# Patient Record
Sex: Male | Born: 1940 | Race: White | Hispanic: No | Marital: Married | Smoking: Current every day smoker
Health system: Southern US, Community
[De-identification: ages and names within clinical notes are randomized; demographics above are authoritative.]

## PROBLEM LIST (undated history)

## (undated) DIAGNOSIS — K5790 Diverticulosis of intestine, part unspecified, without perforation or abscess without bleeding: Secondary | ICD-10-CM

## (undated) DIAGNOSIS — I2510 Atherosclerotic heart disease of native coronary artery without angina pectoris: Secondary | ICD-10-CM

## (undated) DIAGNOSIS — K635 Polyp of colon: Secondary | ICD-10-CM

## (undated) DIAGNOSIS — I5022 Chronic systolic (congestive) heart failure: Secondary | ICD-10-CM

## (undated) DIAGNOSIS — I255 Ischemic cardiomyopathy: Secondary | ICD-10-CM

## (undated) DIAGNOSIS — D126 Benign neoplasm of colon, unspecified: Secondary | ICD-10-CM

## (undated) DIAGNOSIS — K219 Gastro-esophageal reflux disease without esophagitis: Secondary | ICD-10-CM

## (undated) DIAGNOSIS — E785 Hyperlipidemia, unspecified: Secondary | ICD-10-CM

## (undated) DIAGNOSIS — H269 Unspecified cataract: Secondary | ICD-10-CM

## (undated) DIAGNOSIS — I6529 Occlusion and stenosis of unspecified carotid artery: Secondary | ICD-10-CM

## (undated) DIAGNOSIS — J449 Chronic obstructive pulmonary disease, unspecified: Secondary | ICD-10-CM

## (undated) DIAGNOSIS — N400 Enlarged prostate without lower urinary tract symptoms: Secondary | ICD-10-CM

## (undated) DIAGNOSIS — G2581 Restless legs syndrome: Secondary | ICD-10-CM

## (undated) DIAGNOSIS — Z860100 Personal history of colon polyps, unspecified: Secondary | ICD-10-CM

## (undated) DIAGNOSIS — I719 Aortic aneurysm of unspecified site, without rupture: Secondary | ICD-10-CM

## (undated) DIAGNOSIS — I509 Heart failure, unspecified: Secondary | ICD-10-CM

## (undated) DIAGNOSIS — M359 Systemic involvement of connective tissue, unspecified: Secondary | ICD-10-CM

## (undated) DIAGNOSIS — R011 Cardiac murmur, unspecified: Secondary | ICD-10-CM

## (undated) DIAGNOSIS — I10 Essential (primary) hypertension: Secondary | ICD-10-CM

## (undated) HISTORY — DX: Chronic obstructive pulmonary disease, unspecified: J44.9

## (undated) HISTORY — DX: Diverticulosis of intestine, part unspecified, without perforation or abscess without bleeding: K57.90

## (undated) HISTORY — DX: Atherosclerotic heart disease of native coronary artery without angina pectoris: I25.10

## (undated) HISTORY — PX: HERNIA REPAIR: SHX51

## (undated) HISTORY — DX: Hyperlipidemia, unspecified: E78.5

## (undated) HISTORY — DX: Benign prostatic hyperplasia without lower urinary tract symptoms: N40.0

## (undated) HISTORY — DX: Benign neoplasm of colon, unspecified: D12.6

## (undated) HISTORY — DX: Polyp of colon: K63.5

## (undated) HISTORY — DX: Heart failure, unspecified: I50.9

## (undated) HISTORY — DX: Personal history of colon polyps, unspecified: Z86.0100

## (undated) HISTORY — DX: Unspecified cataract: H26.9

## (undated) HISTORY — PX: CORONARY ANGIOPLASTY: SHX604

## (undated) HISTORY — DX: Essential (primary) hypertension: I10

## (undated) HISTORY — DX: Cardiac murmur, unspecified: R01.1

## (undated) HISTORY — PX: CHOLECYSTECTOMY: SHX55

## (undated) HISTORY — DX: Aortic aneurysm of unspecified site, without rupture: I71.9

## (undated) HISTORY — DX: Personal history of colonic polyps: Z86.010

## (undated) HISTORY — DX: Gastro-esophageal reflux disease without esophagitis: K21.9

---

## 1998-08-12 ENCOUNTER — Encounter: Payer: Self-pay | Admitting: General Surgery

## 1998-08-14 ENCOUNTER — Ambulatory Visit (HOSPITAL_COMMUNITY): Admission: RE | Admit: 1998-08-14 | Discharge: 1998-08-14 | Payer: Self-pay | Admitting: General Surgery

## 2001-02-21 ENCOUNTER — Encounter: Payer: Self-pay | Admitting: Family Medicine

## 2001-02-21 ENCOUNTER — Encounter: Payer: Self-pay | Admitting: Gastroenterology

## 2001-02-22 ENCOUNTER — Encounter: Payer: Self-pay | Admitting: Internal Medicine

## 2001-02-22 ENCOUNTER — Inpatient Hospital Stay (HOSPITAL_COMMUNITY): Admission: EM | Admit: 2001-02-22 | Discharge: 2001-02-28 | Payer: Self-pay | Admitting: Emergency Medicine

## 2001-02-22 ENCOUNTER — Encounter (INDEPENDENT_AMBULATORY_CARE_PROVIDER_SITE_OTHER): Payer: Self-pay | Admitting: *Deleted

## 2001-02-25 ENCOUNTER — Encounter: Payer: Self-pay | Admitting: General Surgery

## 2008-04-02 ENCOUNTER — Ambulatory Visit: Payer: PRIVATE HEALTH INSURANCE | Admitting: Ophthalmology

## 2008-04-09 ENCOUNTER — Ambulatory Visit: Payer: Self-pay | Admitting: Vascular Surgery

## 2008-04-17 ENCOUNTER — Ambulatory Visit: Payer: Self-pay | Admitting: Vascular Surgery

## 2008-04-17 ENCOUNTER — Encounter: Payer: Self-pay | Admitting: Vascular Surgery

## 2008-04-17 ENCOUNTER — Inpatient Hospital Stay (HOSPITAL_COMMUNITY): Admission: RE | Admit: 2008-04-17 | Discharge: 2008-04-18 | Payer: Self-pay | Admitting: Vascular Surgery

## 2008-04-17 HISTORY — PX: CAROTID ENDARTERECTOMY: SUR193

## 2008-05-07 ENCOUNTER — Encounter (INDEPENDENT_AMBULATORY_CARE_PROVIDER_SITE_OTHER): Payer: Self-pay | Admitting: *Deleted

## 2008-05-07 ENCOUNTER — Ambulatory Visit: Payer: Self-pay | Admitting: Vascular Surgery

## 2008-05-07 ENCOUNTER — Ambulatory Visit: Payer: Self-pay | Admitting: Family Medicine

## 2008-05-07 DIAGNOSIS — R519 Headache, unspecified: Secondary | ICD-10-CM | POA: Insufficient documentation

## 2008-05-07 DIAGNOSIS — Z860100 Personal history of colon polyps, unspecified: Secondary | ICD-10-CM | POA: Insufficient documentation

## 2008-05-07 DIAGNOSIS — I6523 Occlusion and stenosis of bilateral carotid arteries: Secondary | ICD-10-CM | POA: Insufficient documentation

## 2008-05-07 DIAGNOSIS — F17200 Nicotine dependence, unspecified, uncomplicated: Secondary | ICD-10-CM

## 2008-05-07 DIAGNOSIS — I739 Peripheral vascular disease, unspecified: Secondary | ICD-10-CM

## 2008-05-07 DIAGNOSIS — B351 Tinea unguium: Secondary | ICD-10-CM

## 2008-05-07 DIAGNOSIS — E785 Hyperlipidemia, unspecified: Secondary | ICD-10-CM | POA: Insufficient documentation

## 2008-05-07 DIAGNOSIS — A630 Anogenital (venereal) warts: Secondary | ICD-10-CM

## 2008-05-07 DIAGNOSIS — R51 Headache: Secondary | ICD-10-CM

## 2008-05-07 DIAGNOSIS — Z8679 Personal history of other diseases of the circulatory system: Secondary | ICD-10-CM | POA: Insufficient documentation

## 2008-05-09 ENCOUNTER — Ambulatory Visit: Payer: Self-pay | Admitting: Family Medicine

## 2008-05-14 DIAGNOSIS — R7303 Prediabetes: Secondary | ICD-10-CM

## 2008-05-15 LAB — CONVERTED CEMR LAB
ALT: 15 units/L (ref 0–53)
AST: 17 units/L (ref 0–37)
Albumin: 3.8 g/dL (ref 3.5–5.2)
Alkaline Phosphatase: 68 units/L (ref 39–117)
BUN: 18 mg/dL (ref 6–23)
Bilirubin, Direct: 0.1 mg/dL (ref 0.0–0.3)
CO2: 29 meq/L (ref 19–32)
Calcium: 9 mg/dL (ref 8.4–10.5)
Chloride: 105 meq/L (ref 96–112)
Cholesterol: 276 mg/dL (ref 0–200)
Creatinine, Ser: 0.8 mg/dL (ref 0.4–1.5)
Direct LDL: 176.9 mg/dL
GFR calc Af Amer: 124 mL/min
GFR calc non Af Amer: 103 mL/min
Glucose, Bld: 106 mg/dL — ABNORMAL HIGH (ref 70–99)
HDL: 36.2 mg/dL — ABNORMAL LOW (ref >39.0)
PSA: 0.83 ng/mL (ref 0.10–4.00)
Potassium: 4.1 meq/L (ref 3.5–5.1)
Sodium: 140 meq/L (ref 135–145)
Total Bilirubin: 0.5 mg/dL (ref 0.3–1.2)
Total CHOL/HDL Ratio: 7.6
Total Protein: 6.8 g/dL (ref 6.0–8.3)
Triglycerides: 195 mg/dL — ABNORMAL HIGH (ref 0–149)
VLDL: 39 mg/dL (ref 0–40)

## 2008-05-30 ENCOUNTER — Ambulatory Visit: Payer: Self-pay | Admitting: Vascular Surgery

## 2008-05-30 ENCOUNTER — Encounter: Payer: Self-pay | Admitting: Vascular Surgery

## 2008-05-30 ENCOUNTER — Inpatient Hospital Stay (HOSPITAL_COMMUNITY): Admission: RE | Admit: 2008-05-30 | Discharge: 2008-05-31 | Payer: Self-pay | Admitting: Vascular Surgery

## 2008-05-30 HISTORY — PX: CAROTID ENDARTERECTOMY: SUR193

## 2008-06-05 ENCOUNTER — Telehealth: Payer: Self-pay | Admitting: Gastroenterology

## 2008-06-05 ENCOUNTER — Ambulatory Visit: Payer: Self-pay | Admitting: Family Medicine

## 2008-06-05 DIAGNOSIS — D126 Benign neoplasm of colon, unspecified: Secondary | ICD-10-CM

## 2008-06-05 DIAGNOSIS — N400 Enlarged prostate without lower urinary tract symptoms: Secondary | ICD-10-CM | POA: Insufficient documentation

## 2008-06-05 LAB — CONVERTED CEMR LAB
Cholesterol, target level: 200 mg/dL
HDL goal, serum: 40 mg/dL
LDL Goal: 100 mg/dL

## 2008-06-11 ENCOUNTER — Ambulatory Visit: Payer: Self-pay | Admitting: Vascular Surgery

## 2008-06-19 ENCOUNTER — Telehealth: Payer: Self-pay | Admitting: Family Medicine

## 2008-06-20 ENCOUNTER — Ambulatory Visit: Payer: Self-pay | Admitting: Gastroenterology

## 2008-06-20 DIAGNOSIS — J449 Chronic obstructive pulmonary disease, unspecified: Secondary | ICD-10-CM

## 2008-06-24 ENCOUNTER — Encounter: Payer: Self-pay | Admitting: Family Medicine

## 2008-06-26 ENCOUNTER — Ambulatory Visit: Payer: Self-pay | Admitting: Gastroenterology

## 2008-06-26 ENCOUNTER — Encounter: Payer: Self-pay | Admitting: Gastroenterology

## 2008-07-01 ENCOUNTER — Encounter: Payer: Self-pay | Admitting: Gastroenterology

## 2008-08-16 ENCOUNTER — Ambulatory Visit: Payer: Self-pay | Admitting: Family Medicine

## 2008-08-20 LAB — CONVERTED CEMR LAB
ALT: 15 units/L (ref 0–53)
AST: 18 units/L (ref 0–37)
Cholesterol: 114 mg/dL (ref 0–200)
HDL: 38.4 mg/dL — ABNORMAL LOW (ref >39.00)
LDL Cholesterol: 46 mg/dL (ref 0–99)
Total CHOL/HDL Ratio: 3
Triglycerides: 146 mg/dL (ref 0.0–149.0)
VLDL: 29.2 mg/dL (ref 0.0–40.0)

## 2008-08-22 ENCOUNTER — Ambulatory Visit: Payer: Self-pay | Admitting: Family Medicine

## 2008-12-12 ENCOUNTER — Ambulatory Visit: Payer: Self-pay | Admitting: Vascular Surgery

## 2009-02-25 ENCOUNTER — Ambulatory Visit: Payer: Self-pay | Admitting: Family Medicine

## 2009-02-28 LAB — CONVERTED CEMR LAB
Alkaline Phosphatase: 74 units/L (ref 39–117)
Bilirubin, Direct: 0 mg/dL (ref 0.0–0.3)
CO2: 29 meq/L (ref 19–32)
Calcium: 8.8 mg/dL (ref 8.4–10.5)
GFR calc non Af Amer: 89.27 mL/min (ref >60)
HDL: 37.5 mg/dL — ABNORMAL LOW (ref >39.00)
LDL Cholesterol: 57 mg/dL (ref 0–99)
Sodium: 139 meq/L (ref 135–145)
Total Bilirubin: 0.5 mg/dL (ref 0.3–1.2)
Total CHOL/HDL Ratio: 4
VLDL: 37.6 mg/dL (ref 0.0–40.0)

## 2009-03-18 ENCOUNTER — Ambulatory Visit: Payer: Self-pay | Admitting: Family Medicine

## 2009-09-01 ENCOUNTER — Telehealth: Payer: Self-pay | Admitting: Family Medicine

## 2009-12-03 ENCOUNTER — Ambulatory Visit: Payer: Self-pay | Admitting: Vascular Surgery

## 2010-04-16 NOTE — Progress Notes (Signed)
Summary: needs 90 day script for proscar  Phone Note Refill Request Message from:  wife  Refills Requested: Medication #1:  FINASTERIDE 5 MG TABS 1 tab by mouth daily. Please send to [facility].  Initial call taken by: Lowella Petties CMA,  September 01, 2009 4:46 PM    Prescriptions: FINASTERIDE 5 MG TABS (FINASTERIDE) 1 tab by mouth daily  #90 x 3   Entered and Authorized by:   Hannah Beat MD   Signed by:   Hannah Beat MD on 09/01/2009   Method used:   Faxed to ...       [facility] (mail-order)             , [place]         Ph:        Fax: [phone]   RxID:   [id number]

## 2010-04-16 NOTE — Assessment & Plan Note (Signed)
Summary: ROA CYD   Vital Signs:  Patient profile:   70 year old male Height:      67 inches Weight:      142.4 pounds BMI:     22.38 Temp:     98.1 degrees F oral Pulse rate:   76 / minute Pulse rhythm:   regular BP sitting:   90 / 60  (left arm) Cuff size:   regular  Vitals Entered By: Benny Lennert CMA Duncan Dull) (March 18, 2009 12:09 PM)  History of Present Illness: Chief complaint Roa 6 months   BPH, improved on finastreride and flomax...only 1 episode a night of urination. No urinary retention.   Lipid Management History:      Positive NCEP/ATP III risk factors include male age 23 years old or older, HDL cholesterol less than 40, current tobacco user, and peripheral vascular disease.        The patient states that he knows about the "Therapeutic Lifestyle Change" diet.  His compliance with the TLC diet is fair.  The patient expresses understanding of adjunctive measures for cholesterol lowering.  Adjunctive measures started by the patient include aerobic exercise, fiber, and limit alcohol consumpton.  He expresses no side effects from his lipid-lowering medication.  The patient denies any symptoms to suggest myopathy or liver disease.     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 Take 1 Tablet By Mouth Once A Day 2)  Flomax 0.4 Mg Xr24h-Cap (Tamsulosin Hcl) .Marland Kitchen.. 1 Tab By Mouth Daily 3)  Aspirin 81 Mg  Tabs (Aspirin) .... Take 1 Tablet By Mouth Once A Day 4)  Finasteride 5 Mg Tabs (Finasteride) .Marland Kitchen.. 1 Tab By Mouth Daily  Allergies: 1)  ! Codeine  Past History:  Past medical, surgical, family and social histories (including risk factors) reviewed, and no changes noted (except as noted below).  Past Medical History: Reviewed history from 05/07/2008 and no changes required. Current Problems:  HEART MURMUR, HX OF (ICD-V12.50) VENEREAL WART (ICD-078.11) HYPERLIPIDEMIA (ICD-272.4) HEADACHE (ICD-784.0) COLONIC POLYPS, HX OF (ICD-V12.72)    Past Surgical History: Reviewed history from 05/07/2008 and no changes required. Hernia repair Colonoscopy (colon punctured during procedure Carotid Endarterectomy Cholecystectomy  Family History: Reviewed history from 05/07/2008 and no changes required. Family History of Arthritis, other blood relative Family History High cholesterol, other blood relative Family History Hypertension, other blood relative Family History of Heart Disease, brother, MI age 79.  Social History: Reviewed history from 06/20/2008 and no changes required. Current smoker 40 year history 1 and 1/2 a day, has decrease to 1/2 a pack a day.  Alcohol use-no Drug use-no Regular  exercise-no Sleeps 6-7 hours per night 2 people living at the residence Occupation: Retired Smoking Status:  current  Review of Systems General:  Denies chills, fatigue, and fever. CV:  Denies chest pain or discomfort. Resp:  Denies shortness of breath. GI:  Denies abdominal pain, bloody stools, constipation, and diarrhea. GU:  Denies dysuria and  urinary frequency.  Physical Exam  General:  thin appearing male in NAd, smells of smoke Mouth:  Oral mucosa and oropharynx without lesions or exudates.  Teeth in good repair. Neck:  well healing scars over b carotids, no bruit, no thyromegaly Lungs:  Normal respiratory effort, chest expands symmetrically. Lungs are clear to auscultation, no crackles or wheezes, slightly tympanic Heart:  Normal rate and regular rhythm. S1 and S2 normal without gallop, murmur, click, rub or other extra sounds. Pulses:  2 plus post tibilais Extremities:  No clubbing, cyanosis, edema, or deformity noted with normal full range of motion of all joints.     Impression & Recommendations:  Problem # 1:  HYPERLIPIDEMIA (ICD-272.4) Well controlled except triglycerides elevated..will start fish oil daily to decrease CV risk as well. Recheck fasting lipids, AST, ALT  in 6 months  His updated medication list for this problem includes:    Vytorin 10-40 Mg Tabs (Ezetimibe-simvastatin) .Marland Kitchen... Take 1 tablet by mouth once a day  Problem # 2:  BENIGN PROSTATIC HYPERTROPHY, WITH URINARY OBSTRUCTION (ICD-600.01) Improved symtpoms on finasteride and flowmax.He is  pleased wuth results.  Problem # 3:  PREDIABETES (ICD-790.29) Improving..continue work on decreasing carbohydrates and concentrated sweets. increase fiber in foods.  Increase exercsie as tolerated.  Problem # 4:  CAROTID ARTERY STENOSIS, BILATERAL (ICD-433.10) Has CAD risk equivalent.. EKG last year stable and no chest pain, but pt exerts himself minimally. Recommended eval of heart with stress test to make sure no blockage given high risk.Traven Davids not interested in this time.  Counsled again x 3 min on smoking cessation..he is precontemplative.  His updated medication list for this problem includes:    Aspirin 81 Mg Tabs (Aspirin) .Marland Kitchen... Take 1 tablet by mouth once a day  Complete Medication List: 1)  Vytorin 10-40 Mg Tabs (Ezetimibe-simvastatin) ....  Take 1 tablet by mouth once a day 2)  Flomax 0.4 Mg Xr24h-cap (Tamsulosin hcl) .Marland Kitchen.. 1 tab by mouth daily 3)  Aspirin 81 Mg Tabs (Aspirin) .... Take 1 tablet by mouth once a day 4)  Finasteride 5 Mg Tabs (Finasteride) .Marland Kitchen.. 1 tab by mouth daily  Lipid Assessment/Plan:      Based on NCEP/ATP III, the patient's risk factor category is "history of coronary disease, peripheral vascular disease, cerebrovascular disease, or aortic aneurysm along with either diabetes, current smoker, or LDL > 130 plus HDL < 40 plus triglycerides > 200".  The patient's lipid goals are as follows: Total cholesterol goal is 200; LDL cholesterol goal is 100; HDL cholesterol goal is 40; Triglyceride goal is 150.  His LDL cholesterol goal has been met.    Patient Instructions: 1)  Fish oil (omega 3 Fatty ACIDs) 2000 mg divided daily. 2)  Lemon/orange.   3)  Please schedule a follow-up appointment in 6 months  CPX. 4)  Fasting lipids, CMET 6 months.   Current Allergies (reviewed today): ! CODEINE

## 2010-06-25 LAB — CBC
HCT: 30.2 % — ABNORMAL LOW (ref 39.0–52.0)
HCT: 41.2 % (ref 39.0–52.0)
Hemoglobin: 14 g/dL (ref 13.0–17.0)
Platelets: 319 10*3/uL (ref 150–400)
WBC: 9.3 10*3/uL (ref 4.0–10.5)
WBC: 9.8 10*3/uL (ref 4.0–10.5)

## 2010-06-25 LAB — COMPREHENSIVE METABOLIC PANEL
Alkaline Phosphatase: 99 U/L (ref 39–117)
BUN: 13 mg/dL (ref 6–23)
Chloride: 102 mEq/L (ref 96–112)
Glucose, Bld: 107 mg/dL — ABNORMAL HIGH (ref 70–99)
Potassium: 4.5 mEq/L (ref 3.5–5.1)
Total Bilirubin: 0.5 mg/dL (ref 0.3–1.2)

## 2010-06-25 LAB — PROTIME-INR
INR: 1.1 (ref 0.00–1.49)
Prothrombin Time: 14.1 seconds (ref 11.6–15.2)

## 2010-06-25 LAB — URINALYSIS, ROUTINE W REFLEX MICROSCOPIC
Bilirubin Urine: NEGATIVE
Hgb urine dipstick: NEGATIVE
Protein, ur: NEGATIVE mg/dL
Urobilinogen, UA: 0.2 mg/dL (ref 0.0–1.0)

## 2010-06-25 LAB — TYPE AND SCREEN: Antibody Screen: NEGATIVE

## 2010-06-25 LAB — BASIC METABOLIC PANEL
Calcium: 7.9 mg/dL — ABNORMAL LOW (ref 8.4–10.5)
Creatinine, Ser: 0.75 mg/dL (ref 0.4–1.5)
GFR calc Af Amer: >60 mL/min (ref >60)

## 2010-06-26 ENCOUNTER — Other Ambulatory Visit: Payer: Self-pay | Admitting: Family Medicine

## 2010-06-29 LAB — URINALYSIS, ROUTINE W REFLEX MICROSCOPIC
Bilirubin Urine: NEGATIVE
Glucose, UA: NEGATIVE mg/dL
Hgb urine dipstick: NEGATIVE
Ketones, ur: NEGATIVE mg/dL
Protein, ur: NEGATIVE mg/dL

## 2010-06-29 LAB — COMPREHENSIVE METABOLIC PANEL
ALT: 33 U/L (ref 0–53)
Alkaline Phosphatase: 78 U/L (ref 39–117)
CO2: 24 mEq/L (ref 19–32)
Calcium: 9.3 mg/dL (ref 8.4–10.5)
Chloride: 104 mEq/L (ref 96–112)
GFR calc non Af Amer: >60 mL/min (ref >60)
Glucose, Bld: 107 mg/dL — ABNORMAL HIGH (ref 70–99)
Sodium: 137 mEq/L (ref 135–145)
Total Bilirubin: 0.4 mg/dL (ref 0.3–1.2)

## 2010-06-29 LAB — ABO/RH: ABO/RH(D): A POS

## 2010-06-29 LAB — CBC
Hemoglobin: 14.2 g/dL (ref 13.0–17.0)
MCHC: 33 g/dL (ref 30.0–36.0)
RBC: 4.9 MIL/uL (ref 4.22–5.81)

## 2010-06-29 LAB — PROTIME-INR: Prothrombin Time: 12.7 seconds (ref 11.6–15.2)

## 2010-06-30 LAB — CBC
HCT: 31.7 % — ABNORMAL LOW (ref 39.0–52.0)
MCV: 86.2 fL (ref 78.0–100.0)
RBC: 3.68 MIL/uL — ABNORMAL LOW (ref 4.22–5.81)
WBC: 11.3 10*3/uL — ABNORMAL HIGH (ref 4.0–10.5)

## 2010-06-30 LAB — BASIC METABOLIC PANEL
Chloride: 102 mEq/L (ref 96–112)
GFR calc Af Amer: >60 mL/min (ref >60)
GFR calc non Af Amer: >60 mL/min (ref >60)
Potassium: 3.8 mEq/L (ref 3.5–5.1)
Sodium: 134 mEq/L — ABNORMAL LOW (ref 135–145)

## 2010-07-28 NOTE — Procedures (Signed)
CAROTID DUPLEX EXAM   INDICATION:  Follow up carotid endarterectomies.   HISTORY:  Diabetes:  No.  Cardiac:  No.  Hypertension:  No.  Smoking:  Yes.  Previous Surgery:  Right carotid endarterectomy on 04/17/2008, left  carotid endarterectomy on 05/30/2008.  CV History:  Currently asymptomatic.  Amaurosis Fugax No, Paresthesias No, Hemiparesis No                                       RIGHT             LEFT  Brachial systolic pressure:         140               142  Brachial Doppler waveforms:         Normal            Normal  Vertebral direction of flow:        Antegrade         Antegrade  DUPLEX VELOCITIES (cm/sec)  CCA peak systolic                   75                83  ECA peak systolic                   124               82  ICA peak systolic                   93                97  ICA end diastolic                   30                23  PLAQUE MORPHOLOGY:                  Heterogeneous     Heterogeneous  PLAQUE AMOUNT:                      Minimal           Minimal  PLAQUE LOCATION:                    CCA               CCA   IMPRESSION:  1. Patent bilateral carotid endarterectomy sites.  No bilateral      internal carotid stenoses noted.  2. No significant change noted when compared to the previous exam on      12/12/2008.   ___________________________________________  [name], M.D.   CH/MEDQ  D:  12/04/2009  T:  12/04/2009  Job:  [id number]

## 2010-07-28 NOTE — Discharge Summary (Signed)
NAMETODD, JELINSKI NO.:  [id number]   MEDICAL RECORD NO.:  [id number]          PATIENT TYPE:  INP   LOCATION:  3306                         FACILITY:  [facility]   PHYSICIAN:  [name], M.D.DATE OF BIRTH:  [birth year]   DATE OF ADMISSION:  04/17/2008  DATE OF DISCHARGE:  04/18/2008                               DISCHARGE SUMMARY   REASON FOR ADMISSION:  Symptomatic right carotid stenosis.   HISTORY:  This is a pleasant 70 year old gentleman who was referred by  Dr. Druscilla Brownie.  He had had an episode of amaurosis fugax in the right  eye.  This prompted duplex scan, which showed bilateral severe carotid  stenoses.  Right carotid endarterectomy was recommended in order to  lower his risk of stroke.  The right side was addressed first as this  was symptomatic.  It was felt that he would ultimately require staged  left carotid endarterectomy in approximately 8 weeks given the severity  of the stenosis.   His past medical history is significant for hypercholesterolemia and a  history of tobacco abuse.   The remainder of his history and physical is as dictated without  addition or deletion.   HOSPITAL COURSE:  The patient was admitted on April 17, 2008, and  underwent uneventful right carotid endarterectomy with Dacron patch  angioplasty.  He had a severe 90% right carotid stenosis with calcific  plaque.  Postoperatively, he awoke neurologically intact.  He was  monitored in the recovery room and then transferred to the step-down  unit.  He did require dopamine initially to maintain a systolic blood  pressure above 100.  This was weaned off by postoperative day #1.  By  postop day #1, he was ambulating, voiding, and eating without  difficulty.  He was neurologically intact.  The incision looked fine and  he was discharged to home.   DISCHARGE DIAGNOSIS:  Symptomatic right carotid stenosis.   PROCEDURES:  Right carotid endarterectomy with Dacron patch  angioplasty  on April 17, 2008.   SECONDARY DIAGNOSES:  Tobacco abuse and hypercholesterolemia.   Discharge is to home, condition is good, and followup is in 2 weeks.      [name], M.D.  Electronically Signed     CSD/MEDQ  D:  04/18/2008  T:  04/18/2008  Job:  [id number]   cc:   [name], MD  Dr. Druscilla Brownie

## 2010-07-28 NOTE — Assessment & Plan Note (Signed)
OFFICE VISIT   Erik [name], Erik [name]  DOB:  Jul 31, 1940                                       12/12/2008  CHART#:[id number]   I saw the patient in the office today for followup of his carotid  disease.  He underwent staged carotid endarterectomies in March of this  year.  He had presented with an episode of amaurosis fugax in the right  eye and was found to have severe bilateral carotid disease.  He  underwent right carotid endarterectomy on 04/17/2008 and then subsequent  left carotid endarterectomy on 05/30/2008.  He comes in for a 6 month  followup visit.  Since I saw him last he has had no history of stroke,  TIAs, expressive or receptive aphasia, or amaurosis fugax.   REVIEW OF SYSTEMS:  He has had no recent chest pain, chest pressure,  palpitations or arrhythmias.  He has had no productive cough,  bronchitis, asthma or wheezing.   PHYSICAL EXAMINATION:  This is a pleasant 70 year old gentleman who  appears his stated age.  Blood pressure is 132/79, heart rate is 84.  Neck incisions are healed nicely.  I do not detect any carotid bruits.  Lungs are clear bilaterally to auscultation.  On cardiac exam he has a  regular rate and rhythm.  Neurologic exam is nonfocal.   Carotid duplex scan shows less than 39% carotid stenosis bilaterally.  There is no evidence of restenosis.  Both vertebral arteries are patent  with normally directed flow.   Overall I am pleased with his progress.  I have recommended a followup  duplex scan in 1 year.  I will see him back at that time.  He knows to  call sooner if he has problems.  In the meantime he knows to continue  taking his aspirin.   [name], M.D.  Electronically Signed   CSD/MEDQ  D:  12/12/2008  T:  12/13/2008  Job:  [id number]

## 2010-07-28 NOTE — Procedures (Signed)
CAROTID DUPLEX EXAM   INDICATION:  Follow up carotid artery disease.   HISTORY:  Diabetes:  No.  Cardiac:  No.  Hypertension:  No.  Smoking:  Yes.  Previous Surgery:  No.  CV History:  No.  Amaurosis Fugax Yes, Paresthesias No, Hemiparesis No.                                       RIGHT             LEFT  Brachial systolic pressure:         180               172  Brachial Doppler waveforms:         WNL               WNL  Vertebral direction of flow:        Antegrade         Antegrade  DUPLEX VELOCITIES (cm/sec)  CCA peak systolic                   48                70  ECA peak systolic                   146               185  ICA peak systolic                   513               502  ICA end diastolic                   182               153  PLAQUE MORPHOLOGY:                  Mixed             Mixed  PLAQUE AMOUNT:                      Severe            Severe  PLAQUE LOCATION:                    Proximal ICA      Proximal ICA   IMPRESSION:  Bilateral internal carotid arteries show evidence of 80-99%  stenosis.   ___________________________________________  [name], M.D.   AS/MEDQ  D:  04/09/2008  T:  04/09/2008  Job:  [id number]

## 2010-07-28 NOTE — Letter (Signed)
May 07, 2008   [name], MD  [address]  [place], [place] [address]   Re:  Erik [name], Erik [name]             DOB:  05/04/1940   Dear Dr. Ermalene Searing,   I saw Erik [name] in the office today for followup after his recent  right carotid endarterectomy.  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   Duplex showed bilateral high-grade carotid stenoses, and the plan was  for staged carotid endarterectomies.  He had the right carotid  endarterectomy with Dacron patch angioplasty on 04/17/08.  He comes in  today for his first follow-up visit.  He is doing quite well and has no  specific complaints.  He has had no focal weakness or paresthesias.  He  has had no further visual disturbances.   PHYSICAL EXAMINATION:  He has a left carotid bruit.  His right neck  incision is healing nicely.  Lungs are clear bilaterally to  auscultation.  On cardiac exam, he has a regular rate and rhythm.  Abdomen is soft and nontender.  He has palpable femoral, dorsalis pedis,  and posterior tibial pulses bilaterally.   I plan on addressing the left carotid stenosis on 03/18.  He has a  greater than 80% left carotid stenosis, and we have recommended left  carotid endarterectomy in order to lower his risk of his stroke.   I did receive a letter concerning the possible need for ABIs or a  Cardiolite.  Currently, he does have some mild claudication symptoms,  but his symptoms are quite tolerable, and he has palpable pedal pulses.   At this point, from my standpoint, he would not require ABIs unless his  symptoms progress significantly.  With respect to potentially needing a  Cardiolite in the future, I will leave that to your discretion.  He  tolerated his previous surgery well, and he has had no recent cardiac  symptoms.   Thanks for allowing me to participate in this  nice patient's care.   [name], M.D.  Electronically Signed   CSD/MEDQ  D:  05/07/2008  T:  05/08/2008  Job:  [id number]

## 2010-07-28 NOTE — Op Note (Signed)
NAMEFRISCO, CORDTS NO.:  [id number]   MEDICAL RECORD NO.:  [id number]          PATIENT TYPE:  INP   LOCATION:  3306                         FACILITY:  [facility]   PHYSICIAN:  [name], M.D.DATE OF BIRTH:  [birth year]   DATE OF PROCEDURE:  05/30/2008  DATE OF DISCHARGE:                               OPERATIVE REPORT   PREOPERATIVE DIAGNOSIS:  Asymptomatic greater than 80% left carotid  stenosis.   POSTOPERATIVE DIAGNOSIS:  Asymptomatic greater than 80% left carotid  stenosis.   PROCEDURE:  Left carotid endarterectomy with Dacron patch angioplasty.   INDICATIONS:  This is a 70 year old gentleman, who I had seen in  consultation in January after an episode of amaurosis fugax in the right  eye.  Duplex scanning showed severe bilateral carotid disease.  He  underwent right carotid endarterectomy on April 17, 2008, for the  symptomatic right carotid stenosis.  He did well from that standpoint  and is brought back for a staged left carotid endarterectomy for the  tight left carotid stenosis.   TECHNIQUE:  The patient was taken to the operating room and received a  general anesthetic.  Arterial line had been placed by Anesthesia.  The  left neck and chest were prepped and draped in the usual sterile  fashion.  After the incision was made along the anterior border of the  sternocleidomastoid, the dissection was carried down to the common  carotid artery, which was dissected free and controlled with a Rumel  tourniquet.  The facial vein was divided between 2-0 silk ties and then  the internal carotid artery was controlled above the plaque with a red  vessel loop.  The external carotid artery and superior thyroid arteries  were controlled.  The patient was then heparinized.  Clamps were then  placed on the internal, then the external within the common carotid  artery.  A longitudinal arteriotomy was made in the common carotid  artery and this was extended  to the plaque into the normal internal  carotid artery.  A 12 shunt was placed into the internal carotid artery  back bled and then placed into the common carotid artery and secured  with Rumel tourniquet.  Flow was reestablished to the shunt.  An  endarterectomy plane was established proximally and the plaque was  sharply divided.  Eversion endarterectomy was performed of the external  carotid artery.  Distally, there was a nice taper in the plaque.  One  tacking suture was placed.  The artery was irrigated with copious  amounts of heparin and dextran, and all loose debris removed.  The  Dacron patch was then sewn using continuous 6-0 Prolene suture.  Prior  to completing the patch closure, the artery was back bled and flushed  appropriately and the anastomosis completed.  Flow was reestablished  first to the external carotid artery and into the internal carotid  artery.  At the completion, there was a good pulse distal to the patch  and a good Doppler signal.  Hemostasis was obtained in the wound and the  heparin was partially reversed with protamine.  The wound  was closed with a deep layer of 3-0 Vicryl.  The platysma was closed  with running 3-0 Vicryl.  The skin was closed with a 4-0 subcuticular  stitch.  A sterile dressing was applied.  The patient tolerated the  procedure well and was transferred to recovery room in satisfactory  condition.  All needle and sponge counts were correct.      [name], M.D.  Electronically Signed     CSD/MEDQ  D:  05/30/2008  T:  05/30/2008  Job:  [id number]   cc:   [name], MD

## 2010-07-28 NOTE — H&P (Signed)
HISTORY AND PHYSICAL EXAMINATION   May 07, 2008   Re:  Erik, [name]             DOB:  07-27-1940   REASON FOR ADMISSION:  Greater than 80% left carotid stenosis.   HISTORY:  This is a pleasant 70 year old gentleman who I had seen  initially in consultation on January 26 concerning bilateral carotid  disease.  He had had an episode of amaurosis fugax in the right eye  which prompted a duplex scan which showed evidence of bilateral carotid  disease.  At that time he noted that in December he had had two episodes  of transient loss of vision in the right eye which lasted briefly and  occurred approximately a week apart.  Since that time he has had no  further episodes.  He was started on aspirin at that time.  Part of his  workup included a carotid duplex scan which was done at [facility] which showed severe bilateral carotid disease.  He had had no  previous history of stroke, TIAs, expressive or receptive aphasia or  amaurosis fugax.   He was admitted on 04/17/2008 to undergo right carotid endarterectomy  for symptomatic right carotid stenosis.  He did well postop and was  discharged on postop day #1.  On his followup visit 05/07/2008 he was  noted to be doing well with no new neurologic symptoms.  We have  arranged now for staged left carotid endarterectomy on March 18 which  will put him approximately 6-8 weeks since his right carotid  endarterectomy.   PAST MEDICAL HISTORY:  Is fairly unremarkable.  He denies any history of  diabetes, hypertension, history of previous myocardial infarction,  history of congestive heart failure or history of COPD.  He had been on  some cholesterol medicine in the past but this had caused some muscle  pain so he was no longer taking this.   FAMILY HISTORY:  His brother had a myocardial infarction at age 53.  He  is unaware of any other history of premature cardiovascular disease.   SOCIAL HISTORY:  He is  married and he has two children.  He had smoked  one to one and a half packs per day of cigarettes.  He began smoking  when he was 26.  He is currently trying to quit.   ALLERGIES:  Codeine.   MEDICATIONS:  Aspirin 325 mg p.o. daily.   REVIEW OF SYSTEMS:  GENERAL:  He has had no recent weight loss, weight  gain, problems with his appetite.  CARDIAC:  He has had no chest pain, chest pressure, palpitations or  arrhythmias.  PULMONARY:  He has had no productive cough, bronchitis, asthma or  wheezing.  GI:  He has had no recent change in his bowel habits and has no history  of peptic ulcer disease.  GU:  He has had no dysuria or frequency.  VASCULAR:  He does have some mild calf claudication bilaterally.  He has  had no history of rest pain and no history of nonhealing ulcers.  He  denies any history of previous DVT or phlebitis.  NEURO:  He has had no dizziness, blackouts, headaches or seizures.  He  occasionally gets headaches.  ORTHO:  He does occasionally get some joint pain.  HEENT:  There has been no change in his hearing recently.  He has had no  further visual disturbance since his initial episodes in December.  HEMATOLOGIC:  He has had  no bleeding problems or clotting disorders.   PHYSICAL EXAMINATION:  General:  This is a pleasant 70 year old  gentleman who appears his stated age.  Vital signs:  His blood pressure  is 120/73, heart rate is 106.  HEENT:  Is unremarkable.  Neck:  Supple.  His right neck incision has healed nicely.  He has a left carotid bruit.  Lungs:  Are clear bilaterally to auscultation.  Cardiac:  He has a  regular rate and rhythm.  Abdomen:  Soft and nontender.  I cannot  palpate an aneurysm.  He has palpable femoral pulses and posterior  tibial pulses bilaterally.  He has weakly palpable dorsalis pedis  pulses.  Both feet are warm and well-perfused without ischemic ulcers.  He has no significant lower extremity swelling.  Neurologic:  Exam is   nonfocal.   Previous duplex scan had shown a greater than 80% left carotid stenosis.  End-diastolic velocity was 153 cm/sec.  He had a tighter stenosis on the  right which has been addressed.   I have recommended left carotid endarterectomy in order to lower his  risk of future stroke.  We have discussed the indications for the  procedure and the potential complications including but not limited to  bleeding, stroke (periprocedural risk 1-2%), nerve injury, MI or other  unpredictable medical problems.  All of his questions were answered.  He  was agreeable to proceed.  He knows to continue taking his aspirin right  up through surgery.   Erik [name]. Erik [name], M.D.  Electronically Signed   CSD/MEDQ  D:  05/07/2008  T:  05/08/2008  Job:  [id number]   cc:   Erik Nora, MD

## 2010-07-28 NOTE — Procedures (Signed)
CAROTID DUPLEX EXAM   INDICATION:  Postop CEA bilaterally 6 month followup.   HISTORY:  Diabetes:  No.  Cardiac:  No.  Hypertension:  No.  Smoking:  Yes.  Previous Surgery:  Bilateral CEAs.  CV History:  No.  Amaurosis Fugax Yes, Paresthesias No, Hemiparesis No                                       RIGHT             LEFT  Brachial systolic pressure:         140               136  Brachial Doppler waveforms:         Triphasic         Triphasic  Vertebral direction of flow:        Antegrade         Antegrade  DUPLEX VELOCITIES (cm/sec)  CCA peak systolic                   60                88  ECA peak systolic                   88                99  ICA peak systolic                   90                103  ICA end diastolic                   27                20  PLAQUE MORPHOLOGY:                  Smooth            Smooth  PLAQUE AMOUNT:                      Mild              Mild  PLAQUE LOCATION:                    CCA               CCA   IMPRESSION:  0-29% stenosis noted bilaterally.            ___________________________________________  [name], M.D.   CJ/MEDQ  D:  12/12/2008  T:  12/12/2008  Job:  [id number]

## 2010-07-28 NOTE — H&P (Signed)
HISTORY AND PHYSICAL EXAMINATION   April 09, 2008   Re:  Erik [name], Erik [name]             DOB:  [birth year]   A new patient consultation as well as a history and physical.  I saw the  patient in the office today in consultation concerning bilateral carotid  disease.  He was referred by Dr. Druscilla Brownie.  He had an episode of  amaurosis fugax in the right eye which prompted a duplex scan which  showed evidence of bilateral carotid disease.  He was sent to our office  for further evaluation.  Approximately a month ago he had two episodes  of transient loss of vision in the right eye which lasted briefly and  occurred approximately a week apart.  He has had no further episodes.  He was started on aspirin at that time.  Part of his workup for this  occluded a carotid duplex scan done at [facility] which showed  evidence of bilateral carotid disease and he was sent for vascular  consultation.  Of note, he is right-handed.  He denies any history of  stroke, TIAs, expressive or receptive aphasia or amaurosis fugax.   PAST MEDICAL HISTORY:  Is fairly unremarkable.  He denies any history of  diabetes, hypertension, history of previous myocardial infarction,  history of congestive heart failure, history of COPD.  He does state  that he has had high cholesterol in the past but was unable to take  Lipitor because of muscle pain.   FAMILY HISTORY:  He has a brother who had a myocardial infarction at age  85.  He is unaware of any other history of premature cardiovascular  disease.   SOCIAL HISTORY:  He is married.  He has two children.  He had smoked up  to one and a half packs per day of cigarettes and began smoking at 26.  He is currently trying to quit and is now down to less than 10  cigarettes a day.   ALLERGIES:  Codeine.   MEDICATIONS:  Aspirin 325 mg p.o. daily.   REVIEW OF SYSTEMS:  GENERAL:  He had no recent weight loss, weight gain,  problems with his  appetite.  WEIGHT/HEIGHT:  He is 138 pounds and 5 feet 7 inches tall.  CARDIAC:  He has had no chest pain, chest pressure, palpitations or  arrhythmias.  He has had no jaw pain or arm pain.  He does admit to some  dyspnea on exertion.  PULMONARY:  He has had no productive cough, bronchitis, asthma or  wheezing.  GI:  He has had no change in his bowel habits and has no history of  peptic ulcer disease.  GU:  He has had some urinary frequency.  VASCULAR:  He admits to some mild calf claudication bilaterally.  He has  had no rest pain or nonhealing ulcers.  He has had no history of DVT or  phlebitis.  NEUROLOGICAL:  He has occasional headaches.  He has had no dizziness,  blackouts or seizures.  ORTHO:  He does have some occasional joint pain.  HEENT:  He has had the transient loss of vision in the right eye as  described above.  No change in his hearing.  HEMATOLOGIC:  He has had no bleeding problems or clotting disorders.   PHYSICAL EXAMINATION:  General:  This is a pleasant 70 year old  gentleman who appears his stated age.  HEENT:  Unremarkable.  Neck:  Is  supple.  There is no cervical lymphadenopathy.  He has bilateral carotid  bruits.  Lungs:  Are clear bilaterally to auscultation.  Cardiac:  He  has a regular rate and rhythm.  Abdomen:  Soft and nontender.  I cannot  palpate an aneurysm.  There are normal pitched bowel sounds.  He has  palpable femoral and posterior tibial pulses bilaterally.  Both feet are  warm and well-perfused without ischemic ulcers.  He has no significant  lower extremity swelling.  Neurologic:  He has good strength in his  upper extremities and lower extremities bilaterally.   Carotid duplex scan in our office shows greater than 80% carotid  stenoses bilaterally.  End-diastolic velocity on the right is 182 cm per  second and on the left 153 cm per second.   I have recommended staged bilateral carotid endarterectomies.  As the  right side has been  symptomatic I recommended we address this first.  He  would likely then proceed with left carotid endarterectomy in 6-8 weeks.  He is on aspirin and knows to continue this through surgery.  We have  discussed the indications for surgery and that is to lower his risk of  future stroke.  We have also discussed the potential complications of  surgery including but not limited to bleeding, stroke (periprocedural  risk 1-2%), MI, nerve injury or other unpredictable medical problems.  All of his questions were answered and he is agreeable to proceed.  His  surgery has been scheduled for February 3.   [name], M.D.  Electronically Signed   CSD/MEDQ  D:  04/09/2008  T:  04/10/2008  Job:  [id number]   cc:   [name], MD

## 2010-07-28 NOTE — Op Note (Signed)
Erik [name], MARIO NO.:  [id number]   MEDICAL RECORD NO.:  [id number]          PATIENT TYPE:  INP   LOCATION:  3306                         FACILITY:  [facility]   PHYSICIAN:  [name], M.D.DATE OF BIRTH:  May 28, 1940   DATE OF PROCEDURE:  04/17/2008  DATE OF DISCHARGE:                               OPERATIVE REPORT   PREOPERATIVE DIAGNOSIS:  Symptomatic right carotid stenosis (amaurosis  fugax).   POSTOPERATIVE DIAGNOSIS:  Symptomatic right carotid stenosis (amaurosis  fugax).   PROCEDURE:  Right carotid endarterectomy with Dacron patch angioplasty.   SURGEON:  [name], MD   ASSISTANT:  Jerold Coombe, PA   ANESTHESIA:  General.   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  Duplex scan showed bilateral  high-grade carotid stenoses and stage carotid endarterectomies was  recommended.  The right side was elected to be done first as this was  symptomatic and the tighter of the two stenoses.  The procedure and  potential complications were discussed with the patient preoperatively.  All of his questions were answered and he was agreeable to proceed.   TECHNIQUE:  The patient was taken to the operating room and received a  general anesthetic.  The right neck and upper chest were prepped and  draped in the usual sterile fashion.  An incision was made along the  anterior border of the sternocleidomastoid and dissection carried down  to the common carotid artery which was dissected free and controlled  with a Rumel tourniquet.  The facial vein was divided between 2-0 silk  ties and then the internal carotid artery was controlled above the  plaque.  The external and superior thyroid arteries were also  controlled.  The patient was then heparinized with 5500 units of  heparin.  Clamps were then placed  on the internal, then the external,  then the common carotid artery.  A longitudinal arteriotomy was made in  the common carotid artery and this was extended through the plaque into  the internal carotid artery above the plaque.  A 12 shunt was placed  into the internal carotid artery, back-bled and then placed into the  common carotid artery and secured with Rumel tourniquet.  Flow was  reestablished through the shunt.  Next, an endarterectomy plane was  established proximally and the plaque was sharply divided.  Eversion  endarterectomy was performed of the external carotid artery.  Distally,  there was a nice taper in the plaque and no tacking sutures were  required.  The artery was irrigated with copious amounts of heparin and  dextran and all loose debris removed.  The Dacron patch was then sewn  using continuous 6-0 Prolene suture.  Prior to completing the patch  closure, the shunt was removed and the artery back-bled and flushed  appropriately and the anastomosis completed.  Flow was reestablished  first to the external carotid artery and then to the internal carotid  artery.  At the completion, there  was a good pulse distal to the patch.  Hemostasis was obtained in the wound and the heparin was partially  reversed with protamine.  The wound was  closed with a deep layer of 3-0 Vicryl, the platysma was closed with  running 3-0 Vicryl and the skin was closed with a 4-0 subcuticular  stitch.  A sterile dressing was applied.  The patient tolerated the  procedure well and awoke neurologically intact.  All needle and sponge  counts were correct.      [name], M.D.  Electronically Signed     CSD/MEDQ  D:  04/17/2008  T:  04/17/2008  Job:  [id number]   cc:   [name], MD

## 2010-07-28 NOTE — Assessment & Plan Note (Signed)
OFFICE VISIT   Tani, Ivery L  DOB:  [birth year]                                       06/11/2008  CHART#:[id number]   I saw the patient in followup today after his recent left carotid  endarterectomy.  This is a 70 year old gentleman who I had seen in  consultation after an episode of amaurosis fugax in the right eye.  Duplex scan showed evidence of bilateral carotid disease.  He underwent  a right carotid endarterectomy on February 3 for the symptomatic right  carotid stenosis and he was brought in on 03/18 for staged left carotid  endarterectomy.  He had surgery on 05/30/2008 and did well  postoperatively.  He was discharged on postop day #1.  He returns for  his first followup visit.  Overall he has been doing quite well and has  no specific complaints.  He has had no focal weakness or paresthesias.  He continues to take aspirin daily.   PHYSICAL EXAMINATION:  His incisions are both healing nicely.  Neurologic exam is nonfocal.   Overall I am pleased with his progress.  I will see him back in 6 months  for a followup duplex scan.  He knows to call sooner if he has problems.  In the meantime he knows to continue taking his aspirin.   [name], M.D.  Electronically Signed   CSD/MEDQ  D:  06/11/2008  T:  06/12/2008  Job:  [id number]   cc:   [name], MD

## 2010-07-28 NOTE — Discharge Summary (Signed)
NAMEMICHAIL, BOYTE NO.:  [id number]   MEDICAL RECORD NO.:  [id number]          PATIENT TYPE:  INP   LOCATION:  3306                         FACILITY:  [facility]   PHYSICIAN:  [name], M.D.DATE OF BIRTH:  07/11/1940   DATE OF ADMISSION:  04/17/2008  DATE OF DISCHARGE:  04/18/2008                               DISCHARGE SUMMARY   REASON FOR ADMISSION:  Symptomatic right carotid stenosis.   HISTORY:  This is a pleasant 70 year old gentleman who was referred by  Dr. Druscilla Brownie.  The patient had an episode of amaurosis fugax in the  right eye.  This prompted a duplex scan, which showed evidence of  bilateral severe carotid disease.  He was brought in for elective right  carotid endarterectomy as this was the symptomatic side.  We felt that  he would ultimately require a stage left carotid endarterectomy in  approximately 8 weeks given the severity of the stenosis on the left  also.   His past medical history is significant for tobacco use and a history of  hypercholesterolemia.  The remainder of his history and physical is as  dictated without addition or deletion.   HOSPITAL COURSE:  The patient was admitted on April 17, 2008, and  underwent a right carotid endarterectomy with Dacron patch angioplasty.  He was found to have a severe 90% stenosis with calcific plaque at the  carotid bifurcation of the right.  He awoke neurologically intact and  was monitored in the recovery room.  He did require dopamine initially  to maintain his blood pressure above 100 systolic.  This was weaned off  by postoperative day #1.  He was monitored in the step-down unit and  remained neurologically intact.  On postoperative day #1, he was  ambulating, eating, and voiding without difficulty and was discharged  home with instructions to follow up in 2 weeks with planned staged left  carotid endarterectomy in approximately 8 weeks.   CONDITION ON DISCHARGE:  Good.   DISPOSITION:  To home.   DISCHARGE DIAGNOSIS:  Symptomatic right carotid stenosis (amaurosis  fugax).   SECONDARY DIAGNOSES:  Tobacco abuse and hypercholesterolemia.      [name], M.D.  Electronically Signed     CSD/MEDQ  D:  04/18/2008  T:  04/18/2008  Job:  [id number]   cc:   [name], MD  Dr. Druscilla Brownie

## 2010-07-31 NOTE — Discharge Summary (Signed)
Erik [name], MARSZALEK NO.:  [id number]   MEDICAL RECORD NO.:  [id number]          PATIENT TYPE:  INP   LOCATION:  3306                         FACILITY:  [facility]   PHYSICIAN:  [name], M.D.DATE OF BIRTH:  1940-07-21   DATE OF ADMISSION:  05/30/2008  DATE OF DISCHARGE:  05/31/2008                               DISCHARGE SUMMARY   ADMISSION DIAGNOSIS:  Asymptomatic severe left internal carotid artery  stenosis.   FINAL DISCHARGE DIAGNOSES:  1. Asymptomatic severe left internal carotid artery stenosis, status      post left carotid endarterectomy.  2. Postoperative hypotension, improved.  3. Hypercholesterolemia.  4. Ongoing tobacco abuse.  5. Chronic obstructive pulmonary disease.  6. Allergy to CODEINE.  7. History of right carotid endarterectomy on April 17, 2008, for      symptomatic disease, specifically amaurosis fugax of the right eye.   PROCEDURES:  Left carotid endarterectomy with Dacron patch angioplasty  by Dr. Waverly Ferrari on May 30, 2008.   BRIEF HISTORY:  Mr. Gell is a 70 year old male who Dr. Edilia Bo saw in  January after an episode of amaurosis fugax in the right eye.  Duplex  scan showed severe bilateral carotid stenosis.  He underwent right  carotid endarterectomy on April 17, 2008, for symptomatic right  carotid stenosis.  He did well from that standpoint and was brought back  for staged left carotid endarterectomy for tight left carotid stenosis.   HOSPITAL COURSE:  Mr. Gebhard was electively admitted to [facility] on May 30, 2008.  He underwent the previously mentioned  procedure.  Postoperatively, he was extubated, neurologically intact  after a short stay in Recovery Unit and was transferred to step-down  unit 3200.  He did require dopamine for postoperative hypertension.  This was ultimately weaned, however, on postoperative day #1.  Other  than that, he had a relatively uneventful postoperative  course.  His  labs showed a white count 9.3, hemoglobin 10.3, hematocrit 30.2, and  platelet count 319.  Sodium 134, potassium 3.6, glucose of 116, BUN of  7, and creatinine of 0.75.  While in the hospital, he did undergo  tobacco cessation consult with the patient planning to use nicotine gum  in order to help quit.  Ultimately, Mr. Mauss was felt appropriate for  discharge to home on May 31, 2008, in early afternoon and he was off  dopamine.  Vitals were stable.  He is tolerating a regular diet and was  comfortable and voiding without difficulty.  His incision was intact as  well without evidence of hematoma.  He was felt to be in stable,  improving condition at the time of discharge.   DISCHARGE INSTRUCTIONS:  He is to continue a heart-healthy diet.  May  shower and clean incisions gently with soap and water.  Avoid driving or  heavy lifting for the next couple weeks.  He will see Dr. Edilia Bo in 2-3  weeks, but should call sooner if he has fever greater than 101, redness  or drainage from his incision site, severe headache, or neurologic  changes.   DISCHARGE MEDICATIONS:  1. Tylox 1-2 tablets p.o. every 4 hours p.r.n. pain.  2. Aspirin 325 mg p.o. q.p.m.  3. Vytorin 10/40 mg p.o. q.p.m.  4. Terbinafine 250 mg 1 tablet p.o. q.p.m. for 12 weeks.      Jerold Coombe, P.A.      [name], M.D.  Electronically Signed    AWZ/MEDQ  D:  07/09/2008  T:  07/10/2008  Job:  [id number]   cc:   [name], MD

## 2010-07-31 NOTE — H&P (Signed)
[facility]  Patient:    Erik [name], Erik [name] Visit Number: [id number] MRN: [id number]          Service Type: MED Location: [id number] Attending Physician:  Ilene Qua Dictated by:   Mike Gip, P.A.C. Admit Date:  02/22/2001   CC:         Laurita Quint, M.D. [facility]   History and Physical  CHIEF COMPLAINT:  Right lower quadrant pain, post colonoscopy.  HISTORY OF PRESENT ILLNESS:  Mr. Reier is a pleasant, generally healthy, 70 year old white male, primary patient of Dr. Hetty Ely, known to Dr. Arlyce Dice, who has history of colon polyps and prior colonoscopies.  The patient has a history of hyperlipidemia.  He is also status post cholecystectomy in the mid 1990s and status post left inguinal hernia repair.  The patient had undergone colonoscopy yesterday, February 21, 2001, per Dr. Arlyce Dice at the [facility] for follow-up of colon polyps.  He was found to have five colon polyps, three in the left colon and two in the right colon.  He had a polyp in the cecum, 10 mm, which was snared, injected with saline and cauterized and an ascending colon polyp which was hot biopsied.  One of the polyps in the descending colon was also snared.  The patient felt well at the time of discharge from the [facility], but says later last evening, he developed lower abdominal pain.  He did not call our office back, but took Tylenol for his discomfort.  He did eat dinner last evening, and then this morning, after he woke up, was able to eat breakfast, but had complaints of persistent pain.  He has had no complaint of nausea, vomiting, fever, or chills, and has had no bowel movement since his procedure, nor any bleeding.  The office called him in follow-up this morning and he expressed complaint of ongoing right lower quadrant pain with radiation to his right back and right leg, which was somewhat progressive since last night.  He was advised to come to the emergency room for  evaluation.  CURRENT MEDICATIONS: 1. Lipitor 10 mg p.o. q.d. 2. Lamisil tablets one p.o. q.d.  He is uncertain of the dosage. 3. Tylenol p.r.n.  ALLERGIES:  CODEINE, which in the past produced pharyngeal edema.  PAST MEDICAL HISTORY: 1. Pertinent for hyperlipidemia. 2. Prior cholecystectomy in 1996 per Dr. Precious Reel. 3. Left inguinal hernia repair per Dr. Maryagnes Amos.  FAMILY HISTORY:  Negative for GI disease.  There is no family history of coronary disease or diabetes that he is aware of.  SOCIAL HISTORY:  The patient is married.  He is employed.  He is a two pack per day smoker and drinks two alcoholic beverages per day.  REVIEW OF SYSTEMS:  CARDIOVASCULAR:  Denies any chest pain or anginal symptoms.  PULMONARY:  Negative for cough, shortness of breath, or sputum production.  GENITOURINARY:  Denies any dysuria, urgency, or frequency. GASTROINTESTINAL:  As outlined above.  PHYSICAL EXAMINATION:  The patient is a well-developed white male, alert and oriented.  He is uncomfortable but not in any acute distress.  VITAL SIGNS:  Temperature 98.9, blood pressure 135/50, pulse is 86, respirations 18.  O2 saturation was 97 on room air.  HEENT:  Nontraumatic, normocephalic.  EOMI, PERRLA.  Sclerae anicteric.  NECK:  Supple without nodes.  CARDIOVASCULAR:  Regular rate and rhythm with S1 and S2.  PULMONARY:  With scattered rhonchi.  ABDOMEN:  Somewhat tense with tenderness, guarding, and rebound in the right  lower quadrant.  He has no guarding in the left lower quadrant, but does have rebound tenderness in the left lower quadrant as well.  Bowel sounds are present.  RECTAL:  Not done.  The patient had colonoscopy yesterday.  EXTREMITIES:  Without clubbing, cyanosis, or edema.  LABORATORY:  Pending at this time.  IMPRESSION: 48. A 70 year old white male with acute right lower quadrant abdominal pain,    post colonoscopy and polypectomy on February 21, 2001.  Rule out post     polypectomy syndrome versus microperforation or free perforation. 2. History of colon polyps. 3. Status post laparoscopic cholecystectomy. 4. Status post left inguinal hernia repair. 5. Hyperlipidemia. 6. Smoker. 7. Daily ETOH.  PLAN:  The patient is admitted to the service of Dr. Yancey Flemings, who is covering the hospital.  He will be kept n.p.o., placed on IV fluids, pain control with Demerol and Phenergan as needed.  Will obtain STAT CT scan of the abdomen and pelvis, and will decide on surgical consultation and IV antibiotics, depending on CT result.  This was discussed with the patient and the patients wife in detail. Dictated by:   Mike Gip, P.A.C. Attending Physician:  Ilene Qua DD:  02/22/01 TD:  02/22/01 Job: [id number]

## 2010-07-31 NOTE — Discharge Summary (Signed)
[facility]  Patient:    Erik [name], Erik [name] Visit Number: [id number] MRN: [id number]          Service Type: MED Location: [phone] Attending Physician:  Estella Husk Dictated by:   Sammuel Cooper, P.A. Admit Date:  02/22/2001 Discharge Date: 02/28/2001   CC:         Barbette Hair. Arlyce Dice, M.D. [facility]             Laurita Quint, M.D. [facility]             Zigmund Daniel, M.D.                           Discharge Summary  ADMITTING DIAGNOSES: 1. A 70 year old white male with acute right lower quadrant abdominal pain,    status post colonoscopy and polypectomy on February 21, 2001.  Rule out    post-polypectomy syndrome versus microperforation or free perforation. 2. History of colon polyps. 3. Status post laparoscopic cholecystectomy. 4. Status post left inguinal hernia repair. 5. Hyperlipidemia. 6. Smoker.  DISCHARGE DIAGNOSES: 1. Discharge diagnoses stable, status post cecal perforation secondary to    polypectomy, contained. 2. Oral candidiasis secondary to antibiotics. 3. Diarrhea, acute Clostridium difficile positive secondary to intravenous     antibiotics. 4. Other diagnoses as listed above.  CONSULTATIONS:  Surgery, Dr. Jamey Ripa and Dr. Orson Slick.  PROCEDURES: 1. CT scan of the abdomen and pelvis. 2. Plain abdominal films.  BRIEF HISTORY:  Mr. Hoefle is a pleasant, generally healthy, 70 year old white male, a primary patient of Dr. Hetty Ely, also known to Dr. Arlyce Dice, with history of colon polyps and prior colonoscopys.  He had undergone a colonoscopy on February 21, 2001 at the [facility] for followup of colon polyps and was found to have five polyps, three in the left colon and two in the right colon.  One polyp was in the cecum, 10 mm, which was snared, injected with saline, and cauterized and also had an ascending colon polyp which was hot biopsied.  One of the polyps in the descending colon was also snared.  The patient had felt well  at the time of discharge from [facility] but says that later that evening he developed lower abdominal discomfort.  He did not call our office back but took Tylenol.  When he arose this morning, he ate breakfast, despite complaints of persistent pain.  He had no complaint of nausea, vomiting, fever, or chills, etc.  He had not had a bowel movement and had not noted any bleeding.  Our office called him on the morning of this admission for followup and he expressed complaints of ongoing right lower quadrant pain with radiation to his right back and right leg, which was somewhat progressive since last evening.  He was advised to come to the emergency room for evaluation.  He was seen and evaluated in the emergency room and noted to have significant abdominal tenderness in the right lower quadrant and was admitted to the hospital with post-polypectomy syndrome versus microperforation for further diagnostic evaluation, pain control, and surgical consultation.  LABORATORY DATA:  Laboratory studies on admission, December 11:  WBC was 15.55, hemoglobin 13.3, hematocrit 39.9, MCV 90, platelets 298.  Serial values were obtained.  By December 14, WBC was 7.8, hemoglobin 11.1, hematocrit 32.9; and followup on December 16, WBC of 6.2, hemoglobin 13.7, hematocrit 40.6, platelets 391.  On admission, electrolytes:  Sodium 133, potassium 4, glucose 103, BUN  11, creatinine 0.9, albumin 3.9.  Liver function studies within normal limits.  Stool was C. difficile positive on February 27, 2001.  X-ray studies:   CT scan of the abdomen and pelvis on December 11 showed no free intraperitoneal air.  There was thickening of the tissues dorsal to the cecum and a pocket of gas within this second area, suspicious for a contained perforation associated with some hemorrhage or inflammation.  Possible small developing abscess.  Plain films on December 14 showed no evidence of free intraperitoneal air.  Locular curvilinear  lucency at the right lung base. Question persistent loculated extraluminal gas.  HOSPITAL COURSE:  The patient was admitted to the service of Dr. Yancey Flemings, who was covering the hospital.  He was initially kept n.p.o., started on IV fluids at 125 cc an hour.  He had stat CT scan of the abdomen and pelvis done in the emergency room.  Baseline labs were obtained.  He was given Demerol and Phenergan as needed for control of pain and nausea and a surgical consultation was obtained with Dr. Orson Slick.  CT scan findings as outlined above, consistent with a contained perforation.  Dr. Orson Slick advised IV antibiotics and conservative management with close followup.  By the following morning, he was continuing to complain of same level of pain.  Temperature was 99.3.  White count was 12.2.  We kept him n.p.o., continued the antibiotics with plans for serial exams.  He had a stable day that day with no evidence for deterioration.  By December 13, he was feeling somewhat better, though febrile to 101.9.  White count was 11.7.  We continued close observation and IV antibiotics.  By December 14, he was looking and feeling better.  White count down to 7.8.  Followup abdominal films as outlined above.  By December 15, he was feeling much better.  He was afebrile.  He stated that his pain was almost gone.  He was allowed a clear-liquid diet and we began plans for weaning him off of his pain medication.  By December 16, again with continued improvement, asking for solid food, though stating that he had had six episodes of diarrhea over the past 24 hours.  He was afebrile with a white count of 6.2.  He also mentioned that his wife was at home ill with a viral illness with vomiting and diarrhea, as was his whole family.  We did obtain stool for C. difficile, which returned positive on December 17.  In the interim, he was advanced to a low-residue diet.  We placed him empirically on Flagyl and converted his  Cipro to p.o.  He was kept overnight and on the morning of December 17 said that his  diarrhea was improved, though still present.  He had tolerated solid food well, temperature was 97.9, and he was allowed discharge to home in stable and improved condition with instructions to follow up with Dr. Arlyce Dice on Friday, December 20, at 1:30 p.m.  He was advised to call in the interim for any problems with increasing abdominal pain, diarrhea, fever, etc.  He was to maintain a low-residue diet and limit his activity.  DISCHARGE MEDICATIONS: 1. Flagyl 250 mg p.o. q.i.d., as he had completed a seven-day course of IV    antibiotics and was C. difficile positive. 2. Mycostatin 5 cc q.i.d. for oral candidiasis. 3. Tylenol p.r.n. 4. Lipitor 10 mg p.o. q.d. 5. Lamisil q.d. as previous. Dictated by:   Sammuel Cooper, P.A. Attending Physician:  Estella Husk DD:  03/21/01 TD:  03/22/01 Job: [id number]

## 2010-09-24 ENCOUNTER — Other Ambulatory Visit: Payer: Self-pay | Admitting: Family Medicine

## 2010-09-25 NOTE — Telephone Encounter (Signed)
Patient advised and appt made

## 2010-09-25 NOTE — Telephone Encounter (Signed)
Patient not seen in over 1 year for cpx

## 2010-09-25 NOTE — Telephone Encounter (Signed)
Schedule CPX, will refill x 3 months, no further refills till CPX.

## 2010-11-19 ENCOUNTER — Other Ambulatory Visit (INDEPENDENT_AMBULATORY_CARE_PROVIDER_SITE_OTHER): Payer: Medicare Other | Admitting: *Deleted

## 2010-11-19 ENCOUNTER — Encounter: Payer: Self-pay | Admitting: Vascular Surgery

## 2010-11-19 ENCOUNTER — Ambulatory Visit: Payer: Self-pay

## 2010-11-19 DIAGNOSIS — Z48812 Encounter for surgical aftercare following surgery on the circulatory system: Secondary | ICD-10-CM

## 2010-11-19 DIAGNOSIS — I6529 Occlusion and stenosis of unspecified carotid artery: Secondary | ICD-10-CM

## 2010-11-25 ENCOUNTER — Encounter: Payer: Self-pay | Admitting: Vascular Surgery

## 2010-11-25 NOTE — Procedures (Unsigned)
CAROTID DUPLEX EXAM  INDICATION:  Carotid endarterectomy.  HISTORY: Diabetes:  No. Cardiac:  No. Hypertension:  No. Smoking:  Yes. Previous Surgery:  Right carotid endarterectomy on 04/17/2008, left carotid endarterectomy on 05/30/2008. CV History:  Currently asymptomatic. Amaurosis Fugax No, Paresthesias No, Hemiparesis No                                      RIGHT             LEFT Brachial systolic pressure:         158               156 Brachial Doppler waveforms:         Normal            Normal Vertebral direction of flow:        Antegrade         Antegrade DUPLEX VELOCITIES (cm/sec) CCA peak systolic                   84                83 ECA peak systolic                   130               85 ICA peak systolic                   66                70 ICA end diastolic                   20                23 PLAQUE MORPHOLOGY:                  Heterogeneous     Heterogeneous PLAQUE AMOUNT:                      Mild              Mild PLAQUE LOCATION:                    CCA               CCA  IMPRESSION: 1. Patent bilateral carotid endarterectomy sites with no bilateral     internal carotid artery stenoses. 2. No significant change noted when compared to the previous exam on     12/03/2009.  ___________________________________________ [name], M.D.  CH/MEDQ  D:  11/20/2010  T:  11/20/2010  Job:  [id number]

## 2010-12-03 ENCOUNTER — Telehealth: Payer: Self-pay | Admitting: Family Medicine

## 2010-12-03 ENCOUNTER — Other Ambulatory Visit (INDEPENDENT_AMBULATORY_CARE_PROVIDER_SITE_OTHER): Payer: Medicare Other

## 2010-12-03 ENCOUNTER — Other Ambulatory Visit: Payer: Self-pay | Admitting: Vascular Surgery

## 2010-12-03 DIAGNOSIS — R7309 Other abnormal glucose: Secondary | ICD-10-CM

## 2010-12-03 DIAGNOSIS — Z48812 Encounter for surgical aftercare following surgery on the circulatory system: Secondary | ICD-10-CM

## 2010-12-03 DIAGNOSIS — E785 Hyperlipidemia, unspecified: Secondary | ICD-10-CM

## 2010-12-03 DIAGNOSIS — I6529 Occlusion and stenosis of unspecified carotid artery: Secondary | ICD-10-CM

## 2010-12-03 LAB — COMPREHENSIVE METABOLIC PANEL
CO2: 27 mEq/L (ref 19–32)
Calcium: 8.8 mg/dL (ref 8.4–10.5)
Chloride: 104 mEq/L (ref 96–112)
Creatinine, Ser: 0.8 mg/dL (ref 0.4–1.5)
GFR: 100.29 mL/min (ref >60.00)
Glucose, Bld: 102 mg/dL — ABNORMAL HIGH (ref 70–99)
Total Bilirubin: 0.3 mg/dL (ref 0.3–1.2)
Total Protein: 6.7 g/dL (ref 6.0–8.3)

## 2010-12-03 LAB — LIPID PANEL
Cholesterol: 123 mg/dL (ref 0–200)
HDL: 40.3 mg/dL (ref >39.00)
VLDL: 23.2 mg/dL (ref 0.0–40.0)

## 2010-12-03 NOTE — Telephone Encounter (Signed)
Message copied by Excell Seltzer on Thu Dec 03, 2010 12:47 PM ------      Message from: Baldomero Lamy      Created: Thu Dec 03, 2010 10:24 AM      Regarding: Labs today, orders please       I drew this pt today, can you order this pt's labs, sorry I didn't get a chance to send out for orders earlier.       Thanks      [employer]

## 2010-12-10 ENCOUNTER — Telehealth: Payer: Self-pay | Admitting: *Deleted

## 2010-12-10 ENCOUNTER — Encounter: Payer: Self-pay | Admitting: Family Medicine

## 2010-12-10 ENCOUNTER — Ambulatory Visit (INDEPENDENT_AMBULATORY_CARE_PROVIDER_SITE_OTHER): Payer: Medicare Other | Admitting: Family Medicine

## 2010-12-10 DIAGNOSIS — I6529 Occlusion and stenosis of unspecified carotid artery: Secondary | ICD-10-CM

## 2010-12-10 DIAGNOSIS — F17200 Nicotine dependence, unspecified, uncomplicated: Secondary | ICD-10-CM

## 2010-12-10 DIAGNOSIS — J449 Chronic obstructive pulmonary disease, unspecified: Secondary | ICD-10-CM

## 2010-12-10 DIAGNOSIS — E785 Hyperlipidemia, unspecified: Secondary | ICD-10-CM

## 2010-12-10 DIAGNOSIS — Z0000 Encounter for general adult medical examination without abnormal findings: Secondary | ICD-10-CM

## 2010-12-10 DIAGNOSIS — R7309 Other abnormal glucose: Secondary | ICD-10-CM

## 2010-12-10 DIAGNOSIS — J4489 Other specified chronic obstructive pulmonary disease: Secondary | ICD-10-CM

## 2010-12-10 DIAGNOSIS — Z125 Encounter for screening for malignant neoplasm of prostate: Secondary | ICD-10-CM

## 2010-12-10 MED ORDER — EZETIMIBE-SIMVASTATIN 10-40 MG PO TABS: 1.0000 | ORAL_TABLET | Freq: Every day | ORAL | Status: DC

## 2010-12-10 MED ORDER — TAMSULOSIN HCL 0.4 MG PO CAPS: 0.4000 mg | ORAL_CAPSULE | Freq: Every day | ORAL | Status: DC

## 2010-12-10 NOTE — Assessment & Plan Note (Signed)
stable °

## 2010-12-10 NOTE — Assessment & Plan Note (Signed)
Precontemplative, understands risks.

## 2010-12-10 NOTE — Progress Notes (Signed)
Subjective:    Patient ID: Erik [name], male    DOB: 1940-09-16, 70 y.o.   MRN: [id number]  HPI I have personally reviewed the Medicare Annual Wellness questionnaire and have noted 1. The patient's medical and social history 2. Their use of alcohol, tobacco or illicit drugs 3. Their current medications and supplements 4. The patient's functional ability including ADL's, fall risks, home safety risks and hearing or visual             impairment. 5. Diet and physical activities 6. Evidence for depression or mood disorders The patients weight, height, BMI and visual acuity have been recorded in the chart I have made referrals, counseling and provided education to the patient based review of the above and I have provided the pt with a written personalized care plan for preventive services.   Elevated Cholesterol:On Vytorin..  At Goal LDL<70 Using medications without problems:None Muscle aches: None Diet compliance: Fair Exercise:None Other complaints:B carotid stenosis, claudication....followed by Vascular MD, last carotid was last week, stable BP Well controlled.  Prediabetes:Stable control, almost resolved with lifestyle changes.  COPD: still smoking 1-2 packs per day... Not interested in quitting. Has smoked 42 years... Not interested in CXR screen.Marland Kitchen He is assymptomatic.Marland Kitchen Last nml CXR in 2009-2010 prior to surgery.  Review of Systems  Constitutional: Negative for fever, fatigue and unexpected weight change.  HENT: Negative for ear pain, congestion, sore throat, rhinorrhea, trouble swallowing and postnasal drip.   Eyes: Negative for pain.  Respiratory: Negative for cough, shortness of breath and wheezing.   Cardiovascular: Negative for chest pain, palpitations and leg swelling.  Gastrointestinal: Negative for nausea, abdominal pain, diarrhea, constipation and blood in stool.  Genitourinary: Negative for dysuria, urgency, hematuria, discharge, penile swelling, scrotal swelling,  difficulty urinating, penile pain and testicular pain.  Skin: Negative for rash.  Neurological: Negative for syncope, weakness, light-headedness, numbness and headaches.  Psychiatric/Behavioral: Negative for behavioral problems and dysphoric mood. The patient is not nervous/anxious.        Objective:   Physical Exam  Constitutional: He appears well-developed and well-nourished.  Non-toxic appearance. He does not appear ill. No distress.  HENT:  Head: Normocephalic and atraumatic.  Right Ear: Hearing, tympanic membrane, external ear and ear canal normal.  Left Ear: Hearing, tympanic membrane, external ear and ear canal normal.  Nose: Nose normal.  Mouth/Throat: Uvula is midline, oropharynx is clear and moist and mucous membranes are normal.       Cerumen impaction B.. B TMs irrigated.  Eyes: Conjunctivae, EOM and lids are normal. Pupils are equal, round, and reactive to light. No foreign bodies found.  Neck: Trachea normal, normal range of motion and phonation normal. Neck supple. Carotid bruit is not present. No mass and no thyromegaly present.  Cardiovascular: Normal rate, regular rhythm, S1 normal, S2 normal, intact distal pulses and normal pulses.  Exam reveals no gallop.   No murmur heard. Pulmonary/Chest: Breath sounds normal. He has no wheezes. He has no rhonchi. He has no rales.  Abdominal: Soft. Normal appearance and bowel sounds are normal. There is no hepatosplenomegaly. There is no tenderness. There is no rebound, no guarding and no CVA tenderness. No hernia. Hernia confirmed negative in the right inguinal area and confirmed negative in the left inguinal area.  Genitourinary: Testes normal and penis normal. Rectal exam shows no external hemorrhoid, no internal hemorrhoid, no fissure, no mass, no tenderness and anal tone normal. Guaiac negative stool. Prostate is enlarged. Prostate is not tender. Right testis shows  no mass and no tenderness. Left testis shows no mass and no  tenderness. No paraphimosis or penile tenderness.  Lymphadenopathy:    He has no cervical adenopathy.       Right: No inguinal adenopathy present.       Left: No inguinal adenopathy present.  Neurological: He is alert. He has normal strength and normal reflexes. No cranial nerve deficit or sensory deficit. Gait normal.  Skin: Skin is warm, dry and intact. No rash noted.  Psychiatric: He has a normal mood and affect. His speech is normal and behavior is normal. Judgment normal.          Assessment & Plan:  AMW: The patient's preventative maintenance and recommended screening tests for an annual wellness exam were reviewed in full today. Brought up to date unless services declined.  Counselled on the importance of diet, exercise, and its role in overall health and mortality. The patient's FH and SH was reviewed, including their home life, tobacco status, and drug and alcohol status.   Td, PNA: Up to date Shingles:not interested Flu: not interested PSA: Hx of BPH on tamulosin. Last 2010 0.85. Recheck today. Colon: 06/2008 Dr. Jarold Motto, repeat 10 years hyperplastic polyps CXR/spirometry. Refused CXR, spirometry performed.

## 2010-12-10 NOTE — Patient Instructions (Signed)
Work on lowering sugar intake, decrease carbohydrates, work on [employer].

## 2010-12-10 NOTE — Assessment & Plan Note (Signed)
At goal <70 on vytorin.

## 2010-12-10 NOTE — Assessment & Plan Note (Signed)
Mild obstruction on spirometry since he does not have any symtpoms or frequent infections, he is not interested in spiriva.

## 2010-12-12 LAB — PSA, MEDICARE: PSA: 0.73 ng/mL (ref <=4.00)

## 2010-12-16 NOTE — Telephone Encounter (Signed)
Opened in error

## 2011-10-15 ENCOUNTER — Telehealth: Payer: Self-pay | Admitting: Family Medicine

## 2011-10-15 NOTE — Telephone Encounter (Signed)
Patient's wife called.  Patient was sawing yesterday around lunchtime and he lost half of two of his fingernails on his right hand.  It's still bleeding.  I spoke to [facility] and she suggested patient go to urgent care.  I advised patient's wife and she said she would tell him to go to [facility] in [place].

## 2011-11-11 ENCOUNTER — Encounter: Payer: Medicare Other | Admitting: Family Medicine

## 2011-11-23 ENCOUNTER — Encounter: Payer: Self-pay | Admitting: Vascular Surgery

## 2011-11-24 ENCOUNTER — Encounter: Payer: Self-pay | Admitting: Vascular Surgery

## 2011-11-24 ENCOUNTER — Other Ambulatory Visit (INDEPENDENT_AMBULATORY_CARE_PROVIDER_SITE_OTHER): Payer: Medicare Other | Admitting: *Deleted

## 2011-11-24 ENCOUNTER — Ambulatory Visit (INDEPENDENT_AMBULATORY_CARE_PROVIDER_SITE_OTHER): Payer: Medicare Other | Admitting: Vascular Surgery

## 2011-11-24 VITALS — BP 189/79 | HR 68 | Resp 12 | Ht 67.0 in | Wt 143.0 lb

## 2011-11-24 DIAGNOSIS — Z48812 Encounter for surgical aftercare following surgery on the circulatory system: Secondary | ICD-10-CM

## 2011-11-24 DIAGNOSIS — I6529 Occlusion and stenosis of unspecified carotid artery: Secondary | ICD-10-CM

## 2011-11-24 NOTE — Progress Notes (Signed)
[facility]  Patient name: Erik [name] MRN: [id number] DOB: 06/24/1940 Sex: male  REASON FOR VISIT: follow up of carotid disease  HPI: Erik [name] is a 71 y.o. male who underwent a right carotid endarterectomy in February of 2010. He subsequent to have a left carotid endarterectomy in March of 2010. He comes in for a yearly follow up study. Since I saw him last he has had no history of stroke, TIAs, expressive or receptive aphasia, or amaurosis fugax. Unfortunately he continues to smoke 1-1/2 packs per day. There is been no other significant change in his medical history. He does have a history of hypertension, hyperlipidemia, and COPD. These have been stable.   REVIEW OF SYSTEMS: Arly.Keller ] denotes positive finding; [  ] denotes negative finding  CARDIOVASCULAR:  [ ]  chest pain   [ ]  dyspnea on exertion    CONSTITUTIONAL:  [ ]  fever   [ ]  chills  PHYSICAL EXAM: Filed Vitals:   11/24/11 1525 11/24/11 1530  BP: 172/72 189/79  Pulse: 78 68  Resp: 16 12  Height: 5\' 7"  (1.702 m)   Weight: 143 lb (64.864 kg)   SpO2: 99% 100%   Body mass index is 22.40 kg/(m^2). GENERAL: The patient is a well-nourished male, in no acute distress. The vital signs are documented above. CARDIOVASCULAR: There is a regular rate and rhythm. I do not detect carotid bruits. PULMONARY: There is good air exchange bilaterally without wheezing or rales. NEURO: He has no focal weakness or paresthesias.  I have independently interpreted his carotid duplex scan which shows no evidence of recurrent carotid stenosis on either side. Both vertebral arteries are patent with normally directed flow.  MEDICAL ISSUES:  CAROTID ARTERY STENOSIS, BILATERAL This patient has undergone bilateral carotid endarterectomies. He is at yearly carotid duplex scan today shows no evidence of recurrent carotid stenosis on either side. I've ordered a follow up carotid duplex scan in 1 year. If this has not  changed and I'll plan on seeing him back in 2 years. It is any change and will see him sooner. We have also again discussed the importance of tobacco cessation. He does not appear to be especially motivated. He knows to call sooner if he has problems.   Countess Biebel S [facility] Beeper: [phone]

## 2011-11-24 NOTE — Assessment & Plan Note (Signed)
This patient has undergone bilateral carotid endarterectomies. He is at yearly carotid duplex scan today shows no evidence of recurrent carotid stenosis on either side. I've ordered a follow up carotid duplex scan in 1 year. If this has not changed and I'll plan on seeing him back in 2 years. It is any change and will see him sooner. We have also again discussed the importance of tobacco cessation. He does not appear to be especially motivated. He knows to call sooner if he has problems.

## 2011-11-25 NOTE — Addendum Note (Signed)
Addended by: Sharee Pimple on: 11/25/2011 08:10 AM   Modules accepted: Orders

## 2011-12-02 ENCOUNTER — Telehealth: Payer: Self-pay | Admitting: Family Medicine

## 2011-12-02 DIAGNOSIS — E785 Hyperlipidemia, unspecified: Secondary | ICD-10-CM

## 2011-12-02 DIAGNOSIS — Z125 Encounter for screening for malignant neoplasm of prostate: Secondary | ICD-10-CM

## 2011-12-02 NOTE — Telephone Encounter (Signed)
Message copied by Excell Seltzer on Thu Dec 02, 2011 11:22 AM ------      Message from: [place], [place] J      Created: Thu Nov 25, 2011  9:04 AM      Regarding: Lab orders for Friday, 12-03-11       .Patient is scheduled for CPX labs, please order future labs, Thanks , Camelia Eng

## 2011-12-03 ENCOUNTER — Other Ambulatory Visit (INDEPENDENT_AMBULATORY_CARE_PROVIDER_SITE_OTHER): Payer: Medicare Other

## 2011-12-03 DIAGNOSIS — E785 Hyperlipidemia, unspecified: Secondary | ICD-10-CM

## 2011-12-03 DIAGNOSIS — Z125 Encounter for screening for malignant neoplasm of prostate: Secondary | ICD-10-CM

## 2011-12-03 LAB — COMPREHENSIVE METABOLIC PANEL
ALT: 20 U/L (ref 0–53)
AST: 20 U/L (ref 0–37)
Albumin: 4 g/dL (ref 3.5–5.2)
Alkaline Phosphatase: 64 U/L (ref 39–117)
Glucose, Bld: 105 mg/dL — ABNORMAL HIGH (ref 70–99)
Potassium: 4.2 mEq/L (ref 3.5–5.1)
Sodium: 138 mEq/L (ref 135–145)
Total Protein: 7.2 g/dL (ref 6.0–8.3)

## 2011-12-03 LAB — LIPID PANEL
LDL Cholesterol: 74 mg/dL (ref 0–99)
Total CHOL/HDL Ratio: 3

## 2011-12-03 LAB — PSA, MEDICARE: PSA: 1.02 ng/ml (ref 0.10–4.00)

## 2011-12-10 ENCOUNTER — Ambulatory Visit (INDEPENDENT_AMBULATORY_CARE_PROVIDER_SITE_OTHER): Payer: Medicare Other | Admitting: Family Medicine

## 2011-12-10 ENCOUNTER — Encounter: Payer: Self-pay | Admitting: Family Medicine

## 2011-12-10 VITALS — BP 142/78 | HR 93 | Temp 98.5°F | Ht 67.0 in | Wt 145.5 lb

## 2011-12-10 DIAGNOSIS — N529 Male erectile dysfunction, unspecified: Secondary | ICD-10-CM | POA: Diagnosis not present

## 2011-12-10 DIAGNOSIS — Z0000 Encounter for general adult medical examination without abnormal findings: Secondary | ICD-10-CM

## 2011-12-10 DIAGNOSIS — I6529 Occlusion and stenosis of unspecified carotid artery: Secondary | ICD-10-CM

## 2011-12-10 DIAGNOSIS — R7309 Other abnormal glucose: Secondary | ICD-10-CM | POA: Diagnosis not present

## 2011-12-10 DIAGNOSIS — J449 Chronic obstructive pulmonary disease, unspecified: Secondary | ICD-10-CM

## 2011-12-10 DIAGNOSIS — R6882 Decreased libido: Secondary | ICD-10-CM | POA: Insufficient documentation

## 2011-12-10 DIAGNOSIS — I739 Peripheral vascular disease, unspecified: Secondary | ICD-10-CM

## 2011-12-10 DIAGNOSIS — E785 Hyperlipidemia, unspecified: Secondary | ICD-10-CM | POA: Diagnosis not present

## 2011-12-10 DIAGNOSIS — J4489 Other specified chronic obstructive pulmonary disease: Secondary | ICD-10-CM

## 2011-12-10 NOTE — Assessment & Plan Note (Signed)
Almost at goal on vytorin. The patient is advised to quit smoking, begin progressive daily aerobic exercise program and follow a low fat, low cholesterol diet.

## 2011-12-10 NOTE — Assessment & Plan Note (Signed)
Stable control. 

## 2011-12-10 NOTE — Patient Instructions (Addendum)
Every now and then check BP at pharmacy, at goal <140/90. Work on low fat, low cholesterol ,low carb diet. Start 5-7 days a week exercise. More regularly.  Stop at lab, we will call with lab results.  Trial of cialis. Use lowest dose possible prior to sexual activity. Call us with where you want your refills to go.

## 2011-12-10 NOTE — Progress Notes (Signed)
HPI  I have personally reviewed the Medicare Annual Wellness questionnaire and have noted  1. The patient's medical and social history  2. Their use of alcohol, tobacco or illicit drugs  3. Their current medications and supplements  4. The patient's functional ability including ADL's, fall risks, home safety risks and hearing or visual  impairment.  5. Diet and physical activities  6. Evidence for depression or mood disorders  The patients weight, height, BMI and visual acuity have been recorded in the chart  I have made referrals, counseling and provided education to the patient based review of the above and I have provided the pt with a written personalized care plan for preventive services.   Elevated Cholesterol:On Vytorin.. At Goal LDL<70  Lab Results  Component Value Date   CHOL 138 12/03/2011   HDL 41.00 12/03/2011   LDLCALC 74 12/03/2011   LDLDIRECT 176.9 05/09/2008   TRIG 117.0 12/03/2011   CHOLHDL 3 12/03/2011  Using medications without problems:None  Muscle aches: None  Diet compliance: Fair  Exercise:working in yard. Walking dogs. Other complaints:  B carotid stenosis, claudication....followed by Vascular MD, last carotid was last week, stable   BP Well Borderline controlled.   BPH: stable control on flomax.  Prediabetes:Stable control, almost resolved with lifestyle changes.   COPD: still smoking 1-2 packs per day... Not interested in quitting.  No acute bronchitis issues, no cough. No wheeze. Has smoked 42 years... Not interested in CXR screen.Marland Kitchen He is asymptomatic.Marland Kitchen Last nml CXR in 2009-2010 prior to surgery.   Review of Systems  Constitutional: Negative for fever, fatigue and unexpected weight change.  HENT: Negative for ear pain, congestion, sore throat, rhinorrhea, trouble swallowing and postnasal drip.  Eyes: Negative for pain.  Respiratory: Negative for cough, shortness of breath and wheezing.  Cardiovascular: Negative for chest pain, palpitations and leg  swelling.  Gastrointestinal: Negative for nausea, abdominal pain, diarrhea, constipation and blood in stool.  Genitourinary: Negative for dysuria, urgency, hematuria, discharge, penile swelling, scrotal swelling, difficulty urinating, penile pain and testicular pain.  Skin: Negative for rash.  Neurological: Negative for syncope, weakness, light-headedness, numbness and headaches.  Psychiatric/Behavioral: Negative for behavioral problems and dysphoric mood. The patient is not nervous/anxious.   History   Social History  . Marital Status: Married    Spouse Name: N/A    Number of Children: N/A  . Years of Education: N/A   Occupational History  . Not on file.   Social History Main Topics  . Smoking status: Current Every Day Smoker -- 1.5 packs/day for .5 years    Types: Cigarettes  . Smokeless tobacco: Not on file  . Alcohol Use: No  . Drug Use: No  . Sexually Active:    Other Topics Concern  . Not on file   Social History Narrative   No living will, no HCPOA. (reviewed 2013)    Objective:   Physical Exam  Constitutional: He appears well-developed and well-nourished. Non-toxic appearance. He does not appear ill. No distress.  HENT:  Head: Normocephalic and atraumatic.  Right Ear: Hearing, tympanic membrane, external ear and ear canal normal.  Left Ear: Hearing, tympanic membrane, external ear and ear canal normal.  Nose: Nose normal.  Mouth/Throat: Uvula is midline, oropharynx is clear and moist and mucous membranes are normal.  Cerumen impaction B.. B TMs irrigated.  Eyes: Conjunctivae, EOM and lids are normal. Pupils are equal, round, and reactive to light. No foreign bodies found.  Neck: Trachea normal, normal range of motion and  phonation normal. Neck supple. Carotid bruit is not present. No mass and no thyromegaly present.  Cardiovascular: Normal rate, regular rhythm, S1 normal, S2 normal, intact distal pulses and normal pulses. Exam reveals no gallop.  No murmur heard.   Pulmonary/Chest: Breath sounds normal. He has no wheezes. He has no rhonchi. He has no rales.  Abdominal: Soft. Normal appearance and bowel sounds are normal. There is no hepatosplenomegaly. There is no tenderness. There is no rebound, no guarding and no CVA tenderness. No hernia. Hernia confirmed negative in the right inguinal area and confirmed negative in the left inguinal area.  Genitourinary: Testes normal and penis normal. Rectal exam shows no external hemorrhoid, no internal hemorrhoid, no fissure, no mass, no tenderness and anal tone normal. Guaiac negative stool. Prostate is enlarged. Prostate is not tender. Right testis shows no mass and no tenderness. Left testis shows no mass and no tenderness. No paraphimosis or penile tenderness.  Lymphadenopathy:  He has no cervical adenopathy.  Right: No inguinal adenopathy present.  Left: No inguinal adenopathy present.  Neurological: He is alert. He has normal strength and normal reflexes. No cranial nerve deficit or sensory deficit. Gait normal.  Skin: Skin is warm, dry and intact. No rash noted.  Psychiatric: He has a normal mood and affect. His speech is normal and behavior is normal. Judgment normal.    Assessment & Plan:   AMW: The patient's preventative maintenance and recommended screening tests for an annual wellness exam were reviewed in full today.  Brought up to date unless services declined.  Counselled on the importance of diet, exercise, and its role in overall health and mortality.  The patient's FH and SH was reviewed, including their home life, tobacco status, and drug and alcohol status.   Td, PNA: Up to date Shingles:not interested  Flu: not interested  PSA: Hx of BPH on tamulosin.   Disucssed stopping PSa testing this year.. Will stop. Lab Results  Component Value Date   PSA 1.02 12/03/2011   PSA 0.73 12/10/2010   PSA 0.83 05/09/2008   Colon: 06/2008 Dr. Jarold Motto, repeat 10 years hyperplastic polyps  Smoker:  CXR/spirometry. Refused CXR again this year, spirometry performed 2012.. Mild obstruction, not interested in spiriva.

## 2011-12-10 NOTE — Assessment & Plan Note (Signed)
Nml genital exam. Send for testosterone check. Trial of cialis. Samples given.

## 2011-12-11 LAB — TESTOSTERONE: Testosterone: 301.58 ng/dL (ref 300–890)

## 2011-12-13 ENCOUNTER — Telehealth: Payer: Self-pay | Admitting: Family Medicine

## 2011-12-13 ENCOUNTER — Encounter: Payer: Self-pay | Admitting: *Deleted

## 2011-12-14 NOTE — Telephone Encounter (Signed)
Chart opened in error

## 2011-12-18 ENCOUNTER — Other Ambulatory Visit: Payer: Self-pay | Admitting: Family Medicine

## 2012-04-30 ENCOUNTER — Other Ambulatory Visit: Payer: Self-pay

## 2012-09-10 ENCOUNTER — Other Ambulatory Visit: Payer: Self-pay | Admitting: Family Medicine

## 2012-11-29 ENCOUNTER — Ambulatory Visit: Payer: Medicare Other | Admitting: Family

## 2012-11-29 ENCOUNTER — Other Ambulatory Visit: Payer: Medicare Other

## 2012-12-05 ENCOUNTER — Encounter: Payer: Self-pay | Admitting: Family

## 2012-12-06 ENCOUNTER — Encounter: Payer: Self-pay | Admitting: Family

## 2012-12-06 ENCOUNTER — Ambulatory Visit (INDEPENDENT_AMBULATORY_CARE_PROVIDER_SITE_OTHER): Payer: Medicare Other | Admitting: Family

## 2012-12-06 ENCOUNTER — Other Ambulatory Visit (INDEPENDENT_AMBULATORY_CARE_PROVIDER_SITE_OTHER): Payer: Medicare Other | Admitting: *Deleted

## 2012-12-06 DIAGNOSIS — Z48812 Encounter for surgical aftercare following surgery on the circulatory system: Secondary | ICD-10-CM

## 2012-12-06 DIAGNOSIS — I6529 Occlusion and stenosis of unspecified carotid artery: Secondary | ICD-10-CM

## 2012-12-06 NOTE — Progress Notes (Signed)
[name] had an 11:40 am appt. With the NP S. Nickel. Pt. Was offered coffee/Tea which he decline. NP spoke to pt " we have a couple of issues with another pt. But I will be with you shortly".  Pt walked out 10 min. Later.   Erik [name], RMA, AMT

## 2012-12-07 ENCOUNTER — Other Ambulatory Visit: Payer: Self-pay | Admitting: *Deleted

## 2012-12-07 DIAGNOSIS — Z48812 Encounter for surgical aftercare following surgery on the circulatory system: Secondary | ICD-10-CM

## 2012-12-08 ENCOUNTER — Encounter: Payer: Self-pay | Admitting: Family Medicine

## 2012-12-08 NOTE — Telephone Encounter (Signed)
Okay to refill   but pt needs appt for CPX with labs prior. Refill until appt .

## 2012-12-10 MED ORDER — TAMSULOSIN HCL 0.4 MG PO CAPS: ORAL_CAPSULE | ORAL | Status: DC

## 2012-12-10 NOTE — Telephone Encounter (Signed)
CPX scheduled for 02/27/2013.

## 2012-12-11 ENCOUNTER — Other Ambulatory Visit: Payer: Self-pay

## 2012-12-11 ENCOUNTER — Encounter: Payer: Self-pay | Admitting: Vascular Surgery

## 2012-12-11 MED ORDER — TAMSULOSIN HCL 0.4 MG PO CAPS: ORAL_CAPSULE | ORAL | Status: DC

## 2012-12-11 NOTE — Telephone Encounter (Signed)
Erik Sawin request tamsulosin [facility] and cancelled at [facility](spoke with [facility] at [facility] and cancelled refill). Erik [name] done.

## 2013-01-18 ENCOUNTER — Other Ambulatory Visit: Payer: Self-pay

## 2013-02-20 ENCOUNTER — Other Ambulatory Visit (INDEPENDENT_AMBULATORY_CARE_PROVIDER_SITE_OTHER): Payer: Medicare Other

## 2013-02-20 ENCOUNTER — Telehealth: Payer: Self-pay | Admitting: Family Medicine

## 2013-02-20 DIAGNOSIS — R7309 Other abnormal glucose: Secondary | ICD-10-CM

## 2013-02-20 DIAGNOSIS — E785 Hyperlipidemia, unspecified: Secondary | ICD-10-CM

## 2013-02-20 LAB — LIPID PANEL
Cholesterol: 118 mg/dL (ref 0–200)
HDL: 32.7 mg/dL — ABNORMAL LOW (ref >39.00)
LDL Cholesterol: 69 mg/dL (ref 0–99)
Triglycerides: 83 mg/dL (ref 0.0–149.0)

## 2013-02-20 LAB — COMPREHENSIVE METABOLIC PANEL
ALT: 15 U/L (ref 0–53)
AST: 17 U/L (ref 0–37)
BUN: 16 mg/dL (ref 6–23)
CO2: 27 mEq/L (ref 19–32)
Calcium: 8.6 mg/dL (ref 8.4–10.5)
Chloride: 100 mEq/L (ref 96–112)
Creatinine, Ser: 0.8 mg/dL (ref 0.4–1.5)
GFR: 95.55 mL/min (ref >60.00)
Glucose, Bld: 100 mg/dL — ABNORMAL HIGH (ref 70–99)
Total Bilirubin: 0.4 mg/dL (ref 0.3–1.2)

## 2013-02-20 NOTE — Telephone Encounter (Signed)
Message copied by Excell Seltzer on Tue Feb 20, 2013  7:57 AM ------      Message from: Alvina Chou      Created: Fri Feb 09, 2013 11:35 AM      Regarding: Lab orders for Tuesday, 12.9.14       Patient is scheduled for CPX labs, please order future labs, Thanks , Terri       ------

## 2013-02-27 ENCOUNTER — Encounter: Payer: Self-pay | Admitting: Family Medicine

## 2013-02-27 ENCOUNTER — Ambulatory Visit (INDEPENDENT_AMBULATORY_CARE_PROVIDER_SITE_OTHER): Payer: Medicare Other | Admitting: Family Medicine

## 2013-02-27 VITALS — BP 150/72 | HR 88 | Temp 98.3°F | Ht 66.3 in | Wt 139.8 lb

## 2013-02-27 DIAGNOSIS — J449 Chronic obstructive pulmonary disease, unspecified: Secondary | ICD-10-CM

## 2013-02-27 DIAGNOSIS — E785 Hyperlipidemia, unspecified: Secondary | ICD-10-CM

## 2013-02-27 DIAGNOSIS — R7309 Other abnormal glucose: Secondary | ICD-10-CM

## 2013-02-27 DIAGNOSIS — R030 Elevated blood-pressure reading, without diagnosis of hypertension: Secondary | ICD-10-CM | POA: Diagnosis not present

## 2013-02-27 DIAGNOSIS — Z0000 Encounter for general adult medical examination without abnormal findings: Secondary | ICD-10-CM

## 2013-02-27 NOTE — Assessment & Plan Note (Signed)
Follow at home. Recheck in 1 month.. Smtop smoking.aeblife.  Likely will need med to treat.

## 2013-02-27 NOTE — Progress Notes (Signed)
Pre-visit discussion using our clinic review tool. No additional management support is needed unless otherwise documented below in the visit note.  

## 2013-02-27 NOTE — Assessment & Plan Note (Addendum)
Spirometry today: NML

## 2013-02-27 NOTE — Patient Instructions (Addendum)
Get BP cuff at home. Follow BP at home.. Record measurements, goal BP < 140/90.  Call/ MY Chart in 1-2 weeks with measurements.  Stop smoking. Follow up in 1 month for BP check with Dr. Leonard Schwartz. Work on increasing exercise as able. Exchange animal fats for veggie fats. Look into shingles vaccine coverage.

## 2013-02-27 NOTE — Assessment & Plan Note (Signed)
Well controlled. Continue current medication. Goal < 70 given carotid stenosis.

## 2013-02-27 NOTE — Assessment & Plan Note (Signed)
Resolved

## 2013-02-27 NOTE — Progress Notes (Signed)
HPI  I have personally reviewed the Medicare Annual Wellness questionnaire and have noted  1. The patient's medical and social history  2. Their use of alcohol, tobacco or illicit drugs  3. Their current medications and supplements  4. The patient's functional ability including ADL's, fall risks, home safety risks and hearing or visual  impairment.  5. Diet and physical activities  6. Evidence for depression or mood disorders  The patients weight, height, BMI and visual acuity have been recorded in the chart  I have made referrals, counseling and provided education to the patient based review of the above and I have provided the pt with a written personalized care plan for preventive services.   Elevated Cholesterol:On Vytorin.. At Goal LDL<70  Lab Results  Component Value Date   CHOL 118 02/20/2013   HDL 32.70* 02/20/2013   LDLCALC 69 02/20/2013   LDLDIRECT 176.9 05/09/2008   TRIG 83.0 02/20/2013   CHOLHDL 4 02/20/2013   Using medications without problems:None  Muscle aches: None  Diet compliance: Fair  Exercise:working in yard. Walking dogs.  Other complaints:   B carotid stenosis, claudication....followed by Vascular MD, left at last OV. No sig change in 11/2012 carotids.  BP Inadequate control. No history of HTN..  BP Readings from Last 3 Encounters:  02/27/13 150/72  12/06/12 183/83  12/10/11 142/78   Using aleve for left hip pain.Marland Kitchen Helps.  BPH: stable control on flomax.   Prediabetes:Stable control, almost resolved with lifestyle changes.   COPD: still smoking 1-2 packs per day... Not interested in quitting. No acute bronchitis issues, no cough. No wheeze.  Has smoked 42 years... Not interested in CXR screen.Marland Kitchen He is asymptomatic.Marland Kitchen Last nml CXR in 2009-2010 prior to surgery.   Review of Systems  Constitutional: Negative for fever, fatigue and unexpected weight change.  HENT: Negative for ear pain, congestion, sore throat, rhinorrhea, trouble swallowing and postnasal drip.   Eyes: Negative for pain.  Respiratory: Negative for cough, shortness of breath and wheezing.  Cardiovascular: Negative for chest pain, palpitations and leg swelling.  Gastrointestinal: Negative for nausea, abdominal pain, diarrhea, constipation and blood in stool.  Genitourinary: Negative for dysuria, urgency, hematuria, discharge, penile swelling, scrotal swelling, difficulty urinating, penile pain and testicular pain.  Skin: Negative for rash.  Neurological: Negative for syncope, weakness, light-headedness, numbness and headaches.  Psychiatric/Behavioral: Negative for behavioral problems and dysphoric mood. The patient is not nervous/anxious.  History    Social History   .  Marital Status:  Married     Spouse Name:  N/A     Number of Children:  N/A   .  Years of Education:  N/A    Occupational History   .  Not on file.    Social History Main Topics   .  Smoking status:  Current Every Day Smoker -- 1.5 packs/day for .5 years     Types:  Cigarettes   .  Smokeless tobacco:  Not on file   .  Alcohol Use:  No   .  Drug Use:  No   .  Sexually Active:     Other Topics  Concern   .  Not on file    Social History Narrative    No living will, no HCPOA. (reviewed 2013)    Objective:   Physical Exam  Constitutional: He appears well-developed and well-nourished. Non-toxic appearance. He does not appear ill. No distress.  HENT:  Head: Normocephalic and atraumatic.  Right Ear: Hearing, tympanic membrane, external ear and  ear canal normal.  Left Ear: Hearing, tympanic membrane, external ear and ear canal normal.  Nose: Nose normal.  Mouth/Throat: Uvula is midline, oropharynx is clear and moist and mucous membranes are normal.  Eyes: Conjunctivae, EOM and lids are normal. Pupils are equal, round, and reactive to light. No foreign bodies found.  Neck: Trachea normal, normal range of motion and phonation normal. Neck supple. Carotid bruit is not present. No mass and no thyromegaly  present.  Cardiovascular: Normal rate, regular rhythm, S1 normal, S2 normal, intact distal pulses and normal pulses. Exam reveals no gallop.  No murmur heard.  Pulmonary/Chest: Breath sounds normal. He has no wheezes. He has no rhonchi. He has no rales.  Abdominal: Soft. Normal appearance and bowel sounds are normal. There is no hepatosplenomegaly. There is no tenderness. There is no rebound, no guarding and no CVA tenderness. No hernia. Hernia confirmed negative in the right inguinal area and confirmed negative in the left inguinal area.  Lymphadenopathy:  He has no cervical adenopathy.  Right: No inguinal adenopathy present.  Left: No inguinal adenopathy present.  Neurological: He is alert. He has normal strength and normal reflexes. No cranial nerve deficit or sensory deficit. Gait normal.  Skin: Skin is warm, dry and intact. No rash noted.  Psychiatric: He has a normal mood and affect. His speech is normal and behavior is normal. Judgment normal.  Assessment & Plan:   AMW: The patient's preventative maintenance and recommended screening tests for an annual wellness exam were reviewed in full today.  Brought up to date unless services declined.  Counselled on the importance of diet, exercise, and its role in overall health and mortality.  The patient's FH and SH was reviewed, including their home life, tobacco status, and drug and alcohol status.   Td, PNA: Up to date  Shingles:not interested  Flu: not interested  PSA: Hx of BPH on tamulosin. No further PSA testing indicated, pt agreeable to stopping Colon: 06/2008 Dr. Jarold Motto, repeat 10 years hyperplastic polyps  Smoker: CXR/spirometry. Refused CXR again this year, spirometry performed 2012.. Mild obstruction, not interested in spiriva.  Repeat spirometry today NORMAL!  No daily cough,occ acute bronchitis rarely.

## 2013-03-09 ENCOUNTER — Other Ambulatory Visit: Payer: Self-pay | Admitting: Family Medicine

## 2013-05-08 ENCOUNTER — Other Ambulatory Visit: Payer: Self-pay | Admitting: Vascular Surgery

## 2013-05-08 DIAGNOSIS — I6529 Occlusion and stenosis of unspecified carotid artery: Secondary | ICD-10-CM

## 2013-05-08 DIAGNOSIS — Z48812 Encounter for surgical aftercare following surgery on the circulatory system: Secondary | ICD-10-CM

## 2013-06-04 ENCOUNTER — Other Ambulatory Visit: Payer: Self-pay | Admitting: *Deleted

## 2013-06-04 MED ORDER — EZETIMIBE-SIMVASTATIN 10-40 MG PO TABS: ORAL_TABLET | ORAL | Status: DC

## 2013-06-04 MED ORDER — TAMSULOSIN HCL 0.4 MG PO CAPS: ORAL_CAPSULE | ORAL | Status: DC

## 2013-07-09 ENCOUNTER — Encounter: Payer: Self-pay | Admitting: Internal Medicine

## 2013-08-30 ENCOUNTER — Encounter: Payer: Self-pay | Admitting: Gastroenterology

## 2013-09-10 ENCOUNTER — Emergency Department (HOSPITAL_COMMUNITY): Payer: Medicare Other

## 2013-09-10 ENCOUNTER — Inpatient Hospital Stay (HOSPITAL_COMMUNITY)
Admission: EM | Admit: 2013-09-10 | Discharge: 2013-09-13 | DRG: 247 | Disposition: A | Discharge status: Home / Self Care | Payer: Medicare Other | Attending: Internal Medicine | Admitting: Internal Medicine

## 2013-09-10 ENCOUNTER — Encounter (HOSPITAL_COMMUNITY): Payer: Self-pay | Admitting: Emergency Medicine

## 2013-09-10 ENCOUNTER — Inpatient Hospital Stay (HOSPITAL_COMMUNITY): Payer: Medicare Other

## 2013-09-10 DIAGNOSIS — R0609 Other forms of dyspnea: Secondary | ICD-10-CM | POA: Diagnosis present

## 2013-09-10 DIAGNOSIS — Z79899 Other long term (current) drug therapy: Secondary | ICD-10-CM | POA: Diagnosis not present

## 2013-09-10 DIAGNOSIS — R7989 Other specified abnormal findings of blood chemistry: Secondary | ICD-10-CM

## 2013-09-10 DIAGNOSIS — I214 Non-ST elevation (NSTEMI) myocardial infarction: Secondary | ICD-10-CM | POA: Diagnosis present

## 2013-09-10 DIAGNOSIS — Z8249 Family history of ischemic heart disease and other diseases of the circulatory system: Secondary | ICD-10-CM | POA: Diagnosis not present

## 2013-09-10 DIAGNOSIS — F17200 Nicotine dependence, unspecified, uncomplicated: Secondary | ICD-10-CM | POA: Diagnosis present

## 2013-09-10 DIAGNOSIS — I447 Left bundle-branch block, unspecified: Secondary | ICD-10-CM | POA: Diagnosis present

## 2013-09-10 DIAGNOSIS — Z7982 Long term (current) use of aspirin: Secondary | ICD-10-CM | POA: Diagnosis not present

## 2013-09-10 DIAGNOSIS — I2489 Other forms of acute ischemic heart disease: Secondary | ICD-10-CM | POA: Diagnosis present

## 2013-09-10 DIAGNOSIS — Z885 Allergy status to narcotic agent status: Secondary | ICD-10-CM

## 2013-09-10 DIAGNOSIS — J984 Other disorders of lung: Secondary | ICD-10-CM

## 2013-09-10 DIAGNOSIS — R911 Solitary pulmonary nodule: Secondary | ICD-10-CM | POA: Diagnosis not present

## 2013-09-10 DIAGNOSIS — R0989 Other specified symptoms and signs involving the circulatory and respiratory systems: Secondary | ICD-10-CM | POA: Diagnosis not present

## 2013-09-10 DIAGNOSIS — I249 Acute ischemic heart disease, unspecified: Secondary | ICD-10-CM

## 2013-09-10 DIAGNOSIS — R0602 Shortness of breath: Secondary | ICD-10-CM | POA: Diagnosis not present

## 2013-09-10 DIAGNOSIS — I498 Other specified cardiac arrhythmias: Secondary | ICD-10-CM | POA: Diagnosis present

## 2013-09-10 DIAGNOSIS — J439 Emphysema, unspecified: Secondary | ICD-10-CM

## 2013-09-10 DIAGNOSIS — I248 Other forms of acute ischemic heart disease: Secondary | ICD-10-CM | POA: Diagnosis present

## 2013-09-10 DIAGNOSIS — J438 Other emphysema: Secondary | ICD-10-CM | POA: Diagnosis not present

## 2013-09-10 DIAGNOSIS — J4489 Other specified chronic obstructive pulmonary disease: Secondary | ICD-10-CM | POA: Diagnosis present

## 2013-09-10 DIAGNOSIS — E785 Hyperlipidemia, unspecified: Secondary | ICD-10-CM | POA: Diagnosis present

## 2013-09-10 DIAGNOSIS — I5021 Acute systolic (congestive) heart failure: Secondary | ICD-10-CM | POA: Diagnosis not present

## 2013-09-10 DIAGNOSIS — Z955 Presence of coronary angioplasty implant and graft: Secondary | ICD-10-CM

## 2013-09-10 DIAGNOSIS — N400 Enlarged prostate without lower urinary tract symptoms: Secondary | ICD-10-CM | POA: Diagnosis present

## 2013-09-10 DIAGNOSIS — I6523 Occlusion and stenosis of bilateral carotid arteries: Secondary | ICD-10-CM | POA: Diagnosis present

## 2013-09-10 DIAGNOSIS — I2510 Atherosclerotic heart disease of native coronary artery without angina pectoris: Secondary | ICD-10-CM | POA: Diagnosis present

## 2013-09-10 DIAGNOSIS — J449 Chronic obstructive pulmonary disease, unspecified: Secondary | ICD-10-CM | POA: Diagnosis present

## 2013-09-10 DIAGNOSIS — I739 Peripheral vascular disease, unspecified: Secondary | ICD-10-CM | POA: Diagnosis present

## 2013-09-10 DIAGNOSIS — I10 Essential (primary) hypertension: Secondary | ICD-10-CM | POA: Diagnosis present

## 2013-09-10 DIAGNOSIS — I509 Heart failure, unspecified: Secondary | ICD-10-CM | POA: Diagnosis present

## 2013-09-10 DIAGNOSIS — R778 Other specified abnormalities of plasma proteins: Secondary | ICD-10-CM

## 2013-09-10 DIAGNOSIS — R0603 Acute respiratory distress: Secondary | ICD-10-CM

## 2013-09-10 DIAGNOSIS — I359 Nonrheumatic aortic valve disorder, unspecified: Secondary | ICD-10-CM

## 2013-09-10 DIAGNOSIS — I200 Unstable angina: Secondary | ICD-10-CM | POA: Diagnosis not present

## 2013-09-10 HISTORY — DX: Systemic involvement of connective tissue, unspecified: M35.9

## 2013-09-10 LAB — BASIC METABOLIC PANEL
BUN: 21 mg/dL (ref 6–23)
CO2: 24 mEq/L (ref 19–32)
Calcium: 8.8 mg/dL (ref 8.4–10.5)
Chloride: 100 mEq/L (ref 96–112)
Creatinine, Ser: 0.87 mg/dL (ref 0.50–1.35)
GFR calc Af Amer: >90 mL/min (ref >90)
GFR calc non Af Amer: 84 mL/min — ABNORMAL LOW (ref >90)
Glucose, Bld: 121 mg/dL — ABNORMAL HIGH (ref 70–99)
Potassium: 4.3 mEq/L (ref 3.7–5.3)
SODIUM: 139 meq/L (ref 137–147)

## 2013-09-10 LAB — CBC WITH DIFFERENTIAL/PLATELET
BASOS ABS: 0 10*3/uL (ref 0.0–0.1)
BASOS PCT: 0 % (ref 0–1)
EOS ABS: 0.2 10*3/uL (ref 0.0–0.7)
EOS PCT: 2 % (ref 0–5)
HEMATOCRIT: 37.8 % — AB (ref 39.0–52.0)
Hemoglobin: 12.5 g/dL — ABNORMAL LOW (ref 13.0–17.0)
Lymphocytes Relative: 11 % — ABNORMAL LOW (ref 12–46)
Lymphs Abs: 1.2 10*3/uL (ref 0.7–4.0)
MCH: 28.7 pg (ref 26.0–34.0)
MCHC: 33.1 g/dL (ref 30.0–36.0)
MCV: 86.7 fL (ref 78.0–100.0)
MONO ABS: 0.6 10*3/uL (ref 0.1–1.0)
Monocytes Relative: 5 % (ref 3–12)
Neutro Abs: 9.2 10*3/uL — ABNORMAL HIGH (ref 1.7–7.7)
Neutrophils Relative %: 82 % — ABNORMAL HIGH (ref 43–77)
PLATELETS: 205 10*3/uL (ref 150–400)
RBC: 4.36 MIL/uL (ref 4.22–5.81)
RDW: 14.6 % (ref 11.5–15.5)
WBC: 11.3 10*3/uL — ABNORMAL HIGH (ref 4.0–10.5)

## 2013-09-10 LAB — COMPREHENSIVE METABOLIC PANEL
ALBUMIN: 3.3 g/dL — AB (ref 3.5–5.2)
ALT: 30 U/L (ref 0–53)
AST: 35 U/L (ref 0–37)
Alkaline Phosphatase: 65 U/L (ref 39–117)
BUN: 24 mg/dL — ABNORMAL HIGH (ref 6–23)
CALCIUM: 8.7 mg/dL (ref 8.4–10.5)
CO2: 21 mEq/L (ref 19–32)
CREATININE: 0.91 mg/dL (ref 0.50–1.35)
Chloride: 101 mEq/L (ref 96–112)
GFR calc Af Amer: >90 mL/min (ref >90)
GFR calc non Af Amer: 83 mL/min — ABNORMAL LOW (ref >90)
Glucose, Bld: 167 mg/dL — ABNORMAL HIGH (ref 70–99)
Potassium: 4 mEq/L (ref 3.7–5.3)
SODIUM: 138 meq/L (ref 137–147)
TOTAL PROTEIN: 6.5 g/dL (ref 6.0–8.3)
Total Bilirubin: 0.2 mg/dL — ABNORMAL LOW (ref 0.3–1.2)

## 2013-09-10 LAB — HEMOGLOBIN A1C
Hgb A1c MFr Bld: 6.1 % — ABNORMAL HIGH (ref <5.7)
Mean Plasma Glucose: 128 mg/dL — ABNORMAL HIGH (ref <117)

## 2013-09-10 LAB — LIPID PANEL
Cholesterol: 116 mg/dL (ref 0–200)
HDL: 48 mg/dL (ref >39)
LDL CALC: 53 mg/dL (ref 0–99)
Total CHOL/HDL Ratio: 2.4 RATIO
Triglycerides: 73 mg/dL (ref <150)
VLDL: 15 mg/dL (ref 0–40)

## 2013-09-10 LAB — I-STAT TROPONIN, ED: TROPONIN I, POC: 0.64 ng/mL — AB (ref 0.00–0.08)

## 2013-09-10 LAB — I-STAT VENOUS BLOOD GAS, ED
ACID-BASE DEFICIT: 1 mmol/L (ref 0.0–2.0)
BICARBONATE: 23.1 meq/L (ref 20.0–24.0)
O2 SAT: 78 %
PO2 VEN: 41 mmHg (ref 30.0–45.0)
Patient temperature: 37
TCO2: 24 mmol/L (ref 0–100)
pCO2, Ven: 34.8 mmHg — ABNORMAL LOW (ref 45.0–50.0)
pH, Ven: 7.431 — ABNORMAL HIGH (ref 7.250–7.300)

## 2013-09-10 LAB — TROPONIN I
TROPONIN I: 0.62 ng/mL — AB (ref <0.30)
TROPONIN I: 0.83 ng/mL — AB (ref <0.30)
Troponin I: 0.79 ng/mL (ref <0.30)
Troponin I: 0.98 ng/mL (ref <0.30)

## 2013-09-10 LAB — PRO B NATRIURETIC PEPTIDE: Pro B Natriuretic peptide (BNP): 3101 pg/mL — ABNORMAL HIGH (ref 0–125)

## 2013-09-10 LAB — D-DIMER, QUANTITATIVE: D-Dimer, Quant: 2.32 ug/mL-FEU — ABNORMAL HIGH (ref 0.00–0.48)

## 2013-09-10 MED ORDER — SODIUM CHLORIDE 0.9 % IJ SOLN
3.0000 mL | Freq: Two times a day (BID) | INTRAMUSCULAR | Status: DC
  Administered 2013-09-10: 3 mL via INTRAVENOUS

## 2013-09-10 MED ORDER — ASPIRIN EC 81 MG PO TBEC
81.0000 mg | DELAYED_RELEASE_TABLET | Freq: Every day | ORAL | Status: DC
  Filled 2013-09-10: qty 1

## 2013-09-10 MED ORDER — TAMSULOSIN HCL 0.4 MG PO CAPS
0.4000 mg | ORAL_CAPSULE | Freq: Every day | ORAL | Status: DC
  Administered 2013-09-10 – 2013-09-13 (×4): 0.4 mg via ORAL
  Filled 2013-09-10 (×5): qty 1

## 2013-09-10 MED ORDER — LISINOPRIL 5 MG PO TABS
5.0000 mg | ORAL_TABLET | Freq: Two times a day (BID) | ORAL | Status: DC
  Administered 2013-09-10 – 2013-09-13 (×6): 5 mg via ORAL
  Filled 2013-09-10 (×10): qty 1

## 2013-09-10 MED ORDER — ASPIRIN 81 MG PO CHEW
81.0000 mg | CHEWABLE_TABLET | ORAL | Status: AC
  Administered 2013-09-11: 81 mg via ORAL
  Filled 2013-09-10: qty 1

## 2013-09-10 MED ORDER — LISINOPRIL 5 MG PO TABS
5.0000 mg | ORAL_TABLET | Freq: Every day | ORAL | Status: DC
  Filled 2013-09-10: qty 1

## 2013-09-10 MED ORDER — ACETAMINOPHEN 325 MG PO TABS: 650.0000 mg | ORAL_TABLET | ORAL | Status: DC | PRN

## 2013-09-10 MED ORDER — HEPARIN (PORCINE) IN NACL 100-0.45 UNIT/ML-% IJ SOLN
1100.0000 [IU]/h | INTRAMUSCULAR | Status: DC
  Administered 2013-09-10: 900 [IU]/h via INTRAVENOUS
  Filled 2013-09-10 (×2): qty 250

## 2013-09-10 MED ORDER — ENOXAPARIN SODIUM 80 MG/0.8ML ~~LOC~~ SOLN
65.0000 mg | Freq: Once | SUBCUTANEOUS | Status: AC
  Administered 2013-09-10: 65 mg via SUBCUTANEOUS
  Filled 2013-09-10: qty 0.8

## 2013-09-10 MED ORDER — FUROSEMIDE 10 MG/ML IJ SOLN
40.0000 mg | Freq: Once | INTRAMUSCULAR | Status: AC
  Administered 2013-09-10: 40 mg via INTRAVENOUS
  Filled 2013-09-10: qty 4

## 2013-09-10 MED ORDER — ZOLPIDEM TARTRATE 5 MG PO TABS: 5.0000 mg | ORAL_TABLET | Freq: Every evening | ORAL | Status: DC | PRN

## 2013-09-10 MED ORDER — ASPIRIN 81 MG PO CHEW
324.0000 mg | CHEWABLE_TABLET | Freq: Once | ORAL | Status: AC
  Administered 2013-09-10: 324 mg via ORAL
  Filled 2013-09-10: qty 4

## 2013-09-10 MED ORDER — FUROSEMIDE 10 MG/ML IJ SOLN: 40.0000 mg | Freq: Every day | INTRAMUSCULAR | Status: DC

## 2013-09-10 MED ORDER — LISINOPRIL 5 MG PO TABS: 5.0000 mg | ORAL_TABLET | Freq: Every day | ORAL | Status: DC

## 2013-09-10 MED ORDER — EZETIMIBE-SIMVASTATIN 10-40 MG PO TABS
1.0000 | ORAL_TABLET | Freq: Every day | ORAL | Status: DC
  Administered 2013-09-10 – 2013-09-12 (×3): 1 via ORAL
  Filled 2013-09-10 (×7): qty 1

## 2013-09-10 MED ORDER — HEPARIN (PORCINE) IN NACL 100-0.45 UNIT/ML-% IJ SOLN
900.0000 [IU]/h | INTRAMUSCULAR | Status: DC
  Filled 2013-09-10: qty 250

## 2013-09-10 MED ORDER — FUROSEMIDE 10 MG/ML IJ SOLN
40.0000 mg | Freq: Two times a day (BID) | INTRAMUSCULAR | Status: DC
  Administered 2013-09-10 (×2): 40 mg via INTRAVENOUS
  Filled 2013-09-10 (×4): qty 4

## 2013-09-10 MED ORDER — NITROGLYCERIN 0.4 MG SL SUBL: 0.4000 mg | SUBLINGUAL_TABLET | SUBLINGUAL | Status: DC | PRN

## 2013-09-10 MED ORDER — ONDANSETRON HCL 4 MG/2ML IJ SOLN: 4.0000 mg | Freq: Four times a day (QID) | INTRAMUSCULAR | Status: DC | PRN

## 2013-09-10 MED ORDER — IOHEXOL 350 MG/ML SOLN
100.0000 mL | Freq: Once | INTRAVENOUS | Status: AC | PRN
  Administered 2013-09-10: 100 mL via INTRAVENOUS

## 2013-09-10 MED ORDER — ALPRAZOLAM 0.25 MG PO TABS: 0.2500 mg | ORAL_TABLET | Freq: Two times a day (BID) | ORAL | Status: DC | PRN

## 2013-09-10 MED ORDER — METOPROLOL TARTRATE 1 MG/ML IV SOLN
5.0000 mg | Freq: Once | INTRAVENOUS | Status: AC
  Administered 2013-09-10: 5 mg via INTRAVENOUS
  Filled 2013-09-10: qty 5

## 2013-09-10 MED ORDER — SODIUM CHLORIDE 0.9 % IV SOLN: 250.0000 mL | INTRAVENOUS | Status: DC | PRN

## 2013-09-10 MED ORDER — SODIUM CHLORIDE 0.9 % IJ SOLN: 3.0000 mL | INTRAMUSCULAR | Status: DC | PRN

## 2013-09-10 NOTE — Progress Notes (Signed)
Subjective:  No chest pain, SOB improved  Objective:  Vital Signs in the last 24 hours: Temp:  [96.7 F (35.9 C)-98.3 F (36.8 C)] 98.3 F (36.8 C) (06/29 0541) Pulse Rate:  [90-115] 104 (06/29 0541) Resp:  [17-25] 18 (06/29 0541) BP: (132-158)/(67-85) 158/72 mmHg (06/29 0541) SpO2:  [96 %-100 %] 96 % (06/29 0541) Weight:  [140 lb 14 oz (63.9 kg)] 140 lb 14 oz (63.9 kg) (06/29 0541)  Intake/Output from previous day:  Intake/Output Summary (Last 24 hours) at 09/10/13 0844 Last data filed at 09/10/13 0530  Gross per 24 hour  Intake      0 ml  Output    675 ml  Net   -675 ml    Physical Exam: General appearance: alert, cooperative and no distress Lungs: basilar crackles and rhonchi Heart: regular rate and rhythm   Rate: 90  Rhythm: normal sinus rhythm and LBBB  Lab Results:  Recent Labs  09/10/13 0209  WBC 11.3*  HGB 12.5*  PLT 205    Recent Labs  09/10/13 0209 09/10/13 0656  NA 138 139  K 4.0 4.3  CL 101 100  CO2 21 24  GLUCOSE 167* 121*  BUN 24* 21  CREATININE 0.91 0.87    Recent Labs  09/10/13 0209 09/10/13 0658  TROPONINI 0.62* 0.79*   No results found for this basename: INR,  in the last 72 hours  Imaging: Imaging results have been reviewed  General: NAD Neck: JVP 10 cm, no thyromegaly or thyroid nodule.  Lungs: Distant breath sounds, slight crackles at bases. CV: Nondisplaced PMI.  Heart regular S1/S2, no S3/S4, 2/6 SEM RUSB.  No peripheral edema.  No carotid bruit.  Normal pedal pulses.  Abdomen: Soft, nontender, no hepatosplenomegaly, no distention.  Skin: Intact without lesions or rashes.  Neurologic: Alert and oriented x 3.  Psych: Normal affect. Extremities: No clubbing or cyanosis.  HEENT: Normal.    Assessment/Plan:   Principal Problem:   Acute congestive heart failure Active Problems:   NSTEMI (non-ST elevated myocardial infarction)   CAROTID ARTERY STENOSIS, BILATERAL- s/p Rt and Lt CEA 2010   COPD, mild  HYPERLIPIDEMIA   TOBACCO ABUSE   Family history of coronary artery disease in brother    PLAN: Will review timing of cath with MD- CHF appears compensated but he had CTA earlier this am. Check echo today.   Kerin Ransom PA-C Beeper [phone] 09/10/2013, 8:44 AM  Patient seen with PA, agree with the above note.   Patient presented last night with suspected acute systolic CHF.   He has a history of vascular disease.  BNP was very high, TnI mildly elevated without trend.  He feels much less short of breath today with IV lasix last night.  1. Acute CHF, suspected systolic: Patient has had exertional dyspnea x several months, worse x several weeks, last night with severe orthopnea (has had some orthopnea for weeks) and decided to come to ER because he could not lie down in bed.  Feels much better this morning with IV Lasix.  TnI elevated mildly but no significant trend.  BNP very high, pulmonary edema on CTA, volume overloaded on exam.  My suspicion is ischemic cardiomyopathy but without active ACS (elevated troponin as demand ischemia).  - Increase Lasix to 40 mg IV bid (good response so far).  - Increase lisinopril to 5 mg bid. - Will need to start beta blocker (prefer bisoprolol with COPD) eventually.   - Echo today . 2. CAD:  Elevated troponin, mildly with no trend.  Suspect this is demand ischemia from acute CHF.  No chest pain.  I am suspicious, however, for ischemic cardiomyopathy given his known vascular disease and active smoking.  Given symptoms for months with gradual onset, suspect more chronic CAD, perhaps old out of hospital MI (ECG with LBBB, no prior).   - Patient had contrast with CTA chest this morning and got a dose of Lovenox.  Would hold off on LHC today (doubt active ACS) and will plan LHC tomorrow.  3. COPD: Active smoker.  Says that he "stopped this morning."   4. H/o bilteral CEAs.   [name] 09/10/2013 9:03 AM

## 2013-09-10 NOTE — Progress Notes (Signed)
ANTICOAGULATION CONSULT NOTE - Initial Consult  Pharmacy Consult for Heparin  Indication: chest pain/ACS  Allergies  Allergen Reactions  . Codeine Swelling    REACTION: Throat swells    Patient Measurements: Height: 5\' 7"  (170.2 cm) Weight: 140 lb 14 oz (63.9 kg) IBW/kg (Calculated) : 66.1  Vital Signs: Temp: 98.3 F (36.8 C) (06/29 0541) Temp src: Oral (06/29 0541) BP: 158/72 mmHg (06/29 0541) Pulse Rate: 104 (06/29 0541)  Labs:  Recent Labs  09/10/13 0209  HGB 12.5*  HCT 37.8*  PLT 205  CREATININE 0.91  TROPONINI 0.62*    Estimated Creatinine Clearance: 66.3 ml/min (by C-G formula based on Cr of 0.91).   Medical History: Past Medical History  Diagnosis Date  . Carotid artery occlusion   . Hyperlipidemia   . COPD (chronic obstructive pulmonary disease)   . BPH (benign prostatic hypertrophy)   . History of colonic polyps   . Hypertension   . Heart murmur   . Collagen vascular disease    Assessment: 73 y/o M to start heparin for elevated troponin. Pt was given full dose Lovenox in the ED at 0310. Hgb 12.5, renal function ok, other labs as above.   Goal of Therapy:  Heparin level 0.3-0.7 units/ml Monitor platelets by anticoagulation protocol: Yes   Plan:  -Start heparin at 900 units/hr at 1500 -HL at midnight (0000)  -Daily CBC/HL -Monitor for bleeding  Narda Bonds 09/10/2013,5:47 AM

## 2013-09-10 NOTE — ED Provider Notes (Signed)
CSN: [id number]     Arrival date & time 09/10/13  0119 History   First MD Initiated Contact with Patient 09/10/13 0131     Chief Complaint  Patient presents with  . Respiratory Distress     (Consider location/radiation/quality/duration/timing/severity/associated sxs/prior Treatment) HPI This is a 73 year old man with a history of peripheral vascular disease, hyperlipidemia and COPD along with one pack per day smoking habit. The patient has a history of hypertension as well.  He is brought to the emergency department by EMS with shortness of breath. The patient has had increasingly severe shortness of breath for the past week. He has a minimal cough. No fever or chest pain. He finds that he feels more short of breath when he is laying supine. Tonight, he awoke from sleep feeling panicked and short of breath and asked his wife to call 911. He denies lower extremity edema. He denies history of similar symptoms.  The patient denies any known history of coronary artery disease and says that although to the best of his knowledge he has never undergone any cardiac workup.  Past Medical History  Diagnosis Date  . Carotid artery occlusion   . Hyperlipidemia   . COPD (chronic obstructive pulmonary disease)   . BPH (benign prostatic hypertrophy)   . History of colonic polyps   . Hypertension   . Heart murmur    Past Surgical History  Procedure Laterality Date  . Cholecystectomy      Gall Bladder  . Hernia repair    . Carotid endarterectomy  04/17/2008    right  . Carotid endarterectomy  05/30/08    Left   Family History  Problem Relation Age of Onset  . Heart disease Brother   . Hyperlipidemia Brother   . Hypertension Mother   . Cancer Father   . Hyperlipidemia Brother   . Hyperlipidemia Brother   . Heart attack Brother   . Hyperlipidemia Brother   . Multiple sclerosis Brother    History  Substance Use Topics  . Smoking status: Current Every Day Smoker -- 1.50 packs/day for .5  years    Types: Cigarettes  . Smokeless tobacco: Never Used  . Alcohol Use: No    Review of Systems  Ten point review of symptoms performed and is negative with the exception of symptoms noted above.   Allergies  Codeine  Home Medications   Prior to Admission medications   Medication Sig Start Date End Date Taking? Authorizing Eligha Kmetz  aspirin 81 MG tablet Take 81 mg by mouth daily.      Historical Julianny Milstein, MD  ezetimibe-simvastatin (VYTORIN) 10-40 MG per tablet TAKE 1 TABLET AT BEDTIME 06/04/13   Amy E [name], MD  fish oil-omega-3 fatty acids 1000 MG capsule Take 1 g by mouth daily.      Historical Deanna Wiater, MD  ibuprofen (ADVIL,MOTRIN) 200 MG tablet Take 200 mg by mouth as needed for pain.    Historical Aastha Dayley, MD  Naproxen Sodium (ALEVE PO) Take by mouth as needed.    Historical Brick Ketcher, MD  tamsulosin (FLOMAX) 0.4 MG CAPS capsule TAKE 1 CAPSULE DAILY 06/04/13   Amy E [name], MD   BP 150/85  Pulse 114  Temp(Src) 96.7 F (35.9 C) (Oral)  Resp 25  SpO2 99% Physical Exam Gen: well developed and well nourished appearing, appears to be in distress Head: NCAT Eyes: PERL, EOMI Nose: no epistaixis or rhinorrhea Mouth/throat: mucosa is moist and pink Neck: supple, no stridor, + JVD Lungs: CTA B, no wheezing,  no rhonchi, faint bibasilar rales CV: rapid and regular, no murmur, extremities appear well perfused.  Abd: soft, notender, nondistended Back: no ttp, no cva ttp Skin: warm and dry Ext: normal to inspection, no dependent edema Neuro: CN ii-xii grossly intact, no focal deficits Psyche; mildly anxious affect,  calm and cooperative.   ED Course  Procedures (including critical care time) Labs Review  Results for orders placed during the hospital encounter of 09/10/13 (from the past 24 hour(s))  PRO B NATRIURETIC PEPTIDE     Status: Abnormal   Collection Time    09/10/13  2:09 AM      Result Value Ref Range   Pro B Natriuretic peptide (BNP) 3101.0 (*) 0 - 125  pg/mL  CBC WITH DIFFERENTIAL     Status: Abnormal   Collection Time    09/10/13  2:09 AM      Result Value Ref Range   WBC 11.3 (*) 4.0 - 10.5 K/uL   RBC 4.36  4.22 - 5.81 MIL/uL   Hemoglobin 12.5 (*) 13.0 - 17.0 g/dL   HCT 37.8 (*) 39.0 - 52.0 %   MCV 86.7  78.0 - 100.0 fL   MCH 28.7  26.0 - 34.0 pg   MCHC 33.1  30.0 - 36.0 g/dL   RDW 14.6  11.5 - 15.5 %   Platelets 205  150 - 400 K/uL   Neutrophils Relative % 82 (*) 43 - 77 %   Neutro Abs 9.2 (*) 1.7 - 7.7 K/uL   Lymphocytes Relative 11 (*) 12 - 46 %   Lymphs Abs 1.2  0.7 - 4.0 K/uL   Monocytes Relative 5  3 - 12 %   Monocytes Absolute 0.6  0.1 - 1.0 K/uL   Eosinophils Relative 2  0 - 5 %   Eosinophils Absolute 0.2  0.0 - 0.7 K/uL   Basophils Relative 0  0 - 1 %   Basophils Absolute 0.0  0.0 - 0.1 K/uL  COMPREHENSIVE METABOLIC PANEL     Status: Abnormal   Collection Time    09/10/13  2:09 AM      Result Value Ref Range   Sodium 138  137 - 147 mEq/L   Potassium 4.0  3.7 - 5.3 mEq/L   Chloride 101  96 - 112 mEq/L   CO2 21  19 - 32 mEq/L   Glucose, Bld 167 (*) 70 - 99 mg/dL   BUN 24 (*) 6 - 23 mg/dL   Creatinine, Ser 0.91  0.50 - 1.35 mg/dL   Calcium 8.7  8.4 - 10.5 mg/dL   Total Protein 6.5  6.0 - 8.3 g/dL   Albumin 3.3 (*) 3.5 - 5.2 g/dL   AST 35  0 - 37 U/L   ALT 30  0 - 53 U/L   Alkaline Phosphatase 65  39 - 117 U/L   Total Bilirubin 0.2 (*) 0.3 - 1.2 mg/dL   GFR calc non Af Amer 83 (*) >90 mL/min   GFR calc Af Amer >90  >90 mL/min  TROPONIN I     Status: Abnormal   Collection Time    09/10/13  2:09 AM      Result Value Ref Range   Troponin I 0.62 (*) <0.30 ng/mL  I-STAT TROPOININ, ED     Status: Abnormal   Collection Time    09/10/13  2:12 AM      Result Value Ref Range   Troponin i, poc 0.64 (*) 0.00 - 0.08 ng/mL  Comment NOTIFIED PHYSICIAN     Comment 3           I-STAT VENOUS BLOOD GAS, ED     Status: Abnormal   Collection Time    09/10/13  2:20 AM      Result Value Ref Range   pH, Ven 7.431 (*)  7.250 - 7.300   pCO2, Ven 34.8 (*) 45.0 - 50.0 mmHg   pO2, Ven 41.0  30.0 - 45.0 mmHg   Bicarbonate 23.1  20.0 - 24.0 mEq/L   TCO2 24  0 - 100 mmol/L   O2 Saturation 78.0     Acid-base deficit 1.0  0.0 - 2.0 mmol/L   Patient temperature 37.0 C     Sample type VENOUS    D-DIMER, QUANTITATIVE     Status: Abnormal   Collection Time    09/10/13  2:35 AM      Result Value Ref Range   D-Dimer, Quant 2.32 (*) 0.00 - 0.48 ug/mL-FEU   EKG: LBBB pattern, sinus tachycardia, abnormal intervals, no old ekgs for comparison, left axis deviation.   CXR: normal cardiac silloute, normal appearing mediastinum, pulmonary vascular congestion  CRITICAL CARE Performed by: Elyn Peers   Total critical care time: 52m  Critical care time was exclusive of separately billable procedures and treating other patients.  Critical care was necessary to treat or prevent imminent or life-threatening deterioration.  Critical care was time spent personally by me on the following activities: development of treatment plan with patient and/or surrogate as well as nursing, discussions with consultants, evaluation of patient's response to treatment, examination of patient, obtaining history from patient or surrogate, ordering and performing treatments and interventions, ordering and review of laboratory studies, ordering and review of radiographic studies, pulse oximetry and re-evaluation of patient's condition.   MDM   Differential diagnosis includes pulmonary embolus, COPD exacerbation, ACS, decompensated heart failure, pneumonia, pleural effusion.  The patient appears to have pulmonary venous congestion on chest x-ray. His troponin is mildly elevated at 0.6 in the face of normal renal function. He is also noted to have an elevated d-dimer at 2.3. We're treating appear clear with the first dose of Lovenox. I have ordered a CTA of the chest. We are treating with IV Lasix. Case discussed with interventional is on-call in  light of the patient's left bundle branch block which has been previously not established. He does not believe that this is the case and needs to the Cath Lab emergently. We are treating with aspirin and metoprolol along with the above medications. The cardiology fellow has seen the patient [facility].   Elyn Peers, MD 09/10/13 [phone]

## 2013-09-10 NOTE — ED Notes (Signed)
Per EMS: pt called out for SOB. Pt was tripoding when EMS got there diaphoretic as well. Unable to get an pulse ox reading on RA. Pt placed on CPAP. Pt diminished throughout. Pt given total 7.5 albuterol and 0.5 atrovent. Pt has history of COPD.

## 2013-09-10 NOTE — Progress Notes (Signed)
Echocardiogram 2D Echocardiogram has been performed.  [name], Erik 09/10/2013, 1:42 PM

## 2013-09-10 NOTE — H&P (Addendum)
Cardiology H& P  Note  Patient ID: Erik [name], MRN: [id number], DOB/AGE: [birth year] 72 y.o. Admit date: 09/10/2013   Date of Consult: 09/10/2013 Primary Physician: [name], MD Primary Cardiologist: Nill   Chief Complaint: SOB  Reason for Consult: ACS    Assessment and Plan:  -ACS -with elevated troponin  -Decompensated heart failure likely acute on chronic  -HTN  -COPD   Plan  -Start Aspirin , statin , heparin  -Check lipids  -Keep NPO for possible LHC  -Echocardiogram to eval LV function  -Cont Duonebs and home INH  -D/c NSAID in pt with CHF and ACS, and start lasix and lisinopril. Once more compensated will need B-blocker    73 yr old male with hx of COPD, HTN , PAD with right carotid endarterectomy , smoker here with SOB   HPI:pt states that he has chronic SOB that has gradually been worsening over the past few months. Over the past one week it was particularly worse and this is when he decided to come to the hospital. Pt reports 2 pillow orthopnea. No pND , le edema chest pain, focal weakness, syncope, bleeding diathesis , claudication , palpitation etc .  Reports medication compliance In the ED pt's EKG showed LBBB, Trop 0.6 .  ER physician discussed with the interventionalist on call Dr Irish Lack who did not feel that this met criteria for Acute STEMI, particularly since the pt denies any chest pain and symptoms have been ongoing for several days .     Past Medical History  Diagnosis Date  . Carotid artery occlusion   . Hyperlipidemia   . COPD (chronic obstructive pulmonary disease)   . BPH (benign prostatic hypertrophy)   . History of colonic polyps   . Hypertension   . Heart murmur       Most Recent Cardiac Studies: Trop I 0.64 BNP 3101 EKG 09/10/2013    Surgical History:  Past Surgical History  Procedure Laterality Date  . Cholecystectomy      Gall Bladder  . Hernia repair    . Carotid endarterectomy  04/17/2008    right  . Carotid  endarterectomy  05/30/08    Left     Home Meds: Prior to Admission medications   Medication Sig Start Date End Date Taking? Authorizing Provider  aspirin 81 MG tablet Take 81 mg by mouth daily.     Yes Historical Provider, MD  ezetimibe-simvastatin (VYTORIN) 10-40 MG per tablet Take 1 tablet by mouth at bedtime.   Yes Historical Provider, MD  fish oil-omega-3 fatty acids 1000 MG capsule Take 1 g by mouth daily.     Yes Historical Provider, MD  ibuprofen (ADVIL,MOTRIN) 200 MG tablet Take 200 mg by mouth as needed for pain.   Yes Historical Provider, MD  Naproxen Sodium (ALEVE PO) Take 1 tablet by mouth as needed (general pain).    Yes Historical Provider, MD  tamsulosin (FLOMAX) 0.4 MG CAPS capsule Take 0.4 mg by mouth daily.   Yes Historical Provider, MD    Inpatient Medications:  . furosemide  40 mg Intravenous Once      Allergies:  Allergies  Allergen Reactions  . Codeine Swelling    REACTION: Throat swells    History   Social History  . Marital Status: Married    Spouse Name: N/A    Number of Children: N/A  . Years of Education: N/A   Occupational History  . Not on file.   Social History Main Topics  .  Smoking status: Current Every Day Smoker -- 1.50 packs/day for .5 years    Types: Cigarettes  . Smokeless tobacco: Never Used  . Alcohol Use: No  . Drug Use: No  . Sexual Activity: Not on file   Other Topics Concern  . Not on file   Social History Narrative   No living will, no HCPOA, full code. (reviewed 2014)                 Family History  Problem Relation Age of Onset  . Heart disease Brother   . Hyperlipidemia Brother   . Hypertension Mother   . Cancer Father   . Hyperlipidemia Brother   . Hyperlipidemia Brother   . Heart attack Brother   . Hyperlipidemia Brother   . Multiple sclerosis Brother      Review of Systems: General: negative for chills, fever, night sweats or weight changes.  Cardiovascular:see HPI  Respiratory: chronic SOB and  cough  Urologic: negative for hematuria Abdominal: negative for nausea, vomiting, diarrhea, bright red blood per rectum, melena, or hematemesis Neurologic: negative for visual changes, syncope, or dizziness All other systems reviewed and are otherwise negative except as noted above.  Labs:  Recent Labs  09/10/13 0209  TROPONINI 0.62*   Lab Results  Component Value Date   WBC 11.3* 09/10/2013   HGB 12.5* 09/10/2013   HCT 37.8* 09/10/2013   MCV 86.7 09/10/2013   PLT 205 09/10/2013    Recent Labs Lab 09/10/13 0209  NA 138  K 4.0  CL 101  CO2 21  BUN 24*  CREATININE 0.91  CALCIUM 8.7  PROT 6.5  BILITOT 0.2*  ALKPHOS 65  ALT 30  AST 35  GLUCOSE 167*   Lab Results  Component Value Date   CHOL 118 02/20/2013   HDL 32.70* 02/20/2013   LDLCALC 69 02/20/2013   TRIG 83.0 02/20/2013   Lab Results  Component Value Date   DDIMER 2.32* 09/10/2013    Radiology/Studies:  Dg Chest Port 1 View  09/10/2013   CLINICAL DATA:  Shortness of breath and cough.  EXAM: PORTABLE CHEST - 1 VIEW  COMPARISON:  Chest radiograph May 27, 2008  FINDINGS: The cardiac silhouette is upper limits of normal in size, accentuated by AP technique. Mediastinal silhouette is nonsuspicious. Strandy densities in lung bases with slight blunting of the costophrenic angles. No focal consolidation. No pneumothorax. Soft tissue planes and included osseous structures are nonsuspicious; Multiple EKG lines overlie the patient and may obscure subtle underlying pathology.  IMPRESSION: Borderline cardiomegaly, bibasilar strandy densities may reflect atelectasis with suspected small pleural effusions versus pleural thickening   Electronically Signed   By: Elon Alas   On: 09/10/2013 03:40     Physical Exam: Blood pressure 132/68, pulse 90, temperature 96.7 F (35.9 C), temperature source Oral, resp. rate 18, SpO2 98.00%. General: Well developed, well nourished, in no acute distress.  Neck: Negative for carotid  bruits. JVD  ~15cm . Lungs: distant lung sounds, biabasilar crackles  Heart: RRR with S1 S2. No murmurs, rubs, or gallops appreciated. Abdomen: Soft, non-tender, non-distended with normoactive bowel sounds. No hepatomegaly. No rebound/guarding. No obvious abdominal masses. Extremities: No clubbing or cyanosis. No edema.  Distal pedal pulses are 2+ and equal bilaterally. Neuro: Alert and oriented X 3. No facial asymmetry. No focal deficit. Moves all extremities spontaneously. Psych:  Responds to questions appropriately with a normal affect.       Cory Roughen, A M.D  09/10/2013, 4:27 AM

## 2013-09-10 NOTE — ED Notes (Signed)
EDP Manly aware of troponin.

## 2013-09-10 NOTE — ED Notes (Signed)
Cardiologist MD at bedside

## 2013-09-10 NOTE — ED Notes (Signed)
Transporting patient to new room assignment. 

## 2013-09-10 NOTE — ED Notes (Addendum)
Pt taken off BiPAP per MD Cheri Guppy. MD Cheri Guppy on phone with cardiologist to discuss possible STEMI. Pt denies CP. Pt denies SOB currently.

## 2013-09-10 NOTE — ED Notes (Signed)
Pt denies CP, N/V

## 2013-09-11 ENCOUNTER — Encounter (HOSPITAL_COMMUNITY)
Admission: EM | Disposition: A | Discharge status: Home / Self Care | Payer: Medicare Other | Source: Home / Self Care | Attending: Internal Medicine

## 2013-09-11 DIAGNOSIS — I2510 Atherosclerotic heart disease of native coronary artery without angina pectoris: Secondary | ICD-10-CM

## 2013-09-11 DIAGNOSIS — I5021 Acute systolic (congestive) heart failure: Secondary | ICD-10-CM | POA: Diagnosis not present

## 2013-09-11 DIAGNOSIS — I248 Other forms of acute ischemic heart disease: Secondary | ICD-10-CM | POA: Diagnosis not present

## 2013-09-11 DIAGNOSIS — J449 Chronic obstructive pulmonary disease, unspecified: Secondary | ICD-10-CM | POA: Diagnosis not present

## 2013-09-11 DIAGNOSIS — R0609 Other forms of dyspnea: Secondary | ICD-10-CM | POA: Diagnosis not present

## 2013-09-11 HISTORY — PX: PERCUTANEOUS STENT INTERVENTION: SHX5500

## 2013-09-11 HISTORY — PX: CORONARY ANGIOGRAM: SHX5466

## 2013-09-11 LAB — BASIC METABOLIC PANEL
BUN: 21 mg/dL (ref 6–23)
CO2: 26 mEq/L (ref 19–32)
Calcium: 8.7 mg/dL (ref 8.4–10.5)
Chloride: 98 mEq/L (ref 96–112)
Creatinine, Ser: 0.91 mg/dL (ref 0.50–1.35)
GFR calc Af Amer: >90 mL/min (ref >90)
GFR calc non Af Amer: 83 mL/min — ABNORMAL LOW (ref >90)
Glucose, Bld: 108 mg/dL — ABNORMAL HIGH (ref 70–99)
Potassium: 3.6 mEq/L — ABNORMAL LOW (ref 3.7–5.3)
Sodium: 139 mEq/L (ref 137–147)

## 2013-09-11 LAB — CBC
HEMATOCRIT: 37.6 % — AB (ref 39.0–52.0)
Hemoglobin: 12.5 g/dL — ABNORMAL LOW (ref 13.0–17.0)
MCH: 28.3 pg (ref 26.0–34.0)
MCHC: 33.2 g/dL (ref 30.0–36.0)
MCV: 85.3 fL (ref 78.0–100.0)
PLATELETS: 229 10*3/uL (ref 150–400)
RBC: 4.41 MIL/uL (ref 4.22–5.81)
RDW: 14.7 % (ref 11.5–15.5)
WBC: 8.5 10*3/uL (ref 4.0–10.5)

## 2013-09-11 LAB — PROTIME-INR
INR: 1.11 (ref 0.00–1.49)
Prothrombin Time: 14.3 seconds (ref 11.6–15.2)

## 2013-09-11 LAB — HEPARIN LEVEL (UNFRACTIONATED)
HEPARIN UNFRACTIONATED: 0.14 [IU]/mL — AB (ref 0.30–0.70)
Heparin Unfractionated: 0.37 IU/mL (ref 0.30–0.70)

## 2013-09-11 LAB — POCT ACTIVATED CLOTTING TIME: Activated Clotting Time: 377 seconds

## 2013-09-11 SURGERY — PERCUTANEOUS STENT INTERVENTION

## 2013-09-11 MED ORDER — MIDAZOLAM HCL 2 MG/2ML IJ SOLN
INTRAMUSCULAR | Status: AC
  Filled 2013-09-11: qty 2

## 2013-09-11 MED ORDER — FENTANYL CITRATE 0.05 MG/ML IJ SOLN
INTRAMUSCULAR | Status: AC
  Filled 2013-09-11: qty 2

## 2013-09-11 MED ORDER — LIDOCAINE HCL (PF) 1 % IJ SOLN
INTRAMUSCULAR | Status: AC
  Filled 2013-09-11: qty 30

## 2013-09-11 MED ORDER — POTASSIUM CHLORIDE CRYS ER 20 MEQ PO TBCR
40.0000 meq | EXTENDED_RELEASE_TABLET | Freq: Once | ORAL | Status: AC
  Administered 2013-09-11: 22:00:00 40 meq via ORAL
  Filled 2013-09-11: qty 2

## 2013-09-11 MED ORDER — TICAGRELOR 90 MG PO TABS
90.0000 mg | ORAL_TABLET | Freq: Two times a day (BID) | ORAL | Status: DC
  Administered 2013-09-12 – 2013-09-13 (×4): 90 mg via ORAL
  Filled 2013-09-11 (×6): qty 1

## 2013-09-11 MED ORDER — SODIUM CHLORIDE 0.9 % IV SOLN
0.2500 mg/kg/h | INTRAVENOUS | Status: AC
  Filled 2013-09-11 (×2): qty 250

## 2013-09-11 MED ORDER — HEPARIN SODIUM (PORCINE) 1000 UNIT/ML IJ SOLN
INTRAMUSCULAR | Status: AC
Start: 2013-09-11 — End: 2013-09-11
  Filled 2013-09-11: qty 1

## 2013-09-11 MED ORDER — ONDANSETRON HCL 4 MG/2ML IJ SOLN: 4.0000 mg | Freq: Four times a day (QID) | INTRAMUSCULAR | Status: DC | PRN

## 2013-09-11 MED ORDER — BIVALIRUDIN 250 MG IV SOLR
INTRAVENOUS | Status: AC
Start: 2013-09-11 — End: 2013-09-11
  Filled 2013-09-11: qty 250

## 2013-09-11 MED ORDER — HEPARIN BOLUS VIA INFUSION
2000.0000 [IU] | Freq: Once | INTRAVENOUS | Status: AC
  Administered 2013-09-11: 2000 [IU] via INTRAVENOUS
  Filled 2013-09-11: qty 2000

## 2013-09-11 MED ORDER — ASPIRIN 81 MG PO CHEW
81.0000 mg | CHEWABLE_TABLET | Freq: Every day | ORAL | Status: DC
  Administered 2013-09-12 – 2013-09-13 (×2): 81 mg via ORAL
  Filled 2013-09-11 (×2): qty 1

## 2013-09-11 MED ORDER — ASPIRIN EC 81 MG PO TBEC
81.0000 mg | DELAYED_RELEASE_TABLET | Freq: Every day | ORAL | Status: DC
  Filled 2013-09-11 (×2): qty 1

## 2013-09-11 MED ORDER — SODIUM CHLORIDE 0.9 % IV SOLN
INTRAVENOUS | Status: DC
  Administered 2013-09-11: 17:00:00 via INTRAVENOUS

## 2013-09-11 MED ORDER — TICAGRELOR 90 MG PO TABS
ORAL_TABLET | ORAL | Status: AC
  Filled 2013-09-11: qty 2

## 2013-09-11 MED ORDER — ACETAMINOPHEN 325 MG PO TABS: 650.0000 mg | ORAL_TABLET | ORAL | Status: DC | PRN

## 2013-09-11 MED ORDER — NITROGLYCERIN 0.2 MG/ML ON CALL CATH LAB
INTRAVENOUS | Status: AC
  Filled 2013-09-11: qty 1

## 2013-09-11 MED ORDER — TICAGRELOR 90 MG PO TABS
180.0000 mg | ORAL_TABLET | Freq: Once | ORAL | Status: DC
  Filled 2013-09-11: qty 2

## 2013-09-11 MED ORDER — VERAPAMIL HCL 2.5 MG/ML IV SOLN
INTRAVENOUS | Status: AC
  Filled 2013-09-11: qty 2

## 2013-09-11 MED ORDER — HEPARIN (PORCINE) IN NACL 2-0.9 UNIT/ML-% IJ SOLN
INTRAMUSCULAR | Status: AC
Start: 2013-09-11 — End: 2013-09-11
  Filled 2013-09-11: qty 1500

## 2013-09-11 NOTE — H&P (View-Only) (Signed)
Patient ID: TALBERT TREMBATH, male   DOB: 1940/03/29, 73 y.o.   MRN: [id number]   SUBJECTIVE: Patient diuresed quite well yesterday, breathing much improved.    Scheduled Meds: . aspirin  81 mg Oral Daily  . ezetimibe-simvastatin  1 tablet Oral QHS  . furosemide  40 mg Intravenous BID  . lisinopril  5 mg Oral BID  . sodium chloride  3 mL Intravenous Q12H  . tamsulosin  0.4 mg Oral Daily   Continuous Infusions: . heparin 900 Units/hr (09/10/13 2200)   PRN Meds:.sodium chloride, acetaminophen, ALPRAZolam, nitroGLYCERIN, ondansetron (ZOFRAN) IV, sodium chloride, zolpidem    Filed Vitals:   09/10/13 1500 09/10/13 2035 09/10/13 2138 09/11/13 0519  BP: 133/68 91/53 108/61 95/51  Pulse: 89 81  77  Temp: 98 F (36.7 C) 97.5 F (36.4 C)  98.5 F (36.9 C)  TempSrc: Oral Oral  Oral  Resp: 18 18 77 17  Height:      Weight:    133 lb (60.328 kg)  SpO2: 98% 93%  91%    Intake/Output Summary (Last 24 hours) at 09/11/13 0812 Last data filed at 09/11/13 0531  Gross per 24 hour  Intake  748.5 ml  Output   3475 ml  Net -2726.5 ml    LABS: Basic Metabolic Panel:  Recent Labs  09/10/13 0656 09/11/13 0102  NA 139 139  K 4.3 3.6*  CL 100 98  CO2 24 26  GLUCOSE 121* 108*  BUN 21 21  CREATININE 0.87 0.91  CALCIUM 8.8 8.7   Liver Function Tests:  Recent Labs  09/10/13 0209  AST 35  ALT 30  ALKPHOS 65  BILITOT 0.2*  PROT 6.5  ALBUMIN 3.3*   No results found for this basename: LIPASE, AMYLASE,  in the last 72 hours CBC:  Recent Labs  09/10/13 0209 09/11/13 0102  WBC 11.3* 8.5  NEUTROABS 9.2*  --   HGB 12.5* 12.5*  HCT 37.8* 37.6*  MCV 86.7 85.3  PLT 205 229   Cardiac Enzymes:  Recent Labs  09/10/13 0658 09/10/13 1115 09/10/13 1850  TROPONINI 0.79* 0.98* 0.83*   BNP: No components found with this basename: POCBNP,  D-Dimer:  Recent Labs  09/10/13 0235  DDIMER 2.32*   Hemoglobin A1C:  Recent Labs  09/10/13 0656  HGBA1C 6.1*   Fasting Lipid  Panel:  Recent Labs  09/10/13 0657  CHOL 116  HDL 48  LDLCALC 53  TRIG 73  CHOLHDL 2.4   Thyroid Function Tests: No results found for this basename: TSH, T4TOTAL, FREET3, T3FREE, THYROIDAB,  in the last 72 hours Anemia Panel: No results found for this basename: VITAMINB12, FOLATE, FERRITIN, TIBC, IRON, RETICCTPCT,  in the last 72 hours  RADIOLOGY: Ct Angio Chest Pe W/cm &/or Wo Cm  09/10/2013   CLINICAL DATA:  Shortness of breath  EXAM: CT ANGIOGRAPHY CHEST WITH CONTRAST  TECHNIQUE: Multidetector CT imaging of the chest was performed using the standard protocol during bolus administration of intravenous contrast. Multiplanar CT image reconstructions and MIPs were obtained to evaluate the vascular anatomy.  CONTRAST:  158mL OMNIPAQUE IOHEXOL 350 MG/ML SOLN  COMPARISON:  None.  FINDINGS: THORACIC INLET/BODY WALL:  No acute abnormality.  MEDIASTINUM:  Cardiomegaly. No pericardial effusion. Diffuse coronary artery atherosclerosis. There is limited opacification of the aorta ; no evidence of acute thoracic aortic pathology. There is no evidence of pulmonary embolism. No lymphadenopathy.  LUNG WINDOWS:  Interlobular septal thickening and fissural thickening, especially at the bases. There is mild dependent  ground-glass density. Diffuse bronchial cuffing. Small bilateral pleural effusions which are layering/ simple. 5 mm right upper lobe pulmonary nodule on image 11.  UPPER ABDOMEN:  Partly imaged abdominal aortic aneurysm, beginning just below the level of the kidneys. Where seen, the aneurysm measures at least 3.7 cm in AP dimension.  OSSEOUS:  No acute fracture.  No suspicious lytic or blastic lesions.  Review of the MIP images confirms the above findings.  IMPRESSION: 1. Mild CHF. 2. Negative for pulmonary embolism. 3. 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 Recommend nonemergent abdominal aortic sonography. 4. 5 mm right upper lobe pulmonary nodule. See recommendations  below.  RECOMMENDATIONS: Given risk factors for bronchogenic carcinoma, follow-up chest CT at 6 - 12 months is recommended. This recommendation follows the consensus statement: Guidelines for Management of Small Pulmonary Nodules Detected on CT Scans: A Statement from the [employer] as published in Radiology 2005;237:395-400.   Electronically Signed   By: Jorje Guild M.D.   On: 09/10/2013 05:29   Dg Chest Port 1 View  09/10/2013   CLINICAL DATA:  Shortness of breath and cough.  EXAM: PORTABLE CHEST - 1 VIEW  COMPARISON:  Chest radiograph May 27, 2008  FINDINGS: The cardiac silhouette is upper limits of normal in size, accentuated by AP technique. Mediastinal silhouette is nonsuspicious. Strandy densities in lung bases with slight blunting of the costophrenic angles. No focal consolidation. No pneumothorax. Soft tissue planes and included osseous structures are nonsuspicious; Multiple EKG lines overlie the patient and may obscure subtle underlying pathology.  IMPRESSION: Borderline cardiomegaly, bibasilar strandy densities may reflect atelectasis with suspected small pleural effusions versus pleural thickening   Electronically Signed   By: Elon Alas   On: 09/10/2013 03:40    PHYSICAL EXAM General: NAD Neck: JVP 8 cm, no thyromegaly or thyroid nodule.  Lungs: Clear to auscultation bilaterally with normal respiratory effort. CV: Nondisplaced PMI.  Heart regular S1/S2, no S3/S4, no murmur.  No peripheral edema.  No carotid bruit.  Normal pedal pulses.  Abdomen: Soft, nontender, no hepatosplenomegaly, no distention.  Neurologic: Alert and oriented x 3.  Psych: Normal affect. Extremities: No clubbing or cyanosis.   TELEMETRY: Reviewed telemetry pt in NSR  ASSESSMENT AND PLAN: 73 yo with history of smoking and COPD presented with acute systolic CHF.  1. Acute systolic CHF: Suspect ischemic cardiomyopathy, EF 20-25% with wall motion abnormalities.  Patient has had exertional dyspnea  x several months, worse x several weeks, developed severe orthopnea (has had some orthopnea for weeks) and decided to come to ER because he could not lie down in bed. Symptomatically improved with IV Lasix. TnI elevated mildly but no significant trend. BNP very high, pulmonary edema on CTA, volume overloaded on exam.  - One more dose IV Lasix, then probably convert to po.  - Continue lisinopril.  - Will need to start beta blocker (prefer bisoprolol with COPD) eventually.  2. CAD: Elevated troponin, mildly with no trend. Suspect this is demand ischemia from acute CHF. No chest pain. I am suspicious, however, for ischemic cardiomyopathy given his known vascular disease wall motion abnormalities on echo, and active smoking. Given symptoms for months with gradual onset, suspect more chronic CAD, perhaps old out of hospital MI (ECG with LBBB, no prior).  - Plan for LHC today.   - Continue ASA, statin.  Check lipids.  3. COPD: Active smoker. Plans to stop. 4. H/o bilateral CEAs.   [name] 09/11/2013 8:18 AM

## 2013-09-11 NOTE — Progress Notes (Signed)
Patient reports SOB.  Saturation and lung sounds wnl.  SOB maybe secondary to Brilinta.  Giving caffeine.

## 2013-09-11 NOTE — Progress Notes (Signed)
Patient ID: Erik [name], male   DOB: 01-Sep-1940, 73 y.o.   MRN: [id number]   SUBJECTIVE: Patient diuresed quite well yesterday, breathing much improved.    Scheduled Meds: . aspirin  81 mg Oral Daily  . ezetimibe-simvastatin  1 tablet Oral QHS  . furosemide  40 mg Intravenous BID  . lisinopril  5 mg Oral BID  . sodium chloride  3 mL Intravenous Q12H  . tamsulosin  0.4 mg Oral Daily   Continuous Infusions: . heparin 900 Units/hr (09/10/13 2200)   PRN Meds:.sodium chloride, acetaminophen, ALPRAZolam, nitroGLYCERIN, ondansetron (ZOFRAN) IV, sodium chloride, zolpidem    Filed Vitals:   09/10/13 1500 09/10/13 2035 09/10/13 2138 09/11/13 0519  BP: 133/68 91/53 108/61 95/51  Pulse: 89 81  77  Temp: 98 F (36.7 C) 97.5 F (36.4 C)  98.5 F (36.9 C)  TempSrc: Oral Oral  Oral  Resp: 18 18 77 17  Height:      Weight:    133 lb (60.328 kg)  SpO2: 98% 93%  91%    Intake/Output Summary (Last 24 hours) at 09/11/13 0812 Last data filed at 09/11/13 0531  Gross per 24 hour  Intake  748.5 ml  Output   3475 ml  Net -2726.5 ml    LABS: Basic Metabolic Panel:  Recent Labs  09/10/13 0656 09/11/13 0102  NA 139 139  K 4.3 3.6*  CL 100 98  CO2 24 26  GLUCOSE 121* 108*  BUN 21 21  CREATININE 0.87 0.91  CALCIUM 8.8 8.7   Liver Function Tests:  Recent Labs  09/10/13 0209  AST 35  ALT 30  ALKPHOS 65  BILITOT 0.2*  PROT 6.5  ALBUMIN 3.3*   No results found for this basename: LIPASE, AMYLASE,  in the last 72 hours CBC:  Recent Labs  09/10/13 0209 09/11/13 0102  WBC 11.3* 8.5  NEUTROABS 9.2*  --   HGB 12.5* 12.5*  HCT 37.8* 37.6*  MCV 86.7 85.3  PLT 205 229   Cardiac Enzymes:  Recent Labs  09/10/13 0658 09/10/13 1115 09/10/13 1850  TROPONINI 0.79* 0.98* 0.83*   BNP: No components found with this basename: POCBNP,  D-Dimer:  Recent Labs  09/10/13 0235  DDIMER 2.32*   Hemoglobin A1C:  Recent Labs  09/10/13 0656  HGBA1C 6.1*   Fasting Lipid  Panel:  Recent Labs  09/10/13 0657  CHOL 116  HDL 48  LDLCALC 53  TRIG 73  CHOLHDL 2.4   Thyroid Function Tests: No results found for this basename: TSH, T4TOTAL, FREET3, T3FREE, THYROIDAB,  in the last 72 hours Anemia Panel: No results found for this basename: VITAMINB12, FOLATE, FERRITIN, TIBC, IRON, RETICCTPCT,  in the last 72 hours  RADIOLOGY: Ct Angio Chest Pe W/cm &/or Wo Cm  09/10/2013   CLINICAL DATA:  Shortness of breath  EXAM: CT ANGIOGRAPHY CHEST WITH CONTRAST  TECHNIQUE: Multidetector CT imaging of the chest was performed using the standard protocol during bolus administration of intravenous contrast. Multiplanar CT image reconstructions and MIPs were obtained to evaluate the vascular anatomy.  CONTRAST:  130mL OMNIPAQUE IOHEXOL 350 MG/ML SOLN  COMPARISON:  None.  FINDINGS: THORACIC INLET/BODY WALL:  No acute abnormality.  MEDIASTINUM:  Cardiomegaly. No pericardial effusion. Diffuse coronary artery atherosclerosis. There is limited opacification of the aorta ; no evidence of acute thoracic aortic pathology. There is no evidence of pulmonary embolism. No lymphadenopathy.  LUNG WINDOWS:  Interlobular septal thickening and fissural thickening, especially at the bases. There is mild dependent  ground-glass density. Diffuse bronchial cuffing. Small bilateral pleural effusions which are layering/ simple. 5 mm right upper lobe pulmonary nodule on image 11.  UPPER ABDOMEN:  Partly imaged abdominal aortic aneurysm, beginning just below the level of the kidneys. Where seen, the aneurysm measures at least 3.7 cm in AP dimension.  OSSEOUS:  No acute fracture.  No suspicious lytic or blastic lesions.  Review of the MIP images confirms the above findings.  IMPRESSION: 1. Mild CHF. 2. Negative for pulmonary embolism. 3. 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 Recommend nonemergent abdominal aortic sonography. 4. 5 mm right upper lobe pulmonary nodule. See recommendations  below.  RECOMMENDATIONS: Given risk factors for bronchogenic carcinoma, follow-up chest CT at 6 - 12 months is recommended. This recommendation follows the consensus statement: Guidelines for Management of Small Pulmonary Nodules Detected on CT Scans: A Statement from the [place] as published in Radiology 2005;237:395-400.   Electronically Signed   By: Jorje Guild M.D.   On: 09/10/2013 05:29   Dg Chest Port 1 View  09/10/2013   CLINICAL DATA:  Shortness of breath and cough.  EXAM: PORTABLE CHEST - 1 VIEW  COMPARISON:  Chest radiograph May 27, 2008  FINDINGS: The cardiac silhouette is upper limits of normal in size, accentuated by AP technique. Mediastinal silhouette is nonsuspicious. Strandy densities in lung bases with slight blunting of the costophrenic angles. No focal consolidation. No pneumothorax. Soft tissue planes and included osseous structures are nonsuspicious; Multiple EKG lines overlie the patient and may obscure subtle underlying pathology.  IMPRESSION: Borderline cardiomegaly, bibasilar strandy densities may reflect atelectasis with suspected small pleural effusions versus pleural thickening   Electronically Signed   By: Elon Alas   On: 09/10/2013 03:40    PHYSICAL EXAM General: NAD Neck: JVP 8 cm, no thyromegaly or thyroid nodule.  Lungs: Clear to auscultation bilaterally with normal respiratory effort. CV: Nondisplaced PMI.  Heart regular S1/S2, no S3/S4, no murmur.  No peripheral edema.  No carotid bruit.  Normal pedal pulses.  Abdomen: Soft, nontender, no hepatosplenomegaly, no distention.  Neurologic: Alert and oriented x 3.  Psych: Normal affect. Extremities: No clubbing or cyanosis.   TELEMETRY: Reviewed telemetry pt in NSR  ASSESSMENT AND PLAN: 73 yo with history of smoking and COPD presented with acute systolic CHF.  1. Acute systolic CHF: Suspect ischemic cardiomyopathy, EF 20-25% with wall motion abnormalities.  Patient has had exertional dyspnea  x several months, worse x several weeks, developed severe orthopnea (has had some orthopnea for weeks) and decided to come to ER because he could not lie down in bed. Symptomatically improved with IV Lasix. TnI elevated mildly but no significant trend. BNP very high, pulmonary edema on CTA, volume overloaded on exam.  - One more dose IV Lasix, then probably convert to po.  - Continue lisinopril.  - Will need to start beta blocker (prefer bisoprolol with COPD) eventually.  2. CAD: Elevated troponin, mildly with no trend. Suspect this is demand ischemia from acute CHF. No chest pain. I am suspicious, however, for ischemic cardiomyopathy given his known vascular disease wall motion abnormalities on echo, and active smoking. Given symptoms for months with gradual onset, suspect more chronic CAD, perhaps old out of hospital MI (ECG with LBBB, no prior).  - Plan for LHC today.   - Continue ASA, statin.  Check lipids.  3. COPD: Active smoker. Plans to stop. 4. H/o bilateral CEAs.   [name] 09/11/2013 8:18 AM

## 2013-09-11 NOTE — Progress Notes (Signed)
[facility] for Heparin  Indication: chest pain/ACS  Allergies  Allergen Reactions  . Codeine Swelling    REACTION: Throat swells    Patient Measurements: Height: 5\' 7"  (170.2 cm) Weight: 140 lb 14 oz (63.9 kg) IBW/kg (Calculated) : 66.1  Vital Signs: Temp: 97.5 F (36.4 C) (06/29 2035) Temp src: Oral (06/29 2035) BP: 108/61 mmHg (06/29 2138) Pulse Rate: 81 (06/29 2035)  Labs:  Recent Labs  09/10/13 0209 09/10/13 0656 09/10/13 0658 09/10/13 1115 09/10/13 1850 09/11/13 0102  HGB 12.5*  --   --   --   --  12.5*  HCT 37.8*  --   --   --   --  37.6*  PLT 205  --   --   --   --  229  LABPROT  --   --   --   --   --  14.3  INR  --   --   --   --   --  1.11  HEPARINUNFRC  --   --   --   --   --  0.14*  CREATININE 0.91 0.87  --   --   --  0.91  TROPONINI 0.62*  --  0.79* 0.98* 0.83*  --     Estimated Creatinine Clearance: 66.3 ml/min (by C-G formula based on Cr of 0.91).  Assessment: 73 yo male with CHF/ACS for heparin  Goal of Therapy:  Heparin level 0.3-0.7 units/ml Monitor platelets by anticoagulation protocol: Yes   Plan:  Heparin 2000 units IV bolus, then increase heparin 1100 units/hr F/U after cath  Caryl Pina 09/11/2013,2:02 AM

## 2013-09-11 NOTE — Clinical Documentation Improvement (Signed)
Presents with SOB, Acute Systolic CHF, elevated Troponins, new LBBB; for Cath today.   Troponins have ranged from 0.62 to 0.98   Suspect demand ischemia from Acute CHF documented in today's Progress Note.  Please clarify if you feel NSTEMI ruled in or out on this case and document findings in next Progress Note and Discharge Summary to reflect an accurate Severity of Illness and Risk of Mortality in the care you are providing this patient.  Thank You, Zoila Shutter ,RN Clinical Documentation Specialist:  [place] [employer]

## 2013-09-11 NOTE — Interval H&P Note (Signed)
Cath Lab Visit (complete for each Cath Lab visit)  Clinical Evaluation Leading to the Procedure:   ACS: Yes.    Non-ACS:    Anginal Classification: CCS IV  Anti-ischemic medical therapy: No Therapy  Non-Invasive Test Results: No non-invasive testing performed  Prior CABG: No previous CABG      History and Physical Interval Note:  09/11/2013 1:02 PM  Erik [name]  has presented today for surgery, with the diagnosis of cp  The various methods of treatment have been discussed with the patient and family. After consideration of risks, benefits and other options for treatment, the patient has consented to  Procedure(s): LEFT HEART CATHETERIZATION WITH CORONARY ANGIOGRAM (N/A) as a surgical intervention .  The patient's history has been reviewed, patient examined, no change in status, stable for surgery.  I have reviewed the patient's chart and labs.  Questions were answered to the patient's satisfaction.     Tenleigh Byer [employer]

## 2013-09-11 NOTE — CV Procedure (Signed)
Erik [name] is a 73 y.o. male    [id number]  [id number] LOCATION:  FACILITY: [facility]  PHYSICIAN: Troy Sine, MD, [facility] 11/28/1940   DATE OF PROCEDURE:  09/11/2013    PERCUTANEOUS CORONARY INTERVENTION    HISTORY:    Erik [name] is a 73 y.o. male   PROCEDURE: Two-vessel percutaneous coronary intervention performed via the right radial artery with PTCA/stenting of the proximal LAD and proximal RCA.  The patient had undergone diagnostic catheterization via the right radial artery approach by Dr. [name]  who found severe two-vessel coronary artery disease with subtotal calcified proximal LAD stenosis and 95% proximal RCA stenosis.  I was asked to perform two-vessel percutaneous coronary intervention.  The patient received Brilinta 180 mg.  He was started on Angiomax bolus and infusion.  He received an additional 100 mcg of intracoronary nitroglycerin in his right radial artery sheath which was in place from the diagnostic procedure.  Attention was first directed at the LAD.  A 6 French XB LAD 3.5 guide was used.  It was very difficult to cross the very calcified subtotal LAD stenosis on an upward bend in the vessel and initially a prowater wire was inserted and even with a 2.0x12 mm Euphora balloon, the wire was not able to cross the subtotal stenosis.  Ultimately, a whisper wire was successful in crossing the stenosis.  It was advanced down the distal LAD.  The 2.0x12 mm balloon was inserted and inflations at 8, 9, and 12 atm were done.  Since was a long smooth stenosis proximal to the subtotal stenosis, a Xience Alpine 3.0x33 mm DES stent was inserted and was able to cover the entire proximal region of stenoses.  This was dilated at 14 and 15 atmospheres.  A 3.25x20 [place] Euphora balloon was used to post-stent dilatation up to 17 atmospheres corresponding to 3.3 mm. With the  demonstration of excellent angiographic response to the LAD, diffuse and calcified stenoses, attention was  then directed at the RCA.  A 6 [place] JR 4 guide was used for the intervention.  The same whisper wire was advanced down the distal RCA.  Predilatation was done with a 2.5x12 mm Euphora balloon at 12 and 14 atmospheres.  A Xience Alpine 3.5 x 15 mm balloon was inserted and dilated at 12 atmospheres x2.  A 3.75x12 mm [place] Euphora balloon was used to post-stent dilatation at 12, 14, and 14 atmospheres.  Angiography confirmed an excellent angiographic result.  The catheters were removed from the patient.  A TR band was applied at 16:00  With 10cc of air.  He left the catheterization suite chest pain free with stable hemodynamics.   HEMODYNAMICS:   Central Aorta: 44/50    ANGIOGRAPHY:  LAD: At the start of the intervention, the LAD had diffuse calcification with smooth 50% stenosis.  The vessel then had a sharp upward angle where there was a 99% stenosis.  Following successful intervention with ultimate insertion of a 3.0x33 mm Xience Alpine stent, postdilated to 3.3 mm the entire proximal LAD was reduced to 0%.  There was brisk TIMI-3 flow.  There was no evidence for dissection.   RCA:  The RCA artery had 95% proximal stenosis and then was followed by an ectatic segment.  There is 30% narrowing beyond the ectatic segment.  The distal RCA was large caliber and dominant.  Following successful PTCA and stenting with a 3.5x15 mm Xience Alpine stent, postdilated to 3.75 mm, 95% stenosis was reduced to 0%.  There was brisk TIMI-3 flow.  There is no evidence for dissection   IMPRESSION:  Successful 2 vessel coronary intervention to the diffusely diseased proximal LAD with insertion of a 3.0x33 mm Xience stent, postdilated to 3.3 mm, and a 3.5x15 mm Xience stent in the RCA post dilated to 3.75 mm.  RECOMMENDATION:  The patient will be maintained on Angiomax at reduced dose for 2 hours post procedure.  He was previously found to have an ejection fraction of 20-25%, which most likely was ischemia mediated due  to his high-grade proximal LAD and RCA stenoses.  Hopefully, LV function will improve.  He will need to continue dual antiplatelet therapy for minimal and of a year or longer.  Troy Sine, MD, [facility] 09/11/2013 7:34 PM

## 2013-09-11 NOTE — CV Procedure (Signed)
    Cardiac Catheterization Procedure Note  Name: Erik [name] MRN: [id number] DOB: October 20, 1940  Procedure: Selective Coronary Angiography  Indication: Acute systolic CHF, NSTEMI   Procedural Details: The right wrist was prepped, draped, and anesthetized with 1% lidocaine. Using the modified Seldinger technique, a 5 French sheath was introduced into the right radial artery. 3 mg of verapamil was administered through the sheath, weight-based unfractionated heparin was administered intravenously. Standard Judkins catheters were used for selective coronary angiography. Catheter exchanges were performed over an exchange length guidewire. There were no immediate procedural complications. The patient will have PCI  Procedural Findings: Hemodynamics: AO 99/48  Coronary angiography: Coronary dominance: right  Left mainstem: Short left main with 30% distal tapering.   Left anterior descending (LAD): Small D1 and D2. 70% ostial D1 stenosis. 95-99% proximal LAD stenosis just proximal to D2.   Left circumflex (LCx): Small to moderate ramus, subtotally occluded.  40% ostial stenosis of a large OM1.  40% AV LCx after OM1.   Right coronary artery (RCA): 90% proximal RCA stenosis.   Left ventriculography: Not done, echo done yesterday.    Final Conclusions:  Severe 2 vessel disease.  Plan PCI today.   [name] 09/11/2013, 1:42 PM

## 2013-09-12 DIAGNOSIS — J449 Chronic obstructive pulmonary disease, unspecified: Secondary | ICD-10-CM | POA: Diagnosis not present

## 2013-09-12 DIAGNOSIS — R0609 Other forms of dyspnea: Secondary | ICD-10-CM | POA: Diagnosis not present

## 2013-09-12 DIAGNOSIS — I509 Heart failure, unspecified: Secondary | ICD-10-CM | POA: Diagnosis not present

## 2013-09-12 DIAGNOSIS — I214 Non-ST elevation (NSTEMI) myocardial infarction: Secondary | ICD-10-CM

## 2013-09-12 DIAGNOSIS — I5021 Acute systolic (congestive) heart failure: Secondary | ICD-10-CM | POA: Diagnosis not present

## 2013-09-12 DIAGNOSIS — F17200 Nicotine dependence, unspecified, uncomplicated: Secondary | ICD-10-CM

## 2013-09-12 DIAGNOSIS — I248 Other forms of acute ischemic heart disease: Secondary | ICD-10-CM | POA: Diagnosis not present

## 2013-09-12 HISTORY — PX: OTHER SURGICAL HISTORY: SHX169

## 2013-09-12 LAB — BASIC METABOLIC PANEL
BUN: 17 mg/dL (ref 6–23)
CHLORIDE: 102 meq/L (ref 96–112)
CO2: 24 mEq/L (ref 19–32)
CREATININE: 0.82 mg/dL (ref 0.50–1.35)
Calcium: 8.6 mg/dL (ref 8.4–10.5)
GFR calc non Af Amer: 86 mL/min — ABNORMAL LOW (ref >90)
Glucose, Bld: 101 mg/dL — ABNORMAL HIGH (ref 70–99)
Potassium: 4.3 mEq/L (ref 3.7–5.3)
SODIUM: 139 meq/L (ref 137–147)

## 2013-09-12 LAB — LIPID PANEL
Cholesterol: 111 mg/dL (ref 0–200)
HDL: 48 mg/dL (ref >39)
LDL Cholesterol: 47 mg/dL (ref 0–99)
Total CHOL/HDL Ratio: 2.3 RATIO
Triglycerides: 79 mg/dL (ref <150)
VLDL: 16 mg/dL (ref 0–40)

## 2013-09-12 LAB — CBC
HCT: 37.2 % — ABNORMAL LOW (ref 39.0–52.0)
Hemoglobin: 12.3 g/dL — ABNORMAL LOW (ref 13.0–17.0)
MCH: 28.5 pg (ref 26.0–34.0)
MCHC: 33.1 g/dL (ref 30.0–36.0)
MCV: 86.3 fL (ref 78.0–100.0)
PLATELETS: 223 10*3/uL (ref 150–400)
RBC: 4.31 MIL/uL (ref 4.22–5.81)
RDW: 14.9 % (ref 11.5–15.5)
WBC: 7.7 10*3/uL (ref 4.0–10.5)

## 2013-09-12 MED ORDER — FUROSEMIDE 20 MG PO TABS
20.0000 mg | ORAL_TABLET | Freq: Every day | ORAL | Status: DC
  Administered 2013-09-12 – 2013-09-13 (×2): 20 mg via ORAL
  Filled 2013-09-12 (×2): qty 1

## 2013-09-12 MED ORDER — BISOPROLOL FUMARATE 5 MG PO TABS
2.5000 mg | ORAL_TABLET | Freq: Every day | ORAL | Status: DC
  Administered 2013-09-12: 2.5 mg via ORAL
  Filled 2013-09-12 (×2): qty 0.5

## 2013-09-12 MED ORDER — CARVEDILOL 3.125 MG PO TABS: 3.1250 mg | ORAL_TABLET | Freq: Two times a day (BID) | ORAL | Status: DC

## 2013-09-12 MED FILL — Sodium Chloride IV Soln 0.9%: INTRAVENOUS | Qty: 50 | Status: AC

## 2013-09-12 NOTE — Care Management Note (Addendum)
  Page 2 of 2   09/13/2013     12:22:24 PM CARE MANAGEMENT NOTE 09/13/2013  Patient:  YOVANNI, FRENETTE   Account Number:  [id number]  Date Initiated:  09/12/2013  Documentation initiated by:  Blaize Epple  Subjective/Objective Assessment:   Respiratiory distress and chest pain     Action/Plan:   CM to follow for dispositon needs   Anticipated DC Date:  09/13/2013   Anticipated DC Plan:  [employer]  CM consult      Choice offered to / List presented to:     DME arranged  OTHER - SEE COMMENT           Status of service:  Completed, signed off Medicare Important Message given?   (If response is "NO", the following Medicare IM given date fields will be blank) Date Medicare IM given:  09/13/2013 Medicare IM given by:  Leeandra Ellerson Date Additional Medicare IM given:   Additional Medicare IM given by:    Discharge Disposition:  HOME/SELF CARE  Per UR Regulation:  Reviewed for med. necessity/level of care/duration of stay  If discussed at [place] of Stay Meetings, dates discussed:    Comments:  Jase Himmelberger RN, BSN, MSHL, CCM  Nurse - Case Manager, (Unit [phone]  09/12/2013 Benefits check Brilinta 90mg  bid coverage, co-pays, deductibles, authorizations and preferred pharm PER CMA UPdate:---09/12/2013 1011 by NIA SHEALY---PT COPAY WIL BE $136.67 -NO PRIOR [place] Application provided to patient for med asssitance. Copy faxed to [employer]  [phone] Copy mailed to patient d/t did not get copy from chart at time of d/c d/t MD signature in progress. [place] [address]   Life Vest order [place] information faxed to [facility]:  Face sheet, H&P, progress note, echo report. Nadean Corwin [facility] Life Vest Representative  notified and will notify Claiborne Billings for [employer]

## 2013-09-12 NOTE — Discharge Instructions (Signed)

## 2013-09-12 NOTE — Discharge Summary (Signed)
Advanced Heart Failure Team  Discharge Summary   Patient ID: Erik [name] MRN: [id number], DOB/AGE: November 02, 1940 73 y.o. Admit date: 09/10/2013 D/C date:     09/13/2013   Primary Discharge Diagnoses:  1. A/C Systolic Heart Failure- 7/56/43 ECHO 20-25% 2. ICM 3. CAD- LHC - 2 vessel disease that required PTCA stending of proximal LAD and proximal RCA. Dual antiplatelet therapy for minimum of one year. Brillinta 90 mg twice a day and Aspirin 81 mg daily 4. COPD 5. H/O of bilateral CEAs   Hospital Course:   Erik [name] is a 73 year old with a history of COPD, HTN , PAD with right carotid endarterectomy, current smoker admitted with increased SOB. On admit troponin was 0.6 and Pro BNP was 3101. He was not having chest pain and the elevated troponin was thought to be from demand ischemia related to acute heart failure. On exam he had volume overload and he was started on IV lasix. An ECHO was performed and revealed EF 20-25%.  As his HF was optimized he was taken to cath lab that showed 2 vessel disease that required PTCA stentding of proximal LAD and proximal RCA. He will continue on birllinta and aspirin for a minimum of 1 year. He was discharged on bisoprolol 5 mg daily and lisinopril 5 mg twice a day. Overall he was diuresed 5 pounds.   Cardiac Rehab will contact him for an appointment to start outpatient rehab. He will be discharged with Life Vest and plan to repeat ECHo in 3 months after HF meds optimized. He will continue to be followed closely in the HF clinic with an appointment July 8th at 11:00. Plan to check BMET at that time.    LHC 09/11/13 Left mainstem: Short left main with 30% distal tapering.  Left anterior descending (LAD): Small D1 and D2. 70% ostial D1 stenosis. 95-99% proximal LAD stenosis just proximal to D2.  Left circumflex (LCx): Small to moderate ramus, subtotally occluded. 40% ostial stenosis of a large OM1. 40% AV LCx after OM1.  Right coronary artery (RCA): 90% proximal  RCA stenosis.  Left ventriculography: Not done, echo done yesterday.     Discharge Weight Range: 134 pounds.  Discharge Vitals: Blood pressure 151/40, pulse 73, temperature 98 F (36.7 C), temperature source Oral, resp. rate 18, height 5\' 7"  (1.702 m), weight 135 lb 12.9 oz (61.6 kg), SpO2 99.00%.  Labs: Lab Results  Component Value Date   WBC 7.7 09/12/2013   HGB 12.3* 09/12/2013   HCT 37.2* 09/12/2013   MCV 86.3 09/12/2013   PLT 223 09/12/2013    Recent Labs Lab 09/10/13 0209  09/13/13 0330  NA 138  < > 138  K 4.0  < > 4.1  CL 101  < > 102  CO2 21  < > 24  BUN 24*  < > 20  CREATININE 0.91  < > 0.95  CALCIUM 8.7  < > 8.9  PROT 6.5  --   --   BILITOT 0.2*  --   --   ALKPHOS 65  --   --   ALT 30  --   --   AST 35  --   --   GLUCOSE 167*  < > 101*  < > = values in this interval not displayed. Lab Results  Component Value Date   CHOL 111 09/12/2013   HDL 48 09/12/2013   LDLCALC 47 09/12/2013   TRIG 79 09/12/2013   BNP (last 3 results)  Recent Labs  09/10/13 0209  PROBNP 3101.0*    Diagnostic Studies/Procedures   No results found.  Discharge Medications     Medication List    STOP taking these medications       ALEVE PO      TAKE these medications       aspirin 81 MG tablet  Take 81 mg by mouth daily.     bisoprolol 5 MG tablet  Commonly known as:  ZEBETA  Take 1 tablet (5 mg total) by mouth daily.     ezetimibe-simvastatin 10-40 MG per tablet  Commonly known as:  VYTORIN  Take 1 tablet by mouth at bedtime.     fish oil-omega-3 fatty acids 1000 MG capsule  Take 1 g by mouth daily.     furosemide 20 MG tablet  Commonly known as:  LASIX  Take 1 tablet (20 mg total) by mouth daily.     ibuprofen 200 MG tablet  Commonly known as:  ADVIL,MOTRIN  Take 200 mg by mouth as needed for pain.     lisinopril 5 MG tablet  Commonly known as:  PRINIVIL,ZESTRIL  Take 1 tablet (5 mg total) by mouth 2 (two) times daily.     nitroGLYCERIN 0.4 MG SL tablet  Commonly  known as:  NITROSTAT  Place 1 tablet (0.4 mg total) under the tongue every 5 (five) minutes x 3 doses as needed for chest pain.     tamsulosin 0.4 MG Caps capsule  Commonly known as:  FLOMAX  Take 0.4 mg by mouth daily.     ticagrelor 90 MG Tabs tablet  Commonly known as:  BRILINTA  Take 1 tablet (90 mg total) by mouth 2 (two) times daily.        Disposition   The patient will be discharged in stable condition to home. Discharge Instructions   ACE Inhibitor / ARB already ordered    Complete by:  As directed      Amb Referral to Cardiac Rehabilitation    Complete by:  As directed      Diet - low sodium heart healthy    Complete by:  As directed      Heart Failure patients record your daily weight using the same scale at the same time of day    Complete by:  As directed      Increase activity slowly    Complete by:  As directed           Follow-up Information   Follow up with Glori Bickers, MD On 09/19/2013. (at 11:00 . The [facility] is located on the 1st floor of [facility])    Specialty:  Cardiology   Contact information:   [place] [address] [phone]       Follow up with [facility]. (They will contact you for an appoointment)    Specialty:  Cardiac Rehabilitation   Contact information:   [address] [id number] [place] [address] [phone]        Duration of Discharge Encounter: Greater than 35 minutes   Signed, Morganna Styles NP-C  09/13/2013, 11:55 AM

## 2013-09-12 NOTE — Progress Notes (Signed)
Patient ID: Erik [name], male   DOB: 1940-09-07, 73 y.o.   MRN: [id number]   SUBJECTIVE: Doing well this morning, dyspnea resolved, no chest pain.     Scheduled Meds: . aspirin  81 mg Oral Daily  . carvedilol  3.125 mg Oral BID WC  . ezetimibe-simvastatin  1 tablet Oral QHS  . furosemide  20 mg Oral Daily  . lisinopril  5 mg Oral BID  . tamsulosin  0.4 mg Oral Daily  . ticagrelor  180 mg Oral Once  . ticagrelor  90 mg Oral BID   Continuous Infusions:   PRN Meds:.acetaminophen, acetaminophen, ALPRAZolam, nitroGLYCERIN, ondansetron (ZOFRAN) IV, ondansetron (ZOFRAN) IV, zolpidem    Filed Vitals:   09/11/13 2000 09/11/13 2210 09/12/13 0017 09/12/13 0613  BP: 118/50 118/53 108/50 104/50  Pulse: 84  75 62  Temp: 97.9 F (36.6 C)  99.1 F (37.3 C) 98.8 F (37.1 C)  TempSrc: Oral  Oral Oral  Resp: 15  17 20   Height:      Weight:   134 lb 7.7 oz (61 kg)   SpO2: 94%  93% 98%    Intake/Output Summary (Last 24 hours) at 09/12/13 0732 Last data filed at 09/12/13 0612  Gross per 24 hour  Intake  337.5 ml  Output   1425 ml  Net -1087.5 ml    LABS: Basic Metabolic Panel:  Recent Labs  09/11/13 0102 09/12/13 0500  NA 139 139  K 3.6* 4.3  CL 98 102  CO2 26 24  GLUCOSE 108* 101*  BUN 21 17  CREATININE 0.91 0.82  CALCIUM 8.7 8.6   Liver Function Tests:  Recent Labs  09/10/13 0209  AST 35  ALT 30  ALKPHOS 65  BILITOT 0.2*  PROT 6.5  ALBUMIN 3.3*   No results found for this basename: LIPASE, AMYLASE,  in the last 72 hours CBC:  Recent Labs  09/10/13 0209 09/11/13 0102 09/12/13 0500  WBC 11.3* 8.5 7.7  NEUTROABS 9.2*  --   --   HGB 12.5* 12.5* 12.3*  HCT 37.8* 37.6* 37.2*  MCV 86.7 85.3 86.3  PLT 205 229 223   Cardiac Enzymes:  Recent Labs  09/10/13 0658 09/10/13 1115 09/10/13 1850  TROPONINI 0.79* 0.98* 0.83*   BNP: No components found with this basename: POCBNP,  D-Dimer:  Recent Labs  09/10/13 0235  DDIMER 2.32*   Hemoglobin  A1C:  Recent Labs  09/10/13 0656  HGBA1C 6.1*   Fasting Lipid Panel:  Recent Labs  09/12/13 0500  CHOL 111  HDL 48  LDLCALC 47  TRIG 79  CHOLHDL 2.3   Thyroid Function Tests: No results found for this basename: TSH, T4TOTAL, FREET3, T3FREE, THYROIDAB,  in the last 72 hours Anemia Panel: No results found for this basename: VITAMINB12, FOLATE, FERRITIN, TIBC, IRON, RETICCTPCT,  in the last 72 hours  RADIOLOGY: Ct Angio Chest Pe W/cm &/or Wo Cm  09/10/2013   CLINICAL DATA:  Shortness of breath  EXAM: CT ANGIOGRAPHY CHEST WITH CONTRAST  TECHNIQUE: Multidetector CT imaging of the chest was performed using the standard protocol during bolus administration of intravenous contrast. Multiplanar CT image reconstructions and MIPs were obtained to evaluate the vascular anatomy.  CONTRAST:  159mL OMNIPAQUE IOHEXOL 350 MG/ML SOLN  COMPARISON:  None.  FINDINGS: THORACIC INLET/BODY WALL:  No acute abnormality.  MEDIASTINUM:  Cardiomegaly. No pericardial effusion. Diffuse coronary artery atherosclerosis. There is limited opacification of the aorta ; no evidence of acute thoracic aortic pathology. There is no  evidence of pulmonary embolism. No lymphadenopathy.  LUNG WINDOWS:  Interlobular septal thickening and fissural thickening, especially at the bases. There is mild dependent ground-glass density. Diffuse bronchial cuffing. Small bilateral pleural effusions which are layering/ simple. 5 mm right upper lobe pulmonary nodule on image 11.  UPPER ABDOMEN:  Partly imaged abdominal aortic aneurysm, beginning just below the level of the kidneys. Where seen, the aneurysm measures at least 3.7 cm in AP dimension.  OSSEOUS:  No acute fracture.  No suspicious lytic or blastic lesions.  Review of the MIP images confirms the above findings.  IMPRESSION: 1. Mild CHF. 2. Negative for pulmonary embolism. 3. 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 Recommend nonemergent abdominal aortic  sonography. 4. 5 mm right upper lobe pulmonary nodule. See recommendations below.  RECOMMENDATIONS: Given risk factors for bronchogenic carcinoma, follow-up chest CT at 6 - 12 months is recommended. This recommendation follows the consensus statement: Guidelines for Management of Small Pulmonary Nodules Detected on CT Scans: A Statement from the [employer] as published in Radiology 2005;237:395-400.   Electronically Signed   By: Jorje Guild M.D.   On: 09/10/2013 05:29   Dg Chest Port 1 View  09/10/2013   CLINICAL DATA:  Shortness of breath and cough.  EXAM: PORTABLE CHEST - 1 VIEW  COMPARISON:  Chest radiograph May 27, 2008  FINDINGS: The cardiac silhouette is upper limits of normal in size, accentuated by AP technique. Mediastinal silhouette is nonsuspicious. Strandy densities in lung bases with slight blunting of the costophrenic angles. No focal consolidation. No pneumothorax. Soft tissue planes and included osseous structures are nonsuspicious; Multiple EKG lines overlie the patient and may obscure subtle underlying pathology.  IMPRESSION: Borderline cardiomegaly, bibasilar strandy densities may reflect atelectasis with suspected small pleural effusions versus pleural thickening   Electronically Signed   By: Elon Alas   On: 09/10/2013 03:40    PHYSICAL EXAM General: NAD Neck: JVP 7 cm, no thyromegaly or thyroid nodule.  Lungs: Clear to auscultation bilaterally with normal respiratory effort. CV: Nondisplaced PMI.  Heart regular S1/S2, no S3/S4, no murmur.  No peripheral edema.  No carotid bruit.  Normal pedal pulses.  Abdomen: Soft, nontender, no hepatosplenomegaly, no distention.  Neurologic: Alert and oriented x 3.  Psych: Normal affect. Extremities: No clubbing or cyanosis. Right radial cath site benign.   TELEMETRY: Reviewed telemetry pt in NSR  ASSESSMENT AND PLAN: 73 yo with history of smoking and COPD presented with acute systolic CHF.  1. Acute systolic CHF:  Suspect ischemic cardiomyopathy, EF 20-25% with wall motion abnormalities.  Patient has had exertional dyspnea x several months, worse x several weeks, developed severe orthopnea (has had some orthopnea for weeks) and decided to come to ER because he could not lie down in bed. Symptomatically improved with IV Lasix. TnI elevated mildly but no significant trend. BNP very high, pulmonary edema on CTA.  Today, looks near-euvolemic.  - Lasix 20 mg po daily.   - Continue lisinopril.  - Add bisoprolol 2.5 mg daily (beta-1 selective with COPD).  - Echo in 3 months for recovery of EF.  Will arrange for Lifevest at discharge.  2. CAD: Elevated troponin, mildly with no trend. Given symptoms for months with gradual onset, suspect more chronic CAD, perhaps old out of hospital MI (ECG with LBBB, no prior).  LHC yesterday showed 90% RCA, 99% proximal LAD now s/p DES to both locations.  - He is on ticagrelor.  - Continue ASA, statin.    -  Cardiac rehab.  3. COPD: Active smoker. Plans to stop. 4. H/o bilateral CEAs.   [name] 09/12/2013 7:32 AM

## 2013-09-12 NOTE — Progress Notes (Signed)
TR BAND REMOVAL  LOCATION:    right radial  DEFLATED PER PROTOCOL:    Yes.    TIME BAND OFF / DRESSING APPLIED:    22:00   SITE UPON ARRIVAL:    Level 0  SITE AFTER BAND REMOVAL:    Level 0  REVERSE ALLEN'S TEST:     positive  CIRCULATION SENSATION AND MOVEMENT:    Within Normal Limits   Yes.    COMMENTS:   Pt tolerated removal of TR band without complication, will continue to monitor patient.

## 2013-09-12 NOTE — Plan of Care (Signed)
Problem: Consults Goal: Heart Failure Patient Education (See Patient Education module for education specifics.)  Reviewed CHF packet with patient and family, discussed low sodium diet, smoking cessation, S/S of CHF, daily weights, when to call MD, right radial restrictions.  Both voice understanding, deny questions.

## 2013-09-12 NOTE — Progress Notes (Signed)
CARDIAC REHAB PHASE I   PRE:  Rate/Rhythm: 79 SR BBB  BP:  Supine:   Sitting: 123/47  Standing:    SaO2: 97 RA  MODE:  Ambulation: 1000 ft   POST:  Rate/Rhythm: 90 SR BBB  BP:  Supine:   Sitting: 120/52  Standing:    SaO2: 97 RA [id number] Pt tolerated ambulation well without c/o of pain or SOB. Pt states that his breathing is much better. RA sat during walk 97%.Completed Stent and CHF education with pt and wife. He voices understanding. Pt agrees to [employer]. CRP in [place], will send referral. I discussed smoking cessation with pt. He was very quite and did not address if he wanted to or was interested in quitting. I gave him tips for quitting, quit smart class information and coaching contact number. Pt seems over whelmed with all of the information. Pt's wife is very supportive. We discussed important's of wearing the life vest.  Rodney Langton RN 09/12/2013 10:02 AM

## 2013-09-13 DIAGNOSIS — I214 Non-ST elevation (NSTEMI) myocardial infarction: Secondary | ICD-10-CM | POA: Diagnosis not present

## 2013-09-13 DIAGNOSIS — I509 Heart failure, unspecified: Secondary | ICD-10-CM | POA: Diagnosis not present

## 2013-09-13 DIAGNOSIS — I5021 Acute systolic (congestive) heart failure: Secondary | ICD-10-CM | POA: Diagnosis not present

## 2013-09-13 LAB — BASIC METABOLIC PANEL
Anion gap: 12 (ref 5–15)
BUN: 20 mg/dL (ref 6–23)
CHLORIDE: 102 meq/L (ref 96–112)
CO2: 24 mEq/L (ref 19–32)
Calcium: 8.9 mg/dL (ref 8.4–10.5)
Creatinine, Ser: 0.95 mg/dL (ref 0.50–1.35)
GFR calc Af Amer: >90 mL/min (ref >90)
GFR calc non Af Amer: 81 mL/min — ABNORMAL LOW (ref >90)
Glucose, Bld: 101 mg/dL — ABNORMAL HIGH (ref 70–99)
POTASSIUM: 4.1 meq/L (ref 3.7–5.3)
Sodium: 138 mEq/L (ref 137–147)

## 2013-09-13 MED ORDER — BISOPROLOL FUMARATE 5 MG PO TABS: 5.0000 mg | ORAL_TABLET | Freq: Every day | ORAL | Status: DC

## 2013-09-13 MED ORDER — NITROGLYCERIN 0.4 MG SL SUBL: 0.4000 mg | SUBLINGUAL_TABLET | SUBLINGUAL | Status: DC | PRN

## 2013-09-13 MED ORDER — LISINOPRIL 5 MG PO TABS: 5.0000 mg | ORAL_TABLET | Freq: Two times a day (BID) | ORAL | Status: DC

## 2013-09-13 MED ORDER — TICAGRELOR 90 MG PO TABS: 90.0000 mg | ORAL_TABLET | Freq: Two times a day (BID) | ORAL | Status: DC

## 2013-09-13 MED ORDER — BISOPROLOL FUMARATE 5 MG PO TABS
5.0000 mg | ORAL_TABLET | Freq: Every day | ORAL | Status: DC
  Administered 2013-09-13: 09:00:00 5 mg via ORAL
  Filled 2013-09-13: qty 1

## 2013-09-13 MED ORDER — FUROSEMIDE 20 MG PO TABS: 20.0000 mg | ORAL_TABLET | Freq: Every day | ORAL | Status: DC

## 2013-09-13 NOTE — Progress Notes (Signed)
[id number] Cardiac Rehab On arrival pt eating breakfast. He denies any problems this am. Answered pt's and wife's questions. Discussed with pt's nurse to get him to ambulate after he finishes with breakfast. Deon Pilling, RN 09/13/2013 9:09 AM

## 2013-09-13 NOTE — Progress Notes (Addendum)
Patient ID: Erik [name], male   DOB: [birth year], 73 y.o.   MRN: [id number]   SUBJECTIVE: Doing well this morning, dyspnea resolved, no chest pain.     Scheduled Meds: . aspirin  81 mg Oral Daily  . bisoprolol  2.5 mg Oral Daily  . ezetimibe-simvastatin  1 tablet Oral QHS  . furosemide  20 mg Oral Daily  . lisinopril  5 mg Oral BID  . tamsulosin  0.4 mg Oral Daily  . ticagrelor  180 mg Oral Once  . ticagrelor  90 mg Oral BID   Continuous Infusions:   PRN Meds:.acetaminophen, acetaminophen, ALPRAZolam, nitroGLYCERIN, ondansetron (ZOFRAN) IV, ondansetron (ZOFRAN) IV, zolpidem    Filed Vitals:   09/12/13 2058 09/12/13 2357 09/13/13 0002 09/13/13 0546  BP: 123/53 120/49  118/53  Pulse: 64 67  64  Temp: 98.2 F (36.8 C) 98.9 F (37.2 C)  98.8 F (37.1 C)  TempSrc: Oral Oral  Oral  Resp: 15 17  18   Height:      Weight:   135 lb 12.9 oz (61.6 kg)   SpO2: 97% 97%  96%    Intake/Output Summary (Last 24 hours) at 09/13/13 0659 Last data filed at 09/12/13 2300  Gross per 24 hour  Intake    580 ml  Output    750 ml  Net   -170 ml    LABS: Basic Metabolic Panel:  Recent Labs  09/12/13 0500 09/13/13 0330  NA 139 138  K 4.3 4.1  CL 102 102  CO2 24 24  GLUCOSE 101* 101*  BUN 17 20  CREATININE 0.82 0.95  CALCIUM 8.6 8.9   Liver Function Tests: No results found for this basename: AST, ALT, ALKPHOS, BILITOT, PROT, ALBUMIN,  in the last 72 hours No results found for this basename: LIPASE, AMYLASE,  in the last 72 hours CBC:  Recent Labs  09/11/13 0102 09/12/13 0500  WBC 8.5 7.7  HGB 12.5* 12.3*  HCT 37.6* 37.2*  MCV 85.3 86.3  PLT 229 223   Cardiac Enzymes:  Recent Labs  09/10/13 1115 09/10/13 1850  TROPONINI 0.98* 0.83*   BNP: No components found with this basename: POCBNP,  D-Dimer: No results found for this basename: DDIMER,  in the last 72 hours Hemoglobin A1C: No results found for this basename: HGBA1C,  in the last 72 hours Fasting Lipid  Panel:  Recent Labs  09/12/13 0500  CHOL 111  HDL 48  LDLCALC 47  TRIG 79  CHOLHDL 2.3   Thyroid Function Tests: No results found for this basename: TSH, T4TOTAL, FREET3, T3FREE, THYROIDAB,  in the last 72 hours Anemia Panel: No results found for this basename: VITAMINB12, FOLATE, FERRITIN, TIBC, IRON, RETICCTPCT,  in the last 72 hours  RADIOLOGY: Ct Angio Chest Pe W/cm &/or Wo Cm  09/10/2013   CLINICAL DATA:  Shortness of breath  EXAM: CT ANGIOGRAPHY CHEST WITH CONTRAST  TECHNIQUE: Multidetector CT imaging of the chest was performed using the standard protocol during bolus administration of intravenous contrast. Multiplanar CT image reconstructions and MIPs were obtained to evaluate the vascular anatomy.  CONTRAST:  144mL OMNIPAQUE IOHEXOL 350 MG/ML SOLN  COMPARISON:  None.  FINDINGS: THORACIC INLET/BODY WALL:  No acute abnormality.  MEDIASTINUM:  Cardiomegaly. No pericardial effusion. Diffuse coronary artery atherosclerosis. There is limited opacification of the aorta ; no evidence of acute thoracic aortic pathology. There is no evidence of pulmonary embolism. No lymphadenopathy.  LUNG WINDOWS:  Interlobular septal thickening and fissural thickening, especially at the  bases. There is mild dependent ground-glass density. Diffuse bronchial cuffing. Small bilateral pleural effusions which are layering/ simple. 5 mm right upper lobe pulmonary nodule on image 11.  UPPER ABDOMEN:  Partly imaged abdominal aortic aneurysm, beginning just below the level of the kidneys. Where seen, the aneurysm measures at least 3.7 cm in AP dimension.  OSSEOUS:  No acute fracture.  No suspicious lytic or blastic lesions.  Review of the MIP images confirms the above findings.  IMPRESSION: 1. Mild CHF. 2. Negative for pulmonary embolism. 3. 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 Recommend nonemergent abdominal aortic sonography. 4. 5 mm right upper lobe pulmonary nodule. See recommendations  below.  RECOMMENDATIONS: Given risk factors for bronchogenic carcinoma, follow-up chest CT at 6 - 12 months is recommended. This recommendation follows the consensus statement: Guidelines for Management of Small Pulmonary Nodules Detected on CT Scans: A Statement from the [employer] as published in Radiology 2005;237:395-400.   Electronically Signed   By: Jorje Guild M.D.   On: 09/10/2013 05:29   Dg Chest Port 1 View  09/10/2013   CLINICAL DATA:  Shortness of breath and cough.  EXAM: PORTABLE CHEST - 1 VIEW  COMPARISON:  Chest radiograph May 27, 2008  FINDINGS: The cardiac silhouette is upper limits of normal in size, accentuated by AP technique. Mediastinal silhouette is nonsuspicious. Strandy densities in lung bases with slight blunting of the costophrenic angles. No focal consolidation. No pneumothorax. Soft tissue planes and included osseous structures are nonsuspicious; Multiple EKG lines overlie the patient and may obscure subtle underlying pathology.  IMPRESSION: Borderline cardiomegaly, bibasilar strandy densities may reflect atelectasis with suspected small pleural effusions versus pleural thickening   Electronically Signed   By: Elon Alas   On: 09/10/2013 03:40    PHYSICAL EXAM General: NAD Neck: JVP 7 cm, no thyromegaly or thyroid nodule.  Lungs: Clear to auscultation bilaterally with normal respiratory effort. CV: Nondisplaced PMI.  Heart regular S1/S2, no S3/S4, no murmur.  No peripheral edema.  No carotid bruit.  Normal pedal pulses.  Abdomen: Soft, nontender, no hepatosplenomegaly, no distention.  Neurologic: Alert and oriented x 3.  Psych: Normal affect. Extremities: No clubbing or cyanosis. Right radial cath site benign.   TELEMETRY: Reviewed telemetry pt in NSR  ASSESSMENT AND PLAN: 73 yo with history of smoking and COPD presented with acute systolic CHF.  1. Acute systolic CHF: Suspect ischemic cardiomyopathy, EF 20-25% with wall motion abnormalities.   Patient has had exertional dyspnea x several months, worse x several weeks, developed severe orthopnea (has had some orthopnea for weeks) and decided to come to ER because he could not lie down in bed. Symptomatically improved with IV Lasix. TnI elevated mildly but no significant trend. BNP very high, pulmonary edema on CTA.  Today, looks near-euvolemic.  - Lasix 20 mg po daily.   - Continue lisinopril.  - Increase bisoprolol to 5 mg daily.  - Add spironolactone at followup. - Echo in 3 months for recovery of EF.  Will arrange for Lifevest at discharge.  2. CAD: Elevated troponin, mildly with no trend. Given symptoms for months with gradual onset, suspect more chronic CAD, perhaps old out of hospital MI (ECG with LBBB, no prior).  LHC yesterday showed 90% RCA, 99% proximal LAD now s/p DES to both locations.  - He is on ticagrelor.  - Continue ASA, statin.    - Cardiac rehab.  3. COPD: Active smoker. Plans to stop. 4. H/o bilateral CEAs.  5. Disposition: Home today.  Will need followup in 1 week in CHF clinic.  Will need cardiac rehab.  Meds: ASA 81, ticagrelor 90 bid, lisinopril 5 bid, bisoprolol 5 daily, Lasix 20 daily, Vytorin as prior to admit. Will go home with Lifevest.  [name] 09/13/2013 6:59 AM

## 2013-09-16 LAB — CULTURE, BLOOD (ROUTINE X 2)
CULTURE: NO GROWTH
Culture: NO GROWTH

## 2013-09-18 NOTE — Progress Notes (Signed)
Patient ID: Erik [name], male   DOB: 01-09-1941, 73 y.o.   MRN: [id number] PCP: Dr Josefa Half Vascular: Dr Scot Dock Cardiology: Dr Aundra Dubin     HPI: Erik [name] is a 73 year old with a history of COPD, HTN , PAD with bilateral CEAs, former smoker  Quit 08/2013. He was admitted to [facility] with increased dyspnea. On admit troponin was 0.6 and Pro BNP was 3101. He was not having chest pain and the elevated troponin was thought to be from demand ischemia related to acute heart failure. An ECHO was performed on 09/10/13 and revealed EF 20-25%. As his HF was optimized he was taken to cath lab that showed 2 vessel disease that required PTCA stentding of proximal LAD and proximal RCA. He will continue on birllinta and aspirin for a minimum of 1 year. He was discharged on bisoprolol 5 mg daily, lisinopril 5 mg twice a day, and lasix 20 mg daily.  Overall he was diuresed 5 pounds to  135 pounds.   He returns for post hospital follow up. Denies SOB/PND/Orthopnea. Albe to walk up 5-6 steps without difficulty. Wearing lifevest. No alarms. Weight at home 133 pounds. Taking all medications. Waiting on cardiac rehab to start. Quit smoking 2 weeks agon. Appetite fair    ECHO EF 20-25% LV severely dilate, mild LVH, RV normal   Labs 09/13/13 K 4.1 Creatinine 0.95   SH: Retires from [employer] 1998. Former smoker. Lives with his wife. Has 2 grown daughters FH:  Brother MI       ROS: All systems negative except as listed in HPI, PMH and Problem List.  Past Medical History  Diagnosis Date  . Carotid artery occlusion   . Hyperlipidemia   . COPD (chronic obstructive pulmonary disease)   . BPH (benign prostatic hypertrophy)   . History of colonic polyps   . Hypertension   . Heart murmur   . Collagen vascular disease     Current Outpatient Prescriptions  Medication Sig Dispense Refill  . aspirin 81 MG tablet Take 81 mg by mouth daily.        . bisoprolol (ZEBETA) 5 MG tablet Take 1 tablet (5 mg total) by mouth  daily.  30 tablet  6  . ezetimibe-simvastatin (VYTORIN) 10-40 MG per tablet Take 1 tablet by mouth at bedtime.      . fish oil-omega-3 fatty acids 1000 MG capsule Take 1 g by mouth daily.        . furosemide (LASIX) 20 MG tablet Take 1 tablet (20 mg total) by mouth daily.  30 tablet  6  . ibuprofen (ADVIL,MOTRIN) 200 MG tablet Take 200 mg by mouth as needed for pain.      Marland Kitchen lisinopril (PRINIVIL,ZESTRIL) 5 MG tablet Take 1 tablet (5 mg total) by mouth 2 (two) times daily.  60 tablet  6  . tamsulosin (FLOMAX) 0.4 MG CAPS capsule Take 0.4 mg by mouth daily.      . ticagrelor (BRILINTA) 90 MG TABS tablet Take 1 tablet (90 mg total) by mouth 2 (two) times daily.  60 tablet  6  . nitroGLYCERIN (NITROSTAT) 0.4 MG SL tablet Place 1 tablet (0.4 mg total) under the tongue every 5 (five) minutes x 3 doses as needed for chest pain.  30 tablet  12   No current facility-administered medications for this encounter.     PHYSICAL EXAM: Filed Vitals:   09/19/13 1115  BP: 120/58  Pulse: 60  Weight: 136 lb (61.689 kg)  SpO2:  99%    General:  Well appearing. No resp difficulty Wife present  HEENT: normal Neck: supple. JVP flat. Carotids 2+ bilaterally; no bruits. No lymphadenopathy or thryomegaly appreciated. R and L neck scars  Cor: PMI normal. Regular rate & rhythm. No rubs, gallops or murmurs. Lungs: clear Abdomen: soft, nontender, nondistended. No hepatosplenomegaly. No bruits or masses. Good bowel sounds. Extremities: no cyanosis, clubbing, rash, edema Neuro: alert & orientedx3, cranial nerves grossly intact. Moves all 4 extremities w/o difficulty. Affect pleasant.      ASSESSMENT & PLAN: 1. A/C Systolic Heart Failure- ICM , 09/10/13 ECHO 20-25%  NYHA II. Volume status stable. Continue Lasix 20 mg daily  Continue bisoprolol 5 mg daily will not up titrate as his HR is 60  Continue lisinopril 5 mg in am and increase bed time dose to 10 mg - check BMET next week.  Continue lifevest until HF meds  have been optimized. Plan to repeat ECHO the end of September. QRS 154 LBBB. Reinforced daily weights, low salt food choices, and limiting fluid intake to < 2 liters per day.  2. CAD- LHC 09/11/13 - 2 vessel disease that required PTCA stending of proximal LAD and proximal RCA. Dual antiplatelet therapy for minimum of one year. Brillinta 90 mg twice a day and Aspirin 81 mg daily . No evidence of ischemia . Outpatient Cardiac Rehab pending.  4. COPD -  Quit smoking 2 weeks ago.  5. H/O of bilateral CEAs - Per Dr Scot Dock   6. Former Smoker- quit smoking 2 weeks ago  Follow up in 2 weeks for additional HF med titration.   Erik Belknap NP-C  12:40 PM

## 2013-09-19 ENCOUNTER — Ambulatory Visit (HOSPITAL_COMMUNITY)
Admit: 2013-09-19 | Discharge: 2013-09-19 | Disposition: A | Discharge status: Home / Self Care | Payer: Medicare Other | Source: Ambulatory Visit | Attending: Internal Medicine | Admitting: Internal Medicine

## 2013-09-19 VITALS — BP 120/58 | HR 60 | Wt 136.0 lb

## 2013-09-19 DIAGNOSIS — I2510 Atherosclerotic heart disease of native coronary artery without angina pectoris: Secondary | ICD-10-CM | POA: Insufficient documentation

## 2013-09-19 DIAGNOSIS — I739 Peripheral vascular disease, unspecified: Secondary | ICD-10-CM | POA: Insufficient documentation

## 2013-09-19 DIAGNOSIS — Z860100 Personal history of colon polyps, unspecified: Secondary | ICD-10-CM | POA: Diagnosis not present

## 2013-09-19 DIAGNOSIS — I509 Heart failure, unspecified: Secondary | ICD-10-CM | POA: Diagnosis not present

## 2013-09-19 DIAGNOSIS — J449 Chronic obstructive pulmonary disease, unspecified: Secondary | ICD-10-CM | POA: Diagnosis not present

## 2013-09-19 DIAGNOSIS — J4489 Other specified chronic obstructive pulmonary disease: Secondary | ICD-10-CM | POA: Insufficient documentation

## 2013-09-19 DIAGNOSIS — Z87891 Personal history of nicotine dependence: Secondary | ICD-10-CM | POA: Diagnosis not present

## 2013-09-19 DIAGNOSIS — I6529 Occlusion and stenosis of unspecified carotid artery: Secondary | ICD-10-CM | POA: Diagnosis not present

## 2013-09-19 DIAGNOSIS — N400 Enlarged prostate without lower urinary tract symptoms: Secondary | ICD-10-CM | POA: Insufficient documentation

## 2013-09-19 DIAGNOSIS — F17200 Nicotine dependence, unspecified, uncomplicated: Secondary | ICD-10-CM

## 2013-09-19 DIAGNOSIS — E785 Hyperlipidemia, unspecified: Secondary | ICD-10-CM | POA: Insufficient documentation

## 2013-09-19 DIAGNOSIS — I5023 Acute on chronic systolic (congestive) heart failure: Secondary | ICD-10-CM | POA: Diagnosis not present

## 2013-09-19 DIAGNOSIS — Z9861 Coronary angioplasty status: Secondary | ICD-10-CM

## 2013-09-19 DIAGNOSIS — I5022 Chronic systolic (congestive) heart failure: Secondary | ICD-10-CM

## 2013-09-19 MED ORDER — LISINOPRIL 5 MG PO TABS: ORAL_TABLET | ORAL | Status: DC

## 2013-09-19 NOTE — Patient Instructions (Signed)
Follow up in 2 weeks  Follow up next Wednesday lab work   Take lisinopril 5 mg in am (1 tablet) and 10 mg in pm (2 tablets)   Do the following things EVERYDAY: 1) Weigh yourself in the morning before breakfast. Write it down and keep it in a log. 2) Take your medicines as prescribed 3) Eat low salt foods-Limit salt (sodium) to 2000 mg per day.  4) Stay as active as you can everyday 5) Limit all fluids for the day to less than 2 liters

## 2013-09-20 ENCOUNTER — Encounter: Payer: Self-pay | Admitting: Cardiovascular Disease

## 2013-09-26 ENCOUNTER — Ambulatory Visit (HOSPITAL_COMMUNITY)
Admission: RE | Admit: 2013-09-26 | Discharge: 2013-09-26 | Disposition: A | Discharge status: Home / Self Care | Payer: Medicare Other | Source: Ambulatory Visit | Attending: Cardiology | Admitting: Cardiology

## 2013-09-26 DIAGNOSIS — J4489 Other specified chronic obstructive pulmonary disease: Secondary | ICD-10-CM | POA: Insufficient documentation

## 2013-09-26 DIAGNOSIS — I5022 Chronic systolic (congestive) heart failure: Secondary | ICD-10-CM

## 2013-09-26 DIAGNOSIS — I10 Essential (primary) hypertension: Secondary | ICD-10-CM | POA: Diagnosis not present

## 2013-09-26 DIAGNOSIS — I428 Other cardiomyopathies: Secondary | ICD-10-CM | POA: Insufficient documentation

## 2013-09-26 DIAGNOSIS — Z87891 Personal history of nicotine dependence: Secondary | ICD-10-CM | POA: Diagnosis not present

## 2013-09-26 DIAGNOSIS — Z7982 Long term (current) use of aspirin: Secondary | ICD-10-CM | POA: Insufficient documentation

## 2013-09-26 DIAGNOSIS — Z9861 Coronary angioplasty status: Secondary | ICD-10-CM | POA: Insufficient documentation

## 2013-09-26 DIAGNOSIS — I2510 Atherosclerotic heart disease of native coronary artery without angina pectoris: Secondary | ICD-10-CM | POA: Insufficient documentation

## 2013-09-26 DIAGNOSIS — J449 Chronic obstructive pulmonary disease, unspecified: Secondary | ICD-10-CM | POA: Insufficient documentation

## 2013-09-26 DIAGNOSIS — I5023 Acute on chronic systolic (congestive) heart failure: Secondary | ICD-10-CM | POA: Diagnosis not present

## 2013-09-26 DIAGNOSIS — Z7902 Long term (current) use of antithrombotics/antiplatelets: Secondary | ICD-10-CM | POA: Insufficient documentation

## 2013-09-26 DIAGNOSIS — I509 Heart failure, unspecified: Secondary | ICD-10-CM | POA: Insufficient documentation

## 2013-09-26 DIAGNOSIS — E785 Hyperlipidemia, unspecified: Secondary | ICD-10-CM | POA: Diagnosis not present

## 2013-09-26 LAB — BASIC METABOLIC PANEL
Anion gap: 14 (ref 5–15)
BUN: 20 mg/dL (ref 6–23)
CHLORIDE: 100 meq/L (ref 96–112)
CO2: 25 meq/L (ref 19–32)
Calcium: 9.1 mg/dL (ref 8.4–10.5)
Creatinine, Ser: 1.04 mg/dL (ref 0.50–1.35)
GFR calc Af Amer: 81 mL/min — ABNORMAL LOW (ref >90)
GFR calc non Af Amer: 70 mL/min — ABNORMAL LOW (ref >90)
Glucose, Bld: 104 mg/dL — ABNORMAL HIGH (ref 70–99)
POTASSIUM: 4.3 meq/L (ref 3.7–5.3)
Sodium: 139 mEq/L (ref 137–147)

## 2013-10-02 ENCOUNTER — Encounter (HOSPITAL_COMMUNITY): Payer: Self-pay

## 2013-10-02 NOTE — Progress Notes (Addendum)
Patient ID: Erik [name], male   DOB: 20-Dec-1940, 73 y.o.   MRN: [id number] PCP: Dr Josefa Half Vascular: Dr Scot Dock Cardiology: Dr Aundra Dubin   HPI: Mr Erik [name] is a 73 year old with a history of COPD, HTN , PAD with bilateral CEAs, ICM, CAD s/p PTCA stent of proximal LAD and proximal RCA (09/960), chronic systolic HF and prior tobacco abuse.   Follow up for Heart Failure: Last visit increased lisinopril to 5 mg in the am and 10 mg in the pm. Denies SOB, PND, Orthopnea or CP. Wearing LifeVest daily and no alarms. Has not started CR has not heard anything from them. Able to walk around the whole grocery store without stopping. Taking medications as prescribed. Following a low salt diet and drinking less than 2L a day.   ECHO EF 20-25% LV severely dilate, mild LVH, RV normal   Labs 09/13/13 K 4.1 Creatinine 0.95   ROS: All systems negative except as listed in HPI, PMH and Problem List.  Family Status  Relation Status Death Age  . Brother Alive   . Mother Alive     HTN  . Father Deceased     brain tumor  . Brother Alive     HLD, Heart disease  . Brother Alive     HLD, CAD  . Brother Alive     HLD, MS    History   Social History  . Marital Status: Married    Spouse Name: N/A    Number of Children: N/A  . Years of Education: N/A   Occupational History  . Not on file.   Social History Main Topics  . Smoking status: Current Every Day Smoker -- 1.50 packs/day for .5 years    Types: Cigarettes  . Smokeless tobacco: Former Systems developer    Quit date: 09/10/2013  . Alcohol Use: No  . Drug Use: No  . Sexual Activity: Not on file   Other Topics Concern  . Not on file   Social History Narrative   Lives with wife in [place]. Retired from the post office                   Past Medical History  Diagnosis Date  . Coronary artery disease     a. LHC (08/2013): Lmain: short 30% distal, LAD: sml D1 & D2, 70% ostial D1, 95-99% LAD stenosis prox D2 LCx: sml/mod ramus subtot. occluded,  40% ostial set off lg OM1, 40% AV LCx after OM1, RCA: 90% prox (DES to RCA and prox LAD)  . Hyperlipidemia   . COPD (chronic obstructive pulmonary disease)   . BPH (benign prostatic hypertrophy)   . History of colonic polyps   . Hypertension   . Heart murmur   . Collagen vascular disease   . Chronic systolic heart failure     a. EF 20-25%, mild LVH, mod HK, mid apicalanteroseptal myocardium, mild MR, LA mod dilated  . Ischemic cardiomyopathy     Current Outpatient Prescriptions  Medication Sig Dispense Refill  . aspirin 81 MG tablet Take 81 mg by mouth daily.        . bisoprolol (ZEBETA) 5 MG tablet Take 1 tablet (5 mg total) by mouth daily.  30 tablet  6  . ezetimibe-simvastatin (VYTORIN) 10-40 MG per tablet Take 1 tablet by mouth at bedtime.      . fish oil-omega-3 fatty acids 1000 MG capsule Take 1 g by mouth daily.        . furosemide (LASIX)  20 MG tablet Take 1 tablet (20 mg total) by mouth daily.  30 tablet  6  . ibuprofen (ADVIL,MOTRIN) 200 MG tablet Take 200 mg by mouth as needed for pain.      Marland Kitchen lisinopril (PRINIVIL,ZESTRIL) 5 MG tablet Take 5 mg in am and 10 mg in pm  240 tablet  6  . nitroGLYCERIN (NITROSTAT) 0.4 MG SL tablet Place 1 tablet (0.4 mg total) under the tongue every 5 (five) minutes x 3 doses as needed for chest pain.  30 tablet  12  . tamsulosin (FLOMAX) 0.4 MG CAPS capsule Take 0.4 mg by mouth daily.      . ticagrelor (BRILINTA) 90 MG TABS tablet Take 1 tablet (90 mg total) by mouth 2 (two) times daily.  180 tablet  6   No current facility-administered medications for this encounter.    Filed Vitals:   10/03/13 1440  BP: 133/61  Pulse: 65  Resp: 18  Weight: 133 lb 6 oz (60.499 kg)  SpO2: 96%     PHYSICAL EXAM: General:  Well appearing. No resp difficulty Wife present  HEENT: normal Neck: supple. JVP flat. Carotids 2+ bilaterally; no bruits. No lymphadenopathy or thryomegaly appreciated. R and L neck scars  Cor: PMI normal. Regular rate & rhythm. No  rubs, gallops or murmurs. Lungs: clear Abdomen: soft, nontender, nondistended. No hepatosplenomegaly. No bruits or masses. Good bowel sounds. Extremities: no cyanosis, clubbing, rash, edema Neuro: alert & orientedx3, cranial nerves grossly intact. Moves all 4 extremities w/o difficulty. Affect pleasant.   ASSESSMENT & PLAN:  1. Chronic Systolic Heart Failure: ICM, EF 20-25% (08/2013) - NYHA II symptoms and volume status stable. Will continue lasix 20 mg daily. Discussed the use of sliding scale diuretics. - Continue bisprolol 5 mg daily.  - Will stop lisinopril. He has not taken afternoon dose and last took this am. On Friday after 36 hours of wash out will start Entresto 49/51 mg BID. Explained to the patient to call if any dizziness or angioedema. Check BMET today and in 7-10 days. - Next visit if K+ and renal function stable can consider starting spironolactone.  - Will need repeat ECHO at the end of September to reassess EF. If continues to be less than 35% will refer to EP for consideration of CRT-D. He has LBBB with QRS 154 msec. Would prefer Medtronic for HF diagnostics. - Reinforced the need and importance of daily weights, a low sodium diet, and fluid restriction (less than 2 L a day). Instructed to call the HF clinic if weight increases more than 3 lbs overnight or 5 lbs in a week.  2. CAD- LHC 09/11/13 - 2 vessel disease that required PTCA stending of proximal LAD and proximal RCA. Dual antiplatelet therapy for minimum of one year (09/2014). Continue Brillinta 90 mg BID and Aspirin 81 mg daily. No s/s of ischemia. Will look into Cardiac rehab referral.  4. COPD -  Has quit smoking and congratulated patient on this success. Had normal spirometry 02/2013.   5. H/O of bilateral CEAs - Per Dr Scot Dock   6. HTN - Stable. As above will start Entresto 49/51 BID.  7. HLD: - Last lipids 09/2013 which were good. Continue Vytorin. Managed by PCP.   F/U 3 weeks Junie Bame B NP-C  2:55  PM

## 2013-10-03 ENCOUNTER — Ambulatory Visit (HOSPITAL_COMMUNITY)
Admission: RE | Admit: 2013-10-03 | Discharge: 2013-10-03 | Disposition: A | Discharge status: Home / Self Care | Payer: Medicare Other | Source: Ambulatory Visit | Attending: Anesthesiology | Admitting: Anesthesiology

## 2013-10-03 ENCOUNTER — Encounter (HOSPITAL_COMMUNITY): Payer: Self-pay

## 2013-10-03 VITALS — BP 133/61 | HR 65 | Resp 18 | Wt 133.4 lb

## 2013-10-03 DIAGNOSIS — Z8249 Family history of ischemic heart disease and other diseases of the circulatory system: Secondary | ICD-10-CM | POA: Insufficient documentation

## 2013-10-03 DIAGNOSIS — Z09 Encounter for follow-up examination after completed treatment for conditions other than malignant neoplasm: Secondary | ICD-10-CM | POA: Diagnosis not present

## 2013-10-03 DIAGNOSIS — I5022 Chronic systolic (congestive) heart failure: Secondary | ICD-10-CM | POA: Diagnosis not present

## 2013-10-03 DIAGNOSIS — J449 Chronic obstructive pulmonary disease, unspecified: Secondary | ICD-10-CM | POA: Diagnosis not present

## 2013-10-03 DIAGNOSIS — I6529 Occlusion and stenosis of unspecified carotid artery: Secondary | ICD-10-CM

## 2013-10-03 DIAGNOSIS — Z7982 Long term (current) use of aspirin: Secondary | ICD-10-CM | POA: Diagnosis not present

## 2013-10-03 DIAGNOSIS — E785 Hyperlipidemia, unspecified: Secondary | ICD-10-CM | POA: Diagnosis not present

## 2013-10-03 DIAGNOSIS — I2510 Atherosclerotic heart disease of native coronary artery without angina pectoris: Secondary | ICD-10-CM | POA: Diagnosis not present

## 2013-10-03 DIAGNOSIS — I2589 Other forms of chronic ischemic heart disease: Secondary | ICD-10-CM | POA: Diagnosis not present

## 2013-10-03 DIAGNOSIS — I10 Essential (primary) hypertension: Secondary | ICD-10-CM | POA: Insufficient documentation

## 2013-10-03 DIAGNOSIS — J4489 Other specified chronic obstructive pulmonary disease: Secondary | ICD-10-CM | POA: Insufficient documentation

## 2013-10-03 DIAGNOSIS — Z9861 Coronary angioplasty status: Secondary | ICD-10-CM | POA: Insufficient documentation

## 2013-10-03 DIAGNOSIS — F17200 Nicotine dependence, unspecified, uncomplicated: Secondary | ICD-10-CM | POA: Diagnosis not present

## 2013-10-03 DIAGNOSIS — Z79899 Other long term (current) drug therapy: Secondary | ICD-10-CM | POA: Insufficient documentation

## 2013-10-03 HISTORY — DX: Ischemic cardiomyopathy: I25.5

## 2013-10-03 HISTORY — DX: Chronic systolic (congestive) heart failure: I50.22

## 2013-10-03 HISTORY — DX: Occlusion and stenosis of unspecified carotid artery: I65.29

## 2013-10-03 HISTORY — DX: Atherosclerotic heart disease of native coronary artery without angina pectoris: I25.10

## 2013-10-03 LAB — BASIC METABOLIC PANEL
Anion gap: 12 (ref 5–15)
BUN: 21 mg/dL (ref 6–23)
CHLORIDE: 101 meq/L (ref 96–112)
CO2: 23 mEq/L (ref 19–32)
Calcium: 8.8 mg/dL (ref 8.4–10.5)
Creatinine, Ser: 0.98 mg/dL (ref 0.50–1.35)
GFR calc Af Amer: >90 mL/min (ref >90)
GFR calc non Af Amer: 80 mL/min — ABNORMAL LOW (ref >90)
GLUCOSE: 111 mg/dL — AB (ref 70–99)
Potassium: 4.6 mEq/L (ref 3.7–5.3)
Sodium: 136 mEq/L — ABNORMAL LOW (ref 137–147)

## 2013-10-03 MED ORDER — TICAGRELOR 90 MG PO TABS: 90.0000 mg | ORAL_TABLET | Freq: Two times a day (BID) | ORAL | Status: DC

## 2013-10-03 MED ORDER — LISINOPRIL 5 MG PO TABS: ORAL_TABLET | ORAL | Status: DC

## 2013-10-03 NOTE — Patient Instructions (Signed)
Stop Lisinopril today.  Start Entresto 49/51 (1 tablet) twice a day. Call if any dizziness.  Come by clinic next August 3rd for labs  Follow up in 3 weeks with doctor  Do the following things EVERYDAY: 1) Weigh yourself in the morning before breakfast. Write it down and keep it in a log. 2) Take your medicines as prescribed 3) Eat low salt foods-Limit salt (sodium) to 2000 mg per day.  4) Stay as active as you can everyday 5) Limit all fluids for the day to less than 2 liters 6)

## 2013-10-09 ENCOUNTER — Telehealth (HOSPITAL_COMMUNITY): Payer: Self-pay | Admitting: Vascular Surgery

## 2013-10-09 NOTE — Telephone Encounter (Signed)
Becky from [employer] called about prior auth for this pt..  [phone] ext [phone].Marland Kitchen Please advise

## 2013-10-10 ENCOUNTER — Telehealth (HOSPITAL_COMMUNITY): Payer: Self-pay | Admitting: Vascular Surgery

## 2013-10-10 MED ORDER — TICAGRELOR 90 MG PO TABS: 90.0000 mg | ORAL_TABLET | Freq: Two times a day (BID) | ORAL | Status: DC

## 2013-10-10 NOTE — Telephone Encounter (Signed)
As requested 90 day rx sent  To pharmacy

## 2013-10-10 NOTE — Telephone Encounter (Signed)
PT wants new 90 day prescription for  Brilinta 90 mg

## 2013-10-10 NOTE — Telephone Encounter (Signed)
Spoke with Erik [name] from [facility] questioned if we will require assistance with completing PA for meds Advised PA assistance would be greatly appreciated Requested information (office note, echo, and labs) faxed to [facility]

## 2013-10-11 ENCOUNTER — Telehealth (HOSPITAL_COMMUNITY): Payer: Self-pay | Admitting: *Deleted

## 2013-10-11 NOTE — Telephone Encounter (Signed)
Referral received signed by Dr. Aundra Dubin.  Unable to reach anyone or leave message on home phone number - [phone].  Called cell phone and message left to please contact cardiac rehab.  Contact information given. Cherre Huger, BSN

## 2013-10-15 ENCOUNTER — Ambulatory Visit (HOSPITAL_COMMUNITY)
Admission: RE | Admit: 2013-10-15 | Discharge: 2013-10-15 | Disposition: A | Discharge status: Home / Self Care | Payer: Medicare Other | Source: Ambulatory Visit | Attending: Internal Medicine | Admitting: Internal Medicine

## 2013-10-15 DIAGNOSIS — I5022 Chronic systolic (congestive) heart failure: Secondary | ICD-10-CM | POA: Diagnosis not present

## 2013-10-15 LAB — BASIC METABOLIC PANEL
Anion gap: 11 (ref 5–15)
BUN: 19 mg/dL (ref 6–23)
CHLORIDE: 102 meq/L (ref 96–112)
CO2: 27 mEq/L (ref 19–32)
Calcium: 8.8 mg/dL (ref 8.4–10.5)
Creatinine, Ser: 0.96 mg/dL (ref 0.50–1.35)
GFR calc Af Amer: >90 mL/min (ref >90)
GFR calc non Af Amer: 81 mL/min — ABNORMAL LOW (ref >90)
Glucose, Bld: 104 mg/dL — ABNORMAL HIGH (ref 70–99)
POTASSIUM: 4.1 meq/L (ref 3.7–5.3)
Sodium: 140 mEq/L (ref 137–147)

## 2013-10-25 ENCOUNTER — Ambulatory Visit (HOSPITAL_COMMUNITY)
Admission: RE | Admit: 2013-10-25 | Discharge: 2013-10-25 | Disposition: A | Discharge status: Home / Self Care | Payer: Medicare Other | Source: Ambulatory Visit | Attending: Internal Medicine | Admitting: Internal Medicine

## 2013-10-25 ENCOUNTER — Encounter (HOSPITAL_COMMUNITY): Payer: Self-pay

## 2013-10-25 VITALS — BP 98/36 | HR 62 | Wt 132.0 lb

## 2013-10-25 DIAGNOSIS — J449 Chronic obstructive pulmonary disease, unspecified: Secondary | ICD-10-CM | POA: Insufficient documentation

## 2013-10-25 DIAGNOSIS — Z9861 Coronary angioplasty status: Secondary | ICD-10-CM | POA: Diagnosis not present

## 2013-10-25 DIAGNOSIS — I5022 Chronic systolic (congestive) heart failure: Secondary | ICD-10-CM | POA: Insufficient documentation

## 2013-10-25 DIAGNOSIS — I739 Peripheral vascular disease, unspecified: Secondary | ICD-10-CM | POA: Diagnosis not present

## 2013-10-25 DIAGNOSIS — I6529 Occlusion and stenosis of unspecified carotid artery: Secondary | ICD-10-CM | POA: Insufficient documentation

## 2013-10-25 DIAGNOSIS — N400 Enlarged prostate without lower urinary tract symptoms: Secondary | ICD-10-CM | POA: Insufficient documentation

## 2013-10-25 DIAGNOSIS — I2589 Other forms of chronic ischemic heart disease: Secondary | ICD-10-CM | POA: Insufficient documentation

## 2013-10-25 DIAGNOSIS — Z87891 Personal history of nicotine dependence: Secondary | ICD-10-CM | POA: Insufficient documentation

## 2013-10-25 DIAGNOSIS — Z7982 Long term (current) use of aspirin: Secondary | ICD-10-CM | POA: Insufficient documentation

## 2013-10-25 DIAGNOSIS — I10 Essential (primary) hypertension: Secondary | ICD-10-CM | POA: Insufficient documentation

## 2013-10-25 DIAGNOSIS — I2510 Atherosclerotic heart disease of native coronary artery without angina pectoris: Secondary | ICD-10-CM | POA: Diagnosis not present

## 2013-10-25 DIAGNOSIS — E785 Hyperlipidemia, unspecified: Secondary | ICD-10-CM | POA: Insufficient documentation

## 2013-10-25 DIAGNOSIS — J4489 Other specified chronic obstructive pulmonary disease: Secondary | ICD-10-CM | POA: Insufficient documentation

## 2013-10-25 DIAGNOSIS — I509 Heart failure, unspecified: Secondary | ICD-10-CM | POA: Insufficient documentation

## 2013-10-25 LAB — BASIC METABOLIC PANEL
Anion gap: 13 (ref 5–15)
BUN: 24 mg/dL — AB (ref 6–23)
CHLORIDE: 99 meq/L (ref 96–112)
CO2: 26 mEq/L (ref 19–32)
CREATININE: 1.07 mg/dL (ref 0.50–1.35)
Calcium: 9 mg/dL (ref 8.4–10.5)
GFR calc Af Amer: 78 mL/min — ABNORMAL LOW (ref >90)
GFR, EST NON AFRICAN AMERICAN: 67 mL/min — AB (ref >90)
Glucose, Bld: 150 mg/dL — ABNORMAL HIGH (ref 70–99)
Potassium: 4.3 mEq/L (ref 3.7–5.3)
Sodium: 138 mEq/L (ref 137–147)

## 2013-10-25 MED ORDER — AMBULATORY NON FORMULARY MEDICATION
Status: DC
Start: 2013-10-25 — End: 2013-10-25

## 2013-10-25 MED ORDER — AMBULATORY NON FORMULARY MEDICATION: Status: DC

## 2013-10-25 NOTE — Progress Notes (Signed)
Patient ID: Erik [name], male   DOB: 03/21/1940, 73 y.o.   MRN: [id number] PCP: Dr Josefa Half Vascular: Dr Scot Dock Cardiology: Dr Aundra Dubin   HPI: Erik [name] is a 73 year old with a history of COPD, HTN , PAD with bilateral CEAs, ICM, CAD s/p PTCA stent of proximal LAD and proximal RCA (06/2593), chronic systolic HF and prior tobacco abuse.   Admitted with dyspnea in 08/2013. Found to have 2v CAD and EF 20-25% which was a new finding. Wearing LifeVest.   Follow up for Heart Failure: At last visit lisinopril switched to [facility].  Feels weak. BP low at home typically 90/50. No syncope. Denies SOB, PND, Orthopnea or CP. Wearing LifeVest daily and no alarms. Weight stable at home 129 pounds.  Able to walk around the whole grocery store without stopping. Taking medications as prescribed. Following a low salt diet and drinking less than 2L a day. Still has not heard from Cardiac Rehab.   ECHO EF 20-25% LV severely dilated, mild LVH, RV normal   Labs 09/13/13 K 4.1 Creatinine 0.95  Labs 10/15/13 K 4.1 Cr 0.7  ROS: All systems negative except as listed in HPI, PMH and Problem List.  Family Status  Relation Status Death Age  . Brother Alive   . Mother Alive     HTN  . Father Deceased     brain tumor  . Brother Alive     HLD, Heart disease  . Brother Alive     HLD, CAD  . Brother Alive     HLD, MS    History   Social History  . Marital Status: Married    Spouse Name: Erik [name]    Number of Children: Erik [name]  . Years of Education: Erik [name]   Occupational History  . Not on file.   Social History Main Topics  . Smoking status: Former Smoker -- 1.50 packs/day for .5 years    Types: Cigarettes  . Smokeless tobacco: Former Systems developer    Quit date: 09/10/2013  . Alcohol Use: No  . Drug Use: No  . Sexual Activity: Not on file   Other Topics Concern  . Not on file   Social History Narrative   Lives with wife in [place]. Retired from the post office                   Past Medical History   Diagnosis Date  . Coronary artery disease     a. LHC (08/2013): Lmain: short 30% distal, LAD: sml D1 & D2, 70% ostial D1, 95-99% LAD stenosis prox D2 LCx: sml/mod ramus subtot. occluded, 40% ostial set off lg OM1, 40% AV LCx after OM1, RCA: 90% prox (DES to RCA and prox LAD)  . Hyperlipidemia   . COPD (chronic obstructive pulmonary disease)   . BPH (benign prostatic hypertrophy)   . History of colonic polyps   . Hypertension   . Heart murmur   . Collagen vascular disease   . Chronic systolic heart failure     a. EF 20-25%, mild LVH, mod HK, mid apicalanteroseptal myocardium, mild Erik, LA mod dilated  . Ischemic cardiomyopathy   . Carotid artery stenosis     a. Bilateral CEA    Current Outpatient Prescriptions  Medication Sig Dispense Refill  . aspirin 81 MG tablet Take 81 mg by mouth daily.        . bisoprolol (ZEBETA) 5 MG tablet Take 1 tablet (5 mg total) by mouth daily.  30 tablet  6  .  ezetimibe-simvastatin (VYTORIN) 10-40 MG per tablet Take 1 tablet by mouth at bedtime.      . fish oil-omega-3 fatty acids 1000 MG capsule Take 1 g by mouth daily.        . furosemide (LASIX) 20 MG tablet Take 1 tablet (20 mg total) by mouth daily.  30 tablet  6  . ibuprofen (ADVIL,MOTRIN) 200 MG tablet Take 200 mg by mouth as needed for pain.      . nitroGLYCERIN (NITROSTAT) 0.4 MG SL tablet Place 1 tablet (0.4 mg total) under the tongue every 5 (five) minutes x 3 doses as needed for chest pain.  30 tablet  12  . tamsulosin (FLOMAX) 0.4 MG CAPS capsule Take 0.4 mg by mouth daily.      . ticagrelor (BRILINTA) 90 MG TABS tablet Take 1 tablet (90 mg total) by mouth 2 (two) times daily.  180 tablet  6  . AMBULATORY NON FORMULARY MEDICATION Medication Name: [name] 49/51mg  one po twice a day  30 tablet  6   No current facility-administered medications for this encounter.    Filed Vitals:   10/25/13 1430  BP: 98/36  Pulse: 62  Weight: 132 lb (59.875 kg)  SpO2: 96%     PHYSICAL EXAM: General:   Elderly. Thin No resp difficulty Wife present  HEENT: normal Neck: supple. JVP flat. Carotids 2+ bilaterally; no bruits. No lymphadenopathy or thryomegaly appreciated. R and L neck scars  Cor: PMI normal. Regular rate & rhythm. No rubs, gallops or murmurs. Lungs: clear with reduced BS throughout Abdomen: soft, nontender, nondistended. No hepatosplenomegaly. No bruits or masses. Good bowel sounds. Extremities: no cyanosis, clubbing, rash, edema Neuro: alert & orientedx3, cranial nerves grossly intact. Moves all 4 extremities w/o difficulty. Affect pleasant.   ASSESSMENT & PLAN:  1. Chronic Systolic Heart Failure: ICM, EF 20-25% (08/2013) - NYHA II-III symptoms and volume status stable. Will continue lasix 20 mg daily. Discussed the use of sliding scale diuretics. - Continue bisprolol 5 mg daily.  - BP soft. Will decrease Entresto to 24/26 mg BID - Check labs - Next visit if K+ and renal function stable can consider starting spironolactone.  - Will need repeat ECHO at next visit to reassess EF. If continues to be less than 35% will refer to EP for consideration of CRT-D. He has LBBB with QRS 154 msec. Would prefer [employer] for HF diagnostics. - Reinforced the need and importance of daily weights, a low sodium diet, and fluid restriction (less than 2 L a day). Instructed to call the HF clinic if weight increases more than 3 lbs overnight or 5 lbs in a week.  2. CAD- LHC 09/11/13 - 2 vessel disease that required PTCA stending of proximal LAD and proximal RCA. Dual antiplatelet therapy for minimum of one year (09/2014). Continue Brillinta 90 mg BID and Aspirin 81 mg daily. No s/s of ischemia. Will repeat Cardiac rehab referral.  4. COPD -  Has quit smoking and congratulated patient on this success. Had normal spirometry 02/2013.   5. H/O of bilateral CEAs - Per Dr Scot Dock   6. HLD: - Last lipids 09/2013 which were good. Continue Vytorin. Managed by PCP.   F/U 3 weeks with echo  Glori Bickers  MD  3:02 PM

## 2013-10-25 NOTE — Patient Instructions (Signed)
START Entresto 24/26mg  twice a day  Your physician has requested that you have an echocardiogram. Echocardiography is a painless test that uses sound waves to create images of your heart. It provides your doctor with information about the size and shape of your heart and how well your heart's chambers and valves are working. This procedure takes approximately one hour. There are no restrictions for this procedure.   Your physician recommends that you schedule a follow-up appointment in: 3 weeks with Echocardiogram  Do the following things EVERYDAY: 1) Weigh yourself in the morning before breakfast. Write it down and keep it in a log. 2) Take your medicines as prescribed 3) Eat low salt foods-Limit salt (sodium) to 2000 mg per day.  4) Stay as active as you can everyday 5) Limit all fluids for the day to less than 2 liters 6)

## 2013-11-15 ENCOUNTER — Encounter (HOSPITAL_COMMUNITY)
Admission: RE | Admit: 2013-11-15 | Discharge: 2013-11-15 | Disposition: A | Discharge status: Home / Self Care | Payer: Medicare Other | Source: Ambulatory Visit | Attending: Cardiology | Admitting: Cardiology

## 2013-11-15 DIAGNOSIS — I2510 Atherosclerotic heart disease of native coronary artery without angina pectoris: Secondary | ICD-10-CM | POA: Insufficient documentation

## 2013-11-15 DIAGNOSIS — I509 Heart failure, unspecified: Secondary | ICD-10-CM | POA: Insufficient documentation

## 2013-11-15 DIAGNOSIS — I5022 Chronic systolic (congestive) heart failure: Secondary | ICD-10-CM | POA: Insufficient documentation

## 2013-11-15 DIAGNOSIS — J449 Chronic obstructive pulmonary disease, unspecified: Secondary | ICD-10-CM | POA: Insufficient documentation

## 2013-11-15 DIAGNOSIS — J4489 Other specified chronic obstructive pulmonary disease: Secondary | ICD-10-CM | POA: Insufficient documentation

## 2013-11-15 DIAGNOSIS — I2589 Other forms of chronic ischemic heart disease: Secondary | ICD-10-CM | POA: Insufficient documentation

## 2013-11-15 NOTE — Progress Notes (Signed)
Cardiac Rehab Medication Review by a Pharmacist  Does the patient  feel that his/her medications are working for him/her?  yes  Has the patient been experiencing any side effects to the medications prescribed?  no  Does the patient measure his/her own blood pressure or blood glucose at home?  yes  Does the patient have any problems obtaining medications due to transportation or finances?   no  Understanding of regimen: good Understanding of indications: good Potential of compliance: excellent    Pharmacist comments: Patient is a pleasant 77 yom who presents to cardiac rehab today for review of his medications.  He tells me that he was having significant dizziness with his original dose of Entresto 100 mg BID, but this has improved a lot since his dose was cut in half to 50 mg BID. Patient states that he takes his Lasix at night around 8pm. We discussed moving this to the morning so that he will see maximum benefit and so that he will not be up at night as much going to the bathroom. Patient expresses good understanding of and compliance with his medications.  Megan E. Supple, Pharm.D Clinical Pharmacy Resident Pager: [phone] 11/15/2013 8:56 AM

## 2013-11-16 ENCOUNTER — Ambulatory Visit (HOSPITAL_BASED_OUTPATIENT_CLINIC_OR_DEPARTMENT_OTHER)
Admission: RE | Admit: 2013-11-16 | Discharge: 2013-11-16 | Disposition: A | Discharge status: Home / Self Care | Payer: Medicare Other | Source: Ambulatory Visit | Attending: Internal Medicine | Admitting: Internal Medicine

## 2013-11-16 ENCOUNTER — Encounter (HOSPITAL_COMMUNITY): Payer: Self-pay

## 2013-11-16 ENCOUNTER — Ambulatory Visit (HOSPITAL_COMMUNITY)
Admission: RE | Admit: 2013-11-16 | Discharge: 2013-11-16 | Disposition: A | Discharge status: Home / Self Care | Payer: Medicare Other | Source: Ambulatory Visit | Attending: Cardiology | Admitting: Cardiology

## 2013-11-16 VITALS — BP 132/68 | HR 60 | Resp 18 | Wt 129.8 lb

## 2013-11-16 DIAGNOSIS — Z9861 Coronary angioplasty status: Secondary | ICD-10-CM | POA: Diagnosis not present

## 2013-11-16 DIAGNOSIS — I5022 Chronic systolic (congestive) heart failure: Secondary | ICD-10-CM | POA: Diagnosis not present

## 2013-11-16 DIAGNOSIS — I6529 Occlusion and stenosis of unspecified carotid artery: Secondary | ICD-10-CM

## 2013-11-16 DIAGNOSIS — I5189 Other ill-defined heart diseases: Secondary | ICD-10-CM | POA: Diagnosis not present

## 2013-11-16 DIAGNOSIS — Z7982 Long term (current) use of aspirin: Secondary | ICD-10-CM | POA: Diagnosis not present

## 2013-11-16 DIAGNOSIS — J449 Chronic obstructive pulmonary disease, unspecified: Secondary | ICD-10-CM | POA: Diagnosis not present

## 2013-11-16 DIAGNOSIS — I739 Peripheral vascular disease, unspecified: Secondary | ICD-10-CM | POA: Diagnosis not present

## 2013-11-16 DIAGNOSIS — I2510 Atherosclerotic heart disease of native coronary artery without angina pectoris: Secondary | ICD-10-CM | POA: Diagnosis not present

## 2013-11-16 DIAGNOSIS — Z87891 Personal history of nicotine dependence: Secondary | ICD-10-CM | POA: Diagnosis not present

## 2013-11-16 DIAGNOSIS — E785 Hyperlipidemia, unspecified: Secondary | ICD-10-CM

## 2013-11-16 DIAGNOSIS — I2589 Other forms of chronic ischemic heart disease: Secondary | ICD-10-CM | POA: Diagnosis not present

## 2013-11-16 DIAGNOSIS — J4489 Other specified chronic obstructive pulmonary disease: Secondary | ICD-10-CM | POA: Insufficient documentation

## 2013-11-16 DIAGNOSIS — I10 Essential (primary) hypertension: Secondary | ICD-10-CM | POA: Insufficient documentation

## 2013-11-16 DIAGNOSIS — I509 Heart failure, unspecified: Secondary | ICD-10-CM | POA: Insufficient documentation

## 2013-11-16 DIAGNOSIS — I517 Cardiomegaly: Secondary | ICD-10-CM | POA: Diagnosis not present

## 2013-11-16 DIAGNOSIS — I359 Nonrheumatic aortic valve disorder, unspecified: Secondary | ICD-10-CM

## 2013-11-16 MED ORDER — LISINOPRIL 5 MG PO TABS: 5.0000 mg | ORAL_TABLET | Freq: Every day | ORAL | Status: DC

## 2013-11-16 NOTE — Progress Notes (Signed)
  Echocardiogram 2D Echocardiogram has been performed.  Erik [name] FRANCES 11/16/2013, 10:51 AM

## 2013-11-16 NOTE — Patient Instructions (Signed)
STOP Entresto STOP Lasix START Lisinopril  Please return your Life Vest, you no longer need to wear the device.  Your physician recommends that you schedule a follow-up appointment in: 6 months  Do the following things EVERYDAY: 1) Weigh yourself in the morning before breakfast. Write it down and keep it in a log. 2) Take your medicines as prescribed 3) Eat low salt foods-Limit salt (sodium) to 2000 mg per day.  4) Stay as active as you can everyday 5) Limit all fluids for the day to less than 2 liters 6)

## 2013-11-16 NOTE — Progress Notes (Signed)
Patient ID: NICHOLOUS GIRGENTI, male   DOB: 05-04-1940, 73 y.o.   MRN: [id number] PCP: Dr [name] Vascular: Dr Scot Dock Cardiology: Dr Aundra Dubin   HPI: Mr Lepera is a 73 year old with a history of COPD, HTN , PAD with bilateral CEAs, ICM, CAD s/p PCI of proximal LAD and proximal RCA (09/1243), chronic systolic HF and prior tobacco abuse.   Admitted with dyspnea in 08/2013. Found to have 2v CAD and EF 20-25%, which was a new finding. Wearing LifeVest.   Blood pressure continues to run in the 80D systolic at times at home though it is higher in the office today.  He has occasional lightheadedness.  No chest pain.  No exertional dyspnea though he is not very active.  He has not started cardiac rehab yet.   I reviewed today's echo. EF is increased to 55-60% with mild apical hypokinesis, septal-lateral dyssynchrony, and normal RV size/systolic function.   Labs 09/13/13 K 4.1 Creatinine 0.95  Labs 10/15/13 K 4.1 Cr 0.7 => 1.07  ROS: All systems negative except as listed in HPI, PMH and Problem List.  Family Status  Relation Status Death Age  . Brother Alive   . Mother Alive     HTN  . Father Deceased     brain tumor  . Brother Alive     HLD, Heart disease  . Brother Alive     HLD, CAD  . Brother Alive     HLD, MS    History   Social History  . Marital Status: Married    Spouse Name: N/A    Number of Children: N/A  . Years of Education: N/A   Occupational History  . Not on file.   Social History Main Topics  . Smoking status: Former Smoker -- 1.50 packs/day for .5 years    Types: Cigarettes  . Smokeless tobacco: Former Systems developer    Quit date: 09/10/2013  . Alcohol Use: No  . Drug Use: No  . Sexual Activity: Not on file   Other Topics Concern  . Not on file   Social History Narrative   Lives with wife in [place]. Retired from the post office                   Past Medical History  Diagnosis Date  . Coronary artery disease     a. LHC (08/2013): Lmain: short 30% distal,  LAD: sml D1 & D2, 70% ostial D1, 95-99% LAD stenosis prox D2 LCx: sml/mod ramus subtot. occluded, 40% ostial set off lg OM1, 40% AV LCx after OM1, RCA: 90% prox (DES to RCA and prox LAD)  . Hyperlipidemia   . COPD (chronic obstructive pulmonary disease)   . BPH (benign prostatic hypertrophy)   . History of colonic polyps   . Hypertension   . Heart murmur   . Collagen vascular disease   . Chronic systolic heart failure     a. EF 20-25%, mild LVH, mod HK, mid apicalanteroseptal myocardium, mild MR, LA mod dilated  . Ischemic cardiomyopathy   . Carotid artery stenosis     a. Bilateral CEA    Current Outpatient Prescriptions  Medication Sig Dispense Refill  . aspirin 81 MG tablet Take 81 mg by mouth daily.        . bisoprolol (ZEBETA) 5 MG tablet Take 1 tablet (5 mg total) by mouth daily.  30 tablet  6  . ezetimibe-simvastatin (VYTORIN) 10-40 MG per tablet Take 1 tablet by mouth at bedtime.      Marland Kitchen  fish oil-omega-3 fatty acids 1000 MG capsule Take 1 g by mouth daily.        Marland Kitchen ibuprofen (ADVIL,MOTRIN) 200 MG tablet Take 200 mg by mouth as needed for pain.      . nitroGLYCERIN (NITROSTAT) 0.4 MG SL tablet Place 1 tablet (0.4 mg total) under the tongue every 5 (five) minutes x 3 doses as needed for chest pain.  30 tablet  12  . tamsulosin (FLOMAX) 0.4 MG CAPS capsule Take 0.4 mg by mouth daily.      . ticagrelor (BRILINTA) 90 MG TABS tablet Take 1 tablet (90 mg total) by mouth 2 (two) times daily.  180 tablet  6  . lisinopril (PRINIVIL,ZESTRIL) 5 MG tablet Take 1 tablet (5 mg total) by mouth daily.  30 tablet  6   No current facility-administered medications for this encounter.    Filed Vitals:   11/16/13 1051  BP: 132/68  Pulse: 60  Resp: 18  Weight: 129 lb 12 oz (58.854 kg)  SpO2: 98%     PHYSICAL EXAM: General:  Elderly. Thin No resp difficulty Wife present  HEENT: normal Neck: supple. JVP flat. Carotids 2+ bilaterally; no bruits. No lymphadenopathy or thryomegaly appreciated. R  and L neck scars  Cor: PMI normal. Regular rate & rhythm. No rubs, gallops or murmurs. Lungs: clear with reduced BS throughout Abdomen: soft, nontender, nondistended. No hepatosplenomegaly. No bruits or masses. Good bowel sounds. Extremities: no cyanosis, clubbing, rash, edema Neuro: alert & orientedx3, cranial nerves grossly intact. Moves all 4 extremities w/o difficulty. Affect pleasant.   ASSESSMENT & PLAN:  1. Chronic Systolic Heart Failure: Ischemic cardiomyopathy, EF 20-25% (08/2013).  I reviewed today's echo, which showed EF improved to 55-60%.  NYHA class II symptoms, not volume overloaded.  BP is on the low side most of the time.   - I think that he can stop Lasix. - Given ongoing low BP and lightheadedness at times, I will have him stop Entresto and restart on lisinopril 5 mg daily.  - Continue bisprolol 5 mg daily.  - He can remove Lifevest.  2. CAD: LHC 09/11/13 - 2 vessel disease that required PCI of proximal LAD and proximal RCA. Dual antiplatelet therapy for minimum of one year (09/2014). Continue Brilinta 90 mg BID and Aspirin 81 mg daily, will likely transition off Brilinta and onto clopidogrel 75 mg daily at the end of 1 year of Brilinta. No chest pain.  He will begin cardiac rehab soon. 4. COPD:  Has quit smoking and congratulated patient on this success. Had normal spirometry 02/2013.   5. H/O of bilateral CEAs: Followed at [facility].   6. Hyperlipidemia: Good lipids in 7/15, continue Vytorin.   [name] 11/16/2013

## 2013-11-21 ENCOUNTER — Encounter (HOSPITAL_COMMUNITY)
Admission: RE | Admit: 2013-11-21 | Discharge: 2013-11-21 | Disposition: A | Discharge status: Home / Self Care | Payer: Medicare Other | Source: Ambulatory Visit | Attending: Cardiology | Admitting: Cardiology

## 2013-11-21 DIAGNOSIS — I2589 Other forms of chronic ischemic heart disease: Secondary | ICD-10-CM | POA: Diagnosis not present

## 2013-11-21 DIAGNOSIS — I509 Heart failure, unspecified: Secondary | ICD-10-CM | POA: Diagnosis not present

## 2013-11-21 DIAGNOSIS — I5022 Chronic systolic (congestive) heart failure: Secondary | ICD-10-CM | POA: Diagnosis not present

## 2013-11-21 DIAGNOSIS — J4489 Other specified chronic obstructive pulmonary disease: Secondary | ICD-10-CM | POA: Diagnosis not present

## 2013-11-21 DIAGNOSIS — I2510 Atherosclerotic heart disease of native coronary artery without angina pectoris: Secondary | ICD-10-CM | POA: Diagnosis not present

## 2013-11-21 DIAGNOSIS — J449 Chronic obstructive pulmonary disease, unspecified: Secondary | ICD-10-CM | POA: Diagnosis not present

## 2013-11-21 NOTE — Progress Notes (Signed)
Pt started cardiac rehab today.  Pt tolerated light exercise without difficulty. Telemetry rhythm Sinus vital signs stable. Will continue to monitor the patient throughout  the program. Lee's life vest was discontinued. Mr Kelliher's long term goals are to become healthy and fit.

## 2013-11-23 ENCOUNTER — Encounter (HOSPITAL_COMMUNITY)
Admission: RE | Admit: 2013-11-23 | Discharge: 2013-11-23 | Disposition: A | Discharge status: Home / Self Care | Payer: Medicare Other | Source: Ambulatory Visit | Attending: Cardiology | Admitting: Cardiology

## 2013-11-23 DIAGNOSIS — I5022 Chronic systolic (congestive) heart failure: Secondary | ICD-10-CM | POA: Diagnosis not present

## 2013-11-26 ENCOUNTER — Encounter (HOSPITAL_COMMUNITY)
Admission: RE | Admit: 2013-11-26 | Discharge: 2013-11-26 | Disposition: A | Discharge status: Home / Self Care | Payer: Medicare Other | Source: Ambulatory Visit | Attending: Cardiology | Admitting: Cardiology

## 2013-11-26 DIAGNOSIS — I5022 Chronic systolic (congestive) heart failure: Secondary | ICD-10-CM | POA: Diagnosis not present

## 2013-11-28 ENCOUNTER — Encounter (HOSPITAL_COMMUNITY)
Admission: RE | Admit: 2013-11-28 | Discharge: 2013-11-28 | Disposition: A | Discharge status: Home / Self Care | Payer: Medicare Other | Source: Ambulatory Visit | Attending: Cardiology | Admitting: Cardiology

## 2013-11-28 DIAGNOSIS — I5022 Chronic systolic (congestive) heart failure: Secondary | ICD-10-CM | POA: Diagnosis not present

## 2013-11-28 NOTE — Progress Notes (Signed)
Erik [name] 73 y.o. male Nutrition Note Spoke with pt. Nutrition Plan and Nutrition Survey goals reviewed with pt. Pt is following Step 1 of the Therapeutic Lifestyle Changes diet. Pt states he is watching his sodium intake closely due to CHF. Pt encouraged to change from 2% milk to low-fat milk and from white bread to wheat bread. Pt is unwilling at this time to make any further dietary changes. Pt c/o decreased appetite. Pt states decreased appetite due to taste changes and decreased desire to eat. Ways to make food more palatable discussed. Pt encouraged to talk with his MD if decreased appetite continues or wt loss occurs. Per pt, UBW 143 lb. Pt's current wt is down 10 lb since his procedure. Pt wants to maintain his current wt of 133 lb "because I already bought new clothes." Pt is pre-diabetic. Pre-diabetes discussed. Pt expressed understanding of the information reviewed. Pt aware of nutrition education classes offered. Wt Readings from Last 3 Encounters:  11/16/13 129 lb 12 oz (58.854 kg)  11/15/13 132 lb 15 oz (60.3 kg)  10/25/13 132 lb (59.875 kg)   Nutrition Diagnosis   Food-and nutrition-related knowledge deficit related to lack of exposure to information as related to diagnosis of: ? CVD ? Pre-DM (A1c 6.1)   Nutrition RX/ Estimated Daily Nutrition Needs for: wt maintenance 2000-2250 Kcal, 65-70 gm fat, 13-15 gm sat fat, 1.9-2.2 gm trans-fat, <1500 mg sodium  Nutrition Intervention   Pt's individual nutrition plan reviewed with pt.   Benefits of adopting Therapeutic Lifestyle Changes discussed when Medficts reviewed.   Pt to attend the Portion Distortion class   Pt given handouts for: ? Nutrition I class ? Nutrition II class    Continue client-centered nutrition education by RD, as part of interdisciplinary care. Goal(s)   Pt to describe the benefit of including fruits, vegetables, whole grains, and low-fat dairy products in a heart healthy meal plan. Monitor and Evaluate  progress toward nutrition goal with team. Nutrition Risk:  Change to Moderate Erik [name], M.Ed, RD, LDN, CDE 11/28/2013 10:32 AM

## 2013-11-30 ENCOUNTER — Encounter (HOSPITAL_COMMUNITY)
Admission: RE | Admit: 2013-11-30 | Discharge: 2013-11-30 | Disposition: A | Discharge status: Home / Self Care | Payer: Medicare Other | Source: Ambulatory Visit | Attending: Cardiology | Admitting: Cardiology

## 2013-11-30 DIAGNOSIS — I5022 Chronic systolic (congestive) heart failure: Secondary | ICD-10-CM | POA: Diagnosis not present

## 2013-11-30 NOTE — Progress Notes (Signed)
Reviewed home exercise with pt today.  Pt plans to walk at home for exercise.  Reviewed THR, pulse, RPE, sign and symptoms, NTG use, and when to call 911 or MD.  Pt voiced understanding. Also reviewed quality of life survey with pt.  Pt has several family members that are ill and a grandson that is "out of control".  Pt also hindered by heart condition and states that he does not get out much.  RN made aware, will fax survey results to physician.  Alberteen Sam, MA, ACSM RCEP

## 2013-12-03 ENCOUNTER — Encounter (HOSPITAL_COMMUNITY)
Admission: RE | Admit: 2013-12-03 | Discharge: 2013-12-03 | Disposition: A | Discharge status: Home / Self Care | Payer: Medicare Other | Source: Ambulatory Visit | Attending: Cardiology | Admitting: Cardiology

## 2013-12-03 DIAGNOSIS — I5022 Chronic systolic (congestive) heart failure: Secondary | ICD-10-CM | POA: Diagnosis not present

## 2013-12-05 ENCOUNTER — Encounter (HOSPITAL_COMMUNITY)
Admission: RE | Admit: 2013-12-05 | Discharge: 2013-12-05 | Disposition: A | Discharge status: Home / Self Care | Payer: Medicare Other | Source: Ambulatory Visit | Attending: Cardiology | Admitting: Cardiology

## 2013-12-05 DIAGNOSIS — I5022 Chronic systolic (congestive) heart failure: Secondary | ICD-10-CM | POA: Diagnosis not present

## 2013-12-06 NOTE — Progress Notes (Signed)
Quality of life questionnaire forwarded to Dr Claris Gladden office for review

## 2013-12-07 ENCOUNTER — Encounter (HOSPITAL_COMMUNITY)
Admission: RE | Admit: 2013-12-07 | Discharge: 2013-12-07 | Disposition: A | Discharge status: Home / Self Care | Payer: Medicare Other | Source: Ambulatory Visit | Attending: Cardiology | Admitting: Cardiology

## 2013-12-07 DIAGNOSIS — I5022 Chronic systolic (congestive) heart failure: Secondary | ICD-10-CM | POA: Diagnosis not present

## 2013-12-10 ENCOUNTER — Encounter (HOSPITAL_COMMUNITY)
Admission: RE | Admit: 2013-12-10 | Discharge: 2013-12-10 | Disposition: A | Discharge status: Home / Self Care | Payer: Medicare Other | Source: Ambulatory Visit | Attending: Cardiology | Admitting: Cardiology

## 2013-12-10 DIAGNOSIS — I5022 Chronic systolic (congestive) heart failure: Secondary | ICD-10-CM | POA: Diagnosis not present

## 2013-12-11 ENCOUNTER — Encounter: Payer: Self-pay | Admitting: Family

## 2013-12-12 ENCOUNTER — Ambulatory Visit (HOSPITAL_COMMUNITY)
Admission: RE | Admit: 2013-12-12 | Discharge: 2013-12-12 | Disposition: A | Discharge status: Home / Self Care | Payer: Medicare Other | Source: Ambulatory Visit | Attending: Family | Admitting: Family

## 2013-12-12 ENCOUNTER — Ambulatory Visit (INDEPENDENT_AMBULATORY_CARE_PROVIDER_SITE_OTHER): Payer: Medicare Other | Admitting: Family

## 2013-12-12 ENCOUNTER — Encounter (HOSPITAL_COMMUNITY)
Admission: RE | Admit: 2013-12-12 | Discharge: 2013-12-12 | Disposition: A | Discharge status: Home / Self Care | Payer: Medicare Other | Source: Ambulatory Visit | Attending: Cardiology | Admitting: Cardiology

## 2013-12-12 ENCOUNTER — Encounter: Payer: Self-pay | Admitting: Family

## 2013-12-12 VITALS — BP 159/62 | HR 55 | Resp 18 | Ht 67.0 in | Wt 132.9 lb

## 2013-12-12 DIAGNOSIS — I6529 Occlusion and stenosis of unspecified carotid artery: Secondary | ICD-10-CM | POA: Diagnosis not present

## 2013-12-12 DIAGNOSIS — Z48812 Encounter for surgical aftercare following surgery on the circulatory system: Secondary | ICD-10-CM

## 2013-12-12 DIAGNOSIS — I5022 Chronic systolic (congestive) heart failure: Secondary | ICD-10-CM | POA: Diagnosis not present

## 2013-12-12 NOTE — Patient Instructions (Signed)
Stroke Prevention Some medical conditions and behaviors are associated with an increased chance of having a stroke. You may prevent a stroke by making healthy choices and managing medical conditions. HOW CAN I REDUCE MY RISK OF HAVING A STROKE?   Stay physically active. Get at least 30 minutes of activity on most or all days.  Do not smoke. It may also be helpful to avoid exposure to secondhand smoke.  Limit alcohol use. Moderate alcohol use is considered to be:  No more than 2 drinks per day for men.  No more than 1 drink per day for nonpregnant women.  Eat healthy foods. This involves:  Eating 5 or more servings of fruits and vegetables a day.  Making dietary changes that address high blood pressure (hypertension), high cholesterol, diabetes, or obesity.  Manage your cholesterol levels.  Making food choices that are high in fiber and low in saturated fat, trans fat, and cholesterol may control cholesterol levels.  Take any prescribed medicines to control cholesterol as directed by your health care provider.  Manage your diabetes.  Controlling your carbohydrate and sugar intake is recommended to manage diabetes.  Take any prescribed medicines to control diabetes as directed by your health care provider.  Control your hypertension.  Making food choices that are low in salt (sodium), saturated fat, trans fat, and cholesterol is recommended to manage hypertension.  Take any prescribed medicines to control hypertension as directed by your health care provider.  Maintain a healthy weight.  Reducing calorie intake and making food choices that are low in sodium, saturated fat, trans fat, and cholesterol are recommended to manage weight.  Stop drug abuse.  Avoid taking birth control pills.  Talk to your health care provider about the risks of taking birth control pills if you are over 35 years old, smoke, get migraines, or have ever had a blood clot.  Get evaluated for sleep  disorders (sleep apnea).  Talk to your health care provider about getting a sleep evaluation if you snore a lot or have excessive sleepiness.  Take medicines only as directed by your health care provider.  For some people, aspirin or blood thinners (anticoagulants) are helpful in reducing the risk of forming abnormal blood clots that can lead to stroke. If you have the irregular heart rhythm of atrial fibrillation, you should be on a blood thinner unless there is a good reason you cannot take them.  Understand all your medicine instructions.  Make sure that other conditions (such as anemia or atherosclerosis) are addressed. SEEK IMMEDIATE MEDICAL CARE IF:   You have sudden weakness or numbness of the face, arm, or leg, especially on one side of the body.  Your face or eyelid droops to one side.  You have sudden confusion.  You have trouble speaking (aphasia) or understanding.  You have sudden trouble seeing in one or both eyes.  You have sudden trouble walking.  You have dizziness.  You have a loss of balance or coordination.  You have a sudden, severe headache with no known cause.  You have new chest pain or an irregular heartbeat. Any of these symptoms may represent a serious problem that is an emergency. Do not wait to see if the symptoms will go away. Get medical help at once. Call your local emergency services (911 in [place] Do not drive yourself to the hospital. Document Released: 04/08/2004 Document Revised: 07/16/2013 Document Reviewed: 09/01/2012 ExitCare Patient Information 2015 [employer]. This information is not intended to replace advice given   to you by your health care provider. Make sure you discuss any questions you have with your health care provider.  

## 2013-12-12 NOTE — Progress Notes (Signed)
Intermittent exertional blood pressure elevations noted at cardiac rehab. Will fax exercise flow sheets to Dr. Claris Gladden office for review. Resting blood pressures have been within normal limits

## 2013-12-12 NOTE — Progress Notes (Signed)
Established Carotid Patient   History of Present Illness  Erik [name] is a 73 y.o. male patient of Dr. Scot Dock who is s/p bilateral carotid endarterectomies in February and March of 2010. He returns today for a year follow up.  Patient has Negative history of TIA or stroke symptom.  The patient denies amaurosis fugax or monocular blindness.  The patient  denies facial drooping.  Pt. denies hemiplegia.  The patient denies receptive or expressive aphasia.  Patient denies abdominal or back pain, denies any known family members with a history of aneurysms.   Pt reports New Medical or Surgical History: cardiac stents placed July, 2015, states he may have had an MI  Pt Diabetic: No Pt smoker: former smoker, quit June, 2015  Pt meds include: Statin : Yes ASA: Yes Other anticoagulants/antiplatelets: Brilinta prescribed by his cardiologist for CAD   Past Medical History  Diagnosis Date  . Coronary artery disease     a. LHC (08/2013): L[place]: short 30% distal, LAD: sml D1 & D2, 70% ostial D1, 95-99% LAD stenosis prox D2 LCx: sml/mod ramus subtot. occluded, 40% ostial set off lg OM1, 40% AV LCx after OM1, RCA: 90% prox (DES to RCA and prox LAD)  . Hyperlipidemia   . COPD (chronic obstructive pulmonary disease)   . BPH (benign prostatic hypertrophy)   . History of colonic polyps   . Hypertension   . Heart murmur   . Collagen vascular disease   . Chronic systolic heart failure     a. EF 20-25%, mild LVH, mod HK, mid apicalanteroseptal myocardium, mild MR, LA mod dilated  . Ischemic cardiomyopathy   . Carotid artery stenosis     a. Bilateral CEA    Social History History  Substance Use Topics  . Smoking status: Former Smoker -- 1.50 packs/day for .5 years    Types: Cigarettes  . Smokeless tobacco: Former Systems developer    Quit date: 09/10/2013  . Alcohol Use: No    Family History Family History  Problem Relation Age of Onset  . Heart disease Brother   . Hyperlipidemia Brother   .  Hypertension Mother   . Cancer Father   . Hyperlipidemia Brother   . Hyperlipidemia Brother   . Heart attack Brother   . Hyperlipidemia Brother   . Multiple sclerosis Brother     Surgical History Past Surgical History  Procedure Laterality Date  . Cholecystectomy      Gall Bladder  . Hernia repair    . Carotid endarterectomy  04/17/2008    right  . Carotid endarterectomy  05/30/08    Left  . Cardiac stents  09-2013    Allergies  Allergen Reactions  . Codeine Swelling    REACTION: Throat swells    Current Outpatient Prescriptions  Medication Sig Dispense Refill  . aspirin 81 MG tablet Take 81 mg by mouth daily.        . bisoprolol (ZEBETA) 5 MG tablet Take 1 tablet (5 mg total) by mouth daily.  30 tablet  6  . ezetimibe-simvastatin (VYTORIN) 10-40 MG per tablet Take 1 tablet by mouth at bedtime.      . fish oil-omega-3 fatty acids 1000 MG capsule Take 1 g by mouth daily.        Marland Kitchen ibuprofen (ADVIL,MOTRIN) 200 MG tablet Take 200 mg by mouth as needed for pain.      Marland Kitchen lisinopril (PRINIVIL,ZESTRIL) 5 MG tablet Take 1 tablet (5 mg total) by mouth daily.  30 tablet  6  .  nitroGLYCERIN (NITROSTAT) 0.4 MG SL tablet Place 1 tablet (0.4 mg total) under the tongue every 5 (five) minutes x 3 doses as needed for chest pain.  30 tablet  12  . tamsulosin (FLOMAX) 0.4 MG CAPS capsule Take 0.4 mg by mouth daily.      . ticagrelor (BRILINTA) 90 MG TABS tablet Take 1 tablet (90 mg total) by mouth 2 (two) times daily.  180 tablet  6   No current facility-administered medications for this visit.    Review of Systems : See HPI for pertinent positives and negatives.  Physical Examination  Filed Vitals:   12/12/13 1614  BP: 159/62  Pulse: 55  Resp: 18  Height: 5\' 7"  (1.702 m)  Weight: 132 lb 14.4 oz (60.283 kg)  SpO2: 99%   Body mass index is 20.81 kg/(m^2).  General: WDWN male in NAD GAIT: normal Eyes: PERRLA Pulmonary:  Non-labored, CTAB, Negative  Rales, Negative rhonchi, &  Negative wheezing.  Cardiac: regular Rhythm ,  Positive detected murmur.  VASCULAR EXAM Carotid Bruits Right Left   Positive Positive    Aorta is palpable. Radial pulses are 2+  palpable and equal.                                                                                                                            LE Pulses Right Left       FEMORAL  2+ palpable  2+ palpable        POPLITEAL  1+ palpable   2+ palpable       POSTERIOR TIBIAL  2+ palpable   2+ palpable        DORSALIS PEDIS      ANTERIOR TIBIAL not palpable  1+ palpable     Gastrointestinal: soft, nontender, BS WNL, no r/g,  negative masses.  Musculoskeletal: Negative muscle atrophy/wasting. M/S 5/5 throughout, Extremities without ischemic changes.  Neurologic: A&O X 3; Appropriate Affect ; SENSATION ;normal;  Speech is normal CN 2-12 intact, Pain and light touch intact in extremities, Motor exam as listed above.   Non-Invasive Vascular Imaging CAROTID DUPLEX 12/12/2013   CEREBROVASCULAR DUPLEX EVALUATION    INDICATION: Follow-up carotid artery disease     PREVIOUS INTERVENTION(S): Right carotid endarterectomy 04/17/2008 Left carotid endarterectomy 05/30/2008    DUPLEX EXAM:     RIGHT  LEFT  Peak Systolic Velocities (cm/s) End Diastolic Velocities (cm/s) Plaque LOCATION Peak Systolic Velocities (cm/s) End Diastolic Velocities (cm/s) Plaque  95 10  CCA PROXIMAL 103 15   80 14  CCA MID 101 20   63 8  CCA DISTAL 75 14   133 0  ECA 89 3   83 22  ICA PROXIMAL 72 15   113 30  ICA MID 114 26   100 23  ICA DISTAL 152 34      ICA / CCA Ratio (PSV)   Antegrade  Vertebral Flow Antegrade    Brachial Systolic Pressure (mmHg)   Within  normal limits  Brachial Artery Waveforms Within normal limits     Plaque Morphology:  HM = Homogeneous, HT = Heterogeneous, CP = Calcific Plaque, SP = Smooth Plaque, IP = Irregular Plaque     ADDITIONAL FINDINGS:      IMPRESSION: 1. Widely patent bilateral carotid  endarterectomy without evidence of restenosis or hyperplasia. 2. Bilateral vertebral artery is antegrade       Assessment: Erik [name] is a 73 y.o. male who presents with asymptomatic widely patent bilateral carotid endarterectomy without evidence of restenosis or hyperplasia. The  ICA stenosis is  Unchanged from previous exam.  The patient denies abdominal or back pain, denies any known family members with a history of aneurysms, but he does have a prominent palpable abdominal pulse, he is thin and aortic pulse is more readily palpable in thin people. CT abdomen/pelvis result from 2012 made no mention of vasculature as the indication was flank pain after a polypectomy, no AAA Duplex result on file; will schedule within the next week.   Plan: Follow-up in 1 year with Carotid Duplex scan, and within the next week for AAA Duplex.   I discussed in depth with the patient the nature of atherosclerosis, and emphasized the importance of maximal medical management including strict control of blood pressure, blood glucose, and lipid levels, obtaining regular exercise, and continued cessation of smoking.  The patient is aware that without maximal medical management the underlying atherosclerotic disease process will progress, limiting the benefit of any interventions. The patient was given information about stroke prevention and what symptoms should prompt the patient to seek immediate medical care. Thank you for allowing [place] to participate in this patient's care.  Clemon Chambers, RN, MSN, FNP-C [facility] Office: [phone]  Clinic Physician: Scot Dock  12/12/2013 4:31 PM

## 2013-12-14 ENCOUNTER — Encounter (HOSPITAL_COMMUNITY)
Admission: RE | Admit: 2013-12-14 | Discharge: 2013-12-14 | Disposition: A | Discharge status: Home / Self Care | Payer: Medicare Other | Source: Ambulatory Visit | Attending: Cardiology | Admitting: Cardiology

## 2013-12-14 DIAGNOSIS — Z955 Presence of coronary angioplasty implant and graft: Secondary | ICD-10-CM | POA: Diagnosis not present

## 2013-12-15 ENCOUNTER — Other Ambulatory Visit: Payer: Self-pay | Admitting: Family Medicine

## 2013-12-17 ENCOUNTER — Other Ambulatory Visit: Payer: Self-pay

## 2013-12-17 ENCOUNTER — Encounter (HOSPITAL_COMMUNITY)
Admission: RE | Admit: 2013-12-17 | Discharge: 2013-12-17 | Disposition: A | Discharge status: Home / Self Care | Payer: Medicare Other | Source: Ambulatory Visit | Attending: Cardiology | Admitting: Cardiology

## 2013-12-17 ENCOUNTER — Encounter: Payer: Self-pay | Admitting: Family

## 2013-12-17 DIAGNOSIS — Z955 Presence of coronary angioplasty implant and graft: Secondary | ICD-10-CM | POA: Diagnosis not present

## 2013-12-17 DIAGNOSIS — R0989 Other specified symptoms and signs involving the circulatory and respiratory systems: Secondary | ICD-10-CM

## 2013-12-17 NOTE — Progress Notes (Signed)
Blood pressure noted at 172/80 on the airdyne today patient taken off the bike and switched to the track.  Recheck blood pressure 112/60 after walking a few laps. Heart failure clinic notified of elevated exertional blood pressures at cardiac rehab.  Will take the patient off the bike for now.Will fax exercise flow sheets to Dr. Claris Gladden office for review with today's blood pressures.

## 2013-12-18 ENCOUNTER — Ambulatory Visit (INDEPENDENT_AMBULATORY_CARE_PROVIDER_SITE_OTHER): Payer: Medicare Other | Admitting: Family

## 2013-12-18 ENCOUNTER — Ambulatory Visit (HOSPITAL_COMMUNITY)
Admission: RE | Admit: 2013-12-18 | Discharge: 2013-12-18 | Disposition: A | Discharge status: Home / Self Care | Payer: Medicare Other | Source: Ambulatory Visit | Attending: Family | Admitting: Family

## 2013-12-18 ENCOUNTER — Encounter: Payer: Self-pay | Admitting: Family

## 2013-12-18 VITALS — BP 141/60 | HR 50 | Resp 14 | Ht 67.0 in | Wt 130.8 lb

## 2013-12-18 DIAGNOSIS — Z48812 Encounter for surgical aftercare following surgery on the circulatory system: Secondary | ICD-10-CM | POA: Insufficient documentation

## 2013-12-18 DIAGNOSIS — I6529 Occlusion and stenosis of unspecified carotid artery: Secondary | ICD-10-CM | POA: Diagnosis not present

## 2013-12-18 DIAGNOSIS — I714 Abdominal aortic aneurysm, without rupture, unspecified: Secondary | ICD-10-CM

## 2013-12-18 DIAGNOSIS — R0989 Other specified symptoms and signs involving the circulatory and respiratory systems: Secondary | ICD-10-CM

## 2013-12-18 NOTE — Patient Instructions (Signed)
Abdominal Aortic Aneurysm An aneurysm is a weakened or damaged part of an artery wall that bulges from the normal force of blood pumping through the body. An abdominal aortic aneurysm is an aneurysm that occurs in the lower part of the aorta, the main artery of the body.  The major concern with an abdominal aortic aneurysm is that it can enlarge and burst (rupture) or blood can flow between the layers of the wall of the aorta through a tear (aorticdissection). Both of these conditions can cause bleeding inside the body and can be life threatening unless diagnosed and treated promptly. CAUSES  The exact cause of an abdominal aortic aneurysm is unknown. Some contributing factors are:   A hardening of the arteries caused by the buildup of fat and other substances in the lining of a blood vessel (arteriosclerosis).  Inflammation of the walls of an artery (arteritis).   Connective tissue diseases, such as Marfan syndrome.   Abdominal trauma.   An infection, such as syphilis or staphylococcus, in the wall of the aorta (infectious aortitis) caused by bacteria. RISK FACTORS  Risk factors that contribute to an abdominal aortic aneurysm may include:  Age older than 60 years.   High blood pressure (hypertension).  Male gender.  Ethnicity (white race).  Obesity.  Family history of aneurysm (first degree relatives only).  Tobacco use. PREVENTION  The following healthy lifestyle habits may help decrease your risk of abdominal aortic aneurysm:  Quitting smoking. Smoking can raise your blood pressure and cause arteriosclerosis.  Limiting or avoiding alcohol.  Keeping your blood pressure, blood sugar level, and cholesterol levels within normal limits.  Decreasing your salt intake. In somepeople, too much salt can raise blood pressure and increase your risk of abdominal aortic aneurysm.  Eating a diet low in saturated fats and cholesterol.  Increasing your fiber intake by including  whole grains, vegetables, and fruits in your diet. Eating these foods may help lower blood pressure.  Maintaining a healthy weight.  Staying physically active and exercising regularly. SYMPTOMS  The symptoms of abdominal aortic aneurysm may vary depending on the size and rate of growth of the aneurysm.Most grow slowly and do not have any symptoms. When symptoms do occur, they may include:  Pain (abdomen, side, lower back, or groin). The pain may vary in intensity. A sudden onset of severe pain may indicate that the aneurysm has ruptured.  Feeling full after eating only small amounts of food.  Nausea or vomiting or both.  Feeling a pulsating lump in the abdomen.  Feeling faint or passing out. DIAGNOSIS  Since most unruptured abdominal aortic aneurysms have no symptoms, they are often discovered during diagnostic exams for other conditions. An aneurysm may be found during the following procedures:  Ultrasonography (A one-time screening for abdominal aortic aneurysm by ultrasonography is also recommended for all men aged 65-75 years who have ever smoked).  X-ray exams.  A computed tomography (CT).  Magnetic resonance imaging (MRI).  Angiography or arteriography. TREATMENT  Treatment of an abdominal aortic aneurysm depends on the size of your aneurysm, your age, and risk factors for rupture. Medication to control blood pressure and pain may be used to manage aneurysms smaller than 6 cm. Regular monitoring for enlargement may be recommended by your caregiver if:  The aneurysm is 3-4 cm in size (an annual ultrasonography may be recommended).  The aneurysm is 4-4.5 cm in size (an ultrasonography every 6 months may be recommended).  The aneurysm is larger than 4.5 cm in   size (your caregiver may ask that you be examined by a vascular surgeon). If your aneurysm is larger than 6 cm, surgical repair may be recommended. There are two main methods for repair of an aneurysm:   Endovascular  repair (a minimally invasive surgery). This is done most often.  Open repair. This method is used if an endovascular repair is not possible. Document Released: 12/09/2004 Document Revised: 06/26/2012 Document Reviewed: 03/31/2012 ExitCare Patient Information 2015 [employer]. This information is not intended to replace advice given to you by your health care provider. Make sure you discuss any questions you have with your health care provider.   Stroke Prevention Some medical conditions and behaviors are associated with an increased chance of having a stroke. You may prevent a stroke by making healthy choices and managing medical conditions. HOW CAN I REDUCE MY RISK OF HAVING A STROKE?   Stay physically active. Get at least 30 minutes of activity on most or all days.  Do not smoke. It may also be helpful to avoid exposure to secondhand smoke.  Limit alcohol use. Moderate alcohol use is considered to be:  No more than 2 drinks per day for men.  No more than 1 drink per day for nonpregnant women.  Eat healthy foods. This involves:  Eating 5 or more servings of fruits and vegetables a day.  Making dietary changes that address high blood pressure (hypertension), high cholesterol, diabetes, or obesity.  Manage your cholesterol levels.  Making food choices that are high in fiber and low in saturated fat, trans fat, and cholesterol may control cholesterol levels.  Take any prescribed medicines to control cholesterol as directed by your health care provider.  Manage your diabetes.  Controlling your carbohydrate and sugar intake is recommended to manage diabetes.  Take any prescribed medicines to control diabetes as directed by your health care provider.  Control your hypertension.  Making food choices that are low in salt (sodium), saturated fat, trans fat, and cholesterol is recommended to manage hypertension.  Take any prescribed medicines to control hypertension as directed  by your health care provider.  Maintain a healthy weight.  Reducing calorie intake and making food choices that are low in sodium, saturated fat, trans fat, and cholesterol are recommended to manage weight.  Stop drug abuse.  Avoid taking birth control pills.  Talk to your health care provider about the risks of taking birth control pills if you are over 35 years old, smoke, get migraines, or have ever had a blood clot.  Get evaluated for sleep disorders (sleep apnea).  Talk to your health care provider about getting a sleep evaluation if you snore a lot or have excessive sleepiness.  Take medicines only as directed by your health care provider.  For some people, aspirin or blood thinners (anticoagulants) are helpful in reducing the risk of forming abnormal blood clots that can lead to stroke. If you have the irregular heart rhythm of atrial fibrillation, you should be on a blood thinner unless there is a good reason you cannot take them.  Understand all your medicine instructions.  Make sure that other conditions (such as anemia or atherosclerosis) are addressed. SEEK IMMEDIATE MEDICAL CARE IF:   You have sudden weakness or numbness of the face, arm, or leg, especially on one side of the body.  Your face or eyelid droops to one side.  You have sudden confusion.  You have trouble speaking (aphasia) or understanding.  You have sudden trouble seeing in one or   both eyes.  You have sudden trouble walking.  You have dizziness.  You have a loss of balance or coordination.  You have a sudden, severe headache with no known cause.  You have new chest pain or an irregular heartbeat. Any of these symptoms may represent a serious problem that is an emergency. Do not wait to see if the symptoms will go away. Get medical help at once. Call your local emergency services (911 in [place] Do not drive yourself to the hospital. Document Released: 04/08/2004 Document Revised: 07/16/2013  Document Reviewed: 09/01/2012 ExitCare Patient Information 2015 [employer]. This information is not intended to replace advice given to you by your health care provider. Make sure you discuss any questions you have with your health care provider.  

## 2013-12-18 NOTE — Progress Notes (Signed)
[facility] [place] HISTORY AND PHYSICAL   MRN : [id number]  History of Present Illness:   Erik [name] is a 73 y.o. male patient of Dr. Scot Dock who is s/p bilateral carotid endarterectomies in February and March of 2010.  He returns today for AAA Duplex after exam a week ago detected a strongly palpable aortic pulse. Patient has Negative history of TIA or stroke symptom. The patient denies amaurosis fugax or monocular blindness. The patient denies facial drooping.  Pt. denies hemiplegia. The patient denies receptive or expressive aphasia.  Patient denies abdominal or back pain, and since his last visit he has found out that his brother has a AAA also.  Pt reports New Medical or Surgical History: cardiac stents placed July, 2015, states he may have had an MI.  Pt Diabetic: No  Pt smoker: former smoker, quit June, 2015   Pt meds include:  Statin : Yes  ASA: Yes  Other anticoagulants/antiplatelets: Brilinta prescribed by his cardiologist, Dr. Aundra Dubin, for CAD   Current Outpatient Prescriptions  Medication Sig Dispense Refill  . aspirin 81 MG tablet Take 81 mg by mouth daily.        . bisoprolol (ZEBETA) 5 MG tablet Take 1 tablet (5 mg total) by mouth daily.  30 tablet  6  . fish oil-omega-3 fatty acids 1000 MG capsule Take 1 g by mouth daily.        Marland Kitchen ibuprofen (ADVIL,MOTRIN) 200 MG tablet Take 200 mg by mouth as needed for pain.      Marland Kitchen lisinopril (PRINIVIL,ZESTRIL) 5 MG tablet Take 1 tablet (5 mg total) by mouth daily.  30 tablet  6  . nitroGLYCERIN (NITROSTAT) 0.4 MG SL tablet Place 1 tablet (0.4 mg total) under the tongue every 5 (five) minutes x 3 doses as needed for chest pain.  30 tablet  12  . tamsulosin (FLOMAX) 0.4 MG CAPS capsule TAKE 1 CAPSULE DAILY  90 capsule  0  . ticagrelor (BRILINTA) 90 MG TABS tablet Take 1 tablet (90 mg total) by mouth 2 (two) times daily.  180 tablet  6  . VYTORIN 10-40 MG per tablet TAKE 1 TABLET AT BEDTIME  90 tablet  0    No current facility-administered medications for this visit.    Past Medical History  Diagnosis Date  . Coronary artery disease     a. LHC (08/2013): Lmain: short 30% distal, LAD: sml D1 & D2, 70% ostial D1, 95-99% LAD stenosis prox D2 LCx: sml/mod ramus subtot. occluded, 40% ostial set off lg OM1, 40% AV LCx after OM1, RCA: 90% prox (DES to RCA and prox LAD)  . Hyperlipidemia   . COPD (chronic obstructive pulmonary disease)   . BPH (benign prostatic hypertrophy)   . History of colonic polyps   . Hypertension   . Heart murmur   . Collagen vascular disease   . Chronic systolic heart failure     a. EF 20-25%, mild LVH, mod HK, mid apicalanteroseptal myocardium, mild MR, LA mod dilated  . Ischemic cardiomyopathy   . Carotid artery stenosis     a. Bilateral CEA    Social History History  Substance Use Topics  . Smoking status: Former Smoker -- 1.50 packs/day for .5 years    Types: Cigarettes    Quit date: 08/27/2013  . Smokeless tobacco: Former Systems developer    Quit date: 09/10/2013  . Alcohol Use: No    Family History Family History  Problem Relation Age of Onset  .  Heart disease Brother   . Hyperlipidemia Brother   . Hypertension Mother   . Cancer Father   . Hyperlipidemia Brother   . Hyperlipidemia Brother   . Heart attack Brother   . Hyperlipidemia Brother   . Multiple sclerosis Brother     Surgical History Past Surgical History  Procedure Laterality Date  . Cholecystectomy      Gall Bladder  . Hernia repair    . Carotid endarterectomy  04/17/2008    right  . Carotid endarterectomy  05/30/08    Left  . Cardiac stents  09-2013    Allergies  Allergen Reactions  . Codeine Swelling    REACTION: Throat swells    Current Outpatient Prescriptions  Medication Sig Dispense Refill  . aspirin 81 MG tablet Take 81 mg by mouth daily.        . bisoprolol (ZEBETA) 5 MG tablet Take 1 tablet (5 mg total) by mouth daily.  30 tablet  6  . fish oil-omega-3 fatty acids 1000  MG capsule Take 1 g by mouth daily.        Marland Kitchen ibuprofen (ADVIL,MOTRIN) 200 MG tablet Take 200 mg by mouth as needed for pain.      Marland Kitchen lisinopril (PRINIVIL,ZESTRIL) 5 MG tablet Take 1 tablet (5 mg total) by mouth daily.  30 tablet  6  . nitroGLYCERIN (NITROSTAT) 0.4 MG SL tablet Place 1 tablet (0.4 mg total) under the tongue every 5 (five) minutes x 3 doses as needed for chest pain.  30 tablet  12  . tamsulosin (FLOMAX) 0.4 MG CAPS capsule TAKE 1 CAPSULE DAILY  90 capsule  0  . ticagrelor (BRILINTA) 90 MG TABS tablet Take 1 tablet (90 mg total) by mouth 2 (two) times daily.  180 tablet  6  . VYTORIN 10-40 MG per tablet TAKE 1 TABLET AT BEDTIME  90 tablet  0   No current facility-administered medications for this visit.     REVIEW OF SYSTEMS: See HPI for pertinent positives and negatives.  Physical Examination Filed Vitals:   12/18/13 0907  BP: 141/60  Pulse: 50  Resp: 14  Height: 5\' 7"  (1.702 m)  Weight: 130 lb 12.8 oz (59.33 kg)  SpO2: 100%   Body mass index is 20.48 kg/(m^2).  General: WDWN male in NAD  GAIT: normal  Eyes: PERRLA  Pulmonary: Non-labored, CTAB, Negative Rales, Negative rhonchi, & Negative wheezing.  Cardiac: regular Rhythm , Positive detected murmur.   VASCULAR EXAM  Carotid Bruits  Right  Left    Positive  Positive   Aorta is palpable.  Radial pulses are 2+ palpable and equal.  LE Pulses  Right  Left   FEMORAL  2+ palpable  2+ palpable   POPLITEAL  1+ palpable  2+ palpable   POSTERIOR TIBIAL  2+ palpable  2+ palpable   DORSALIS PEDIS  ANTERIOR TIBIAL  not palpable  1+ palpable   Gastrointestinal: soft, nontender, BS WNL, no r/g, negative masses.  Musculoskeletal: Negative muscle atrophy/wasting. M/S 5/5 throughout, Extremities without ischemic changes.  Neurologic: A&O X 3; Appropriate Affect ; SENSATION ;normal;  Speech is normal  CN 2-12 intact, Pain and light touch intact in extremities, Motor exam as listed above.   Significant Diagnostic  Studies:   Non-Invasive Vascular Imaging (12/18/2013):  ABDOMINAL AORTA DUPLEX EVALUATION    INDICATION: Evaluation of abdominal aorta, palpable pulse.    PREVIOUS INTERVENTION(S):     DUPLEX EXAM:     LOCATION DIAMETER AP (cm) DIAMETER TRANSVERSE (cm)  VELOCITIES (cm/sec)  Aorta Proximal 3.15 3.16   Aorta Mid 4.63 4.84 87  Aorta Distal 2.60 2.80 56  Right Common Iliac Artery 0.97 1.15 222  Left Common Iliac Artery Not Visualized  Not Visualized      Previous max aortic diameter:   Date:      ADDITIONAL FINDINGS:     IMPRESSION: Patent abdominal aortic aneurysm measuring approximately 4.63 x 4.84cm in diameter.     Compared to the previous exam:  No prior exam performed at this facility for comparison.      ASSESSMENT:  Erik [name] is a 73 y.o. male who is s/p bilateral carotid endarterectomies in February and March of 2010.  He returns today for AAA Duplex after exam a week ago detected a strongly palpable aortic pulse. AAA Duplex today demonstrates a patent abdominal aortic aneurysm measuring approximately 4.63 x 4.84cm in diameter.  Patient denies abdominal or back pain, patient recently found out that his brother also had a AAA that is under observation.   Patient has Negative history of TIA or stroke symptom. The patient denies amaurosis fugax or monocular blindness. The patient denies facial drooping.  Pt. denies hemiplegia. The patient denies receptive or expressive aphasia   PLAN:   Based on today's exam and non-invasive vascular lab results, the patient will follow up in 6 months with the following tests AAA Duplex, carotid Duplex in a year. I discussed in depth with the patient the nature of atherosclerosis, and emphasized the importance of maximal medical management including strict control of blood pressure, blood glucose, and lipid levels, obtaining regular exercise, and cessation of smoking.  The patient is aware that without maximal medical management  the underlying atherosclerotic disease process will progress, limiting the benefit of any interventions. Consideration for repair of AAA would be made when the size is 5.5 cm, growth > 1 cm/yr, and symptomatic status. The patient was given information about stroke prevention and what symptoms should prompt the patient to seek immediate medical care. The patient was given information about AAA including signs, symptoms, treatment,  what symptoms should prompt the patient to seek immediate medical care, and how to minimize the risk of enlargement and rupture of aneurysms. . Thank you for allowing [place] to participate in this patient's care.  Clemon Chambers, RN, MSN, FNP-C [facility] Office: [phone]  Clinic MD: Early 12/18/2013 9:08 AM

## 2013-12-19 ENCOUNTER — Encounter (HOSPITAL_COMMUNITY)
Admission: RE | Admit: 2013-12-19 | Discharge: 2013-12-19 | Disposition: A | Discharge status: Home / Self Care | Payer: Medicare Other | Source: Ambulatory Visit | Attending: Cardiology | Admitting: Cardiology

## 2013-12-19 NOTE — Progress Notes (Signed)
Patient says he was told he has a 4.4 cm AAA by [facility]. Blood pressure 144/70 heart rate 61. Oxygen saturation 98% on room air.  I did not

## 2013-12-20 ENCOUNTER — Other Ambulatory Visit: Payer: Self-pay | Admitting: *Deleted

## 2013-12-20 DIAGNOSIS — I779 Disorder of arteries and arterioles, unspecified: Secondary | ICD-10-CM

## 2013-12-20 DIAGNOSIS — I714 Abdominal aortic aneurysm, without rupture, unspecified: Secondary | ICD-10-CM

## 2013-12-20 DIAGNOSIS — I739 Peripheral vascular disease, unspecified: Principal | ICD-10-CM

## 2013-12-20 NOTE — Progress Notes (Signed)
Addendum to note on 12/19/2013. I did not let the patient exercise and faxed AAA parameters to Dr Nicole Cella office for his review.

## 2013-12-21 ENCOUNTER — Encounter (HOSPITAL_COMMUNITY)
Admission: RE | Admit: 2013-12-21 | Discharge: 2013-12-21 | Disposition: A | Discharge status: Home / Self Care | Payer: Medicare Other | Source: Ambulatory Visit | Attending: Cardiology | Admitting: Cardiology

## 2013-12-24 ENCOUNTER — Encounter (HOSPITAL_COMMUNITY): Payer: Medicare Other

## 2013-12-26 ENCOUNTER — Encounter (HOSPITAL_COMMUNITY): Payer: Medicare Other

## 2013-12-27 ENCOUNTER — Telehealth (HOSPITAL_COMMUNITY): Payer: Self-pay | Admitting: *Deleted

## 2013-12-28 ENCOUNTER — Encounter (HOSPITAL_COMMUNITY)
Admission: RE | Admit: 2013-12-28 | Discharge: 2013-12-28 | Disposition: A | Discharge status: Home / Self Care | Payer: Medicare Other | Source: Ambulatory Visit | Attending: Cardiology | Admitting: Cardiology

## 2013-12-28 DIAGNOSIS — Z955 Presence of coronary angioplasty implant and graft: Secondary | ICD-10-CM | POA: Diagnosis not present

## 2013-12-31 ENCOUNTER — Encounter (HOSPITAL_COMMUNITY)
Admission: RE | Admit: 2013-12-31 | Discharge: 2013-12-31 | Disposition: A | Discharge status: Home / Self Care | Payer: Medicare Other | Source: Ambulatory Visit | Attending: Cardiology | Admitting: Cardiology

## 2013-12-31 DIAGNOSIS — Z955 Presence of coronary angioplasty implant and graft: Secondary | ICD-10-CM | POA: Diagnosis not present

## 2014-01-02 ENCOUNTER — Encounter (HOSPITAL_COMMUNITY)
Admission: RE | Admit: 2014-01-02 | Discharge: 2014-01-02 | Disposition: A | Discharge status: Home / Self Care | Payer: Medicare Other | Source: Ambulatory Visit | Attending: Cardiology | Admitting: Cardiology

## 2014-01-02 DIAGNOSIS — Z955 Presence of coronary angioplasty implant and graft: Secondary | ICD-10-CM | POA: Diagnosis not present

## 2014-01-04 ENCOUNTER — Encounter (HOSPITAL_COMMUNITY)
Admission: RE | Admit: 2014-01-04 | Discharge: 2014-01-04 | Disposition: A | Discharge status: Home / Self Care | Payer: Medicare Other | Source: Ambulatory Visit | Attending: Cardiology | Admitting: Cardiology

## 2014-01-04 DIAGNOSIS — Z955 Presence of coronary angioplasty implant and graft: Secondary | ICD-10-CM | POA: Diagnosis not present

## 2014-01-07 ENCOUNTER — Encounter (HOSPITAL_COMMUNITY)
Admission: RE | Admit: 2014-01-07 | Discharge: 2014-01-07 | Disposition: A | Discharge status: Home / Self Care | Payer: Medicare Other | Source: Ambulatory Visit | Attending: Cardiology | Admitting: Cardiology

## 2014-01-07 DIAGNOSIS — Z955 Presence of coronary angioplasty implant and graft: Secondary | ICD-10-CM | POA: Diagnosis not present

## 2014-01-09 ENCOUNTER — Encounter (HOSPITAL_COMMUNITY)
Admission: RE | Admit: 2014-01-09 | Discharge: 2014-01-09 | Disposition: A | Discharge status: Home / Self Care | Payer: Medicare Other | Source: Ambulatory Visit | Attending: Cardiology | Admitting: Cardiology

## 2014-01-09 DIAGNOSIS — Z955 Presence of coronary angioplasty implant and graft: Secondary | ICD-10-CM | POA: Diagnosis not present

## 2014-01-11 ENCOUNTER — Encounter (HOSPITAL_COMMUNITY)
Admission: RE | Admit: 2014-01-11 | Discharge: 2014-01-11 | Disposition: A | Discharge status: Home / Self Care | Payer: Medicare Other | Source: Ambulatory Visit | Attending: Cardiology | Admitting: Cardiology

## 2014-01-11 DIAGNOSIS — Z955 Presence of coronary angioplasty implant and graft: Secondary | ICD-10-CM | POA: Diagnosis not present

## 2014-01-14 ENCOUNTER — Encounter (HOSPITAL_COMMUNITY)
Admission: RE | Admit: 2014-01-14 | Discharge: 2014-01-14 | Disposition: A | Discharge status: Home / Self Care | Payer: Medicare Other | Source: Ambulatory Visit | Attending: Cardiology | Admitting: Cardiology

## 2014-01-14 DIAGNOSIS — Z955 Presence of coronary angioplasty implant and graft: Secondary | ICD-10-CM | POA: Diagnosis not present

## 2014-01-16 ENCOUNTER — Encounter (HOSPITAL_COMMUNITY): Payer: Medicare Other

## 2014-01-16 DIAGNOSIS — Z955 Presence of coronary angioplasty implant and graft: Secondary | ICD-10-CM | POA: Diagnosis not present

## 2014-01-18 ENCOUNTER — Encounter (HOSPITAL_COMMUNITY)
Admission: RE | Admit: 2014-01-18 | Discharge: 2014-01-18 | Disposition: A | Discharge status: Home / Self Care | Payer: Medicare Other | Source: Ambulatory Visit | Attending: Cardiology | Admitting: Cardiology

## 2014-01-18 DIAGNOSIS — Z955 Presence of coronary angioplasty implant and graft: Secondary | ICD-10-CM | POA: Diagnosis not present

## 2014-01-21 ENCOUNTER — Encounter (HOSPITAL_COMMUNITY)
Admission: RE | Admit: 2014-01-21 | Discharge: 2014-01-21 | Disposition: A | Discharge status: Home / Self Care | Payer: Medicare Other | Source: Ambulatory Visit | Attending: Cardiology | Admitting: Cardiology

## 2014-01-21 DIAGNOSIS — Z955 Presence of coronary angioplasty implant and graft: Secondary | ICD-10-CM | POA: Diagnosis not present

## 2014-01-23 ENCOUNTER — Encounter (HOSPITAL_COMMUNITY)
Admission: RE | Admit: 2014-01-23 | Discharge: 2014-01-23 | Disposition: A | Discharge status: Home / Self Care | Payer: Medicare Other | Source: Ambulatory Visit | Attending: Cardiology | Admitting: Cardiology

## 2014-01-23 DIAGNOSIS — Z955 Presence of coronary angioplasty implant and graft: Secondary | ICD-10-CM | POA: Diagnosis not present

## 2014-01-25 ENCOUNTER — Encounter (HOSPITAL_COMMUNITY)
Admission: RE | Admit: 2014-01-25 | Discharge: 2014-01-25 | Disposition: A | Discharge status: Home / Self Care | Payer: Medicare Other | Source: Ambulatory Visit | Attending: Cardiology | Admitting: Cardiology

## 2014-01-25 DIAGNOSIS — Z955 Presence of coronary angioplasty implant and graft: Secondary | ICD-10-CM | POA: Diagnosis not present

## 2014-01-25 NOTE — Progress Notes (Signed)
Mr Kollmann continues to have intermittent exertional blood pressure's at cardiac rehab. We have parameters from Dr Scot Dock to keep Mr Pain's blood below 170/100. Exit blood pressure 106/58 heart rate 65. Heart Failure clinic called and notified about  Blood pressures at cardiac rehab. No complaints today. Will continue to monitor the patient throughout  the program.

## 2014-01-28 ENCOUNTER — Encounter (HOSPITAL_COMMUNITY)
Admission: RE | Admit: 2014-01-28 | Discharge: 2014-01-28 | Disposition: A | Discharge status: Home / Self Care | Payer: Medicare Other | Source: Ambulatory Visit | Attending: Cardiology | Admitting: Cardiology

## 2014-01-28 DIAGNOSIS — Z955 Presence of coronary angioplasty implant and graft: Secondary | ICD-10-CM | POA: Diagnosis not present

## 2014-01-30 ENCOUNTER — Telehealth (HOSPITAL_COMMUNITY): Payer: Self-pay | Admitting: *Deleted

## 2014-01-30 ENCOUNTER — Encounter (HOSPITAL_COMMUNITY)
Admission: RE | Admit: 2014-01-30 | Discharge: 2014-01-30 | Disposition: A | Discharge status: Home / Self Care | Payer: Medicare Other | Source: Ambulatory Visit | Attending: Cardiology | Admitting: Cardiology

## 2014-01-30 DIAGNOSIS — Z955 Presence of coronary angioplasty implant and graft: Secondary | ICD-10-CM | POA: Diagnosis not present

## 2014-01-30 MED ORDER — BISOPROLOL FUMARATE 5 MG PO TABS: 7.5000 mg | ORAL_TABLET | Freq: Every day | ORAL | Status: DC

## 2014-01-30 NOTE — Telephone Encounter (Signed)
Received forms from cardiac rehab that pt's BP has been elevated with exertion, running 110-180s/50-80s per Dr Haroldine Laws have pt increase Bisoprolol to 7.5 mg daily, pt is aware and agreeable

## 2014-02-01 ENCOUNTER — Encounter (HOSPITAL_COMMUNITY)
Admission: RE | Admit: 2014-02-01 | Discharge: 2014-02-01 | Disposition: A | Discharge status: Home / Self Care | Payer: Medicare Other | Source: Ambulatory Visit | Attending: Cardiology | Admitting: Cardiology

## 2014-02-01 DIAGNOSIS — Z955 Presence of coronary angioplasty implant and graft: Secondary | ICD-10-CM | POA: Diagnosis not present

## 2014-02-04 ENCOUNTER — Encounter (HOSPITAL_COMMUNITY)
Admission: RE | Admit: 2014-02-04 | Discharge: 2014-02-04 | Disposition: A | Discharge status: Home / Self Care | Payer: Medicare Other | Source: Ambulatory Visit | Attending: Cardiology | Admitting: Cardiology

## 2014-02-04 DIAGNOSIS — Z955 Presence of coronary angioplasty implant and graft: Secondary | ICD-10-CM | POA: Diagnosis not present

## 2014-02-06 ENCOUNTER — Encounter (HOSPITAL_COMMUNITY)
Admission: RE | Admit: 2014-02-06 | Discharge: 2014-02-06 | Disposition: A | Discharge status: Home / Self Care | Payer: Medicare Other | Source: Ambulatory Visit | Attending: Cardiology | Admitting: Cardiology

## 2014-02-06 DIAGNOSIS — Z955 Presence of coronary angioplasty implant and graft: Secondary | ICD-10-CM | POA: Diagnosis not present

## 2014-02-11 ENCOUNTER — Encounter (HOSPITAL_COMMUNITY)
Admission: RE | Admit: 2014-02-11 | Discharge: 2014-02-11 | Disposition: A | Discharge status: Home / Self Care | Payer: Medicare Other | Source: Ambulatory Visit | Attending: Cardiology | Admitting: Cardiology

## 2014-02-11 DIAGNOSIS — Z955 Presence of coronary angioplasty implant and graft: Secondary | ICD-10-CM | POA: Diagnosis not present

## 2014-02-13 ENCOUNTER — Encounter (HOSPITAL_COMMUNITY)
Admission: RE | Admit: 2014-02-13 | Discharge: 2014-02-13 | Disposition: A | Discharge status: Home / Self Care | Payer: Medicare Other | Source: Ambulatory Visit | Attending: Cardiology | Admitting: Cardiology

## 2014-02-13 DIAGNOSIS — Z955 Presence of coronary angioplasty implant and graft: Secondary | ICD-10-CM | POA: Insufficient documentation

## 2014-02-15 ENCOUNTER — Encounter (HOSPITAL_COMMUNITY)
Admission: RE | Admit: 2014-02-15 | Discharge: 2014-02-15 | Disposition: A | Discharge status: Home / Self Care | Payer: Medicare Other | Source: Ambulatory Visit | Attending: Cardiology | Admitting: Cardiology

## 2014-02-15 DIAGNOSIS — Z955 Presence of coronary angioplasty implant and graft: Secondary | ICD-10-CM | POA: Diagnosis not present

## 2014-02-18 ENCOUNTER — Encounter (HOSPITAL_COMMUNITY)
Admission: RE | Admit: 2014-02-18 | Discharge: 2014-02-18 | Disposition: A | Discharge status: Home / Self Care | Payer: Medicare Other | Source: Ambulatory Visit | Attending: Cardiology | Admitting: Cardiology

## 2014-02-18 DIAGNOSIS — Z955 Presence of coronary angioplasty implant and graft: Secondary | ICD-10-CM | POA: Diagnosis not present

## 2014-02-20 ENCOUNTER — Encounter (HOSPITAL_COMMUNITY)
Admission: RE | Admit: 2014-02-20 | Discharge: 2014-02-20 | Disposition: A | Discharge status: Home / Self Care | Payer: Medicare Other | Source: Ambulatory Visit | Attending: Cardiology | Admitting: Cardiology

## 2014-02-20 DIAGNOSIS — Z955 Presence of coronary angioplasty implant and graft: Secondary | ICD-10-CM | POA: Diagnosis not present

## 2014-02-21 ENCOUNTER — Encounter (HOSPITAL_COMMUNITY): Payer: Self-pay | Admitting: Cardiovascular Disease

## 2014-02-21 NOTE — Progress Notes (Signed)
Pt will graduate from the cardiac rehab phase II program with the completion of 36 visits on 02/22/14.  Pt good excellent attendance to exercise and education classes.  Pt showed measurable growth with met level progression.  Pt met level in September 3.1 to 3.7 in December.  Medication list reconciled.  Pt verbalizes compliance to medication and denies any barriers.  Repeat PHQ2 score 0.  Pt feels he has adequate support system and demonstrates appropriate and healthy coping skills.  Pt has a positive outlook on life and feels he is "making strides" every day.  Pt plans to continue home exercise with walking in his neighborhood every day.  Based upon his attendance and activity progression pt will be successful in this.  Pt met his short term goal to get rid of life vest early in his participation in cardiac rehab.  Pt with no further complaint or issue with overall heart function.  Pt long term goal is to be healthy and fit.  Pt feels he has achieved this goal based upon the activities of the home he can do without andy difficultly.  It was a pleasure to have pt participate ing cardiac rehab. Cherre Huger, BSN

## 2014-02-22 ENCOUNTER — Encounter (HOSPITAL_COMMUNITY)
Admission: RE | Admit: 2014-02-22 | Discharge: 2014-02-22 | Disposition: A | Discharge status: Home / Self Care | Payer: Medicare Other | Source: Ambulatory Visit | Attending: Cardiology | Admitting: Cardiology

## 2014-02-22 DIAGNOSIS — Z955 Presence of coronary angioplasty implant and graft: Secondary | ICD-10-CM | POA: Diagnosis not present

## 2014-02-25 ENCOUNTER — Encounter (HOSPITAL_COMMUNITY): Payer: Medicare Other

## 2014-02-28 ENCOUNTER — Other Ambulatory Visit (HOSPITAL_COMMUNITY): Payer: Self-pay | Admitting: Adult Health

## 2014-03-17 ENCOUNTER — Other Ambulatory Visit (HOSPITAL_COMMUNITY): Payer: Self-pay | Admitting: Adult Health

## 2014-03-18 ENCOUNTER — Telehealth: Payer: Self-pay | Admitting: Family Medicine

## 2014-03-18 ENCOUNTER — Other Ambulatory Visit (HOSPITAL_COMMUNITY): Payer: Self-pay

## 2014-03-18 NOTE — Telephone Encounter (Signed)
Last office visit 02/27/2013.  No future appointments scheduled.  Ok to refill?

## 2014-03-18 NOTE — Telephone Encounter (Signed)
Okay ot refill, but needs appt for CPX before further refills.

## 2014-03-19 NOTE — Telephone Encounter (Signed)
Please scheduled CPE with fasting labs prior with Dr. [name].  Will need appointment in order to keep getting refills on his medications.

## 2014-03-20 NOTE — Telephone Encounter (Signed)
CPE scheduled 04/16/14 Labs 04/09/14

## 2014-03-23 ENCOUNTER — Other Ambulatory Visit (HOSPITAL_COMMUNITY): Payer: Self-pay | Admitting: Adult Health

## 2014-03-24 ENCOUNTER — Other Ambulatory Visit (HOSPITAL_COMMUNITY): Payer: Self-pay | Admitting: Adult Health

## 2014-04-08 ENCOUNTER — Telehealth: Payer: Self-pay | Admitting: Family Medicine

## 2014-04-08 DIAGNOSIS — E785 Hyperlipidemia, unspecified: Secondary | ICD-10-CM

## 2014-04-08 DIAGNOSIS — R7303 Prediabetes: Secondary | ICD-10-CM

## 2014-04-08 NOTE — Telephone Encounter (Signed)
-----   Message from Ellamae Sia sent at 04/04/2014  5:25 PM EST ----- Regarding: Lab orders for Tuesday, 1.26.16 Patient is scheduled for CPX labs, please order future labs, Thanks , Karna Christmas

## 2014-04-09 ENCOUNTER — Other Ambulatory Visit (INDEPENDENT_AMBULATORY_CARE_PROVIDER_SITE_OTHER): Payer: Medicare Other

## 2014-04-09 DIAGNOSIS — E785 Hyperlipidemia, unspecified: Secondary | ICD-10-CM | POA: Diagnosis not present

## 2014-04-09 DIAGNOSIS — R7309 Other abnormal glucose: Secondary | ICD-10-CM | POA: Diagnosis not present

## 2014-04-09 DIAGNOSIS — R7303 Prediabetes: Secondary | ICD-10-CM

## 2014-04-09 LAB — LIPID PANEL
Cholesterol: 99 mg/dL (ref 0–200)
HDL: 44.5 mg/dL (ref >39.00)
LDL Cholesterol: 37 mg/dL (ref 0–99)
NonHDL: 54.5
TRIGLYCERIDES: 90 mg/dL (ref 0.0–149.0)
Total CHOL/HDL Ratio: 2
VLDL: 18 mg/dL (ref 0.0–40.0)

## 2014-04-09 LAB — COMPREHENSIVE METABOLIC PANEL
ALT: 12 U/L (ref 0–53)
AST: 16 U/L (ref 0–37)
Albumin: 3.7 g/dL (ref 3.5–5.2)
Alkaline Phosphatase: 45 U/L (ref 39–117)
BILIRUBIN TOTAL: 0.3 mg/dL (ref 0.2–1.2)
BUN: 17 mg/dL (ref 6–23)
CALCIUM: 8.7 mg/dL (ref 8.4–10.5)
CHLORIDE: 106 meq/L (ref 96–112)
CO2: 30 meq/L (ref 19–32)
Creatinine, Ser: 0.83 mg/dL (ref 0.40–1.50)
GFR: 96.57 mL/min (ref >60.00)
GLUCOSE: 101 mg/dL — AB (ref 70–99)
Potassium: 4.3 mEq/L (ref 3.5–5.1)
SODIUM: 139 meq/L (ref 135–145)
Total Protein: 6.6 g/dL (ref 6.0–8.3)

## 2014-04-09 LAB — HEMOGLOBIN A1C: Hgb A1c MFr Bld: 6.3 % (ref 4.6–6.5)

## 2014-04-16 ENCOUNTER — Ambulatory Visit (INDEPENDENT_AMBULATORY_CARE_PROVIDER_SITE_OTHER): Payer: Medicare Other | Admitting: Family Medicine

## 2014-04-16 ENCOUNTER — Encounter: Payer: Self-pay | Admitting: Family Medicine

## 2014-04-16 VITALS — BP 143/59 | HR 56 | Temp 98.2°F | Ht 66.0 in | Wt 135.5 lb

## 2014-04-16 DIAGNOSIS — R911 Solitary pulmonary nodule: Secondary | ICD-10-CM | POA: Diagnosis not present

## 2014-04-16 DIAGNOSIS — Z0000 Encounter for general adult medical examination without abnormal findings: Secondary | ICD-10-CM | POA: Diagnosis not present

## 2014-04-16 DIAGNOSIS — R7309 Other abnormal glucose: Secondary | ICD-10-CM

## 2014-04-16 DIAGNOSIS — Z23 Encounter for immunization: Secondary | ICD-10-CM

## 2014-04-16 DIAGNOSIS — Z7189 Other specified counseling: Secondary | ICD-10-CM | POA: Diagnosis not present

## 2014-04-16 DIAGNOSIS — J449 Chronic obstructive pulmonary disease, unspecified: Secondary | ICD-10-CM

## 2014-04-16 DIAGNOSIS — R7303 Prediabetes: Secondary | ICD-10-CM

## 2014-04-16 DIAGNOSIS — E785 Hyperlipidemia, unspecified: Secondary | ICD-10-CM | POA: Diagnosis not present

## 2014-04-16 DIAGNOSIS — I10 Essential (primary) hypertension: Secondary | ICD-10-CM

## 2014-04-16 NOTE — Assessment & Plan Note (Signed)
improved  With smoking cessation

## 2014-04-16 NOTE — Addendum Note (Signed)
Addended by: Carter Kitten on: 04/16/2014 03:20 PM   Modules accepted: Orders

## 2014-04-16 NOTE — Assessment & Plan Note (Signed)
Excellent control on vytorin

## 2014-04-16 NOTE — Patient Instructions (Addendum)
Stop at front desk to set up CT follow up pulmonary nodule.  Keep working on healthy eating and regular exercise as able.

## 2014-04-16 NOTE — Progress Notes (Signed)
HPI  I have personally reviewed the Medicare Annual Wellness questionnaire and have noted  1. The patient's medical and social history  2. Their use of alcohol, tobacco or illicit drugs  3. Their current medications and supplements  4. The patient's functional ability including ADL's, fall risks, home safety risks and hearing or visual  impairment.  5. Diet and physical activities  6. Evidence for depression or mood disorders  The patients weight, height, BMI and visual acuity have been recorded in the chart  I have made referrals, counseling and provided education to the patient based review of the above and I have provided the pt with a written personalized care plan for preventive services.   Elevated Cholesterol:On Vytorin.. At Goal LDL<70  Lab Results  Component Value Date   CHOL 99 04/09/2014   HDL 44.50 04/09/2014   LDLCALC 37 04/09/2014   LDLDIRECT 176.9 05/09/2008   TRIG 90.0 04/09/2014   CHOLHDL 2 04/09/2014  Using medications without problems:None  Muscle aches: None  Diet compliance: Fair  Exercise:working in yard. Walking dogs. Other complaints:  B carotid stenosis, claudication....followed by Vascular MD, last carotid was 11/2013 stable  AAA: last eval 12/2013, has follow up in 6 months  CAD, CHF followed by cards.Claiborne Billings S/ P cath 2015. cardiac rehab.  BP Well Borderline controlled. On bisoprolol, furosemide, lisinopril BP Readings from Last 3 Encounters:  04/16/14 143/59  12/18/13 141/60  12/12/13 159/62   BPH: stable control on flomax.  Prediabetes:Stable control, almost resolved with lifestyle changes.  Lab Results  Component Value Date   HGBA1C 6.3 04/09/2014    COPD: QUIT  09/09/2013, only did it off and on. Feels like breathing better.No acute bronchitis issues, no cough. No wheeze. Has smoked 43 years... 5 mm right upper lobe pulmonary nodule on CT in 09/10/2013.  Needs repeat.  No exercise, moderate diet. Finished cardiac  rehab.  Review of Systems  Constitutional: Negative for fever, fatigue and unexpected weight change.  HENT: Negative for ear pain, congestion, sore throat, rhinorrhea, trouble swallowing and postnasal drip.  Eyes: Negative for pain.  Respiratory: Negative for cough, shortness of breath and wheezing.  Cardiovascular: Negative for chest pain, palpitations and leg swelling.  Gastrointestinal: Negative for nausea, abdominal pain, diarrhea, constipation and blood in stool.  Genitourinary: Negative for dysuria, urgency, hematuria, discharge, penile swelling, scrotal swelling, difficulty urinating, penile pain and testicular pain.  Skin: Negative for rash.  Neurological: Negative for syncope, weakness, light-headedness, numbness and headaches.  Psychiatric/Behavioral: Negative for behavioral problems and dysphoric mood. The patient is not nervous/anxious.   History   Social History  . Marital Status: Married    Spouse Name: N/A    Number of Children: N/A  . Years of Education: N/A   Occupational History  . Not on file.   Social History Main Topics  . Smoking status: Current Every Day Smoker -- 1.5 packs/day for .5 years    Types: Cigarettes  . Smokeless tobacco: Not on file  . Alcohol Use: No  . Drug Use: No  . Sexually Active:    Other Topics Concern  . Not on file   Social History Narrative   No living will, no HCPOA. (reviewed 2013)    Objective:   Physical Exam  Constitutional: He appears well-developed and well-nourished. Non-toxic appearance. He does not appear ill. No distress.  HENT:  Head: Normocephalic and atraumatic.  Right Ear: Hearing, tympanic membrane, external ear and ear canal normal.  Left Ear: Hearing, tympanic membrane, external ear  and ear canal normal.  Nose: Nose normal.  Mouth/Throat: Uvula is midline, oropharynx is clear and moist and mucous membranes are normal.  Cerumen  impaction B.. B TMs irrigated.  Eyes: Conjunctivae, EOM and lids are normal. Pupils are equal, round, and reactive to light. No foreign bodies found.  Neck: Trachea normal, normal range of motion and phonation normal. Neck supple. Carotid bruit is not present. No mass and no thyromegaly present.  Cardiovascular: Normal rate, regular rhythm, S1 normal, S2 normal, intact distal pulses and normal pulses. Exam reveals no gallop.  No murmur heard.  Pulmonary/Chest: Breath sounds normal. He has no wheezes. He has no rhonchi. He has no rales.  Abdominal: Soft. Normal appearance and bowel sounds are normal. There is no hepatosplenomegaly. There is no tenderness. There is no rebound, no guarding and no CVA tenderness. No hernia. Hernia confirmed negative in the right inguinal area and confirmed negative in the left inguinal area.  Genitourinary: Testes normal and penis normal. Rectal exam shows no external hemorrhoid, no internal hemorrhoid, no fissure, no mass, no tenderness and anal tone normal. Guaiac negative stool. Prostate is enlarged. Prostate is not tender. Right testis shows no mass and no tenderness. Left testis shows no mass and no tenderness. No paraphimosis or penile tenderness.  Lymphadenopathy:  He has no cervical adenopathy.  Right: No inguinal adenopathy present.  Left: No inguinal adenopathy present.  Neurological: He is alert. He has normal strength and normal reflexes. No cranial nerve deficit or sensory deficit. Gait normal.  Skin: Skin is warm, dry and intact. No rash noted.  Psychiatric: He has a normal mood and affect. His speech is normal and behavior is normal. Judgment normal.    Assessment & Plan:   AMW: The patient's preventative maintenance and recommended screening tests for an annual wellness exam were reviewed in full today.  Brought up to date unless services declined.  Counselled on the importance of diet, exercise, and its role in overall health and  mortality.  The patient's FH and SH was reviewed, including their home life, tobacco status, and drug and alcohol status.   Td, PNA: Up to date,  Will get prevnar today Shingles:not interested  Flu: not interested  PSA: Hx of BPH on tamulosin. Colon: 06/2008 Dr. Sharlett Iles, repeat 10 years hyperplastic polyps  Smoker: spirometry performed 2012.. Breathing better since quit smoking. Will have CT scan for pulm nodule  Now.

## 2014-04-16 NOTE — Assessment & Plan Note (Signed)
Stable control. 

## 2014-04-16 NOTE — Assessment & Plan Note (Signed)
Well controlled. Continue current medication.  

## 2014-04-16 NOTE — Progress Notes (Signed)
Pre visit review using our clinic review tool, if applicable. No additional management support is needed unless otherwise documented below in the visit note. 

## 2014-04-16 NOTE — Assessment & Plan Note (Signed)
5 mm right upper lobe pulmonary nodule on 09/2013 CT.Marland Kitchen Needs 6-12 month repeat given high risk

## 2014-04-17 ENCOUNTER — Telehealth: Payer: Self-pay | Admitting: Family Medicine

## 2014-04-17 NOTE — Telephone Encounter (Signed)
emmi emailed °

## 2014-04-22 ENCOUNTER — Ambulatory Visit (INDEPENDENT_AMBULATORY_CARE_PROVIDER_SITE_OTHER)
Admission: RE | Admit: 2014-04-22 | Discharge: 2014-04-22 | Disposition: A | Discharge status: Home / Self Care | Payer: Medicare Other | Source: Ambulatory Visit | Attending: Family Medicine | Admitting: Family Medicine

## 2014-04-22 DIAGNOSIS — R911 Solitary pulmonary nodule: Secondary | ICD-10-CM

## 2014-04-22 DIAGNOSIS — R918 Other nonspecific abnormal finding of lung field: Secondary | ICD-10-CM | POA: Diagnosis not present

## 2014-04-22 MED ORDER — IOHEXOL 300 MG/ML  SOLN
80.0000 mL | Freq: Once | INTRAMUSCULAR | Status: AC | PRN
  Administered 2014-04-22: 80 mL via INTRAVENOUS

## 2014-05-21 ENCOUNTER — Other Ambulatory Visit (HOSPITAL_COMMUNITY): Payer: Self-pay | Admitting: Adult Health

## 2014-05-21 ENCOUNTER — Other Ambulatory Visit (HOSPITAL_COMMUNITY): Payer: Self-pay | Admitting: Cardiology

## 2014-06-18 ENCOUNTER — Other Ambulatory Visit: Payer: Self-pay | Admitting: *Deleted

## 2014-06-18 MED ORDER — EZETIMIBE-SIMVASTATIN 10-40 MG PO TABS: 1.0000 | ORAL_TABLET | Freq: Every day | ORAL | Status: DC

## 2014-06-18 MED ORDER — TAMSULOSIN HCL 0.4 MG PO CAPS: 0.4000 mg | ORAL_CAPSULE | Freq: Every day | ORAL | Status: DC

## 2014-06-18 NOTE — Telephone Encounter (Signed)
Patient's wife left a voicemail that they would like a refill sent to [facility] on his Tamsulosin and Vytorin. Called and spoke to patient and was advised that he would like a 30 day supply at a time. Advised patient that scripts will be sent to pharmacy electronically.

## 2014-06-19 ENCOUNTER — Other Ambulatory Visit (HOSPITAL_COMMUNITY): Payer: Medicare Other

## 2014-06-19 ENCOUNTER — Ambulatory Visit: Payer: Medicare Other | Admitting: Family

## 2014-07-03 ENCOUNTER — Encounter: Payer: Self-pay | Admitting: Family

## 2014-07-04 ENCOUNTER — Ambulatory Visit (INDEPENDENT_AMBULATORY_CARE_PROVIDER_SITE_OTHER): Payer: Medicare Other | Admitting: Family

## 2014-07-04 ENCOUNTER — Encounter: Payer: Self-pay | Admitting: Family

## 2014-07-04 ENCOUNTER — Ambulatory Visit (HOSPITAL_COMMUNITY)
Admission: RE | Admit: 2014-07-04 | Discharge: 2014-07-04 | Disposition: A | Discharge status: Home / Self Care | Payer: Medicare Other | Source: Ambulatory Visit | Attending: Family | Admitting: Family

## 2014-07-04 VITALS — BP 115/58 | HR 53 | Resp 16 | Ht 67.0 in | Wt 132.0 lb

## 2014-07-04 DIAGNOSIS — I714 Abdominal aortic aneurysm, without rupture, unspecified: Secondary | ICD-10-CM

## 2014-07-04 DIAGNOSIS — Z87891 Personal history of nicotine dependence: Secondary | ICD-10-CM | POA: Insufficient documentation

## 2014-07-04 DIAGNOSIS — Z9889 Other specified postprocedural states: Secondary | ICD-10-CM | POA: Insufficient documentation

## 2014-07-04 DIAGNOSIS — I771 Stricture of artery: Secondary | ICD-10-CM

## 2014-07-04 NOTE — Progress Notes (Signed)
[facility] [place]  Established Abdominal Aortic Aneurysm  History of Present Illness  Erik [name] is a 74 y.o. ([birth year]) male  patient of Dr. Scot Dock who is s/p bilateral carotid endarterectomies in February and March of 2010.  He returns today for follow up AAA Duplex for AAA diagnosed October 2015 after prominent aortic pulse detected. Patient has Negative history of TIA or stroke symptom. The patient denies amaurosis fugax or monocular blindness. The patient denies facial drooping.  Pt. denies hemiplegia. The patient denies receptive or expressive aphasia.  Patient denies abdominal or back pain, his brother has a AAA also.  Pt reports New Medical or Surgical History: cardiac stents placed July, 2015, states he may have had an MI. He has bilateral hip pain and thigh pain after walking about 200 yards which resolves with rest; has had this for several years with no worsening, no improvement. He has no known hip or low back problems.   Pt Diabetic: No  Pt smoker: former smoker, quit June, 2015   Pt meds include:  Statin : Yes  ASA: Yes  Other anticoagulants/antiplatelets: Brilinta prescribed by his cardiologist, Dr. Aundra Dubin, for CAD  Past Medical History  Diagnosis Date  . Coronary artery disease     a. LHC (08/2013): Lmain: short 30% distal, LAD: sml D1 & D2, 70% ostial D1, 95-99% LAD stenosis prox D2 LCx: sml/mod ramus subtot. occluded, 40% ostial set off lg OM1, 40% AV LCx after OM1, RCA: 90% prox (DES to RCA and prox LAD)  . Hyperlipidemia   . COPD (chronic obstructive pulmonary disease)   . BPH (benign prostatic hypertrophy)   . History of colonic polyps   . Hypertension   . Heart murmur   . Collagen vascular disease   . Chronic systolic heart failure     a. EF 20-25%, mild LVH, mod HK, mid apicalanteroseptal myocardium, mild MR, LA mod dilated  . Ischemic cardiomyopathy   . Carotid artery stenosis     a. Bilateral CEA   Past Surgical  History  Procedure Laterality Date  . Cholecystectomy      Gall Bladder  . Hernia repair    . Carotid endarterectomy  04/17/2008    right  . Carotid endarterectomy  05/30/08    Left  . Cardiac stents  09-2013  . Percutaneous stent intervention  09/11/2013    Procedure: PERCUTANEOUS STENT INTERVENTION;  Surgeon: Troy Sine, MD;  Location: [facility] CATH LAB;  Service: Cardiovascular;;  DES Prox RCA 3.5x15 xience DES Prox LAD 3.0x33 xience  . Coronary angiogram  09/11/2013    Procedure: CORONARY ANGIOGRAM;  Surgeon: Troy Sine, MD;  Location: [facility] CATH LAB;  Service: Cardiovascular;;   Social History History   Social History  . Marital Status: Married    Spouse Name: N/A  . Number of Children: N/A  . Years of Education: N/A   Occupational History  . Not on file.   Social History Main Topics  . Smoking status: Former Smoker -- 1.50 packs/day for .5 years    Types: Cigarettes    Quit date: 08/27/2013  . Smokeless tobacco: Former Systems developer    Quit date: 09/10/2013  . Alcohol Use: No  . Drug Use: No  . Sexual Activity: Not on file   Other Topics Concern  . Not on file   Social History Narrative   Lives with wife in [place]. Retired from the post [employer]  Family History Family History  Problem Relation Age of Onset  . Heart disease Brother   . Hyperlipidemia Brother   . Hypertension Mother   . Cancer Father   . Hyperlipidemia Brother   . Hyperlipidemia Brother   . Heart attack Brother   . Hyperlipidemia Brother   . Multiple sclerosis Brother     Current Outpatient Prescriptions on File Prior to Visit  Medication Sig Dispense Refill  . aspirin 81 MG tablet Take 81 mg by mouth daily.      . bisoprolol (ZEBETA) 5 MG tablet Take 1.5 tablets (7.5 mg total) by mouth daily. 30 tablet 6  . ezetimibe-simvastatin (VYTORIN) 10-40 MG per tablet Take 1 tablet by mouth at bedtime. 30 tablet 9  . fish oil-omega-3 fatty acids 1000 MG capsule Take 1 g by mouth  daily.      . furosemide (LASIX) 20 MG tablet TAKE 1 TABLET (20 MG TOTAL) BY MOUTH DAILY. 30 tablet 2  . ibuprofen (ADVIL,MOTRIN) 200 MG tablet Take 200 mg by mouth as needed for pain.    Marland Kitchen lisinopril (PRINIVIL,ZESTRIL) 5 MG tablet TAKE 1 TABLET (5 MG TOTAL) BY MOUTH DAILY. 30 tablet 0  . nitroGLYCERIN (NITROSTAT) 0.4 MG SL tablet Place 1 tablet (0.4 mg total) under the tongue every 5 (five) minutes x 3 doses as needed for chest pain. 30 tablet 12  . tamsulosin (FLOMAX) 0.4 MG CAPS capsule Take 1 capsule (0.4 mg total) by mouth daily. 30 capsule 9  . ticagrelor (BRILINTA) 90 MG TABS tablet Take 1 tablet (90 mg total) by mouth 2 (two) times daily. 180 tablet 6   No current facility-administered medications on file prior to visit.   Allergies  Allergen Reactions  . Codeine Swelling    REACTION: Throat swells    ROS: See HPI for pertinent positives and negatives.  Physical Examination  Filed Vitals:   07/04/14 0911  BP: 115/58  Pulse: 53  Resp: 16  Height: 5\' 7"  (1.702 m)  Weight: 132 lb (59.875 kg)  SpO2: 100%   Body mass index is 20.67 kg/(m^2).  General: WDWN male in NAD  GAIT: normal  Eyes: PERRLA  Pulmonary: Non-labored, CTAB, Negative Rales, Negative rhonchi, & Negative wheezing.  Cardiac: regular Rhythm , Positive detected murmur.   VASCULAR EXAM  Carotid Bruits  Right  Left    negative  Positive   Aorta is palpable.  Radial pulses: right is 2+ palpable, left is 1+ palpable.  LE Pulses  Right  Left   FEMORAL  2+ palpable  2+ palpable   POPLITEAL  not palpable  not palpable   POSTERIOR TIBIAL  2+ palpable  2+ palpable   DORSALIS PEDIS  ANTERIOR TIBIAL  not palpable  not palpable    Gastrointestinal: soft, nontender, BS WNL, no r/g, no palpable masses other than the AAA.  Musculoskeletal: Negative muscle atrophy/wasting. M/S 5/5 throughout, Extremities without ischemic changes.  Neurologic: A&O X 3; Appropriate Affect ; SENSATION  ;normal;  Speech is normal  CN 2-12 intact, Pain and light touch intact in extremities, Motor exam as listed above.          Non-Invasive Vascular Imaging  AAA Duplex (07/04/2014) ABDOMINAL AORTA DUPLEX EVALUATION    INDICATION: Follow-up abdominal aortic aneurysm     PREVIOUS INTERVENTION(S):     DUPLEX EXAM:     LOCATION DIAMETER AP (cm) DIAMETER TRANSVERSE (cm) VELOCITIES (cm/sec)  Aorta Proximal 2.31 2.31 88  Aorta Mid 4.92 4.92 75  Aorta Distal 2.5 2.5  48  Right Common Iliac Artery 1.06 1.08 646  Left Common Iliac Artery 1.06 1.07 462    Previous max aortic diameter:  4.84cm Date: 12/18/2013  ADDITIONAL FINDINGS:     IMPRESSION: 1. Evidence of abdominal aortic aneurysm measuring 4.92cm on today's exam. 2. Significant (>50%) calcific stenosis is observed in the bilateral ostial common iliac artery.    Compared to the previous exam:  Slight increase in abdominal aortic aneurysm diameter. Iliac disease was not previously reported.     Medical Decision Making  The patient is a 74 y.o. male who presents with asymptomatic AAA with slight increase in size.  Newly diagnosed significant bilateral iliac artery stenosis; his mild/moderate bilateral hip and thigh pain with walking may be related, this has not worsened, he does not walk much and has no barrier to walking other than the above pain after walking about 200 yards.  Graduated walking program advised.  Face to face time with patient was 25 minutes. Over 50% of this time was spent on counseling and coordination of care.    Based on this patient's exam and diagnostic studies, the patient will follow up in 6 months  with the following studies: AAA Duplex, carotid Duplex already scheduled.  Consideration for repair of AAA would be made when the size is 5.5 cm, growth > 1 cm/yr, and symptomatic status.  I emphasized the importance of maximal medical management including strict control of blood pressure, blood  glucose, and lipid levels, antiplatelet agents, obtaining regular exercise, and continued cessation of smoking.   The patient was given information about AAA including signs, symptoms, treatment, and how to minimize the risk of enlargement and rupture of aneurysms.    The patient was advised to call 911 should the patient experience sudden onset abdominal or back pain.   Thank you for allowing [place] to participate in this patient's care.  Clemon Chambers, RN, MSN, FNP-C [facility] [place] Office: [facility] Physician: Oneida Alar  07/04/2014, 9:18 AM

## 2014-07-04 NOTE — Patient Instructions (Addendum)
Abdominal Aortic Aneurysm An aneurysm is a weakened or damaged part of an artery wall that bulges from the normal force of blood pumping through the body. An abdominal aortic aneurysm is an aneurysm that occurs in the lower part of the aorta, the main artery of the body.  The major concern with an abdominal aortic aneurysm is that it can enlarge and burst (rupture) or blood can flow between the layers of the wall of the aorta through a tear (aorticdissection). Both of these conditions can cause bleeding inside the body and can be life threatening unless diagnosed and treated promptly. CAUSES  The exact cause of an abdominal aortic aneurysm is unknown. Some contributing factors are:   A hardening of the arteries caused by the buildup of fat and other substances in the lining of a blood vessel (arteriosclerosis).  Inflammation of the walls of an artery (arteritis).   Connective tissue diseases, such as Marfan syndrome.   Abdominal trauma.   An infection, such as syphilis or staphylococcus, in the wall of the aorta (infectious aortitis) caused by bacteria. RISK FACTORS  Risk factors that contribute to an abdominal aortic aneurysm may include:  Age older than 60 years.   High blood pressure (hypertension).  Male gender.  Ethnicity (white race).  Obesity.  Family history of aneurysm (first degree relatives only).  Tobacco use. PREVENTION  The following healthy lifestyle habits may help decrease your risk of abdominal aortic aneurysm:  Quitting smoking. Smoking can raise your blood pressure and cause arteriosclerosis.  Limiting or avoiding alcohol.  Keeping your blood pressure, blood sugar level, and cholesterol levels within normal limits.  Decreasing your salt intake. In somepeople, too much salt can raise blood pressure and increase your risk of abdominal aortic aneurysm.  Eating a diet low in saturated fats and cholesterol.  Increasing your fiber intake by including  whole grains, vegetables, and fruits in your diet. Eating these foods may help lower blood pressure.  Maintaining a healthy weight.  Staying physically active and exercising regularly. SYMPTOMS  The symptoms of abdominal aortic aneurysm may vary depending on the size and rate of growth of the aneurysm.Most grow slowly and do not have any symptoms. When symptoms do occur, they may include:  Pain (abdomen, side, lower back, or groin). The pain may vary in intensity. A sudden onset of severe pain may indicate that the aneurysm has ruptured.  Feeling full after eating only small amounts of food.  Nausea or vomiting or both.  Feeling a pulsating lump in the abdomen.  Feeling faint or passing out. DIAGNOSIS  Since most unruptured abdominal aortic aneurysms have no symptoms, they are often discovered during diagnostic exams for other conditions. An aneurysm may be found during the following procedures:  Ultrasonography (A one-time screening for abdominal aortic aneurysm by ultrasonography is also recommended for all men aged 65-75 years who have ever smoked).  X-ray exams.  A computed tomography (CT).  Magnetic resonance imaging (MRI).  Angiography or arteriography. TREATMENT  Treatment of an abdominal aortic aneurysm depends on the size of your aneurysm, your age, and risk factors for rupture. Medication to control blood pressure and pain may be used to manage aneurysms smaller than 6 cm. Regular monitoring for enlargement may be recommended by your caregiver if:  The aneurysm is 3-4 cm in size (an annual ultrasonography may be recommended).  The aneurysm is 4-4.5 cm in size (an ultrasonography every 6 months may be recommended).  The aneurysm is larger than 4.5 cm in   size (your caregiver may ask that you be examined by a vascular surgeon). If your aneurysm is larger than 6 cm, surgical repair may be recommended. There are two main methods for repair of an aneurysm:   Endovascular  repair (a minimally invasive surgery). This is done most often.  Open repair. This method is used if an endovascular repair is not possible. Document Released: 12/09/2004 Document Revised: 06/26/2012 Document Reviewed: 03/31/2012 ExitCare Patient Information 2015 [employer]. This information is not intended to replace advice given to you by your health care provider. Make sure you discuss any questions you have with your health care provider.    Stroke Prevention Some medical conditions and behaviors are associated with an increased chance of having a stroke. You may prevent a stroke by making healthy choices and managing medical conditions. HOW CAN I REDUCE MY RISK OF HAVING A STROKE?   Stay physically active. Get at least 30 minutes of activity on most or all days.  Do not smoke. It may also be helpful to avoid exposure to secondhand smoke.  Limit alcohol use. Moderate alcohol use is considered to be:  No more than 2 drinks per day for men.  No more than 1 drink per day for nonpregnant women.  Eat healthy foods. This involves:  Eating 5 or more servings of fruits and vegetables a day.  Making dietary changes that address high blood pressure (hypertension), high cholesterol, diabetes, or obesity.  Manage your cholesterol levels.  Making food choices that are high in fiber and low in saturated fat, trans fat, and cholesterol may control cholesterol levels.  Take any prescribed medicines to control cholesterol as directed by your health care provider.  Manage your diabetes.  Controlling your carbohydrate and sugar intake is recommended to manage diabetes.  Take any prescribed medicines to control diabetes as directed by your health care provider.  Control your hypertension.  Making food choices that are low in salt (sodium), saturated fat, trans fat, and cholesterol is recommended to manage hypertension.  Take any prescribed medicines to control hypertension as  directed by your health care provider.  Maintain a healthy weight.  Reducing calorie intake and making food choices that are low in sodium, saturated fat, trans fat, and cholesterol are recommended to manage weight.  Stop drug abuse.  Avoid taking birth control pills.  Talk to your health care provider about the risks of taking birth control pills if you are over 35 years old, smoke, get migraines, or have ever had a blood clot.  Get evaluated for sleep disorders (sleep apnea).  Talk to your health care provider about getting a sleep evaluation if you snore a lot or have excessive sleepiness.  Take medicines only as directed by your health care provider.  For some people, aspirin or blood thinners (anticoagulants) are helpful in reducing the risk of forming abnormal blood clots that can lead to stroke. If you have the irregular heart rhythm of atrial fibrillation, you should be on a blood thinner unless there is a good reason you cannot take them.  Understand all your medicine instructions.  Make sure that other conditions (such as anemia or atherosclerosis) are addressed. SEEK IMMEDIATE MEDICAL CARE IF:   You have sudden weakness or numbness of the face, arm, or leg, especially on one side of the body.  Your face or eyelid droops to one side.  You have sudden confusion.  You have trouble speaking (aphasia) or understanding.  You have sudden trouble seeing in one   or both eyes.  You have sudden trouble walking.  You have dizziness.  You have a loss of balance or coordination.  You have a sudden, severe headache with no known cause.  You have new chest pain or an irregular heartbeat. Any of these symptoms may represent a serious problem that is an emergency. Do not wait to see if the symptoms will go away. Get medical help at once. Call your local emergency services (911 in [place] Do not drive yourself to the hospital. Document Released: 04/08/2004 Document Revised:  07/16/2013 Document Reviewed: 09/01/2012 ExitCare Patient Information 2015 [employer]. This information is not intended to replace advice given to you by your health care provider. Make sure you discuss any questions you have with your health care provider.    Peripheral Vascular Disease Peripheral Vascular Disease (PVD), also called Peripheral Arterial Disease (PAD), is a circulation problem caused by cholesterol (atherosclerotic plaque) deposits in the arteries. PVD commonly occurs in the lower extremities (legs) but it can occur in other areas of the body, such as your arms. The cholesterol buildup in the arteries reduces blood flow which can cause pain and other serious problems. The presence of PVD can place a person at risk for Coronary Artery Disease (CAD).  CAUSES  Causes of PVD can be many. It is usually associated with more than one risk factor such as:   High Cholesterol.  Smoking.  Diabetes.  Lack of exercise or inactivity.  High blood pressure (hypertension).  Obesity.  Family history. SYMPTOMS   When the lower extremities are affected, patients with PVD may experience:  Leg pain with exertion or physical activity. This is called INTERMITTENT CLAUDICATION. This may present as cramping or numbness with physical activity. The location of the pain is associated with the level of blockage. For example, blockage at the abdominal level (distal abdominal aorta) may result in buttock or hip pain. Lower leg arterial blockage may result in calf pain.  As PVD becomes more severe, pain can develop with less physical activity.  In people with severe PVD, leg pain may occur at rest.  Other PVD signs and symptoms:  Leg numbness or weakness.  Coldness in the affected leg or foot, especially when compared to the other leg.  A change in leg color.  Patients with significant PVD are more prone to ulcers or sores on toes, feet or legs. These may take longer to heal or may  reoccur. The ulcers or sores can become infected.  If signs and symptoms of PVD are ignored, gangrene may occur. This can result in the loss of toes or loss of an entire limb.  Not all leg pain is related to PVD. Other medical conditions can cause leg pain such as:  Blood clots (embolism) or Deep Vein Thrombosis.  Inflammation of the blood vessels (vasculitis).  Spinal stenosis. DIAGNOSIS  Diagnosis of PVD can involve several different types of tests. These can include:  Pulse Volume Recording Method (PVR). This test is simple, painless and does not involve the use of X-rays. PVR involves measuring and comparing the blood pressure in the arms and legs. An ABI (Ankle-Brachial Index) is calculated. The normal ratio of blood pressures is 1. As this number becomes smaller, it indicates more severe disease.  < 0.95 - indicates significant narrowing in one or more leg vessels.  <0.8 - there will usually be pain in the foot, leg or buttock with exercise.  <0.4 - will usually have pain in the legs at rest.  <  0.25 - usually indicates limb threatening PVD.  Doppler detection of pulses in the legs. This test is painless and checks to see if you have a pulses in your legs/feet.  A dye or contrast material (a substance that highlights the blood vessels so they show up on x-ray) may be given to help your caregiver better see the arteries for the following tests. The dye is eliminated from your body by the kidney's. Your caregiver may order blood work to check your kidney function and other laboratory values before the following tests are performed:  Magnetic Resonance Angiography (MRA). An MRA is a picture study of the blood vessels and arteries. The MRA machine uses a large magnet to produce images of the blood vessels.  Computed Tomography Angiography (CTA). A CTA is a specialized x-ray that looks at how the blood flows in your blood vessels. An IV may be inserted into your arm so contrast dye can  be injected.  Angiogram. Is a procedure that uses x-rays to look at your blood vessels. This procedure is minimally invasive, meaning a small incision (cut) is made in your groin. A small tube (catheter) is then inserted into the artery of your groin. The catheter is guided to the blood vessel or artery your caregiver wants to examine. Contrast dye is injected into the catheter. X-rays are then taken of the blood vessel or artery. After the images are obtained, the catheter is taken out. TREATMENT  Treatment of PVD involves many interventions which may include:  Lifestyle changes:  Quitting smoking.  Exercise.  Following a low fat, low cholesterol diet.  Control of diabetes.  Foot care is very important to the PVD patient. Good foot care can help prevent infection.  Medication:  Cholesterol-lowering medicine.  Blood pressure medicine.  Anti-platelet drugs.  Certain medicines may reduce symptoms of Intermittent Claudication.  Interventional/Surgical options:  Angioplasty. An Angioplasty is a procedure that inflates a balloon in the blocked artery. This opens the blocked artery to improve blood flow.  Stent Implant. A wire mesh tube (stent) is placed in the artery. The stent expands and stays in place, allowing the artery to remain open.  Peripheral Bypass Surgery. This is a surgical procedure that reroutes the blood around a blocked artery to help improve blood flow. This type of procedure may be performed if Angioplasty or stent implants are not an option. SEEK IMMEDIATE MEDICAL CARE IF:   You develop pain or numbness in your arms or legs.  Your arm or leg turns cold, becomes blue in color.  You develop redness, warmth, swelling and pain in your arms or legs. MAKE SURE YOU:   Understand these instructions.  Will watch your condition.  Will get help right away if you are not doing well or get worse. Document Released: 04/08/2004 Document Revised: 05/24/2011 Document  Reviewed: 03/05/2008 ExitCare Patient Information 2015 [employer]. This information is not intended to replace advice given to you by your health care provider. Make sure you discuss any questions you have with your health care provider.  

## 2014-07-08 NOTE — Addendum Note (Signed)
Addended by: Mena Goes on: 07/08/2014 03:25 PM   Modules accepted: Orders

## 2014-07-08 NOTE — Addendum Note (Signed)
Addended by: Mena Goes on: 07/08/2014 03:30 PM   Modules accepted: Orders

## 2014-08-12 ENCOUNTER — Other Ambulatory Visit (HOSPITAL_COMMUNITY): Payer: Self-pay | Admitting: Adult Health

## 2014-08-13 ENCOUNTER — Other Ambulatory Visit (HOSPITAL_COMMUNITY): Payer: Self-pay | Admitting: *Deleted

## 2014-08-19 ENCOUNTER — Encounter: Payer: Self-pay | Admitting: Cardiology

## 2014-09-13 ENCOUNTER — Other Ambulatory Visit (HOSPITAL_COMMUNITY): Payer: Self-pay | Admitting: Cardiology

## 2014-09-21 ENCOUNTER — Telehealth: Payer: Self-pay | Admitting: Cardiology

## 2014-09-23 ENCOUNTER — Other Ambulatory Visit: Payer: Self-pay | Admitting: *Deleted

## 2014-09-23 ENCOUNTER — Other Ambulatory Visit (HOSPITAL_COMMUNITY): Payer: Self-pay | Admitting: *Deleted

## 2014-09-23 MED ORDER — LISINOPRIL 5 MG PO TABS: 5.0000 mg | ORAL_TABLET | Freq: Every day | ORAL | Status: DC

## 2014-09-23 MED ORDER — LISINOPRIL 5 MG PO TABS: ORAL_TABLET | ORAL | Status: DC

## 2014-09-27 NOTE — Telephone Encounter (Signed)
No new encounter noted added

## 2014-09-27 NOTE — Telephone Encounter (Signed)
Medications need to be re-ordered.  Note under medication says no more refills until makes appointment

## 2014-10-14 ENCOUNTER — Telehealth: Payer: Self-pay

## 2014-10-14 ENCOUNTER — Other Ambulatory Visit (HOSPITAL_COMMUNITY): Payer: Self-pay | Admitting: Cardiology

## 2014-10-14 DIAGNOSIS — I5022 Chronic systolic (congestive) heart failure: Secondary | ICD-10-CM

## 2014-10-14 MED ORDER — LISINOPRIL 5 MG PO TABS: 2.5000 mg | ORAL_TABLET | Freq: Every day | ORAL | Status: DC

## 2014-10-14 MED ORDER — BISOPROLOL FUMARATE 5 MG PO TABS: 5.0000 mg | ORAL_TABLET | Freq: Every day | ORAL | Status: DC

## 2014-10-14 NOTE — Telephone Encounter (Signed)
Pt/pts wife aware and voiced understanding

## 2014-10-14 NOTE — Telephone Encounter (Signed)
Patient wife concerned that B/P is averaging 115/58 P 53.  It is time to fill both lisinopril and Bisoprolol and isn't sure if possibly the doctor would like to change his medications.  Wife said that he is dizzy and worn out.  One month ago patient on his own  stopped taking 1 1/2 Bisoprolol 5 mg because his blood pressure was 99/43.  I told her that I would send message to Dr. Aundra Dubin to see if he would like to do anything different in the plan of care

## 2014-10-14 NOTE — Addendum Note (Signed)
Addended by: Markham Dumlao, Sharlot Gowda on: 10/14/2014 04:02 PM   Modules accepted: Orders

## 2014-10-14 NOTE — Telephone Encounter (Signed)
Generally that is not a bad blood pressure.  If he gets lightheaded, can decrease lisinopril to 2.5 mg daily and take in evening.

## 2014-10-14 NOTE — Telephone Encounter (Signed)
Patient's wife called for refill of med.  However, she would like to speak with a nurse concerning the patient's low BP, averages 115/50.  She thinks it may need to be changed.  Bisoprolol 5 mg  and Lisinopril 5 mg.

## 2014-10-15 NOTE — Telephone Encounter (Signed)
error 

## 2014-10-31 ENCOUNTER — Other Ambulatory Visit (HOSPITAL_COMMUNITY): Payer: Self-pay | Admitting: Internal Medicine

## 2014-11-07 ENCOUNTER — Other Ambulatory Visit (HOSPITAL_COMMUNITY): Payer: Self-pay | Admitting: Internal Medicine

## 2014-11-08 ENCOUNTER — Other Ambulatory Visit (HOSPITAL_COMMUNITY): Payer: Self-pay | Admitting: Internal Medicine

## 2014-11-11 ENCOUNTER — Other Ambulatory Visit: Payer: Self-pay | Admitting: *Deleted

## 2014-11-11 ENCOUNTER — Other Ambulatory Visit (HOSPITAL_COMMUNITY): Payer: Self-pay | Admitting: *Deleted

## 2014-11-11 DIAGNOSIS — I5022 Chronic systolic (congestive) heart failure: Secondary | ICD-10-CM

## 2014-11-11 MED ORDER — TAMSULOSIN HCL 0.4 MG PO CAPS: 0.4000 mg | ORAL_CAPSULE | Freq: Every day | ORAL | Status: DC

## 2014-11-11 MED ORDER — BISOPROLOL FUMARATE 5 MG PO TABS: 5.0000 mg | ORAL_TABLET | Freq: Every day | ORAL | Status: DC

## 2014-12-12 ENCOUNTER — Other Ambulatory Visit (HOSPITAL_COMMUNITY): Payer: Self-pay | Admitting: Adult Health

## 2014-12-18 ENCOUNTER — Other Ambulatory Visit (HOSPITAL_COMMUNITY): Payer: Medicare Other

## 2014-12-18 ENCOUNTER — Ambulatory Visit: Payer: Medicare Other | Admitting: Family

## 2015-01-07 ENCOUNTER — Encounter: Payer: Self-pay | Admitting: Vascular Surgery

## 2015-01-09 ENCOUNTER — Encounter: Payer: Self-pay | Admitting: Family

## 2015-01-09 ENCOUNTER — Other Ambulatory Visit: Payer: Self-pay | Admitting: Vascular Surgery

## 2015-01-09 ENCOUNTER — Ambulatory Visit (HOSPITAL_COMMUNITY)
Admission: RE | Admit: 2015-01-09 | Discharge: 2015-01-09 | Disposition: A | Discharge status: Home / Self Care | Payer: Medicare Other | Source: Ambulatory Visit | Attending: Vascular Surgery | Admitting: Vascular Surgery

## 2015-01-09 ENCOUNTER — Telehealth: Payer: Self-pay | Admitting: Vascular Surgery

## 2015-01-09 ENCOUNTER — Ambulatory Visit (INDEPENDENT_AMBULATORY_CARE_PROVIDER_SITE_OTHER): Payer: Medicare Other | Admitting: Family

## 2015-01-09 ENCOUNTER — Ambulatory Visit (INDEPENDENT_AMBULATORY_CARE_PROVIDER_SITE_OTHER)
Admission: RE | Admit: 2015-01-09 | Discharge: 2015-01-09 | Disposition: A | Discharge status: Home / Self Care | Payer: Medicare Other | Source: Ambulatory Visit | Attending: Vascular Surgery | Admitting: Vascular Surgery

## 2015-01-09 VITALS — BP 145/62 | HR 54 | Temp 97.3°F | Resp 14 | Ht 66.0 in | Wt 136.0 lb

## 2015-01-09 DIAGNOSIS — Z87891 Personal history of nicotine dependence: Secondary | ICD-10-CM | POA: Diagnosis not present

## 2015-01-09 DIAGNOSIS — Z48812 Encounter for surgical aftercare following surgery on the circulatory system: Secondary | ICD-10-CM | POA: Diagnosis not present

## 2015-01-09 DIAGNOSIS — I771 Stricture of artery: Secondary | ICD-10-CM | POA: Insufficient documentation

## 2015-01-09 DIAGNOSIS — I714 Abdominal aortic aneurysm, without rupture, unspecified: Secondary | ICD-10-CM

## 2015-01-09 DIAGNOSIS — Z9889 Other specified postprocedural states: Secondary | ICD-10-CM | POA: Diagnosis not present

## 2015-01-09 DIAGNOSIS — Z01812 Encounter for preprocedural laboratory examination: Secondary | ICD-10-CM | POA: Diagnosis not present

## 2015-01-09 LAB — POC BUN/CREATININE
BUN, IStat: 23 mg/dL (ref 8–26)
CREATININE, ISTAT: 0.9 mg/dL (ref 0.6–1.3)

## 2015-01-09 NOTE — Progress Notes (Signed)
Filed Vitals:   01/09/15 1015 01/09/15 1020 01/09/15 1021 01/09/15 1022  BP: 145/64 153/63 153/63 145/62  Pulse: 52 50 50 54  Temp: 97.3 F (36.3 C)     TempSrc: Oral     Resp: 14     Height: 5\' 6"  (1.676 m)     Weight: 136 lb (61.689 kg)     SpO2: 100%

## 2015-01-09 NOTE — Telephone Encounter (Signed)
Spoke with pt - CTA 01/29/15 1PM [address] [facility], went to [employer] today, CSD after at 3:15 pm. No solids 4 hrs prior - pt verbalized understanding.

## 2015-01-09 NOTE — Progress Notes (Signed)
Established Carotid Patient   History of Present Illness  Erik [name] is a 74 y.o. male patient of Dr. Scot Dock who is s/p bilateral carotid endarterectomies in February and March of 2010.  He also has a AAA diagnosed October 2015 after prominent aortic pulse detected. He returns today for routine surveillance and follow up. The patient has no history of TIA or stroke symptoms. Specifically he denies any history of amaurosis fugax or monocular blindness, unilateral facial drooping, hemiplegia, or receptive or expressive aphasia.  He denies steal symptoms in either hand or arm.  The patient denies abdominal or back pain, his brother has a AAA also.  He reports New Medical or Surgical History: none. He had cardiac stents placed July, 2015, states he may have had an MI. He has bilateral hip pain and thigh pain after walking about 200 yards which resolves with rest; has had this for several years with no worsening, no improvement. He has no known hip or low back problems.  He had a chest CT with contrast in February 2016 to evaluate lung nodules, no aortic arch aneurysm detected, no mention of abdominal aortic aneurysm.  Pt Diabetic: No  Pt smoker: former smoker, quit June, 2015   Pt meds include:  Statin : Yes  ASA: Yes  Other anticoagulants/antiplatelets: Brilinta prescribed by his cardiologist, Dr. Aundra Dubin, for CAD    Past Medical History  Diagnosis Date  . Coronary artery disease     a. LHC (08/2013): Lmain: short 30% distal, LAD: sml D1 & D2, 70% ostial D1, 95-99% LAD stenosis prox D2 LCx: sml/mod ramus subtot. occluded, 40% ostial set off lg OM1, 40% AV LCx after OM1, RCA: 90% prox (DES to RCA and prox LAD)  . Hyperlipidemia   . COPD (chronic obstructive pulmonary disease)   . BPH (benign prostatic hypertrophy)   . History of colonic polyps   . Hypertension   . Heart murmur   . Collagen vascular disease   . Chronic systolic heart failure     a. EF 20-25%, mild LVH,  mod HK, mid apicalanteroseptal myocardium, mild MR, LA mod dilated  . Ischemic cardiomyopathy   . Carotid artery stenosis     a. Bilateral CEA    Social History Social History  Substance Use Topics  . Smoking status: Former Smoker -- 1.50 packs/day for .5 years    Types: Cigarettes    Quit date: 08/27/2013  . Smokeless tobacco: Former Systems developer    Quit date: 09/10/2013  . Alcohol Use: No    Family History Family History  Problem Relation Age of Onset  . Heart disease Brother   . Hyperlipidemia Brother   . Hypertension Mother   . Cancer Father   . Hyperlipidemia Brother   . Hyperlipidemia Brother   . Heart attack Brother   . Hyperlipidemia Brother   . Multiple sclerosis Brother     Surgical History Past Surgical History  Procedure Laterality Date  . Cholecystectomy      Gall Bladder  . Hernia repair    . Carotid endarterectomy  04/17/2008    right  . Carotid endarterectomy  05/30/08    Left  . Cardiac stents  09-2013  . Percutaneous stent intervention  09/11/2013    Procedure: PERCUTANEOUS STENT INTERVENTION;  Surgeon: Troy Sine, MD;  Location: [facility] CATH LAB;  Service: Cardiovascular;;  DES Prox RCA 3.5x15 xience DES Prox LAD 3.0x33 xience  . Coronary angiogram  09/11/2013    Procedure: CORONARY ANGIOGRAM;  Surgeon: Marcello Moores  Floyce Stakes, MD;  Location: [facility] CATH LAB;  Service: Cardiovascular;;    Allergies  Allergen Reactions  . Codeine Swelling    REACTION: Throat swells    Current Outpatient Prescriptions  Medication Sig Dispense Refill  . aspirin 81 MG tablet Take 81 mg by mouth daily.      . bisoprolol (ZEBETA) 5 MG tablet Take 1 tablet (5 mg total) by mouth daily. 90 tablet 2  . bisoprolol (ZEBETA) 5 MG tablet TAKE 1.5 TABLETS (7.5 MG TOTAL) BY MOUTH DAILY. 45 tablet 6  . BRILINTA 90 MG TABS tablet TAKE 1 TABLET (90 MG TOTAL) BY MOUTH 2 (TWO) TIMES DAILY. 60 tablet 6  . ezetimibe-simvastatin (VYTORIN) 10-40 MG per tablet Take 1 tablet by mouth at bedtime. 30 tablet  9  . fish oil-omega-3 fatty acids 1000 MG capsule Take 1 g by mouth daily.      . furosemide (LASIX) 20 MG tablet TAKE 1 TABLET (20 MG TOTAL) BY MOUTH DAILY. 30 tablet 2  . ibuprofen (ADVIL,MOTRIN) 200 MG tablet Take 200 mg by mouth as needed for pain.    Marland Kitchen lisinopril (PRINIVIL,ZESTRIL) 5 MG tablet Take 0.5 tablets (2.5 mg total) by mouth daily. 30 tablet 6  . nitroGLYCERIN (NITROSTAT) 0.4 MG SL tablet Place 1 tablet (0.4 mg total) under the tongue every 5 (five) minutes x 3 doses as needed for chest pain. 30 tablet 12  . tamsulosin (FLOMAX) 0.4 MG CAPS capsule Take 1 capsule (0.4 mg total) by mouth daily. 90 capsule 1  . ticagrelor (BRILINTA) 90 MG TABS tablet Take 1 tablet (90 mg total) by mouth 2 (two) times daily. 180 tablet 6   No current facility-administered medications for this visit.    Review of Systems : See HPI for pertinent positives and negatives.  Physical Examination  Filed Vitals:   01/09/15 1015 01/09/15 1020 01/09/15 1021 01/09/15 1022  BP: 145/64 153/63 153/63 145/62  Pulse: 52 50 50 54  Temp: 97.3 F (36.3 C)     TempSrc: Oral     Resp: 14     Height: 5\' 6"  (1.676 m)     Weight: 136 lb (61.689 kg)     SpO2: 100%      Body mass index is 21.96 kg/(m^2).   General: WDWN male in NAD  GAIT: normal  Eyes: PERRLA  Pulmonary: Non-labored, CTAB, no rales, no rhonchi, & no wheezing.  Cardiac: regular rhythm, positive murmur.   VASCULAR EXAM  Carotid Bruits  Right  Left    positive positive   Aorta is palpable.  Radial pulses: right is 2+ palpable, left is 1+ palpable.   LE Pulses  Right  Left   FEMORAL  2+ palpable  2+ palpable   POPLITEAL  not palpable  not palpable   POSTERIOR TIBIAL  2+ palpable  2+ palpable   DORSALIS PEDIS  ANTERIOR TIBIAL  1+ palpable  1+ palpable    Gastrointestinal: soft, nontender, BS WNL, no r/g, no palpable masses other than the AAA.  Musculoskeletal: Negative muscle  atrophy/wasting. M/S 5/5 throughout, Extremities without ischemic changes.  Neurologic: A&O X 3; Appropriate Affect, sensation is normal, Speech is normal  CN 2-12 intact, Pain and light touch intact in extremities, Motor exam as listed above.                Non-Invasive Vascular Imaging (01/09/2015):  CAROTID DUPLEX:  Patent bilateral endarterectomy sites with no evidence of hyperplasia or restenosis.  Bilateral vertebral arteries are antegrade.  No  significant change in comparison to the last exam on 12/02/13.  AAA Duplex: Patent abdominal aortic aneurysm measuring approximately 5.04 x 4.97 cm in diameter. Bilateral common iliac artery stenosis. Slight increase in size compared to the last exam on 07/04/2014 (4.92 x 4.92 cm).   Assessment: Erik [name] is a 74 y.o. male who is s/p bilateral carotid endarterectomies in February and March of 2010.  He also has a AAA diagnosed October 2015 after prominent aortic pulse detected. His AAA risk factors include a brother with a AAA and pt is a former smoker.  He has no history of stroke or TIA. He has no back or abdominal pain. He has bilateral hip pain and thigh pain after walking about 200 yards which resolves with rest; has had this for several years with no worsening, no improvement. He has no known hip or low back problems. His pedal pulses are palpable.   Today's carotid duplex suggests patent bilateral endarterectomy sites with no evidence of hyperplasia or restenosis.  Bilateral vertebral arteries are antegrade (normal).  No significant change in comparison to the last exam on 12/02/13.  AAA duplex today demonstrates a slight increase in diameter from 4.92 to 5.04 cm in six months.   Plan: Follow-up in 2 weeks with a CTA abdomen/pevis, then see Dr. Scot Dock to discuss results and options to address his AAA.  Follow up in a year with carotid duplex.   I discussed in depth with the patient the nature of  atherosclerosis, and emphasized the importance of maximal medical management including strict control of blood pressure, blood glucose, and lipid levels, obtaining regular exercise, and continued cessation of smoking.  The patient is aware that without maximal medical management the underlying atherosclerotic disease process will progress, limiting the benefit of any interventions. The patient was given information about stroke prevention and what symptoms should prompt the patient to seek immediate medical care. Thank you for allowing [place] to participate in this patient's care.  Clemon Chambers, RN, MSN, FNP-C [facility] Office: [phone]  Clinic Physician: Oneida Alar  01/09/2015 10:11 AM

## 2015-01-09 NOTE — Patient Instructions (Signed)
Stroke Prevention Some medical conditions and behaviors are associated with an increased chance of having a stroke. You may prevent a stroke by making healthy choices and managing medical conditions. HOW CAN I REDUCE MY RISK OF HAVING A STROKE?   Stay physically active. Get at least 30 minutes of activity on most or all days.  Do not smoke. It may also be helpful to avoid exposure to secondhand smoke.  Limit alcohol use. Moderate alcohol use is considered to be:  No more than 2 drinks per day for men.  No more than 1 drink per day for nonpregnant women.  Eat healthy foods. This involves:  Eating 5 or more servings of fruits and vegetables a day.  Making dietary changes that address high blood pressure (hypertension), high cholesterol, diabetes, or obesity.  Manage your cholesterol levels.  Making food choices that are high in fiber and low in saturated fat, trans fat, and cholesterol may control cholesterol levels.  Take any prescribed medicines to control cholesterol as directed by your health care provider.  Manage your diabetes.  Controlling your carbohydrate and sugar intake is recommended to manage diabetes.  Take any prescribed medicines to control diabetes as directed by your health care provider.  Control your hypertension.  Making food choices that are low in salt (sodium), saturated fat, trans fat, and cholesterol is recommended to manage hypertension.  Ask your health care provider if you need treatment to lower your blood pressure. Take any prescribed medicines to control hypertension as directed by your health care provider.  If you are 18-39 years of age, have your blood pressure checked every 3-5 years. If you are 40 years of age or older, have your blood pressure checked every year.  Maintain a healthy weight.  Reducing calorie intake and making food choices that are low in sodium, saturated fat, trans fat, and cholesterol are recommended to manage  weight.  Stop drug abuse.  Avoid taking birth control pills.  Talk to your health care provider about the risks of taking birth control pills if you are over 35 years old, smoke, get migraines, or have ever had a blood clot.  Get evaluated for sleep disorders (sleep apnea).  Talk to your health care provider about getting a sleep evaluation if you snore a lot or have excessive sleepiness.  Take medicines only as directed by your health care provider.  For some people, aspirin or blood thinners (anticoagulants) are helpful in reducing the risk of forming abnormal blood clots that can lead to stroke. If you have the irregular heart rhythm of atrial fibrillation, you should be on a blood thinner unless there is a good reason you cannot take them.  Understand all your medicine instructions.  Make sure that other conditions (such as anemia or atherosclerosis) are addressed. SEEK IMMEDIATE MEDICAL CARE IF:   You have sudden weakness or numbness of the face, arm, or leg, especially on one side of the body.  Your face or eyelid droops to one side.  You have sudden confusion.  You have trouble speaking (aphasia) or understanding.  You have sudden trouble seeing in one or both eyes.  You have sudden trouble walking.  You have dizziness.  You have a loss of balance or coordination.  You have a sudden, severe headache with no known cause.  You have new chest pain or an irregular heartbeat. Any of these symptoms may represent a serious problem that is an emergency. Do not wait to see if the symptoms will   go away. Get medical help at once. Call your local emergency services (911 in [place] Do not drive yourself to the hospital.   This information is not intended to replace advice given to you by your health care provider. Make sure you discuss any questions you have with your health care provider.   Document Released: 04/08/2004 Document Revised: 03/22/2014 Document Reviewed:  09/01/2012 Elsevier Interactive Patient Education 2016 Else[employer] Inc[employer]     Abdominal Aortic Aneurysm An aneurysm is a weakened or damaged part of an artery wall that bulges from the normal force of blood pumping through the body. An abdominal aortic aneurysm is an aneurysm that occurs in the lower part of the aorta, the main artery of the body.  The major concern with an abdominal aortic aneurysm is that it can enlarge and burst (rupture) or blood can flow between the layers of the wall of the aorta through a tear (aorticdissection). Both of these conditions can cause bleeding inside the body and can be life threatening unless diagnosed and treated promptly. CAUSES  The exact cause of an abdominal aortic aneurysm is unknown. Some contributing factors are:   A hardening of the arteries caused by the buildup of fat and other substances in the lining of a blood vessel (arteriosclerosis).  Inflammation of the walls of an artery (arteritis).   Connective tissue diseases, such as Marfan syndrome.   Abdominal trauma.   An infection, such as syphilis or staphylococcus, in the wall of the aorta (infectious aortitis) caused by bacteria. RISK FACTORS  Risk factors that contribute to an abdominal aortic aneurysm may include:  Age older than 60 years.   High blood pressure (hypertension).  Male gender.  Ethnicity (white race).  Obesity.  Family history of aneurysm (first degree relatives only).  Tobacco use. PREVENTION  The following healthy lifestyle habits may help decrease your risk of abdominal aortic aneurysm:  Quitting smoking. Smoking can raise your blood pressure and cause arteriosclerosis.  Limiting or avoiding alcohol.  Keeping your blood pressure, blood sugar level, and cholesterol levels within normal limits.  Decreasing your salt intake. In somepeople, too much salt can raise blood pressure and increase your risk of abdominal aortic aneurysm.  Eating a diet low in  saturated fats and cholesterol.  Increasing your fiber intake by including whole grains, vegetables, and fruits in your diet. Eating these foods may help lower blood pressure.  Maintaining a healthy weight.  Staying physically active and exercising regularly. SYMPTOMS  The symptoms of abdominal aortic aneurysm may vary depending on the size and rate of growth of the aneurysm.Most grow slowly and do not have any symptoms. When symptoms do occur, they may include:  Pain (abdomen, side, lower back, or groin). The pain may vary in intensity. A sudden onset of severe pain may indicate that the aneurysm has ruptured.  Feeling full after eating only small amounts of food.  Nausea or vomiting or both.  Feeling a pulsating lump in the abdomen.  Feeling faint or passing out. DIAGNOSIS  Since most unruptured abdominal aortic aneurysms have no symptoms, they are often discovered during diagnostic exams for other conditions. An aneurysm may be found during the following procedures:  Ultrasonography (A one-time screening for abdominal aortic aneurysm by ultrasonography is also recommended for all men aged 65-75 years who have ever smoked).  X-ray exams.  A computed tomography (CT).  Magnetic resonance imaging (MRI).  Angiography or arteriography. TREATMENT  Treatment of an abdominal aortic aneurysm depends on the size of   your aneurysm, your age, and risk factors for rupture. Medication to control blood pressure and pain may be used to manage aneurysms smaller than 6 cm. Regular monitoring for enlargement may be recommended by your caregiver if:  The aneurysm is 3-4 cm in size (an annual ultrasonography may be recommended).  The aneurysm is 4-4.5 cm in size (an ultrasonography every 6 months may be recommended).  The aneurysm is larger than 4.5 cm in size (your caregiver may ask that you be examined by a vascular surgeon). If your aneurysm is larger than 6 cm, surgical repair may be  recommended. There are two main methods for repair of an aneurysm:   Endovascular repair (a minimally invasive surgery). This is done most often.  Open repair. This method is used if an endovascular repair is not possible.   This information is not intended to replace advice given to you by your health care provider. Make sure you discuss any questions you have with your health care provider.   Document Released: 12/09/2004 Document Revised: 06/26/2012 Document Reviewed: 03/31/2012 Elsevier Interactive Patient Education 2016 [employer]vier Inc.  

## 2015-01-15 ENCOUNTER — Ambulatory Visit: Payer: Medicare Other | Admitting: Vascular Surgery

## 2015-01-27 ENCOUNTER — Encounter: Payer: Self-pay | Admitting: Vascular Surgery

## 2015-01-29 ENCOUNTER — Ambulatory Visit
Admission: RE | Admit: 2015-01-29 | Discharge: 2015-01-29 | Disposition: A | Discharge status: Home / Self Care | Payer: Medicare Other | Source: Ambulatory Visit | Attending: Vascular Surgery | Admitting: Vascular Surgery

## 2015-01-29 ENCOUNTER — Encounter: Payer: Self-pay | Admitting: Vascular Surgery

## 2015-01-29 ENCOUNTER — Ambulatory Visit (INDEPENDENT_AMBULATORY_CARE_PROVIDER_SITE_OTHER): Payer: Medicare Other | Admitting: Vascular Surgery

## 2015-01-29 VITALS — BP 140/51 | HR 64 | Ht 66.0 in | Wt 140.0 lb

## 2015-01-29 DIAGNOSIS — I714 Abdominal aortic aneurysm, without rupture, unspecified: Secondary | ICD-10-CM

## 2015-01-29 DIAGNOSIS — I6523 Occlusion and stenosis of bilateral carotid arteries: Secondary | ICD-10-CM

## 2015-01-29 DIAGNOSIS — Z87891 Personal history of nicotine dependence: Secondary | ICD-10-CM

## 2015-01-29 DIAGNOSIS — I771 Stricture of artery: Secondary | ICD-10-CM

## 2015-01-29 MED ORDER — IOPAMIDOL (ISOVUE-370) INJECTION 76%
75.0000 mL | Freq: Once | INTRAVENOUS | Status: AC | PRN
  Administered 2015-01-29: 75 mL via INTRAVENOUS

## 2015-01-29 NOTE — Progress Notes (Signed)
[facility] [place]  Patient name: Erik [name] MRN: [id number] DOB: 17-Jan-1941 Sex: male  REASON FOR VISIT: Follow up of abdominal aortic aneurysm and bilateral carotid disease.  HPI: Erik [name] is a 73 y.o. male who was last seen in our office by Clemon Chambers, NP on 01/09/2015. The patient has undergone previous bilateral carotid endarterectomies in 2010. The patient has also been followed with an abdominal aortic aneurysm which was diagnosed in October 2015. At the time of his last follow up visit, both carotid endarterectomy sites were widely patent without evidence of stenosis. The abdominal aortic aneurysm measured 5.04 cm in maximum diameter. This had increased slightly in size.  Since I saw him last he denies any history of abdominal pain or back pain. He quit smoking in June of last year. He states that his blood pressure has been under good control.  Past Medical History  Diagnosis Date  . Coronary artery disease     a. LHC (08/2013): Lmain: short 30% distal, LAD: sml D1 & D2, 70% ostial D1, 95-99% LAD stenosis prox D2 LCx: sml/mod ramus subtot. occluded, 40% ostial set off lg OM1, 40% AV LCx after OM1, RCA: 90% prox (DES to RCA and prox LAD)  . Hyperlipidemia   . COPD (chronic obstructive pulmonary disease) ([place])   . BPH (benign prostatic hypertrophy)   . History of colonic polyps   . Hypertension   . Heart murmur   . Collagen vascular disease ([place])   . Chronic systolic heart failure (HCC)     a. EF 20-25%, mild LVH, mod HK, mid apicalanteroseptal myocardium, mild MR, LA mod dilated  . Ischemic cardiomyopathy   . Carotid artery stenosis     a. Bilateral CEA    Family History  Problem Relation Age of Onset  . Heart disease Brother   . Hyperlipidemia Brother   . Hypertension Mother   . Cancer Father   . Hyperlipidemia Brother   . Hyperlipidemia Brother   . Heart attack Brother   . Hyperlipidemia Brother   . Multiple sclerosis  Brother     SOCIAL HISTORY: Social History  Substance Use Topics  . Smoking status: Former Smoker -- 1.50 packs/day for .5 years    Types: Cigarettes    Quit date: 08/27/2013  . Smokeless tobacco: Former Systems developer    Quit date: 09/10/2013  . Alcohol Use: No    Allergies  Allergen Reactions  . Codeine Swelling    REACTION: Throat swells    Current Outpatient Prescriptions  Medication Sig Dispense Refill  . aspirin 81 MG tablet Take 81 mg by mouth daily.      . bisoprolol (ZEBETA) 5 MG tablet Take 1 tablet (5 mg total) by mouth daily. 90 tablet 2  . BRILINTA 90 MG TABS tablet TAKE 1 TABLET (90 MG TOTAL) BY MOUTH 2 (TWO) TIMES DAILY. 60 tablet 6  . ezetimibe-simvastatin (VYTORIN) 10-40 MG per tablet Take 1 tablet by mouth at bedtime. 30 tablet 9  . fish oil-omega-3 fatty acids 1000 MG capsule Take 1 g by mouth daily.      . furosemide (LASIX) 20 MG tablet TAKE 1 TABLET (20 MG TOTAL) BY MOUTH DAILY. 30 tablet 2  . ibuprofen (ADVIL,MOTRIN) 200 MG tablet Take 200 mg by mouth as needed for pain.    Marland Kitchen lisinopril (PRINIVIL,ZESTRIL) 5 MG tablet Take 0.5 tablets (2.5 mg total) by mouth daily. 30 tablet 6  . nitroGLYCERIN (NITROSTAT) 0.4 MG SL tablet Place 1  tablet (0.4 mg total) under the tongue every 5 (five) minutes x 3 doses as needed for chest pain. 30 tablet 12  . tamsulosin (FLOMAX) 0.4 MG CAPS capsule Take 1 capsule (0.4 mg total) by mouth daily. 90 capsule 1  . ticagrelor (BRILINTA) 90 MG TABS tablet Take 1 tablet (90 mg total) by mouth 2 (two) times daily. 180 tablet 6  . bisoprolol (ZEBETA) 5 MG tablet TAKE 1.5 TABLETS (7.5 MG TOTAL) BY MOUTH DAILY. (Patient not taking: Reported on 01/09/2015) 45 tablet 6   No current facility-administered medications for this visit.    REVIEW OF SYSTEMS:  [X]  denotes positive finding, [ ]  denotes negative finding Cardiac  Comments:  Chest pain or chest pressure:    Shortness of breath upon exertion:    Short of breath when lying flat:      Irregular heart rhythm:        Vascular    Pain in calf, thigh, or hip brought on by ambulation:    Pain in feet at night that wakes you up from your sleep:     Blood clot in your veins:    Leg swelling:         Pulmonary    Oxygen at home:    Productive cough:     Wheezing:         Neurologic    Sudden weakness in arms or legs:     Sudden numbness in arms or legs:     Sudden onset of difficulty speaking or slurred speech:    Temporary loss of vision in one eye:     Problems with dizziness:         Gastrointestinal    Blood in stool:     Vomited blood:         Genitourinary    Burning when urinating:     Blood in urine:        Psychiatric    Major depression:         Hematologic    Bleeding problems:    Problems with blood clotting too easily:        Skin    Rashes or ulcers:        Constitutional    Fever or chills:      PHYSICAL EXAM: Filed Vitals:   01/29/15 1532 01/29/15 1534  BP: 142/49 140/51  Pulse: 64   Height: 5\' 6"  (1.676 m)   Weight: 140 lb (63.504 kg)   SpO2: 98%     GENERAL: The patient is a well-nourished male, in no acute distress. The vital signs are documented above. CARDIAC: There is a regular rate and rhythm.  VASCULAR: He has bilateral carotid bruits. He has palpable femoral pulses. PULMONARY: There is good air exchange bilaterally without wheezing or rales. ABDOMEN: Soft and non-tender with normal pitched bowel sounds.  MUSCULOSKELETAL: There are no major deformities or cyanosis. NEUROLOGIC: No focal weakness or paresthesias are detected. SKIN: There are no ulcers or rashes noted. PSYCHIATRIC: The patient has a normal affect.  DATA:   CT ABDOMEN AND PELVIS: I have reviewed his CT of the abdomen and pelvis. By my measurement, the maximum diameter of the aneurysm is 5.3 cm. This is a juxtarenal aneurysm. He does have some disease of the proximal common iliac arteries especially on the left side. Based on his CT scan, he does not  appear to be a candidate for a standard endovascular aneurysm repair.     MEDICAL ISSUES:  JUXTARENAL  ABDOMINAL AORTIC ANEURYSM: The patient's aneurysm is 5.3 cm in maximum diameter. I have explained, that for a normal risk patient we would consider elective repair at 5.5 cm.He does not appear to be a good candidate for an endovascular repair of his aneurysm. I believe that the patient would be at increased risk for open repair given his medical comorbidities. He does have a history of coronary artery disease and has had PTCA a little over a year ago for which she is on Brilinta. In addition he has chronic systolic heart failure and ischemic cardiomyopathy with an ejection fraction of 20-25%. He also has COPD. We have discussed the option of referral to [facility] for evaluation of a fenestrated graft. Given that the aneurysm is not yet 5.5 cm I think it would be reasonable to repeat his ultrasound in 6 months and I'll see him back at that time. If the aneurysm has continued to enlarge then we will need to decide between open repair of his juxtarenal aneurysm or referral to [facility] for consideration for a fenestrated repair. He favors referral to [facility] of the aneurysm enlarges. Fortunately he is not a smoker.  HYPERTENSION: The patient's initial blood pressure today was elevated. We repeated this and this was still elevated. We have encouraged the patient to follow up with their primary care physician for management of their blood pressure.   Deitra Mayo [facility] [place]: [phone]

## 2015-01-31 ENCOUNTER — Telehealth: Payer: Self-pay

## 2015-01-31 NOTE — Telephone Encounter (Signed)
-----   Message from Angelia Mould, MD sent at 01/31/2015  1:33 PM EST ----- Regarding: RE: referral to [facility] Yes thanks ----- Message -----    From: Denman George, RN    Sent: 01/31/2015   9:20 AM      To: Angelia Mould, MD Subject: referral to [facility]                        Wife called to inquire about CTA results; gave her information that the AAA was 5.5 cm, and increased from duplex measurement, in Apr. 2016, from 4.9 cm.  She is requesting that a referral be made to [facility].  Can I go ahead and make this referral?

## 2015-02-03 NOTE — Telephone Encounter (Signed)
I spoke with Erik [name] to inform that all of the appropriate paperwork had been sent and to expect a call from Dr Rosalee Kaufman office to schedule. Erik [name] will let me know if he does not hear from their office within the next 2 days, dpm

## 2015-02-03 NOTE — Telephone Encounter (Signed)
Referral has been faxed to [facility] at [phone].  The CD has been requested from [facility] to be Fed-Ex'd overnight.

## 2015-02-04 ENCOUNTER — Other Ambulatory Visit (HOSPITAL_COMMUNITY): Payer: Self-pay | Admitting: Internal Medicine

## 2015-02-05 ENCOUNTER — Emergency Department (HOSPITAL_COMMUNITY): Payer: Medicare Other

## 2015-02-05 ENCOUNTER — Telehealth: Payer: Self-pay

## 2015-02-05 ENCOUNTER — Observation Stay (HOSPITAL_COMMUNITY)
Admission: EM | Admit: 2015-02-05 | Discharge: 2015-02-06 | Disposition: A | Discharge status: Home / Self Care | Payer: Medicare Other | Attending: Cardiology | Admitting: Cardiology

## 2015-02-05 ENCOUNTER — Encounter (HOSPITAL_COMMUNITY): Payer: Self-pay | Admitting: Emergency Medicine

## 2015-02-05 ENCOUNTER — Telehealth: Payer: Self-pay | Admitting: Cardiology

## 2015-02-05 ENCOUNTER — Other Ambulatory Visit: Payer: Self-pay | Admitting: Physician Assistant

## 2015-02-05 DIAGNOSIS — R072 Precordial pain: Secondary | ICD-10-CM | POA: Diagnosis present

## 2015-02-05 DIAGNOSIS — Z86010 Personal history of colonic polyps: Secondary | ICD-10-CM | POA: Insufficient documentation

## 2015-02-05 DIAGNOSIS — I6529 Occlusion and stenosis of unspecified carotid artery: Secondary | ICD-10-CM | POA: Diagnosis not present

## 2015-02-05 DIAGNOSIS — Z79899 Other long term (current) drug therapy: Secondary | ICD-10-CM | POA: Diagnosis not present

## 2015-02-05 DIAGNOSIS — R0789 Other chest pain: Principal | ICD-10-CM | POA: Insufficient documentation

## 2015-02-05 DIAGNOSIS — J441 Chronic obstructive pulmonary disease with (acute) exacerbation: Secondary | ICD-10-CM | POA: Diagnosis not present

## 2015-02-05 DIAGNOSIS — R079 Chest pain, unspecified: Secondary | ICD-10-CM | POA: Diagnosis not present

## 2015-02-05 DIAGNOSIS — I5022 Chronic systolic (congestive) heart failure: Secondary | ICD-10-CM | POA: Diagnosis not present

## 2015-02-05 DIAGNOSIS — R7303 Prediabetes: Secondary | ICD-10-CM | POA: Diagnosis present

## 2015-02-05 DIAGNOSIS — J449 Chronic obstructive pulmonary disease, unspecified: Secondary | ICD-10-CM | POA: Diagnosis present

## 2015-02-05 DIAGNOSIS — Z7982 Long term (current) use of aspirin: Secondary | ICD-10-CM | POA: Diagnosis not present

## 2015-02-05 DIAGNOSIS — F17219 Nicotine dependence, cigarettes, with unspecified nicotine-induced disorders: Secondary | ICD-10-CM | POA: Diagnosis not present

## 2015-02-05 DIAGNOSIS — E785 Hyperlipidemia, unspecified: Secondary | ICD-10-CM | POA: Diagnosis present

## 2015-02-05 DIAGNOSIS — I2510 Atherosclerotic heart disease of native coronary artery without angina pectoris: Secondary | ICD-10-CM | POA: Diagnosis not present

## 2015-02-05 DIAGNOSIS — N400 Enlarged prostate without lower urinary tract symptoms: Secondary | ICD-10-CM | POA: Diagnosis not present

## 2015-02-05 DIAGNOSIS — I10 Essential (primary) hypertension: Secondary | ICD-10-CM | POA: Diagnosis present

## 2015-02-05 DIAGNOSIS — I255 Ischemic cardiomyopathy: Secondary | ICD-10-CM | POA: Diagnosis not present

## 2015-02-05 LAB — BASIC METABOLIC PANEL
ANION GAP: 7 (ref 5–15)
BUN: 16 mg/dL (ref 6–20)
CHLORIDE: 105 mmol/L (ref 101–111)
CO2: 26 mmol/L (ref 22–32)
Calcium: 8.7 mg/dL — ABNORMAL LOW (ref 8.9–10.3)
Creatinine, Ser: 1.06 mg/dL (ref 0.61–1.24)
GFR calc non Af Amer: >60 mL/min (ref >60)
GLUCOSE: 133 mg/dL — AB (ref 65–99)
Potassium: 3.7 mmol/L (ref 3.5–5.1)
Sodium: 138 mmol/L (ref 135–145)

## 2015-02-05 LAB — CBC
HEMATOCRIT: 38 % — AB (ref 39.0–52.0)
HEMOGLOBIN: 12.4 g/dL — AB (ref 13.0–17.0)
MCH: 29.1 pg (ref 26.0–34.0)
MCHC: 32.6 g/dL (ref 30.0–36.0)
MCV: 89.2 fL (ref 78.0–100.0)
Platelets: 240 10*3/uL (ref 150–400)
RBC: 4.26 MIL/uL (ref 4.22–5.81)
RDW: 14.2 % (ref 11.5–15.5)
WBC: 10.2 10*3/uL (ref 4.0–10.5)

## 2015-02-05 LAB — TROPONIN I

## 2015-02-05 LAB — PROTIME-INR
INR: 1 (ref 0.00–1.49)
Prothrombin Time: 13.4 seconds (ref 11.6–15.2)

## 2015-02-05 LAB — I-STAT TROPONIN, ED: Troponin i, poc: 0.01 ng/mL (ref 0.00–0.08)

## 2015-02-05 LAB — TSH: TSH: 1.178 u[IU]/mL (ref 0.350–4.500)

## 2015-02-05 LAB — BRAIN NATRIURETIC PEPTIDE: B Natriuretic Peptide: 205.2 pg/mL — ABNORMAL HIGH (ref 0.0–100.0)

## 2015-02-05 MED ORDER — LISINOPRIL 2.5 MG PO TABS
2.5000 mg | ORAL_TABLET | Freq: Every day | ORAL | Status: DC
  Administered 2015-02-05: 2.5 mg via ORAL
  Filled 2015-02-05 (×2): qty 1

## 2015-02-05 MED ORDER — ZOLPIDEM TARTRATE 5 MG PO TABS: 5.0000 mg | ORAL_TABLET | Freq: Every evening | ORAL | Status: DC | PRN

## 2015-02-05 MED ORDER — ASPIRIN 81 MG PO CHEW
81.0000 mg | CHEWABLE_TABLET | Freq: Every day | ORAL | Status: DC
  Administered 2015-02-05: 81 mg via ORAL
  Filled 2015-02-05 (×2): qty 1

## 2015-02-05 MED ORDER — TICAGRELOR 90 MG PO TABS
90.0000 mg | ORAL_TABLET | Freq: Two times a day (BID) | ORAL | Status: DC
  Administered 2015-02-05 – 2015-02-06 (×2): 90 mg via ORAL
  Filled 2015-02-05 (×2): qty 1

## 2015-02-05 MED ORDER — BISOPROLOL FUMARATE 5 MG PO TABS
7.5000 mg | ORAL_TABLET | Freq: Every day | ORAL | Status: DC
  Administered 2015-02-05: 7.5 mg via ORAL
  Filled 2015-02-05 (×2): qty 2

## 2015-02-05 MED ORDER — ENOXAPARIN SODIUM 40 MG/0.4ML ~~LOC~~ SOLN
40.0000 mg | SUBCUTANEOUS | Status: DC
  Administered 2015-02-05: 40 mg via SUBCUTANEOUS
  Filled 2015-02-05: qty 0.4

## 2015-02-05 MED ORDER — FUROSEMIDE 20 MG PO TABS
20.0000 mg | ORAL_TABLET | Freq: Every day | ORAL | Status: DC
  Filled 2015-02-05 (×2): qty 1

## 2015-02-05 MED ORDER — SODIUM CHLORIDE 0.9 % IJ SOLN
3.0000 mL | Freq: Two times a day (BID) | INTRAMUSCULAR | Status: DC
  Administered 2015-02-05: 3 mL via INTRAVENOUS

## 2015-02-05 MED ORDER — EZETIMIBE-SIMVASTATIN 10-40 MG PO TABS
1.0000 | ORAL_TABLET | Freq: Every day | ORAL | Status: DC
  Administered 2015-02-05: 1 via ORAL
  Filled 2015-02-05 (×2): qty 1

## 2015-02-05 MED ORDER — ALPRAZOLAM 0.25 MG PO TABS: 0.2500 mg | ORAL_TABLET | Freq: Two times a day (BID) | ORAL | Status: DC | PRN

## 2015-02-05 MED ORDER — SODIUM CHLORIDE 0.9 % IV SOLN: 250.0000 mL | INTRAVENOUS | Status: DC | PRN

## 2015-02-05 MED ORDER — TAMSULOSIN HCL 0.4 MG PO CAPS
0.4000 mg | ORAL_CAPSULE | Freq: Every day | ORAL | Status: DC
  Administered 2015-02-05: 0.4 mg via ORAL
  Filled 2015-02-05: qty 1

## 2015-02-05 MED ORDER — ONDANSETRON HCL 4 MG/2ML IJ SOLN: 4.0000 mg | Freq: Four times a day (QID) | INTRAMUSCULAR | Status: DC | PRN

## 2015-02-05 MED ORDER — OMEGA-3-ACID ETHYL ESTERS 1 G PO CAPS
1.0000 g | ORAL_CAPSULE | Freq: Every day | ORAL | Status: DC
  Administered 2015-02-05: 1 g via ORAL
  Filled 2015-02-05 (×2): qty 1

## 2015-02-05 MED ORDER — SODIUM CHLORIDE 0.9 % IJ SOLN
3.0000 mL | INTRAMUSCULAR | Status: DC | PRN
Start: 2015-02-05 — End: 2015-02-06

## 2015-02-05 MED ORDER — ACETAMINOPHEN 325 MG PO TABS: 650.0000 mg | ORAL_TABLET | ORAL | Status: DC | PRN

## 2015-02-05 MED ORDER — NITROGLYCERIN 0.4 MG SL SUBL: 0.4000 mg | SUBLINGUAL_TABLET | SUBLINGUAL | Status: DC | PRN

## 2015-02-05 MED ORDER — ASPIRIN 81 MG PO CHEW
324.0000 mg | CHEWABLE_TABLET | Freq: Once | ORAL | Status: AC
  Administered 2015-02-05: 324 mg via ORAL
  Filled 2015-02-05: qty 4

## 2015-02-05 NOTE — Telephone Encounter (Signed)
Wife calling (received permission from pt to speak with her) stating he has CP this AM.  Was working in garage on his wood work.  Came into house stating he was not feeling well and was having CP.  On scale of 1-10 rated the pain as a 7-8.  Took NTG and now feels shaky and nervous.  Still having some mild discomfort. Saw that he had a Life Vest put on in Sept 2015 but he states he only wore for 2 mo. Spoke w/Lori Gerhardt,NP/flex who states he needs to go to ER.  Notified Trish/cardmaster that he was going to ER.

## 2015-02-05 NOTE — Progress Notes (Addendum)
OP Lexiscan MV ordered.   Lenoard Aden 02/05/2015 3:44 PM Beeper [id number]   Outpatient Myoview is scheduled on 02/13/2015 and Cardiology follow-up is scheduled on 02/18/2015.  Signed, Erma Heritage, PA-C 02/05/2015, 5:04 PM Pager: [phone]

## 2015-02-05 NOTE — Telephone Encounter (Signed)
New Message   Pt c/o of Chest Pain: 1. Are you having CP right now? Yes (Mildly)  2. Are you experiencing any other symptoms (ex. SOB, nausea, vomiting, sweating)? Nausea, Sweating.  3. How long have you been experiencing CP? Within the last 2 hours he has experience all of these symptoms  4. Is your CP continuous or coming and going? Continuous ( not as bad after he took the nitro)  5. Have you taken Nitroglycerin? Yes

## 2015-02-05 NOTE — Telephone Encounter (Signed)
Wife called to report pt. C/o chest pain, feeling sick and hot.  Reported he took Ntg. x 1 with slight improvement.  Questioned if this could be his aneurysm?  Advised that she should call 911, and have pt. sent to the ER for further evaluation.  Verb. understanding.

## 2015-02-05 NOTE — ED Notes (Signed)
Cardiology PA at bedside. 

## 2015-02-05 NOTE — H&P (Signed)
History and Physical   Patient ID: Erik [name] MRN: [id number], DOB/AGE: [birth year] 74 y.o. Date of Encounter: 02/05/2015  Primary Physician: [name], MD Primary Cardiologist: Dr Aundra Dubin VVS: Dr Scot Dock  Chief Complaint:  Chest pain  HPI: Erik [name] is a 74 y.o. male with a history of CAD w/ stent 2015, AAA 5.3 cm 12/2014 (pending evaluation for stent repair in [place]), HTN, HL, COPD, SEM, CEA. EF previously 25%>>55% by echo 11/2013.  Today, pt in USOH, not doing much today and had onset of SSCP at about 11 am. The pain was sharp, 7/10, no change w/ deep inspiration, no SOB, +nausea, no vomiting, felt hot but no diaphoresis.  The pain started to ease off on its own, but he did not feel well. Pt laid down which also seemed to help. He took SL NTG x 1 which also seemed to help (but was old). The sharp pain resolved by about noon, but he felt tired and sleepy. There was residual pressure at a 2/10, it finally resolved without intervention.   The symptom prior to stenting in 2015 was SOB, no chest pain. He has had this pain before, but not as bad. It has radiated up into his jaw in the past, but not today. Has asked MDs about it, but never got a diagnosis. The last episode was about 6 weeks ago. Most episodes are associated with some kind of exertion. No stress test or cath since the stents. His EF was initially low, but recovered.   Past Medical History  Diagnosis Date  . Coronary artery disease     a. LHC (08/2013): Lmain: short 30% distal, LAD: sml D1 & D2, 70% ostial D1, 95-99% LAD stenosis prox D2 LCx: sml/mod ramus subtot. occluded, 40% ostial set off lg OM1, 40% AV LCx after OM1, RCA: 90% prox (DES to RCA and prox LAD)  . Hyperlipidemia   . COPD (chronic obstructive pulmonary disease) ([place])   . BPH (benign prostatic hypertrophy)   . History of colonic polyps   . Hypertension   . Heart murmur   . Collagen vascular disease ([place])   . Chronic systolic heart failure  (HCC)     a. EF 20-25%, mild LVH, mod HK, mid apicalanteroseptal myocardium, mild MR, LA mod dilated  . Ischemic cardiomyopathy   . Carotid artery stenosis     a. Bilateral CEA    Surgical History:  Past Surgical History  Procedure Laterality Date  . Cholecystectomy      Gall Bladder  . Hernia repair    . Carotid endarterectomy  04/17/2008    right  . Carotid endarterectomy  05/30/08    Left  . Cardiac stents  09-2013  . Percutaneous stent intervention  09/11/2013    Procedure: PERCUTANEOUS STENT INTERVENTION;  Surgeon: Troy Sine, MD;  Location: [facility] CATH LAB;  Service: Cardiovascular;;  DES Prox RCA 3.5x15 xience DES Prox LAD 3.0x33 xience  . Coronary angiogram  09/11/2013    Procedure: CORONARY ANGIOGRAM;  Surgeon: Troy Sine, MD;  Location: [facility] CATH LAB;  Service: Cardiovascular;;    I have reviewed the patient's current medications. Medication Sig  aspirin 81 MG tablet Take 81 mg by mouth daily.    bisoprolol (ZEBETA) 5 MG tablet Take 1 tablet (5 mg total) by mouth daily. Patient taking differently: Take 7.5 mg by mouth daily.   BRILINTA 90 MG TABS tablet TAKE 1 TABLET (90 MG TOTAL) BY MOUTH 2 (TWO) TIMES DAILY.  ezetimibe-simvastatin (VYTORIN) 10-40 MG per tablet Take 1 tablet by mouth at bedtime.  fish oil-omega-3 fatty acids 1000 MG capsule Take 1 g by mouth daily.    furosemide (LASIX) 20 MG tablet TAKE 1 TABLET (20 MG TOTAL) BY MOUTH DAILY.  ibuprofen (ADVIL,MOTRIN) 200 MG tablet Take 200 mg by mouth every 6 (six) hours as needed for moderate pain.   lisinopril (PRINIVIL,ZESTRIL) 5 MG tablet Take 0.5 tablets (2.5 mg total) by mouth daily.  nitroGLYCERIN (NITROSTAT) 0.4 MG SL tablet Place 1 tablet (0.4 mg total) under the tongue every 5 (five) minutes x 3 doses as needed for chest pain.  tamsulosin (FLOMAX) 0.4 MG CAPS capsule Take 1 capsule (0.4 mg total) by mouth daily.  bisoprolol (ZEBETA) 5 MG tablet TAKE 1.5 TABLETS (7.5 MG TOTAL) BY MOUTH DAILY. Patient not  taking: Reported on 01/09/2015  ticagrelor (BRILINTA) 90 MG TABS tablet Take 1 tablet (90 mg total) by mouth 2 (two) times daily. Patient not taking: Reported on 02/05/2015   Allergies:  Allergies  Allergen Reactions  . Codeine Swelling    REACTION: Throat swells   Social History   Social History  . Marital Status: Married    Spouse Name: N/A  . Number of Children: N/A  . Years of Education: N/A   Occupational History  . Retired Development worker, community    Social History Main Topics  . Smoking status: Former Smoker -- 1.50 packs/day for .5 years    Types: Cigarettes    Quit date: 08/27/2013  . Smokeless tobacco: Former Systems developer    Quit date: 09/10/2013  . Alcohol Use: No  . Drug Use: No  . Sexual Activity: Not on file   Other Topics Concern  . Not on file   Social History Narrative   Lives with wife in [place]. Retired from the post office       Family History  Problem Relation Age of Onset  . Heart disease Brother   . Hyperlipidemia Brother   . Hypertension Mother   . Cancer Father   . Hyperlipidemia Brother   . Hyperlipidemia Brother   . Heart attack Brother   . Hyperlipidemia Brother   . Multiple sclerosis Brother    Family Status  Relation Status Death Age  . Brother Alive   . Mother Alive     HTN, Born 13  . Father Deceased     brain tumor  . Brother Alive     HLD, Heart disease  . Brother Alive     HLD, CAD  . Brother Alive     HLD, MS    Review of Systems:   Full 14-point review of systems otherwise negative except as noted above.  Physical Exam: Blood pressure 131/55, pulse 58, temperature 97.8 F (36.6 C), temperature source Oral, resp. rate 18, height 5\' 6"  (1.676 m), weight 134 lb (60.782 kg), SpO2 99 %. General: Well developed, well nourished,male in no acute distress. Head: Normocephalic, atraumatic, sclera non-icteric, no xanthomas, nares are without discharge. Dentition: moderate Neck: Bilateral carotid bruits. JVD not elevated. No  thyromegally Lungs: Good expansion bilaterally. without wheezes or rhonchi. Few rales L base Heart: Regular rate and rhythm with S1 S2.  No S3 or S4.  Soft murmur, no rubs, or gallops appreciated. Abdomen: Soft, non-tender, non-distended with normoactive bowel sounds. No hepatomegaly. No rebound/guarding. No obvious abdominal masses. Bilateral femoral bruits noted. Aneurysm not palpated Msk:  Strength and tone appear normal for age. No joint deformities or effusions, no spine or costo-vertebral angle  tenderness. Extremities: No clubbing or cyanosis. No edema.  Distal pedal pulses are 2+ in 4 extrem Neuro: Alert and oriented X 3. Moves all extremities spontaneously. No focal deficits noted. Psych:  Responds to questions appropriately with a normal affect. Skin: No rashes or lesions noted  Labs:  Lab Results  Component Value Date   WBC 10.2 02/05/2015   HGB 12.4* 02/05/2015   HCT 38.0* 02/05/2015   MCV 89.2 02/05/2015   PLT 240 02/05/2015     Recent Labs Lab 02/05/15 1247  NA 138  K 3.7  CL 105  CO2 26  BUN 16  CREATININE 1.06  CALCIUM 8.7*  GLUCOSE 133*    Recent Labs  02/05/15 1309  TROPIPOC 0.01   B NATRIURETIC PEPTIDE  Date/Time Value Ref Range Status  02/05/2015 12:47 PM 205.2* 0.0 - 100.0 pg/mL Final    Radiology/Studies: Dg Chest 2 View 02/05/2015  CLINICAL DATA:  Chest pain.  Smoker. EXAM: CHEST  2 VIEW COMPARISON:  09/10/2013 FINDINGS: Heart and mediastinal contours are within normal limits. No focal opacities or effusions. No acute bony abnormality. IMPRESSION: No active cardiopulmonary disease. Electronically Signed   By: Rolm Baptise M.D.   On: 02/05/2015 14:00     Cardiac Cath: 09/11/2013 Left mainstem: Short left main with 30% distal tapering.  Left anterior descending (LAD): Small D1 and D2. 70% ostial D1 stenosis. 95-99% proximal LAD stenosis just proximal to D2.  Left circumflex (LCx): Small to moderate ramus, subtotally occluded. 40% ostial  stenosis of a large OM1. 40% AV LCx after OM1.  Right coronary artery (RCA): 90% proximal RCA stenosis.  ANGIOGRAPHY: LAD: At the start of the intervention, the LAD had diffuse calcification with smooth 50% stenosis. The vessel then had a sharp upward angle where there was a 99% stenosis. Following successful intervention with ultimate insertion of a 3.0x33 mm Xience Alpine stent, postdilated to 3.3 mm the entire proximal LAD was reduced to 0%. There was brisk TIMI-3 flow. There was no evidence for dissection. RCA: The RCA artery had 95% proximal stenosis and then was followed by an ectatic segment. There is 30% narrowing beyond the ectatic segment. The distal RCA was large caliber and dominant. Following successful PTCA and stenting with a 3.5x15 mm Xience Alpine stent, postdilated to 3.75 mm, 95% stenosis was reduced to 0%. There was brisk TIMI-3 flow. There is no evidence for dissection IMPRESSION: Successful 2 vessel coronary intervention to the diffusely diseased proximal LAD with insertion of a 3.0x33 mm Xience stent, postdilated to 3.3 mm, and a 3.5x15 mm Xience stent in the RCA post dilated to 3.75 mm.  Echo: 11/16/2013 - Left ventricle: The cavity size was normal. Wall thickness was increased in a pattern of mild LVH. Mild apical hypokinesis. Septal-lateral dyssynchrony suggestive of bundle branch block. Systolic function was normal. The estimated ejection fraction was in the range of 55% to 60%. Doppler parameters are consistent with abnormal left ventricular relaxation (grade 1 diastolicdysfunction). - Aortic valve: There was no stenosis. There was mildregurgitation. - Mitral valve: Moderately calcified annulus. Mildly calcified leaflets . There was trivial regurgitation. - Left atrium: The atrium was mildly dilated. - Right ventricle: The cavity size was normal. Systolic function was normal. - Tricuspid valve: Peak RV-RA gradient (S): 23 mm Hg. -  Pulmonary arteries: PA peak pressure: 26 mm Hg (S). - Inferior vena cava: The vessel was normal in size. The respirophasic diameter changes were in the normal range (>= 50%), consistent with normal central venous pressure. Impressions: -  Normal LV size and systolic function, EF 0000000. Mild LV hypertrophy. Mild apical hypokinesis. Septal-lateral dyssynchrony suggestive of bundle branch block. Mild AI. Normal RV size and systolic function.  ECG: 02/05/2015 SR, inc LBBB HR 64  ASSESSMENT AND PLAN:  Principal Problem:   Chest pain with moderate risk for cardiac etiology - sig hx CAD and AAA - admit, r/o MI - no heparin unless ez elevate - nitrates, same thing - continue ASA, BB, statin, Brilinta - if ez neg MI, MD advise on stress vs cath     Active Problems:   Hyperlipidemia - continue statin, followed by his primary MD      COPD, mild (HCC) -  No ongoing problems - no routine rx     Prediabetes -  CBG 133 is non-fasting - ck A1c     HTN (hypertension) -  Per pt, BP pretty well controlled at home - continue home rx, follow here    AAA (abdominal aortic aneurysm) without rupture [facility]) - is to be rechecked May or so  - 5.5 cm in October    Signed, Barrett, Rhonda, Hershal Coria 02/05/2015 3:22 PM Beeper [phone]  As above, patient seen and examined. Briefly he is a 74 year old male with past medical history of coronary artery disease, abdominal aortic aneurysm, hypertension, hyperlipidemia for evaluation of chest pain. Patient has had intermittent epigastric/low chest pain for years. It is described as a sharp pain without radiation. Not pleuritic, positional, exertional or related to food. Last approximately 10 minutes and resolves. He had a more severe episode today with nausea. Resume pain-free. Initial enzymes negative. Electrocardiogram shows Sinus rhythm with nonspecific ST changes. Symptoms are atypical. Plan to admit and rule out myocardial infarction with  serial enzymes. If negative he can be discharged tomorrow morning with outpatient nuclear study. Note he has a large abdominal aortic aneurysm and is being referred to [facility] for further management. Continue aspirin, brilinta, beta blocker and statin. Would consider discontinuing brilinta at next office visit. Kirk Ruths

## 2015-02-05 NOTE — ED Notes (Signed)
Pt from home for eval of substernal chest discomfort that started this morning while pt was walking in his house, pt reports sob and feeling hot all ove3r. Pt took 1 nitro pill with relief, no aspirin today. Pt denies any abnormal swelling at this time, nad noted, skin warm and dry.

## 2015-02-05 NOTE — Discharge Instructions (Signed)
You have a Stress Test scheduled at [facility]. Your doctor has ordered this test to get a better idea of how your heart works. ° °Please arrive 15 minutes early for paperwork.  ° °Location: °[address], [address] °[place],  [address] °[phone] ° °Instructions: °· No food/drink after midnight the night before.  °· No caffeine/decaf products 24 hours before, including medicines such as Excedrin or Goody Powders. Call if there are any questions.  °· Wear comfortable clothes and shoes.  °· It is OK to take your morning meds with a sip of water EXCEPT for those types of medicines listed below or otherwise instructed. ° °Special Medication Instructions: °· Beta blockers such as metoprolol (Lopressor/Toprol XL), atenolol (Tenormin), carvedilol (Coreg), nebivolol (Bystolic), propranolol (Inderal) should not be taken for 24 hours before the test. °· Calcium channel blockers such as diltiazem (Cardizem) or verapmil (Calan) should not be taken for 24 hours before the test. °· Remove nitroglycerin patches and do not take nitrate preparations such as Imdur/isosorbide the day of your test. °· No Persantine/Theophylline or Aggrenox medicines should be used within 24 hours of the test.  ° °What To Expect: °The whole test will take several hours. When you arrive in the lab, the technician will inject a small amount of radioactive tracer into your arm through an IV while you are resting quietly. This helps us to form pictures of your heart. You will likely only feel a sting from the IV. After a waiting period, resting pictures will be obtained under a big camera. These are the "before" pictures. ° °Next, you will be prepped for the stress portion of the test. This may include either walking on a treadmill or receiving a medicine that helps to dilate blood vessels in your heart to simulate the effect of exercise on your heart. If you are walking on a treadmill, you will walk at different paces to  try to get your heart rate to a goal number that is based on your age. If your doctor has chosen the pharmacologic test, then you will receive a medicine through your IV that may cause temporary nausea, flushing, shortness of breath and sometimes chest discomfort or vomiting. This is typically short-lived and usually resolves quickly. Your blood pressure and heart rate will be monitored, and we will be watching your EKG on a computer screen for any changes. During this portion of the test, the radiologist will inject another small amount of radioactive tracer into your IV. After a waiting period, you will undergo a second set of pictures. These are the "after" pictures. ° °The doctor reading the test will compare the before-and-after images to look for evidence of heart blockages or heart weakness. In certain instances, this test is done over 2 days but usually only takes 1 day to complete. ° °

## 2015-02-05 NOTE — ED Provider Notes (Signed)
CSN: [id number]     Arrival date & time 02/05/15  1239 History   First MD Initiated Contact with Patient 02/05/15 1323     Chief Complaint  Patient presents with  . Chest Pain     (Consider location/radiation/quality/duration/timing/severity/associated sxs/prior Treatment) HPI KEYMONI KAMRADT is a 74 y.o. male with history of coronary artery disease, stents 1 year ago, COPD, hypertension, collagen vascular disease, systolic heart failure, presents to emergency department complaining of chest pressure, shortness of breath. Patient states that his symptoms started this morning while working in his workshop. He denies doing anything exertional. He states he had central chest pressure which did not radiate. He became very hot and short of breath. Reports associated dizziness as well. He took one nitroglycerin which helped. He states his pain mainly improved, he just feels "slight discomfort in the chest". He denies any other symptoms at this time. Patient denies any recent illnesses. He denies any fever or chills. No cough. He denies any extremity swelling. He has been compliant with all of his medications.  Past Medical History  Diagnosis Date  . Coronary artery disease     a. LHC (08/2013): Lmain: short 30% distal, LAD: sml D1 & D2, 70% ostial D1, 95-99% LAD stenosis prox D2 LCx: sml/mod ramus subtot. occluded, 40% ostial set off lg OM1, 40% AV LCx after OM1, RCA: 90% prox (DES to RCA and prox LAD)  . Hyperlipidemia   . COPD (chronic obstructive pulmonary disease) ([place])   . BPH (benign prostatic hypertrophy)   . History of colonic polyps   . Hypertension   . Heart murmur   . Collagen vascular disease ([place])   . Chronic systolic heart failure (HCC)     a. EF 20-25%, mild LVH, mod HK, mid apicalanteroseptal myocardium, mild MR, LA mod dilated  . Ischemic cardiomyopathy   . Carotid artery stenosis     a. Bilateral CEA   Past Surgical History  Procedure Laterality Date  . Cholecystectomy       Gall Bladder  . Hernia repair    . Carotid endarterectomy  04/17/2008    right  . Carotid endarterectomy  05/30/08    Left  . Cardiac stents  09-2013  . Percutaneous stent intervention  09/11/2013    Procedure: PERCUTANEOUS STENT INTERVENTION;  Surgeon: Troy Sine, MD;  Location: [facility] CATH LAB;  Service: Cardiovascular;;  DES Prox RCA 3.5x15 xience DES Prox LAD 3.0x33 xience  . Coronary angiogram  09/11/2013    Procedure: CORONARY ANGIOGRAM;  Surgeon: Troy Sine, MD;  Location: [facility] CATH LAB;  Service: Cardiovascular;;   Family History  Problem Relation Age of Onset  . Heart disease Brother   . Hyperlipidemia Brother   . Hypertension Mother   . Cancer Father   . Hyperlipidemia Brother   . Hyperlipidemia Brother   . Heart attack Brother   . Hyperlipidemia Brother   . Multiple sclerosis Brother    Social History  Substance Use Topics  . Smoking status: Former Smoker -- 1.50 packs/day for .5 years    Types: Cigarettes    Quit date: 08/27/2013  . Smokeless tobacco: Former Systems developer    Quit date: 09/10/2013  . Alcohol Use: No    Review of Systems  Constitutional: Negative for fever and chills.  Respiratory: Positive for chest tightness and shortness of breath. Negative for cough.   Cardiovascular: Positive for chest pain. Negative for palpitations and leg swelling.  Gastrointestinal: Negative for nausea, vomiting, abdominal pain, diarrhea and  abdominal distention.  Genitourinary: Negative for dysuria, urgency, frequency and hematuria.  Musculoskeletal: Negative for myalgias, arthralgias, neck pain and neck stiffness.  Skin: Negative for rash.  Neurological: Negative for dizziness, weakness, light-headedness, numbness and headaches.  All other systems reviewed and are negative.     Allergies  Codeine  Home Medications   Prior to Admission medications   Medication Sig Start Date End Date Taking? Authorizing Provider  aspirin 81 MG tablet Take 81 mg by mouth daily.       Historical Provider, MD  bisoprolol (ZEBETA) 5 MG tablet Take 1 tablet (5 mg total) by mouth daily. 11/11/14   Amy D Clegg, NP  bisoprolol (ZEBETA) 5 MG tablet TAKE 1.5 TABLETS (7.5 MG TOTAL) BY MOUTH DAILY. Patient not taking: Reported on 01/09/2015 12/13/14   Amy D Clegg, NP  BRILINTA 90 MG TABS tablet TAKE 1 TABLET (90 MG TOTAL) BY MOUTH 2 (TWO) TIMES DAILY. 11/08/14   Jolaine Artist, MD  ezetimibe-simvastatin (VYTORIN) 10-40 MG per tablet Take 1 tablet by mouth at bedtime. 06/18/14   Amy Cletis Athens, MD  fish oil-omega-3 fatty acids 1000 MG capsule Take 1 g by mouth daily.      Historical Provider, MD  furosemide (LASIX) 20 MG tablet TAKE 1 TABLET (20 MG TOTAL) BY MOUTH DAILY. 02/04/15   Jolaine Artist, MD  ibuprofen (ADVIL,MOTRIN) 200 MG tablet Take 200 mg by mouth as needed for pain.    Historical Provider, MD  lisinopril (PRINIVIL,ZESTRIL) 5 MG tablet Take 0.5 tablets (2.5 mg total) by mouth daily. 10/14/14   Larey Dresser, MD  nitroGLYCERIN (NITROSTAT) 0.4 MG SL tablet Place 1 tablet (0.4 mg total) under the tongue every 5 (five) minutes x 3 doses as needed for chest pain. 09/13/13   Amy D Ninfa Meeker, NP  tamsulosin (FLOMAX) 0.4 MG CAPS capsule Take 1 capsule (0.4 mg total) by mouth daily. 11/11/14   Amy Cletis Athens, MD  ticagrelor (BRILINTA) 90 MG TABS tablet Take 1 tablet (90 mg total) by mouth 2 (two) times daily. 10/10/13   Shaune Pascal Bensimhon, MD   BP 131/55 mmHg  Pulse 58  Temp(Src) 97.8 F (36.6 C) (Oral)  Resp 18  Ht 5\' 6"  (1.676 m)  Wt 60.782 kg  BMI 21.64 kg/m2  SpO2 99% Physical Exam  Constitutional: He is oriented to person, place, and time. He appears well-developed and well-nourished. No distress.  HENT:  Head: Normocephalic and atraumatic.  Eyes: Conjunctivae are normal.  Neck: Neck supple.  Cardiovascular: Normal rate, regular rhythm and normal heart sounds.   Pulmonary/Chest: Effort normal and breath sounds normal. No respiratory distress. He has no wheezes. He has no  rales. He exhibits no tenderness.  Abdominal: Soft. Bowel sounds are normal. He exhibits no distension. There is no tenderness. There is no rebound.  Musculoskeletal: He exhibits no edema.  Neurological: He is alert and oriented to person, place, and time.  Skin: Skin is warm and dry.  Nursing note and vitals reviewed.   ED Course  Procedures (including critical care time) Labs Review Labs Reviewed  BASIC METABOLIC PANEL - Abnormal; Notable for the following:    Glucose, Bld 133 (*)    Calcium 8.7 (*)    All other components within normal limits  CBC - Abnormal; Notable for the following:    Hemoglobin 12.4 (*)    HCT 38.0 (*)    All other components within normal limits  BRAIN NATRIURETIC PEPTIDE  I-STAT TROPOININ, ED  Imaging Review No results found. I have personally reviewed and evaluated these images and lab results as part of my medical decision-making.   EKG Interpretation   Date/Time:  Wednesday February 05 2015 12:50:47 EST Ventricular Rate:  64 PR Interval:  176 QRS Duration: 118 QT Interval:  434 QTC Calculation: 447 R Axis:   85 Text Interpretation:  Normal sinus rhythm Incomplete left bundle branch  block Borderline ECG Confirmed by RAY MD, Andee Poles [id number]) on 02/05/2015  12:51:51 PM      MDM   Final diagnoses:  Chest pain, unspecified chest pain type    patient emergency department with episode of chest pain that started this morning. He is currently symptom free. He has history of MI with stenting one year ago. On exam, he appears to be in no acute distress. His vital signs are normal except for slight bradycardia. Sinus rhythm on EKG with no acute changes. He does not appear to be fluid overloaded. Lungs are clear. Will check labs including troponin. Chest x-ray. Aspirin ordered.   Initial troponin, chest x-ray, labs unremarkable. I discussed patient with cardiologist who came down and saw patient. They will admit for further observation and  treatment.  Filed Vitals:   02/05/15 1246 02/05/15 1326 02/05/15 1600 02/05/15 1647  BP: 125/81 131/55 138/63 161/59  Pulse: 56 58 54 58  Temp: 97.8 F (36.6 C)   98.4 F (36.9 C)  TempSrc: Oral   Oral  Resp: 16 18 17    Height: 5\' 6"  (1.676 m)   5\' 6"  (1.676 m)  Weight: 60.782 kg   60.328 kg  SpO2: 99% 99% 97% 100%       Jeannett Senior, PA-C 02/05/15 [place], DO 02/06/15 1410

## 2015-02-06 DIAGNOSIS — R0789 Other chest pain: Secondary | ICD-10-CM | POA: Diagnosis not present

## 2015-02-06 DIAGNOSIS — R079 Chest pain, unspecified: Secondary | ICD-10-CM | POA: Diagnosis not present

## 2015-02-06 LAB — LIPID PANEL
CHOL/HDL RATIO: 2.7 ratio
CHOLESTEROL: 120 mg/dL (ref 0–200)
HDL: 44 mg/dL (ref >40)
LDL Cholesterol: 57 mg/dL (ref 0–99)
TRIGLYCERIDES: 96 mg/dL (ref <150)
VLDL: 19 mg/dL (ref 0–40)

## 2015-02-06 LAB — COMPREHENSIVE METABOLIC PANEL
ALBUMIN: 3 g/dL — AB (ref 3.5–5.0)
ALK PHOS: 52 U/L (ref 38–126)
ALT: 13 U/L — ABNORMAL LOW (ref 17–63)
ANION GAP: 6 (ref 5–15)
AST: 15 U/L (ref 15–41)
BILIRUBIN TOTAL: 0.4 mg/dL (ref 0.3–1.2)
BUN: 21 mg/dL — ABNORMAL HIGH (ref 6–20)
CALCIUM: 8.7 mg/dL — AB (ref 8.9–10.3)
CO2: 26 mmol/L (ref 22–32)
Chloride: 107 mmol/L (ref 101–111)
Creatinine, Ser: 1.05 mg/dL (ref 0.61–1.24)
Glucose, Bld: 95 mg/dL (ref 65–99)
POTASSIUM: 3.9 mmol/L (ref 3.5–5.1)
Sodium: 139 mmol/L (ref 135–145)
TOTAL PROTEIN: 6 g/dL — AB (ref 6.5–8.1)

## 2015-02-06 LAB — HEMOGLOBIN A1C
HEMOGLOBIN A1C: 5.8 % — AB (ref 4.8–5.6)
MEAN PLASMA GLUCOSE: 120 mg/dL

## 2015-02-06 LAB — TROPONIN I: Troponin I: <0.03 ng/mL (ref <0.031)

## 2015-02-06 MED ORDER — FAMOTIDINE 20 MG PO TABS: 20.0000 mg | ORAL_TABLET | Freq: Two times a day (BID) | ORAL | Status: DC

## 2015-02-06 MED ORDER — BISOPROLOL FUMARATE 5 MG PO TABS: 7.5000 mg | ORAL_TABLET | Freq: Every day | ORAL | Status: DC

## 2015-02-06 NOTE — Progress Notes (Signed)
Pt discharged home with wife.  Reviewed discharge instructions and education, all question answered. Assessment unchanged from earlier.

## 2015-02-06 NOTE — Progress Notes (Signed)
Patient ID: Erik [name], male   DOB: 1940/04/29, 74 y.o.   MRN: [id number]     Subjective:    No chest pain overnight or this AM  Objective:   Temp:  [97.8 F (36.6 C)-98.9 F (37.2 C)] 98.4 F (36.9 C) (11/24 0448) Pulse Rate:  [54-62] 54 (11/24 0448) Resp:  [16-18] 17 (11/23 1600) BP: (122-161)/(47-81) 135/54 mmHg (11/24 0448) SpO2:  [97 %-100 %] 97 % (11/24 0448) Weight:  [132 lb 8 oz (60.102 kg)-134 lb (60.782 kg)] 132 lb 8 oz (60.102 kg) (11/24 0448) Last BM Date: 02/05/15  Filed Weights   02/05/15 1246 02/05/15 1647 02/06/15 0448  Weight: 134 lb (60.782 kg) 133 lb (60.328 kg) 132 lb 8 oz (60.102 kg)    Intake/Output Summary (Last 24 hours) at 02/06/15 0800 Last data filed at 02/05/15 2200  Gross per 24 hour  Intake    480 ml  Output      0 ml  Net    480 ml    Telemetry: NSR  Exam:  General: NAD  HEENT: sclera clear  Resp: CTAB  Cardiac: RRR, 2/6 systolic murmur at apex, no jvd  GI: abdomen soft, nt, nd  MSK: no LE edema  Neuro: no focal deficits  Psych: appropriate affect  Lab Results:  Basic Metabolic Panel:  Recent Labs Lab 02/05/15 1247 02/06/15 0446  NA 138 139  K 3.7 3.9  CL 105 107  CO2 26 26  GLUCOSE 133* 95  BUN 16 21*  CREATININE 1.06 1.05  CALCIUM 8.7* 8.7*    Liver Function Tests:  Recent Labs Lab 02/06/15 0446  AST 15  ALT 13*  ALKPHOS 52  BILITOT 0.4  PROT 6.0*  ALBUMIN 3.0*    CBC:  Recent Labs Lab 02/05/15 1247  WBC 10.2  HGB 12.4*  HCT 38.0*  MCV 89.2  PLT 240    Cardiac Enzymes:  Recent Labs Lab 02/05/15 1710 02/05/15 2301 02/06/15 0446  TROPONINI <0.03 <0.03 <0.03    BNP: No results for input(s): PROBNP in the last 8760 hours.  Coagulation:  Recent Labs Lab 02/05/15 1710  INR 1.00    ECG:   Medications:   Scheduled Medications: . aspirin  81 mg Oral Daily  . bisoprolol  7.5 mg Oral Daily  . enoxaparin (LOVENOX) injection  40 mg Subcutaneous Q24H  .  ezetimibe-simvastatin  1 tablet Oral QHS  . furosemide  20 mg Oral Daily  . lisinopril  2.5 mg Oral Daily  . omega-3 acid ethyl esters  1 g Oral Daily  . sodium chloride  3 mL Intravenous Q12H  . tamsulosin  0.4 mg Oral Daily  . ticagrelor  90 mg Oral BID     Infusions:     PRN Medications:  sodium chloride, acetaminophen, ALPRAZolam, nitroGLYCERIN, ondansetron (ZOFRAN) IV, sodium chloride, zolpidem     Assessment/Plan    1.Chest pain - hx of CAD with prior stent in 2015. - admitted with atypical chest pain. No reccurent symptoms overnight or this AM.  Trop neg x3, EKG chronic ST/T changes primarily in inferior leads.  - H&P reviewed, plan per Dr Stanford Breed is for outpatient nuclear stress if ruled out for ACS. I agree with this plan, will plan for discharge, notes indicate an outpatient myoview has been scheudled already for 02/13/15 and outpatient f/u 02/18/15 - start emperic H2 blocker   2. AAA - has follow up in [facility] pending   Discharge home today.   Carlyle Dolly, M.D.

## 2015-02-06 NOTE — Discharge Summary (Signed)
Discharge Summary   Patient ID: Erik [name],  MRN: [id number], DOB/AGE: 1940/06/23 74 y.o.  Admit date: 02/05/2015 Discharge date: 02/06/2015  Primary Care Provider: [name] Primary Cardiologist: Dr Aundra Dubin VVS: Dr Scot Dock  Discharge Diagnoses Principal Problem:   Chest pain with moderate risk for cardiac etiology Active Problems:   Hyperlipidemia   COPD, mild (HCC)   Prediabetes   HTN (hypertension)   AAA (abdominal aortic aneurysm) without rupture (HCC)   Precordial chest pain   Allergies Allergies  Allergen Reactions  . Codeine Swelling    REACTION: Throat swells    Hospital Course  The patient is a 74 year old male with past medical history of CAD s/p PCI in 2015, AAA 5.3 cm in October 2016 undergoing evaluation for stent repair at [facility] HTN, HLD, COPD, SEM and CEA. He apparently had previous history of LV dysfunction with EF down to 25%, however this has improved on the last echocardiogram in September 2015 to 55%. Patient presented to [facility] on 02/05/2015 with substernal chest pain started around 11 AM. The characteristic of the pain was sharp, 7 out of 10. He denies any obvious exacerbating factors such as deep inspiration. He denies any shortness of breath or vomiting, but does have nausea. Interestingly enough, prior to his PCI in 2015, his symptom was shortness of breath but no chest pain. Initial EKG showed normal sinus rhythm with nonspecific ST changes. His symptom was very atypical. He was admitted to cardiology service for chest pain rule out and the serial enzyme.  Overnight, his serial troponin was negative. TSH was negative. Hemoglobin A1c was borderline elevated at 5.8. Otherwise, patient denies any significant chest discomfort or shortness of breath. We have started a trial of empiric H2 blocker. Otherwise he is deemed stable for discharge from cardiology perspective. Outpatient nuclear stress test has been arranged along with  outpatient follow-up.   Discharge Vitals Blood pressure 135/54, pulse 54, temperature 98.4 F (36.9 C), temperature source Oral, resp. rate 17, height 5\' 6"  (1.676 m), weight 132 lb 8 oz (60.102 kg), SpO2 97 %.  Filed Weights   02/05/15 1246 02/05/15 1647 02/06/15 0448  Weight: 134 lb (60.782 kg) 133 lb (60.328 kg) 132 lb 8 oz (60.102 kg)    Labs  CBC  Recent Labs  02/05/15 1247  WBC 10.2  HGB 12.4*  HCT 38.0*  MCV 89.2  PLT [id number]   Basic Metabolic Panel  Recent Labs  02/05/15 1247 02/06/15 0446  NA 138 139  K 3.7 3.9  CL 105 107  CO2 26 26  GLUCOSE 133* 95  BUN 16 21*  CREATININE 1.06 1.05  CALCIUM 8.7* 8.7*   Liver Function Tests  Recent Labs  02/06/15 0446  AST 15  ALT 13*  ALKPHOS 52  BILITOT 0.4  PROT 6.0*  ALBUMIN 3.0*   Cardiac Enzymes  Recent Labs  02/05/15 1710 02/05/15 2301 02/06/15 0446  TROPONINI <0.03 <0.03 <0.03   Hemoglobin A1C  Recent Labs  02/05/15 1710  HGBA1C 5.8*   Fasting Lipid Panel  Recent Labs  02/06/15 0446  CHOL 120  HDL 44  LDLCALC 57  TRIG 96  CHOLHDL 2.7   Thyroid Function Tests  Recent Labs  02/05/15 1710  TSH 1.178    Disposition  Pt is being discharged home today in good condition.  Follow-up Plans & Appointments      Follow-up Information    Follow up with [facility] On 02/13/2015.   Specialty:  Cardiology   Why:  Stress Test on 02/13/2015 at 12:30PM. Nothing to eat or drink after midnight on the day of your test except sips of water with medication.   Contact information:   [address] [place] [phone]      Follow up with Ermalinda Barrios, PA-C On 02/18/2015.   Specialty:  Cardiology   Why:  Cardiology Follow-Up on 02/18/2015 at 12:45PM.   Contact information:   [address] [place] [address] [phone]       Follow up with [name], MD.   Specialty:  Family Medicine   Why:  your hgb A1C  was borderline elevated at 5.8 into prediabetes range, cut off for normal was 5.7. Encourage diet and exercise. Please followup with your primary care physician.   Contact information:   [place] [address] [phone]       Discharge Medications    Medication List    TAKE these medications        aspirin 81 MG tablet  Take 81 mg by mouth daily.     bisoprolol 5 MG tablet  Commonly known as:  ZEBETA  Take 1.5 tablets (7.5 mg total) by mouth daily.     BRILINTA 90 MG Tabs tablet  Generic drug:  ticagrelor  TAKE 1 TABLET (90 MG TOTAL) BY MOUTH 2 (TWO) TIMES DAILY.     ezetimibe-simvastatin 10-40 MG tablet  Commonly known as:  VYTORIN  Take 1 tablet by mouth at bedtime.     famotidine 20 MG tablet  Commonly known as:  PEPCID  Take 1 tablet (20 mg total) by mouth 2 (two) times daily.     fish oil-omega-3 fatty acids 1000 MG capsule  Take 1 g by mouth daily.     furosemide 20 MG tablet  Commonly known as:  LASIX  TAKE 1 TABLET (20 MG TOTAL) BY MOUTH DAILY.     ibuprofen 200 MG tablet  Commonly known as:  ADVIL,MOTRIN  Take 200 mg by mouth every 6 (six) hours as needed for moderate pain.     lisinopril 5 MG tablet  Commonly known as:  PRINIVIL,ZESTRIL  Take 0.5 tablets (2.5 mg total) by mouth daily.     nitroGLYCERIN 0.4 MG SL tablet  Commonly known as:  NITROSTAT  Place 1 tablet (0.4 mg total) under the tongue every 5 (five) minutes x 3 doses as needed for chest pain.     tamsulosin 0.4 MG Caps capsule  Commonly known as:  FLOMAX  Take 1 capsule (0.4 mg total) by mouth daily.        Duration of Discharge Encounter   Greater than 30 minutes including physician time.  Hilbert Corrigan PA-C Pager: [phone] 02/06/2015, 10:14 AM

## 2015-02-11 ENCOUNTER — Telehealth (HOSPITAL_COMMUNITY): Payer: Self-pay | Admitting: *Deleted

## 2015-02-11 ENCOUNTER — Telehealth: Payer: Self-pay | Admitting: Cardiology

## 2015-02-11 NOTE — Telephone Encounter (Signed)
Patient given detailed instructions per Myocardial Perfusion Study Information Sheet for the test on 02/13/15 at 1230. Patient notified to arrive 15 minutes early and that it is imperative to arrive on time for appointment to keep from having the test rescheduled.  If you need to cancel or reschedule your appointment, please call the office within 24 hours of your appointment. Failure to do so may result in a cancellation of your appointment, and a $50 no show fee. Patient verbalized understanding.Jasilyn Holderman, Ranae Palms

## 2015-02-11 NOTE — Telephone Encounter (Signed)
D/C phone call.Marland KitchenMarland KitchenAppt on 02/28/15 at 12:45pm w/ Estella Husk at the [employer] .Marland Kitchen   Thanks

## 2015-02-12 NOTE — Telephone Encounter (Signed)
Patient contacted regarding dischargefrom [facility] on 02/06/15.  Patient understands to follow up with provider Amie Portland on 02/28/15 at 12:45 pm at [facility]. Patient understands discharge instructions? yes Patient understands medications and regiment? yes Patient understands to bring all medications to this visit? yes

## 2015-02-13 ENCOUNTER — Ambulatory Visit (HOSPITAL_COMMUNITY): Payer: Medicare Other | Attending: Internal Medicine

## 2015-02-13 DIAGNOSIS — R079 Chest pain, unspecified: Secondary | ICD-10-CM | POA: Diagnosis not present

## 2015-02-13 DIAGNOSIS — I447 Left bundle-branch block, unspecified: Secondary | ICD-10-CM | POA: Insufficient documentation

## 2015-02-13 DIAGNOSIS — I10 Essential (primary) hypertension: Secondary | ICD-10-CM | POA: Diagnosis not present

## 2015-02-13 MED ORDER — REGADENOSON 0.4 MG/5ML IV SOLN
0.4000 mg | Freq: Once | INTRAVENOUS | Status: AC
  Administered 2015-02-13: 0.4 mg via INTRAVENOUS

## 2015-02-13 MED ORDER — TECHNETIUM TC 99M SESTAMIBI GENERIC - CARDIOLITE
32.0000 | Freq: Once | INTRAVENOUS | Status: AC | PRN
  Administered 2015-02-13: 32 via INTRAVENOUS

## 2015-02-13 MED ORDER — TECHNETIUM TC 99M SESTAMIBI GENERIC - CARDIOLITE
10.7000 | Freq: Once | INTRAVENOUS | Status: AC | PRN
  Administered 2015-02-13: 11 via INTRAVENOUS

## 2015-02-18 ENCOUNTER — Encounter: Payer: Self-pay | Admitting: Physician Assistant

## 2015-02-18 ENCOUNTER — Ambulatory Visit (INDEPENDENT_AMBULATORY_CARE_PROVIDER_SITE_OTHER): Payer: Medicare Other | Admitting: Physician Assistant

## 2015-02-18 ENCOUNTER — Telehealth: Payer: Self-pay

## 2015-02-18 VITALS — BP 140/50 | HR 56 | Ht 66.0 in | Wt 134.0 lb

## 2015-02-18 DIAGNOSIS — I2510 Atherosclerotic heart disease of native coronary artery without angina pectoris: Secondary | ICD-10-CM

## 2015-02-18 DIAGNOSIS — I10 Essential (primary) hypertension: Secondary | ICD-10-CM | POA: Diagnosis not present

## 2015-02-18 DIAGNOSIS — I6523 Occlusion and stenosis of bilateral carotid arteries: Secondary | ICD-10-CM

## 2015-02-18 DIAGNOSIS — Z9861 Coronary angioplasty status: Secondary | ICD-10-CM | POA: Diagnosis not present

## 2015-02-18 DIAGNOSIS — I714 Abdominal aortic aneurysm, without rupture, unspecified: Secondary | ICD-10-CM

## 2015-02-18 LAB — MYOCARDIAL PERFUSION IMAGING
CHL CUP NUCLEAR SDS: 3
CHL CUP NUCLEAR SRS: 2
CHL CUP RESTING HR STRESS: 48 {beats}/min
CSEPPHR: 80 {beats}/min
LV sys vol: 73 mL
LVDIAVOL: 158 mL
NUC STRESS TID: 0.97
RATE: 0.27
SSS: 5

## 2015-02-18 NOTE — Assessment & Plan Note (Signed)
Blood pressure controlled. 

## 2015-02-18 NOTE — Patient Instructions (Signed)
Medication Instructions:  Your physician recommends that you continue on your current medications as directed. Please refer to the Current Medication list given to you today.   Labwork: None ordered  Testing/Procedures: None ordered  Follow-Up: Your physician wants you to follow-up in: 6 months with Dr.McLean You will receive a reminder letter in the mail two months in advance. If you don't receive a letter, please call our office to schedule the follow-up appointment.   Any Other Special Instructions Will Be Listed Below (If Applicable). We will fax a copy of your Lexiscan(stress test) to [facility] this afternoon     If you need a refill on your cardiac medications before your next appointment, please call your pharmacy.

## 2015-02-18 NOTE — Telephone Encounter (Signed)
Pt aware of Lexiscan results.  Nuclear stress EF: 54%.  The left ventricular ejection fraction is mildly decreased (45-54%).  There was no ST segment deviation noted during stress.  The study is normal. No evidence of ischemia. Normal LV function .  This is a low risk study. results faxed to [facility] attn: Dr.Farber fax [phone]  Pt verbalized understanding

## 2015-02-18 NOTE — Assessment & Plan Note (Addendum)
Going to [facility] for further evaluation of his AAA tomorrow. We'll fax his recent nuclear stress test.

## 2015-02-18 NOTE — Progress Notes (Signed)
Cardiology Office Note   Date:  02/18/2015   ID:  Erik [name], DOB 10-Sep-1940, MRN [id number]  PCP:  [name], MD  Cardiologist: Dr. Aundra Dubin  Chief Complaint: Post hospital follow-up    History of Present Illness: Erik [name] is a 74 y.o. male who presents for post hospital follow-up. He has history of COPD, hypertension, PAD with bilateral CEAs, ischemic cardiomyopathy, CAD status post PTCA and stenting of a proximal LAD and proximal RCA in [id number], chronic systolic heart failure and prior tobacco abuse. EF 20-25% on echo 08/2013 with severely dilated LV with mild LVH RV normal. EF on echo in 11/2014 improved to 55-60% with grade 1 diastolic dysfunction LVH and mild apical hypokinesis.  Patient was recently in the hospital with chest pain. Troponin was negative Hemoglobin A1c was slightly elevated at 5.8 TSH was normal. He was set up for a nuclear stress test as an outpatient and this was read as a low risk study 54% no ST segment deviation during stress. No evidence of ischemia. Normal LV function. Patient says since he was started on Pepcid he is had no further chest pain. He is scheduled to go to [facility] for an evaluation of an abdominal aortic aneurysm repair. He wants a copy of his stress test to take along.    Past Medical History  Diagnosis Date  . Coronary artery disease     a. LHC (08/2013): Lmain: short 30% distal, LAD: sml D1 & D2, 70% ostial D1, 95-99% LAD stenosis prox D2 LCx: sml/mod ramus subtot. occluded, 40% ostial set off lg OM1, 40% AV LCx after OM1, RCA: 90% prox (DES to RCA and prox LAD)  . Hyperlipidemia   . COPD (chronic obstructive pulmonary disease) ([place])   . BPH (benign prostatic hypertrophy)   . History of colonic polyps   . Hypertension   . Heart murmur   . Collagen vascular disease ([place])   . Chronic systolic heart failure (H[facility])     a. EF 20-25%, mild LVH, mod HK, mid apicalanteroseptal myocardium, mild MR, LA mod dilated  . Ischemic  cardiomyopathy   . Carotid artery stenosis     a. Bilateral CEA    Past Surgical History  Procedure Laterality Date  . Cholecystectomy      Gall Bladder  . Hernia repair    . Carotid endarterectomy  04/17/2008    right  . Carotid endarterectomy  05/30/08    Left  . Cardiac stents  09-2013  . Percutaneous stent intervention  09/11/2013    Procedure: PERCUTANEOUS STENT INTERVENTION;  Surgeon: Troy Sine, MD;  Location: [facility] CATH LAB;  Service: Cardiovascular;;  DES Prox RCA 3.5x15 xience DES Prox LAD 3.0x33 xience  . Coronary angiogram  09/11/2013    Procedure: CORONARY ANGIOGRAM;  Surgeon: Troy Sine, MD;  Location: [facility] CATH LAB;  Service: Cardiovascular;;     Current Outpatient Prescriptions  Medication Sig Dispense Refill  . aspirin 81 MG tablet Take 81 mg by mouth daily.      . bisoprolol (ZEBETA) 5 MG tablet Take 1.5 tablets (7.5 mg total) by mouth daily. 90 tablet 2  . BRILINTA 90 MG TABS tablet TAKE 1 TABLET (90 MG TOTAL) BY MOUTH 2 (TWO) TIMES DAILY. 60 tablet 6  . ezetimibe-simvastatin (VYTORIN) 10-40 MG per tablet Take 1 tablet by mouth at bedtime. 30 tablet 9  . famotidine (PEPCID) 20 MG tablet Take 1 tablet (20 mg total) by mouth 2 (two) times daily. 60 tablet 3  .  fish oil-omega-3 fatty acids 1000 MG capsule Take 1 g by mouth daily.      . furosemide (LASIX) 20 MG tablet TAKE 1 TABLET (20 MG TOTAL) BY MOUTH DAILY. 30 tablet 6  . ibuprofen (ADVIL,MOTRIN) 200 MG tablet Take 200 mg by mouth every 6 (six) hours as needed for moderate pain.     Marland Kitchen lisinopril (PRINIVIL,ZESTRIL) 5 MG tablet Take 0.5 tablets (2.5 mg total) by mouth daily. 30 tablet 6  . nitroGLYCERIN (NITROSTAT) 0.4 MG SL tablet Place 1 tablet (0.4 mg total) under the tongue every 5 (five) minutes x 3 doses as needed for chest pain. 30 tablet 12  . tamsulosin (FLOMAX) 0.4 MG CAPS capsule Take 1 capsule (0.4 mg total) by mouth daily. 90 capsule 1   No current facility-administered medications for this visit.     Allergies:   Codeine    Social History:  The patient  reports that he quit smoking about 17 months ago. His smoking use included Cigarettes. He has a .75 pack-year smoking history. He quit smokeless tobacco use about 17 months ago. He reports that he does not drink alcohol or use illicit drugs.   Family History:  The patient's   family history includes Cancer in his father; Heart attack in his brother; Heart disease in his brother; Hyperlipidemia in his brother, brother, brother, and brother; Hypertension in his mother; Multiple sclerosis in his brother.    ROS:  Please see the history of present illness.   Otherwise, review of systems are positive for none.   All other systems are reviewed and negative.    PHYSICAL EXAM: VS:  BP 140/50 mmHg  Pulse 56  Ht 5\' 6"  (1.676 m)  Wt 134 lb (60.782 kg)  BMI 21.64 kg/m2  SpO2 98% , BMI Body mass index is 21.64 kg/(m^2). GEN: Well nourished, well developed, in no acute distress Neck: no JVD, HJR, carotid bruits, or masses Cardiac:  RRR; no murmurs,gallop, rubs, thrill or heave,  Respiratory:  clear to auscultation bilaterally, normal work of breathing GI: soft, nontender, nondistended, + BS MS: no deformity or atrophy Extremities: without cyanosis, clubbing, edema, good distal pulses bilaterally.  Skin: warm and dry, no rash Neuro:  Strength and sensation are intact    EKG:  EKG is not ordered today.    Recent Labs: 02/05/2015: B Natriuretic Peptide 205.2*; Hemoglobin 12.4*; Platelets 240; TSH 1.178 02/06/2015: ALT 13*; BUN 21*; Creatinine, Ser 1.05; Potassium 3.9; Sodium 139    Lipid Panel    Component Value Date/Time   CHOL 120 02/06/2015 0446   TRIG 96 02/06/2015 0446   HDL 44 02/06/2015 0446   CHOLHDL 2.7 02/06/2015 0446   VLDL 19 02/06/2015 0446   LDLCALC 57 02/06/2015 0446   LDLDIRECT 176.9 05/09/2008 0917      Wt Readings from Last 3 Encounters:  02/18/15 134 lb (60.782 kg)  02/13/15 133 lb (60.328 kg)   02/06/15 132 lb 8 oz (60.102 kg)      Other studies Reviewed: Additional studies/ records that were reviewed today include and review of the records demonstrates:  2-D echo 11/16/13 Study Conclusions  - Left ventricle: The cavity size was normal. Wall thickness was   increased in a pattern of mild LVH. Mild apical hypokinesis.   Septal-lateral dyssynchrony suggestive of bundle branch block.   Systolic function was normal. The estimated ejection fraction was   in the range of 55% to 60%. Doppler parameters are consistent   with abnormal left ventricular relaxation (grade  1 diastolic   dysfunction). - Aortic valve: There was no stenosis. There was mild   regurgitation. - Mitral valve: Moderately calcified annulus. Mildly calcified   leaflets . There was trivial regurgitation. - Left atrium: The atrium was mildly dilated. - Right ventricle: The cavity size was normal. Systolic function   was normal. - Tricuspid valve: Peak RV-RA gradient (S): 23 mm Hg. - Pulmonary arteries: PA peak pressure: 26 mm Hg (S). - Inferior vena cava: The vessel was normal in size. The   respirophasic diameter changes were in the normal range (>= 50%),   consistent with normal central venous pressure.  Impressions:  - Normal LV size and systolic function, EF 0000000. Mild LV   hypertrophy. Mild apical hypokinesis. Septal-lateral dyssynchrony   suggestive of bundle branch block. Mild AI. Normal RV size and   systolic function. Study Conclusions  - Left ventricle: The cavity size was normal. Wall thickness was   increased in a pattern of mild LVH. Mild apical hypokinesis.   Septal-lateral dyssynchrony suggestive of bundle branch block.   Systolic function was normal. The estimated ejection fraction was   in the range of 55% to 60%. Doppler parameters are consistent   with abnormal left ventricular relaxation (grade 1 diastolic   dysfunction). - Aortic valve: There was no stenosis. There was mild    regurgitation. - Mitral valve: Moderately calcified annulus. Mildly calcified   leaflets . There was trivial regurgitation. - Left atrium: The atrium was mildly dilated. - Right ventricle: The cavity size was normal. Systolic function   was normal. - Tricuspid valve: Peak RV-RA gradient (S): 23 mm Hg. - Pulmonary arteries: PA peak pressure: 26 mm Hg (S). - Inferior vena cava: The vessel was normal in size. The   respirophasic diameter changes were in the normal range (>= 50%),   consistent with normal central venous pressure.  Impressions:  - Normal LV size and systolic function, EF 0000000. Mild LV   hypertrophy. Mild apical hypokinesis. Septal-lateral dyssynchrony   suggestive of bundle branch block. Mild AI. Normal RV size and   systolic function.  Lexi scan Myoview 02/13/15 Study Highlights      Nuclear stress EF: 54%.  The left ventricular ejection fraction is mildly decreased (45-54%).  There was no ST segment deviation noted during stress.  The study is normal. No evidence of ischemia. Normal LV function .  This is a low risk study.                ASSESSMENT AND PLAN:   Sumner Boast, PA-C  02/18/2015 12:57 PM    [facility] [address], [place], [place]  [address] Phone: [phone]; Fax: [phone]

## 2015-02-18 NOTE — Assessment & Plan Note (Addendum)
Recent hospitalization with chest pain MI ruled out with negative troponins. Low risk Lexi scan Myoview 02/13/15 with normal LV function EF 54%.

## 2015-02-19 DIAGNOSIS — Z955 Presence of coronary angioplasty implant and graft: Secondary | ICD-10-CM | POA: Diagnosis not present

## 2015-02-19 DIAGNOSIS — Z87891 Personal history of nicotine dependence: Secondary | ICD-10-CM | POA: Diagnosis not present

## 2015-02-19 DIAGNOSIS — I10 Essential (primary) hypertension: Secondary | ICD-10-CM | POA: Diagnosis not present

## 2015-02-19 DIAGNOSIS — J449 Chronic obstructive pulmonary disease, unspecified: Secondary | ICD-10-CM | POA: Diagnosis not present

## 2015-02-19 DIAGNOSIS — I714 Abdominal aortic aneurysm, without rupture: Secondary | ICD-10-CM | POA: Diagnosis not present

## 2015-02-19 DIAGNOSIS — I2510 Atherosclerotic heart disease of native coronary artery without angina pectoris: Secondary | ICD-10-CM | POA: Diagnosis not present

## 2015-02-19 DIAGNOSIS — E785 Hyperlipidemia, unspecified: Secondary | ICD-10-CM | POA: Diagnosis not present

## 2015-02-19 DIAGNOSIS — I739 Peripheral vascular disease, unspecified: Secondary | ICD-10-CM | POA: Diagnosis not present

## 2015-02-21 DIAGNOSIS — I6522 Occlusion and stenosis of left carotid artery: Secondary | ICD-10-CM | POA: Diagnosis not present

## 2015-02-21 DIAGNOSIS — I714 Abdominal aortic aneurysm, without rupture: Secondary | ICD-10-CM | POA: Diagnosis not present

## 2015-02-21 DIAGNOSIS — I70208 Unspecified atherosclerosis of native arteries of extremities, other extremity: Secondary | ICD-10-CM | POA: Diagnosis not present

## 2015-02-21 DIAGNOSIS — R911 Solitary pulmonary nodule: Secondary | ICD-10-CM | POA: Diagnosis not present

## 2015-04-03 ENCOUNTER — Telehealth (HOSPITAL_COMMUNITY): Payer: Self-pay | Admitting: *Deleted

## 2015-04-03 NOTE — Telephone Encounter (Signed)
Pt is on ASA and Brilanta and needs to be off for a tooth extraction, will discuss w/Dr Aundra Dubin and call her back tomorrow

## 2015-04-03 NOTE — Telephone Encounter (Signed)
Ok to stop Brilinta for tooth extraction.  He needs to stop Brilinta anyway and start back on Plavix 75 mg daily.  Can start back on Plavix rather than Brilinta after tooth extraction.

## 2015-04-04 NOTE — Telephone Encounter (Signed)
Stop Brilinta 5 days prior, restart on Plavix 75 mg daily rather than Brilinta after procedure.  Stop aspirin only if dentist wants it stopped.  I'm fine with him continuing it.

## 2015-04-04 NOTE — Telephone Encounter (Signed)
Dr Aundra Dubin how long prior to extraction should pt hold Brilinta?  Should he hold ASA or continue?

## 2015-04-05 ENCOUNTER — Other Ambulatory Visit: Payer: Self-pay | Admitting: Family Medicine

## 2015-04-07 MED ORDER — CLOPIDOGREL BISULFATE 75 MG PO TABS: 75.0000 mg | ORAL_TABLET | Freq: Every day | ORAL | Status: DC

## 2015-04-07 NOTE — Telephone Encounter (Signed)
Spoke w/pt, he is aware and agreeable, rx for plavix sent in

## 2015-04-08 ENCOUNTER — Other Ambulatory Visit: Payer: Self-pay | Admitting: *Deleted

## 2015-04-08 NOTE — Telephone Encounter (Signed)
Looks like this was prescribed by Almyra Deforest. Pharmacy/patient requesting a ninety day supply. Ok to refill? Please advise. Thanks, MI

## 2015-04-11 ENCOUNTER — Other Ambulatory Visit (HOSPITAL_COMMUNITY): Payer: Self-pay | Admitting: *Deleted

## 2015-04-15 ENCOUNTER — Telehealth: Payer: Self-pay | Admitting: Family Medicine

## 2015-04-15 ENCOUNTER — Other Ambulatory Visit (INDEPENDENT_AMBULATORY_CARE_PROVIDER_SITE_OTHER): Payer: Medicare Other

## 2015-04-15 DIAGNOSIS — R7303 Prediabetes: Secondary | ICD-10-CM | POA: Diagnosis not present

## 2015-04-15 DIAGNOSIS — E785 Hyperlipidemia, unspecified: Secondary | ICD-10-CM | POA: Diagnosis not present

## 2015-04-15 LAB — LIPID PANEL
CHOLESTEROL: 101 mg/dL (ref 0–200)
HDL: 44 mg/dL (ref >39.00)
LDL CALC: 42 mg/dL (ref 0–99)
NONHDL: 56.96
Total CHOL/HDL Ratio: 2
Triglycerides: 73 mg/dL (ref 0.0–149.0)
VLDL: 14.6 mg/dL (ref 0.0–40.0)

## 2015-04-15 LAB — COMPREHENSIVE METABOLIC PANEL
ALBUMIN: 3.7 g/dL (ref 3.5–5.2)
ALT: 10 U/L (ref 0–53)
AST: 13 U/L (ref 0–37)
Alkaline Phosphatase: 49 U/L (ref 39–117)
BUN: 16 mg/dL (ref 6–23)
CHLORIDE: 108 meq/L (ref 96–112)
CO2: 29 meq/L (ref 19–32)
CREATININE: 0.84 mg/dL (ref 0.40–1.50)
Calcium: 8.9 mg/dL (ref 8.4–10.5)
GFR: 94.98 mL/min (ref >60.00)
Glucose, Bld: 92 mg/dL (ref 70–99)
POTASSIUM: 4.4 meq/L (ref 3.5–5.1)
SODIUM: 141 meq/L (ref 135–145)
Total Bilirubin: 0.4 mg/dL (ref 0.2–1.2)
Total Protein: 6.8 g/dL (ref 6.0–8.3)

## 2015-04-15 LAB — HEMOGLOBIN A1C: HEMOGLOBIN A1C: 5.8 % (ref 4.6–6.5)

## 2015-04-15 NOTE — Telephone Encounter (Signed)
-----   Message from Ellamae Sia sent at 04/14/2015  5:40 PM EST ----- Regarding: Lab orders for Tuesday, 1.31.17 Patient is scheduled for CPX labs, please order future labs, Thanks , Karna Christmas

## 2015-04-18 ENCOUNTER — Encounter: Payer: Self-pay | Admitting: Family Medicine

## 2015-04-18 ENCOUNTER — Ambulatory Visit (INDEPENDENT_AMBULATORY_CARE_PROVIDER_SITE_OTHER): Payer: Medicare Other | Admitting: Family Medicine

## 2015-04-18 VITALS — BP 166/56 | HR 57 | Temp 98.3°F | Ht 66.25 in | Wt 137.5 lb

## 2015-04-18 DIAGNOSIS — R7303 Prediabetes: Secondary | ICD-10-CM

## 2015-04-18 DIAGNOSIS — E785 Hyperlipidemia, unspecified: Secondary | ICD-10-CM | POA: Diagnosis not present

## 2015-04-18 DIAGNOSIS — J449 Chronic obstructive pulmonary disease, unspecified: Secondary | ICD-10-CM

## 2015-04-18 DIAGNOSIS — I714 Abdominal aortic aneurysm, without rupture, unspecified: Secondary | ICD-10-CM

## 2015-04-18 DIAGNOSIS — I10 Essential (primary) hypertension: Secondary | ICD-10-CM | POA: Diagnosis not present

## 2015-04-18 DIAGNOSIS — M542 Cervicalgia: Secondary | ICD-10-CM | POA: Insufficient documentation

## 2015-04-18 DIAGNOSIS — Z7189 Other specified counseling: Secondary | ICD-10-CM

## 2015-04-18 NOTE — Assessment & Plan Note (Signed)
Planning upcoming stent placement.

## 2015-04-18 NOTE — Patient Instructions (Addendum)
Stop ibuprofen and try tylenol for neck pain. Start home physical therapy gentle stretching.  Call if neck pain is not improving.  Return to lisinopril 5 mg daily or try trial of 5 mg/ 2.5 mg every other day. Follow BP closely.

## 2015-04-18 NOTE — Assessment & Plan Note (Signed)
Elevated on lower dose of lisinopril  And taking ibuprofen gfor neck pain. Try tylenol for neck pain instead, if BP not coming down return to 5 mg daily of lisinopril or try alternating dose 2.5/5 mg.

## 2015-04-18 NOTE — Progress Notes (Signed)
Pre visit review using our clinic review tool, if applicable. No additional management support is needed unless otherwise documented below in the visit note. 

## 2015-04-18 NOTE — Assessment & Plan Note (Signed)
MSK strain versus arthritis. Treat with tylenol and info on home PT given.

## 2015-04-18 NOTE — Progress Notes (Signed)
I have personally reviewed the Medicare Annual Wellness questionnaire and have noted 1. The patient's medical and social history 2. Their use of alcohol, tobacco or illicit drugs 3. Their current medications and supplements 4. The patient's functional ability including ADL's, fall risks, home safety risks and hearing or visual             impairment. 5. Diet and physical activities 6. Evidence for depression or mood disorders 7.         Updated provider list Cognitive evaluation was performed and recorded on pt medicare questionnaire form. The patients weight, height, BMI and visual acuity have been recorded in the chart  I have made referrals, counseling and provided education to the patient based review of the above and I have provided the pt with a written personalized care plan for preventive services.   Doing well overall.  Elevated Cholesterol: On Vytorin.. At Goal LDL<70  Lab Results  Component Value Date   CHOL 101 04/15/2015   HDL 44.00 04/15/2015   LDLCALC 42 04/15/2015   LDLDIRECT 176.9 05/09/2008   TRIG 73.0 04/15/2015   CHOLHDL 2 04/15/2015  Using medications without problems:None  Muscle aches: None  Diet compliance: Fair  Exercise:working in yard. Walking dogs. Other complaints:  B carotid stenosis, claudication....followed by Vascular MD, last carotid was 12/2014 stable  AAA: last eval 12/2014.Marland Kitchen Referred to [facility] for stent placement  CAD, CHF followed by cards.Claiborne Billings S/ P cath 2015. cardiac rehab.  BP Poorly  controlled. On bisoprolol, furosemide, lisinopril (cut in half for low BP 115/40)  Since then BP has been running high. BP Readings from Last 3 Encounters:  04/18/15 166/56  02/18/15 140/50  02/06/15 135/54  02/05/2015: ER eval for chest pain. Felt not cardiac source after full eval. Dx with heartburn: Started on pepcid. No further issues.  BPH: stable control on flomax.  Prediabetes:Stable control, almost resolved with lifestyle changes.  Lab  Results  Component Value Date   HGBA1C 5.8 04/15/2015   COPD: QUIT 09/09/2013, only did it off and on. Feels like breathing better.No acute bronchitis issues, no cough. No wheeze. Has smoked 43 years... 5 mm right upper lobe pulmonary nodule on CT in 09/10/2013. Needs repeat.   Review of Systems  Constitutional: Negative for fever, fatigue and unexpected weight change.  HENT: Negative for ear pain, congestion, sore throat, rhinorrhea, trouble swallowing and postnasal drip.  Eyes: Negative for pain.  Respiratory: Negative for cough, shortness of breath and wheezing.  Cardiovascular: Negative for chest pain, palpitations and leg swelling.  Gastrointestinal: Negative for nausea, abdominal pain, diarrhea, constipation and blood in stool.  Genitourinary: Negative for dysuria, urgency, hematuria, discharge, penile swelling, scrotal swelling, difficulty urinating, penile pain and testicular pain.  Skin: Negative for rash.  Neurological: Negative for syncope, weakness, light-headedness, numbness and headaches.  Psychiatric/Behavioral: Negative for behavioral problems and dysphoric mood. The patient is not nervous/anxious.   History   Social History  . Marital Status: Married    Spouse Name: N/A    Number of Children: N/A  . Years of Education: N/A   Occupational History  . Not on file.   Social History Main Topics  . Smoking status: Current Every Day Smoker -- 1.5 packs/day for .5 years    Types: Cigarettes  . Smokeless tobacco: Not on file  . Alcohol Use: No  . Drug Use: No  . Sexually Active:    Other Topics Concern  . Not on file   Social History Narrative   No  living will, no HCPOA. (reviewed 2013)    Objective:   Physical Exam  Constitutional: He appears well-developed and well-nourished. Non-toxic appearance. He does not appear ill. No distress.   HENT:  Head: Normocephalic and atraumatic.  Right Ear: Hearing, tympanic membrane, external ear and ear canal normal.  Left Ear: Hearing, tympanic membrane, external ear and ear canal normal.  Nose: Nose normal.  Mouth/Throat: Uvula is midline, oropharynx is clear and moist and mucous membranes are normal.    Eyes: Conjunctivae, EOM and lids are normal. Pupils are equal, round, and reactive to light. No foreign bodies found.  Neck: Trachea normal, normal range of motion and phonation normal. Neck supple. Carotid bruit is not present. No mass and no thyromegaly present.  Cardiovascular: Normal rate, regular rhythm, S1 normal, S2 normal, intact distal pulses and normal pulses. Exam reveals no gallop.  No murmur heard.  Pulmonary/Chest: Breath sounds normal. He has no wheezes. He has no rhonchi. He has no rales.  Abdominal: Soft. Normal appearance and bowel sounds are normal. There is no hepatosplenomegaly. There is no tenderness. There is no rebound, no guarding and no CVA tenderness. No hernia. Hernia confirmed negative in the right inguinal area and confirmed negative in the left inguinal area.  MSK: ttp over right trapezius, decreased ROM of neck, neg spurlings. Lymphadenopathy:  He has no cervical adenopathy.  Right: No inguinal adenopathy present.  Left: No inguinal adenopathy present.  Neurological: He is alert. He has normal strength and normal reflexes. No cranial nerve deficit or sensory deficit. Gait normal.  Skin: Skin is warm, dry and intact. No rash noted.  Psychiatric: He has a normal mood and affect. His speech is normal and behavior is normal. Judgment normal.    Assessment & Plan:   AMW: The patient's preventative maintenance and recommended screening tests for an annual wellness exam were reviewed in full today.  Brought up to date unless services declined.  Counselled on the importance of diet, exercise, and its role in overall health and mortality.   The patient's FH and SH was reviewed, including their home life, tobacco status, and drug and alcohol status.   Td, PNA: Up to date. Shingles:not interested  Flu: not interested  PSA: Hx of BPH on tamulosin. PSA not indicated  Colon: 06/2008 Dr. Sharlett Iles, repeat 10 years hyperplastic polyps  Smoker: spirometry performed 2012.. Breathing better since quit smoking. Ct lung cancer screen: 04/2014 no change in nodules. Recommend yearly low dose screen.

## 2015-04-18 NOTE — Assessment & Plan Note (Signed)
Stable control. 

## 2015-04-18 NOTE — Assessment & Plan Note (Addendum)
Spirometry today showed: no COPD

## 2015-04-18 NOTE — Assessment & Plan Note (Signed)
Well controlled. Continue current medication.  

## 2015-04-18 NOTE — Assessment & Plan Note (Signed)
No HCPOA, no living will, full code (reviewed  2017) HAs info at home he plans on compoleting.

## 2015-05-14 ENCOUNTER — Encounter: Payer: Self-pay | Admitting: Cardiology

## 2015-05-14 DIAGNOSIS — R918 Other nonspecific abnormal finding of lung field: Secondary | ICD-10-CM | POA: Diagnosis not present

## 2015-05-14 DIAGNOSIS — I340 Nonrheumatic mitral (valve) insufficiency: Secondary | ICD-10-CM | POA: Diagnosis not present

## 2015-05-14 DIAGNOSIS — I519 Heart disease, unspecified: Secondary | ICD-10-CM | POA: Diagnosis not present

## 2015-05-14 DIAGNOSIS — I700 Atherosclerosis of aorta: Secondary | ICD-10-CM | POA: Diagnosis not present

## 2015-05-14 DIAGNOSIS — I714 Abdominal aortic aneurysm, without rupture: Secondary | ICD-10-CM | POA: Diagnosis not present

## 2015-05-14 DIAGNOSIS — R0683 Snoring: Secondary | ICD-10-CM | POA: Diagnosis not present

## 2015-05-14 DIAGNOSIS — I255 Ischemic cardiomyopathy: Secondary | ICD-10-CM | POA: Diagnosis not present

## 2015-05-14 DIAGNOSIS — I10 Essential (primary) hypertension: Secondary | ICD-10-CM | POA: Diagnosis not present

## 2015-05-14 DIAGNOSIS — K219 Gastro-esophageal reflux disease without esophagitis: Secondary | ICD-10-CM | POA: Diagnosis not present

## 2015-05-14 DIAGNOSIS — I2510 Atherosclerotic heart disease of native coronary artery without angina pectoris: Secondary | ICD-10-CM | POA: Diagnosis not present

## 2015-05-14 DIAGNOSIS — Z01818 Encounter for other preprocedural examination: Secondary | ICD-10-CM | POA: Diagnosis not present

## 2015-05-14 DIAGNOSIS — J449 Chronic obstructive pulmonary disease, unspecified: Secondary | ICD-10-CM | POA: Diagnosis not present

## 2015-05-14 DIAGNOSIS — I517 Cardiomegaly: Secondary | ICD-10-CM | POA: Diagnosis not present

## 2015-05-16 ENCOUNTER — Telehealth: Payer: Self-pay | Admitting: Cardiology

## 2015-05-16 NOTE — Telephone Encounter (Signed)
Pt is not having any new cp or cardiac symptoms. Pt c/o of HTN issues.  Pt feels his meds need to be adjusted, he could not give me any bp recordings other than one, 155/55. Pt was told to get readings 2 x day at different times with pulse and call back Monday or Tuesday with results, pt agreed to plan. Copy on note faxed to Dr Orland Jarred.

## 2015-05-16 NOTE — Telephone Encounter (Signed)
If no chest pain or new cardiac symptoms, ok to hold Plavix for procedure.

## 2015-05-16 NOTE — Telephone Encounter (Signed)
Sonia Baller- NP called in stating that the pt will be having an Aneursym repair on 3/13 and she wanted to know if it is ok for the pt to hold their Plavix for a week. The doctor performing the surgery is Dr. Mat Carne. Please f/u with her

## 2015-05-16 NOTE — Telephone Encounter (Signed)
Check BP daily, call in results next week and we'll see if BP meds need adjustment.

## 2015-05-16 NOTE — Telephone Encounter (Signed)
LM w/Jenny in Dr. Orland Jarred office that Dr. Aundra Dubin recommendation to take BP daily and to call next week to see if needs adjustment to medications.  Also left detailed message on Mr. Dugdale's VM (verified) to take BP daily with upper arm cuff and call next week to see if he needs to adjust medications.

## 2015-05-16 NOTE — Telephone Encounter (Signed)
Jenny-NP at [facility] for Dr. Mat Carne calling requesting that Erik [name]'s Plavix be held 7 days prior to an endovascular repair on 3/13 at [facility]. Advised will forward to Dr.McLean since he is not in office today and [name] who is covering for him Monday 3/6.Their fax number is [phone] and phone is [phone].

## 2015-05-23 DIAGNOSIS — I714 Abdominal aortic aneurysm, without rupture: Secondary | ICD-10-CM | POA: Diagnosis not present

## 2015-05-25 DIAGNOSIS — R8299 Other abnormal findings in urine: Secondary | ICD-10-CM | POA: Diagnosis present

## 2015-05-25 DIAGNOSIS — Z8679 Personal history of other diseases of the circulatory system: Secondary | ICD-10-CM | POA: Diagnosis not present

## 2015-05-25 DIAGNOSIS — Z87891 Personal history of nicotine dependence: Secondary | ICD-10-CM | POA: Diagnosis not present

## 2015-05-25 DIAGNOSIS — Z7982 Long term (current) use of aspirin: Secondary | ICD-10-CM | POA: Diagnosis not present

## 2015-05-25 DIAGNOSIS — Z885 Allergy status to narcotic agent status: Secondary | ICD-10-CM | POA: Diagnosis not present

## 2015-05-25 DIAGNOSIS — Z79899 Other long term (current) drug therapy: Secondary | ICD-10-CM | POA: Diagnosis not present

## 2015-05-25 DIAGNOSIS — E785 Hyperlipidemia, unspecified: Secondary | ICD-10-CM | POA: Diagnosis present

## 2015-05-25 DIAGNOSIS — J449 Chronic obstructive pulmonary disease, unspecified: Secondary | ICD-10-CM | POA: Diagnosis present

## 2015-05-25 DIAGNOSIS — Z01818 Encounter for other preprocedural examination: Secondary | ICD-10-CM | POA: Diagnosis not present

## 2015-05-25 DIAGNOSIS — Z7902 Long term (current) use of antithrombotics/antiplatelets: Secondary | ICD-10-CM | POA: Diagnosis not present

## 2015-05-25 DIAGNOSIS — Z86010 Personal history of colonic polyps: Secondary | ICD-10-CM | POA: Diagnosis not present

## 2015-05-25 DIAGNOSIS — I716 Thoracoabdominal aortic aneurysm, without rupture: Secondary | ICD-10-CM | POA: Diagnosis not present

## 2015-05-25 DIAGNOSIS — I700 Atherosclerosis of aorta: Secondary | ICD-10-CM | POA: Diagnosis not present

## 2015-05-25 DIAGNOSIS — I5022 Chronic systolic (congestive) heart failure: Secondary | ICD-10-CM | POA: Diagnosis present

## 2015-05-25 DIAGNOSIS — I714 Abdominal aortic aneurysm, without rupture: Secondary | ICD-10-CM | POA: Diagnosis not present

## 2015-05-25 DIAGNOSIS — I447 Left bundle-branch block, unspecified: Secondary | ICD-10-CM | POA: Diagnosis not present

## 2015-05-25 DIAGNOSIS — Z48812 Encounter for surgical aftercare following surgery on the circulatory system: Secondary | ICD-10-CM | POA: Diagnosis not present

## 2015-05-25 DIAGNOSIS — I739 Peripheral vascular disease, unspecified: Secondary | ICD-10-CM | POA: Diagnosis not present

## 2015-05-25 DIAGNOSIS — I110 Hypertensive heart disease with heart failure: Secondary | ICD-10-CM | POA: Diagnosis present

## 2015-05-25 DIAGNOSIS — K5900 Constipation, unspecified: Secondary | ICD-10-CM | POA: Diagnosis present

## 2015-05-25 DIAGNOSIS — Z955 Presence of coronary angioplasty implant and graft: Secondary | ICD-10-CM | POA: Diagnosis not present

## 2015-05-25 DIAGNOSIS — I10 Essential (primary) hypertension: Secondary | ICD-10-CM | POA: Diagnosis not present

## 2015-05-25 DIAGNOSIS — N400 Enlarged prostate without lower urinary tract symptoms: Secondary | ICD-10-CM | POA: Diagnosis present

## 2015-05-25 DIAGNOSIS — I2510 Atherosclerotic heart disease of native coronary artery without angina pectoris: Secondary | ICD-10-CM | POA: Diagnosis present

## 2015-05-25 DIAGNOSIS — Z9889 Other specified postprocedural states: Secondary | ICD-10-CM | POA: Diagnosis not present

## 2015-05-25 DIAGNOSIS — Z95828 Presence of other vascular implants and grafts: Secondary | ICD-10-CM | POA: Diagnosis not present

## 2015-05-26 HISTORY — PX: OTHER SURGICAL HISTORY: SHX169

## 2015-06-01 ENCOUNTER — Other Ambulatory Visit: Payer: Self-pay | Admitting: Physician Assistant

## 2015-06-02 NOTE — Telephone Encounter (Signed)
Please review for refill. Thanks!  

## 2015-06-03 ENCOUNTER — Ambulatory Visit (INDEPENDENT_AMBULATORY_CARE_PROVIDER_SITE_OTHER): Payer: Medicare Other | Admitting: Family Medicine

## 2015-06-03 ENCOUNTER — Encounter: Payer: Self-pay | Admitting: Family Medicine

## 2015-06-03 VITALS — BP 130/50 | HR 55 | Temp 98.7°F | Ht 66.25 in | Wt 136.5 lb

## 2015-06-03 DIAGNOSIS — Z466 Encounter for fitting and adjustment of urinary device: Secondary | ICD-10-CM

## 2015-06-03 DIAGNOSIS — I714 Abdominal aortic aneurysm, without rupture, unspecified: Secondary | ICD-10-CM

## 2015-06-03 DIAGNOSIS — N400 Enlarged prostate without lower urinary tract symptoms: Secondary | ICD-10-CM | POA: Diagnosis not present

## 2015-06-03 MED ORDER — OXYCODONE-ACETAMINOPHEN 5-325 MG PO TABS: 1.0000 | ORAL_TABLET | Freq: Four times a day (QID) | ORAL | Status: DC | PRN

## 2015-06-03 NOTE — Progress Notes (Signed)
Pre visit review using our clinic review tool, if applicable. No additional management support is needed unless otherwise documented below in the visit note. 

## 2015-06-03 NOTE — Progress Notes (Signed)
   Subjective:    Patient ID: Erik [name], male    DOB: 1940/07/12, 75 y.o.   MRN: [id number]  HPI   75 year old male presents for hospital follow up after aneurysm repair at [facility].  He is here for catheter removal.   He was admitted on 3/12 for  abdominal aortic aneurysm repair FEVAR on 3/13.  no intraoperative complications. POD2 he was unable to void after foley cath removal, required  In and Out cath x 2 then foley was replaced. Recommended cathter removal after 7 days in our office for trial of voiding. He is on flomax.  He was discharged on 3/15.   He has been having some low back pain since surgery. No fever. Groin soreness... Pt using 1000 mg tylenol every 2 hours! Out of oxycodone given at hospital.   He has follow up with the vascular MD in 1 months. On plavix for 3 months. Since discharge he has been doing well.  Pt had no previous issue  Prior to surgery he was voiding okay but does have history of BPH. He has been on flomax for a few years.  BP Readings from Last 3 Encounters:  06/03/15 130/50  04/18/15 166/56  02/18/15 140/50        Review of Systems  Constitutional: Negative for fever and fatigue.  HENT: Negative for ear pain.   Eyes: Negative for pain.  Respiratory: Negative for cough and wheezing.   Cardiovascular: Negative for chest pain.       Objective:   Physical Exam  Constitutional: Vital signs are normal. He appears well-developed and well-nourished.  HENT:  Head: Normocephalic.  Right Ear: Hearing normal.  Left Ear: Hearing normal.  Nose: Nose normal.  Mouth/Throat: Oropharynx is clear and moist and mucous membranes are normal.  Neck: Trachea normal. Carotid bruit is not present. No thyroid mass and no thyromegaly present.  Cardiovascular: Normal rate, regular rhythm and normal pulses.  Exam reveals no gallop, no distant heart sounds and no friction rub.   No murmur heard. No peripheral edema  Pulmonary/Chest: Effort normal and  breath sounds normal. No respiratory distress.  Abdominal: He exhibits no fluid wave, no ascites and no mass. There is no tenderness. There is no CVA tenderness.  Genitourinary:  Foley cath in placce  Skin: Skin is warm, dry and intact. No rash noted.  Psychiatric: He has a normal mood and affect. His speech is normal and behavior is normal. Thought content normal.          Assessment & Plan:

## 2015-06-03 NOTE — Patient Instructions (Signed)
Keep appt at urologist Thursday.  Limit pain med as able.

## 2015-06-03 NOTE — Assessment & Plan Note (Signed)
Stable after repair.  BP well controlled. On plavix. Pain med prescribed.

## 2015-06-04 ENCOUNTER — Telehealth: Payer: Self-pay | Admitting: Cardiology

## 2015-06-04 NOTE — Telephone Encounter (Signed)
New message      Pt had echo prior to having an aortic aneurysm repair. Calling to let Dr Aundra Dubin know findings on echo--- density in the right atrium likely represents calcified ridge although thrombus or mass cannot be excluded---  They are faxing the echo report to [place] in case Dr Aundra Dubin want to follow up.  Pt did have his surgery.

## 2015-06-04 NOTE — Telephone Encounter (Signed)
I'll take a look at echo when it comes across.

## 2015-06-05 ENCOUNTER — Encounter: Payer: Self-pay | Admitting: Urology

## 2015-06-05 ENCOUNTER — Ambulatory Visit (INDEPENDENT_AMBULATORY_CARE_PROVIDER_SITE_OTHER): Payer: Medicare Other | Admitting: Urology

## 2015-06-05 VITALS — BP 173/57 | HR 56 | Ht 66.25 in | Wt 136.7 lb

## 2015-06-05 DIAGNOSIS — N401 Enlarged prostate with lower urinary tract symptoms: Secondary | ICD-10-CM

## 2015-06-05 DIAGNOSIS — N138 Other obstructive and reflux uropathy: Secondary | ICD-10-CM | POA: Insufficient documentation

## 2015-06-05 DIAGNOSIS — R338 Other retention of urine: Secondary | ICD-10-CM

## 2015-06-05 NOTE — Progress Notes (Signed)
06/05/2015 9:08 AM   Erik [name] 1941-03-04 [id number]  Referring provider: [name], MD [address] [place], [place] [address]  Chief Complaint  Patient presents with  . Urinary Retention    Cath Removal new patient referred by [name]    HPI: Patient is a 75 year old Caucasian male who had an episode of acute urinary retention after an aortic aneurysm repair who referred to [place] by his primary care physician, Dr. [name], for Foley catheter removal.  Acute urinary retention Patient had abdominal aortic aneurysm repair on 05/25/2015.  On post-operative day two,  he was unable to void after foley cath removal.  He then stated he tried to urinate on his own, twice and failed.  He was then discharged with a foley.  He has been on tamsulosin for the last 3 to 4 years.  He states he has not had any difficulty with urination prior to surgery.  He feels the reason he failed his voiding trial was that they attempted to discharge him too early.  BPH WITH LUTS His IPSS score today is 6, which is mild lower urinary tract symptomatology. He is mixed with his quality life due to his urinary symptoms.  His major complaint today is having a foley in place.   He denies any dysuria, hematuria or suprapubic pain.   He currently taking tamsulosin 0.4 mg daily.   He also denies any recent fevers, chills, nausea or vomiting.  He does not have a family history of PCa.      IPSS      06/05/15 0800       International Prostate Symptom Score   How often have you had the sensation of not emptying your bladder? Not at All     How often have you had to urinate less than every two hours? About half the time     How often have you found you stopped and started again several times when you urinated? Less than 1 in 5 times     How often have you found it difficult to postpone urination? Not at All     How often have you had a weak urinary stream? Not at All     How often have you had to  strain to start urination? Not at All     How many times did you typically get up at night to urinate? 2 Times     Total IPSS Score 6     Quality of Life due to urinary symptoms   If you were to spend the rest of your life with your urinary condition just the way it is now how would you feel about that? Mixed        Score:  1-7 Mild 8-19 Moderate 20-35 Severe     PMH: Past Medical History  Diagnosis Date  . Coronary artery disease     a. LHC (08/2013): Lmain: short 30% distal, LAD: sml D1 & D2, 70% ostial D1, 95-99% LAD stenosis prox D2 LCx: sml/mod ramus subtot. occluded, 40% ostial set off lg OM1, 40% AV LCx after OM1, RCA: 90% prox (DES to RCA and prox LAD)  . Hyperlipidemia   . COPD (chronic obstructive pulmonary disease) ([place])   . BPH (benign prostatic hypertrophy)   . History of colonic polyps   . Hypertension   . Heart murmur   . Collagen vascular disease ([place])   . Chronic systolic heart failure ([place])     a.  EF 20-25%, mild LVH, mod HK, mid apicalanteroseptal myocardium, mild MR, LA mod dilated  . Ischemic cardiomyopathy   . Carotid artery stenosis     a. Bilateral CEA    Surgical History: Past Surgical History  Procedure Laterality Date  . Cholecystectomy      Gall Bladder  . Hernia repair    . Carotid endarterectomy  04/17/2008    right  . Carotid endarterectomy  05/30/08    Left  . Cardiac stents  09-2013  . Percutaneous stent intervention  09/11/2013    Procedure: PERCUTANEOUS STENT INTERVENTION;  Surgeon: Troy Sine, MD;  Location: [facility] CATH LAB;  Service: Cardiovascular;;  DES Prox RCA 3.5x15 xience   . Coronary angiogram  09/11/2013    Procedure: CORONARY ANGIOGRAM;  Surgeon: Troy Sine, MD;  Location: [facility] CATH LAB;  Service: Cardiovascular;;  . Lower aorta aneurysm  05/26/15    Home Medications:    Medication List       This list is accurate as of: 06/05/15  9:08 AM.  Always use your most recent med list.               acetaminophen 325  MG tablet  Commonly known as:  TYLENOL  Take 325 mg by mouth.     aspirin 81 MG tablet  Take 81 mg by mouth daily.     bisoprolol 5 MG tablet  Commonly known as:  ZEBETA  Take 5 mg by mouth daily.     clopidogrel 75 MG tablet  Commonly known as:  PLAVIX  Take 1 tablet (75 mg total) by mouth daily.     docusate sodium 100 MG capsule  Commonly known as:  COLACE  Take 100 mg by mouth.     famotidine 20 MG tablet  Commonly known as:  PEPCID  TAKE 1 TABLET (20 MG TOTAL) BY MOUTH 2 (TWO) TIMES DAILY.     furosemide 20 MG tablet  Commonly known as:  LASIX  TAKE 1 TABLET (20 MG TOTAL) BY MOUTH DAILY.     ibuprofen 200 MG tablet  Commonly known as:  ADVIL,MOTRIN  Take 200 mg by mouth every 6 (six) hours as needed for moderate pain. Reported on 06/05/2015     lisinopril 5 MG tablet  Commonly known as:  PRINIVIL,ZESTRIL  Take 0.5 tablets (2.5 mg total) by mouth daily.     nitroGLYCERIN 0.4 MG SL tablet  Commonly known as:  NITROSTAT  Place 1 tablet (0.4 mg total) under the tongue every 5 (five) minutes x 3 doses as needed for chest pain.     omega-3 acid ethyl esters 1 g capsule  Commonly known as:  LOVAZA  Take by mouth. Reported on 06/05/2015     oxyCODONE-acetaminophen 5-325 MG tablet  Commonly known as:  PERCOCET/ROXICET  Take 1 tablet by mouth every 6 (six) hours as needed for severe pain.     polyethylene glycol packet  Commonly known as:  MIRALAX / GLYCOLAX  Take by mouth. Reported on 06/05/2015     tamsulosin 0.4 MG Caps capsule  Commonly known as:  FLOMAX  Take 1 capsule (0.4 mg total) by mouth daily.     VYTORIN 10-40 MG tablet  Generic drug:  ezetimibe-simvastatin  TAKE 1 TABLET BY MOUTH AT BEDTIME.        Allergies:  Allergies  Allergen Reactions  . Codeine Swelling    REACTION: Throat swells    Family History: Family History  Problem Relation Age of Onset  .  Heart disease Brother   . Hyperlipidemia Brother   . Hypertension Mother   . Cancer  Father   . Hyperlipidemia Brother   . Hyperlipidemia Brother   . Heart attack Brother   . Hyperlipidemia Brother   . Multiple sclerosis Brother   . Kidney disease Neg Hx   . Prostate cancer Neg Hx     Social History:  reports that he quit smoking about 21 months ago. His smoking use included Cigarettes. He has a .75 pack-year smoking history. He quit smokeless tobacco use about 20 months ago. He reports that he does not drink alcohol or use illicit drugs.  ROS: UROLOGY Frequent Urination?: No Hard to postpone urination?: No Burning/pain with urination?: No Get up at night to urinate?: No Leakage of urine?: No Urine stream starts and stops?: No Trouble starting stream?: No Do you have to strain to urinate?: No Blood in urine?: No Urinary tract infection?: No Sexually transmitted disease?: No Injury to kidneys or bladder?: No Painful intercourse?: No Weak stream?: No Erection problems?: No Penile pain?: No  Gastrointestinal Nausea?: No Vomiting?: No Indigestion/heartburn?: No Diarrhea?: Yes Constipation?: No  Constitutional Fever: No Night sweats?: No Weight loss?: No Fatigue?: No  Skin Skin rash/lesions?: No Itching?: No  Eyes Blurred vision?: No Double vision?: No  Ears/Nose/Throat Sore throat?: No Sinus problems?: No  Hematologic/Lymphatic Swollen glands?: No Easy bruising?: Yes  Cardiovascular Leg swelling?: No Chest pain?: No  Respiratory Cough?: No Shortness of breath?: No  Endocrine Excessive thirst?: No  Musculoskeletal Back pain?: Yes Joint pain?: No  Neurological Headaches?: No Dizziness?: No  Psychologic Depression?: No Anxiety?: No  Physical Exam: BP 173/57 mmHg  Pulse 56  Ht 5' 6.25" (1.683 m)  Wt 136 lb 11.2 oz (62.007 kg)  BMI 21.89 kg/m2  Constitutional: Well nourished. Alert and oriented, No acute distress. HEENT: [place] AT, moist mucus membranes. Trachea midline, no masses. Cardiovascular: No clubbing, cyanosis,  or edema. Respiratory: Normal respiratory effort, no increased work of breathing. GI: Abdomen is soft, non tender, non distended, no abdominal masses. Liver and spleen not palpable.  No hernias appreciated.  Stool sample for occult testing is not indicated.   GU: No CVA tenderness.  No bladder fullness or masses.  Patient with circumcised phallus.  Urethral meatus is patent.  No penile discharge. No penile lesions or rashes. Scrotum without lesions, cysts, rashes and/or edema.  Testicles are located scrotally and atrophic bilaterally. No masses are appreciated in the testicles. Left and right epididymis are normal. Rectal: Patient with  normal sphincter tone. Anus and perineum without scarring or rashes. No rectal masses are appreciated. Prostate is approximately 70 grams, no nodules are appreciated. Seminal vesicles are normal. Skin: No rashes, bruises or suspicious lesions. Lymph: No cervical or inguinal adenopathy. Neurologic: Grossly intact, no focal deficits, moving all 4 extremities. Psychiatric: Normal mood and affect.  Laboratory Data: Lab Results  Component Value Date   WBC 10.2 02/05/2015   HGB 12.4* 02/05/2015   HCT 38.0* 02/05/2015   MCV 89.2 02/05/2015   PLT 240 02/05/2015    Lab Results  Component Value Date   CREATININE 0.84 04/15/2015    Lab Results  Component Value Date   PSA 1.02 12/03/2011   PSA 0.73 12/10/2010   PSA 0.83 05/09/2008    Lab Results  Component Value Date   TESTOSTERONE 301.58 12/10/2011    Lab Results  Component Value Date   HGBA1C 5.8 04/15/2015    Lab Results  Component Value Date  TSH 1.178 02/05/2015       Component Value Date/Time   CHOL 101 04/15/2015 1154   HDL 44.00 04/15/2015 1154   CHOLHDL 2 04/15/2015 1154   VLDL 14.6 04/15/2015 1154   LDLCALC 42 04/15/2015 1154    Lab Results  Component Value Date   AST 13 04/15/2015   Lab Results  Component Value Date   ALT 10 04/15/2015     Assessment & Plan:    1.  Acute urinary rentention:   Patient suffered an episode of acute urinary retention after undergoing abdominal aortic aneurysm repair.  He is to continue the tamsulosin.  Foley is removed for voiding trial today.  He is encouraged to drink copious amounts of fluid and return to our office by 3:30 if he is unable to urinate.  If patient is able to urinate on his own, I'll like to see him back in 1 month for I PSS score and PVR.  2. BPH with LUTS:   IPSS score is 6/3.  He will continue the tamsulosin 0.4 mg daily.  He will have his IPSS score and PVR  in one month.   Return in about 1 month (around 07/06/2015) for IPSS score and PVR.  These notes generated with voice recognition software. I apologize for typographical errors.  Zara Council, [facility] [address], [place], [place] [address] [phone]

## 2015-06-05 NOTE — Progress Notes (Signed)
Catheter Removal  Patient is present today for a catheter removal.  37ml of water was drained from the balloon. A 16FR foley cath was removed from the bladder no complications were noted . Patient tolerated well.  Preformed by: Lyndee Hensen CMA  Follow up/ Additional notes: Patient instructed to come back around 3 PM if he can't void.

## 2015-06-05 NOTE — Telephone Encounter (Signed)
Left Erik [name] at [facility] vascular surgery [facility], that Dr. Aundra Dubin is aware of pt's echo results being faxed to this office. MD  will go over the results when it comes across his in basket. Erik [name] to call back if any questions.

## 2015-06-06 ENCOUNTER — Other Ambulatory Visit: Payer: Self-pay | Admitting: *Deleted

## 2015-06-06 DIAGNOSIS — R931 Abnormal findings on diagnostic imaging of heart and coronary circulation: Secondary | ICD-10-CM

## 2015-06-06 DIAGNOSIS — I5021 Acute systolic (congestive) heart failure: Secondary | ICD-10-CM

## 2015-06-10 ENCOUNTER — Other Ambulatory Visit: Payer: Self-pay

## 2015-06-10 ENCOUNTER — Ambulatory Visit (HOSPITAL_COMMUNITY): Payer: Medicare Other | Attending: Cardiology

## 2015-06-10 DIAGNOSIS — E785 Hyperlipidemia, unspecified: Secondary | ICD-10-CM | POA: Diagnosis not present

## 2015-06-10 DIAGNOSIS — I5021 Acute systolic (congestive) heart failure: Secondary | ICD-10-CM | POA: Insufficient documentation

## 2015-06-10 DIAGNOSIS — I110 Hypertensive heart disease with heart failure: Secondary | ICD-10-CM | POA: Diagnosis not present

## 2015-06-10 DIAGNOSIS — I351 Nonrheumatic aortic (valve) insufficiency: Secondary | ICD-10-CM | POA: Diagnosis not present

## 2015-06-10 DIAGNOSIS — I679 Cerebrovascular disease, unspecified: Secondary | ICD-10-CM | POA: Insufficient documentation

## 2015-06-10 DIAGNOSIS — I255 Ischemic cardiomyopathy: Secondary | ICD-10-CM | POA: Diagnosis not present

## 2015-06-10 DIAGNOSIS — J449 Chronic obstructive pulmonary disease, unspecified: Secondary | ICD-10-CM | POA: Insufficient documentation

## 2015-06-10 DIAGNOSIS — Z87891 Personal history of nicotine dependence: Secondary | ICD-10-CM | POA: Diagnosis not present

## 2015-06-10 DIAGNOSIS — I2510 Atherosclerotic heart disease of native coronary artery without angina pectoris: Secondary | ICD-10-CM | POA: Insufficient documentation

## 2015-06-10 DIAGNOSIS — I340 Nonrheumatic mitral (valve) insufficiency: Secondary | ICD-10-CM | POA: Insufficient documentation

## 2015-06-10 DIAGNOSIS — R931 Abnormal findings on diagnostic imaging of heart and coronary circulation: Secondary | ICD-10-CM | POA: Diagnosis not present

## 2015-06-11 ENCOUNTER — Other Ambulatory Visit: Payer: Self-pay | Admitting: Family Medicine

## 2015-06-13 ENCOUNTER — Other Ambulatory Visit: Payer: Self-pay | Admitting: Family Medicine

## 2015-07-03 DIAGNOSIS — I739 Peripheral vascular disease, unspecified: Secondary | ICD-10-CM | POA: Diagnosis not present

## 2015-07-03 DIAGNOSIS — R918 Other nonspecific abnormal finding of lung field: Secondary | ICD-10-CM | POA: Diagnosis not present

## 2015-07-03 DIAGNOSIS — J9811 Atelectasis: Secondary | ICD-10-CM | POA: Diagnosis not present

## 2015-07-03 DIAGNOSIS — I774 Celiac artery compression syndrome: Secondary | ICD-10-CM | POA: Diagnosis not present

## 2015-07-03 DIAGNOSIS — R911 Solitary pulmonary nodule: Secondary | ICD-10-CM | POA: Diagnosis not present

## 2015-07-03 DIAGNOSIS — I716 Thoracoabdominal aortic aneurysm, without rupture: Secondary | ICD-10-CM | POA: Diagnosis not present

## 2015-07-07 ENCOUNTER — Ambulatory Visit (INDEPENDENT_AMBULATORY_CARE_PROVIDER_SITE_OTHER): Payer: Medicare Other | Admitting: Urology

## 2015-07-07 VITALS — BP 178/64 | HR 64 | Ht 66.0 in | Wt 136.0 lb

## 2015-07-07 DIAGNOSIS — J449 Chronic obstructive pulmonary disease, unspecified: Secondary | ICD-10-CM | POA: Insufficient documentation

## 2015-07-07 DIAGNOSIS — I714 Abdominal aortic aneurysm, without rupture, unspecified: Secondary | ICD-10-CM | POA: Insufficient documentation

## 2015-07-07 DIAGNOSIS — R338 Other retention of urine: Secondary | ICD-10-CM | POA: Diagnosis not present

## 2015-07-07 DIAGNOSIS — R351 Nocturia: Secondary | ICD-10-CM

## 2015-07-07 DIAGNOSIS — N401 Enlarged prostate with lower urinary tract symptoms: Secondary | ICD-10-CM | POA: Diagnosis not present

## 2015-07-07 DIAGNOSIS — N138 Other obstructive and reflux uropathy: Secondary | ICD-10-CM

## 2015-07-07 DIAGNOSIS — I2510 Atherosclerotic heart disease of native coronary artery without angina pectoris: Secondary | ICD-10-CM | POA: Insufficient documentation

## 2015-07-07 DIAGNOSIS — I255 Ischemic cardiomyopathy: Secondary | ICD-10-CM | POA: Insufficient documentation

## 2015-07-07 LAB — BLADDER SCAN AMB NON-IMAGING

## 2015-07-07 MED ORDER — TAMSULOSIN HCL 0.4 MG PO CAPS: ORAL_CAPSULE | ORAL | Status: DC

## 2015-07-07 NOTE — Progress Notes (Signed)
10:26 AM   Jasper Riling [birth year] [id number]  Referring provider: [name], MD [address] [place], [place] [address]  Chief Complaint  Patient presents with  . Benign Prostatic Hypertrophy    61month w/IPSS and PVR    HPI: Patient is a 75 year old Caucasian male who had an episode of acute urinary retention after an aortic aneurysm repair who underwent a voiding trial 1 month ago who presents today for follow-up.  Acute urinary retention Patient had an episode of acute urinary retention after an aortic aneurysm repair and was referred to [place] by his primary care physician, Dr. [name], for Foley catheter removal.  Patient had abdominal aortic aneurysm repair on 05/25/2015.  On post-operative day two,  he was unable to void after foley cath removal.  He then stated he tried to urinate on his own, twice and failed.  He was then discharged with a foley.  He has been on tamsulosin for the last 3 to 4 years.  He states he has not had any difficulty with urination prior to surgery.  He feels the reason he failed his voiding trial was that they attempted to discharge him too early.  He states he has been voiding well since the Foley was removed 1 month ago.  He is taking the tamsulosin 0.4 mg daily.    BPH WITH LUTS His IPSS score today is 17, which is moderate lower urinary tract symptomatology. He is mixed with his quality life due to his urinary symptoms.  His previous I PSS score 6/3.  His PVR today is 53 mL.  His major complaint at this time is nocturia x 3.  He denies any dysuria, hematuria or suprapubic pain.   He currently taking tamsulosin 0.4 mg daily.   He also denies any recent fevers, chills, nausea or vomiting.  He does not have a family history of PCa.      IPSS      06/05/15 0800 07/07/15 1000     International Prostate Symptom Score   How often have you had the sensation of not emptying your bladder? Not at All Not at All    How often have you had to  urinate less than every two hours? About half the time Almost always    How often have you found you stopped and started again several times when you urinated? Less than 1 in 5 times About half the time    How often have you found it difficult to postpone urination? Not at All Not at All    How often have you had a weak urinary stream? Not at All Almost always    How often have you had to strain to start urination? Not at All Less than 1 in 5 times    How many times did you typically get up at night to urinate? 2 Times 3 Times    Total IPSS Score 6 17    Quality of Life due to urinary symptoms   If you were to spend the rest of your life with your urinary condition just the way it is now how would you feel about that? Mixed Mixed       Score:  1-7 Mild 8-19 Moderate 20-35 Severe     PMH: Past Medical History  Diagnosis Date  . Coronary artery disease     a. LHC (08/2013): Lmain: short 30% distal, LAD: sml D1 & D2, 70% ostial D1, 95-99% LAD stenosis prox D2  LCx: sml/mod ramus subtot. occluded, 40% ostial set off lg OM1, 40% AV LCx after OM1, RCA: 90% prox (DES to RCA and prox LAD)  . Hyperlipidemia   . COPD (chronic obstructive pulmonary disease) ([place])   . BPH (benign prostatic hypertrophy)   . History of colonic polyps   . Hypertension   . Heart murmur   . Collagen vascular disease ([place])   . Chronic systolic heart failure ([facility])     a. EF 20-25%, mild LVH, mod HK, mid apicalanteroseptal myocardium, mild MR, LA mod dilated  . Ischemic cardiomyopathy   . Carotid artery stenosis     a. Bilateral CEA    Surgical History: Past Surgical History  Procedure Laterality Date  . Cholecystectomy      Gall Bladder  . Hernia repair    . Carotid endarterectomy  04/17/2008    right  . Carotid endarterectomy  05/30/08    Left  . Cardiac stents  09-2013  . Percutaneous stent intervention  09/11/2013    Procedure: PERCUTANEOUS STENT INTERVENTION;  Surgeon: Troy Sine, MD;  Location:  [facility] CATH LAB;  Service: Cardiovascular;;  DES Prox RCA 3.5x15 xience   . Coronary angiogram  09/11/2013    Procedure: CORONARY ANGIOGRAM;  Surgeon: Troy Sine, MD;  Location[facility] C[facility] LAB;  Service: Cardiovascular;;  . Lower aorta aneurysm  05/26/15    Home Medications:    Medication List       This list is accurate as of: 07/07/15 10:26 AM.  Always use your most recent med list.               acetaminophen 325 MG tablet  Commonly known as:  TYLENOL  Take 325 mg by mouth.     aspirin 81 MG tablet  Take 81 mg by mouth daily.     bisoprolol 5 MG tablet  Commonly known as:  ZEBETA  Take 5 mg by mouth daily.     clopidogrel 75 MG tablet  Commonly known as:  PLAVIX  Take 1 tablet (75 mg total) by mouth daily.     famotidine 20 MG tablet  Commonly known as:  PEPCID  TAKE 1 TABLET (20 MG TOTAL) BY MOUTH 2 (TWO) TIMES DAILY.     furosemide 20 MG tablet  Commonly known as:  LASIX  TAKE 1 TABLET (20 MG TOTAL) BY MOUTH DAILY.     ibuprofen 200 MG tablet  Commonly known as:  ADVIL,MOTRIN  Take 200 mg by mouth every 6 (six) hours as needed for moderate pain. Reported on 06/05/2015     lisinopril 5 MG tablet  Commonly known as:  PRINIVIL,ZESTRIL  Take 0.5 tablets (2.5 mg total) by mouth daily.     nitroGLYCERIN 0.4 MG SL tablet  Commonly known as:  NITROSTAT  Place 1 tablet (0.4 mg total) under the tongue every 5 (five) minutes x 3 doses as needed for chest pain.     omega-3 acid ethyl esters 1 g capsule  Commonly known as:  LOVAZA  Take by mouth. Reported on 06/05/2015     oxyCODONE-acetaminophen 5-325 MG tablet  Commonly known as:  PERCOCET/ROXICET  Take 1 tablet by mouth every 6 (six) hours as needed for severe pain.     tamsulosin 0.4 MG Caps capsule  Commonly known as:  FLOMAX  TAKE 1 CAPSULE (0.4 MG TOTAL) BY MOUTH DAILY.     VYTORIN 10-40 MG tablet  Generic drug:  ezetimibe-simvastatin  TAKE 1 TABLET BY MOUTH AT BEDTIME.  Allergies:  Allergies    Allergen Reactions  . Codeine Swelling    REACTION: Throat swells    Family History: Family History  Problem Relation Age of Onset  . Heart disease Brother   . Hyperlipidemia Brother   . Hypertension Mother   . Cancer Father   . Hyperlipidemia Brother   . Hyperlipidemia Brother   . Heart attack Brother   . Hyperlipidemia Brother   . Multiple sclerosis Brother   . Kidney disease Neg Hx   . Prostate cancer Neg Hx     Social History:  reports that he quit smoking about 22 months ago. His smoking use included Cigarettes. He has a .75 pack-year smoking history. He quit smokeless tobacco use about 21 months ago. He reports that he does not drink alcohol or use illicit drugs.  ROS: UROLOGY Frequent Urination?: Yes Hard to postpone urination?: No Burning/pain with urination?: No Get up at night to urinate?: Yes Leakage of urine?: No Urine stream starts and stops?: No Trouble starting stream?: No Do you have to strain to urinate?: No Blood in urine?: No Urinary tract infection?: No Sexually transmitted disease?: No Injury to kidneys or bladder?: No Painful intercourse?: No Weak stream?: No Erection problems?: No Penile pain?: No  Gastrointestinal Nausea?: No Vomiting?: No Indigestion/heartburn?: No Diarrhea?: No Constipation?: No  Constitutional Fever: No Night sweats?: No Weight loss?: No Fatigue?: No  Skin Skin rash/lesions?: No Itching?: No  Eyes Blurred vision?: No Double vision?: No  Ears/Nose/Throat Sore throat?: No Sinus problems?: No  Hematologic/Lymphatic Swollen glands?: No Easy bruising?: Yes  Cardiovascular Leg swelling?: No Chest pain?: No  Respiratory Cough?: No Shortness of breath?: No  Endocrine Excessive thirst?: No  Musculoskeletal Back pain?: No Joint pain?: No  Neurological Headaches?: No Dizziness?: No  Psychologic Depression?: No Anxiety?: No  Physical Exam: BP 178/64 mmHg  Pulse 64  Ht 5\' 6"  (1.676 m)   Wt 136 lb (61.689 kg)  BMI 21.96 kg/m2  Constitutional: Well nourished. Alert and oriented, No acute distress. HEENT: [place] AT, moist mucus membranes. Trachea midline, no masses. Cardiovascular: No clubbing, cyanosis, or edema. Respiratory: Normal respiratory effort, no increased work of breathing. GI: Abdomen is soft, non tender, non distended, no abdominal masses. Liver and spleen not palpable.  No hernias appreciated.  Stool sample for occult testing is not indicated.   GU: No CVA tenderness.  No bladder fullness or masses.  Patient with circumcised phallus.  Urethral meatus is patent.  No penile discharge. No penile lesions or rashes. Scrotum without lesions, cysts, rashes and/or edema.  Testicles are located scrotally and atrophic bilaterally. No masses are appreciated in the testicles. Left and right epididymis are normal. Rectal: Patient with  normal sphincter tone. Anus and perineum without scarring or rashes. No rectal masses are appreciated. Prostate is approximately 70 grams, no nodules are appreciated. Seminal vesicles are normal. Skin: No rashes, bruises or suspicious lesions. Lymph: No cervical or inguinal adenopathy. Neurologic: Grossly intact, no focal deficits, moving all 4 extremities. Psychiatric: Normal mood and affect.  Laboratory Data: Lab Results  Component Value Date   WBC 10.2 02/05/2015   HGB 12.4* 02/05/2015   HCT 38.0* 02/05/2015   MCV 89.2 02/05/2015   PLT 240 02/05/2015    Lab Results  Component Value Date   CREATININE 0.84 04/15/2015    Lab Results  Component Value Date   PSA 1.02 12/03/2011   PSA 0.73 12/10/2010   PSA 0.83 05/09/2008    Lab Results  Component Value  Date   TESTOSTERONE 301.58 12/10/2011    Lab Results  Component Value Date   HGBA1C 5.8 04/15/2015    Lab Results  Component Value Date   TSH 1.178 02/05/2015       Component Value Date/Time   CHOL 101 04/15/2015 1154   HDL 44.00 04/15/2015 1154   CHOLHDL 2 04/15/2015  1154   VLDL 14.6 04/15/2015 1154   LDLCALC 42 04/15/2015 1154    Lab Results  Component Value Date   AST 13 04/15/2015   Lab Results  Component Value Date   ALT 10 04/15/2015   Pertinent imaging Results for MCCOY, PITZ (MRN [id number]) as of 07/13/2015 14:31  Ref. Range 07/07/2015 10:09  Scan Result Unknown 43ml    Assessment & Plan:    1. Acute urinary rentention:   PVR is 59 mL.  We will continue to monitor.  He will RTC in one year for I PSS score and exam. He will contact our office if he should have difficulty urinating.  2. BPH with LUTS:   IPSS score is 17/3.  Patient stated his score is worse due to his fluid pills. He will continue the tamsulosin 0.4 mg daily.  He will RTC for  IPSS score, exam and PVR  in one year.   3. Nocturia:   I explained to the patient that nocturia is often multi-factorial and difficult to treat.  Sleeping disorders, heart conditions and peripheral vascular disease, diabetes,  enlarged prostate or urethral stricture causing bladder outlet obstruction and/or certain medications.  I have suggested that the patient avoid caffeine and alcohol in the evening.  He stated he does not consume alcohol.  He may also benefit from fluid restrictions after 6:00 in the evening and voiding just prior to bedtime.  He has no interest in obtaining a sleep study at this time as he does not want to use a CPAP machine.   We will reassess when he returns in 1 year for I PSS score and exam  Return in about 1 year (around 07/06/2016) for IPSS and exam.  These notes generated with voice recognition software. I apologize for typographical errors.  Zara Council, [facility] [address], [place], [place] [address] [phone]

## 2015-07-13 ENCOUNTER — Encounter: Payer: Self-pay | Admitting: Urology

## 2015-07-13 DIAGNOSIS — R351 Nocturia: Secondary | ICD-10-CM | POA: Insufficient documentation

## 2015-07-30 ENCOUNTER — Encounter: Payer: Self-pay | Admitting: Vascular Surgery

## 2015-07-31 ENCOUNTER — Other Ambulatory Visit (HOSPITAL_COMMUNITY): Payer: Self-pay | Admitting: Internal Medicine

## 2015-08-05 ENCOUNTER — Other Ambulatory Visit: Payer: Self-pay | Admitting: *Deleted

## 2015-08-05 DIAGNOSIS — I6523 Occlusion and stenosis of bilateral carotid arteries: Secondary | ICD-10-CM

## 2015-08-06 ENCOUNTER — Telehealth: Payer: Self-pay | Admitting: Vascular Surgery

## 2015-08-06 ENCOUNTER — Ambulatory Visit (HOSPITAL_COMMUNITY)
Admission: RE | Admit: 2015-08-06 | Discharge: 2015-08-06 | Disposition: A | Discharge status: Home / Self Care | Payer: Medicare Other | Source: Ambulatory Visit | Attending: Vascular Surgery | Admitting: Vascular Surgery

## 2015-08-06 ENCOUNTER — Ambulatory Visit: Payer: Medicare Other | Admitting: Vascular Surgery

## 2015-08-06 DIAGNOSIS — I714 Abdominal aortic aneurysm, without rupture, unspecified: Secondary | ICD-10-CM

## 2015-08-06 DIAGNOSIS — I6523 Occlusion and stenosis of bilateral carotid arteries: Secondary | ICD-10-CM

## 2015-08-06 NOTE — Telephone Encounter (Signed)
Erik [name] stated his AAA was repaired at [facility] 05/26/2015.  His 56mo f/u aaa duplex and visit with Dr. Scot Dock was cancelled.   He will continue to follow up with Dr. Scot Dock for his carotids.

## 2015-08-10 ENCOUNTER — Other Ambulatory Visit (HOSPITAL_COMMUNITY): Payer: Self-pay | Admitting: Adult Health

## 2015-09-11 DIAGNOSIS — I714 Abdominal aortic aneurysm, without rupture: Secondary | ICD-10-CM | POA: Diagnosis not present

## 2015-09-11 DIAGNOSIS — I10 Essential (primary) hypertension: Secondary | ICD-10-CM | POA: Diagnosis not present

## 2015-09-11 DIAGNOSIS — E785 Hyperlipidemia, unspecified: Secondary | ICD-10-CM | POA: Diagnosis not present

## 2015-09-11 DIAGNOSIS — I2510 Atherosclerotic heart disease of native coronary artery without angina pectoris: Secondary | ICD-10-CM | POA: Diagnosis not present

## 2015-09-11 DIAGNOSIS — F17210 Nicotine dependence, cigarettes, uncomplicated: Secondary | ICD-10-CM | POA: Diagnosis not present

## 2015-09-17 ENCOUNTER — Other Ambulatory Visit: Payer: Self-pay | Admitting: *Deleted

## 2015-09-17 MED ORDER — CLOPIDOGREL BISULFATE 75 MG PO TABS: 75.0000 mg | ORAL_TABLET | Freq: Every day | ORAL | Status: DC

## 2015-09-17 NOTE — Telephone Encounter (Signed)
clopidogrel (PLAVIX) 75 MG tablet  Medication   Date: 04/07/2015  Department: [facility]  Ordering/Authorizing: Larey Dresser, MD      Order Providers    Prescribing Provider Encounter Provider   Larey Dresser, MD Scarlette Calico, RN    Medication Detail      Disp Refills Start End     clopidogrel (PLAVIX) 75 MG tablet 30 tablet 6 04/07/2015     Sig - Route: Take 1 tablet (75 mg total) by mouth daily. - Oral    E-Prescribing Status: Receipt confirmed by pharmacy (04/07/2015 12:15 PM EST)     Pharmacy    [facility] #[id number] - [place], [place]

## 2015-09-23 ENCOUNTER — Other Ambulatory Visit: Payer: Self-pay

## 2015-09-23 MED ORDER — CLOPIDOGREL BISULFATE 75 MG PO TABS: 75.0000 mg | ORAL_TABLET | Freq: Every day | ORAL | Status: DC

## 2015-09-23 NOTE — Telephone Encounter (Signed)
clopidogrel (PLAVIX) 75 MG tablet  Medication   Date: 09/17/2015  Department: [address] [facility]  Ordering/Authorizing: Larey Dresser, MD      Order Providers    Prescribing Provider Encounter Provider   Larey Dresser, MD Roberts Gaudy, CMA    Medication Detail      Disp Refills Start End     clopidogrel (PLAVIX) 75 MG tablet 30 tablet 6 09/17/2015     Sig - Route: Take 1 tablet (75 mg total) by mouth daily. - Oral    E-Prescribing Status: Receipt confirmed by pharmacy (09/17/2015 4:53 PM EDT)     Pharmacy    [facility] #[id number] - [place], [place] is requesting 90 day supply

## 2015-10-01 ENCOUNTER — Telehealth: Payer: Self-pay | Admitting: Cardiology

## 2015-10-01 NOTE — Telephone Encounter (Signed)
[facility] Famotidine 20 mg tablet taken 1 tablet twice daily. Please advise

## 2015-10-02 ENCOUNTER — Other Ambulatory Visit: Payer: Self-pay | Admitting: *Deleted

## 2015-10-02 NOTE — Telephone Encounter (Signed)
Ok to refill? Please advise. Thanks, MI 

## 2015-10-03 ENCOUNTER — Telehealth: Payer: Self-pay

## 2015-10-03 ENCOUNTER — Telehealth: Payer: Self-pay | Admitting: Cardiovascular Disease

## 2015-10-03 MED ORDER — FAMOTIDINE 20 MG PO TABS: ORAL_TABLET | ORAL | Status: DC

## 2015-10-03 NOTE — Telephone Encounter (Signed)
I would check with PCP

## 2015-10-03 NOTE — Telephone Encounter (Signed)
Erik [name] left v/m requesting refill famotidine to [facility]; cardiologist advised pt to have PCP continue refill of famotidine. Last refill # 60 x 3 on 06/02/15. Last annual exam on 04/18/15. Dr [name] has not previously filled but Erik [name] said discussed at 04/2015 CPX. And pt is doing well on med. Refill done as requested. FYI to Dr [name].

## 2015-10-03 NOTE — Telephone Encounter (Signed)
ERROR

## 2015-11-18 ENCOUNTER — Other Ambulatory Visit (HOSPITAL_COMMUNITY): Payer: Self-pay | Admitting: Cardiology

## 2015-11-23 ENCOUNTER — Other Ambulatory Visit (HOSPITAL_COMMUNITY): Payer: Self-pay | Admitting: Internal Medicine

## 2015-12-11 DIAGNOSIS — I10 Essential (primary) hypertension: Secondary | ICD-10-CM | POA: Diagnosis not present

## 2015-12-11 DIAGNOSIS — Z09 Encounter for follow-up examination after completed treatment for conditions other than malignant neoplasm: Secondary | ICD-10-CM | POA: Diagnosis not present

## 2015-12-11 DIAGNOSIS — I2510 Atherosclerotic heart disease of native coronary artery without angina pectoris: Secondary | ICD-10-CM | POA: Diagnosis not present

## 2015-12-11 DIAGNOSIS — Z95828 Presence of other vascular implants and grafts: Secondary | ICD-10-CM | POA: Diagnosis not present

## 2015-12-11 DIAGNOSIS — I714 Abdominal aortic aneurysm, without rupture: Secondary | ICD-10-CM | POA: Diagnosis not present

## 2015-12-11 DIAGNOSIS — Z87891 Personal history of nicotine dependence: Secondary | ICD-10-CM | POA: Diagnosis not present

## 2015-12-11 DIAGNOSIS — I716 Thoracoabdominal aortic aneurysm, without rupture: Secondary | ICD-10-CM | POA: Diagnosis not present

## 2015-12-11 DIAGNOSIS — E785 Hyperlipidemia, unspecified: Secondary | ICD-10-CM | POA: Diagnosis not present

## 2015-12-30 ENCOUNTER — Other Ambulatory Visit: Payer: Self-pay | Admitting: Family Medicine

## 2016-01-08 ENCOUNTER — Encounter: Payer: Self-pay | Admitting: Family

## 2016-01-14 ENCOUNTER — Ambulatory Visit (INDEPENDENT_AMBULATORY_CARE_PROVIDER_SITE_OTHER): Payer: Medicare Other | Admitting: Family

## 2016-01-14 ENCOUNTER — Ambulatory Visit (HOSPITAL_COMMUNITY)
Admission: RE | Admit: 2016-01-14 | Discharge: 2016-01-14 | Disposition: A | Discharge status: Home / Self Care | Payer: Medicare Other | Source: Ambulatory Visit | Attending: Vascular Surgery | Admitting: Vascular Surgery

## 2016-01-14 ENCOUNTER — Encounter: Payer: Self-pay | Admitting: Family

## 2016-01-14 VITALS — BP 168/58 | HR 53 | Temp 97.4°F | Ht 66.25 in | Wt 141.0 lb

## 2016-01-14 DIAGNOSIS — I6523 Occlusion and stenosis of bilateral carotid arteries: Secondary | ICD-10-CM | POA: Insufficient documentation

## 2016-01-14 DIAGNOSIS — Z9889 Other specified postprocedural states: Secondary | ICD-10-CM

## 2016-01-14 DIAGNOSIS — I714 Abdominal aortic aneurysm, without rupture, unspecified: Secondary | ICD-10-CM

## 2016-01-14 DIAGNOSIS — Z87891 Personal history of nicotine dependence: Secondary | ICD-10-CM

## 2016-01-14 LAB — VAS US CAROTID
LCCADDIAS: 8 cm/s
LICADDIAS: -17 cm/s
LICADSYS: -110 cm/s
LICAPSYS: -66 cm/s
Left CCA dist sys: 66 cm/s
Left CCA prox dias: 9 cm/s
Left CCA prox sys: 81 cm/s
Left ICA prox dias: -12 cm/s
RCCAPSYS: 95 cm/s
RIGHT CCA MID DIAS: 8 cm/s
Right CCA prox dias: 10 cm/s
Right cca dist sys: -82 cm/s

## 2016-01-14 NOTE — Progress Notes (Signed)
Chief Complaint: Follow up Extracranial Carotid Artery Stenosis   History of Present Illness  Erik [name] is a 75 y.o. male patient of Dr. Scot Dock who is s/p bilateral carotid endarterectomies in February and March of 2010.  He also has a AAA diagnosed October 2015 after prominent aortic pulse detected.  He is s/p 2-vessel FEVAR with a Z-Fen device on May 28, 2015 by Dr. 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 Pt states he was hospitalized re this for 3 days.  He returns today for routine surveillance of his extracranial carotid artery stenosis and follow up. The patient has no history of TIA or stroke symptoms. Specifically he denies any history of amaurosis fugax or monocular blindness, unilateral facial drooping, hemiplegia, or receptive or expressive aphasia.  He denies steal symptoms in either hand or arm.  The patient denies abdominal or back pain, his brother has a AAA also.   He had cardiac stents placed July, 2015, states he may have had an MI.   He states his blood pressure at home is 0000000 systolic at home.  Pt Diabetic: No  Pt smoker: former smoker, quit June, 2015   Pt meds include:  Statin : Yes  ASA: Yes  Other anticoagulants/antiplatelets: Plavix prescribed by his cardiologist, Dr. Aundra Dubin, for CAD    Past Medical History:  Diagnosis Date  . BPH (benign prostatic hypertrophy)   . Carotid artery stenosis    a. Bilateral CEA  . Chronic systolic heart failure (HCC)    a. EF 20-25%, mild LVH, mod HK, mid apicalanteroseptal myocardium, mild MR, LA mod dilated  . Collagen vascular disease ([place])   . COPD (chronic obstructive pulmonary disease) ([place])   . Coronary artery disease    a. LHC (08/2013): [place]: short 30% distal, LAD: sml D1 & D2, 70% ostial D1, 95-99% LAD stenosis prox D2 LCx: sml/mod ramus subtot. occluded, 40% ostial set off lg OM1, 40% AV LCx after OM1, RCA: 90% prox (DES to RCA and  prox LAD)  . Heart murmur   . History of colonic polyps   . Hyperlipidemia   . Hypertension   . Ischemic cardiomyopathy     Social History Social History  Substance Use Topics  . Smoking status: Former Smoker    Packs/day: 1.50    Years: 0.50    Types: Cigarettes    Quit date: 08/27/2013  . Smokeless tobacco: Former Systems developer    Quit date: 09/10/2013  . Alcohol use No    Family History Family History  Problem Relation Age of Onset  . Heart disease Brother   . Hyperlipidemia Brother   . Hypertension Mother   . Cancer Father   . Hyperlipidemia Brother   . Hyperlipidemia Brother   . Heart attack Brother   . Hyperlipidemia Brother   . Multiple sclerosis Brother   . Kidney disease Neg Hx   . Prostate cancer Neg Hx     Surgical History Past Surgical History:  Procedure Laterality Date  . cardiac stents  09-2013  . CAROTID ENDARTERECTOMY  04/17/2008   right  . CAROTID ENDARTERECTOMY  05/30/08   Left  . CHOLECYSTECTOMY     Gall Bladder  . CORONARY ANGIOGRAM  09/11/2013   Procedure: CORONARY ANGIOGRAM;  Surgeon: Troy Sine, MD;  Location: [facility] CATH LAB;  Service: Cardiovascular;;  . HERNIA REPAIR    . lower aorta aneurysm  05/26/15  . PERCUTANEOUS STENT INTERVENTION  09/11/2013   Procedure: PERCUTANEOUS  STENT INTERVENTION;  Surgeon: Troy Sine, MD;  Location: [facility] CATH LAB;  Service: Cardiovascular;;  DES Prox RCA 3.5x15 xience     Allergies  Allergen Reactions  . Codeine Swelling    REACTION: Throat swells    Current Outpatient Prescriptions  Medication Sig Dispense Refill  . acetaminophen (TYLENOL) 325 MG tablet Take 325 mg by mouth.    Marland Kitchen aspirin 81 MG tablet Take 81 mg by mouth daily.      . bisoprolol (ZEBETA) 5 MG tablet Take 1 tablet (5 mg total) by mouth daily. 30 tablet 6  . clopidogrel (PLAVIX) 75 MG tablet Take 1 tablet (75 mg total) by mouth daily. 90 tablet 3  . ezetimibe-simvastatin (VYTORIN) 10-40 MG tablet Take 1 tablet by mouth daily.    .  famotidine (PEPCID) 20 MG tablet TAKE 1 TABLET (20 MG TOTAL) BY MOUTH 2 (TWO) TIMES DAILY. 60 tablet 5  . furosemide (LASIX) 20 MG tablet TAKE 1 TABLET (20 MG TOTAL) BY MOUTH DAILY. 30 tablet 3  . lisinopril (PRINIVIL,ZESTRIL) 5 MG tablet TAKE 0.5 TABLETS (2.5 MG TOTAL) BY MOUTH DAILY. 30 tablet 1  . omega-3 acid ethyl esters (LOVAZA) 1 g capsule Take by mouth. Reported on 06/05/2015    . tamsulosin (FLOMAX) 0.4 MG CAPS capsule TAKE 1 CAPSULE (0.4 MG TOTAL) BY MOUTH DAILY. 90 capsule 2  . nitroGLYCERIN (NITROSTAT) 0.4 MG SL tablet Place 1 tablet (0.4 mg total) under the tongue every 5 (five) minutes x 3 doses as needed for chest pain. (Patient not taking: Reported on 01/14/2016) 30 tablet 12  . oxyCODONE-acetaminophen (PERCOCET/ROXICET) 5-325 MG tablet Take 1 tablet by mouth every 6 (six) hours as needed for severe pain. (Patient not taking: Reported on 01/14/2016) 30 tablet 0   No current facility-administered medications for this visit.     Review of Systems : See HPI for pertinent positives and negatives.  Physical Examination  Vitals:   01/14/16 1139  BP: (!) 168/58  Pulse: (!) 53  Temp: 97.4 F (36.3 C)  TempSrc: Oral  SpO2: 98%  Weight: 141 lb (64 kg)  Height: 5' 6.25" (1.683 m)   Body mass index is 22.59 kg/m.  General: WDWN male in NAD  GAIT: normal  Eyes: PERRLA  Pulmonary: Respirations are non-labored, CTAB, no rales, no rhonchi, or wheezing.  Cardiac: regular rhythm, positive murmur.   VASCULAR EXAM  Carotid Bruits  Right  Left    positive positive   Aorta is palpable.  Radial pulses: right is 2+ palpable, left is 1+ palpable.   LE Pulses  Right  Left   FEMORAL  2+ palpable  2+ palpable   POPLITEAL  not palpable  not palpable   POSTERIOR TIBIAL  2+ palpable  2+ palpable   DORSALIS PEDIS  ANTERIOR TIBIAL  1+ palpable  1+ palpable    Gastrointestinal: soft, nontender, BS WNL, no r/g, no palpable  masses. Musculoskeletal: No muscle atrophy/wasting. M/S 5/5 throughout, Extremities without ischemic changes.  Neurologic: A&O X 3; Appropriate Affect, sensation is normal, Speech is normal  CN 2-12 intact, Pain and light touch intact in extremities, Motor exam as listed above.    Assessment: KIYEN BARRACO is a 75 y.o. male who is s/p bilateral carotid endarterectomies in February and March of 2010.  He is also s/p 2-vessel FEVAR with a Z-Fen device on May 28, 2015 by Dr. 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 Dr. Sammuel Hines is monitoring pt re this.  Pt has done well after this operation.   He has no history of stroke or TIA. He has no back or abdominal pain. His pedal pulses are palpable.    DATA Today's carotid duplex suggests patent bilateral endarterectomy sites with no evidence of hyperplasia or restenosis.  No significant stenosis of the bilateral ECA of CCA.  Bilateral vertebral arteries are antegrade (normal).  Bilateral subclavian arteries are multiphasic.  No significant change in comparison to the last exam on 01/09/15.    Plan: Follow-up in 1 year with Carotid Duplex scan.    I discussed in depth with the patient the nature of atherosclerosis, and emphasized the importance of maximal medical management including strict control of blood pressure, blood glucose, and lipid levels, obtaining regular exercise, and continued cessation of smoking.  The patient is aware that without maximal medical management the underlying atherosclerotic disease process will progress, limiting the benefit of any interventions. The patient was given information about stroke prevention and what symptoms should prompt the patient to seek immediate medical care. Thank you for allowing [place] to participate in this patient's care.  Clemon Chambers, RN, MSN, FNP-C [facility] Office: [phone]  Clinic Physician:  Scot Dock  01/14/16 11:50 AM

## 2016-01-14 NOTE — Patient Instructions (Signed)
Stroke Prevention Some medical conditions and behaviors are associated with an increased chance of having a stroke. You may prevent a stroke by making healthy choices and managing medical conditions. HOW CAN I REDUCE MY RISK OF HAVING A STROKE?   Stay physically active. Get at least 30 minutes of activity on most or all days.  Do not smoke. It may also be helpful to avoid exposure to secondhand smoke.  Limit alcohol use. Moderate alcohol use is considered to be:  No more than 2 drinks per day for men.  No more than 1 drink per day for nonpregnant women.  Eat healthy foods. This involves:  Eating 5 or more servings of fruits and vegetables a day.  Making dietary changes that address high blood pressure (hypertension), high cholesterol, diabetes, or obesity.  Manage your cholesterol levels.  Making food choices that are high in fiber and low in saturated fat, trans fat, and cholesterol may control cholesterol levels.  Take any prescribed medicines to control cholesterol as directed by your health care provider.  Manage your diabetes.  Controlling your carbohydrate and sugar intake is recommended to manage diabetes.  Take any prescribed medicines to control diabetes as directed by your health care provider.  Control your hypertension.  Making food choices that are low in salt (sodium), saturated fat, trans fat, and cholesterol is recommended to manage hypertension.  Ask your health care provider if you need treatment to lower your blood pressure. Take any prescribed medicines to control hypertension as directed by your health care provider.  If you are 18-39 years of age, have your blood pressure checked every 3-5 years. If you are 40 years of age or older, have your blood pressure checked every year.  Maintain a healthy weight.  Reducing calorie intake and making food choices that are low in sodium, saturated fat, trans fat, and cholesterol are recommended to manage  weight.  Stop drug abuse.  Avoid taking birth control pills.  Talk to your health care provider about the risks of taking birth control pills if you are over 35 years old, smoke, get migraines, or have ever had a blood clot.  Get evaluated for sleep disorders (sleep apnea).  Talk to your health care provider about getting a sleep evaluation if you snore a lot or have excessive sleepiness.  Take medicines only as directed by your health care provider.  For some people, aspirin or blood thinners (anticoagulants) are helpful in reducing the risk of forming abnormal blood clots that can lead to stroke. If you have the irregular heart rhythm of atrial fibrillation, you should be on a blood thinner unless there is a good reason you cannot take them.  Understand all your medicine instructions.  Make sure that other conditions (such as anemia or atherosclerosis) are addressed. SEEK IMMEDIATE MEDICAL CARE IF:   You have sudden weakness or numbness of the face, arm, or leg, especially on one side of the body.  Your face or eyelid droops to one side.  You have sudden confusion.  You have trouble speaking (aphasia) or understanding.  You have sudden trouble seeing in one or both eyes.  You have sudden trouble walking.  You have dizziness.  You have a loss of balance or coordination.  You have a sudden, severe headache with no known cause.  You have new chest pain or an irregular heartbeat. Any of these symptoms may represent a serious problem that is an emergency. Do not wait to see if the symptoms will   go away. Get medical help at once. Call your local emergency services (911 in [place] Do not drive yourself to the hospital.   This information is not intended to replace advice given to you by your health care provider. Make sure you discuss any questions you have with your health care provider.   Document Released: 04/08/2004 Document Revised: 03/22/2014 Document Reviewed:  09/01/2012 Elsevier Interactive Patient Education 2016 Elsevier Inc.  

## 2016-01-18 ENCOUNTER — Other Ambulatory Visit: Payer: Self-pay | Admitting: Family Medicine

## 2016-01-25 ENCOUNTER — Other Ambulatory Visit: Payer: Self-pay | Admitting: Family Medicine

## 2016-01-28 ENCOUNTER — Other Ambulatory Visit: Payer: Self-pay | Admitting: Family Medicine

## 2016-02-03 NOTE — Addendum Note (Signed)
Addended by: Lianne Cure A on: 02/03/2016 03:27 PM   Modules accepted: Orders

## 2016-02-04 ENCOUNTER — Other Ambulatory Visit (HOSPITAL_COMMUNITY): Payer: Medicare Other

## 2016-02-04 ENCOUNTER — Ambulatory Visit: Payer: Medicare Other | Admitting: Vascular Surgery

## 2016-02-12 ENCOUNTER — Other Ambulatory Visit: Payer: Self-pay

## 2016-02-12 MED ORDER — NITROGLYCERIN 0.4 MG SL SUBL: 0.4000 mg | SUBLINGUAL_TABLET | SUBLINGUAL | 3 refills | Status: DC | PRN

## 2016-02-12 NOTE — Telephone Encounter (Signed)
Erik [name] request refill ntg to [facility]; pt has never used but pt under stress with family at times and wants on hand in case needed. No CP now;pt has not seen card recently and does not have appt. Last annual 04/18/15. Refill done and FYI to Dr [name].

## 2016-02-12 NOTE — Telephone Encounter (Signed)
Noted  

## 2016-02-26 ENCOUNTER — Telehealth: Payer: Self-pay

## 2016-02-26 NOTE — Telephone Encounter (Signed)
Mrs Dethlefsen said pt has had rectal bleeding for over one month; she saw blood stains in underwear the size of silver dollar for a while now but thought might be hemorrhoids.pt is not at home now and Mrs Hansard is not sure if having abd pain or fever. Pt has been sitting around a lot lately. Pt had diarrhea a week ago but when pt told his wife this morning there was "right much blood in the commode" Mrs Manne asked pt if he had diarrhea and pt said no.pt not at home now so cannot get further info. Pt had colonoscopy 07/09/2008. Mrs Krumwiede does not think pt has been dizzy.  Dr [name] said to keep appt with her on 02/27/16 at 10 AM unless pt condition worsened; seeing a lot of blood, feeling light headed or BP drops. Pt should hold plavix until sees Dr [name] on 02/27/16. Mrs Degrande voiced understanding. FYI to Dr [name].

## 2016-02-27 ENCOUNTER — Encounter: Payer: Self-pay | Admitting: *Deleted

## 2016-02-27 ENCOUNTER — Other Ambulatory Visit (HOSPITAL_COMMUNITY): Payer: Self-pay | Admitting: Cardiology

## 2016-02-27 ENCOUNTER — Ambulatory Visit (INDEPENDENT_AMBULATORY_CARE_PROVIDER_SITE_OTHER): Payer: Medicare Other | Admitting: Family Medicine

## 2016-02-27 ENCOUNTER — Encounter: Payer: Self-pay | Admitting: Family Medicine

## 2016-02-27 VITALS — BP 120/60 | HR 60 | Temp 98.4°F | Ht 66.25 in | Wt 140.2 lb

## 2016-02-27 DIAGNOSIS — I6523 Occlusion and stenosis of bilateral carotid arteries: Secondary | ICD-10-CM | POA: Diagnosis not present

## 2016-02-27 DIAGNOSIS — K648 Other hemorrhoids: Secondary | ICD-10-CM

## 2016-02-27 DIAGNOSIS — K921 Melena: Secondary | ICD-10-CM | POA: Diagnosis not present

## 2016-02-27 MED ORDER — HYDROCORTISONE ACETATE 25 MG RE SUPP: 25.0000 mg | Freq: Every day | RECTAL | 0 refills | Status: DC

## 2016-02-27 NOTE — Progress Notes (Signed)
   Subjective:    Patient ID: Erik [name], male    DOB: 03-26-1940, 75 y.o.   MRN: [id number]  HPI   75 year old male with history of carotid stenosis, AA and CHF on plavix and ASA presents for new onset rectal bleeding in last week.  He reports he first noted 1 week ago..  bright red blood in toilet.. About thimble to 1/4 cup full. Occurs every BM. Has decreased in amount ion last few days but persistent.  No pain with BMs. No diarrhea, no constipation. No abdominal pain.  Slight nausea, no emesis.  No fever. No lighthededness, no dizziness.   BP Readings from Last 3 Encounters:  02/27/16 120/60  01/14/16 (!) 168/58  07/07/15 (!) 178/64    In past few years he has noted similar bleeding off and on for short time.  Colonoscopy: 2010: multiple polyps, diverticulosis.. No hemorrhoids noted. Repeat due in 2020.  Held plavix last night.   Review of Systems  Constitutional: Negative for chills and fatigue.  HENT: Negative for ear pain.   Eyes: Negative for pain.  Respiratory: Negative for shortness of breath.   Cardiovascular: Negative for chest pain.       Objective:   Physical Exam  Constitutional: Vital signs are normal. He appears well-developed and well-nourished.  HENT:  Head: Normocephalic.  Right Ear: Hearing normal.  Left Ear: Hearing normal.  Nose: Nose normal.  Mouth/Throat: Oropharynx is clear and moist and mucous membranes are normal.  Neck: Trachea normal. Carotid bruit is not present. No thyroid mass and no thyromegaly present.  Cardiovascular: Normal rate, regular rhythm and normal pulses.  Exam reveals no gallop, no distant heart sounds and no friction rub.   No murmur heard. No peripheral edema  Pulmonary/Chest: Effort normal and breath sounds normal. No respiratory distress.  Genitourinary: Rectal exam shows internal hemorrhoid and guaiac positive stool. Rectal exam shows no external hemorrhoid, no fissure, no mass, no tenderness and anal tone  normal.  Genitourinary Comments: Anoscopy performed and moderate bleedingf int hemorrhoids seen.  Skin: Skin is warm, dry and intact. No rash noted.  Psychiatric: He has a normal mood and affect. His speech is normal and behavior is normal. Thought content normal.          Assessment & Plan:

## 2016-02-27 NOTE — Progress Notes (Signed)
Pre visit review using our clinic review tool, if applicable. No additional management support is needed unless otherwise documented below in the visit note. 

## 2016-02-27 NOTE — Patient Instructions (Signed)
Keep stool soft with mirlax. Increase fiber and water in diet. Start rectal suppositories.  Hold plavix x 2  more days. Call if bleeding not stopping.

## 2016-02-27 NOTE — Assessment & Plan Note (Signed)
Due to internal hemorrhoids on blood thinners. Hold plavix and treat with topical steroid. Treat constipation with miralax.. Fiber and water.

## 2016-03-01 ENCOUNTER — Other Ambulatory Visit (HOSPITAL_COMMUNITY): Payer: Self-pay | Admitting: Cardiology

## 2016-03-01 ENCOUNTER — Other Ambulatory Visit (HOSPITAL_COMMUNITY): Payer: Self-pay | Admitting: Adult Health

## 2016-03-16 IMAGING — DX DG CHEST 2V
2 series · 2 of 2 positions shown · non-contrast
Comparison: 09/10/2013

CLINICAL DATA: Chest pain.  Smoker.

EXAM:
CHEST  2 VIEW

[chest pa]
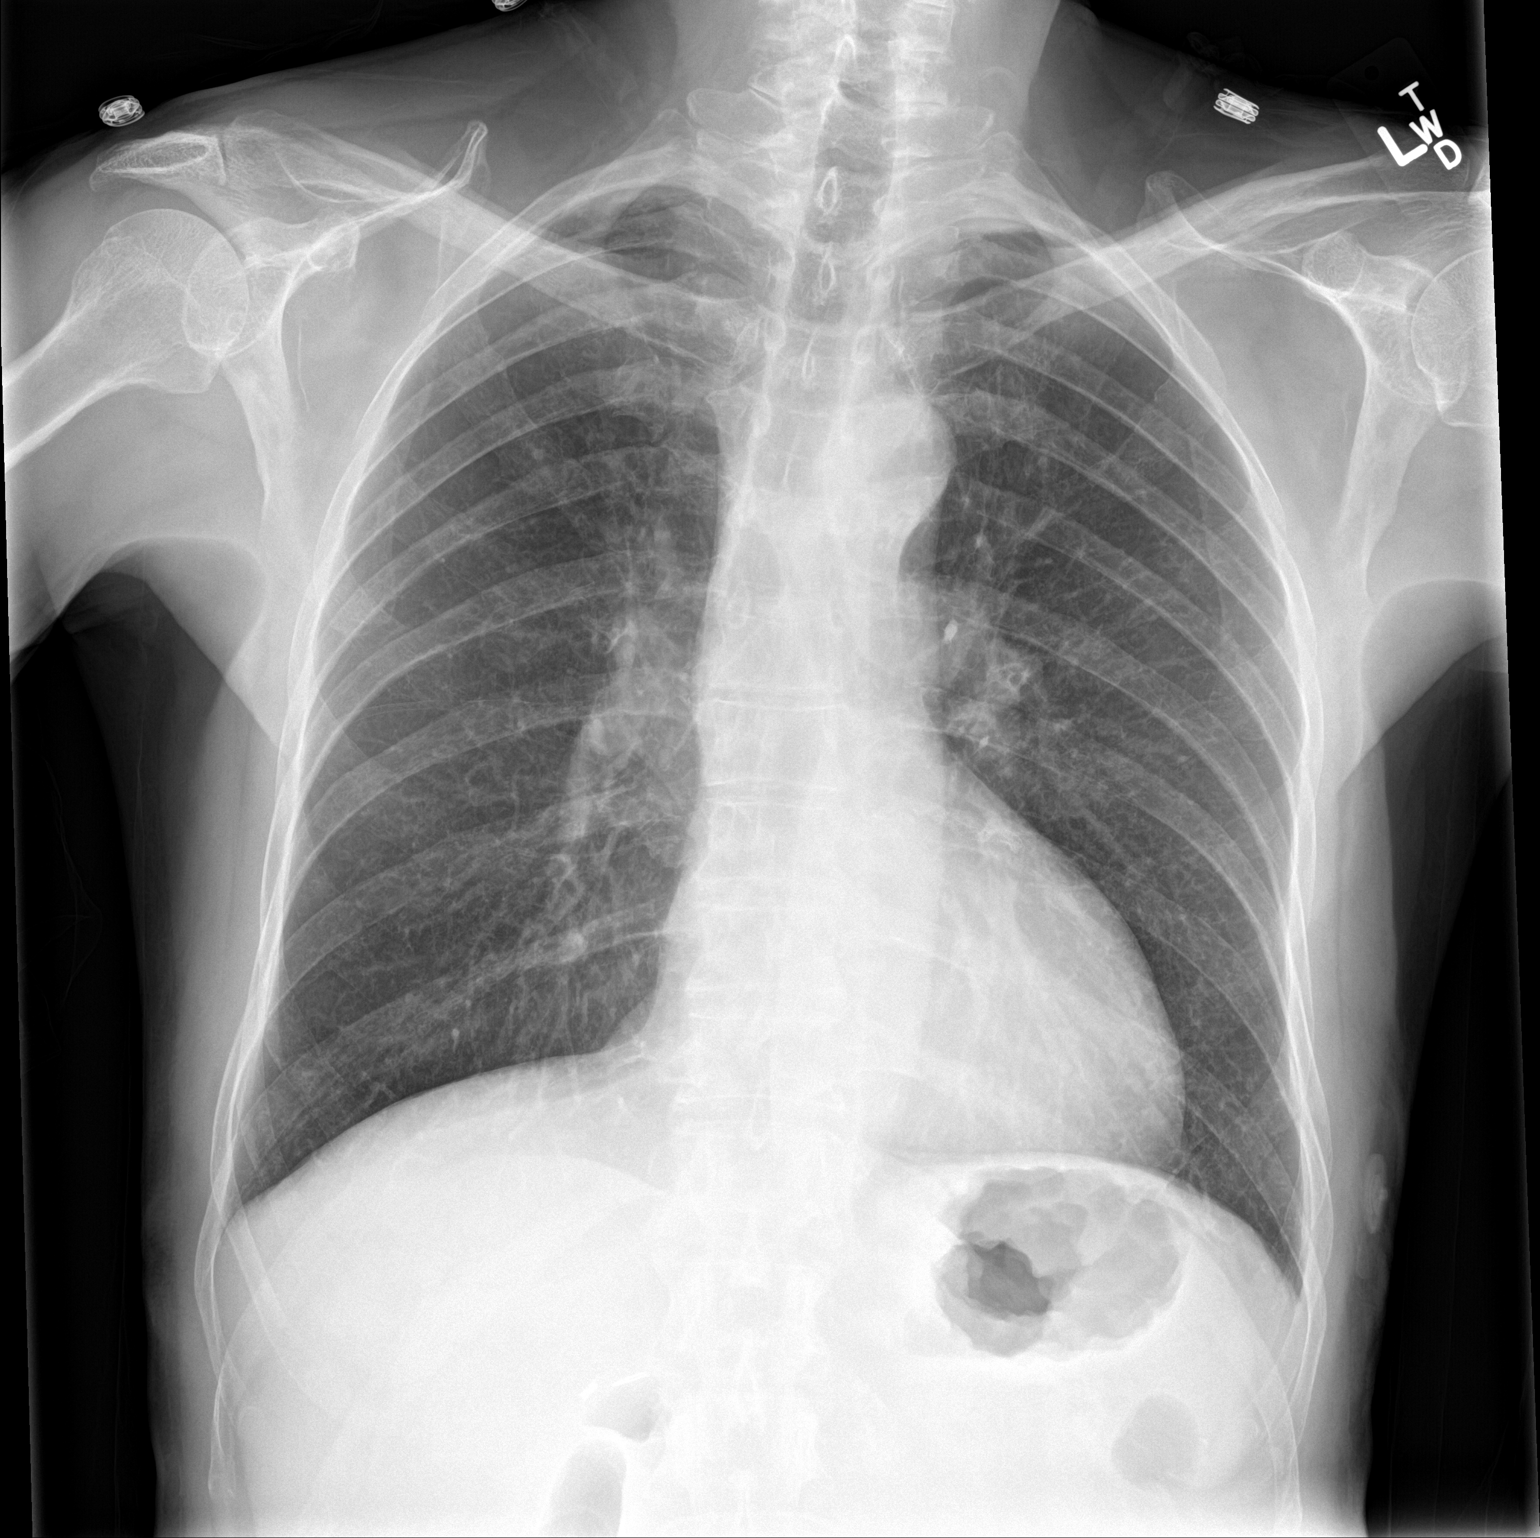

[chest lat]
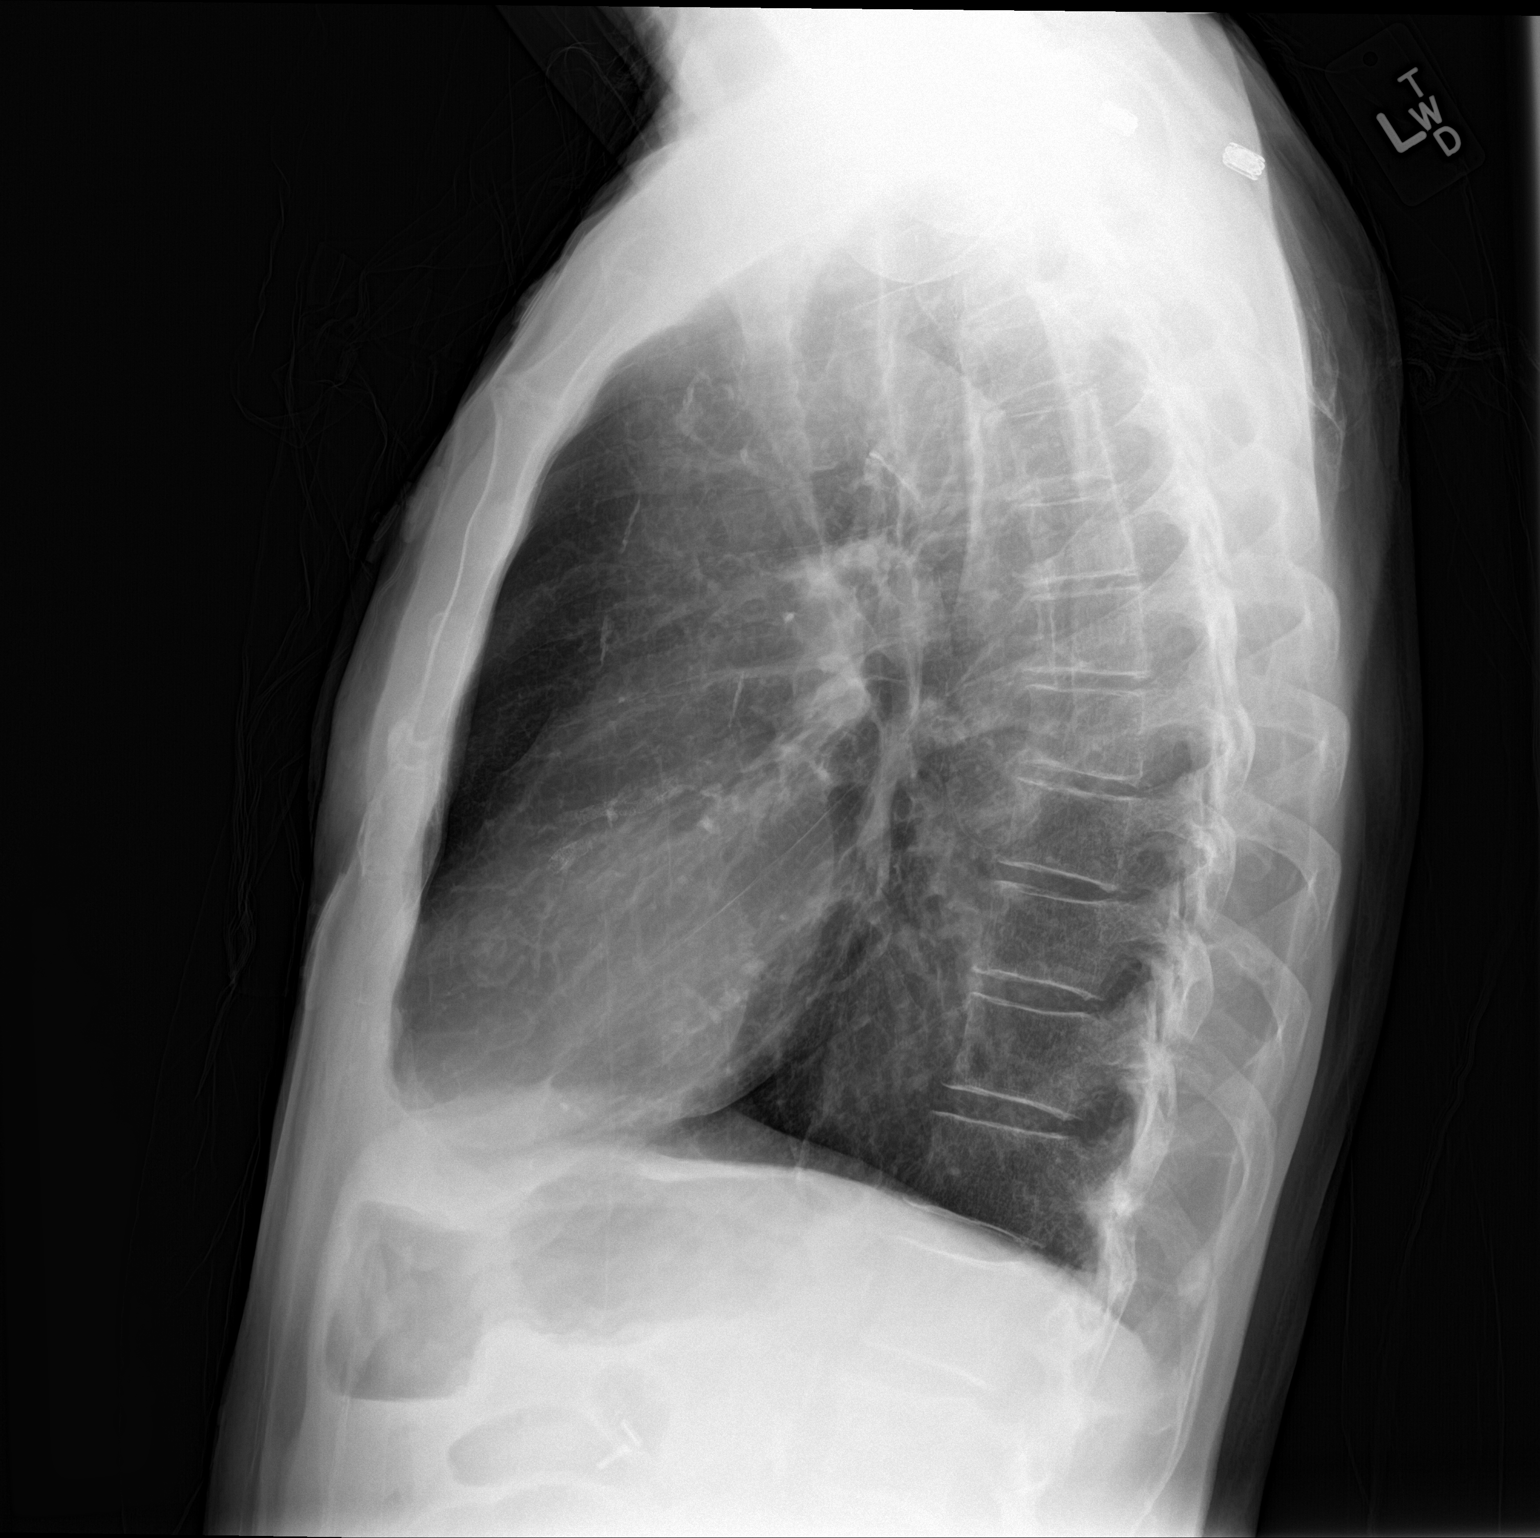

[2 of 2 positions shown; findings below may reference images not displayed]

FINDINGS: Heart and mediastinal contours are within normal limits. No focal
opacities or effusions. No acute bony abnormality.
IMPRESSION: No active cardiopulmonary disease.

## 2016-03-17 DIAGNOSIS — I714 Abdominal aortic aneurysm, without rupture: Secondary | ICD-10-CM | POA: Diagnosis not present

## 2016-03-17 DIAGNOSIS — I771 Stricture of artery: Secondary | ICD-10-CM | POA: Diagnosis not present

## 2016-03-17 DIAGNOSIS — R918 Other nonspecific abnormal finding of lung field: Secondary | ICD-10-CM | POA: Diagnosis not present

## 2016-03-17 DIAGNOSIS — J9811 Atelectasis: Secondary | ICD-10-CM | POA: Diagnosis not present

## 2016-03-29 ENCOUNTER — Other Ambulatory Visit (HOSPITAL_COMMUNITY): Payer: Self-pay | Admitting: Cardiology

## 2016-03-29 ENCOUNTER — Telehealth: Payer: Self-pay | Admitting: Family Medicine

## 2016-03-29 NOTE — Telephone Encounter (Signed)
Pts wife is calling to see if Dr. Zachery Dauer can refill medications prescribed [facility].  The meds are bisoporlol, fumarate 1 tab daily, lisinopril 5mg  .5 tabs, 2.5tabs daily.   Please call her to let her know if this can be done.  Thanks.

## 2016-03-30 NOTE — Telephone Encounter (Signed)
Left message for Mrs. Vogel that per Dr. [name],  given pt's complicated cardiac history .. she recommends that he continue to see cardiology at least yearly and have them refill his meds.

## 2016-03-30 NOTE — Telephone Encounter (Signed)
Given pt's complicated cardiac history .Marland Kitchen I recommend that he continue to see cardiology at least yearlyt and have them refill meds.

## 2016-04-07 ENCOUNTER — Telehealth: Payer: Self-pay | Admitting: Family Medicine

## 2016-04-07 DIAGNOSIS — R7303 Prediabetes: Secondary | ICD-10-CM

## 2016-04-07 DIAGNOSIS — E782 Mixed hyperlipidemia: Secondary | ICD-10-CM

## 2016-04-07 NOTE — Telephone Encounter (Signed)
-----   Message from Ellamae Sia sent at 04/06/2016  3:05 PM EST ----- Regarding: lab orders for Tuesday, 1.30.18  AWV lab orders, please.

## 2016-04-11 ENCOUNTER — Other Ambulatory Visit: Payer: Self-pay | Admitting: Cardiology

## 2016-04-13 ENCOUNTER — Other Ambulatory Visit (INDEPENDENT_AMBULATORY_CARE_PROVIDER_SITE_OTHER): Payer: Medicare Other

## 2016-04-13 DIAGNOSIS — R7303 Prediabetes: Secondary | ICD-10-CM | POA: Diagnosis not present

## 2016-04-13 DIAGNOSIS — E782 Mixed hyperlipidemia: Secondary | ICD-10-CM | POA: Diagnosis not present

## 2016-04-13 LAB — COMPREHENSIVE METABOLIC PANEL
ALBUMIN: 4 g/dL (ref 3.5–5.2)
ALK PHOS: 60 U/L (ref 39–117)
ALT: 10 U/L (ref 0–53)
AST: 13 U/L (ref 0–37)
BUN: 17 mg/dL (ref 6–23)
CALCIUM: 9.2 mg/dL (ref 8.4–10.5)
CHLORIDE: 106 meq/L (ref 96–112)
CO2: 32 mEq/L (ref 19–32)
CREATININE: 1 mg/dL (ref 0.40–1.50)
GFR: 77.46 mL/min (ref >60.00)
Glucose, Bld: 94 mg/dL (ref 70–99)
Potassium: 3.8 mEq/L (ref 3.5–5.1)
Sodium: 139 mEq/L (ref 135–145)
TOTAL PROTEIN: 7.5 g/dL (ref 6.0–8.3)
Total Bilirubin: 0.5 mg/dL (ref 0.2–1.2)

## 2016-04-13 LAB — LIPID PANEL
CHOLESTEROL: 117 mg/dL (ref 0–200)
HDL: 43.9 mg/dL (ref >39.00)
LDL Cholesterol: 51 mg/dL (ref 0–99)
NonHDL: 73.26
TRIGLYCERIDES: 112 mg/dL (ref 0.0–149.0)
Total CHOL/HDL Ratio: 3
VLDL: 22.4 mg/dL (ref 0.0–40.0)

## 2016-04-13 LAB — HEMOGLOBIN A1C: HEMOGLOBIN A1C: 6 % (ref 4.6–6.5)

## 2016-04-14 ENCOUNTER — Other Ambulatory Visit: Payer: Self-pay | Admitting: Family Medicine

## 2016-04-20 ENCOUNTER — Encounter: Payer: Self-pay | Admitting: Family Medicine

## 2016-04-20 ENCOUNTER — Ambulatory Visit (INDEPENDENT_AMBULATORY_CARE_PROVIDER_SITE_OTHER): Payer: Medicare Other | Admitting: Family Medicine

## 2016-04-20 VITALS — BP 164/60 | HR 61 | Temp 97.8°F | Ht 66.25 in | Wt 140.8 lb

## 2016-04-20 DIAGNOSIS — E782 Mixed hyperlipidemia: Secondary | ICD-10-CM

## 2016-04-20 DIAGNOSIS — N400 Enlarged prostate without lower urinary tract symptoms: Secondary | ICD-10-CM | POA: Diagnosis not present

## 2016-04-20 DIAGNOSIS — I255 Ischemic cardiomyopathy: Secondary | ICD-10-CM

## 2016-04-20 DIAGNOSIS — J449 Chronic obstructive pulmonary disease, unspecified: Secondary | ICD-10-CM

## 2016-04-20 DIAGNOSIS — I714 Abdominal aortic aneurysm, without rupture, unspecified: Secondary | ICD-10-CM

## 2016-04-20 DIAGNOSIS — I5022 Chronic systolic (congestive) heart failure: Secondary | ICD-10-CM

## 2016-04-20 DIAGNOSIS — I6523 Occlusion and stenosis of bilateral carotid arteries: Secondary | ICD-10-CM

## 2016-04-20 DIAGNOSIS — J42 Unspecified chronic bronchitis: Secondary | ICD-10-CM

## 2016-04-20 DIAGNOSIS — I10 Essential (primary) hypertension: Secondary | ICD-10-CM | POA: Diagnosis not present

## 2016-04-20 DIAGNOSIS — R7303 Prediabetes: Secondary | ICD-10-CM

## 2016-04-20 DIAGNOSIS — Z0000 Encounter for general adult medical examination without abnormal findings: Secondary | ICD-10-CM

## 2016-04-20 NOTE — Assessment & Plan Note (Signed)
Mild. Minimal symptoms on no medication. Pt is not interested in inhalers.

## 2016-04-20 NOTE — Assessment & Plan Note (Signed)
Pt has some element of white coat HTN. Improved control of BP on current med at home as well as at last OV.

## 2016-04-20 NOTE — Assessment & Plan Note (Signed)
Excellent control on vytorin. Low chol diet.

## 2016-04-20 NOTE — Assessment & Plan Note (Signed)
Euvolemic on current meds. ECHO 2017 improved EF.

## 2016-04-20 NOTE — Patient Instructions (Addendum)
Complete and bring back advance directive packet.  Try to be as active as possible.. reccomend 3-5 times a week getting cardiovascular exercise.  Keep working on low chol and low sugar diet.

## 2016-04-20 NOTE — Progress Notes (Signed)
Subjective:    Patient ID: Erik [name], male    DOB: January 23, 1941, 76 y.o.   MRN: [id number]  HPI   I have personally reviewed the Medicare Annual Wellness questionnaire and have noted 1. The patient's medical and social history 2. Their use of alcohol, tobacco or illicit drugs 3. Their current medications and supplements 4. The patient's functional ability including ADL's, fall risks, home safety risks and hearing or visual             impairment. 5. Diet and physical activities 6. Evidence for depression or mood disorders 7.         Updated provider list Cognitive evaluation was performed and recorded on pt medicare questionnaire form. The patients weight, height, BMI and visual acuity have been recorded in the chart  I have made referrals, counseling and provided education to the patient based review of the above and I have provided the pt with a written personalized care plan for preventive services.    No further hemorhoids.  No further straining with Bms on miralax occ.    Hypertension:   inadequate control  On bisoprolol, lisinopril 2.5 mg daily.  he has an element of white coat HTN BP Readings from Last 3 Encounters:  04/20/16 (!) 164/60  02/27/16 120/60  01/14/16 (!) 168/58  Using medication without problems or lightheadedness:  None Chest pain with exertion: None Edema:none Short of breath: None Average home BPs: at home 145/65 Other issues:  Elevated Cholesterol:  LDL at goal on vytorin 10/40 mg daily. Lab Results  Component Value Date   CHOL 117 04/13/2016   HDL 43.90 04/13/2016   LDLCALC 51 04/13/2016   LDLDIRECT 176.9 05/09/2008   TRIG 112.0 04/13/2016   CHOLHDL 3 04/13/2016  Using medications without problems: None Muscle aches: None Diet compliance: Exercise: none Other complaints:   prediabetes:  Lab Results  Component Value Date   HGBA1C 6.0 04/13/2016    B carotid stenosis, claudication:L followed by Vascular MD, Osborne Oman NP.Marland Kitchen  Last OV 01/14/2016 AAA:  Followed at [facility] by Dr. Corky Sox, s/p  renal stent ( 2-vessel FEVAR with a Z-Fen device on May 28, 2015 by Dr. 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 03/17/2106 CT reviewed with pt in detail.   CAD, CHF followed by cards.  Dr. Claiborne Billings S/ P cath 2015.  ECHo 2017 EF 55-60%  COPD , mild: occ cough with recent cold symptoms. No SOB.Last spirometry 04/18/2015 on no meds.   BPH: stable control on flomax   Social History /Family History/Past Medical History reviewed and updated if needed.  Review of Systems  Constitutional: Negative for fatigue and fever.  HENT: Negative for ear pain.   Eyes: Negative for pain.  Respiratory: Negative for cough and shortness of breath.   Cardiovascular: Negative for chest pain, palpitations and leg swelling.  Gastrointestinal: Negative for abdominal pain.  Genitourinary: Negative for dysuria.  Musculoskeletal: Negative for arthralgias.  Neurological: Negative for syncope, light-headedness and headaches.  Psychiatric/Behavioral: Negative for dysphoric mood.       Objective:   Physical Exam  Constitutional: Vital signs are normal. He appears well-developed and well-nourished.  HENT:  Head: Normocephalic.  Right Ear: Hearing normal.  Left Ear: Hearing normal.  Nose: Nose normal.  Mouth/Throat: Oropharynx is clear and moist and mucous membranes are normal.  Neck: Trachea normal. Carotid bruit is not present. No thyroid mass and  no thyromegaly present.  Cardiovascular: Normal rate, regular rhythm and normal pulses.  Exam reveals no gallop, no distant heart sounds and no friction rub.   No murmur heard. No peripheral edema  Pulmonary/Chest: Effort normal and breath sounds normal. No respiratory distress.  Skin: Skin is warm, dry and intact. No rash noted.  Psychiatric: He has a normal mood and affect. His speech is normal and behavior is  normal. Thought content normal.          Assessment & Plan:    The patient's preventative maintenance and recommended screening tests for an annual wellness exam were reviewed in full today. Brought up to date unless services declined.  Counselled on the importance of diet, exercise, and its role in overall health and mortality. The patient's FH and SH was reviewed, including their home life, tobacco status, and drug and alcohol status.   Td, PNA: Up to date. Refused flu. Shingles:not interested   PSA: Hx of BPH on tamulosin. PSA not indicated  Colon: 06/2008 Dr. Sharlett Iles, repeat 10 years hyperplastic polyps  Smoker: spirometry performed 2017.. Breathing better since quit smoking. CT lung cancer screen: 04/2014 no change in nodules. Recommend yearly low dose screen. CT at [facility]: showed no change in  Pulmonary nodules

## 2016-04-20 NOTE — Assessment & Plan Note (Signed)
Reviewed CT from [facility].. Stable stent.

## 2016-04-20 NOTE — Progress Notes (Signed)
Pre visit review using our clinic review tool, if applicable. No additional management support is needed unless otherwise documented below in the visit note. 

## 2016-04-20 NOTE — Assessment & Plan Note (Signed)
Low sugar diet.  °

## 2016-05-04 ENCOUNTER — Ambulatory Visit (INDEPENDENT_AMBULATORY_CARE_PROVIDER_SITE_OTHER): Payer: Medicare Other | Admitting: Family Medicine

## 2016-05-04 ENCOUNTER — Encounter: Payer: Self-pay | Admitting: Family Medicine

## 2016-05-04 DIAGNOSIS — J111 Influenza due to unidentified influenza virus with other respiratory manifestations: Secondary | ICD-10-CM | POA: Diagnosis not present

## 2016-05-04 DIAGNOSIS — I255 Ischemic cardiomyopathy: Secondary | ICD-10-CM

## 2016-05-04 MED ORDER — OSELTAMIVIR PHOSPHATE 75 MG PO CAPS: 75.0000 mg | ORAL_CAPSULE | Freq: Two times a day (BID) | ORAL | 0 refills | Status: DC

## 2016-05-04 NOTE — Patient Instructions (Addendum)
For cough, use mucinex DM ( no D, decongestant).  Rest, fluids. Tylenol for fever and body aches as needed. Complete course of tamiflu x 5 days.   Call if not improving over the next 4-5 days, some.  Go to ER if severe shortness of breath.

## 2016-05-04 NOTE — Progress Notes (Signed)
Pre visit review using our clinic review tool, if applicable. No additional management support is needed unless otherwise documented below in the visit note. 

## 2016-05-04 NOTE — Progress Notes (Signed)
   Subjective:    Patient ID: Erik [name], male    DOB: 26-Sep-1940, 76 y.o.   MRN: [id number]  Cough  The current episode started in the past 7 days. The problem has been rapidly worsening. The problem occurs constantly. The cough is non-productive. Associated symptoms include chills, a fever, myalgias and shortness of breath. Pertinent negatives include no ear pain, nasal congestion, sore throat or wheezing. Associated symptoms comments: 100 F yesterday. The symptoms are aggravated by lying down. Risk factors for lung disease include smoking/tobacco exposure ( former smoker). He has tried nothing for the symptoms. His past medical history is significant for COPD. CAD  Fever   Associated symptoms include coughing and diarrhea. Pertinent negatives include no ear pain, sore throat or wheezing.  Diarrhea   Associated symptoms include chills, coughing, a fever and myalgias. CAD    No abd pain, no N/V.   wife with viral infection. Daughter had flu 2 weeks ago.. Lives with them.   Review of Systems  Constitutional: Positive for chills and fever.  HENT: Negative for ear pain and sore throat.   Respiratory: Positive for cough and shortness of breath. Negative for wheezing.   Gastrointestinal: Positive for diarrhea.  Musculoskeletal: Positive for myalgias.   Blood pressure (!) 120/47, pulse 66, temperature 98.5 F (36.9 C), temperature source Oral, height 5' 6.25" (1.683 m), weight 140 lb 12 oz (63.8 kg).     Objective:   Physical Exam  Constitutional: Vital signs are normal. He appears well-developed and well-nourished.  Non-toxic appearance. He appears ill. No distress.  HENT:  Head: Normocephalic and atraumatic.  Right Ear: Hearing, tympanic membrane, external ear and ear canal normal. No tenderness. No foreign bodies. Tympanic membrane is not retracted and not bulging.  Left Ear: Hearing, tympanic membrane, external ear and ear canal normal. No tenderness. No foreign bodies. Tympanic  membrane is not retracted and not bulging.  Nose: Nose normal. No mucosal edema or rhinorrhea. Right sinus exhibits no maxillary sinus tenderness and no frontal sinus tenderness. Left sinus exhibits no maxillary sinus tenderness and no frontal sinus tenderness.  Mouth/Throat: Uvula is midline, oropharynx is clear and moist and mucous membranes are normal. Normal dentition. No dental caries. No oropharyngeal exudate or tonsillar abscesses.  Eyes: Conjunctivae, EOM and lids are normal. Pupils are equal, round, and reactive to light. Lids are everted and swept, no foreign bodies found.  Neck: Trachea normal, normal range of motion and phonation normal. Neck supple. Carotid bruit is not present. No thyroid mass and no thyromegaly present.  Cardiovascular: Normal rate, regular rhythm, S1 normal, S2 normal, intact distal pulses and normal pulses.  Exam reveals no gallop.   Murmur heard. Pulmonary/Chest: Effort normal and breath sounds normal. No respiratory distress. He has no decreased breath sounds. He has no wheezes. He has no rhonchi. He has no rales.   Good air movement  Abdominal: Soft. Normal appearance and bowel sounds are normal. There is no hepatosplenomegaly. There is no tenderness. There is no rebound, no guarding and no CVA tenderness. No hernia.  Neurological: He is alert. He has normal reflexes.  Skin: Skin is warm, dry and intact. No rash noted.  Psychiatric: He has a normal mood and affect. His speech is normal and behavior is normal. Judgment normal.          Assessment & Plan:

## 2016-05-04 NOTE — Assessment & Plan Note (Signed)
High pretest probability of flu. Pt high risk with hx of COPD and CAD.  Treat with tamiflu.   no current sign of COPD exac or bacterial infection. No current indication for prednisone.

## 2016-05-13 ENCOUNTER — Ambulatory Visit (INDEPENDENT_AMBULATORY_CARE_PROVIDER_SITE_OTHER): Payer: Medicare Other | Admitting: Family Medicine

## 2016-05-13 ENCOUNTER — Encounter: Payer: Self-pay | Admitting: Family Medicine

## 2016-05-13 VITALS — BP 129/61 | HR 59 | Temp 98.3°F | Ht 66.25 in | Wt 135.8 lb

## 2016-05-13 DIAGNOSIS — I255 Ischemic cardiomyopathy: Secondary | ICD-10-CM

## 2016-05-13 DIAGNOSIS — R0781 Pleurodynia: Secondary | ICD-10-CM | POA: Diagnosis not present

## 2016-05-13 NOTE — Progress Notes (Signed)
Dr. Frederico Hamman T. Raekwan Spelman, MD, [facility] [place], [address] Phone: [phone] Fax: [phone]  05/13/2016  Patient: Erik [name], MRN: [id number], DOB: [birth year], 76 y.o.  Primary Physician:  [name], MD   Chief Complaint  Patient presents with  . Pain in Rib Cage    Left side-Hurts to take a deep breath   Subjective:   Erik [name] is a 76 y.o. very pleasant male patient who presents with the following:  Very recently had the flu within the last 2 weeks. Now having rib pain - pain with breathing, but much better compared to yesterday. Rib cage, more on the left.  Doing a lot better than last week.   125/40's.  Costochondritis, improved  Past Medical History, Surgical History, Social History, Family History, Problem List, Medications, and Allergies have been reviewed and updated if relevant.  Patient Active Problem List   Diagnosis Date Noted  . Influenza 05/04/2016  . Hematochezia 02/27/2016  . Nocturia 07/13/2015  . Abdominal aortic aneurysm (AAA) ([place]) 07/07/2015  . Arteriosclerosis of coronary artery 07/07/2015  . Chronic obstructive pulmonary disease ([place]) 07/07/2015  . Cardiomyopathy, ischemic 07/07/2015  . 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  . Encounter for Foley catheter removal 06/03/2015  . Solitary pulmonary nodule 04/16/2014  . Counseling regarding end of life decision making 04/16/2014  . AAA (abdominal aortic aneurysm) without rupture ([place]) 12/18/2013  . HTN (hypertension) 10/03/2013  . CAD S/P percutaneous coronary angioplasty 09/19/2013  . Chronic systolic heart failure ([place]) 09/19/2013  . NSTEMI (non-ST elevated myocardial infarction) ([place]) 09/10/2013  . Family history of coronary artery disease in brother 09/10/2013  . ED (erectile dysfunction) 12/10/2011  . COPD, mild ([place]) 06/20/2008  . COLONIC POLYPS, ADENOMATOUS 06/05/2008  . BPH (benign  prostatic hyperplasia) 06/05/2008  . Prediabetes 05/14/2008  . VENEREAL WART 05/07/2008  . ONYCHOMYCOSIS, TOENAILS 05/07/2008  . Hyperlipidemia 05/07/2008  . Bilateral carotid artery stenosis 05/07/2008  . INTERMITTENT CLAUDICATION 05/07/2008  . HEART MURMUR, HX OF 05/07/2008  . COLONIC POLYPS, HX OF 05/07/2008    Past Medical History:  Diagnosis Date  . BPH (benign prostatic hypertrophy)   . Carotid artery stenosis    a. Bilateral CEA  . Chronic systolic heart failure (HCC)    a. EF 20-25%, mild LVH, mod HK, mid apicalanteroseptal myocardium, mild MR, LA mod dilated  . Collagen vascular disease ([place])   . COPD (chronic obstructive pulmonary disease) ([place])   . Coronary artery disease    a. LHC (08/2013): Lmain: short 30% distal, LAD: sml D1 & D2, 70% ostial D1, 95-99% LAD stenosis prox D2 LCx: sml/mod ramus subtot. occluded, 40% ostial set off lg OM1, 40% AV LCx after OM1, RCA: 90% prox (DES to RCA and prox LAD)  . Heart murmur   . History of colonic polyps   . Hyperlipidemia   . Hypertension   . Ischemic cardiomyopathy     Past Surgical History:  Procedure Laterality Date  . cardiac stents  09-2013  . CAROTID ENDARTERECTOMY  04/17/2008   right  . CAROTID ENDARTERECTOMY  05/30/08   Left  . CHOLECYSTECTOMY     Gall Bladder  . CORONARY ANGIOGRAM  09/11/2013   Procedure: CORONARY ANGIOGRAM;  Surgeon: Troy Sine, MD;  Location[facility] C[facility] LAB;  Service: Cardiovascular;;  . HERNIA REPAIR    . lower aorta aneurysm  05/26/15  . PERCUTANEOUS STENT INTERVENTION  09/11/2013   Procedure: PERCUTANEOUS  STENT INTERVENTION;  Surgeon: Troy Sine, MD;  Location: [facility] CATH LAB;  Service: Cardiovascular;;  DES Prox RCA 3.5x15 xience     Social History   Social History  . Marital status: Married    Spouse name: N/A  . Number of children: N/A  . Years of education: N/A   Occupational History  . Retired Development worker, community    Social History Main Topics  . Smoking status: Former Smoker     Packs/day: 1.50    Years: 0.50    Types: Cigarettes    Quit date: 08/27/2013  . Smokeless tobacco: Former Systems developer    Quit date: 09/10/2013  . Alcohol use No  . Drug use: No  . Sexual activity: Not on file   Other Topics Concern  . Not on file   Social History Narrative   Lives with wife in [place]. Retired from the post office       Family History  Problem Relation Age of Onset  . Heart disease Brother   . Hyperlipidemia Brother   . Hypertension Mother   . Cancer Father   . Hyperlipidemia Brother   . Hyperlipidemia Brother   . Heart attack Brother   . Hyperlipidemia Brother   . Multiple sclerosis Brother   . Kidney disease Neg Hx   . Prostate cancer Neg Hx     Allergies  Allergen Reactions  . Codeine Swelling    REACTION: Throat swells    Medication list reviewed and updated in full in [place].  GEN: No fevers, chills. Nontoxic. Primarily MSK c/o today. MSK: Detailed in the HPI GI: tolerating PO intake without difficulty Neuro: No numbness, parasthesias, or tingling associated. Otherwise the pertinent positives of the ROS are noted above.   Objective:   BP 129/61   Pulse (!) 59   Temp 98.3 F (36.8 C) (Oral)   Ht 5' 6.25" (1.683 m)   Wt 135 lb 12 oz (61.6 kg)   SpO2 95%   BMI 21.75 kg/m    GEN: WDWN, NAD, Non-toxic, Alert & Oriented x 3 HEENT: Atraumatic, Normocephalic.  Ears and Nose: No external deformity. EXTR: No clubbing/cyanosis/edema Chest wall essentially NT NEURO: Normal gait.  PSYCH: Normally interactive. Conversant. Not depressed or anxious appearing.  Calm demeanor.    Radiology: No results found.  Assessment and Plan:   Rib pain on left side  Reassurance.  Arty improving quite a bit.  On Plavix.  Tylenol if needed for pain.  Expect will improve with continued time.  Follow-up: No Follow-up on file.  Medications Discontinued During This Encounter  Medication Reason  . oseltamivir (TAMIFLU) 75 MG capsule Completed  Course   No orders of the defined types were placed in this encounter.   Signed,  Maud Deed. Kjersten Ormiston, MD   Allergies as of 05/13/2016      Reactions   Codeine Swelling   REACTION: Throat swells      Medication List       Accurate as of 05/13/16 11:59 PM. Always use your most recent med list.          acetaminophen 325 MG tablet Commonly known as:  TYLENOL Take 325 mg by mouth.   aspirin 81 MG tablet Take 81 mg by mouth daily.   bisoprolol 5 MG tablet Commonly known as:  ZEBETA TAKE 1 TABLET (5 MG TOTAL) BY MOUTH DAILY.   clopidogrel 75 MG tablet Commonly known as:  PLAVIX Take 1 tablet (75 mg total) by mouth daily.  ezetimibe-simvastatin 10-40 MG tablet Commonly known as:  VYTORIN TAKE 1 TABLET BY MOUTH AT BEDTIME.   famotidine 20 MG tablet Commonly known as:  PEPCID TAKE 1 TABLET (20 MG TOTAL) BY MOUTH 2 (TWO) TIMES DAILY.   furosemide 20 MG tablet Commonly known as:  LASIX TAKE 1 TABLET (20 MG TOTAL) BY MOUTH DAILY.   hydrocortisone 25 MG suppository Commonly known as:  ANUSOL-HC Place 1 suppository (25 mg total) rectally daily.   lisinopril 5 MG tablet Commonly known as:  PRINIVIL,ZESTRIL TAKE 0.5 TABLETS (2.5 MG TOTAL) BY MOUTH DAILY.   nitroGLYCERIN 0.4 MG SL tablet Commonly known as:  NITROSTAT Place 1 tablet (0.4 mg total) under the tongue every 5 (five) minutes x 3 doses as needed for chest pain.   omega-3 acid ethyl esters 1 g capsule Commonly known as:  LOVAZA Take by mouth. Reported on 06/05/2015   oxyCODONE-acetaminophen 5-325 MG tablet Commonly known as:  PERCOCET/ROXICET Take 1 tablet by mouth every 6 (six) hours as needed for severe pain.   tamsulosin 0.4 MG Caps capsule Commonly known as:  FLOMAX TAKE 1 CAPSULE (0.4 MG TOTAL) BY MOUTH DAILY.

## 2016-05-13 NOTE — Progress Notes (Signed)
Pre visit review using our clinic review tool, if applicable. No additional management support is needed unless otherwise documented below in the visit note. 

## 2016-06-08 ENCOUNTER — Other Ambulatory Visit: Payer: Self-pay | Admitting: Cardiology

## 2016-06-16 ENCOUNTER — Other Ambulatory Visit: Payer: Self-pay | Admitting: *Deleted

## 2016-06-16 MED ORDER — CLOPIDOGREL BISULFATE 75 MG PO TABS: 75.0000 mg | ORAL_TABLET | Freq: Every day | ORAL | 3 refills | Status: DC

## 2016-07-05 NOTE — Progress Notes (Signed)
10:23 AM   Erik [name] Jul 11, 1940 [id number]  Referring provider: [name], MD [address] [place], [place] [address]  Chief Complaint  Patient presents with  . Benign Prostatic Hypertrophy    1 year follow up   . Urinary Retention    HPI: Patient is a 76 year old Caucasian male who had an episode of acute urinary retention after an aortic aneurysm repair and BPH with LU TS who presents today for a one year follow up.    History of acute urinary retention Patient had abdominal aortic aneurysm repair on 05/25/2015.  On post-operative day two,  he was unable to void after foley cath removal.  He then stated he tried to urinate on his own, twice and failed.  He was then discharged with a foley.  He has been on tamsulosin for the last 3 to 4 years.  He states he has not had any difficulty with urination prior to surgery.  He feels the reason he failed his voiding trial was that they attempted to discharge him too early.  He states he has been voiding well since the Foley was removed.  He is taking the tamsulosin 0.4 mg daily.    BPH WITH LUTS His IPSS score today is 13, which is moderate lower urinary tract symptomatology. He is mostly satisfied with his quality life due to his urinary symptoms.  His PVR is 130 mL.  His previous I PSS score 17/3.  His previous PVR was 53 mL.  His major complaint at this time is nocturia x 3.  He denies any dysuria, hematuria or suprapubic pain.   He currently taking tamsulosin 0.4 mg daily.   He also denies any recent fevers, chills, nausea or vomiting.  He does not have a family history of PCa.      IPSS    Row Name 07/06/16 1000         International Prostate Symptom Score   How often have you had the sensation of not emptying your bladder? Less than half the time     How often have you had to urinate less than every two hours? About half the time     How often have you found you stopped and started again several times when you  urinated? Less than half the time     How often have you found it difficult to postpone urination? Less than 1 in 5 times     How often have you had a weak urinary stream? Less than half the time     How often have you had to strain to start urination? Not at All     How many times did you typically get up at night to urinate? 3 Times     Total IPSS Score 13       Quality of Life due to urinary symptoms   If you were to spend the rest of your life with your urinary condition just the way it is now how would you feel about that? Mostly Satisfied        Score:  1-7 Mild 8-19 Moderate 20-35 Severe    PMH: Past Medical History:  Diagnosis Date  . BPH (benign prostatic hypertrophy)   . Carotid artery stenosis    a. Bilateral CEA  . Chronic systolic heart failure (HCC)    a. EF 20-25%, mild LVH, mod HK, mid apicalanteroseptal myocardium, mild MR, LA mod dilated  . Collagen vascular disease ([place])   .  COPD (chronic obstructive pulmonary disease) ([place])   . Coronary artery disease    a. LHC (08/2013): Lmain: short 30% distal, LAD: sml D1 & D2, 70% ostial D1, 95-99% LAD stenosis prox D2 LCx: sml/mod ramus subtot. occluded, 40% ostial set off lg OM1, 40% AV LCx after OM1, RCA: 90% prox (DES to RCA and prox LAD)  . Heart murmur   . History of colonic polyps   . Hyperlipidemia   . Hypertension   . Ischemic cardiomyopathy     Surgical History: Past Surgical History:  Procedure Laterality Date  . cardiac stents  09-2013  . CAROTID ENDARTERECTOMY  04/17/2008   right  . CAROTID ENDARTERECTOMY  05/30/08   Left  . CHOLECYSTECTOMY     Gall Bladder  . CORONARY ANGIOGRAM  09/11/2013   Procedure: CORONARY ANGIOGRAM;  Surgeon: Troy Sine, MD;  Location: [facility] CATH LAB;  Service: Cardiovascular;;  . HERNIA REPAIR    . lower aorta aneurysm  05/26/15  . PERCUTANEOUS STENT INTERVENTION  09/11/2013   Procedure: PERCUTANEOUS STENT INTERVENTION;  Surgeon: Troy Sine, MD;  Location: [facility] CATH LAB;   Service: Cardiovascular;;  DES Prox RCA 3.5x15 xience     Home Medications:  Allergies as of 07/06/2016      Reactions   Codeine Swelling   REACTION: Throat swells      Medication List       Accurate as of 07/06/16 10:23 AM. Always use your most recent med list.          acetaminophen 325 MG tablet Commonly known as:  TYLENOL Take 325 mg by mouth.   aspirin 81 MG tablet Take 81 mg by mouth daily.   bisoprolol 5 MG tablet Commonly known as:  ZEBETA TAKE 1 TABLET (5 MG TOTAL) BY MOUTH DAILY.   clopidogrel 75 MG tablet Commonly known as:  PLAVIX Take 1 tablet (75 mg total) by mouth daily.   ezetimibe-simvastatin 10-40 MG tablet Commonly known as:  VYTORIN TAKE 1 TABLET BY MOUTH AT BEDTIME.   famotidine 20 MG tablet Commonly known as:  PEPCID TAKE 1 TABLET (20 MG TOTAL) BY MOUTH 2 (TWO) TIMES DAILY.   furosemide 20 MG tablet Commonly known as:  LASIX TAKE 1 TABLET (20 MG TOTAL) BY MOUTH DAILY.   hydrocortisone 25 MG suppository Commonly known as:  ANUSOL-HC Place 1 suppository (25 mg total) rectally daily.   lisinopril 5 MG tablet Commonly known as:  PRINIVIL,ZESTRIL TAKE 0.5 TABLETS (2.5 MG TOTAL) BY MOUTH DAILY.   nitroGLYCERIN 0.4 MG SL tablet Commonly known as:  NITROSTAT Place 1 tablet (0.4 mg total) under the tongue every 5 (five) minutes x 3 doses as needed for chest pain.   omega-3 acid ethyl esters 1 g capsule Commonly known as:  LOVAZA Take by mouth. Reported on 06/05/2015   oxyCODONE-acetaminophen 5-325 MG tablet Commonly known as:  PERCOCET/ROXICET Take 1 tablet by mouth every 6 (six) hours as needed for severe pain.   tamsulosin 0.4 MG Caps capsule Commonly known as:  FLOMAX TAKE 1 CAPSULE (0.4 MG TOTAL) BY MOUTH DAILY.       Allergies:  Allergies  Allergen Reactions  . Codeine Swelling    REACTION: Throat swells    Family History: Family History  Problem Relation Age of Onset  . Heart disease Brother   . Hyperlipidemia  Brother   . Hypertension Mother   . Cancer Father   . Hyperlipidemia Brother   . Hyperlipidemia Brother   . Heart attack Brother   .  Hyperlipidemia Brother   . Multiple sclerosis Brother   . Kidney disease Neg Hx   . Prostate cancer Neg Hx     Social History:  reports that he quit smoking about 2 years ago. His smoking use included Cigarettes. He has a 0.75 pack-year smoking history. He quit smokeless tobacco use about 2 years ago. He reports that he does not drink alcohol or use drugs.  ROS: UROLOGY Frequent Urination?: Yes Hard to postpone urination?: No Burning/pain with urination?: No Get up at night to urinate?: Yes Leakage of urine?: No Urine stream starts and stops?: Yes Trouble starting stream?: No Do you have to strain to urinate?: No Blood in urine?: No Urinary tract infection?: No Sexually transmitted disease?: No Injury to kidneys or bladder?: No Painful intercourse?: No Weak stream?: No Erection problems?: No Penile pain?: No  Gastrointestinal Nausea?: No Vomiting?: No Indigestion/heartburn?: No Diarrhea?: No Constipation?: No  Constitutional Fever: No Night sweats?: No Weight loss?: No Fatigue?: No  Skin Skin rash/lesions?: No Itching?: No  Eyes Blurred vision?: No Double vision?: No  Ears/Nose/Throat Sore throat?: No Sinus problems?: No  Hematologic/Lymphatic Swollen glands?: No Easy bruising?: Yes  Cardiovascular Leg swelling?: Yes Chest pain?: No  Respiratory Cough?: No Shortness of breath?: No  Endocrine Excessive thirst?: No  Musculoskeletal Back pain?: No Joint pain?: No  Neurological Headaches?: No Dizziness?: No  Psychologic Depression?: No Anxiety?: No  Physical Exam: BP 136/63   Pulse 62   Ht 5\' 6"  (1.676 m)   Wt 140 lb 8 oz (63.7 kg)   BMI 22.68 kg/m   Constitutional: Well nourished. Alert and oriented, No acute distress. HEENT: [place] AT, moist mucus membranes. Trachea midline, no  masses. Cardiovascular: No clubbing, cyanosis, or edema. Respiratory: Normal respiratory effort, no increased work of breathing. GI: Abdomen is soft, non tender, non distended, no abdominal masses. Liver and spleen not palpable.  No hernias appreciated.  Stool sample for occult testing is not indicated.   GU: No CVA tenderness.  No bladder fullness or masses.  Patient with circumcised phallus.  Urethral meatus is patent.  No penile discharge. No penile lesions or rashes. Scrotum without lesions, cysts, rashes and/or edema.  Testicles are located scrotally and atrophic bilaterally. No masses are appreciated in the testicles. Left and right epididymis are normal. Rectal: Patient with  normal sphincter tone. Anus and perineum without scarring or rashes. No rectal masses are appreciated. Prostate is approximately 70 grams, no nodules are appreciated. Seminal vesicles are normal. Skin: No rashes, bruises or suspicious lesions. Lymph: No cervical or inguinal adenopathy. Neurologic: Grossly intact, no focal deficits, moving all 4 extremities. Psychiatric: Normal mood and affect.  Laboratory Data: Lab Results  Component Value Date   WBC 10.2 02/05/2015   HGB 12.4 (L) 02/05/2015   HCT 38.0 (L) 02/05/2015   MCV 89.2 02/05/2015   PLT 240 02/05/2015    Lab Results  Component Value Date   CREATININE 1.00 04/13/2016    Lab Results  Component Value Date   PSA 1.02 12/03/2011   PSA 0.73 12/10/2010   PSA 0.83 05/09/2008    Lab Results  Component Value Date   TESTOSTERONE 301.58 12/10/2011    Lab Results  Component Value Date   HGBA1C 6.0 04/13/2016    Lab Results  Component Value Date   TSH 1.178 02/05/2015       Component Value Date/Time   CHOL 117 04/13/2016 1011   HDL 43.90 04/13/2016 1011   CHOLHDL 3 04/13/2016 1011  VLDL 22.4 04/13/2016 1011   LDLCALC 51 04/13/2016 1011    Lab Results  Component Value Date   AST 13 04/13/2016   Lab Results  Component Value Date    ALT 10 04/13/2016   Pertinent imaging Results for NIKITAS, DAVTYAN (MRN [id number]) as of 07/06/2016 10:17  Ref. Range 07/06/2016 10:04  Scan Result Unknown 130     Assessment & Plan:    1. History of urinary rentention  - PVR is 130;  it is worsening  - continue the tamsulosin 0.4 mg daily  - RTC in one year for I PSS and PVR.    2. BPH with LUTS  - IPSS score is 13/2, it is improving  - Continue conservative management, avoiding bladder irritants and timed voiding's  - most bothersome symptoms is/are nocturia  - Continue tamsulosin 0.4 mg daily; refills given  - RTC in 12 months for IPSS, PVR and exam   3. Nocturia  - still not interested in pursuing a sleep study at this time  - RTC in 1 year for I PSS score, PVR and exam  Return in about 1 year (around 07/06/2017) for IPSS, PVR and exam.  These notes generated with voice recognition software. I apologize for typographical errors.  Zara Council, [facility] [address] [place], [place] [address] [phone]

## 2016-07-06 ENCOUNTER — Ambulatory Visit (INDEPENDENT_AMBULATORY_CARE_PROVIDER_SITE_OTHER): Payer: Medicare Other | Admitting: Urology

## 2016-07-06 ENCOUNTER — Encounter: Payer: Self-pay | Admitting: Urology

## 2016-07-06 VITALS — BP 136/63 | HR 62 | Ht 66.0 in | Wt 140.5 lb

## 2016-07-06 DIAGNOSIS — R351 Nocturia: Secondary | ICD-10-CM

## 2016-07-06 DIAGNOSIS — I255 Ischemic cardiomyopathy: Secondary | ICD-10-CM | POA: Diagnosis not present

## 2016-07-06 DIAGNOSIS — Z87898 Personal history of other specified conditions: Secondary | ICD-10-CM

## 2016-07-06 DIAGNOSIS — N401 Enlarged prostate with lower urinary tract symptoms: Secondary | ICD-10-CM | POA: Diagnosis not present

## 2016-07-06 DIAGNOSIS — N138 Other obstructive and reflux uropathy: Secondary | ICD-10-CM | POA: Diagnosis not present

## 2016-07-06 LAB — BLADDER SCAN AMB NON-IMAGING: Scan Result: 130

## 2016-07-06 MED ORDER — TAMSULOSIN HCL 0.4 MG PO CAPS: ORAL_CAPSULE | ORAL | 3 refills | Status: DC

## 2016-07-23 ENCOUNTER — Other Ambulatory Visit (HOSPITAL_COMMUNITY): Payer: Self-pay | Admitting: Cardiology

## 2016-08-27 ENCOUNTER — Other Ambulatory Visit (HOSPITAL_COMMUNITY): Payer: Self-pay | Admitting: Cardiology

## 2016-08-31 ENCOUNTER — Ambulatory Visit (HOSPITAL_COMMUNITY)
Admission: RE | Admit: 2016-08-31 | Discharge: 2016-08-31 | Disposition: A | Discharge status: Home / Self Care | Payer: Medicare Other | Source: Ambulatory Visit | Attending: Internal Medicine | Admitting: Internal Medicine

## 2016-08-31 ENCOUNTER — Encounter (HOSPITAL_COMMUNITY): Payer: Self-pay

## 2016-08-31 VITALS — BP 154/60 | HR 60 | Wt 142.1 lb

## 2016-08-31 DIAGNOSIS — Z87891 Personal history of nicotine dependence: Secondary | ICD-10-CM | POA: Insufficient documentation

## 2016-08-31 DIAGNOSIS — I6523 Occlusion and stenosis of bilateral carotid arteries: Secondary | ICD-10-CM

## 2016-08-31 DIAGNOSIS — Z79899 Other long term (current) drug therapy: Secondary | ICD-10-CM | POA: Diagnosis not present

## 2016-08-31 DIAGNOSIS — T82330S Leakage of aortic (bifurcation) graft (replacement), sequela: Secondary | ICD-10-CM

## 2016-08-31 DIAGNOSIS — I714 Abdominal aortic aneurysm, without rupture, unspecified: Secondary | ICD-10-CM

## 2016-08-31 DIAGNOSIS — Z7902 Long term (current) use of antithrombotics/antiplatelets: Secondary | ICD-10-CM | POA: Diagnosis not present

## 2016-08-31 DIAGNOSIS — I5022 Chronic systolic (congestive) heart failure: Secondary | ICD-10-CM | POA: Insufficient documentation

## 2016-08-31 DIAGNOSIS — Z7982 Long term (current) use of aspirin: Secondary | ICD-10-CM | POA: Insufficient documentation

## 2016-08-31 DIAGNOSIS — Z9889 Other specified postprocedural states: Secondary | ICD-10-CM

## 2016-08-31 DIAGNOSIS — IMO0001 Reserved for inherently not codable concepts without codable children: Secondary | ICD-10-CM

## 2016-08-31 DIAGNOSIS — E785 Hyperlipidemia, unspecified: Secondary | ICD-10-CM | POA: Insufficient documentation

## 2016-08-31 DIAGNOSIS — J449 Chronic obstructive pulmonary disease, unspecified: Secondary | ICD-10-CM | POA: Diagnosis not present

## 2016-08-31 DIAGNOSIS — N400 Enlarged prostate without lower urinary tract symptoms: Secondary | ICD-10-CM | POA: Insufficient documentation

## 2016-08-31 DIAGNOSIS — I110 Hypertensive heart disease with heart failure: Secondary | ICD-10-CM | POA: Insufficient documentation

## 2016-08-31 DIAGNOSIS — I255 Ischemic cardiomyopathy: Secondary | ICD-10-CM | POA: Insufficient documentation

## 2016-08-31 DIAGNOSIS — I2510 Atherosclerotic heart disease of native coronary artery without angina pectoris: Secondary | ICD-10-CM | POA: Insufficient documentation

## 2016-08-31 LAB — BASIC METABOLIC PANEL
Anion gap: 8 (ref 5–15)
BUN: 18 mg/dL (ref 6–20)
CALCIUM: 8.8 mg/dL — AB (ref 8.9–10.3)
CO2: 26 mmol/L (ref 22–32)
Chloride: 105 mmol/L (ref 101–111)
Creatinine, Ser: 0.94 mg/dL (ref 0.61–1.24)
GFR calc Af Amer: >60 mL/min (ref >60)
GLUCOSE: 103 mg/dL — AB (ref 65–99)
Potassium: 4.2 mmol/L (ref 3.5–5.1)
SODIUM: 139 mmol/L (ref 135–145)

## 2016-08-31 LAB — LIPID PANEL
CHOLESTEROL: 118 mg/dL (ref 0–200)
HDL: 43 mg/dL (ref >40)
LDL Cholesterol: 55 mg/dL (ref 0–99)
Total CHOL/HDL Ratio: 2.7 RATIO
Triglycerides: 100 mg/dL (ref <150)
VLDL: 20 mg/dL (ref 0–40)

## 2016-08-31 MED ORDER — LISINOPRIL 5 MG PO TABS: 2.5000 mg | ORAL_TABLET | Freq: Every day | ORAL | 3 refills | Status: DC

## 2016-08-31 NOTE — Progress Notes (Signed)
Patient ID: Erik [name], male   DOB: 11-Apr-1940, 76 y.o.   MRN: [id number] PCP: Dr [name] Vascular: Dr Scot Dock Cardiology: Dr Aundra Dubin   HPI: Erik [name] is a 76 year old with a history of COPD, HTN , PAD with bilateral CEAs, ICM, CAD s/p PCI of proximal LAD and proximal RCA (03/2876), chronic systolic HF and prior tobacco abuse. He follows at [facility] - Dr. Sammuel Hines. 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 Also had left renal artery stenting.   Admitted with dyspnea in 08/2013. Found to have 2v CAD and EF 20-25%, which was a new finding. He wore a lifevest. EF recovered in 05/2015 - EF 55-60%.   He returns today for follow up. Overall feeling well, denies SOB with walking into clinic, no SOB with working in the yard. Denies orthopnea, PND. Denies chest pain. He has not taken his medications today. BP at home - 109/40's in the am, higher at night 150/70. Eating a high salt diet, drinking more than 2L a day. Does not smoke cigarettes, denies ETOH.   Labs 03/2016, K 3.8, creatinine 1.00 Labs 09/13/13 K 4.1 Creatinine 0.95  Labs 10/15/13 K 4.1 Cr 0.7 => 1.07  ROS: All systems negative except as listed in HPI, PMH and Problem List.  Family Status  Relation Status  . Brother Alive  . Mother Alive       HTN, Born 79  . Father Deceased       brain tumor  . Brother Alive       HLD, Heart disease  . Brother Alive       HLD, CAD  . Brother Alive       HLD, MS  . Neg Hx (Not Specified)    Social History   Social History  . Marital status: Married    Spouse name: N/A  . Number of children: N/A  . Years of education: N/A   Occupational History  . Retired Development worker, community    Social History Main Topics  . Smoking status: Former Smoker    Packs/day: 1.50    Years: 0.50    Types: Cigarettes    Quit date: 08/27/2013  . Smokeless tobacco: Former Systems developer    Quit date: 09/10/2013  . Alcohol use No  . Drug use: No  . Sexual activity: Not on file   Other  Topics Concern  . Not on file   Social History Narrative   Lives with wife in [place]. Retired from the post office       Past Medical History:  Diagnosis Date  . BPH (benign prostatic hypertrophy)   . Carotid artery stenosis    a. Bilateral CEA  . Chronic systolic heart failure (HCC)    a. EF 20-25%, mild LVH, mod HK, mid apicalanteroseptal myocardium, mild Erik, LA mod dilated  . Collagen vascular disease ([place])   . COPD (chronic obstructive pulmonary disease) ([place])   . Coronary artery disease    a. LHC (08/2013): Lmain: short 30% distal, LAD: sml D1 & D2, 70% ostial D1, 95-99% LAD stenosis prox D2 LCx: sml/mod ramus subtot. occluded, 40% ostial set off lg OM1, 40% AV LCx after OM1, RCA: 90% prox (DES to RCA and prox LAD)  . Heart murmur   . History of colonic polyps   . Hyperlipidemia   . Hypertension   . Ischemic cardiomyopathy     Current Outpatient Prescriptions  Medication Sig Dispense Refill  . acetaminophen (TYLENOL)  325 MG tablet Take 325 mg by mouth.    Marland Kitchen aspirin 81 MG tablet Take 81 mg by mouth daily.      . bisoprolol (ZEBETA) 5 MG tablet TAKE 1 TABLET (5 MG TOTAL) BY MOUTH DAILY. 30 tablet 6  . clopidogrel (PLAVIX) 75 MG tablet Take 1 tablet (75 mg total) by mouth daily. 90 tablet 3  . ezetimibe-simvastatin (VYTORIN) 10-40 MG tablet TAKE 1 TABLET BY MOUTH AT BEDTIME. 90 tablet 3  . famotidine (PEPCID) 20 MG tablet TAKE 1 TABLET (20 MG TOTAL) BY MOUTH 2 (TWO) TIMES DAILY. 60 tablet 5  . furosemide (LASIX) 20 MG tablet TAKE 1 TABLET (20 MG TOTAL) BY MOUTH DAILY. 30 tablet 3  . hydrocortisone (ANUSOL-HC) 25 MG suppository Place 1 suppository (25 mg total) rectally daily. 6 suppository 0  . lisinopril (PRINIVIL,ZESTRIL) 5 MG tablet Take 0.5 tablets (2.5 mg total) by mouth daily. PT NEEDS APPT, CALL [phone] TO SCHEDULE, 1ST ATTEMPT 15 tablet 0  . nitroGLYCERIN (NITROSTAT) 0.4 MG SL tablet Place 1 tablet (0.4 mg total) under the tongue every 5 (five) minutes x 3  doses as needed for chest pain. 30 tablet 3  . omega-3 acid ethyl esters (LOVAZA) 1 g capsule Take by mouth. Reported on 06/05/2015    . oxyCODONE-acetaminophen (PERCOCET/ROXICET) 5-325 MG tablet Take 1 tablet by mouth every 6 (six) hours as needed for severe pain.    . tamsulosin (FLOMAX) 0.4 MG CAPS capsule TAKE 1 CAPSULE (0.4 MG TOTAL) BY MOUTH DAILY. 90 capsule 3   No current facility-administered medications for this encounter.     Vitals:   08/31/16 0831  BP: (!) 154/60  Pulse: 60  SpO2: 96%  Weight: 142 lb 2 oz (64.5 kg)     PHYSICAL EXAM: General: Elderly male, NAD. Walked into clinic without difficulty.  HEENT: Normal, atraumatic.  Neck: supple. JVP normal. Carotids 2+ bilaterally; no bruits. No lymphadenopathy or thryomegaly appreciated. R and L CEA scars.  Cor: PMI normal. Regular rate and rhythm. No rubs, gallops or murmurs. Lungs: Clear bilaterally. Normal effort.  Abdomen: Soft, non tender, non distended. No hepatosplenomegaly. No bruits or masses. Good bowel sounds. Extremities: no cyanosis, clubbing, rash. No edema. Some ecchymosis on left forearm notes.  Neuro: alert & orientedx3, cranial nerves grossly intact. Moves all 4 extremities w/o difficulty. Affect pleasant.   ASSESSMENT & PLAN:  1. Chronic Systolic Heart Failure: Ischemic cardiomyopathy, EF 20-25% (08/2013), EF recovered in 2017 EF 55-60%.  - NYHA II - Volume status stable.  - Continue lasix 20mg  daily, he will hold lasix if he develops dizziness or orthostasis. He eats a high salt diet and drinks well over 2L a day.  - His Entresto was stopped in 2015 for dizziness. EF now recovered.  - Continue lisinopril 5mg  daily. BMET today.  - Continue bisoprolol 5mg  daily. HR is 60.  - Follow up in 6 months with Dr. Aundra Dubin with an Echo.  2. CAD: [place] 09/11/13 - 2 vessel disease that required PCI of proximal LAD and proximal RCA. - He is chest pain free.  - Continue Plavix + ASA. Will be on Plavix indefinitely.   3. COPD: No longer smoking, normal spirometry in 2014.  5. H/O of bilateral CEAs:  - Continue ASA and Plavix.  6. History of  Endovascular aneurysm repair - Follows with Dr. Sula Rumple.  - Continue ASA and Plavix.  - Will have repeat CTA in 3 months.  7. Hyperlipidemia:  - Lipid panel today.  - Continue  Vytorin.    Follow up in 6 months with Dr. Aundra Dubin with an Echo.   Arbutus Leas 08/31/2016

## 2016-08-31 NOTE — Patient Instructions (Signed)
Routine lab work today. Will notify you of abnormal results, otherwise no news is good news!  No changes to medication at this time.  Follow up 6 months with Dr. McLean. We will call you closer to this time, or you may call our office to schedule 1 month before you are due to be seen. Take all medication as prescribed the day of your appointment. Bring all medications with you to your appointment.  Do the following things EVERYDAY: 1) Weigh yourself in the morning before breakfast. Write it down and keep it in a log. 2) Take your medicines as prescribed 3) Eat low salt foods-Limit salt (sodium) to 2000 mg per day.  4) Stay as active as you can everyday 5) Limit all fluids for the day to less than 2 liters  

## 2016-09-01 ENCOUNTER — Other Ambulatory Visit (HOSPITAL_COMMUNITY): Payer: Self-pay | Admitting: Cardiology

## 2016-09-02 ENCOUNTER — Other Ambulatory Visit: Payer: Self-pay | Admitting: Family Medicine

## 2016-09-05 ENCOUNTER — Other Ambulatory Visit: Payer: Self-pay | Admitting: Urology

## 2016-09-13 ENCOUNTER — Other Ambulatory Visit (HOSPITAL_COMMUNITY): Payer: Self-pay | Admitting: Cardiology

## 2016-09-13 MED ORDER — CLOPIDOGREL BISULFATE 75 MG PO TABS: 75.0000 mg | ORAL_TABLET | Freq: Every day | ORAL | 3 refills | Status: DC

## 2016-10-19 ENCOUNTER — Telehealth (HOSPITAL_COMMUNITY): Payer: Self-pay | Admitting: Pharmacist

## 2016-10-19 MED ORDER — METOPROLOL SUCCINATE ER 50 MG PO TB24: 50.0000 mg | ORAL_TABLET | Freq: Every day | ORAL | 5 refills | Status: DC

## 2016-10-19 NOTE — Telephone Encounter (Signed)
Received a fax from [facility] asking if Erik [name]'s bisoprolol could be switched to metoprolol 2/2 cost. Per discussion with Erik Booze, NP-C, will switch to metoprolol succinate 50 mg daily. New Rx sent. Also noticed that patient had 2 different Rx's for lisinopril. Called Erik [name] and left VM to call back to verify how he has been taking it.   Ruta Hinds. Velva Harman, PharmD, BCPS, CPP Clinical Pharmacist Pager: [phone] Phone: [phone] 10/19/2016 3:07 PM

## 2016-10-21 ENCOUNTER — Other Ambulatory Visit (HOSPITAL_COMMUNITY): Payer: Self-pay | Admitting: Adult Health

## 2016-11-05 ENCOUNTER — Other Ambulatory Visit (HOSPITAL_COMMUNITY): Payer: Self-pay | Admitting: Cardiology

## 2017-01-12 ENCOUNTER — Telehealth: Payer: Self-pay | Admitting: *Deleted

## 2017-01-12 NOTE — Telephone Encounter (Signed)
Copied from [place] [id number]. Topic: Inquiry >> Jan 12, 2017  2:34 PM Boyd Kerbs wrote: Reason for CRM: patient got a call from office yesterday and was returning call but no message was left and she didn't know who called or for what purpose. She stated it may have been for her husband too. I checked both charts and did not see a CRM or message. She said office could call her back at home # [phone]   Left message for  Erik [name] that the only thing I can figure is they were getting a phone call reminding them to get their flu shots.

## 2017-01-19 ENCOUNTER — Ambulatory Visit (HOSPITAL_COMMUNITY)
Admission: RE | Admit: 2017-01-19 | Discharge: 2017-01-19 | Disposition: A | Discharge status: Home / Self Care | Payer: Medicare Other | Source: Ambulatory Visit | Attending: Vascular Surgery | Admitting: Vascular Surgery

## 2017-01-19 ENCOUNTER — Encounter: Payer: Self-pay | Admitting: Family

## 2017-01-19 ENCOUNTER — Ambulatory Visit (INDEPENDENT_AMBULATORY_CARE_PROVIDER_SITE_OTHER): Payer: Medicare Other | Admitting: Family

## 2017-01-19 VITALS — BP 127/59 | HR 63 | Temp 97.5°F | Resp 18 | Ht 66.0 in | Wt 141.9 lb

## 2017-01-19 DIAGNOSIS — Z9889 Other specified postprocedural states: Secondary | ICD-10-CM | POA: Diagnosis not present

## 2017-01-19 DIAGNOSIS — I714 Abdominal aortic aneurysm, without rupture, unspecified: Secondary | ICD-10-CM

## 2017-01-19 DIAGNOSIS — I255 Ischemic cardiomyopathy: Secondary | ICD-10-CM | POA: Diagnosis not present

## 2017-01-19 DIAGNOSIS — I6523 Occlusion and stenosis of bilateral carotid arteries: Secondary | ICD-10-CM | POA: Diagnosis not present

## 2017-01-19 DIAGNOSIS — Z87891 Personal history of nicotine dependence: Secondary | ICD-10-CM

## 2017-01-19 LAB — VAS US CAROTID
LCCADDIAS: 14 cm/s
LCCAPSYS: 89 cm/s
LEFT ECA DIAS: -6 cm/s
LEFT VERTEBRAL DIAS: -7 cm/s
LICADSYS: -82 cm/s
LICAPSYS: -55 cm/s
Left CCA dist sys: 79 cm/s
Left CCA prox dias: 11 cm/s
Left ICA dist dias: -21 cm/s
Left ICA prox dias: -7 cm/s
RIGHT CCA MID DIAS: 12 cm/s
RIGHT VERTEBRAL DIAS: 6 cm/s
Right CCA prox dias: 8 cm/s
Right CCA prox sys: 67 cm/s
Right cca dist sys: -95 cm/s

## 2017-01-19 NOTE — Progress Notes (Signed)
Chief Complaint: Follow up Extracranial Carotid Artery Stenosis   History of Present Illness  Erik [name] is a 76 y.o. male who is s/p bilateral carotid endarterectomies in February and March of 2010 by Dr. Scot Dock.  He also has a AAA diagnosed October 2015 after prominent aortic pulse detected.  He is s/p 2-vessel FEVAR with a Z-Fen device on May 28, 2015 by Dr. 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 Pt states he was hospitalized re this for 3 days.  He returns today for routine surveillance of his extracranial carotid artery stenosis and follow up. The patient has no history of TIA or stroke symptoms. Specifically he denies any history of amaurosis fugax or monocular blindness, unilateral facial drooping, hemiplegia, or receptive or expressive aphasia.  He denies steal symptoms in either hand or arm.  He reports bilateral hip to lower leg heavy feeling after walking briskly up an incline about 1/2 mile, left more so than right, sx's do not occur walking on flat surface. He denies any known back problems.   Pt states Dr. Sammuel Hines at [facility] checked his AAA and circulation in his legs.   The patient denies abdominal or back pain, his brother has a AAA also.   He had cardiac stents placed July, 2015, states he may have had an MI.   Pt Diabetic: No Pt smoker: former smoker, quit June, 2015  Pt meds include:  Statin : Yes  ASA: Yes  Other anticoagulants/antiplatelets: Plavix prescribed by his cardiologist, Dr. Aundra Dubin, for CAD    Past Medical History:  Diagnosis Date  . BPH (benign prostatic hypertrophy)   . Carotid artery stenosis    a. Bilateral CEA  . Chronic systolic heart failure (HCC)    a. EF 20-25%, mild LVH, mod HK, mid apicalanteroseptal myocardium, mild MR, LA mod dilated  . Collagen vascular disease ([place])   . COPD (chronic obstructive pulmonary disease) ([place])   . Coronary artery disease    a.  LHC (08/2013): Lmain: short 30% distal, LAD: sml D1 & D2, 70% ostial D1, 95-99% LAD stenosis prox D2 LCx: sml/mod ramus subtot. occluded, 40% ostial set off lg OM1, 40% AV LCx after OM1, RCA: 90% prox (DES to RCA and prox LAD)  . Heart murmur   . History of colonic polyps   . Hyperlipidemia   . Hypertension   . Ischemic cardiomyopathy     Social History Social History   Tobacco Use  . Smoking status: Former Smoker    Packs/day: 1.50    Years: 0.50    Pack years: 0.75    Types: Cigarettes    Last attempt to quit: 08/27/2013    Years since quitting: 3.4  . Smokeless tobacco: Former Systems developer    Quit date: 09/10/2013  Substance Use Topics  . Alcohol use: No  . Drug use: No    Family History Family History  Problem Relation Age of Onset  . Heart disease Brother   . Hyperlipidemia Brother   . Hypertension Mother   . Cancer Father   . Hyperlipidemia Brother   . Hyperlipidemia Brother   . Heart attack Brother   . Hyperlipidemia Brother   . Multiple sclerosis Brother   . Kidney disease Neg Hx   . Prostate cancer Neg Hx     Surgical History Past Surgical History:  Procedure Laterality Date  . cardiac stents  09-2013  . CAROTID ENDARTERECTOMY  04/17/2008   right  . CAROTID  ENDARTERECTOMY  05/30/08   Left  . CHOLECYSTECTOMY     Gall Bladder  . HERNIA REPAIR    . lower aorta aneurysm  05/26/15    Allergies  Allergen Reactions  . Codeine Swelling    REACTION: Throat swells    Current Outpatient Medications  Medication Sig Dispense Refill  . acetaminophen (TYLENOL) 325 MG tablet Take 325 mg by mouth.    Marland Kitchen aspirin 81 MG tablet Take 81 mg by mouth daily.      . clopidogrel (PLAVIX) 75 MG tablet Take 1 tablet (75 mg total) by mouth daily. 90 tablet 3  . ezetimibe-simvastatin (VYTORIN) 10-40 MG tablet TAKE 1 TABLET BY MOUTH AT BEDTIME. 90 tablet 3  . famotidine (PEPCID) 20 MG tablet TAKE 1 TABLET (20 MG TOTAL) BY MOUTH 2 (TWO) TIMES DAILY. 60 tablet 5  . furosemide (LASIX)  20 MG tablet TAKE 1 TABLET (20 MG TOTAL) BY MOUTH DAILY. 30 tablet 3  . lisinopril (PRINIVIL,ZESTRIL) 5 MG tablet Take 1 tablet (5 mg total) by mouth daily. 90 tablet 6  . metoprolol succinate (TOPROL-XL) 50 MG 24 hr tablet Take 1 tablet (50 mg total) by mouth daily. Take with or immediately following a meal. 30 tablet 5  . nitroGLYCERIN (NITROSTAT) 0.4 MG SL tablet Place 1 tablet (0.4 mg total) under the tongue every 5 (five) minutes x 3 doses as needed for chest pain. 30 tablet 3  . omega-3 acid ethyl esters (LOVAZA) 1 g capsule Take by mouth. Reported on 06/05/2015    . oxyCODONE-acetaminophen (PERCOCET/ROXICET) 5-325 MG tablet Take 1 tablet by mouth every 6 (six) hours as needed for severe pain.    . tamsulosin (FLOMAX) 0.4 MG CAPS capsule TAKE 1 CAPSULE (0.4 MG TOTAL) BY MOUTH DAILY. 90 capsule 2  . hydrocortisone (ANUSOL-HC) 25 MG suppository Place 1 suppository (25 mg total) rectally daily. (Patient not taking: Reported on 01/19/2017) 6 suppository 0  . tamsulosin (FLOMAX) 0.4 MG CAPS capsule TAKE 1 CAPSULE (0.4 MG TOTAL) BY MOUTH DAILY. (Patient not taking: Reported on 01/19/2017) 90 capsule 3   No current facility-administered medications for this visit.     Review of Systems : See HPI for pertinent positives and negatives.  Physical Examination  Vitals:   01/19/17 1444 01/19/17 1445  BP: 123/64 (!) 127/59  Pulse: 63   Resp: 18   Temp: (!) 97.5 F (36.4 C)   TempSrc: Oral   SpO2: 96%   Weight: 141 lb 14.4 oz (64.4 kg)   Height: 5\' 6"  (1.676 m)    Body mass index is 22.9 kg/m.  General: WDWN male in NAD  GAIT:normal  Eyes: PERRLA  Pulmonary: Respirations are non-labored, CTAB, no rales, no rhonchi, or wheezing.  Cardiac: regular rhythm, no detected murmur  VASCULAR EXAM Carotid Bruits Right Left   negative negative   Abdominal aortic pulse is not palpable.  Radial pulses: right is 2+ palpable, left is 1+ palpable.   LE Pulses  Right  Left    FEMORAL  2+ palpable 2+ palpable   POPLITEAL  not palpable  not palpable  POSTERIOR TIBIAL  2+ palpable  2+ palpable   DORSALIS PEDIS ANTERIOR TIBIAL  not palpable  not palpable    Gastrointestinal: soft, nontender, BS WNL, no r/g, no palpable masses. Musculoskeletal: No muscle atrophy/wasting. M/S 5/5 throughout, Extremities without ischemic changes.  Neurologic: A&O X 3; appropriate affect, sensation is normal, speech is normal, CN 2-12 intact, Pain and light touch intact in extremities, Motor exam as  listed above.   Assessment: ANDRELL BERGESON is a 76 y.o. male who is s/p bilateral carotid endarterectomies in February and March of 2010.  He is also s/p 2-vessel FEVAR with a Z-Fen device on May 28, 2015 by Dr. 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 Dr. Sammuel Hines is monitoring pt re this. Pt has done well after this operation.   He has no history of stroke or TIA. He has no back or abdominal pain. His pedal pulses are palpable.   He reports bilateral radiculopathy like pain that radiates from both hips distally after walking briskly up an incline for 1/2 mile, no sx's when walking on a flat surface. His bilateral PT pedal pulses are 2+ palpable. He has no known back problems. This is not life limiting for him.  Pt states Dr. Sammuel Hines requested and he had performed ultrasounds of both of his legs about March of 2018; these results are not available, but CTA chest/abd/pelvis included femoral arteries showed moderate stenosis (results below).    DATA  Carotid Duplex (01/19/17): Patent bilateral endarterectomy sites with no evidence of hyperplasia or restenosis.  No significant stenosis of the bilateral ECA of CCA.  Bilateral vertebral arteries are antegrade (normal).  Bilateral subclavian arteries are multiphasic.  No significant change in comparison to the exams on 01/09/15 and 01-14-16.   CTA  chest/abd/pelvis at [facility] (03-17-16): VASCULAR FINDINGS:   Scattered atheromatous plaque and calcification throughout the aorta and its branch vessels, with irregular plaque worse at descending thoracic aorta. There is moderate narrowing of the left proximal subclavian artery measuring approximately 50%, similar to prior exam.Brachiocephalic, bilateral common carotid arteries, bilateral subclavian arteries, and visualized proximal bilateral vertebral arteries are patent.    AORTA: Status post aortobiiliac endograft with fenestrations at bilateral renal arteries. Excluded aneurysm sac measures 4.6 x 3.9 cm (6:192), previously measuring 5.2 x 4.8 cm. No evidence of endoleak.   MESENTERIC arteries:Celiac artery is severely narrowed origin and poststenotic dilatation, unchanged. SMA artery is patent. 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 Replaced left hepatic artery from left gastric artery.   RENAL arteries: Bilateral single renal artery stents are patent.   ILIAC arteries: Bilateral common iliac artery stents are patent. Bilateral external and internal iliac arteries are patent with mild scattered atheromatous plaque and calcification.    FEMORAL arteries: The visualized bilateral common femoral artery, SFA, and PFA are patent. Scattered atheromatous plaque and calcification of the common femoral artery, SFA, and PSA. There is moderate narrowing of the right common femoral artery, measuring approximately 50%. There is mild narrowing of the left common femoral artery, measuring approximately 25-50%.  Plan: Follow-up in 18 months with Carotid Duplex scan.     I discussed in depth with the patient the nature of atherosclerosis, and emphasized the importance of maximal medical management including strict control of blood pressure, blood glucose, and lipid levels, obtaining regular exercise, and continued cessation of smoking.  The patient is aware that without  maximal medical management the underlying atherosclerotic disease process will progress, limiting the benefit of any interventions. The patient was given information about stroke prevention and what symptoms should prompt the patient to seek immediate medical care. Thank you for allowing [place] to participate in this patient's care.  Clemon Chambers, RN, MSN, FNP-C [facility] Office: [phone]  Clinic Physician: Dicks[place]/Fields  01/19/17 2:47 PM

## 2017-01-19 NOTE — Patient Instructions (Signed)

## 2017-01-31 ENCOUNTER — Other Ambulatory Visit: Payer: Self-pay | Admitting: *Deleted

## 2017-01-31 MED ORDER — EZETIMIBE-SIMVASTATIN 10-40 MG PO TABS: 1.0000 | ORAL_TABLET | Freq: Every day | ORAL | 0 refills | Status: DC

## 2017-02-28 ENCOUNTER — Other Ambulatory Visit (HOSPITAL_COMMUNITY): Payer: Self-pay | Admitting: *Deleted

## 2017-02-28 MED ORDER — FUROSEMIDE 20 MG PO TABS: ORAL_TABLET | ORAL | 3 refills | Status: DC

## 2017-03-16 ENCOUNTER — Encounter: Payer: Self-pay | Admitting: Family Medicine

## 2017-03-16 ENCOUNTER — Ambulatory Visit: Payer: Self-pay | Admitting: *Deleted

## 2017-03-16 ENCOUNTER — Ambulatory Visit (INDEPENDENT_AMBULATORY_CARE_PROVIDER_SITE_OTHER): Payer: Medicare Other | Admitting: Family Medicine

## 2017-03-16 ENCOUNTER — Ambulatory Visit (INDEPENDENT_AMBULATORY_CARE_PROVIDER_SITE_OTHER)
Admission: RE | Admit: 2017-03-16 | Discharge: 2017-03-16 | Disposition: A | Discharge status: Home / Self Care | Payer: Medicare Other | Source: Ambulatory Visit | Attending: Family Medicine | Admitting: Family Medicine

## 2017-03-16 VITALS — BP 128/64 | HR 90 | Temp 98.7°F | Ht 66.0 in | Wt 141.2 lb

## 2017-03-16 DIAGNOSIS — R05 Cough: Secondary | ICD-10-CM

## 2017-03-16 DIAGNOSIS — R059 Cough, unspecified: Secondary | ICD-10-CM

## 2017-03-16 DIAGNOSIS — J40 Bronchitis, not specified as acute or chronic: Secondary | ICD-10-CM

## 2017-03-16 LAB — POC INFLUENZA A&B (BINAX/QUICKVUE)
INFLUENZA A, POC: NEGATIVE
INFLUENZA B, POC: NEGATIVE

## 2017-03-16 MED ORDER — AZITHROMYCIN 250 MG PO TABS
ORAL_TABLET | ORAL | 0 refills | Status: DC
Start: 2017-03-16 — End: 2017-05-09

## 2017-03-16 MED ORDER — BENZONATATE 200 MG PO CAPS: 200.0000 mg | ORAL_CAPSULE | Freq: Three times a day (TID) | ORAL | 0 refills | Status: DC | PRN

## 2017-03-16 NOTE — Telephone Encounter (Signed)
Pt sched with Dr Glori Bickers 1/2 @ 1100

## 2017-03-16 NOTE — Telephone Encounter (Signed)
Wife is calling requesting an appointment- husband is ill with chills and body aches. He has been coughing with chest pain. He refuses to go to ED and wants to be seen by PCP- he does not want to sit in ED for a long time. Skype to office and they will see him. Reason for Disposition . Chest pain  (Exception: MILD central chest pain, present only when coughing)  Answer Assessment - Initial Assessment Questions 1. WORST SYMPTOM: "What is your worst symptom?" (e.g., cough, runny nose, muscle aches, headache, sore throat, fever)      Chest-cough 2. ONSET: "When did your flu symptoms start?"      4 days ago 3. COUGH: "How bad is the cough?"       Causing chest pain 4. RESPIRATORY DISTRESS: "Describe your breathing."      wheezing 5. FEVER: "Do you have a fever?" If so, ask: "What is your temperature, how was it measured, and when did it start?"     Hot and chills- has not taken 6. EXPOSURE: "Were you exposed to someone with influenza?"       Grandson and family members were sick 56. FLU VACCINE: "Did you get a flu shot this year?"     no 8. HIGH RISK DISEASE: "Do you any chronic medical problems?" (e.g., heart or lung disease, asthma, weak immune system, or other HIGH RISK conditions)     Heart stints 9. PREGNANCY: "Is there any chance you are pregnant?" "When was your last menstrual period?"     n/a 10. OTHER SYMPTOMS: "Do you have any other symptoms?"  (e.g., runny nose, muscle aches, headache, sore throat)       Muscle aches, headache, chills, congestion, chest pain  Protocols used: INFLUENZA - SEASONAL-A-AH

## 2017-03-16 NOTE — Progress Notes (Signed)
Subjective:    Patient ID: Erik [name], male    DOB: 03-09-1941, 77 y.o.   MRN: [id number]  HPI 77 yo pt of Dr [name] is here for flu like symptoms  Past hx of vascular dz and CHF and copd and prediabetes   Started getting sick on sat  Dry cough- worse and worse  Sore and achey all over / chills - could not find thermometer  Non productive cough since   Nose is stuffy  Throat feels sore from coughing  Some sob only from cough-no wheeze   Takes tylenol over the counter  Took some ny quil once=and it did help his cough   Rapid flu test Results for orders placed or performed in visit on 03/16/17  POC Influenza A&B(BINAX/QUICKVUE)  Result Value Ref Range   Influenza A, POC Negative Negative   Influenza B, POC Negative Negative      Temp: 98.7 F (37.1 C)  BP: 128/64  Pulse Rate: 90  Pulse ox is 94% on RA  Did not have a flu vaccine this year - declines them   Patient Active Problem List   Diagnosis Date Noted  . Bronchitis 03/16/2017  . Hematochezia 02/27/2016  . Nocturia 07/13/2015  . Abdominal aortic aneurysm (AAA) ([place]) 07/07/2015  . Arteriosclerosis of coronary artery 07/07/2015  . Chronic obstructive pulmonary disease ([place]) 07/07/2015  . Cardiomyopathy, ischemic 07/07/2015  . 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  . Encounter for Foley catheter removal 06/03/2015  . Solitary pulmonary nodule 04/16/2014  . Counseling regarding end of life decision making 04/16/2014  . AAA (abdominal aortic aneurysm) without rupture ([place]) 12/18/2013  . HTN (hypertension) 10/03/2013  . CAD S/P percutaneous coronary angioplasty 09/19/2013  . Chronic systolic heart failure ([place]) 09/19/2013  . NSTEMI (non-ST elevated myocardial infarction) ([place]) 09/10/2013  . Family history of coronary artery disease in brother 09/10/2013  . ED (erectile dysfunction) 12/10/2011  . COPD, mild ([place]) 06/20/2008  . COLONIC POLYPS, ADENOMATOUS 06/05/2008  . BPH (benign  prostatic hyperplasia) 06/05/2008  . Prediabetes 05/14/2008  . VENEREAL WART 05/07/2008  . ONYCHOMYCOSIS, TOENAILS 05/07/2008  . Hyperlipidemia 05/07/2008  . Bilateral carotid artery stenosis 05/07/2008  . INTERMITTENT CLAUDICATION 05/07/2008  . HEART MURMUR, HX OF 05/07/2008  . COLONIC POLYPS, HX OF 05/07/2008   Past Medical History:  Diagnosis Date  . BPH (benign prostatic hypertrophy)   . Carotid artery stenosis    a. Bilateral CEA  . Chronic systolic heart failure (HCC)    a. EF 20-25%, mild LVH, mod HK, mid apicalanteroseptal myocardium, mild MR, LA mod dilated  . Collagen vascular disease ([place])   . COPD (chronic obstructive pulmonary disease) ([place])   . Coronary artery disease    a. LHC (08/2013): Lmain: short 30% distal, LAD: sml D1 & D2, 70% ostial D1, 95-99% LAD stenosis prox D2 LCx: sml/mod ramus subtot. occluded, 40% ostial set off lg OM1, 40% AV LCx after OM1, RCA: 90% prox (DES to RCA and prox LAD)  . Heart murmur   . History of colonic polyps   . Hyperlipidemia   . Hypertension   . Ischemic cardiomyopathy    Past Surgical History:  Procedure Laterality Date  . cardiac stents  09-2013  . CAROTID ENDARTERECTOMY  04/17/2008   right  . CAROTID ENDARTERECTOMY  05/30/08   Left  . CHOLECYSTECTOMY     Gall Bladder  . CORONARY ANGIOGRAM  09/11/2013   Procedure: CORONARY ANGIOGRAM;  Surgeon: Troy Sine, MD;  Location:  [facility] CATH LAB;  Service: Cardiovascular;;  . HERNIA REPAIR    . lower aorta aneurysm  05/26/15  . PERCUTANEOUS STENT INTERVENTION  09/11/2013   Procedure: PERCUTANEOUS STENT INTERVENTION;  Surgeon: Troy Sine, MD;  Location: [facility] CATH LAB;  Service: Cardiovascular;;  DES Prox RCA 3.5x15 xience    Social History   Tobacco Use  . Smoking status: Former Smoker    Packs/day: 1.50    Years: 0.50    Pack years: 0.75    Types: Cigarettes    Last attempt to quit: 08/27/2013    Years since quitting: 3.5  . Smokeless tobacco: Former Systems developer    Quit date:  09/10/2013  Substance Use Topics  . Alcohol use: No  . Drug use: No   Family History  Problem Relation Age of Onset  . Heart disease Brother   . Hyperlipidemia Brother   . Hypertension Mother   . Cancer Father   . Hyperlipidemia Brother   . Hyperlipidemia Brother   . Heart attack Brother   . Hyperlipidemia Brother   . Multiple sclerosis Brother   . Kidney disease Neg Hx   . Prostate cancer Neg Hx    Allergies  Allergen Reactions  . Codeine Swelling    REACTION: Throat swells   Current Outpatient Medications on File Prior to Visit  Medication Sig Dispense Refill  . acetaminophen (TYLENOL) 325 MG tablet Take 325 mg by mouth.    Marland Kitchen aspirin 81 MG tablet Take 81 mg by mouth daily.      . clopidogrel (PLAVIX) 75 MG tablet Take 1 tablet (75 mg total) by mouth daily. 90 tablet 3  . ezetimibe-simvastatin (VYTORIN) 10-40 MG tablet Take 1 tablet at bedtime by mouth. 90 tablet 0  . famotidine (PEPCID) 20 MG tablet TAKE 1 TABLET (20 MG TOTAL) BY MOUTH 2 (TWO) TIMES DAILY. 60 tablet 5  . furosemide (LASIX) 20 MG tablet TAKE 1 TABLET (20 MG TOTAL) BY MOUTH DAILY. 90 tablet 3  . lisinopril (PRINIVIL,ZESTRIL) 5 MG tablet Take 1 tablet (5 mg total) by mouth daily. (Patient taking differently: Take 2.5 mg by mouth daily. ) 90 tablet 6  . metoprolol succinate (TOPROL-XL) 50 MG 24 hr tablet Take 1 tablet (50 mg total) by mouth daily. Take with or immediately following a meal. 30 tablet 5  . nitroGLYCERIN (NITROSTAT) 0.4 MG SL tablet Place 1 tablet (0.4 mg total) under the tongue every 5 (five) minutes x 3 doses as needed for chest pain. 30 tablet 3  . omega-3 acid ethyl esters (LOVAZA) 1 g capsule Take by mouth. Reported on 06/05/2015    . tamsulosin (FLOMAX) 0.4 MG CAPS capsule TAKE 1 CAPSULE (0.4 MG TOTAL) BY MOUTH DAILY. 90 capsule 3  . tamsulosin (FLOMAX) 0.4 MG CAPS capsule TAKE 1 CAPSULE (0.4 MG TOTAL) BY MOUTH DAILY. 90 capsule 2   No current facility-administered medications on file prior to  visit.      Review of Systems  Constitutional: Positive for appetite change, chills and fatigue. Negative for fever.  HENT: Positive for congestion, postnasal drip, rhinorrhea, sinus pressure, sneezing and sore throat. Negative for ear pain.   Eyes: Negative for pain and discharge.  Respiratory: Positive for cough. Negative for shortness of breath, wheezing and stridor.   Cardiovascular: Negative for chest pain.  Gastrointestinal: Negative for diarrhea, nausea and vomiting.  Genitourinary: Negative for frequency, hematuria and urgency.  Musculoskeletal: Negative for arthralgias and myalgias.  Skin: Negative for rash.  Neurological: Positive for headaches. Negative  for dizziness, weakness and light-headedness.  Psychiatric/Behavioral: Negative for confusion and dysphoric mood.       Objective:   Physical Exam  Constitutional: He appears well-developed and well-nourished. No distress.  Well appearing and not sob  HENT:  Head: Normocephalic and atraumatic.  Right Ear: External ear normal.  Left Ear: External ear normal.  Mouth/Throat: Oropharynx is clear and moist.  Nares are injected and congested  No sinus tenderness Clear rhinorrhea and post nasal drip   Eyes: Conjunctivae and EOM are normal. Pupils are equal, round, and reactive to light. Right eye exhibits no discharge. Left eye exhibits no discharge.  Neck: Normal range of motion. Neck supple.  Cardiovascular: Normal rate and normal heart sounds.  Pulmonary/Chest: Effort normal and breath sounds normal. No respiratory distress. He has no wheezes. He has no rales. He exhibits no tenderness.  Some crackles at R base louder than left  No wheeze Good air exch with slightly distant bs Few scattered rhonchi  Cough sounds wet and upper airway sounds noted   Lymphadenopathy:    He has no cervical adenopathy.  Neurological: He is alert.  Skin: Skin is warm and dry. No rash noted.  Psychiatric: He has a normal mood and affect.           Assessment & Plan:   Problem List Items Addressed This Visit      Respiratory   Bronchitis - Primary    With cough and chills  Neg rapid flu test  Some congestion heard louder at R base than L - cxr ordered to r/o infiltrate No reactive airway findings-will follow closely  Covered with azithromycin  Tessalon for cough nyquil for other symptoms if helpful  Tylenol is ok for aches-but do not mix with nyquil  Update if not starting to improve in a week or if worsening        Relevant Orders   DG Chest 2 View    Other Visit Diagnoses    Cough       Relevant Orders   POC Influenza A&B(BINAX/QUICKVUE) (Completed)   DG Chest 2 View

## 2017-03-16 NOTE — Assessment & Plan Note (Signed)
With cough and chills  Neg rapid flu test  Some congestion heard louder at R base than L - cxr ordered to r/o infiltrate No reactive airway findings-will follow closely  Covered with azithromycin  Tessalon for cough nyquil for other symptoms if helpful  Tylenol is ok for aches-but do not mix with nyquil  Update if not starting to improve in a week or if worsening

## 2017-03-16 NOTE — Telephone Encounter (Signed)
I will see him then

## 2017-03-16 NOTE — Patient Instructions (Signed)
Take the zithromax as directed for bronchitis  We will do a chest xray now to rule out pneumonia  Drink lots of fluids (water) ny quil is ok if it helps symptoms  Tessalon is good for cough- as directed  For aches/ fever/chills- continue tylenol - but do not take it with nyquil because it already has tylenol in it   Update if not starting to improve in a week or if worsening   Especially wheezing or shortness of breath please let us know

## 2017-03-31 ENCOUNTER — Other Ambulatory Visit (HOSPITAL_COMMUNITY): Payer: Self-pay | Admitting: *Deleted

## 2017-03-31 MED ORDER — METOPROLOL SUCCINATE ER 50 MG PO TB24: 50.0000 mg | ORAL_TABLET | Freq: Every day | ORAL | 5 refills | Status: DC

## 2017-05-06 ENCOUNTER — Telehealth: Payer: Self-pay | Admitting: Family Medicine

## 2017-05-06 DIAGNOSIS — R7303 Prediabetes: Secondary | ICD-10-CM

## 2017-05-06 DIAGNOSIS — E785 Hyperlipidemia, unspecified: Secondary | ICD-10-CM

## 2017-05-06 NOTE — Telephone Encounter (Signed)
-----   Message from Eustace Pen, LPN sent at 7/41/2878  3:24 PM EST ----- Regarding: Labs 2/22 Lab orders needed. Thank you.  Insurance:  Medicare - traditional

## 2017-05-09 ENCOUNTER — Ambulatory Visit (INDEPENDENT_AMBULATORY_CARE_PROVIDER_SITE_OTHER): Payer: Medicare Other

## 2017-05-09 VITALS — BP 138/60 | HR 69 | Temp 97.8°F | Ht 66.0 in | Wt 137.0 lb

## 2017-05-09 DIAGNOSIS — E785 Hyperlipidemia, unspecified: Secondary | ICD-10-CM | POA: Diagnosis not present

## 2017-05-09 DIAGNOSIS — Z0000 Encounter for general adult medical examination without abnormal findings: Secondary | ICD-10-CM

## 2017-05-09 DIAGNOSIS — R7303 Prediabetes: Secondary | ICD-10-CM

## 2017-05-09 LAB — HEMOGLOBIN A1C: Hgb A1c MFr Bld: 5.8 % (ref 4.6–6.5)

## 2017-05-09 LAB — COMPREHENSIVE METABOLIC PANEL
ALT: 8 U/L (ref 0–53)
AST: 13 U/L (ref 0–37)
Albumin: 3.9 g/dL (ref 3.5–5.2)
Alkaline Phosphatase: 58 U/L (ref 39–117)
BUN: 19 mg/dL (ref 6–23)
CALCIUM: 9.5 mg/dL (ref 8.4–10.5)
CHLORIDE: 103 meq/L (ref 96–112)
CO2: 30 meq/L (ref 19–32)
Creatinine, Ser: 0.98 mg/dL (ref 0.40–1.50)
GFR: 79.06 mL/min (ref >60.00)
Glucose, Bld: 103 mg/dL — ABNORMAL HIGH (ref 70–99)
POTASSIUM: 4.2 meq/L (ref 3.5–5.1)
Sodium: 138 mEq/L (ref 135–145)
Total Bilirubin: 0.3 mg/dL (ref 0.2–1.2)
Total Protein: 7.5 g/dL (ref 6.0–8.3)

## 2017-05-09 LAB — LIPID PANEL
CHOL/HDL RATIO: 3
Cholesterol: 110 mg/dL (ref 0–200)
HDL: 43.4 mg/dL (ref >39.00)
LDL CALC: 49 mg/dL (ref 0–99)
NonHDL: 66.28
Triglycerides: 85 mg/dL (ref 0.0–149.0)
VLDL: 17 mg/dL (ref 0.0–40.0)

## 2017-05-09 NOTE — Progress Notes (Signed)
Subjective:   Erik [name] is a 77 y.o. male who presents for Medicare Annual/Subsequent preventive examination.  Review of Systems:  N/A Cardiac Risk Factors include: advanced age (>76men, >81 women);male gender;dyslipidemia;hypertension     Objective:    Vitals: BP 138/60 (BP Location: Right Arm, Patient Position: Sitting, Cuff Size: Normal)   Pulse 69   Temp 97.8 F (36.6 C) (Oral)   Ht 5\' 6"  (1.676 m) Comment: no shoes  Wt 137 lb (62.1 kg)   SpO2 97%   BMI 22.11 kg/m   Body mass index is 22.11 kg/m.  Advanced Directives 05/09/2017 02/05/2015 01/09/2015 07/04/2014 12/18/2013 09/10/2013  Does Patient Have a Medical Advance Directive? No No No No No Patient does not have advance directive  Would patient like information on creating a medical advance directive? No - Patient declined No - patient declined information - Yes Higher education careers adviser given No - patient declined information -  Pre-existing out of facility DNR order (yellow form or pink MOST form) - - - - - No    Tobacco Social History   Tobacco Use  Smoking Status Former Smoker  . Packs/day: 1.50  . Years: 0.50  . Pack years: 0.75  . Types: Cigarettes  . Last attempt to quit: 08/27/2013  . Years since quitting: 3.7  Smokeless Tobacco Former Systems developer  . Quit date: 09/10/2013     Counseling given: No   Clinical Intake:  Pre-visit preparation completed: Yes  Pain : No/denies pain Pain Score: 0-No pain     Nutritional Status: BMI of 19-24  Normal Nutritional Risks: None Diabetes: No  How often do you need to have someone help you when you read instructions, pamphlets, or other written materials from your doctor or pharmacy?: 1 - Never  Interpreter Needed?: No  Information entered by :: LPinson, LPN  Past Medical History:  Diagnosis Date  . BPH (benign prostatic hypertrophy)   . Carotid artery stenosis    a. Bilateral CEA  . Cataract   . Chronic systolic heart failure (HCC)    a. EF 20-25%,  mild LVH, mod HK, mid apicalanteroseptal myocardium, mild MR, LA mod dilated  . Collagen vascular disease ([place])   . COPD (chronic obstructive pulmonary disease) ([place])   . Coronary artery disease    a. LHC (08/2013): Lmain: short 30% distal, LAD: sml D1 & D2, 70% ostial D1, 95-99% LAD stenosis prox D2 LCx: sml/mod ramus subtot. occluded, 40% ostial set off lg OM1, 40% AV LCx after OM1, RCA: 90% prox (DES to RCA and prox LAD)  . Heart murmur   . History of colonic polyps   . Hyperlipidemia   . Hypertension   . Ischemic cardiomyopathy    Past Surgical History:  Procedure Laterality Date  . cardiac stents  09-2013  . CAROTID ENDARTERECTOMY  04/17/2008   right  . CAROTID ENDARTERECTOMY  05/30/08   Left  . CHOLECYSTECTOMY     Gall Bladder  . CORONARY ANGIOGRAM  09/11/2013   Procedure: CORONARY ANGIOGRAM;  Surgeon: Troy Sine, MD;  Location: [facility] CATH LAB;  Service: Cardiovascular;;  . HERNIA REPAIR    . lower aorta aneurysm  05/26/15  . PERCUTANEOUS STENT INTERVENTION  09/11/2013   Procedure: PERCUTANEOUS STENT INTERVENTION;  Surgeon: Troy Sine, MD;  Location: [facility] CATH LAB;  Service: Cardiovascular;;  DES Prox RCA 3.5x15 xience    Family History  Problem Relation Age of Onset  . Heart disease Brother   . Hyperlipidemia Brother   .  Hypertension Mother   . Cancer Father   . Hyperlipidemia Brother   . Hyperlipidemia Brother   . Heart attack Brother   . Hyperlipidemia Brother   . Multiple sclerosis Brother   . Kidney disease Neg Hx   . Prostate cancer Neg Hx    Social History   Socioeconomic History  . Marital status: Married    Spouse name: None  . Number of children: None  . Years of education: None  . Highest education level: None  Social Needs  . Financial resource strain: None  . Food insecurity - worry: None  . Food insecurity - inability: None  . Transportation needs - medical: None  . Transportation needs - non-medical: None  Occupational History  .  Occupation: Retired Development worker, community  Tobacco Use  . Smoking status: Former Smoker    Packs/day: 1.50    Years: 0.50    Pack years: 0.75    Types: Cigarettes    Last attempt to quit: 08/27/2013    Years since quitting: 3.7  . Smokeless tobacco: Former Systems developer    Quit date: 09/10/2013  Substance and Sexual Activity  . Alcohol use: No  . Drug use: No  . Sexual activity: None  Other Topics Concern  . None  Social History Narrative   Lives with wife in [place]. Retired from the post office    Outpatient Encounter Medications as of 05/09/2017  Medication Sig  . acetaminophen (TYLENOL) 325 MG tablet Take 325 mg by mouth.  Marland Kitchen aspirin 81 MG tablet Take 81 mg by mouth daily.    . clopidogrel (PLAVIX) 75 MG tablet Take 1 tablet (75 mg total) by mouth daily.  Marland Kitchen ezetimibe-simvastatin (VYTORIN) 10-40 MG tablet Take 1 tablet at bedtime by mouth.  . famotidine (PEPCID) 20 MG tablet TAKE 1 TABLET (20 MG TOTAL) BY MOUTH 2 (TWO) TIMES DAILY.  . furosemide (LASIX) 20 MG tablet TAKE 1 TABLET (20 MG TOTAL) BY MOUTH DAILY.  Marland Kitchen lisinopril (PRINIVIL,ZESTRIL) 5 MG tablet Take 1 tablet (5 mg total) by mouth daily. (Patient taking differently: Take 2.5 mg by mouth daily. )  . metoprolol succinate (TOPROL-XL) 50 MG 24 hr tablet Take 1 tablet (50 mg total) by mouth daily. Take with or immediately following a meal.  . nitroGLYCERIN (NITROSTAT) 0.4 MG SL tablet Place 1 tablet (0.4 mg total) under the tongue every 5 (five) minutes x 3 doses as needed for chest pain.  Marland Kitchen omega-3 acid ethyl esters (LOVAZA) 1 g capsule Take by mouth. Reported on 06/05/2015  . tamsulosin (FLOMAX) 0.4 MG CAPS capsule TAKE 1 CAPSULE (0.4 MG TOTAL) BY MOUTH DAILY.  . [DISCONTINUED] azithromycin (ZITHROMAX Z-PAK) 250 MG tablet Take 2 pills by mouth today and then 1 pill daily for 4 days  . [DISCONTINUED] benzonatate (TESSALON) 200 MG capsule Take 1 capsule (200 mg total) by mouth 3 (three) times daily as needed for cough. Swallow whole, do not bite pill    . [DISCONTINUED] tamsulosin (FLOMAX) 0.4 MG CAPS capsule TAKE 1 CAPSULE (0.4 MG TOTAL) BY MOUTH DAILY.   No facility-administered encounter medications on file as of 05/09/2017.     Activities of Daily Living In your present state of health, do you have any difficulty performing the following activities: 05/09/2017  Hearing? Y  Vision? Y  Difficulty concentrating or making decisions? N  Walking or climbing stairs? N  Dressing or bathing? N  Doing errands, shopping? N  Preparing Food and eating ? N  Using the Toilet? N  In  the past six months, have you accidently leaked urine? N  Do you have problems with loss of bowel control? N  Managing your Medications? N  Managing your Finances? N  Housekeeping or managing your Housekeeping? N  Some recent data might be hidden    Patient Care Team: [name], MD as PCP - General   Assessment:   This is a routine wellness examination for [facility].   Hearing Screening   125Hz  250Hz  500Hz  1000Hz  2000Hz  3000Hz  4000Hz  6000Hz  8000Hz   Right ear:   40 40 0  0    Left ear:   40 40 40  0      Visual Acuity Screening   Right eye Left eye Both eyes  Without correction: 20/50-1 20/40 20/40-1  With correction:        Exercise Activities and Dietary recommendations Current Exercise Habits: The patient does not participate in regular exercise at present, Exercise limited by: None identified  Goals    . Follow up with Primary Care Provider     Starting 05/09/2017, I will continue to take medications as prescribed and to keep appointments with PCP as scheduled.        Fall Risk Fall Risk  05/09/2017 04/20/2016 04/18/2015 04/16/2014 02/27/2013  Falls in the past year? No No No No No   Depression Screen PHQ 2/9 Scores 05/09/2017 04/20/2016 04/18/2015 04/16/2014  PHQ - 2 Score 0 0 0 0  PHQ- 9 Score 0 - - -    Cognitive Function MMSE - Mini Mental State Exam 05/09/2017  Orientation to time 5  Orientation to Place 5  Registration 3  Attention/  Calculation 0  Recall 2  Recall-comments unable to recall 1 of 3 words  Language- name 2 objects 0  Language- repeat 1  Language- follow 3 step command 3  Language- read & follow direction 0  Write a sentence 0  Copy design 0  Total score 19     PLEASE NOTE: A Mini-Cog screen was completed. Maximum score is 20. A value of 0 denotes this part of [place] MMSE was not completed or the patient failed this part of the Mini-Cog screening.   Mini-Cog Screening Orientation to Time - Max 5 pts Orientation to Place - Max 5 pts Registration - Max 3 pts Recall - Max 3 pts Language Repeat - Max 1 pts Language Follow 3 Step Command - Max 3 pts     Immunization History  Administered Date(s) Administered  . Pneumococcal Conjugate-13 04/16/2014  . Pneumococcal Polysaccharide-23 06/05/2008  . Td 06/05/2008    Screening Tests Health Maintenance  Topic Date Due  . INFLUENZA VACCINE  01/09/2018 (Originally 10/13/2016)  . TETANUS/TDAP  06/06/2018  . COLONOSCOPY  07/10/2018  . PNA vac Low Risk Adult  Completed     Plan:     I have personally reviewed, addressed, and noted the following in the patient's chart:  A. Medical and social history B. Use of alcohol, tobacco or illicit drugs  C. Current medications and supplements D. Functional ability and status E.  Nutritional status F.  Physical activity G. Advance directives H. List of other physicians I.  Hospitalizations, surgeries, and ER visits in previous 12 months J.  [place] to include hearing, vision, cognitive, depression L. Referrals and appointments - none  In addition, I have reviewed and discussed with patient certain preventive protocols, quality metrics, and best practice recommendations. A written personalized care plan for preventive services as well as general preventive health  recommendations were provided to patient.  See attached scanned questionnaire for additional information.   Signed,   Lindell Noe, MHA, BS, LPN Health Coach

## 2017-05-09 NOTE — Patient Instructions (Signed)
Erik [name] , Thank you for taking time to come for your Medicare Wellness Visit. I appreciate your ongoing commitment to your health goals. Please review the following plan we discussed and let me know if I can assist you in the future.   These are the goals we discussed: Goals    . Follow up with Primary Care Provider     Starting 05/09/2017, I will continue to take medications as prescribed and to keep appointments with PCP as scheduled.        This is a list of the screening recommended for you and due dates:  Health Maintenance  Topic Date Due  . Flu Shot  01/09/2018*  . Tetanus Vaccine  06/06/2018  . Colon Cancer Screening  07/10/2018  . Pneumonia vaccines  Completed  *Topic was postponed. The date shown is not the original due date.   Preventive Care for Adults  A healthy lifestyle and preventive care can promote health and wellness. Preventive health guidelines for adults include the following key practices.  . A routine yearly physical is a good way to check with your health care provider about your health and preventive screening. It is a chance to share any concerns and updates on your health and to receive a thorough exam.  . Visit your dentist for a routine exam and preventive care every 6 months. Brush your teeth twice a day and floss once a day. Good oral hygiene prevents tooth decay and gum disease.  . The frequency of eye exams is based on your age, health, family medical history, use  of contact lenses, and other factors. Follow your health care provider's recommendations for frequency of eye exams.  . Eat a healthy diet. Foods like vegetables, fruits, whole grains, low-fat dairy products, and lean protein foods contain the nutrients you need without too many calories. Decrease your intake of foods high in solid fats, added sugars, and salt. Eat the right amount of calories for you. Get information about a proper diet from your health care provider, if necessary.  .  Regular physical exercise is one of the most important things you can do for your health. Most adults should get at least 150 minutes of moderate-intensity exercise (any activity that increases your heart rate and causes you to sweat) each week. In addition, most adults need muscle-strengthening exercises on 2 or more days a week.  Silver Sneakers may be a benefit available to you. To determine eligibility, you may visit the website: www.silversneakers.com or contact program at 5863214881 Mon-Fri between 8AM-8PM.   . Maintain a healthy weight. The body mass index (BMI) is a screening tool to identify possible weight problems. It provides an estimate of body fat based on height and weight. Your health care provider can find your BMI and can help you achieve or maintain a healthy weight.   For adults 20 years and older: ? A BMI below 18.5 is considered underweight. ? A BMI of 18.5 to 24.9 is normal. ? A BMI of 25 to 29.9 is considered overweight. ? A BMI of 30 and above is considered obese.   . Maintain normal blood lipids and cholesterol levels by exercising and minimizing your intake of saturated fat. Eat a balanced diet with plenty of fruit and vegetables. Blood tests for lipids and cholesterol should begin at age 5 and be repeated every 5 years. If your lipid or cholesterol levels are high, you are over 50, or you are at high risk for heart disease, you  may need your cholesterol levels checked more frequently. Ongoing high lipid and cholesterol levels should be treated with medicines if diet and exercise are not working.  . If you smoke, find out from your health care provider how to quit. If you do not use tobacco, please do not start.  . If you choose to drink alcohol, please do not consume more than 2 drinks per day. One drink is considered to be 12 ounces (355 mL) of beer, 5 ounces (148 mL) of wine, or 1.5 ounces (44 mL) of liquor.  . If you are 70-42 years old, ask your health care  provider if you should take aspirin to prevent strokes.  . Use sunscreen. Apply sunscreen liberally and repeatedly throughout the day. You should seek shade when your shadow is shorter than you. Protect yourself by wearing long sleeves, pants, a wide-brimmed hat, and sunglasses year round, whenever you are outdoors.  . Once a month, do a whole body skin exam, using a mirror to look at the skin on your back. Tell your health care provider of new moles, moles that have irregular borders, moles that are larger than a pencil eraser, or moles that have changed in shape or color.

## 2017-05-09 NOTE — Progress Notes (Signed)
I reviewed health advisor's note, was available for consultation, and agree with documentation and plan.  

## 2017-05-09 NOTE — Progress Notes (Signed)
PCP notes:   Health maintenance:  Flu vaccine - pt declined  Abnormal screenings:   Hearing - failed  Hearing Screening   125Hz  250Hz  500Hz  1000Hz  2000Hz  3000Hz  4000Hz  6000Hz  8000Hz   Right ear:   40 40 0  0    Left ear:   40 40 40  0     Mini-Cog score: 19/20 MMSE - Mini Mental State Exam 05/09/2017  Orientation to time 5  Orientation to Place 5  Registration 3  Attention/ Calculation 0  Recall 2  Recall-comments unable to recall 1 of 3 words  Language- name 2 objects 0  Language- repeat 1  Language- follow 3 step command 3  Language- read & follow direction 0  Write a sentence 0  Copy design 0  Total score 19     Patient concerns:   None  Nurse concerns:  None  Next PCP appt:   05/19/17 @ 1500

## 2017-05-19 ENCOUNTER — Encounter: Payer: Self-pay | Admitting: Family Medicine

## 2017-05-19 ENCOUNTER — Ambulatory Visit (INDEPENDENT_AMBULATORY_CARE_PROVIDER_SITE_OTHER): Payer: Medicare Other | Admitting: Family Medicine

## 2017-05-19 ENCOUNTER — Encounter: Payer: Self-pay | Admitting: *Deleted

## 2017-05-19 ENCOUNTER — Other Ambulatory Visit: Payer: Self-pay

## 2017-05-19 DIAGNOSIS — J449 Chronic obstructive pulmonary disease, unspecified: Secondary | ICD-10-CM | POA: Diagnosis not present

## 2017-05-19 DIAGNOSIS — R911 Solitary pulmonary nodule: Secondary | ICD-10-CM | POA: Diagnosis not present

## 2017-05-19 DIAGNOSIS — E785 Hyperlipidemia, unspecified: Secondary | ICD-10-CM

## 2017-05-19 DIAGNOSIS — I6523 Occlusion and stenosis of bilateral carotid arteries: Secondary | ICD-10-CM | POA: Diagnosis not present

## 2017-05-19 DIAGNOSIS — I10 Essential (primary) hypertension: Secondary | ICD-10-CM | POA: Diagnosis not present

## 2017-05-19 DIAGNOSIS — I714 Abdominal aortic aneurysm, without rupture, unspecified: Secondary | ICD-10-CM

## 2017-05-19 DIAGNOSIS — R7303 Prediabetes: Secondary | ICD-10-CM

## 2017-05-19 DIAGNOSIS — I5022 Chronic systolic (congestive) heart failure: Secondary | ICD-10-CM

## 2017-05-19 NOTE — Progress Notes (Signed)
Subjective:    Patient ID: Erik [name], male    DOB: Jan 13, 1941, 77 y.o.   MRN: [id number]  HPI  The patient presents for  complete physical and review of chronic health problems. He/She also has the following acute concerns today:  Health maintenance:  Flu vaccine - pt declined  Abnormal screenings:   Hearing - failed             Hearing Screening   125Hz  250Hz  500Hz  1000Hz  2000Hz  3000Hz  4000Hz  6000Hz  8000Hz   Right ear:   40 40 0  0    Left ear:   40 40 40  0     Mini-Cog score: 19/20 MMSE - Mini Mental State Exam 05/09/2017  Orientation to time 5  Orientation to Place 5  Registration 3  Attention/ Calculation 0  Recall 2  Recall-comments unable to recall 1 of 3 words  Language- name 2 objects 0  Language- repeat 1  Language- follow 3 step command 3  Language- read & follow direction 0  Write a sentence 0  Copy design 0  Total score 19    Today 05/19/17 Hypertension:   Good control on bisoprolol, lisinopril  Using medication without problems or lightheadedness:  none Chest pain with exertion: none Edema:none Short of breath: mild sob with exertion, stable 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 BP gets lower. Other issues: minimal water.. Drinking 5-6 cup of coffee per day with sugar.  Elevated Cholesterol:  LDL at goal on vytorin 10/40 mg daily. Lab Results  Component Value Date   CHOL 110 05/09/2017   HDL 43.40 05/09/2017   LDLCALC 49 05/09/2017   LDLDIRECT 176.9 05/09/2008   TRIG 85.0 05/09/2017   CHOLHDL 3 05/09/2017  Using medications without problems: none Muscle aches: none Diet compliance: healthy Exercise: still working in shop, physical active Other complaints: prediabetes  Lab Results  Component Value Date   HGBA1C 5.8 05/09/2017   B carotid stenosis, claudication:L followed by Vascular MD, Osborne Oman NP.Marland Kitchen Last OV 01/14/2016 AAA:  Followed at [facility] by Dr. Corky Sox, s/p  renal stent ( 2-vessel FEVAR with  a Z-Fen device onMarch 15,2017 by Dr. 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   CAD, CHF followed by cards in past.  Dr. Marigene Ehlers S/ P cath 2015.  ECHO 2017 EF 55-60%  COPD , mild: occ cough with recent cold symptoms. No SOB.Last spirometry 04/18/2015 nml on no meds.   BPH: stable control on flomax  Social History /Family History/Past Medical History reviewed in detail and updated in EMR if needed. Blood pressure 106/60, pulse 83, temperature 98.5 F (36.9 C), temperature source Oral, height 5\' 6"  (1.676 m), weight 141 lb (64 kg).  Review of Systems  Constitutional: Negative for fatigue and fever.  HENT: Negative for ear pain.   Eyes: Negative for pain.  Respiratory: Negative for cough and shortness of breath.   Cardiovascular: Negative for chest pain, palpitations and leg swelling.  Gastrointestinal: Negative for abdominal pain.  Genitourinary: Negative for dysuria.  Musculoskeletal: Negative for arthralgias.  Neurological: Negative for syncope, light-headedness and headaches.  Psychiatric/Behavioral: Negative for dysphoric mood.       Objective:   Physical Exam  Constitutional: Vital signs are normal. He appears well-developed and well-nourished.  HENT:  Head: Normocephalic.  Right Ear: Hearing normal.  Left Ear: Hearing normal.  Nose: Nose normal.  Mouth/Throat: Oropharynx is  clear and moist and mucous membranes are normal.  Neck: Trachea normal. Carotid bruit is not present. No thyroid mass and no thyromegaly present.  Cardiovascular: Normal rate, regular rhythm and normal pulses. Exam reveals no gallop, no distant heart sounds and no friction rub.  No murmur heard. No peripheral edema  Pulmonary/Chest: Effort normal and breath sounds normal. No respiratory distress.  Skin: Skin is warm, dry and intact. No rash noted.  Psychiatric: He has a normal mood and affect. His speech  is normal and behavior is normal. Thought content normal.          Assessment & Plan:  The patient's preventative maintenance and recommended screening tests for an annual wellness exam were reviewed in full today. Brought up to date unless services declined.  Counselled on the importance of diet, exercise, and its role in overall health and mortality. The patient's FH and SH was reviewed, including their home life, tobacco status, and drug and alcohol status.   Td, PNA: Up to date. Refused flu. Shingles:not interested  PSA: Hx of BPH on tamsulosin. PSA not indicated  Colon: 06/2008 Dr. Sharlett Iles, repeat 10 years hyperplastic polyps  Smoker: spirometry performed 2017 nml.. Breathing better since quit smoking. Pulmonar nodules: 04/2014 no change in nodules.  CT  chest 2017at [facility]: showed no change in  pulmonary nodules   no further eval needed.

## 2017-05-19 NOTE — Assessment & Plan Note (Signed)
euvolemic on BBlocker and ACEI.

## 2017-05-19 NOTE — Assessment & Plan Note (Signed)
Minimal symptoms. Not interested in spirometry repeat.

## 2017-05-19 NOTE — Assessment & Plan Note (Signed)
Followed by vascular 

## 2017-05-19 NOTE — Patient Instructions (Addendum)
Don't skip meals.  Make sure to drink fluids.  Quit smoking entirely.  Check blood pressure at home.. Call if running low < 90/60 frequently

## 2017-05-19 NOTE — Assessment & Plan Note (Signed)
Stable control. Stop sugar in coffee

## 2017-05-19 NOTE — Assessment & Plan Note (Signed)
Occ dropping low  B{P with skipping meals and not eating.  Encouraged fluids and not skipping meals.  Follow BP at home.

## 2017-05-19 NOTE — Assessment & Plan Note (Signed)
Well controlled. Continue current medication.  

## 2017-05-19 NOTE — Assessment & Plan Note (Signed)
No further eval needed. 

## 2017-07-06 ENCOUNTER — Ambulatory Visit: Payer: Medicare Other | Admitting: Urology

## 2017-07-20 ENCOUNTER — Ambulatory Visit: Payer: Medicare Other | Admitting: Urology

## 2017-08-02 ENCOUNTER — Other Ambulatory Visit: Payer: Self-pay | Admitting: Family Medicine

## 2017-08-20 NOTE — Progress Notes (Signed)
11:51 AM   Erik [name] [birth year] [id number]  Referring provider: [name], MD [address] [place], [place] [address]  Chief Complaint  Patient presents with  . Benign Prostatic Hypertrophy    HPI: Patient is a 77 year old Caucasian male with a history of urinary retention and BPH with LUTS who presents today for a yearly follow-up  History of urinary retention Patient had abdominal aortic aneurysm repair on 05/25/2015.  On post-operative day two,  he was unable to void after foley cath removal.  He then stated he tried to urinate on his own, twice and failed.  He was then discharged with a foley.  He has been on tamsulosin for the last several years.  He stated he had not had any difficulty with urination prior to surgery.  He felt the reason he failed his voiding trial was that they attempted to discharge him too early.  He feels he is voiding well and emptying his bladder.  His PVR today is 71 mL.    BPH WITH LUTS His IPSS score today is 20, which is severe lower urinary tract symptomatology. He is mostly satisfied with his quality life due to his urinary symptoms.  His PVR is 71 mL.  His previous I PSS score 13/2.  His previous PVR was 130 mL.  His major complaints at this time are frequency, nocturia, intermittency, hesitancy and a weak urinary stream.  He denies any dysuria, hematuria or suprapubic pain.   He currently taking tamsulosin 0.4 mg daily.   He also denies any recent fevers, chills, nausea or vomiting.  He does not have a family history of PCa. IPSS    Row Name 08/22/17 1100         International Prostate Symptom Score   How often have you had the sensation of not emptying your bladder?  About half the time     How often have you had to urinate less than every two hours?  Almost always     How often have you found you stopped and started again several times when you urinated?  Almost always     How often have you found it difficult to postpone  urination?  Not at All     How often have you had a weak urinary stream?  Almost always     How often have you had to strain to start urination?  Not at All     How many times did you typically get up at night to urinate?  2 Times     Total IPSS Score  20       Quality of Life due to urinary symptoms   If you were to spend the rest of your life with your urinary condition just the way it is now how would you feel about that?  Mostly Satisfied        Score:  1-7 Mild 8-19 Moderate 20-35 Severe    PMH: Past Medical History:  Diagnosis Date  . BPH (benign prostatic hypertrophy)   . Carotid artery stenosis    a. Bilateral CEA  . Cataract   . Chronic systolic heart failure (HCC)    a. EF 20-25%, mild LVH, mod HK, mid apicalanteroseptal myocardium, mild MR, LA mod dilated  . Collagen vascular disease ([place])   . COPD (chronic obstructive pulmonary disease) ([place])   . Coronary artery disease    a. LHC (08/2013): Lmain: short 30% distal, LAD: sml D1 & D2,  70% ostial D1, 95-99% LAD stenosis prox D2 LCx: sml/mod ramus subtot. occluded, 40% ostial set off lg OM1, 40% AV LCx after OM1, RCA: 90% prox (DES to RCA and prox LAD)  . Heart murmur   . History of colonic polyps   . Hyperlipidemia   . Hypertension   . Ischemic cardiomyopathy     Surgical History: Past Surgical History:  Procedure Laterality Date  . cardiac stents  09-2013  . CAROTID ENDARTERECTOMY  04/17/2008   right  . CAROTID ENDARTERECTOMY  05/30/08   Left  . CHOLECYSTECTOMY     Gall Bladder  . CORONARY ANGIOGRAM  09/11/2013   Procedure: CORONARY ANGIOGRAM;  Surgeon: Troy Sine, MD;  Location: [facility] C[facility] LAB;  Service: Cardiovascular;;  . HERNIA REPAIR    . lower aorta aneurysm  05/26/15  . PERCUTANEOUS STENT INTERVENTION  09/11/2013   Procedure: PERCUTANEOUS STENT INTERVENTION;  Surgeon: Troy Sine, MD;  Location: [facility] CATH LAB;  Service: Cardiovascular;;  DES Prox RCA 3.5x15 xience     Home Medications:    Allergies as of 08/22/2017      Reactions   Codeine Swelling   REACTION: Throat swells      Medication List        Accurate as of 08/22/17 11:51 AM. Always use your most recent med list.          acetaminophen 325 MG tablet Commonly known as:  TYLENOL Take 325 mg by mouth.   aspirin 81 MG tablet Take 81 mg by mouth daily.   clopidogrel 75 MG tablet Commonly known as:  PLAVIX Take 1 tablet (75 mg total) by mouth daily.   ezetimibe-simvastatin 10-40 MG tablet Commonly known as:  VYTORIN TAKE 1 TABLET AT BEDTIME BY MOUTH.   famotidine 20 MG tablet Commonly known as:  PEPCID TAKE 1 TABLET (20 MG TOTAL) BY MOUTH 2 (TWO) TIMES DAILY.   furosemide 20 MG tablet Commonly known as:  LASIX TAKE 1 TABLET (20 MG TOTAL) BY MOUTH DAILY.   lisinopril 5 MG tablet Commonly known as:  PRINIVIL,ZESTRIL Take 1 tablet (5 mg total) by mouth daily.   metoprolol succinate 50 MG 24 hr tablet Commonly known as:  TOPROL-XL Take 1 tablet (50 mg total) by mouth daily. Take with or immediately following a meal.   nitroGLYCERIN 0.4 MG SL tablet Commonly known as:  NITROSTAT Place 1 tablet (0.4 mg total) under the tongue every 5 (five) minutes x 3 doses as needed for chest pain.   omega-3 acid ethyl esters 1 g capsule Commonly known as:  LOVAZA Take by mouth. Reported on 06/05/2015   tamsulosin 0.4 MG Caps capsule Commonly known as:  FLOMAX TAKE 1 CAPSULE (0.4 MG TOTAL) BY MOUTH DAILY.       Allergies:  Allergies  Allergen Reactions  . Codeine Swelling    REACTION: Throat swells    Family History: Family History  Problem Relation Age of Onset  . Heart disease Brother   . Hyperlipidemia Brother   . Hypertension Mother   . Cancer Father   . Hyperlipidemia Brother   . Hyperlipidemia Brother   . Heart attack Brother   . Hyperlipidemia Brother   . Multiple sclerosis Brother   . Kidney disease Neg Hx   . Prostate cancer Neg Hx     Social History:  reports that he quit  smoking about 3 years ago. His smoking use included cigarettes. He has a 0.75 pack-year smoking history. He quit smokeless tobacco use about  3 years ago. He reports that he does not drink alcohol or use drugs.  ROS: UROLOGY Frequent Urination?: Yes Hard to postpone urination?: No Burning/pain with urination?: No Get up at night to urinate?: Yes Leakage of urine?: No Urine stream starts and stops?: Yes Trouble starting stream?: Yes Do you have to strain to urinate?: No Blood in urine?: No Urinary tract infection?: No Sexually transmitted disease?: No Injury to kidneys or bladder?: No Painful intercourse?: No Weak stream?: Yes Erection problems?: Yes Penile pain?: No  Gastrointestinal Nausea?: No Vomiting?: No Indigestion/heartburn?: No Diarrhea?: No Constipation?: No  Constitutional Fever: No Night sweats?: No Weight loss?: No Fatigue?: No  Skin Skin rash/lesions?: No Itching?: No  Eyes Blurred vision?: No Double vision?: No  Ears/Nose/Throat Sore throat?: No Sinus problems?: No  Hematologic/Lymphatic Swollen glands?: No Easy bruising?: No  Cardiovascular Leg swelling?: No Chest pain?: No  Respiratory Cough?: No Shortness of breath?: No  Endocrine Excessive thirst?: No  Musculoskeletal Back pain?: No Joint pain?: No  Neurological Headaches?: No Dizziness?: No  Psychologic Depression?: No Anxiety?: No  Physical Exam: BP 114/69 (BP Location: Right Arm, Patient Position: Sitting, Cuff Size: Normal)   Pulse 76   Ht 5\' 6"  (1.676 m)   Wt 134 lb 14.4 oz (61.2 kg)   BMI 21.77 kg/m   Constitutional: Well nourished. Alert and oriented, No acute distress. HEENT: [place] AT, moist mucus membranes. Trachea midline, no masses. Cardiovascular: No clubbing, cyanosis, or edema. Respiratory: Normal respiratory effort, no increased work of breathing. GI: Abdomen is soft, non tender, non distended, no abdominal masses. Liver and spleen not palpable.  No  hernias appreciated.  Stool sample for occult testing is not indicated.   GU: No CVA tenderness.  No bladder fullness or masses.  Patient with circumcised phallus.   Urethral meatus is patent.  No penile discharge. No penile lesions or rashes. Scrotum without lesions, cysts, rashes and/or edema.  Testicles are located scrotally bilaterally. No masses are appreciated in the testicles. Left and right epididymis are normal. Rectal: Patient with  normal sphincter tone. Anus and perineum without scarring or rashes. No rectal masses are appreciated. Prostate is approximately 70 grams, no nodules are appreciated. Seminal vesicles are normal. Skin: No rashes, bruises or suspicious lesions. Lymph: No cervical or inguinal adenopathy. Neurologic: Grossly intact, no focal deficits, moving all 4 extremities. Psychiatric: Normal mood and affect.   Laboratory Data: Lab Results  Component Value Date   WBC 10.2 02/05/2015   HGB 12.4 (L) 02/05/2015   HCT 38.0 (L) 02/05/2015   MCV 89.2 02/05/2015   PLT 240 02/05/2015    Lab Results  Component Value Date   CREATININE 0.98 05/09/2017    Lab Results  Component Value Date   PSA 1.02 12/03/2011   PSA 0.73 12/10/2010   PSA 0.83 05/09/2008    Lab Results  Component Value Date   TESTOSTERONE 301.58 12/10/2011    Lab Results  Component Value Date   HGBA1C 5.8 05/09/2017    Lab Results  Component Value Date   TSH 1.178 02/05/2015       Component Value Date/Time   CHOL 110 05/09/2017 0927   HDL 43.40 05/09/2017 0927   CHOLHDL 3 05/09/2017 0927   VLDL 17.0 05/09/2017 0927   LDLCALC 49 05/09/2017 0927    Lab Results  Component Value Date   AST 13 05/09/2017   Lab Results  Component Value Date   ALT 8 05/09/2017   I have reviewed the labs.  Pertinent  imaging Results for STEEL, KERNEY (MRN [id number]) as of 08/22/2017 11:45  Ref. Range 08/22/2017 11:29  Scan Result Unknown 5mL    Assessment & Plan:    1. History of urinary  rentention PVR is 73 mL   Continue tamsulosin 0.4 mg daily Return to clinic in 1 year for IPSS and PVR Patient instructed to contact the office or seek treatment in the emergency room if he should develop suprapubic pain and/or the inability to urinate  2. BPH with LUTS IPSS score is 20/2, it is worsening Continue conservative management, avoiding bladder irritants and timed voidings Continue tamsulosin 0.4 mg daily Return to clinic in 12 months for IPSS, PVR and exam  Return in about 1 year (around 08/23/2018) for IPSS and PVR.  These notes generated with voice recognition software. I apologize for typographical errors.  Zara Council, PA-C  [facility] [address] [place], [place] [address] [phone]

## 2017-08-22 ENCOUNTER — Encounter: Payer: Self-pay | Admitting: Urology

## 2017-08-22 ENCOUNTER — Ambulatory Visit (INDEPENDENT_AMBULATORY_CARE_PROVIDER_SITE_OTHER): Payer: Medicare Other | Admitting: Urology

## 2017-08-22 VITALS — BP 114/69 | HR 76 | Ht 66.0 in | Wt 134.9 lb

## 2017-08-22 DIAGNOSIS — N138 Other obstructive and reflux uropathy: Secondary | ICD-10-CM | POA: Diagnosis not present

## 2017-08-22 DIAGNOSIS — Z87898 Personal history of other specified conditions: Secondary | ICD-10-CM | POA: Diagnosis not present

## 2017-08-22 DIAGNOSIS — I6523 Occlusion and stenosis of bilateral carotid arteries: Secondary | ICD-10-CM | POA: Diagnosis not present

## 2017-08-22 DIAGNOSIS — N401 Enlarged prostate with lower urinary tract symptoms: Secondary | ICD-10-CM

## 2017-08-22 LAB — BLADDER SCAN AMB NON-IMAGING

## 2017-09-04 ENCOUNTER — Other Ambulatory Visit (HOSPITAL_COMMUNITY): Payer: Self-pay | Admitting: Cardiology

## 2017-09-07 ENCOUNTER — Other Ambulatory Visit: Payer: Self-pay | Admitting: Family Medicine

## 2017-09-07 MED ORDER — TAMSULOSIN HCL 0.4 MG PO CAPS: ORAL_CAPSULE | ORAL | 3 refills | Status: DC

## 2017-10-16 ENCOUNTER — Other Ambulatory Visit (HOSPITAL_COMMUNITY): Payer: Self-pay | Admitting: Cardiology

## 2017-11-22 ENCOUNTER — Other Ambulatory Visit (HOSPITAL_COMMUNITY): Payer: Self-pay | Admitting: Cardiology

## 2017-12-01 ENCOUNTER — Other Ambulatory Visit (HOSPITAL_COMMUNITY): Payer: Self-pay | Admitting: Cardiology

## 2018-01-10 ENCOUNTER — Other Ambulatory Visit (HOSPITAL_COMMUNITY): Payer: Self-pay | Admitting: Cardiology

## 2018-01-25 ENCOUNTER — Other Ambulatory Visit (HOSPITAL_COMMUNITY): Payer: Self-pay | Admitting: Cardiology

## 2018-01-31 ENCOUNTER — Other Ambulatory Visit (HOSPITAL_COMMUNITY): Payer: Self-pay

## 2018-01-31 MED ORDER — CLOPIDOGREL BISULFATE 75 MG PO TABS: 75.0000 mg | ORAL_TABLET | Freq: Every day | ORAL | 1 refills | Status: DC

## 2018-02-19 ENCOUNTER — Other Ambulatory Visit (HOSPITAL_COMMUNITY): Payer: Self-pay | Admitting: Cardiology

## 2018-02-23 ENCOUNTER — Other Ambulatory Visit (HOSPITAL_COMMUNITY): Payer: Self-pay | Admitting: Cardiology

## 2018-03-21 ENCOUNTER — Other Ambulatory Visit (HOSPITAL_COMMUNITY): Payer: Self-pay | Admitting: Cardiology

## 2018-04-04 ENCOUNTER — Other Ambulatory Visit (HOSPITAL_COMMUNITY): Payer: Self-pay | Admitting: Cardiology

## 2018-04-14 ENCOUNTER — Encounter (HOSPITAL_COMMUNITY): Payer: Self-pay | Admitting: Cardiology

## 2018-04-14 ENCOUNTER — Ambulatory Visit (HOSPITAL_COMMUNITY)
Admission: RE | Admit: 2018-04-14 | Discharge: 2018-04-14 | Disposition: A | Discharge status: Home / Self Care | Payer: Medicare Other | Source: Ambulatory Visit | Attending: Cardiology | Admitting: Cardiology

## 2018-04-14 VITALS — BP 144/60 | HR 83 | Wt 143.2 lb

## 2018-04-14 DIAGNOSIS — J449 Chronic obstructive pulmonary disease, unspecified: Secondary | ICD-10-CM | POA: Diagnosis not present

## 2018-04-14 DIAGNOSIS — I447 Left bundle-branch block, unspecified: Secondary | ICD-10-CM | POA: Diagnosis not present

## 2018-04-14 DIAGNOSIS — I5022 Chronic systolic (congestive) heart failure: Secondary | ICD-10-CM | POA: Diagnosis not present

## 2018-04-14 DIAGNOSIS — Z9889 Other specified postprocedural states: Secondary | ICD-10-CM | POA: Diagnosis not present

## 2018-04-14 DIAGNOSIS — Z87891 Personal history of nicotine dependence: Secondary | ICD-10-CM | POA: Insufficient documentation

## 2018-04-14 DIAGNOSIS — E785 Hyperlipidemia, unspecified: Secondary | ICD-10-CM

## 2018-04-14 DIAGNOSIS — Z955 Presence of coronary angioplasty implant and graft: Secondary | ICD-10-CM | POA: Insufficient documentation

## 2018-04-14 DIAGNOSIS — R9431 Abnormal electrocardiogram [ECG] [EKG]: Secondary | ICD-10-CM | POA: Diagnosis not present

## 2018-04-14 DIAGNOSIS — Z79899 Other long term (current) drug therapy: Secondary | ICD-10-CM | POA: Insufficient documentation

## 2018-04-14 DIAGNOSIS — I255 Ischemic cardiomyopathy: Secondary | ICD-10-CM | POA: Insufficient documentation

## 2018-04-14 DIAGNOSIS — I2510 Atherosclerotic heart disease of native coronary artery without angina pectoris: Secondary | ICD-10-CM | POA: Diagnosis not present

## 2018-04-14 DIAGNOSIS — N400 Enlarged prostate without lower urinary tract symptoms: Secondary | ICD-10-CM | POA: Diagnosis not present

## 2018-04-14 DIAGNOSIS — I110 Hypertensive heart disease with heart failure: Secondary | ICD-10-CM | POA: Diagnosis not present

## 2018-04-14 DIAGNOSIS — Z7982 Long term (current) use of aspirin: Secondary | ICD-10-CM | POA: Diagnosis not present

## 2018-04-14 DIAGNOSIS — Z7902 Long term (current) use of antithrombotics/antiplatelets: Secondary | ICD-10-CM | POA: Diagnosis not present

## 2018-04-14 LAB — LIPID PANEL
Cholesterol: 125 mg/dL (ref 0–200)
HDL: 46 mg/dL (ref >40)
LDL CALC: 51 mg/dL (ref 0–99)
Total CHOL/HDL Ratio: 2.7 RATIO
Triglycerides: 142 mg/dL (ref <150)
VLDL: 28 mg/dL (ref 0–40)

## 2018-04-14 LAB — BASIC METABOLIC PANEL
ANION GAP: 9 (ref 5–15)
BUN: 18 mg/dL (ref 8–23)
CALCIUM: 9 mg/dL (ref 8.9–10.3)
CO2: 23 mmol/L (ref 22–32)
Chloride: 105 mmol/L (ref 98–111)
Creatinine, Ser: 1.02 mg/dL (ref 0.61–1.24)
GFR calc Af Amer: >60 mL/min (ref >60)
GFR calc non Af Amer: >60 mL/min (ref >60)
GLUCOSE: 103 mg/dL — AB (ref 70–99)
Potassium: 3.8 mmol/L (ref 3.5–5.1)
Sodium: 137 mmol/L (ref 135–145)

## 2018-04-14 NOTE — Patient Instructions (Addendum)
Labs today We will only contact you if something comes back abnormal or we need to make some changes. Otherwise no news is good news!  Your physician has requested that you have an echocardiogram. Echocardiography is a painless test that uses sound waves to create images of your heart. It provides your doctor with information about the size and shape of your heart and how well your heart's chambers and valves are working. This procedure takes approximately one hour. There are no restrictions for this procedure. Radiology will call you to make an appointment.   Your physician recommends that you schedule a follow-up appointment in: 6 months with Dr. Aundra Dubin. Please call us in May if you do not get a call to schedule your follow up.

## 2018-04-16 NOTE — Progress Notes (Signed)
Patient ID: Erik [name], male   DOB: 1940/07/29, 78 y.o.   MRN: [id number] PCP: Dr [name] Vascular: Dr Scot Dock Cardiology: Dr Aundra Dubin   HPI: Erik [name] is a 78 y.o. with a history of COPD, HTN , PAD with bilateral CEAs, ICM, CAD s/p PCI of proximal LAD and proximal RCA (0/0938), chronic systolic HF and prior tobacco abuse. He follows at [facility] - Dr. Sammuel Hines. 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 Also had left renal artery stenting.   Admitted with dyspnea in 08/2013. Found to have 2v CAD and EF 20-25%, which was a new finding. He wore a lifevest. EF recovered on 05/2015 echo with EF 55-60%.   He returns today for followup of CHF and CAD.  He has been doing reasonably well.  No chest pain.  He is short of breath walking up stairs or hills chronically.  No orthopnea/PND. He gets bilateral thigh pain walking up a hill, PAD followed by [facility].  No palpitations.  SBP 100s-130s when he checks at home.  Occasionally feels lightheaded when BP gets down into the 100s.   ECG (personally reviewed): NSR, LBBB (personally reviewed).   Labs 03/2016, K 3.8, creatinine 1.00 Labs 09/13/13 K 4.1 Creatinine 0.95  Labs 10/15/13 K 4.1 Cr 0.7 => 1.07 Labs 2/19: K 4.2, creatinine 0.98, LDL 49, HDL 43  ROS: All systems negative except as listed in HPI, PMH and Problem List.  Family Status  Relation Name Status  . Brother  Alive  . Mother  Alive       HTN, Born 75  . Father  Deceased       brain tumor  . Brother  Alive       HLD, Heart disease  . Brother  Alive       HLD, CAD  . Brother  Alive       HLD, MS  . Neg Hx  (Not Specified)    Social History   Socioeconomic History  . Marital status: Married    Spouse name: Not on file  . Number of children: Not on file  . Years of education: Not on file  . Highest education level: Not on file  Occupational History  . Occupation: Retired Research officer, trade union  . Financial resource strain: Not on file   . Food insecurity:    Worry: Not on file    Inability: Not on file  . Transportation needs:    Medical: Not on file    Non-medical: Not on file  Tobacco Use  . Smoking status: Former Smoker    Packs/day: 1.50    Years: 0.50    Pack years: 0.75    Types: Cigarettes    Last attempt to quit: 08/27/2013    Years since quitting: 4.6  . Smokeless tobacco: Former Systems developer    Quit date: 09/10/2013  Substance and Sexual Activity  . Alcohol use: No  . Drug use: No  . Sexual activity: Yes  Lifestyle  . Physical activity:    Days per week: Not on file    Minutes per session: Not on file  . Stress: Not on file  Relationships  . Social connections:    Talks on phone: Not on file    Gets together: Not on file    Attends religious service: Not on file    Active member of club or organization: Not on file    Attends meetings of clubs or organizations:  Not on file    Relationship status: Not on file  . Intimate partner violence:    Fear of current or ex partner: Not on file    Emotionally abused: Not on file    Physically abused: Not on file    Forced sexual activity: Not on file  Other Topics Concern  . Not on file  Social History Narrative   Lives with wife in [place]. Retired from the post office    Past Medical History:  Diagnosis Date  . BPH (benign prostatic hypertrophy)   . Carotid artery stenosis    a. Bilateral CEA  . Cataract   . Chronic systolic heart failure (HCC)    a. EF 20-25%, mild LVH, mod HK, mid apicalanteroseptal myocardium, mild Erik, LA mod dilated  . Collagen vascular disease ([place])   . COPD (chronic obstructive pulmonary disease) ([place])   . Coronary artery disease    a. LHC (08/2013): Lmain: short 30% distal, LAD: sml D1 & D2, 70% ostial D1, 95-99% LAD stenosis prox D2 LCx: sml/mod ramus subtot. occluded, 40% ostial set off lg OM1, 40% AV LCx after OM1, RCA: 90% prox (DES to RCA and prox LAD)  . Heart murmur   . History of colonic polyps   . Hyperlipidemia    . Hypertension   . Ischemic cardiomyopathy     Current Outpatient Medications  Medication Sig Dispense Refill  . acetaminophen (TYLENOL) 325 MG tablet Take 325 mg by mouth.    Marland Kitchen aspirin 81 MG tablet Take 81 mg by mouth daily.      . clopidogrel (PLAVIX) 75 MG tablet TAKE 1 TABLET BY MOUTH EVERY DAY 30 tablet 0  . ezetimibe-simvastatin (VYTORIN) 10-40 MG tablet TAKE 1 TABLET AT BEDTIME BY MOUTH. 90 tablet 2  . famotidine (PEPCID) 20 MG tablet TAKE 1 TABLET (20 MG TOTAL) BY MOUTH 2 (TWO) TIMES DAILY. 60 tablet 5  . furosemide (LASIX) 20 MG tablet TAKE 1 TABLET BY MOUTH EVERY DAY 90 tablet 0  . lisinopril (PRINIVIL,ZESTRIL) 5 MG tablet Take 0.5 tablets (2.5 mg total) by mouth daily. 15 tablet 2  . metoprolol succinate (TOPROL-XL) 50 MG 24 hr tablet TAKE 1 TABLET (50 MG TOTAL) BY MOUTH DAILY. TAKE WITH OR IMMEDIATELY FOLLOWING A MEAL. 90 tablet 0  . omega-3 acid ethyl esters (LOVAZA) 1 g capsule Take by mouth. Reported on 06/05/2015    . tamsulosin (FLOMAX) 0.4 MG CAPS capsule TAKE 1 CAPSULE (0.4 MG TOTAL) BY MOUTH DAILY. 90 capsule 3  . nitroGLYCERIN (NITROSTAT) 0.4 MG SL tablet Place 1 tablet (0.4 mg total) under the tongue every 5 (five) minutes x 3 doses as needed for chest pain. (Patient not taking: Reported on 04/14/2018) 30 tablet 3   No current facility-administered medications for this encounter.     Vitals:   04/14/18 1012  BP: (!) 144/60  Pulse: 83  SpO2: 98%  Weight: 65 kg (143 lb 3.2 oz)     PHYSICAL EXAM: General: NAD Neck: No JVD, no thyromegaly or thyroid nodule.  Lungs: Clear to auscultation bilaterally with normal respiratory effort. CV: Distant heart sounds.  Nondisplaced PMI.  Heart regular S1/S2, no S3/S4, no murmur.  No peripheral edema.  Left carotid bruit.  2+ PT bilaterally.  Abdomen: Soft, nontender, no hepatosplenomegaly, no distention.  Skin: Intact without lesions or rashes.  Neurologic: Alert and oriented x 3.  Psych: Normal affect. Extremities: No  clubbing or cyanosis.  HEENT: Normal.   ASSESSMENT & PLAN:  1. Chronic Systolic  Heart Failure: Ischemic cardiomyopathy, EF 20-25% (08/2013), EF recovered on 2017 echo with EF 55-60%. NYHA class II symptoms. He is not volume overloaded on exam. His Entresto was stopped in 2015 for dizziness. EF now recovered.  - Continue Lasix 20mg  daily.  - Continue lisinopril 2.5mg  daily. BMET today.  - Continue Toprol XL 50 mg daily.  - Repeat echo to make sure that EF remains normal.  2. CAD: LHC 09/11/13 with 2 vessel disease that required PCI of proximal LAD and proximal RCA.  No chest pain.  - Continue Plavix + ASA. Will be on Plavix indefinitely.  - Continue Vytorin, check lipids today.  3. COPD: No longer smoking, normal spirometry in 2014.  4. H/O of bilateral CEAs: Followed by [facility]. - Continue ASA and Plavix.  5. History of aortobiiliac endograft with Dr. Sammuel Hines at [facility]. Follows for PAD with Sammuel Hines and [facility].  6. Hyperlipidemia:  - Lipid panel today.  - Continue Vytorin.    Follow up in 6 months   [name] 04/16/2018

## 2018-04-18 ENCOUNTER — Other Ambulatory Visit (HOSPITAL_COMMUNITY): Payer: Self-pay | Admitting: Cardiology

## 2018-04-26 ENCOUNTER — Other Ambulatory Visit: Payer: Self-pay | Admitting: Family Medicine

## 2018-05-11 ENCOUNTER — Other Ambulatory Visit (HOSPITAL_COMMUNITY): Payer: Self-pay | Admitting: Cardiology

## 2018-05-12 ENCOUNTER — Telehealth: Payer: Self-pay | Admitting: Family Medicine

## 2018-05-12 DIAGNOSIS — R7303 Prediabetes: Secondary | ICD-10-CM

## 2018-05-12 DIAGNOSIS — I10 Essential (primary) hypertension: Secondary | ICD-10-CM

## 2018-05-12 DIAGNOSIS — E785 Hyperlipidemia, unspecified: Secondary | ICD-10-CM

## 2018-05-12 NOTE — Telephone Encounter (Signed)
-----   Message from Eustace Pen, LPN sent at 09/29/2097 11:18 AM EST ----- Regarding: Labs 3/2 Lab orders needed. Thank you.

## 2018-05-15 ENCOUNTER — Ambulatory Visit: Payer: Medicare Other

## 2018-05-15 ENCOUNTER — Other Ambulatory Visit (INDEPENDENT_AMBULATORY_CARE_PROVIDER_SITE_OTHER): Payer: Medicare Other

## 2018-05-15 DIAGNOSIS — R7303 Prediabetes: Secondary | ICD-10-CM

## 2018-05-15 DIAGNOSIS — E785 Hyperlipidemia, unspecified: Secondary | ICD-10-CM

## 2018-05-15 LAB — COMPREHENSIVE METABOLIC PANEL
ALK PHOS: 61 U/L (ref 39–117)
ALT: 10 U/L (ref 0–53)
AST: 14 U/L (ref 0–37)
Albumin: 3.9 g/dL (ref 3.5–5.2)
BUN: 19 mg/dL (ref 6–23)
CALCIUM: 9 mg/dL (ref 8.4–10.5)
CO2: 29 mEq/L (ref 19–32)
Chloride: 103 mEq/L (ref 96–112)
Creatinine, Ser: 1.04 mg/dL (ref 0.40–1.50)
GFR: 69.27 mL/min (ref >60.00)
Glucose, Bld: 95 mg/dL (ref 70–99)
Potassium: 3.9 mEq/L (ref 3.5–5.1)
Sodium: 137 mEq/L (ref 135–145)
Total Bilirubin: 0.3 mg/dL (ref 0.2–1.2)
Total Protein: 7.1 g/dL (ref 6.0–8.3)

## 2018-05-15 LAB — HEMOGLOBIN A1C: Hgb A1c MFr Bld: 6.1 % (ref 4.6–6.5)

## 2018-05-15 LAB — LIPID PANEL
Cholesterol: 109 mg/dL (ref 0–200)
HDL: 44.2 mg/dL (ref >39.00)
LDL Cholesterol: 44 mg/dL (ref 0–99)
NonHDL: 64.76
Total CHOL/HDL Ratio: 2
Triglycerides: 105 mg/dL (ref 0.0–149.0)
VLDL: 21 mg/dL (ref 0.0–40.0)

## 2018-05-19 ENCOUNTER — Encounter: Payer: Medicare Other | Admitting: Family Medicine

## 2018-05-19 DIAGNOSIS — H2513 Age-related nuclear cataract, bilateral: Secondary | ICD-10-CM | POA: Diagnosis not present

## 2018-05-29 ENCOUNTER — Telehealth: Payer: Self-pay | Admitting: Family Medicine

## 2018-05-29 NOTE — Telephone Encounter (Signed)
Spoke with spouse about r/s pt appointment o June.  She stated she would check with pt and call the office back

## 2018-05-30 ENCOUNTER — Encounter: Payer: Self-pay | Admitting: Family Medicine

## 2018-05-30 ENCOUNTER — Encounter: Payer: Medicare Other | Admitting: Family Medicine

## 2018-05-30 ENCOUNTER — Other Ambulatory Visit: Payer: Self-pay

## 2018-05-30 ENCOUNTER — Ambulatory Visit (INDEPENDENT_AMBULATORY_CARE_PROVIDER_SITE_OTHER): Payer: Medicare Other | Admitting: Family Medicine

## 2018-05-30 VITALS — BP 120/60 | HR 80 | Temp 97.7°F | Ht 66.0 in | Wt 140.0 lb

## 2018-05-30 DIAGNOSIS — Z0000 Encounter for general adult medical examination without abnormal findings: Secondary | ICD-10-CM | POA: Diagnosis not present

## 2018-05-30 DIAGNOSIS — E785 Hyperlipidemia, unspecified: Secondary | ICD-10-CM | POA: Diagnosis not present

## 2018-05-30 DIAGNOSIS — I714 Abdominal aortic aneurysm, without rupture, unspecified: Secondary | ICD-10-CM

## 2018-05-30 DIAGNOSIS — R7303 Prediabetes: Secondary | ICD-10-CM

## 2018-05-30 DIAGNOSIS — G2581 Restless legs syndrome: Secondary | ICD-10-CM

## 2018-05-30 DIAGNOSIS — J449 Chronic obstructive pulmonary disease, unspecified: Secondary | ICD-10-CM | POA: Diagnosis not present

## 2018-05-30 DIAGNOSIS — I5022 Chronic systolic (congestive) heart failure: Secondary | ICD-10-CM

## 2018-05-30 DIAGNOSIS — I10 Essential (primary) hypertension: Secondary | ICD-10-CM | POA: Diagnosis not present

## 2018-05-30 MED ORDER — ROPINIROLE HCL 0.25 MG PO TABS: ORAL_TABLET | ORAL | 0 refills | Status: DC

## 2018-05-30 NOTE — Patient Instructions (Addendum)
Plan on colonoscopy after 06/2018 this year.  Quit smoking!  Keep working on [employer] and  Start some regular exercise.

## 2018-05-30 NOTE — Assessment & Plan Note (Signed)
Pt pre-contempaltive for smoking cessation. Refuses medication for cessation.  Minimal symptoms and pt with no desire to treat or re-eval at this time.

## 2018-05-30 NOTE — Assessment & Plan Note (Signed)
Improved control. 

## 2018-05-30 NOTE — Assessment & Plan Note (Signed)
Trial of requip, low dose titrate up.

## 2018-05-30 NOTE — Assessment & Plan Note (Signed)
Well controlled. Continue current medication.  

## 2018-05-30 NOTE — Progress Notes (Signed)
Subjective:    Patient ID: NIL Erik [name], male    DOB: 09-24-1940, 78 y.o.   MRN: [id number]  HPI The patient presents for annual medicare wellness, complete physical and review of chronic health problems. He/She also has the following acute concerns today: restless leg.. see below  Hypertension:   Good control on toprol, lisinopril .Marland Kitchen off entresto due to dizziness BP Readings from Last 3 Encounters:  05/30/18 120/60  04/14/18 (!) 144/60  08/22/17 114/69  Using medication without problems or lightheadedness:  none Chest pain with exertion:none Edema:none Short of breath:  occ with climbing stairs, stable Average home BPs: Other issues:  Elevated Cholesterol: LDL at goal on vytorin 10/40 mg daily. Using medications without problems:none Muscle aches: none Diet compliance: moderate Exercise: minimal Other complaints:  B carotid stenosis, PAD claudication:L followed by Vascular MD, Dr. Doren Custard Osborne Oman NP.  XBJ:YNWGNFAO at [facility] by Dr. Corky Sox, s/p renal stent ( 2-vessel FEVAR with a Z-Fen device onMarch 15,2017 by Dr. 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  CAD, CHF followed by cards in [place] cath 2015. ECHO 2017  recoveredEF 55-60%  Last OV in 03/2018 reviewed..   Euvolemic on lasix.  COPD , mild: no daily cough, stable exertional chest pain. No interested in inhaler treatment. No SOB.Last spirometry 04/18/2015 nml on no meds.  BPH stable on flomax.  RLS: new diagnosis: feels like legs jumps at night, creepy crawly feeling in legs.Marland Kitchen at night and when sitting quietly.  No cramp and no aching.  Feels better to get up and move.  no claudication.   ongoing for years.   Advance directives and end of life planning reviewed in detail with patient and documented in EMR. Patient given handout on advance care directives if needed. HCPOA and living will  updated if needed.    Office Visit from 05/30/2018 in [place] at [facility] Total Score  0       Hearing Screening   Method: Audiometry   125Hz  250Hz  500Hz  1000Hz  2000Hz  3000Hz  4000Hz  6000Hz  8000Hz   Right ear:   25 0 0  0    Left ear:   20 20 20   0    Vision Screening Comments: Eye Exam with Dr. George Ina at [facility] Has Cataracts Bilateral   Fall Risk  05/30/2018 05/09/2017 04/20/2016 04/18/2015 04/16/2014  Falls in the past year? 0 No No No No    Social History /Family History/Past Medical History reviewed in detail and updated in EMR if needed.  Blood pressure 120/60, pulse 80, temperature 97.7 F (36.5 C), temperature source Oral, height 5\' 6"  (1.676 m), weight 140 lb (63.5 kg).  Review of Systems  Constitutional: Negative for fatigue and fever.  HENT: Negative for ear pain.   Eyes: Negative for pain.  Respiratory: Negative for cough and shortness of breath.   Cardiovascular: Negative for chest pain, palpitations and leg swelling.  Gastrointestinal: Negative for abdominal pain.  Genitourinary: Negative for dysuria.  Musculoskeletal: Negative for arthralgias.  Neurological: Negative for syncope, light-headedness and headaches.  Psychiatric/Behavioral: Negative for dysphoric mood.       Objective:   Physical Exam Constitutional:      Appearance: He is well-developed.  HENT:     Head: Normocephalic.     Right Ear: Hearing normal.     Left Ear: Hearing normal.     Nose: Nose normal.  Neck:  Thyroid: No thyroid mass or thyromegaly.     Vascular: No carotid bruit.     Trachea: Trachea normal.  Cardiovascular:     Rate and Rhythm: Normal rate and regular rhythm.     Pulses: Normal pulses.     Heart sounds: Heart sounds not distant. No murmur. No friction rub. No gallop.      Comments: No peripheral edema Pulmonary:     Effort: Pulmonary effort is normal. No respiratory distress.     Breath sounds: Normal breath sounds.  Skin:     General: Skin is warm and dry.     Findings: No rash.  Psychiatric:        Speech: Speech normal.        Behavior: Behavior normal.        Thought Content: Thought content normal.           Assessment & Plan:  The patient's preventative maintenance and recommended screening tests for an annual wellness exam were reviewed in full today. Brought up to date unless services declined.  Counselled on the importance of diet, exercise, and its role in overall health and mortality. The patient's FH and SH was reviewed, including their home life, tobacco status, and drug and alcohol status.   Td, PNA: Up to date.Refused flu. Shingles:not interested  PSA: Hx of BPH on tamsulosin. PSA not indicated  Colon: 06/2008 Dr. Sharlett Iles, repeat 10 years hyperplastic polyps  Smoker: spirometry performed 2017 nml.. he has restarted 1/2 pack per day... in last year Pulmonar nodules: 04/2014 no change in nodules.  CT  chest 2017at [facility]: showed no change in pulmonary nodules   no further eval needed.

## 2018-05-30 NOTE — Assessment & Plan Note (Signed)
Followed by vascular along with PAD and carotid stenosis s/p CEA bilateral.

## 2018-05-30 NOTE — Assessment & Plan Note (Signed)
Followed by cardiology. At last check EF recovered. Plans repeat ECHO.. pt encouraged to call to set this up.

## 2018-06-17 ENCOUNTER — Other Ambulatory Visit (HOSPITAL_COMMUNITY): Payer: Self-pay | Admitting: Cardiology

## 2018-06-21 ENCOUNTER — Other Ambulatory Visit: Payer: Self-pay | Admitting: Family Medicine

## 2018-06-21 NOTE — Telephone Encounter (Signed)
Last office visit 05/30/2018 for CPE.  Last refilled 05/30/2018 for #60 with no refills.  Next Appt:  03/23/20201 for CPE.

## 2018-06-24 ENCOUNTER — Other Ambulatory Visit: Payer: Self-pay | Admitting: Family Medicine

## 2018-06-28 ENCOUNTER — Other Ambulatory Visit (HOSPITAL_COMMUNITY): Payer: Self-pay | Admitting: Cardiology

## 2018-06-30 ENCOUNTER — Other Ambulatory Visit (HOSPITAL_COMMUNITY): Payer: Self-pay | Admitting: Cardiology

## 2018-07-17 ENCOUNTER — Encounter: Payer: Self-pay | Admitting: Orthopaedic Surgery

## 2018-07-17 ENCOUNTER — Ambulatory Visit (INDEPENDENT_AMBULATORY_CARE_PROVIDER_SITE_OTHER): Payer: Medicare Other

## 2018-07-17 ENCOUNTER — Ambulatory Visit (INDEPENDENT_AMBULATORY_CARE_PROVIDER_SITE_OTHER): Payer: Medicare Other | Admitting: Orthopaedic Surgery

## 2018-07-17 ENCOUNTER — Other Ambulatory Visit: Payer: Self-pay

## 2018-07-17 DIAGNOSIS — M5441 Lumbago with sciatica, right side: Secondary | ICD-10-CM

## 2018-07-17 DIAGNOSIS — G8929 Other chronic pain: Secondary | ICD-10-CM

## 2018-07-17 DIAGNOSIS — M25551 Pain in right hip: Secondary | ICD-10-CM

## 2018-07-17 MED ORDER — METHYLPREDNISOLONE 4 MG PO TBPK: ORAL_TABLET | ORAL | 1 refills | Status: DC

## 2018-07-17 MED ORDER — TRAMADOL HCL 50 MG PO TABS: 50.0000 mg | ORAL_TABLET | Freq: Four times a day (QID) | ORAL | 0 refills | Status: DC | PRN

## 2018-07-17 MED ORDER — METHOCARBAMOL 500 MG PO TABS: 500.0000 mg | ORAL_TABLET | Freq: Four times a day (QID) | ORAL | 1 refills | Status: DC | PRN

## 2018-07-17 NOTE — Progress Notes (Signed)
Office Visit Note   Patient: Erik [name]           Date of Birth: 10/19/1940           MRN: [id number] Visit Date: 07/17/2018              Requested by: [name], MD [place], [place] [address] PCP: [name], MD   Assessment & Plan: Visit Diagnoses:  1. Pain in right hip   2. Chronic right-sided low back pain with right-sided sciatica     Plan: His signs symptoms and x-rays are consistent with degenerative changes in the lumbar spine with right-sided sciatica.  Since he cannot take anti-inflammatories we will put him on a 6-day steroid taper combined with some tramadol and a muscle relaxant.  I would like to see him back in about 2 weeks to see if he would consider physical therapy on his lumbar spine versus whether or not we would need to obtain an MRI to determine whether or not an intervention such as an epidural steroid would be worthwhile.  All questions concerns were answered and addressed.  We will see him back in 2 weeks.  Follow-Up Instructions: Return in about 2 weeks (around 07/31/2018).   Orders:  Orders Placed This Encounter  Procedures  . XR Lumbar Spine 2-3 Views  . XR HIP UNILAT W OR W/O PELVIS 1V RIGHT   Meds ordered this encounter  Medications  . methylPREDNISolone (MEDROL DOSEPAK) 4 MG TBPK tablet    Sig: Taper steroid over 6 days.    Dispense:  21 tablet    Refill:  1  . traMADol (ULTRAM) 50 MG tablet    Sig: Take 1-2 tablets (50-100 mg total) by mouth every 6 (six) hours as needed.    Dispense:  40 tablet    Refill:  0  . methocarbamol (ROBAXIN) 500 MG tablet    Sig: Take 1 tablet (500 mg total) by mouth every 6 (six) hours as needed for muscle spasms.    Dispense:  60 tablet    Refill:  1      Procedures: No procedures performed   Clinical Data: No additional findings.   Subjective: Chief Complaint  Patient presents with  . Right Hip - Pain  The patient is a very thin 78 year old gentleman who comes in  for evaluation treatment of right hip and back pain is been going on for many years now.  The back pain radiates down his leg.  He denies any groin pain at all.  Is been slowly getting worse.  He did help a family number recently do some type of building activity and it did flare things up.  He denies any change in bladder or bowel function.  He cannot take anti-inflammatories and only takes Tylenol.  He is on Plavix.  He has a history of significant surgery to his aorta in the abdomen.  This was some type of graft procedure.  He has significant arthrosclerosis of his arteries.  HPI  Review of Systems He is ambulating with a walker.  He denies any headache, chest pain, shortness of breath, fever, chills, nausea, vomiting he denies any numbness or weakness in his feet.  Objective: Vital Signs: There were no vitals taken for this visit.  Physical Exam He is alert and oriented x3 and in no acute distress Ortho Exam Examination of both hips show he has fluid range of motion of both hips with no pain  in the groin at all.  He has no pain with palpation of the trochanteric area of both hips.  He has a positive straight leg raise to the right side and pain over the posterior pelvis and posterior spinal elements to the right side.  He has pain with flexion extension of the lumbar spine. Specialty Comments:  No specialty comments available.  Imaging: Xr Hip Unilat W Or W/o Pelvis 1v Right  Result Date: 07/17/2018 An AP pelvis and lateral the right hip shows no acute findings.  The hip joint space is well-maintained.  Xr Lumbar Spine 2-3 Views  Result Date: 07/17/2018 An AP and lateral lumbar spine shows severe degenerative disc disease between L5 and S1 with also posterior element osteoarthritis.  The alignment of the spine is maintained    PMFS History: Patient Active Problem List   Diagnosis Date Noted  . Restless leg syndrome 05/30/2018  . Abdominal aortic aneurysm (AAA) ([place]) 07/07/2015  .  Arteriosclerosis of coronary artery 07/07/2015  . Chronic obstructive pulmonary disease ([place]) 07/07/2015  . Cardiomyopathy, ischemic 07/07/2015  . 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  . Solitary pulmonary nodule 04/16/2014  . Counseling regarding end of life decision making 04/16/2014  . HTN (hypertension) 10/03/2013  . CAD S/P percutaneous coronary angioplasty 09/19/2013  . Chronic systolic heart failure ([place]) 09/19/2013  . NSTEMI (non-ST elevated myocardial infarction) ([place]) 09/10/2013  . Family history of coronary artery disease in brother 09/10/2013  . ED (erectile dysfunction) 12/10/2011  . COPD, mild ([place]) 06/20/2008  . COLONIC POLYPS, ADENOMATOUS 06/05/2008  . BPH (benign prostatic hyperplasia) 06/05/2008  . Prediabetes 05/14/2008  . VENEREAL WART 05/07/2008  . ONYCHOMYCOSIS, TOENAILS 05/07/2008  . Hyperlipidemia 05/07/2008  . Bilateral carotid artery stenosis 05/07/2008  . INTERMITTENT CLAUDICATION 05/07/2008  . HEART MURMUR, HX OF 05/07/2008  . COLONIC POLYPS, HX OF 05/07/2008   Past Medical History:  Diagnosis Date  . BPH (benign prostatic hypertrophy)   . Carotid artery stenosis    a. Bilateral CEA  . Cataract   . Chronic systolic heart failure (HCC)    a. EF 20-25%, mild LVH, mod HK, mid apicalanteroseptal myocardium, mild MR, LA mod dilated  . Collagen vascular disease ([place])   . COPD (chronic obstructive pulmonary disease) ([place])   . Coronary artery disease    a. LHC (08/2013): Lmain: short 30% distal, LAD: sml D1 & D2, 70% ostial D1, 95-99% LAD stenosis prox D2 LCx: sml/mod ramus subtot. occluded, 40% ostial set off lg OM1, 40% AV LCx after OM1, RCA: 90% prox (DES to RCA and prox LAD)  . Heart murmur   . History of colonic polyps   . Hyperlipidemia   . Hypertension   . Ischemic cardiomyopathy     Family History  Problem Relation Age of Onset  . Heart disease Brother   . Hyperlipidemia Brother   . Hypertension Mother   . Cancer Father    . Hyperlipidemia Brother   . Hyperlipidemia Brother   . Heart attack Brother   . Hyperlipidemia Brother   . Multiple sclerosis Brother   . Kidney disease Neg Hx   . Prostate cancer Neg Hx     Past Surgical History:  Procedure Laterality Date  . cardiac stents  09-2013  . CAROTID ENDARTERECTOMY  04/17/2008   right  . CAROTID ENDARTERECTOMY  05/30/08   Left  . CHOLECYSTECTOMY     Gall Bladder  . CORONARY ANGIOGRAM  09/11/2013   Procedure: CORONARY ANGIOGRAM;  Surgeon: Joyice Faster  Claiborne Billings, MD;  Location: [facility] CATH LAB;  Service: Cardiovascular;;  . HERNIA REPAIR    . lower aorta aneurysm  05/26/15  . PERCUTANEOUS STENT INTERVENTION  09/11/2013   Procedure: PERCUTANEOUS STENT INTERVENTION;  Surgeon: Troy Sine, MD;  Location: [facility] CATH LAB;  Service: Cardiovascular;;  DES Prox RCA 3.5x15 xience    Social History   Occupational History  . Occupation: Retired Development worker, community  Tobacco Use  . Smoking status: Current Every Day Smoker    Packs/day: 1.50    Years: 0.50    Pack years: 0.75    Types: Cigarettes    Last attempt to quit: 08/27/2013    Years since quitting: 4.8  . Smokeless tobacco: Former Systems developer    Quit date: 09/10/2013  Substance and Sexual Activity  . Alcohol use: No  . Drug use: No  . Sexual activity: Yes

## 2018-07-18 ENCOUNTER — Other Ambulatory Visit: Payer: Self-pay | Admitting: Family Medicine

## 2018-07-18 ENCOUNTER — Ambulatory Visit: Admit: 2018-07-18 | Payer: Medicare Other | Admitting: Ophthalmology

## 2018-07-18 SURGERY — PHACOEMULSIFICATION, CATARACT, WITH IOL INSERTION
Anesthesia: Choice | Laterality: Right

## 2018-07-18 NOTE — Telephone Encounter (Signed)
Received [facility] from [facility].  I spoke with Erik [name] to see how he was taking the Ropinirole because the instructions states to take on tablet at bedtime for 2 days if not effective increase to 2 tablets.   He states he is taking 2 tablets a bedtime.  He states it helps him sleep but hasn't done much for the restless legs.  I ask if he wanted to schedule a virtual appointment with Dr. [name] to follow up and to discuss other options.  He wants to try the medication for one more month and if still is having issues, he will call to make an appointment.  [facility] [place]  He is also currently having issues with his back and was seen by Ortho yesterday and they put him on prednisone and methocarbamol.  FYI to Dr. [name]

## 2018-07-21 ENCOUNTER — Telehealth: Payer: Self-pay | Admitting: Internal Medicine

## 2018-07-21 ENCOUNTER — Encounter: Payer: Self-pay | Admitting: Gastroenterology

## 2018-07-21 NOTE — Telephone Encounter (Signed)
See note below. Please advise.  

## 2018-07-24 ENCOUNTER — Telehealth (HOSPITAL_COMMUNITY): Payer: Self-pay

## 2018-07-24 ENCOUNTER — Encounter: Payer: Self-pay | Admitting: *Deleted

## 2018-07-24 NOTE — Telephone Encounter (Signed)
Patient scheduled for televisit on 07/27/2018 with Dr.Pyrtle. Did notify patient that Dr.Pyrtle may want to see him in office but he will be called to confirm this prior to his appointment.

## 2018-07-24 NOTE — Telephone Encounter (Signed)
COVID-19 Pre-Screening Questions:  . Do you currently have a fever? no(yes = cancel and refer to pcp for e-visit) . Have you recently travelled on a cruise, internationally, or to [place], [place], [place], [place], [place], or [place], [place] [employer]) ? No (yes = cancel, stay home, monitor symptoms, and contact pcp or initiate e-visit if symptoms develop) . Have you been in contact with someone that is currently pending confirmation of Covid19 testing or has been confirmed to have the [place] virus?  no (yes = cancel, stay home, away from tested individual, monitor symptoms, and contact pcp or initiate e-visit if symptoms develop) . Are you currently experiencing fatigue or cough? No (yes = pt should be prepared to have a mask placed at the time of their visit). . Reiterated no additional visitors. Eartha Inch no earlier than 15 minutes before appointment time. . Please bring own mask.

## 2018-07-24 NOTE — Telephone Encounter (Signed)
I have spoken to patient who indicates that he is having darker stool, "almost black" x about 6 months along with multiple watery stools per day. He is on anticoagulation. He is in agreement to come for visit tomorrow. Dr Hilarie Fredrickson has okayed in office visit.  Patient denies all COVID-19 symptom questions.

## 2018-07-24 NOTE — Telephone Encounter (Signed)
Okay 

## 2018-07-24 NOTE — Telephone Encounter (Signed)
Dr. Hilarie Fredrickson will accept this patient. It is okay to schedule a visit with Dr. Hilarie Fredrickson.

## 2018-07-24 NOTE — Telephone Encounter (Signed)
Dr Hilarie Fredrickson, do you need this to be an in office appt since pt is on anticoag with dark stool and pain or would telephone appt ok for now?

## 2018-07-25 ENCOUNTER — Encounter: Payer: Self-pay | Admitting: Internal Medicine

## 2018-07-25 ENCOUNTER — Other Ambulatory Visit: Payer: Self-pay

## 2018-07-25 ENCOUNTER — Telehealth: Payer: Self-pay | Admitting: *Deleted

## 2018-07-25 ENCOUNTER — Ambulatory Visit (INDEPENDENT_AMBULATORY_CARE_PROVIDER_SITE_OTHER): Payer: Medicare Other | Admitting: Internal Medicine

## 2018-07-25 ENCOUNTER — Other Ambulatory Visit (INDEPENDENT_AMBULATORY_CARE_PROVIDER_SITE_OTHER): Payer: Medicare Other

## 2018-07-25 VITALS — BP 100/34 | HR 88 | Temp 98.3°F | Ht 66.0 in | Wt 136.2 lb

## 2018-07-25 DIAGNOSIS — R634 Abnormal weight loss: Secondary | ICD-10-CM

## 2018-07-25 DIAGNOSIS — Z7902 Long term (current) use of antithrombotics/antiplatelets: Secondary | ICD-10-CM | POA: Diagnosis not present

## 2018-07-25 DIAGNOSIS — R110 Nausea: Secondary | ICD-10-CM

## 2018-07-25 DIAGNOSIS — R195 Other fecal abnormalities: Secondary | ICD-10-CM

## 2018-07-25 DIAGNOSIS — R109 Unspecified abdominal pain: Secondary | ICD-10-CM

## 2018-07-25 DIAGNOSIS — K921 Melena: Secondary | ICD-10-CM

## 2018-07-25 DIAGNOSIS — R1084 Generalized abdominal pain: Secondary | ICD-10-CM

## 2018-07-25 LAB — CBC WITH DIFFERENTIAL/PLATELET
Basophils Absolute: 0.2 10*3/uL — ABNORMAL HIGH (ref 0.0–0.1)
Basophils Relative: 1.3 % (ref 0.0–3.0)
Eosinophils Absolute: 0.1 10*3/uL (ref 0.0–0.7)
Eosinophils Relative: 1.1 % (ref 0.0–5.0)
HCT: 36.4 % — ABNORMAL LOW (ref 39.0–52.0)
Hemoglobin: 12.3 g/dL — ABNORMAL LOW (ref 13.0–17.0)
Lymphocytes Relative: 5.8 % — ABNORMAL LOW (ref 12.0–46.0)
Lymphs Abs: 0.8 10*3/uL (ref 0.7–4.0)
MCHC: 33.8 g/dL (ref 30.0–36.0)
MCV: 83 fl (ref 78.0–100.0)
Monocytes Absolute: 1.6 10*3/uL — ABNORMAL HIGH (ref 0.1–1.0)
Monocytes Relative: 12.2 % — ABNORMAL HIGH (ref 3.0–12.0)
Neutro Abs: 10.4 10*3/uL — ABNORMAL HIGH (ref 1.4–7.7)
Neutrophils Relative %: 79.6 % — ABNORMAL HIGH (ref 43.0–77.0)
Platelets: 370 10*3/uL (ref 150.0–400.0)
RBC: 4.39 Mil/uL (ref 4.22–5.81)
RDW: 17.1 % — ABNORMAL HIGH (ref 11.5–15.5)
WBC: 13 10*3/uL — ABNORMAL HIGH (ref 4.0–10.5)

## 2018-07-25 LAB — COMPREHENSIVE METABOLIC PANEL
ALT: 10 U/L (ref 0–53)
AST: 12 U/L (ref 0–37)
Albumin: 3.8 g/dL (ref 3.5–5.2)
Alkaline Phosphatase: 69 U/L (ref 39–117)
BUN: 18 mg/dL (ref 6–23)
CO2: 28 mEq/L (ref 19–32)
Calcium: 8.5 mg/dL (ref 8.4–10.5)
Chloride: 97 mEq/L (ref 96–112)
Creatinine, Ser: 0.99 mg/dL (ref 0.40–1.50)
GFR: 73.28 mL/min (ref >60.00)
Glucose, Bld: 114 mg/dL — ABNORMAL HIGH (ref 70–99)
Potassium: 4.2 mEq/L (ref 3.5–5.1)
Sodium: 132 mEq/L — ABNORMAL LOW (ref 135–145)
Total Bilirubin: 0.6 mg/dL (ref 0.2–1.2)
Total Protein: 7.2 g/dL (ref 6.0–8.3)

## 2018-07-25 LAB — IBC + FERRITIN
Ferritin: 16.4 ng/mL — ABNORMAL LOW (ref 22.0–322.0)
Iron: 47 ug/dL (ref 42–165)
Saturation Ratios: 11.1 % — ABNORMAL LOW (ref 20.0–50.0)
Transferrin: 302 mg/dL (ref 212.0–360.0)

## 2018-07-25 MED ORDER — FERROUS SULFATE 325 (65 FE) MG PO TABS: 325.0000 mg | ORAL_TABLET | Freq: Every day | ORAL | 3 refills | Status: DC

## 2018-07-25 NOTE — Progress Notes (Signed)
Patient ID: Erik [name], male   DOB: April 11, 1940, 78 y.o.   MRN: [id number] HPI: Erik [name] is a 78 year old male with a history of CAD status post PCI on Plavix, history of peripheral vascular disease status post carotid endarterectomy bilaterally, history of CHF, colonic diverticulosis, hypertension, hyperlipidemia, distal hyperplastic colon polyps who is seen to evaluate dark stool.  He was seen by Dr. Sharlett Iles many years ago.  Per our records he had a colonoscopy on 06/26/2008.  This exam revealed multiple polyps in the distal sigmoid and rectum.  Pathology showed hyperplastic polyps and no adenomatous change.  Sigmoid diverticulosis was seen.  I do not see an office visit since this colonoscopy.  He reports that he has developed dark stools over the last 4 to 6 months.  He is having mid abdominal pain.  There is nausea which is worse with leaning over.  When he leans over he feels like his "belly is crushing something".  He has had only one episode of vomiting in the last few months which he relates to taking pain medication for lumbar back pain.  He has loss of appetite and early fullness.  He is lost 5 to 7 pounds.  He has been on Pepcid 20 mg twice daily.  He does not use NSAIDs.  He is noticed some increased dyspnea with exertion but no chest pain.  He has continue Plavix.  With stools he is noticed them again to be darker and also looser than usual he has several bowel movements per day and usually by the end of the day stools are watery and dark.  He has not seen red blood.  He has an echocardiogram under the direction of Dr. Aundra Dubin which is ordered for tomorrow.  His father had a malignancy and he seems to recall this being a GI malignancy but unknown exactly which organ.    Past Medical History:  Diagnosis Date  . Adenomatous colon polyp   . Aortic aneurysm ([place])   . BPH (benign prostatic hypertrophy)   . Carotid artery stenosis    a. Bilateral CEA  . Cataract   . Chronic  systolic heart failure (HCC)    a. EF 20-25%, mild LVH, mod HK, mid apicalanteroseptal myocardium, mild MR, LA mod dilated  . Collagen vascular disease ([place])   . COPD (chronic obstructive pulmonary disease) ([place])   . Coronary artery disease    a. LHC (08/2013): Lmain: short 30% distal, LAD: sml D1 & D2, 70% ostial D1, 95-99% LAD stenosis prox D2 LCx: sml/mod ramus subtot. occluded, 40% ostial set off lg OM1, 40% AV LCx after OM1, RCA: 90% prox (DES to RCA and prox LAD)  . Diverticulosis   . Heart murmur   . History of colonic polyps   . Hyperlipidemia   . Hyperplastic colon polyp   . Hypertension   . Ischemic cardiomyopathy     Past Surgical History:  Procedure Laterality Date  . cardiac stents  09-2013  . CAROTID ENDARTERECTOMY  04/17/2008   right  . CAROTID ENDARTERECTOMY  05/30/08   Left  . CHOLECYSTECTOMY     Gall Bladder  . CORONARY ANGIOGRAM  09/11/2013   Procedure: CORONARY ANGIOGRAM;  Surgeon: Troy Sine, MD;  Location: [facility] CATH LAB;  Service: Cardiovascular;;  . HERNIA REPAIR    . lower aorta aneurysm  05/26/15  . PERCUTANEOUS STENT INTERVENTION  09/11/2013   Procedure: PERCUTANEOUS STENT INTERVENTION;  Surgeon: Troy Sine, MD;  Location: [facility] CATH LAB;  Service:  Cardiovascular;;  DES Prox RCA 3.5x15 xience     Outpatient Medications Prior to Visit  Medication Sig Dispense Refill  . acetaminophen (TYLENOL) 325 MG tablet Take 325 mg by mouth.    Marland Kitchen aspirin 81 MG tablet Take 81 mg by mouth daily.      . clopidogrel (PLAVIX) 75 MG tablet TAKE 1 TABLET BY MOUTH EVERY DAY 30 tablet 1  . ezetimibe-simvastatin (VYTORIN) 10-40 MG tablet TAKE 1 TABLET AT BEDTIME BY MOUTH. 90 tablet 3  . famotidine (PEPCID) 20 MG tablet TAKE 1 TABLET (20 MG TOTAL) BY MOUTH 2 (TWO) TIMES DAILY. 60 tablet 5  . furosemide (LASIX) 20 MG tablet TAKE 1 TABLET BY MOUTH EVERY DAY 90 tablet 1  . lisinopril (PRINIVIL,ZESTRIL) 5 MG tablet Take 0.5 tablets (2.5 mg total) by mouth daily. 15 tablet 2  .  methocarbamol (ROBAXIN) 500 MG tablet Take 1 tablet (500 mg total) by mouth every 6 (six) hours as needed for muscle spasms. 60 tablet 1  . methylPREDNISolone (MEDROL DOSEPAK) 4 MG TBPK tablet Taper steroid over 6 days. 21 tablet 1  . metoprolol succinate (TOPROL-XL) 50 MG 24 hr tablet TAKE 1 TABLET (50 MG TOTAL) BY MOUTH DAILY. TAKE WITH OR IMMEDIATELY FOLLOWING A MEAL. 90 tablet 0  . nitroGLYCERIN (NITROSTAT) 0.4 MG SL tablet Place 1 tablet (0.4 mg total) under the tongue every 5 (five) minutes x 3 doses as needed for chest pain. 30 tablet 3  . omega-3 acid ethyl esters (LOVAZA) 1 g capsule Take by mouth. Reported on 06/05/2015    . rOPINIRole (REQUIP) 0.25 MG tablet Take 2 tablets (0.5 mg total) by mouth at bedtime. 60 tablet 0  . tamsulosin (FLOMAX) 0.4 MG CAPS capsule TAKE 1 CAPSULE (0.4 MG TOTAL) BY MOUTH DAILY. 90 capsule 3  . traMADol (ULTRAM) 50 MG tablet Take 1-2 tablets (50-100 mg total) by mouth every 6 (six) hours as needed. 40 tablet 0   No facility-administered medications prior to visit.     Allergies  Allergen Reactions  . Codeine Swelling    REACTION: Throat swells    Family History  Problem Relation Age of Onset  . Heart disease Brother   . Hyperlipidemia Brother   . Hypertension Mother   . Cancer Father   . Hyperlipidemia Brother   . Hyperlipidemia Brother   . Heart attack Brother   . Hyperlipidemia Brother   . Multiple sclerosis Brother   . Kidney disease Neg Hx   . Prostate cancer Neg Hx     Social History   Tobacco Use  . Smoking status: Current Every Day Smoker    Packs/day: 1.50    Years: 0.50    Pack years: 0.75    Types: Cigarettes    Last attempt to quit: 08/27/2013    Years since quitting: 4.9  . Smokeless tobacco: Former Systems developer    Quit date: 09/10/2013  Substance Use Topics  . Alcohol use: No  . Drug use: No    ROS: As per history of present illness, otherwise negative  Temp 98.3 F (36.8 C)   Ht 5\' 6"  (1.676 m)   Wt 136 lb 3.2 oz (61.8  kg)   BMI 21.98 kg/m  Constitutional: Well-developed and well-nourished. No distress. HEENT: Normocephalic and atraumatic. Conjunctivae are normal.  No scleral icterus. Neck: Neck supple. Trachea midline. Cardiovascular: Normal rate, regular rhythm and intact distal pulses.  2/6 systolic ejection murmur Pulmonary/chest: Effort normal and breath sounds normal. No wheezing, rales or rhonchi. Abdominal: Soft,  epigastric and mid abdominal tenderness which is moderate without rebound or guarding, nondistended. Bowel sounds active throughout.  No hepatosplenomegaly. Rectal: No masses, enlarged prostate, dark brown stool in the vault which is trace heme positive, nontender exam Extremities: no clubbing, cyanosis, or edema Neurological: Alert and oriented to person place and time. Skin: Skin is warm and dry.  Psychiatric: Normal mood and affect. Behavior is normal.  RELEVANT LABS AND IMAGING: CBC    Component Value Date/Time   WBC 10.2 02/05/2015 1247   RBC 4.26 02/05/2015 1247   HGB 12.4 (L) 02/05/2015 1247   HCT 38.0 (L) 02/05/2015 1247   PLT 240 02/05/2015 1247   MCV 89.2 02/05/2015 1247   MCH 29.1 02/05/2015 1247   MCHC 32.6 02/05/2015 1247   RDW 14.2 02/05/2015 1247   LYMPHSABS 1.2 09/10/2013 0209   MONOABS 0.6 09/10/2013 0209   EOSABS 0.2 09/10/2013 0209   BASOSABS 0.0 09/10/2013 0209    CMP     Component Value Date/Time   NA 137 05/15/2018 0858   K 3.9 05/15/2018 0858   CL 103 05/15/2018 0858   CO2 29 05/15/2018 0858   GLUCOSE 95 05/15/2018 0858   BUN 19 05/15/2018 0858   CREATININE 1.04 05/15/2018 0858   CREATININE 0.9 01/09/2015 1122   CALCIUM 9.0 05/15/2018 0858   PROT 7.1 05/15/2018 0858   ALBUMIN 3.9 05/15/2018 0858   AST 14 05/15/2018 0858   ALT 10 05/15/2018 0858   ALKPHOS 61 05/15/2018 0858   BILITOT 0.3 05/15/2018 0858   GFRNONAA >60 04/14/2018 1030   GFRAA >60 04/14/2018 1030    ASSESSMENT/PLAN: 78 year old male with a history of CAD status post PCI  on Plavix, history of peripheral vascular disease status post carotid endarterectomy bilaterally, history of CHF, colonic diverticulosis, hypertension, hyperlipidemia, distal hyperplastic colon polyps who is seen to evaluate dark stool.  1.  Abdominal pain/dark heme positive stool/weight loss --constellation of symptoms are concerning.  I would like to rule out a malignancy and other source of GI bleeding.  I recommended the following --CBC, iron studies and CMP today --CT scan of the abdomen pelvis with contrast --Follow-up echocardiogram results tomorrow and based on EF will determine where his procedures can be done --Upper endoscopy and colonoscopy recommended to evaluate abdominal pain, heme positive stool, weight loss, nausea --Continue Pepcid 20 mg twice daily for now --We will need to contact Dr. Aundra Dubin for permission to hold Plavix 5 days before procedure  Hold Plavix 5 days before procedure - will instruct when and how to resume after procedure. Risks and benefits of procedure including bleeding, perforation, infection, missed lesions, medication reactions and possible hospitalization or surgery if complications occur explained. Additional rare but real risk of cardiovascular event such as heart attack or ischemia/infarct of other organs off Plavix explained and need to seek urgent help if this occurs. Will communicate by phone or EMR with patient's prescribing provider that to confirm holding Plavix is reasonable in this case.     FV:[id number], [place], Md [address] [place], [place] [address]

## 2018-07-25 NOTE — Telephone Encounter (Signed)
Request for surgical clearance:     Endoscopy Procedure  What type of surgery is being performed?     Endoscopy/colonoscopy  When is this surgery scheduled?     TBD  What type of clearance is required ?   Pharmacy  Are there any medications that need to be held prior to surgery and how long? Plavix, 5 days  Practice name and name of physician performing surgery?      [facility]  What is your office phone and fax number?      Phone- [phone]  [phone]  Anesthesia type (None, local, MAC, general) ?       MAC

## 2018-07-25 NOTE — Patient Instructions (Signed)
Your provider has requested that you go to the basement level for lab work before leaving today. Press "B" on the elevator. The lab is located at the first door on the left as you exit the elevator.  You have been scheduled for a CT scan of the abdomen and pelvis at [facility] ([address]---this is in the same building as Press [facility]).   You are scheduled on Friday 07/28/18 at 1:30 pm. You should arrive 15 minutes prior to your appointment time for registration. Please follow the written instructions below on the day of your exam:  WARNING: IF YOU ARE ALLERGIC TO IODINE/X-RAY DYE, PLEASE NOTIFY RADIOLOGY IMMEDIATELY AT [phone]! YOU WILL BE GIVEN A 13 HOUR PREMEDICATION PREP.  1) Do not eat or drink anything after 9:30 am (4 hours prior to your test) 2) You have been given 2 bottles of oral contrast to drink. The solution may taste better if refrigerated, but do NOT add ice or any other liquid to this solution. Shake well before drinking.    Drink 1 bottle of contrast @ 11:30 am (2 hours prior to your exam)  Drink 1 bottle of contrast @ 12:30 pm (1 hour prior to your exam)  You may take any medications as prescribed with a small amount of water, if necessary. If you take any of the following medications: METFORMIN, GLUCOPHAGE, GLUCOVANCE, AVANDAMET, RIOMET, FORTAMET, [place] MET, JANUMET, GLUMETZA or METAGLIP, you MAY be asked to HOLD this medication 48 hours AFTER the exam.  The purpose of you drinking the oral contrast is to aid in the visualization of your intestinal tract. The contrast solution may cause some diarrhea. Depending on your individual set of symptoms, you may also receive an intravenous injection of x-ray contrast/dye. Plan on being at [facility] for 30 minutes or longer, depending on the type of exam you are having performed.  This test typically takes 30-45 minutes to complete.  If you have any questions regarding your exam or if you need  to reschedule, you may call the CT department at [phone] between the hours of 8:00 am and 5:00 pm, Monday-Friday.  ________________________________________________________________________

## 2018-07-26 ENCOUNTER — Ambulatory Visit (HOSPITAL_COMMUNITY): Payer: Medicare Other | Attending: Cardiovascular Disease

## 2018-07-26 DIAGNOSIS — I5022 Chronic systolic (congestive) heart failure: Secondary | ICD-10-CM | POA: Insufficient documentation

## 2018-07-26 NOTE — Telephone Encounter (Signed)
He can hold Plavix as requested.  In general, you can be comfortable making that call on a stable patient who has not had recent PCI.

## 2018-07-26 NOTE — Telephone Encounter (Signed)
Dr, Aundra Dubin Please comment on holding Plavix for colonoscopy/endoscopy.

## 2018-07-27 ENCOUNTER — Telehealth: Payer: Self-pay | Admitting: *Deleted

## 2018-07-27 ENCOUNTER — Other Ambulatory Visit: Payer: Self-pay | Admitting: Internal Medicine

## 2018-07-27 ENCOUNTER — Ambulatory Visit: Payer: Medicare Other | Admitting: Internal Medicine

## 2018-07-27 ENCOUNTER — Other Ambulatory Visit: Payer: Medicare Other

## 2018-07-27 DIAGNOSIS — R059 Cough, unspecified: Secondary | ICD-10-CM

## 2018-07-27 DIAGNOSIS — I509 Heart failure, unspecified: Secondary | ICD-10-CM

## 2018-07-27 DIAGNOSIS — R6883 Chills (without fever): Secondary | ICD-10-CM | POA: Diagnosis not present

## 2018-07-27 DIAGNOSIS — R0600 Dyspnea, unspecified: Secondary | ICD-10-CM

## 2018-07-27 DIAGNOSIS — R05 Cough: Secondary | ICD-10-CM

## 2018-07-27 NOTE — Progress Notes (Signed)
I spoke with patient and advised that due to his new symptoms he will require Covid screen via lab piror to CT.  He is asked to proceed to [address] at 9:24 for testing.  He is advised that the CT will be rescheduled based on the results of the Covid test.  He verbalized understanding of the process.

## 2018-07-27 NOTE — Telephone Encounter (Signed)
Dr. Aundra Dubin, with new changes on echo, is pt OK for endoscopy/colonoscopy or would you prefer to see him in the office first since you have requested him to have an office visit to further evaluate echo results.   Please route response back to P CV DIV PREOP

## 2018-07-27 NOTE — Telephone Encounter (Signed)
COVID-19 Pre-Screening Questions:   In the past 7 to 10 days have you had a cough, shortness of breath, headache, congestion, fever, body aches, chills, sore throat, or sudden loss of taste or sense of smell? Yes SOB, Coughing and chills  Have you been around anyone with known Covid 19. No  Have you been around anyone who is awaiting Covid 19 test results in the past 7 to 10 days? No  Have you been around anyone who has been exposed to Covid 19, or has mentioned symptoms of Covid 19 within the past 7 to 10 days? No If you have any concerns about symptoms your patients report please contact your leadership team, or the provider the patient is seeing in the office for further guidance.   Pt will need to reschedule per Dr Hilarie Fredrickson

## 2018-07-27 NOTE — Telephone Encounter (Signed)
Called patient to ask prescreening questions no answer LMTCB prior to appt for CT.

## 2018-07-27 NOTE — Progress Notes (Signed)
Contacted by radiology because I ordered the patient a CT scan of the abdomen and pelvis Patient told radiology that he had cough, dyspnea and chills In order to proceed with CT scan the patient needs to have COVID-19 excluded per hour health system protocol He is age 78, history of CHF with symptoms I have ordered COVID-19 screen and will instruct patient to go to [place] for testing

## 2018-07-28 ENCOUNTER — Inpatient Hospital Stay: Admission: RE | Admit: 2018-07-28 | Payer: Medicare Other | Source: Ambulatory Visit

## 2018-07-28 NOTE — Telephone Encounter (Signed)
   Primary Cardiologist: [name], MD  Chart reviewed as part of pre-operative protocol coverage.  An echocardiogram was recently done that showed some new changes and Dr. Aundra Dubin would like to see the patient in his office to further evaluate this prior to having the requested procedure. At this time he is not cleared for the procedure.   Dr. Aundra Dubin will address clearance once pt evaluated.    Daune Perch, NP 07/28/2018, 8:32 AM

## 2018-07-28 NOTE — Telephone Encounter (Signed)
Can be seen in office by me first then scopes after.

## 2018-07-31 ENCOUNTER — Other Ambulatory Visit (INDEPENDENT_AMBULATORY_CARE_PROVIDER_SITE_OTHER): Payer: Self-pay

## 2018-07-31 ENCOUNTER — Other Ambulatory Visit: Payer: Self-pay

## 2018-07-31 ENCOUNTER — Ambulatory Visit (INDEPENDENT_AMBULATORY_CARE_PROVIDER_SITE_OTHER): Payer: Medicare Other | Admitting: Orthopaedic Surgery

## 2018-07-31 ENCOUNTER — Telehealth: Payer: Self-pay | Admitting: *Deleted

## 2018-07-31 ENCOUNTER — Telehealth: Payer: Self-pay

## 2018-07-31 ENCOUNTER — Telehealth: Payer: Self-pay | Admitting: Internal Medicine

## 2018-07-31 ENCOUNTER — Encounter: Payer: Self-pay | Admitting: Orthopaedic Surgery

## 2018-07-31 DIAGNOSIS — M5441 Lumbago with sciatica, right side: Secondary | ICD-10-CM

## 2018-07-31 DIAGNOSIS — M4807 Spinal stenosis, lumbosacral region: Secondary | ICD-10-CM

## 2018-07-31 LAB — NOVEL CORONAVIRUS, NAA: SARS-CoV-2, NAA: NOT DETECTED

## 2018-07-31 MED ORDER — HYDROCODONE-ACETAMINOPHEN 5-325 MG PO TABS: 1.0000 | ORAL_TABLET | ORAL | 0 refills | Status: AC | PRN

## 2018-07-31 NOTE — Progress Notes (Signed)
Office Visit Note   Patient: Erik [name]           Date of Birth: [birth year]           MRN: [id number] Visit Date: 07/31/2018              Requested by: Erik Sanders, MD [place], Erik [name] [id number] PCP: Erik Sanders, MD   Assessment & Plan: Visit Diagnoses:  1. Acute bilateral low back pain with right-sided sciatica     Plan: Due to the fact that patient's radicular symptoms down the right leg are becoming more symptomatic and the fact that he is failed conservative treatment recommend MRI.  We will obtain an MRI of his lumbar spine to rule out HNP as the source of his radicular pain down the right leg.  He is claustrophobic therefore we will order an open MRI.  He is given Norco which he is to use sparingly for pain.  Follow-Up Instructions: Return After MRI.   Orders:  No orders of the defined types were placed in this encounter.  Meds ordered this encounter  Medications  . HYDROcodone-acetaminophen (NORCO/VICODIN) 5-325 MG tablet    Sig: Take 1-2 tablets by mouth every 4 (four) hours as needed for up to 5 days for moderate pain.    Dispense:  25 tablet    Refill:  0      Procedures: No procedures performed   Clinical Data: No additional findings.   Subjective: Chief Complaint  Patient presents with  . Lower Back - Follow-up  . Right Hip - Follow-up    HPI Erik [name] 78 year old male returns today for follow-up of his low back pain.  He states that the Medrol dose pack, tramadol and Robaxin really did not help with his pain.  He is now having paresthesia-like symptoms in the right lower leg.  He continues to have radicular pain down the right leg mainly to the knee.  He is having no bowel bladder dysfunction.  However he is to see his gastroenterologist as he is having black tarry stools.  He states the pain does awaken him at night and keeps him from sleeping.  Pain is worse whenever he is lying down.  Review of Systems No recent  fevers chills.  Please see HPI otherwise negative  Objective: Vital Signs: There were no vitals taken for this visit.  Physical Exam Constitutional:      Appearance: He is not ill-appearing or diaphoretic.  Pulmonary:     Effort: Pulmonary effort is normal.  Neurological:     Mental Status: He is alert and oriented to person, place, and time.  Psychiatric:        Mood and Affect: Mood normal.     Ortho Exam Lower extremities 5 out of 5 strength throughout lower extremities.  Positive straight leg raise on the right.  Subjective decreased sensation over the medial aspect of the right lower leg.  Dorsal pedal pulses are intact bilaterally. Specialty Comments:  No specialty comments available.  Imaging: No results found.   PMFS History: Patient Active Problem List   Diagnosis Date Noted  . Restless leg syndrome 05/30/2018  . Abdominal aortic aneurysm (AAA) ([place]) 07/07/2015  . Arteriosclerosis of coronary artery 07/07/2015  . Chronic obstructive pulmonary disease ([place]) 07/07/2015  . Cardiomyopathy, ischemic 07/07/2015  . 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  . Solitary pulmonary nodule 04/16/2014  . Counseling regarding end of life decision making 04/16/2014  .  HTN (hypertension) 10/03/2013  . CAD S/P percutaneous coronary angioplasty 09/19/2013  . Chronic systolic heart failure ([place]) 09/19/2013  . NSTEMI (non-ST elevated myocardial infarction) ([place]) 09/10/2013  . Family history of coronary artery disease in brother 09/10/2013  . ED (erectile dysfunction) 12/10/2011  . COPD, mild ([place]) 06/20/2008  . COLONIC POLYPS, ADENOMATOUS 06/05/2008  . BPH (benign prostatic hyperplasia) 06/05/2008  . Prediabetes 05/14/2008  . VENEREAL WART 05/07/2008  . ONYCHOMYCOSIS, TOENAILS 05/07/2008  . Hyperlipidemia 05/07/2008  . Bilateral carotid artery stenosis 05/07/2008  . INTERMITTENT CLAUDICATION 05/07/2008  . HEART MURMUR, HX OF 05/07/2008  . COLONIC POLYPS,  HX OF 05/07/2008   Past Medical History:  Diagnosis Date  . Adenomatous colon polyp   . Aortic aneurysm ([name])   . BPH (benign prostatic hypertrophy)   . Carotid artery stenosis    a. Bilateral CEA  . Cataract   . Chronic systolic heart failure (HCC)    a. EF 20-25%, mild LVH, mod HK, mid apicalanteroseptal myocardium, mild MR, LA mod dilated  . Collagen vascular disease ([place])   . COPD (chronic obstructive pulmonary disease) ([place])   . Coronary artery disease    a. LHC (08/2013): Lmain: short 30% distal, LAD: sml D1 & D2, 70% ostial D1, 95-99% LAD stenosis prox D2 LCx: sml/mod ramus subtot. occluded, 40% ostial set off lg OM1, 40% AV LCx after OM1, RCA: 90% prox (DES to RCA and prox LAD)  . Diverticulosis   . Heart murmur   . History of colonic polyps   . Hyperlipidemia   . Hyperplastic colon polyp   . Hypertension   . Ischemic cardiomyopathy     Family History  Problem Relation Age of Onset  . Heart disease Brother   . Hyperlipidemia Brother   . Hypertension Mother   . Cancer Father        unknown type; sounds GI  . Hyperlipidemia Brother   . Hyperlipidemia Brother   . Heart attack Brother   . Hyperlipidemia Brother   . Multiple sclerosis Brother   . Kidney disease Neg Hx   . Prostate cancer Neg Hx   . Colon cancer Neg Hx     Past Surgical History:  Procedure Laterality Date  . cardiac stents  09-2013  . CAROTID ENDARTERECTOMY  04/17/2008   right  . CAROTID ENDARTERECTOMY  05/30/08   Left  . CHOLECYSTECTOMY     Gall Bladder  . CORONARY ANGIOGRAM  09/11/2013   Procedure: CORONARY ANGIOGRAM;  Surgeon: Troy Sine, MD;  Location: [facility] CATH LAB;  Service: Cardiovascular;;  . HERNIA REPAIR    . lower aorta aneurysm  05/26/15  . PERCUTANEOUS STENT INTERVENTION  09/11/2013   Procedure: PERCUTANEOUS STENT INTERVENTION;  Surgeon: Troy Sine, MD;  Location: [facility] CATH LAB;  Service: Cardiovascular;;  DES Prox RCA 3.5x15 xience    Social History   Occupational History  .  Occupation: Retired Development worker, community  Tobacco Use  . Smoking status: Current Every Day Smoker    Packs/day: 1.50    Years: 0.50    Pack years: 0.75    Types: Cigarettes    Last attempt to quit: 08/27/2013    Years since quitting: 4.9  . Smokeless tobacco: Former Systems developer    Quit date: 09/10/2013  Substance and Sexual Activity  . Alcohol use: No  . Drug use: No  . Sexual activity: Yes

## 2018-07-31 NOTE — Telephone Encounter (Signed)
Pt informed that he had taken the COVID-19 test on Thursday and would like to know how to proceed.

## 2018-07-31 NOTE — Telephone Encounter (Signed)
See below

## 2018-07-31 NOTE — Telephone Encounter (Signed)
[facility] [place] [facility] said that [place] prescribed Norco for patient today. They have codeine listed as an allergy on patients chart and patient reported to pharmacist that it makes his throat close up. Please call pharmacy to further advise. [phone]

## 2018-07-31 NOTE — Telephone Encounter (Signed)
Patient called stating that the medication prescribed for his restless leg syndrome Requip is not helping. Patient stated that his brother takes Pramipexole 0.5 mg and that helps him a lot. Patient wants to know if Dr. [name] may consider switching him over to the medication that helps his brother? [facility]acy [facility]

## 2018-07-31 NOTE — Telephone Encounter (Signed)
I called [place], pharmacist, and advised. Per conversation with Artis Delay, patient has taken percocet before with no problems.

## 2018-07-31 NOTE — Telephone Encounter (Signed)
Spoke with you about he fact he tolerates percocet

## 2018-07-31 NOTE — Telephone Encounter (Signed)
Patient advised that as soon as we have the results of the Covid test we will call back with a plan

## 2018-08-01 ENCOUNTER — Other Ambulatory Visit (HOSPITAL_COMMUNITY): Payer: Self-pay | Admitting: Cardiology

## 2018-08-01 ENCOUNTER — Encounter: Payer: Self-pay | Admitting: *Deleted

## 2018-08-01 MED ORDER — ROPINIROLE HCL 0.5 MG PO TABS: ORAL_TABLET | ORAL | 11 refills | Status: DC

## 2018-08-01 NOTE — Telephone Encounter (Signed)
COVID-19 Testing has returned negative. I have spoken to [facility] in [place] and patient has been rescheduled to CT on 08/04/18 at 2:30 pm with a 2:15 pm arrival. He should be NPO 10:30 am. Drink 1 bottle contrast at 12:30 pm and the next bottle at 1:30 pm. Patient verbalizes understanding.

## 2018-08-01 NOTE — Telephone Encounter (Signed)
Mr. Morozov notified as instructed by telephone.  He is agreeable with titration up on the Requip. [facility] [place]  Titration instructions also sent to patient on MyChart.

## 2018-08-01 NOTE — Telephone Encounter (Signed)
Sent to scheduler to have patient scheduled for in clnic visit with MD for clearance.

## 2018-08-01 NOTE — Telephone Encounter (Signed)
Let pt know... he is just on the starting dose of requip even at 2 tabs daily of 0.25 mg   We can change over but would need to also taper up on the pramipexole from low dose to 0.5mg  ( starts lower than requip)   Instead I would suggest.. titrating up the requip since he already has it, if no SE.    Below is titration guideline.  If he is agreeable I will send in 0.5 mg tabs and he can increase to 1 mg at bedtime... if not effective as below titrate up. ( will put instructions on prescription)  Initial: 0.25 mg once daily 1 to 3 hours before bedtime. Dose may be increased after 2 days to 0.5 mg daily, and after 7 days to 1 mg daily. Dose may be further titrated upward in 0.5 mg increments every week until reaching a daily dose of 3 mg during week 6. Daily dose may be increased to a maximum of 4 mg beginning week 7.   let me know if agreeable of if still wants to change to pramipexole.

## 2018-08-03 ENCOUNTER — Telehealth: Payer: Self-pay | Admitting: *Deleted

## 2018-08-03 NOTE — Telephone Encounter (Signed)

## 2018-08-04 ENCOUNTER — Other Ambulatory Visit: Payer: Self-pay

## 2018-08-04 ENCOUNTER — Ambulatory Visit (INDEPENDENT_AMBULATORY_CARE_PROVIDER_SITE_OTHER)
Admission: RE | Admit: 2018-08-04 | Discharge: 2018-08-04 | Disposition: A | Discharge status: Home / Self Care | Payer: Medicare Other | Source: Ambulatory Visit | Attending: Internal Medicine | Admitting: Internal Medicine

## 2018-08-04 DIAGNOSIS — R634 Abnormal weight loss: Secondary | ICD-10-CM

## 2018-08-04 DIAGNOSIS — R1084 Generalized abdominal pain: Secondary | ICD-10-CM | POA: Diagnosis not present

## 2018-08-04 DIAGNOSIS — K921 Melena: Secondary | ICD-10-CM | POA: Diagnosis not present

## 2018-08-04 DIAGNOSIS — R1032 Left lower quadrant pain: Secondary | ICD-10-CM | POA: Diagnosis not present

## 2018-08-04 MED ORDER — IOHEXOL 300 MG/ML  SOLN
100.0000 mL | Freq: Once | INTRAMUSCULAR | Status: AC | PRN
  Administered 2018-08-04: 100 mL via INTRAVENOUS

## 2018-08-08 ENCOUNTER — Telehealth: Payer: Self-pay | Admitting: *Deleted

## 2018-08-08 NOTE — Telephone Encounter (Signed)
ERROR

## 2018-08-08 NOTE — Telephone Encounter (Signed)
Patient has been advised that CT abd/pelvist did not show any acute findings to explain heme positive stool, weight loss and abdominal pain. Also advised that he will need endoscopy and colonoscopy for further evaluation but that we need clearance from cardiac standpoint as well as plavix clearance. We are currently awaiting both. He verbalizes understanding.

## 2018-08-08 NOTE — Telephone Encounter (Signed)
Pt has had change in cardiac status and needs appt, we are working on it. Thanks.

## 2018-08-08 NOTE — Telephone Encounter (Signed)
Please have pt seen by Dr. Aundra Dubin so he can decide on procedure- per Dr. Aundra Dubin.

## 2018-08-08 NOTE — Telephone Encounter (Signed)
Any idea when this patient will be scheduled? We really need to get him in for endo/colon but need cardiac clearance and plavix clearance before being allowed to do that... Thank you guys for your help!

## 2018-08-08 NOTE — Telephone Encounter (Signed)
-----   Message from Jerene Bears, MD sent at 08/08/2018  9:43 AM EDT ----- CT abd/pelvis does not show acute findings to explain dark heme + stools, weight loss and abd pain Would proceed with EGD and colonoscopy next available Will need permission to hold Plavix for these procedures

## 2018-08-08 NOTE — Telephone Encounter (Signed)
Spoke with Nira Conn at [facility], and she will call pt to get him scheduled for an in office visit with Dr. Aundra Dubin.

## 2018-08-09 NOTE — Telephone Encounter (Signed)
Patient is scheduled to see Dr Aundra Dubin on 08/11/18 for clearance. We will await his response.

## 2018-08-10 ENCOUNTER — Telehealth (HOSPITAL_COMMUNITY): Payer: Self-pay

## 2018-08-10 NOTE — Telephone Encounter (Signed)

## 2018-08-11 ENCOUNTER — Ambulatory Visit (HOSPITAL_COMMUNITY)
Admission: RE | Admit: 2018-08-11 | Discharge: 2018-08-11 | Disposition: A | Discharge status: Home / Self Care | Payer: Medicare Other | Source: Ambulatory Visit | Attending: Cardiology | Admitting: Cardiology

## 2018-08-11 ENCOUNTER — Ambulatory Visit
Admission: RE | Admit: 2018-08-11 | Discharge: 2018-08-11 | Disposition: A | Discharge status: Home / Self Care | Payer: Medicare Other | Source: Ambulatory Visit | Attending: Physician Assistant | Admitting: Physician Assistant

## 2018-08-11 ENCOUNTER — Encounter (HOSPITAL_COMMUNITY): Payer: Self-pay | Admitting: Cardiology

## 2018-08-11 ENCOUNTER — Other Ambulatory Visit: Payer: Self-pay

## 2018-08-11 VITALS — BP 154/62 | HR 90 | Wt 136.6 lb

## 2018-08-11 DIAGNOSIS — J449 Chronic obstructive pulmonary disease, unspecified: Secondary | ICD-10-CM | POA: Diagnosis not present

## 2018-08-11 DIAGNOSIS — Z8249 Family history of ischemic heart disease and other diseases of the circulatory system: Secondary | ICD-10-CM | POA: Insufficient documentation

## 2018-08-11 DIAGNOSIS — I255 Ischemic cardiomyopathy: Secondary | ICD-10-CM | POA: Diagnosis not present

## 2018-08-11 DIAGNOSIS — Z7982 Long term (current) use of aspirin: Secondary | ICD-10-CM | POA: Insufficient documentation

## 2018-08-11 DIAGNOSIS — Z79899 Other long term (current) drug therapy: Secondary | ICD-10-CM | POA: Diagnosis not present

## 2018-08-11 DIAGNOSIS — F17210 Nicotine dependence, cigarettes, uncomplicated: Secondary | ICD-10-CM | POA: Insufficient documentation

## 2018-08-11 DIAGNOSIS — I5022 Chronic systolic (congestive) heart failure: Secondary | ICD-10-CM | POA: Insufficient documentation

## 2018-08-11 DIAGNOSIS — M5430 Sciatica, unspecified side: Secondary | ICD-10-CM | POA: Insufficient documentation

## 2018-08-11 DIAGNOSIS — E785 Hyperlipidemia, unspecified: Secondary | ICD-10-CM | POA: Insufficient documentation

## 2018-08-11 DIAGNOSIS — N400 Enlarged prostate without lower urinary tract symptoms: Secondary | ICD-10-CM | POA: Insufficient documentation

## 2018-08-11 DIAGNOSIS — I2510 Atherosclerotic heart disease of native coronary artery without angina pectoris: Secondary | ICD-10-CM | POA: Diagnosis not present

## 2018-08-11 DIAGNOSIS — I110 Hypertensive heart disease with heart failure: Secondary | ICD-10-CM | POA: Insufficient documentation

## 2018-08-11 DIAGNOSIS — M4807 Spinal stenosis, lumbosacral region: Secondary | ICD-10-CM

## 2018-08-11 DIAGNOSIS — Z7902 Long term (current) use of antithrombotics/antiplatelets: Secondary | ICD-10-CM | POA: Insufficient documentation

## 2018-08-11 DIAGNOSIS — Z955 Presence of coronary angioplasty implant and graft: Secondary | ICD-10-CM | POA: Diagnosis not present

## 2018-08-11 DIAGNOSIS — M48061 Spinal stenosis, lumbar region without neurogenic claudication: Secondary | ICD-10-CM | POA: Diagnosis not present

## 2018-08-11 LAB — CBC
HCT: 36.5 % — ABNORMAL LOW (ref 39.0–52.0)
Hemoglobin: 11.6 g/dL — ABNORMAL LOW (ref 13.0–17.0)
MCH: 27.3 pg (ref 26.0–34.0)
MCHC: 31.8 g/dL (ref 30.0–36.0)
MCV: 85.9 fL (ref 80.0–100.0)
Platelets: 340 10*3/uL (ref 150–400)
RBC: 4.25 MIL/uL (ref 4.22–5.81)
RDW: 16.9 % — ABNORMAL HIGH (ref 11.5–15.5)
WBC: 7.7 10*3/uL (ref 4.0–10.5)
nRBC: 0 % (ref 0.0–0.2)

## 2018-08-11 MED ORDER — LISINOPRIL 5 MG PO TABS: 5.0000 mg | ORAL_TABLET | Freq: Every day | ORAL | 2 refills | Status: DC

## 2018-08-11 NOTE — Patient Instructions (Signed)
INCREASE Lisinopril to 5 mg, one tab daily  Labs today We will only contact you if something comes back abnormal or we need to make some changes. Otherwise no news is good news! -Labs needed in one week  Your physician has requested that you have a lexiscan myoview. For further information please visit [email or web address]. Please follow instruction sheet, as given.  Your physician recommends that you schedule a follow-up appointment in: 6 months with Dr Aundra Dubin  Do the following things EVERYDAY: 1) Weigh yourself in the morning before breakfast. Write it down and keep it in a log. 2) Take your medicines as prescribed 3) Eat low salt foods-Limit salt (sodium) to 2000 mg per day.  4) Stay as active as you can everyday 5) Limit all fluids for the day to less than 2 liters

## 2018-08-12 ENCOUNTER — Other Ambulatory Visit: Payer: Self-pay | Admitting: Family Medicine

## 2018-08-12 NOTE — Progress Notes (Signed)
Patient ID: Erik [name], male   DOB: October 19, 1940, 78 y.o.   MRN: [id number] PCP: Dr [name] Vascular: Dr Scot Dock Cardiology: Dr Aundra Dubin   HPI: Mr Erik [name] is a 78 y.o. with a history of COPD, HTN , PAD with bilateral CEAs, ICM, CAD s/p PCI of proximal LAD and proximal RCA (08/628), chronic systolic HF and prior tobacco abuse. He follows at [name] - Dr. Sammuel [name]. 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 Also had left renal artery stenting.   Admitted with dyspnea in 08/2013. Found to have 2v CAD and EF 20-25%, which was a new finding. He wore a Lifevest. EF recovered on 05/2015 echo to 55-60%.   Echo was done in 5/20, showing EF 45-50% with septal hypokinesis, mild LVH, moderate AI.   He returns today for followup of CHF and CAD.  Recently, he has had dark stool and abdominal pain.  CT abdomen/pelvis showed diverticulosis, no other major findings.  Stool was heme+.  He will need EGD and colonoscopy.  He also has been having limiting sciatica and will be getting an MRI soon.  From a cardiac standpoint, symptoms have been stable.  He is short of breath walking up a flight of stairs (mildly) and if he walks too fast.  No chest pain. No particular fatigue. BP is high today but runs 110s/50s at home. No claudication. No orthopnea/PND. Weight down 7 lbs.   Labs 03/2016, K 3.8, creatinine 1.00 Labs 09/13/13 K 4.1 Creatinine 0.95  Labs 10/15/13 K 4.1 Cr 0.7 => 1.07 Labs 2/19: K 4.2, creatinine 0.98, LDL 49, HDL 43 Labs 5/20: K 4.2, creatinine 0.99, LDL 44, hgb 12.3  ROS: All systems negative except as listed in HPI, PMH and Problem List.  Family Status  Relation Name Status  . Brother  Alive  . Mother  Alive       HTN, Born 18  . Father  Deceased       brain tumor  . Brother  Alive       HLD, Heart disease  . Brother  Alive       HLD, CAD  . Brother  Alive       HLD, MS  . Neg Hx  (Not Specified)    Social History   Socioeconomic History   . Marital status: Married    Spouse name: Not on file  . Number of children: Not on file  . Years of education: Not on file  . Highest education level: Not on file  Occupational History  . Occupation: Retired Research officer, trade union  . Financial resource strain: Not on file  . Food insecurity:    Worry: Not on file    Inability: Not on file  . Transportation needs:    Medical: Not on file    Non-medical: Not on file  Tobacco Use  . Smoking status: Current Every Day Smoker    Packs/day: 1.50    Years: 0.50    Pack years: 0.75    Types: Cigarettes    Last attempt to quit: 08/27/2013    Years since quitting: 4.9  . Smokeless tobacco: Former Systems developer    Quit date: 09/10/2013  Substance and Sexual Activity  . Alcohol use: No  . Drug use: No  . Sexual activity: Yes  Lifestyle  . Physical activity:    Days per week: Not on file    Minutes per session: Not on file  . Stress: Not  on file  Relationships  . Social connections:    Talks on phone: Not on file    Gets together: Not on file    Attends religious service: Not on file    Active member of club or organization: Not on file    Attends meetings of clubs or organizations: Not on file    Relationship status: Not on file  . Intimate partner violence:    Fear of current or ex partner: Not on file    Emotionally abused: Not on file    Physically abused: Not on file    Forced sexual activity: Not on file  Other Topics Concern  . Not on file  Social History Narrative   Lives with wife in [place]. Retired from the post office    Past Medical History:  Diagnosis Date  . Adenomatous colon polyp   . Aortic aneurysm ([place])   . BPH (benign prostatic hypertrophy)   . Carotid artery stenosis    a. Bilateral CEA  . Cataract   . Chronic systolic heart failure (HCC)    a. EF 20-25%, mild LVH, mod HK, mid apicalanteroseptal myocardium, mild MR, LA mod dilated  . Collagen vascular disease ([place])   . COPD (chronic obstructive  pulmonary disease) ([place])   . Coronary artery disease    a. LHC (08/2013): Lmain: short 30% distal, LAD: sml D1 & D2, 70% ostial D1, 95-99% LAD stenosis prox D2 LCx: sml/mod ramus subtot. occluded, 40% ostial set off lg OM1, 40% AV LCx after OM1, RCA: 90% prox (DES to RCA and prox LAD)  . Diverticulosis   . Heart murmur   . History of colonic polyps   . Hyperlipidemia   . Hyperplastic colon polyp   . Hypertension   . Ischemic cardiomyopathy     Current Outpatient Medications  Medication Sig Dispense Refill  . acetaminophen (TYLENOL) 325 MG tablet Take 325 mg by mouth.    Marland Kitchen aspirin 81 MG tablet Take 81 mg by mouth daily.      . clopidogrel (PLAVIX) 75 MG tablet TAKE 1 TABLET BY MOUTH EVERY DAY 90 tablet 3  . ezetimibe-simvastatin (VYTORIN) 10-40 MG tablet TAKE 1 TABLET AT BEDTIME BY MOUTH. 90 tablet 3  . famotidine (PEPCID) 20 MG tablet TAKE 1 TABLET (20 MG TOTAL) BY MOUTH 2 (TWO) TIMES DAILY. 60 tablet 5  . ferrous sulfate 325 (65 FE) MG tablet Take 1 tablet (325 mg total) by mouth daily with breakfast. 30 tablet 3  . furosemide (LASIX) 20 MG tablet TAKE 1 TABLET BY MOUTH EVERY DAY 90 tablet 1  . lisinopril (ZESTRIL) 5 MG tablet Take 1 tablet (5 mg total) by mouth daily. 90 tablet 2  . metoprolol succinate (TOPROL-XL) 50 MG 24 hr tablet TAKE 1 TABLET (50 MG TOTAL) BY MOUTH DAILY. TAKE WITH OR IMMEDIATELY FOLLOWING A MEAL. 90 tablet 0  . nitroGLYCERIN (NITROSTAT) 0.4 MG SL tablet Place 1 tablet (0.4 mg total) under the tongue every 5 (five) minutes x 3 doses as needed for chest pain. 30 tablet 3  . omega-3 acid ethyl esters (LOVAZA) 1 g capsule Take by mouth. Reported on 06/05/2015    . rOPINIRole (REQUIP) 0.5 MG tablet 2 tabs daily, Dose may be further titrated upward in 0.5 mg increments every week until effective or reaching a daily dose of 3 mg. 60 tablet 11  . tamsulosin (FLOMAX) 0.4 MG CAPS capsule TAKE 1 CAPSULE (0.4 MG TOTAL) BY MOUTH DAILY. 90 capsule 3   No current  facility-administered medications for this encounter.     Vitals:   08/11/18 0859  BP: (!) 154/62  Pulse: 90  SpO2: 97%  Weight: 62 kg (136 lb 9.6 oz)     PHYSICAL EXAM: General: NAD Neck: No JVD, no thyromegaly or thyroid nodule.  Lungs: Clear to auscultation bilaterally with normal respiratory effort. CV: Nondisplaced PMI.  Heart regular S1/S2, no S3/S4, no murmur.  No peripheral edema.  No carotid bruit.  Normal pedal pulses.  Abdomen: Soft, nontender, no hepatosplenomegaly, no distention.  Skin: Intact without lesions or rashes.  Neurologic: Alert and oriented x 3.  Psych: Normal affect. Extremities: No clubbing or cyanosis.  HEENT: Normal.   ASSESSMENT & PLAN:  1. Chronic Systolic Heart Failure: Ischemic cardiomyopathy, EF 20-25% (08/2013), EF recovered on 2017 echo with EF 55-60%.  Most recent echo in 5/20 with EF 45-50%, septal hypokinesis, moderate AI.  NYHA class II symptoms. He is not volume overloaded on exam.  - Continue Lasix 20mg  daily.  - With elevated BP, will increase lisinopril to 5 mg daily with BMET in 1 week.   - Continue Toprol XL 50 mg daily.  - With apparent mild fall in EF, will arrange for Lexiscan Cardiolite next week.   2. CAD: LHC 09/11/13 with 2 vessel disease that required PCI of proximal LAD and proximal RCA.  No chest pain but EF is lower than in the past (45-50% on 5/20 echo, EF 55-60% in 2017).  - Continue Plavix + ASA. Will be on Plavix indefinitely.  - Continue Vytorin, good lipids in 5/20.  - As above, will arrange for Lexiscan Cardiolite.   3. COPD: No longer smoking, normal spirometry in 2014.  4. H/O of bilateral CEAs: Followed by [facility]. - Continue ASA and Plavix.  - Needs appointment with [facility].  5. History of aortobiiliac endograft with Dr. Sammuel [name] at [facility]. Follows for PAD with [name] and [facility].  - Needs appt with [facility].  6. Hyperlipidemia: Good lipids in 5/20 on Vytorin.  7. Suspected GI bleeding: Patient needs upper and lower endoscopy.   Unless he has high risk findings on Cardiolite, this should be reasonable to undertake.  If not high risk Cardiolite, he can hold Plavix 5 days prior to procedures.  8. Sciatica: Significant limitation.  He will be getting an MRI soon.  If surgery is deemed to be necessary, he should be of reasonable risk to undertake the procedure as long as Cardiolite is not high risk.    Follow up in 6 months   [name] 08/12/2018

## 2018-08-15 ENCOUNTER — Other Ambulatory Visit: Payer: Self-pay

## 2018-08-15 ENCOUNTER — Encounter: Payer: Self-pay | Admitting: Orthopaedic Surgery

## 2018-08-15 ENCOUNTER — Ambulatory Visit (INDEPENDENT_AMBULATORY_CARE_PROVIDER_SITE_OTHER): Payer: Medicare Other | Admitting: Orthopaedic Surgery

## 2018-08-15 DIAGNOSIS — M5441 Lumbago with sciatica, right side: Secondary | ICD-10-CM | POA: Diagnosis not present

## 2018-08-15 DIAGNOSIS — I2510 Atherosclerotic heart disease of native coronary artery without angina pectoris: Secondary | ICD-10-CM

## 2018-08-15 NOTE — Progress Notes (Signed)
Office Visit Note   Patient: Erik [name]           Date of Birth: 10/18/1940           MRN: [id number] Visit Date: 08/15/2018              Requested by: [name], MD [address], [place] [id number] PCP: [name], MD   Assessment & Plan: Visit Diagnoses:  1. Right-sided low back pain with right-sided sciatica, unspecified chronicity     Plan: We will have patient set up for an epidural steroid injection with Dr. Ernestina Patches of the lumbar spine L4-5.  Patient is on Plavix.  We will have Dr. Romona Curls staff discuss any discontinuation of his anticoagulant with him.  Questions encouraged and answered at length.  We will see him back 1 month after the epidural steroid injection.  See what type of relief he had to this.  Follow-Up Instructions: Return for 1 month after epidural steroid injection.   Orders:  No orders of the defined types were placed in this encounter.  No orders of the defined types were placed in this encounter.     Procedures: No procedures performed   Clinical Data: No additional findings.   Subjective: Chief Complaint  Patient presents with  . Lower Back - Follow-up    HPI  [name] returns today to go over the MRI lumbar spine.  He continues to have numbness medial aspect of the right lower leg to the right ankle does not go into the foot.  He does have achy-like pain in the right thigh.  Feels overall that his back pain and radicular symptoms have slightly improved with time. MRI dated 08/11/2018 is reviewed with the patient actual images are reviewed.  This shows a disc protrusion at L4-5 that extends into the foramina.  This appears to compress the right L4 nerve.  Also narrowing of the right lateral recess is suspected to affect the right L5 nerve root.  L5-S1 with disc degeneration but no central canal stenosis or foraminal nerve compression.  Review of Systems No fevers or chills.  Objective: Vital Signs: There were  no vitals taken for this visit.  Physical Exam Constitutional:      Appearance: He is not ill-appearing or diaphoretic.  Pulmonary:     Effort: Pulmonary effort is normal.  Neurological:     Mental Status: He is alert and oriented to person, place, and time.  Psychiatric:        Mood and Affect: Mood normal.     Ortho Exam Negative straight leg raise bilaterally.  Exquisitely tight hamstrings bilaterally.  He is unable to flex and touch his toes lacking about 6 inches.  Is increased discomfort with flexion.  He has limited extension but no discomfort in low back. Specialty Comments:  No specialty comments available.  Imaging: No results found.   PMFS History: Patient Active Problem List   Diagnosis Date Noted  . Restless leg syndrome 05/30/2018  . Abdominal aortic aneurysm (AAA) ([place]) 07/07/2015  . Arteriosclerosis of coronary artery 07/07/2015  . Chronic obstructive pulmonary disease ([place]) 07/07/2015  . Cardiomyopathy, ischemic 07/07/2015  . 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  . Solitary pulmonary nodule 04/16/2014  . Counseling regarding end of life decision making 04/16/2014  . HTN (hypertension) 10/03/2013  . CAD S/P percutaneous coronary angioplasty 09/19/2013  . Chronic systolic heart failure ([place]) 09/19/2013  . NSTEMI (non-ST elevated myocardial infarction) ([place])  09/10/2013  . Family history of coronary artery disease in brother 09/10/2013  . ED (erectile dysfunction) 12/10/2011  . COPD, mild ([place]) 06/20/2008  . COLONIC POLYPS, ADENOMATOUS 06/05/2008  . BPH (benign prostatic hyperplasia) 06/05/2008  . Prediabetes 05/14/2008  . VENEREAL WART 05/07/2008  . ONYCHOMYCOSIS, TOENAILS 05/07/2008  . Hyperlipidemia 05/07/2008  . Bilateral carotid artery stenosis 05/07/2008  . INTERMITTENT CLAUDICATION 05/07/2008  . HEART MURMUR, HX OF 05/07/2008  . COLONIC POLYPS, HX OF 05/07/2008   Past Medical History:  Diagnosis Date  . Adenomatous  colon polyp   . Aortic aneurysm ([place])   . BPH (benign prostatic hypertrophy)   . Carotid artery stenosis    a. Bilateral CEA  . Cataract   . Chronic systolic heart failure (HCC)    a. EF 20-25%, mild LVH, mod HK, mid apicalanteroseptal myocardium, mild MR, LA mod dilated  . Collagen vascular disease ([place])   . COPD (chronic obstructive pulmonary disease) ([place])   . Coronary artery disease    a. LHC (08/2013): Lmain: short 30% distal, LAD: sml D1 & D2, 70% ostial D1, 95-99% LAD stenosis prox D2 LCx: sml/mod ramus subtot. occluded, 40% ostial set off lg OM1, 40% AV LCx after OM1, RCA: 90% prox (DES to RCA and prox LAD)  . Diverticulosis   . Heart murmur   . History of colonic polyps   . Hyperlipidemia   . Hyperplastic colon polyp   . Hypertension   . Ischemic cardiomyopathy     Family History  Problem Relation Age of Onset  . Heart disease Brother   . Hyperlipidemia Brother   . Hypertension Mother   . Cancer Father        unknown type; sounds GI  . Hyperlipidemia Brother   . Hyperlipidemia Brother   . Heart attack Brother   . Hyperlipidemia Brother   . Multiple sclerosis Brother   . Kidney disease Neg Hx   . Prostate cancer Neg Hx   . Colon cancer Neg Hx     Past Surgical History:  Procedure Laterality Date  . cardiac stents  09-2013  . CAROTID ENDARTERECTOMY  04/17/2008   right  . CAROTID ENDARTERECTOMY  05/30/08   Left  . CHOLECYSTECTOMY     Gall Bladder  . CORONARY ANGIOGRAM  09/11/2013   Procedure: CORONARY ANGIOGRAM;  Surgeon: Troy Sine, MD;  Location: [facility] CATH LAB;  Service: Cardiovascular;;  . HERNIA REPAIR    . lower aorta aneurysm  05/26/15  . PERCUTANEOUS STENT INTERVENTION  09/11/2013   Procedure: PERCUTANEOUS STENT INTERVENTION;  Surgeon: Troy Sine, MD;  Location: [facility] CATH LAB;  Service: Cardiovascular;;  DES Prox RCA 3.5x15 xience    Social History   Occupational History  . Occupation: Retired Development worker, community  Tobacco Use  . Smoking status: Current Every  Day Smoker    Packs/day: 1.50    Years: 0.50    Pack years: 0.75    Types: Cigarettes    Last attempt to quit: 08/27/2013    Years since quitting: 4.9  . Smokeless tobacco: Former Systems developer    Quit date: 09/10/2013  Substance and Sexual Activity  . Alcohol use: No  . Drug use: No  . Sexual activity: Yes

## 2018-08-17 ENCOUNTER — Telehealth (HOSPITAL_COMMUNITY): Payer: Self-pay | Admitting: *Deleted

## 2018-08-17 ENCOUNTER — Other Ambulatory Visit: Payer: Self-pay | Admitting: Orthopaedic Surgery

## 2018-08-17 ENCOUNTER — Telehealth: Payer: Self-pay

## 2018-08-17 MED ORDER — ACETAMINOPHEN-CODEINE #3 300-30 MG PO TABS: 1.0000 | ORAL_TABLET | Freq: Three times a day (TID) | ORAL | 0 refills | Status: DC | PRN

## 2018-08-17 NOTE — Telephone Encounter (Signed)
Patient given message from below from Dr. Ninfa Linden He states he will try the Tylenol#3 that he called in

## 2018-08-17 NOTE — Telephone Encounter (Signed)
Please advise 

## 2018-08-17 NOTE — Telephone Encounter (Signed)
Patient states he is allergic to codeine, can't take this States he's had Tramadol and does no more than regular Tylenol

## 2018-08-17 NOTE — Telephone Encounter (Signed)
Close encounter 

## 2018-08-17 NOTE — Telephone Encounter (Signed)
I sent in some tylenol#3. 

## 2018-08-17 NOTE — Telephone Encounter (Signed)
[name] is calling he is in a lot of pain, feels like pain is radiating down his leg, he is asking for something for pain, please advise?

## 2018-08-17 NOTE — Telephone Encounter (Signed)
Will there is nothing really we can provide him then.  What he is reading off the bottle is actually codeine as well as the same hydrocodone with acetaminophen and it.

## 2018-08-18 ENCOUNTER — Ambulatory Visit (HOSPITAL_COMMUNITY)
Admission: RE | Admit: 2018-08-18 | Discharge: 2018-08-18 | Disposition: A | Discharge status: Home / Self Care | Payer: Medicare Other | Source: Ambulatory Visit | Attending: Cardiovascular Disease | Admitting: Cardiovascular Disease

## 2018-08-18 ENCOUNTER — Other Ambulatory Visit: Payer: Self-pay | Admitting: *Deleted

## 2018-08-18 ENCOUNTER — Other Ambulatory Visit: Payer: Self-pay

## 2018-08-18 DIAGNOSIS — I5022 Chronic systolic (congestive) heart failure: Secondary | ICD-10-CM

## 2018-08-18 LAB — BASIC METABOLIC PANEL
BUN/Creatinine Ratio: 21 (ref 10–24)
BUN: 19 mg/dL (ref 8–27)
CO2: 23 mmol/L (ref 20–29)
Calcium: 9 mg/dL (ref 8.6–10.2)
Chloride: 105 mmol/L (ref 96–106)
Creatinine, Ser: 0.9 mg/dL (ref 0.76–1.27)
GFR calc Af Amer: 95 mL/min/{1.73_m2} (ref >59)
GFR calc non Af Amer: 82 mL/min/{1.73_m2} (ref >59)
Glucose: 111 mg/dL — ABNORMAL HIGH (ref 65–99)
Potassium: 5.1 mmol/L (ref 3.5–5.2)
Sodium: 138 mmol/L (ref 134–144)

## 2018-08-18 LAB — MYOCARDIAL PERFUSION IMAGING
LV dias vol: 210 mL (ref 62–150)
LV sys vol: 126 mL
Peak HR: 86 {beats}/min
Rest HR: 64 {beats}/min
SDS: 2
SRS: 3
SSS: 5
TID: 1.15

## 2018-08-18 MED ORDER — TECHNETIUM TC 99M TETROFOSMIN IV KIT
10.1000 | PACK | Freq: Once | INTRAVENOUS | Status: AC | PRN
  Administered 2018-08-18: 10.1 via INTRAVENOUS
  Filled 2018-08-18: qty 11

## 2018-08-18 MED ORDER — AMINOPHYLLINE 25 MG/ML IV SOLN
75.0000 mg | Freq: Once | INTRAVENOUS | Status: AC
  Administered 2018-08-18: 75 mg via INTRAVENOUS

## 2018-08-18 MED ORDER — REGADENOSON 0.4 MG/5ML IV SOLN
0.4000 mg | Freq: Once | INTRAVENOUS | Status: AC
  Administered 2018-08-18: 0.4 mg via INTRAVENOUS

## 2018-08-18 MED ORDER — TECHNETIUM TC 99M TETROFOSMIN IV KIT
30.9000 | PACK | Freq: Once | INTRAVENOUS | Status: AC | PRN
  Administered 2018-08-18: 30.9 via INTRAVENOUS
  Filled 2018-08-18: qty 31

## 2018-08-22 NOTE — Telephone Encounter (Signed)
Now that patient has seen Dr Aundra Dubin and had further cardiac testing, may we have formal cardiac clearance for patient to undergo endoscopy/colonoscopy?

## 2018-08-22 NOTE — Telephone Encounter (Signed)
   Primary Cardiologist: [name], MD  Chart reviewed as part of pre-operative protocol coverage. Given past medical history and time since last visit, based on ACC/AHA guidelines, [name] would be at acceptable risk for the planned procedure without further cardiovascular testing. He had slight decrease in EF on echo 07/26/18 and follow up myocardial perfusion study was overall low risk. Per Dr. Aundra Dubin, pt can proceed with colonoscopy and OK to hold Plavix for 5 days for procedure.   I will route this recommendation to the requesting party via Epic fax function and remove from pre-op pool.  Please call with questions.  Daune Perch, NP 08/22/2018, 10:42 AM

## 2018-08-23 ENCOUNTER — Ambulatory Visit: Payer: Medicare Other | Admitting: Urology

## 2018-08-25 ENCOUNTER — Telehealth: Payer: Self-pay | Admitting: *Deleted

## 2018-08-28 ENCOUNTER — Other Ambulatory Visit: Payer: Self-pay | Admitting: Family Medicine

## 2018-08-28 ENCOUNTER — Other Ambulatory Visit: Payer: Self-pay | Admitting: Physical Medicine and Rehabilitation

## 2018-08-28 DIAGNOSIS — H2511 Age-related nuclear cataract, right eye: Secondary | ICD-10-CM | POA: Diagnosis not present

## 2018-08-28 DIAGNOSIS — F411 Generalized anxiety disorder: Secondary | ICD-10-CM

## 2018-08-28 DIAGNOSIS — I2510 Atherosclerotic heart disease of native coronary artery without angina pectoris: Secondary | ICD-10-CM | POA: Diagnosis not present

## 2018-08-28 MED ORDER — DIAZEPAM 5 MG PO TABS: ORAL_TABLET | ORAL | 0 refills | Status: DC

## 2018-08-28 NOTE — Progress Notes (Signed)
Pre-procedure diazepam ordered for pre-operative anxiety.  

## 2018-08-28 NOTE — Telephone Encounter (Signed)
Done

## 2018-08-29 ENCOUNTER — Encounter: Payer: Self-pay | Admitting: Anesthesiology

## 2018-08-29 ENCOUNTER — Encounter: Payer: Self-pay | Admitting: *Deleted

## 2018-08-29 ENCOUNTER — Other Ambulatory Visit: Payer: Self-pay

## 2018-08-31 ENCOUNTER — Encounter: Payer: Medicare Other | Admitting: Internal Medicine

## 2018-09-01 ENCOUNTER — Other Ambulatory Visit: Admission: RE | Admit: 2018-09-01 | Payer: Medicare Other | Source: Ambulatory Visit

## 2018-09-03 ENCOUNTER — Other Ambulatory Visit: Payer: Self-pay | Admitting: Urology

## 2018-09-04 NOTE — Telephone Encounter (Signed)
I have given [name] 90 days of tamsulosin.  He will need an office visit prior to refilling the medication again.

## 2018-09-05 ENCOUNTER — Ambulatory Visit: Admit: 2018-09-05 | Payer: Medicare Other | Admitting: Ophthalmology

## 2018-09-05 SURGERY — PHACOEMULSIFICATION, CATARACT, WITH IOL INSERTION
Anesthesia: Topical | Laterality: Right

## 2018-09-08 ENCOUNTER — Other Ambulatory Visit: Payer: Self-pay

## 2018-09-08 ENCOUNTER — Ambulatory Visit (AMBULATORY_SURGERY_CENTER): Payer: Self-pay

## 2018-09-08 VITALS — Ht 66.0 in | Wt 131.0 lb

## 2018-09-08 DIAGNOSIS — R634 Abnormal weight loss: Secondary | ICD-10-CM

## 2018-09-08 DIAGNOSIS — R195 Other fecal abnormalities: Secondary | ICD-10-CM

## 2018-09-08 NOTE — Progress Notes (Signed)
Denies allergies to eggs or soy products. Denies complication of anesthesia or sedation. Denies use of weight loss medication. Denies use of O2.   Emmi instructions given for colonoscopy.  Pre-Visit was conducted by phone due to Covid 19. Instructions were reviewed with patient. A sample of Plenvu was given to the patient. He will pick up prep and instructions on 09/11/18. Patient was encouraged to call us if he had any questions or concerns after reviewing instructions.

## 2018-09-11 ENCOUNTER — Ambulatory Visit (INDEPENDENT_AMBULATORY_CARE_PROVIDER_SITE_OTHER): Payer: Medicare Other | Admitting: Physical Medicine and Rehabilitation

## 2018-09-11 ENCOUNTER — Ambulatory Visit: Payer: Medicare Other

## 2018-09-11 ENCOUNTER — Encounter: Payer: Self-pay | Admitting: Physical Medicine and Rehabilitation

## 2018-09-11 ENCOUNTER — Other Ambulatory Visit: Payer: Self-pay

## 2018-09-11 VITALS — BP 145/60 | HR 70

## 2018-09-11 DIAGNOSIS — M5416 Radiculopathy, lumbar region: Secondary | ICD-10-CM

## 2018-09-11 MED ORDER — BETAMETHASONE SOD PHOS & ACET 6 (3-3) MG/ML IJ SUSP
12.0000 mg | Freq: Once | INTRAMUSCULAR | Status: AC
  Administered 2018-09-11: 12 mg

## 2018-09-11 NOTE — Procedures (Signed)
Lumbosacral Transforaminal Epidural Steroid Injection - Sub-Pedicular Approach with Fluoroscopic Guidance  Patient: Erik [name]      Date of Birth: [birth year] MRN: [id number] PCP: [name], MD      Visit Date: 09/11/2018   Universal Protocol:    Date/Time: 09/11/2018  Consent Given By: the patient  Position: PRONE  Additional Comments: Vital signs were monitored before and after the procedure. Patient was prepped and draped in the usual sterile fashion. The correct patient, procedure, and site was verified.   Injection Procedure Details:  Procedure Site One Meds Administered:  Meds ordered this encounter  Medications  . betamethasone acetate-betamethasone sodium phosphate (CELESTONE) injection 12 mg    Laterality: Right  Location/Site:  L4-L5  Needle size: 22 G  Needle type: Spinal  Needle Placement: Transforaminal  Findings:    -Comments: Excellent flow of contrast along the nerve and into the epidural space.  Procedure Details: After squaring off the end-plates to get a true AP view, the C-arm was positioned so that an oblique view of the foramen as noted above was visualized. The target area is just inferior to the "nose of the scotty dog" or sub pedicular. The soft tissues overlying this structure were infiltrated with 2-3 ml. of 1% Lidocaine without Epinephrine.  The spinal needle was inserted toward the target using a "trajectory" view along the fluoroscope beam.  Under AP and lateral visualization, the needle was advanced so it did not puncture dura and was located close the 6 O'Clock position of the pedical in AP tracterory. Biplanar projections were used to confirm position. Aspiration was confirmed to be negative for CSF and/or blood. A 1-2 ml. volume of Isovue-250 was injected and flow of contrast was noted at each level. Radiographs were obtained for documentation purposes.   After attaining the desired flow of contrast documented above, a 0.5 to  1.0 ml test dose of 0.25% Marcaine was injected into each respective transforaminal space.  The patient was observed for 90 seconds post injection.  After no sensory deficits were reported, and normal lower extremity motor function was noted,   the above injectate was administered so that equal amounts of the injectate were placed at each foramen (level) into the transforaminal epidural space.   Additional Comments:  The patient tolerated the procedure well Dressing: 2 x 2 sterile gauze and Band-Aid    Post-procedure details: Patient was observed during the procedure. Post-procedure instructions were reviewed.  Patient left the clinic in stable condition.

## 2018-09-11 NOTE — Progress Notes (Signed)
Numeric Pain Rating Scale and Functional Assessment Average Pain 1   In the last MONTH (on 0-10 scale) has pain interfered with the following?  1. General activity like being  able to carry out your everyday physical activities such as walking, climbing stairs, carrying groceries, or moving a chair?  Rating(1)   +Driver, -BT, -Dye Allergies Takes Plavix and Baby ASA

## 2018-09-11 NOTE — Progress Notes (Signed)
Erik [name] - 78 y.o. male MRN [id number]  Date of birth: 15-Aug-1940  Office Visit Note: Visit Date: 09/11/2018 PCP: [name], MD Referred by: [name], MD  Subjective: Chief Complaint  Patient presents with  . Lower Back - Pain   HPI:  Erik [name] is a 78 y.o. male who comes in today At the request of Dr. Jean Rosenthal for right L4 transforaminal epidural steroid injection diagnostically and hopefully therapeutically.  Patient has MRI evidence of disc herniation protrusion at L4-5 which is foraminal and lateral recess.  This distinctly looks like it is near the L4 nerve root on the right.  Initially the pain was very severe with tingling paresthesia and a pretty classic L4 distribution somewhat L5 distribution.  Now over several weeks of conservative care he has really gotten pain relief compared to where he started but is still referring from the buttock area to the anterior thigh and into the medial calf with a lot of paresthesia and tingling.  He has no left-sided complaints.  His symptoms are still to the point where he is not doing a lot of things that he would like to do and he still using a cane for ambulation.  He is failed conservative care otherwise.  His case is complicated by history of cardiac disease and peripheral vascular disease with a history of vascular claudication.  He is on anticoagulation.  ROS Otherwise per HPI.  Assessment & Plan: Visit Diagnoses:  1. Lumbar radiculopathy     Plan: No additional findings.   Meds & Orders:  Meds ordered this encounter  Medications  . betamethasone acetate-betamethasone sodium phosphate (CELESTONE) injection 12 mg    Orders Placed This Encounter  Procedures  . XR C-ARM NO REPORT  . Epidural Steroid injection    Follow-up: Return if symptoms worsen or fail to improve.   Procedures: No procedures performed  Lumbosacral Transforaminal Epidural Steroid Injection - Sub-Pedicular Approach with  Fluoroscopic Guidance  Patient: Erik [name]      Date of Birth: December 17, 1940 MRN: [id number] PCP: [name], MD      Visit Date: 09/11/2018   Universal Protocol:    Date/Time: 09/11/2018  Consent Given By: the patient  Position: PRONE  Additional Comments: Vital signs were monitored before and after the procedure. Patient was prepped and draped in the usual sterile fashion. The correct patient, procedure, and site was verified.   Injection Procedure Details:  Procedure Site One Meds Administered:  Meds ordered this encounter  Medications  . betamethasone acetate-betamethasone sodium phosphate (CELESTONE) injection 12 mg    Laterality: Right  Location/Site:  L4-L5  Needle size: 22 G  Needle type: Spinal  Needle Placement: Transforaminal  Findings:    -Comments: Excellent flow of contrast along the nerve and into the epidural space.  Procedure Details: After squaring off the end-plates to get a true AP view, the C-arm was positioned so that an oblique view of the foramen as noted above was visualized. The target area is just inferior to the "nose of the scotty dog" or sub pedicular. The soft tissues overlying this structure were infiltrated with 2-3 ml. of 1% Lidocaine without Epinephrine.  The spinal needle was inserted toward the target using a "trajectory" view along the fluoroscope beam.  Under AP and lateral visualization, the needle was advanced so it did not puncture dura and was located close the 6 O'Clock position of the pedical in AP tracterory. Biplanar projections were used to  confirm position. Aspiration was confirmed to be negative for CSF and/or blood. A 1-2 ml. volume of Isovue-250 was injected and flow of contrast was noted at each level. Radiographs were obtained for documentation purposes.   After attaining the desired flow of contrast documented above, a 0.5 to 1.0 ml test dose of 0.25% Marcaine was injected into each respective transforaminal  space.  The patient was observed for 90 seconds post injection.  After no sensory deficits were reported, and normal lower extremity motor function was noted,   the above injectate was administered so that equal amounts of the injectate were placed at each foramen (level) into the transforaminal epidural space.   Additional Comments:  The patient tolerated the procedure well Dressing: 2 x 2 sterile gauze and Band-Aid    Post-procedure details: Patient was observed during the procedure. Post-procedure instructions were reviewed.  Patient left the clinic in stable condition.     Clinical History: MRI LUMBAR SPINE WITHOUT CONTRAST  TECHNIQUE: Multiplanar, multisequence MR imaging of the lumbar spine was performed. No intravenous contrast was administered.  COMPARISON:  CT abdomen 08/04/2018  FINDINGS: Segmentation:  5 lumbar type vertebral bodies.  Alignment:  Some straightening of the normal lumbar lordosis.  Vertebrae: Limited evaluation because of artifact secondary to vascular stent graft. No primary bone pathology suspected.  Conus medullaris and cauda equina: Conus extends to the L1-2 level. Conus and cauda equina appear normal.  Paraspinal and other soft tissues: Limited evaluation because of stent graph artifact.  Disc levels:  No significant finding at L3-4 or above. No canal or foraminal stenosis.  L4-5: Shallow right posterolateral disc herniation with foraminal extension. Small foraminal disc fragment likely compressing the right L4 nerve. Narrowing of the right lateral recess could also affect the right L5 nerve.  L5-S1: Chronic disc degeneration with loss of disc height. Endplate osteophytes and mild bulging of the disc. Mild facet hypertrophy. No compressive central canal stenosis. Mild narrowing of the lateral recesses and foramina but no likely neural compression.  IMPRESSION: Somewhat limited by stent graph artifact.  The  significant finding is likely at L4-5, where there is a right posterolateral to foraminal disc herniation. Small disc fragment in the foramen likely compresses the right L4 nerve. Mild narrowing of the right lateral recess could also affect the right L5 nerve.   Electronically Signed   By: Nelson Chimes M.D.   On: 08/12/2018 08:41     Objective:  VS:  HT:    WT:   BMI:     BP:(!) 145/60  HR:70bpm  TEMP: ( )  RESP:96 % Physical Exam  Ortho Exam Imaging: Xr C-arm No Report  Result Date: 09/11/2018 Please see Notes tab for imaging impression.

## 2018-09-14 ENCOUNTER — Ambulatory Visit (INDEPENDENT_AMBULATORY_CARE_PROVIDER_SITE_OTHER): Payer: Medicare Other | Admitting: Orthopaedic Surgery

## 2018-09-14 ENCOUNTER — Other Ambulatory Visit: Payer: Self-pay

## 2018-09-14 ENCOUNTER — Encounter: Payer: Self-pay | Admitting: Orthopaedic Surgery

## 2018-09-14 DIAGNOSIS — M5441 Lumbago with sciatica, right side: Secondary | ICD-10-CM

## 2018-09-14 DIAGNOSIS — I2510 Atherosclerotic heart disease of native coronary artery without angina pectoris: Secondary | ICD-10-CM

## 2018-09-14 NOTE — Progress Notes (Signed)
The patient is a very pleasant 78 year old gentleman who was sent to Dr. Ernestina Patches for an epidural steroid injection.  He was having right-sided radicular symptoms affecting his backside and lower extremity and MRI confirmed nerve compression.  Only 3 to 4 days ago he had an injection by Dr. Ernestina Patches.  This was at L4-L5 to the right and was a transforaminal epidural steroid.  The patient said he is doing very well and injection was very helpful.  He feels like the leg is still weak but his pain is significantly less.  On exam he appears comfortable.  He gets out of chair easily.  I do not feel a lot of weakness in his leg on the right side or left side.  He seems to have good strength overall.  He has a negative straight leg raise and he appears comfortable.  At this point he is having a good outcome thus far so he can follow-up as needed.  I talked to him in length about his back and if he gets to the point where he feels like another injection would continues to be needed he should call us first because then we can just set him up with Dr. Ernestina Patches.

## 2018-09-18 ENCOUNTER — Telehealth: Payer: Self-pay | Admitting: Internal Medicine

## 2018-09-18 NOTE — Telephone Encounter (Signed)

## 2018-09-19 ENCOUNTER — Ambulatory Visit (AMBULATORY_SURGERY_CENTER): Payer: Medicare Other | Admitting: Internal Medicine

## 2018-09-19 ENCOUNTER — Other Ambulatory Visit: Payer: Self-pay

## 2018-09-19 ENCOUNTER — Encounter: Payer: Self-pay | Admitting: Internal Medicine

## 2018-09-19 VITALS — BP 162/72 | HR 68 | Temp 98.5°F | Resp 16 | Ht 66.0 in | Wt 136.0 lb

## 2018-09-19 DIAGNOSIS — K449 Diaphragmatic hernia without obstruction or gangrene: Secondary | ICD-10-CM | POA: Diagnosis not present

## 2018-09-19 DIAGNOSIS — D123 Benign neoplasm of transverse colon: Secondary | ICD-10-CM

## 2018-09-19 DIAGNOSIS — R195 Other fecal abnormalities: Secondary | ICD-10-CM | POA: Diagnosis not present

## 2018-09-19 DIAGNOSIS — K2970 Gastritis, unspecified, without bleeding: Secondary | ICD-10-CM

## 2018-09-19 DIAGNOSIS — K228 Other specified diseases of esophagus: Secondary | ICD-10-CM

## 2018-09-19 DIAGNOSIS — K621 Rectal polyp: Secondary | ICD-10-CM

## 2018-09-19 DIAGNOSIS — D128 Benign neoplasm of rectum: Secondary | ICD-10-CM

## 2018-09-19 DIAGNOSIS — K2951 Unspecified chronic gastritis with bleeding: Secondary | ICD-10-CM | POA: Diagnosis not present

## 2018-09-19 DIAGNOSIS — D129 Benign neoplasm of anus and anal canal: Secondary | ICD-10-CM

## 2018-09-19 DIAGNOSIS — B9681 Helicobacter pylori [H. pylori] as the cause of diseases classified elsewhere: Secondary | ICD-10-CM | POA: Diagnosis not present

## 2018-09-19 DIAGNOSIS — R634 Abnormal weight loss: Secondary | ICD-10-CM

## 2018-09-19 MED ORDER — SODIUM CHLORIDE 0.9 % IV SOLN: 500.0000 mL | INTRAVENOUS | Status: DC

## 2018-09-19 NOTE — Progress Notes (Signed)
Temp by CW and vitals by JB

## 2018-09-19 NOTE — Progress Notes (Signed)
Called to room to assist during endoscopic procedure.  Patient ID and intended procedure confirmed with present staff. Received instructions for my participation in the procedure from the performing physician.  

## 2018-09-19 NOTE — Patient Instructions (Signed)
Resume your Plavix tomorrow   YOU HAD AN ENDOSCOPIC PROCEDURE TODAY AT [facility]:   Refer to the procedure report that was given to you for any specific questions about what was found during the examination.  If the procedure report does not answer your questions, please call your gastroenterologist to clarify.  If you requested that your care partner not be given the details of your procedure findings, then the procedure report has been included in a sealed envelope for you to review at your convenience later.  YOU SHOULD EXPECT: Some feelings of bloating in the abdomen. Passage of more gas than usual.  Walking can help get rid of the air that was put into your GI tract during the procedure and reduce the bloating. If you had a lower endoscopy (such as a colonoscopy or flexible sigmoidoscopy) you may notice spotting of blood in your stool or on the toilet paper. If you underwent a bowel prep for your procedure, you may not have a normal bowel movement for a few days.  Please Note:  You might notice some irritation and congestion in your nose or some drainage.  This is from the oxygen used during your procedure.  There is no need for concern and it should clear up in a day or so.  SYMPTOMS TO REPORT IMMEDIATELY:   Following lower endoscopy (colonoscopy or flexible sigmoidoscopy):  Excessive amounts of blood in the stool  Significant tenderness or worsening of abdominal pains  Swelling of the abdomen that is new, acute  Fever of 100F or higher   Following upper endoscopy (EGD)  Vomiting of blood or coffee ground material  New chest pain or pain under the shoulder blades  Painful or persistently difficult swallowing  New shortness of breath  Fever of 100F or higher  Black, tarry-looking stools  For urgent or emergent issues, a gastroenterologist can be reached at any hour by calling [phone].   DIET:  We do recommend a small meal at first, but then you may  proceed to your regular diet.  Drink plenty of fluids but you should avoid alcoholic beverages for 24 hours.  ACTIVITY:  You should plan to take it easy for the rest of today and you should NOT DRIVE or use heavy machinery until tomorrow (because of the sedation medicines used during the test).    FOLLOW UP: Our staff will call the number listed on your records 48-72 hours following your procedure to check on you and address any questions or concerns that you may have regarding the information given to you following your procedure. If we do not reach you, we will leave a message.  We will attempt to reach you two times.  During this call, we will ask if you have developed any symptoms of COVID 19. If you develop any symptoms (ie: fever, flu-like symptoms, shortness of breath, cough etc.) before then, please call [phone].  If you test positive for Covid 19 in the 2 weeks post procedure, please call and report this information to [place].    If any biopsies were taken you will be contacted by phone or by letter within the next 1-3 weeks.  Please call us at [phone] if you have not heard about the biopsies in 3 weeks.    SIGNATURES/CONFIDENTIALITY: You and/or your care partner have signed paperwork which will be entered into your electronic medical record.  These signatures attest to the fact that that the information above on your  After Visit Summary has been reviewed and is understood.  Full responsibility of the confidentiality of this discharge information lies with you and/or your care-partner.

## 2018-09-19 NOTE — Op Note (Signed)
[facility] Patient Name: Erik [name] Procedure Date: 09/19/2018 10:26 AM MRN: [id number] Endoscopist: Jerene Bears , MD Age: 78 Referring MD:  Date of Birth: Jan 30, 1941 Gender: Male Account #: [id number] Procedure:                Upper GI endoscopy Indications:              Epigastric abdominal pain, Heme positive stool,                            Weight loss Medicines:                Monitored Anesthesia Care Procedure:                Pre-Anesthesia Assessment:                           - Prior to the procedure, a History and Physical                            was performed, and patient medications and                            allergies were reviewed. The patient's tolerance of                            previous anesthesia was also reviewed. The risks                            and benefits of the procedure and the sedation                            options and risks were discussed with the patient.                            All questions were answered, and informed consent                            was obtained. Prior Anticoagulants: The patient has                            taken Plavix (clopidogrel), last dose was 5 days                            prior to procedure. ASA Grade Assessment: III - A                            patient with severe systemic disease. After                            reviewing the risks and benefits, the patient was                            deemed in satisfactory condition to undergo the  procedure.                           After obtaining informed consent, the endoscope was                            passed under direct vision. Throughout the                            procedure, the patient's blood pressure, pulse, and                            oxygen saturations were monitored continuously. The                            Endoscope was introduced through the mouth, and   advanced to the second part of duodenum. The upper                            GI endoscopy was accomplished without difficulty.                            The patient tolerated the procedure well. Scope In: Scope Out: Findings:                 Normal mucosa was found in the entire esophagus.                           A 2 cm hiatal hernia was present.                           A low-grade of narrowing Schatzki ring was found at                            the gastroesophageal junction.                           Atrophic-appearing mucosa was found in the gastric                            body. This was characterized by linear areas of                            very pale mucosa. Multiple biopsies were taken with                            a cold forceps for histology.                           The gastric antrum was normal. Biopsies were taken                            with a cold forceps for histology and Helicobacter  pylori testing.                           The examined duodenum was normal. Complications:            No immediate complications. Estimated Blood Loss:     Estimated blood loss was minimal. Impression:               - Normal mucosa was found in the entire esophagus.                           - 2 cm hiatal hernia.                           - Non-obstructing Schatzki's ring at GE junction.                           - Gastric mucosal atrophy. Biopsied.                           - Normal antrum. Biopsied.                           - Normal examined duodenum. Recommendation:           - Patient has a contact number available for                            emergencies. The signs and symptoms of potential                            delayed complications were discussed with the                            patient. Return to normal activities tomorrow.                            Written discharge instructions were provided to the                             patient.                           - Resume previous diet.                           - Continue present medications.                           - Await pathology results.                           - See the other procedure note for documentation of                            additional recommendations. Jerene Bears, MD 09/19/2018 11:04:13 AM This report has been signed electronically.

## 2018-09-19 NOTE — Progress Notes (Signed)
A/ox3, pleased with MAC, report to RN 

## 2018-09-19 NOTE — Op Note (Signed)
[facility] Patient Name: Erik [name] Procedure Date: 09/19/2018 10:25 AM MRN: [id number] Endoscopist: Jerene Bears , MD Age: 78 Referring MD:  Date of Birth: 1941/01/14 Gender: Male Account #: [id number] Procedure:                Colonoscopy Indications:              Epigastric abdominal pain, Heme positive stool,                            Weight loss Medicines:                Monitored Anesthesia Care Procedure:                Pre-Anesthesia Assessment:                           - Prior to the procedure, a History and Physical                            was performed, and patient medications and                            allergies were reviewed. The patient's tolerance of                            previous anesthesia was also reviewed. The risks                            and benefits of the procedure and the sedation                            options and risks were discussed with the patient.                            All questions were answered, and informed consent                            was obtained. Prior Anticoagulants: The patient has                            taken Plavix (clopidogrel), last dose was 5 days                            prior to procedure. ASA Grade Assessment: III - A                            patient with severe systemic disease. After                            reviewing the risks and benefits, the patient was                            deemed in satisfactory condition to undergo the  procedure.                           After obtaining informed consent, the colonoscope                            was passed under direct vision. Throughout the                            procedure, the patient's blood pressure, pulse, and                            oxygen saturations were monitored continuously. The                            Colonoscope was introduced through the anus and                            advanced to  the cecum, identified by appendiceal                            orifice and ileocecal valve. The colonoscopy was                            performed without difficulty. The patient tolerated                            the procedure well. The quality of the bowel                            preparation was good. The ileocecal valve,                            appendiceal orifice, and rectum were photographed. Scope In: 10:41:05 AM Scope Out: 10:54:54 AM Scope Withdrawal Time: 0 hours 11 minutes 45 seconds  Total Procedure Duration: 0 hours 13 minutes 49 seconds  Findings:                 The digital rectal exam was normal.                           Two sessile polyps were found in the transverse                            colon. The polyps were 5 to 6 mm in size. These                            polyps were removed with a cold snare. Resection                            and retrieval were complete.                           A 5 mm polyp was found in the rectum. The polyp was  sessile. The polyp was removed with a cold snare.                            Resection and retrieval were complete.                           Multiple small-mouthed diverticula were found in                            the sigmoid colon and descending colon.                           Internal hemorrhoids were found during                            retroflexion. The hemorrhoids were medium-sized. Complications:            No immediate complications. Estimated Blood Loss:     Estimated blood loss was minimal. Impression:               - Two 5 to 6 mm polyps in the transverse colon,                            removed with a cold snare. Resected and retrieved.                           - One 5 mm polyp in the rectum, removed with a cold                            snare. Resected and retrieved.                           - Diverticulosis in the sigmoid colon and in the                             descending colon.                           - Internal hemorrhoids. Recommendation:           - Patient has a contact number available for                            emergencies. The signs and symptoms of potential                            delayed complications were discussed with the                            patient. Return to normal activities tomorrow.                            Written discharge instructions were provided to the  patient.                           - Resume previous diet.                           - Continue present medications.                           - Await pathology results.                           - If persistent abdominal pain and weight loss,                            then I recommend consideration of mesenteric duplex                            angiography.                           - Okay to resume Plavix tomorrow at normal dose.                           - No recommendation at this time regarding repeat                            colonoscopy due to age (>80 at next surveillance                            interval). Jerene Bears, MD 09/19/2018 11:08:14 AM This report has been signed electronically.

## 2018-09-21 ENCOUNTER — Other Ambulatory Visit (HOSPITAL_COMMUNITY): Payer: Self-pay | Admitting: Cardiology

## 2018-09-21 ENCOUNTER — Telehealth: Payer: Self-pay | Admitting: *Deleted

## 2018-09-21 NOTE — Telephone Encounter (Signed)
  Follow up Call-  Call back number 09/19/2018  Post procedure Call Back phone  # [phone]  Permission to leave phone message Yes  Some recent data might be hidden     Patient questions:  Do you have a fever, pain , or abdominal swelling? No. Pain Score  0 *  Have you tolerated food without any problems? Yes.    Have you been able to return to your normal activities? Yes.    Do you have any questions about your discharge instructions: Diet   No. Medications  NO Follow up visit  No.  Do you have questions or concerns about your Care? No.  Actions: * If pain score is 4 or above: No action needed, pain <4.  1. Have you developed a fever since your procedure? NO  2.   Have you had an respiratory symptoms (SOB or cough) since your procedure? NO  3.   Have you tested positive for COVID 19 since your procedure NO  4.   Have you had any family members/close contacts diagnosed with the COVID 19 since your procedure?  NO   If yes to any of these questions please route to Joylene John, RN and Alphonsa Gin, RN.

## 2018-09-27 ENCOUNTER — Other Ambulatory Visit: Payer: Self-pay

## 2018-09-27 MED ORDER — OMEPRAZOLE 20 MG PO CPDR: 20.0000 mg | DELAYED_RELEASE_CAPSULE | Freq: Two times a day (BID) | ORAL | 0 refills | Status: DC

## 2018-09-27 MED ORDER — BIS SUBCIT-METRONID-TETRACYC 140-125-125 MG PO CAPS: 3.0000 | ORAL_CAPSULE | Freq: Three times a day (TID) | ORAL | 0 refills | Status: DC

## 2018-09-28 ENCOUNTER — Telehealth: Payer: Self-pay | Admitting: *Deleted

## 2018-09-28 NOTE — Telephone Encounter (Signed)
Pharmacy called back regarding drug interaction.  Dr. Hilarie Fredrickson is off today and tomorrow.

## 2018-09-28 NOTE — Telephone Encounter (Signed)
We have received a fax from patient's pharmacy, [facility], indicating "patient requests new rx: patient is on plavix, please send alternative to omeprazole due to drug interaction with Plavix."  Dr Hilarie Fredrickson, please advise.Marland KitchenMarland KitchenMarland Kitchen

## 2018-09-29 MED ORDER — PANTOPRAZOLE SODIUM 40 MG PO TBEC: 40.0000 mg | DELAYED_RELEASE_TABLET | Freq: Two times a day (BID) | ORAL | 3 refills | Status: DC

## 2018-09-29 NOTE — Telephone Encounter (Signed)
Substitute pantoprazole 40 mg BID for omeprazole (this substitution can be made in the future when needed)

## 2018-09-29 NOTE — Telephone Encounter (Signed)
Rx changed to pantoprazole 40 mg twice daily in place of omeprazole. Patient verbalizes understanding of this.

## 2018-10-02 ENCOUNTER — Other Ambulatory Visit: Payer: Self-pay

## 2018-10-02 ENCOUNTER — Other Ambulatory Visit
Admission: RE | Admit: 2018-10-02 | Discharge: 2018-10-02 | Disposition: A | Discharge status: Home / Self Care | Payer: Medicare Other | Source: Ambulatory Visit | Attending: Ophthalmology | Admitting: Ophthalmology

## 2018-10-02 DIAGNOSIS — Z1159 Encounter for screening for other viral diseases: Secondary | ICD-10-CM | POA: Insufficient documentation

## 2018-10-02 LAB — SARS CORONAVIRUS 2 (TAT 6-24 HRS): SARS Coronavirus 2: NEGATIVE

## 2018-10-04 ENCOUNTER — Encounter: Payer: Self-pay | Admitting: *Deleted

## 2018-10-05 ENCOUNTER — Other Ambulatory Visit: Payer: Self-pay

## 2018-10-05 ENCOUNTER — Encounter
Admission: RE | Disposition: A | Discharge status: Home / Self Care | Payer: Self-pay | Source: Home / Self Care | Attending: Ophthalmology

## 2018-10-05 ENCOUNTER — Ambulatory Visit: Payer: Medicare Other | Admitting: Anesthesiology

## 2018-10-05 ENCOUNTER — Ambulatory Visit
Admission: RE | Admit: 2018-10-05 | Discharge: 2018-10-05 | Disposition: A | Discharge status: Home / Self Care | Payer: Medicare Other | Attending: Ophthalmology | Admitting: Ophthalmology

## 2018-10-05 ENCOUNTER — Encounter: Payer: Self-pay | Admitting: *Deleted

## 2018-10-05 DIAGNOSIS — I110 Hypertensive heart disease with heart failure: Secondary | ICD-10-CM | POA: Insufficient documentation

## 2018-10-05 DIAGNOSIS — Z79899 Other long term (current) drug therapy: Secondary | ICD-10-CM | POA: Insufficient documentation

## 2018-10-05 DIAGNOSIS — N400 Enlarged prostate without lower urinary tract symptoms: Secondary | ICD-10-CM | POA: Insufficient documentation

## 2018-10-05 DIAGNOSIS — I252 Old myocardial infarction: Secondary | ICD-10-CM | POA: Diagnosis not present

## 2018-10-05 DIAGNOSIS — G2581 Restless legs syndrome: Secondary | ICD-10-CM | POA: Insufficient documentation

## 2018-10-05 DIAGNOSIS — Z7982 Long term (current) use of aspirin: Secondary | ICD-10-CM | POA: Diagnosis not present

## 2018-10-05 DIAGNOSIS — K219 Gastro-esophageal reflux disease without esophagitis: Secondary | ICD-10-CM | POA: Diagnosis not present

## 2018-10-05 DIAGNOSIS — I509 Heart failure, unspecified: Secondary | ICD-10-CM | POA: Diagnosis not present

## 2018-10-05 DIAGNOSIS — F17200 Nicotine dependence, unspecified, uncomplicated: Secondary | ICD-10-CM | POA: Diagnosis not present

## 2018-10-05 DIAGNOSIS — B9681 Helicobacter pylori [H. pylori] as the cause of diseases classified elsewhere: Secondary | ICD-10-CM | POA: Diagnosis not present

## 2018-10-05 DIAGNOSIS — I2510 Atherosclerotic heart disease of native coronary artery without angina pectoris: Secondary | ICD-10-CM | POA: Diagnosis not present

## 2018-10-05 DIAGNOSIS — E7800 Pure hypercholesterolemia, unspecified: Secondary | ICD-10-CM | POA: Diagnosis not present

## 2018-10-05 DIAGNOSIS — Z955 Presence of coronary angioplasty implant and graft: Secondary | ICD-10-CM | POA: Diagnosis not present

## 2018-10-05 DIAGNOSIS — H2511 Age-related nuclear cataract, right eye: Secondary | ICD-10-CM | POA: Diagnosis not present

## 2018-10-05 DIAGNOSIS — D649 Anemia, unspecified: Secondary | ICD-10-CM | POA: Diagnosis not present

## 2018-10-05 DIAGNOSIS — J449 Chronic obstructive pulmonary disease, unspecified: Secondary | ICD-10-CM | POA: Insufficient documentation

## 2018-10-05 DIAGNOSIS — I5022 Chronic systolic (congestive) heart failure: Secondary | ICD-10-CM | POA: Diagnosis not present

## 2018-10-05 HISTORY — PX: CATARACT EXTRACTION W/PHACO: SHX586

## 2018-10-05 HISTORY — DX: Restless legs syndrome: G25.81

## 2018-10-05 SURGERY — PHACOEMULSIFICATION, CATARACT, WITH IOL INSERTION
Anesthesia: Monitor Anesthesia Care | Site: Eye | Laterality: Right

## 2018-10-05 MED ORDER — MOXIFLOXACIN HCL 0.5 % OP SOLN: 1.0000 [drp] | OPHTHALMIC | Status: DC | PRN

## 2018-10-05 MED ORDER — ARMC OPHTHALMIC DILATING DROPS
OPHTHALMIC | Status: AC
  Filled 2018-10-05: qty 0.5

## 2018-10-05 MED ORDER — MIDAZOLAM HCL 2 MG/2ML IJ SOLN
INTRAMUSCULAR | Status: AC
  Filled 2018-10-05: qty 2

## 2018-10-05 MED ORDER — ARMC OPHTHALMIC DILATING DROPS
1.0000 "application " | OPHTHALMIC | Status: AC
  Administered 2018-10-05 (×3): 1 via OPHTHALMIC

## 2018-10-05 MED ORDER — TETRACAINE HCL 0.5 % OP SOLN
OPHTHALMIC | Status: AC
  Filled 2018-10-05: qty 4

## 2018-10-05 MED ORDER — MOXIFLOXACIN HCL 0.5 % OP SOLN
OPHTHALMIC | Status: AC
  Filled 2018-10-05: qty 3

## 2018-10-05 MED ORDER — TETRACAINE HCL 0.5 % OP SOLN
1.0000 [drp] | OPHTHALMIC | Status: DC | PRN
  Administered 2018-10-05: 1 [drp] via OPHTHALMIC

## 2018-10-05 MED ORDER — NA CHONDROIT SULF-NA HYALURON 40-17 MG/ML IO SOLN
INTRAOCULAR | Status: DC | PRN
  Administered 2018-10-05: 1 mL via INTRAOCULAR

## 2018-10-05 MED ORDER — LIDOCAINE HCL (PF) 4 % IJ SOLN
INTRAOCULAR | Status: DC | PRN
  Administered 2018-10-05: 4 mL via OPHTHALMIC

## 2018-10-05 MED ORDER — POVIDONE-IODINE 5 % OP SOLN
OPHTHALMIC | Status: DC | PRN
  Administered 2018-10-05: 1 via OPHTHALMIC

## 2018-10-05 MED ORDER — SODIUM CHLORIDE 0.9 % IV SOLN
INTRAVENOUS | Status: DC
  Administered 2018-10-05: 07:00:00 via INTRAVENOUS

## 2018-10-05 MED ORDER — ONDANSETRON HCL 4 MG/2ML IJ SOLN
INTRAMUSCULAR | Status: DC | PRN
  Administered 2018-10-05: 4 mg via INTRAVENOUS

## 2018-10-05 MED ORDER — MIDAZOLAM HCL 2 MG/2ML IJ SOLN
INTRAMUSCULAR | Status: DC | PRN
  Administered 2018-10-05 (×2): 1 mg via INTRAVENOUS

## 2018-10-05 MED ORDER — MOXIFLOXACIN HCL 0.5 % OP SOLN
OPHTHALMIC | Status: DC | PRN
  Administered 2018-10-05: 0.2 mL via OPHTHALMIC

## 2018-10-05 MED ORDER — EPINEPHRINE PF 1 MG/ML IJ SOLN
INTRAOCULAR | Status: DC | PRN
  Administered 2018-10-05: 1 mL via OPHTHALMIC

## 2018-10-05 MED ORDER — CARBACHOL 0.01 % IO SOLN
INTRAOCULAR | Status: DC | PRN
  Administered 2018-10-05: 0.5 mL via INTRAOCULAR

## 2018-10-05 SURGICAL SUPPLY — 16 items
GLOVE BIO SURGEON STRL SZ8 (GLOVE) ×3 IMPLANT
GLOVE BIOGEL M 6.5 STRL (GLOVE) ×3 IMPLANT
GLOVE SURG LX 8.0 MICRO (GLOVE) ×2
GLOVE SURG LX STRL 8.0 MICRO (GLOVE) ×1 IMPLANT
GOWN STRL REUS W/ TWL LRG LVL3 (GOWN DISPOSABLE) ×2 IMPLANT
GOWN STRL REUS W/TWL LRG LVL3 (GOWN DISPOSABLE) ×4
LABEL CATARACT MEDS ST (LABEL) ×3 IMPLANT
LENS IOL TECNIS ITEC 17.5 (Intraocular Lens) ×3 IMPLANT
PACK CATARACT (MISCELLANEOUS) ×3 IMPLANT
PACK CATARACT BRASINGTON LX (MISCELLANEOUS) ×3 IMPLANT
PACK EYE AFTER SURG (MISCELLANEOUS) ×3 IMPLANT
SOL BSS BAG (MISCELLANEOUS) ×3
SOLUTION BSS BAG (MISCELLANEOUS) ×1 IMPLANT
SYR 5ML LL (SYRINGE) ×3 IMPLANT
WATER STERILE IRR 250ML POUR (IV SOLUTION) ×3 IMPLANT
WIPE NON LINTING 3.25X3.25 (MISCELLANEOUS) ×3 IMPLANT

## 2018-10-05 NOTE — Anesthesia Post-op Follow-up Note (Signed)
Anesthesia QCDR form completed.        

## 2018-10-05 NOTE — H&P (Signed)
All labs reviewed. Abnormal studies sent to patients PCP when indicated.  Previous H&P reviewed, patient examined, there are NO CHANGES.  Erik Stemmer Porfilio7/23/20208:00 AM

## 2018-10-05 NOTE — Transfer of Care (Signed)
Immediate Anesthesia Transfer of Care Note  Patient: Erik [name].  Procedure(s) Performed: CATARACT EXTRACTION PHACO AND INTRAOCULAR LENS PLACEMENT (IOC), RIGHT (Right Eye)  Patient Location: Short Stay  Anesthesia Type:MAC  Level of Consciousness: drowsy  Airway & Oxygen Therapy: Patient Spontanous Breathing  Post-op Assessment: Report given to RN and Post -op Vital signs reviewed and stable  Post vital signs: Reviewed and stable  Last Vitals: see flow sheet for vital signs Vitals Value Taken Time  BP    Temp    Pulse    Resp    SpO2      Last Pain:  Vitals:   10/05/18 0639  PainSc: 0-No pain         Complications: No apparent anesthesia complications

## 2018-10-05 NOTE — Anesthesia Preprocedure Evaluation (Signed)
Anesthesia Evaluation  Patient identified by MRN, date of birth, ID band Patient awake    Reviewed: Allergy & Precautions, NPO status , Patient's Chart, lab work & pertinent test results  History of Anesthesia Complications Negative for: history of anesthetic complications  Airway Mallampati: III       Dental  (+)    Pulmonary neg sleep apnea, COPD,  COPD inhaler, Current Smoker,           Cardiovascular hypertension, + Past MI (pt denies) and +CHF  + Valvular Problems/Murmurs      Neuro/Psych neg Seizures    GI/Hepatic Neg liver ROS, GERD  Medicated and Controlled,  Endo/Other  neg diabetes  Renal/GU negative Renal ROS     Musculoskeletal   Abdominal   Peds  Hematology   Anesthesia Other Findings   Reproductive/Obstetrics                             Anesthesia Physical Anesthesia Plan  ASA: III  Anesthesia Plan: MAC   Post-op Pain Management:    Induction: Intravenous  PONV Risk Score and Plan:   Airway Management Planned: Nasal Cannula  Additional Equipment:   Intra-op Plan:   Post-operative Plan:   Informed Consent: I have reviewed the patients History and Physical, chart, labs and discussed the procedure including the risks, benefits and alternatives for the proposed anesthesia with the patient or authorized representative who has indicated his/her understanding and acceptance.       Plan Discussed with:   Anesthesia Plan Comments:         Anesthesia Quick Evaluation

## 2018-10-05 NOTE — Op Note (Signed)
PREOPERATIVE DIAGNOSIS:  Nuclear sclerotic cataract of the right eye.   POSTOPERATIVE DIAGNOSIS:  NUCLEAR SCLEROTIC CATARACT RIGHT EYE   OPERATIVE PROCEDURE: Procedure(s): CATARACT EXTRACTION PHACO AND INTRAOCULAR LENS PLACEMENT ([place]), RIGHT   SURGEON:  Birder Robson, MD.   ANESTHESIA:  Anesthesiologist: Gunnar Fusi, MD CRNA: Eben Burow, CRNA  1.      Managed anesthesia care. 2.      0.64ml of Shugarcaine was instilled in the eye following the paracentesis.   COMPLICATIONS:  None.   TECHNIQUE:   Stop and chop   DESCRIPTION OF PROCEDURE:  The patient was examined and consented in the preoperative holding area where the aforementioned topical anesthesia was applied to the right eye and then brought back to the Operating Room where the right eye was prepped and draped in the usual sterile ophthalmic fashion and a lid speculum was placed. A paracentesis was created with the side port blade and the anterior chamber was filled with viscoelastic. A near clear corneal incision was performed with the steel keratome. A continuous curvilinear capsulorrhexis was performed with a cystotome followed by the capsulorrhexis forceps. Hydrodissection and hydrodelineation were carried out with BSS on a blunt cannula. The lens was removed in a stop and chop  technique and the remaining cortical material was removed with the irrigation-aspiration handpiece. The capsular bag was inflated with viscoelastic and the Technis ZCB00  lens was placed in the capsular bag without complication. The remaining viscoelastic was removed from the eye with the irrigation-aspiration handpiece. The wounds were hydrated. The anterior chamber was flushed with Miostat and the eye was inflated to physiologic pressure. 0.34ml of Vigamox was placed in the anterior chamber. The wounds were found to be water tight. The eye was dressed with Vigamox. The patient was given protective glasses to wear throughout the day and a shield  with which to sleep tonight. The patient was also given drops with which to begin a drop regimen today and will follow-up with me in one day. Implant Name Type Inv. Item Serial No. Manufacturer Lot No. LRB No. Used Action  LENS IOL DIOP 17.5 - [id number] Intraocular Lens LENS IOL DIOP 17.5 [id number] AMO  Right 1 Implanted   Procedure(s) with comments: CATARACT EXTRACTION PHACO AND INTRAOCULAR LENS PLACEMENT ([facility]), RIGHT (Right) - [place] 01:06.4 cde 12.79 Fluid Pack Lot # [id number]  Electronically signed: Birder Robson 10/05/2018 8:27 AM

## 2018-10-05 NOTE — Discharge Instructions (Signed)
Eye Surgery Discharge Instructions  Expect mild scratchy sensation or mild soreness. DO NOT RUB YOUR EYE!  The day of surgery:  Minimal physical activity, but bed rest is not required  No reading, computer work, or close hand work  No bending, lifting, or straining.  May watch TV  For 24 hours:  No driving, legal decisions, or alcoholic beverages  Safety precautions  Eat anything you prefer: It is better to start with liquids, then soup then solid foods.  Solar shield eyeglasses should be worn for comfort in the sunlight/patch while sleeping  Resume all regular medications including aspirin or Coumadin if these were discontinued prior to surgery. You may shower, bathe, shave, or wash your hair. Tylenol may be taken for mild discomfort. Follow Dr. Inda Coke discharge instruction sheet as reviewed.  Call your doctor if you experience significant pain, nausea, or vomiting, fever > 101 or other signs of infection. [phone] or [phone] Specific instructions:  Follow-up Information    Birder Robson, MD Follow up.   Specialty: Ophthalmology Why: 10/06/18 @ 1:50 pm Contact information: [address] [place] [address] [phone]

## 2018-10-05 NOTE — Anesthesia Postprocedure Evaluation (Signed)
Anesthesia Post Note  Patient: Erik [name].  Procedure(s) Performed: CATARACT EXTRACTION PHACO AND INTRAOCULAR LENS PLACEMENT ([place]), RIGHT (Right Eye)  Patient location during evaluation: Short Stay Anesthesia Type: MAC Level of consciousness: awake and alert, oriented and patient cooperative Pain management: satisfactory to patient Vital Signs Assessment: post-procedure vital signs reviewed and stable Respiratory status: spontaneous breathing, nonlabored ventilation and respiratory function stable Cardiovascular status: stable Postop Assessment: no headache and no apparent nausea or vomiting Anesthetic complications: no     Last Vitals:  Vitals:   10/05/18 0639 10/05/18 0827  BP: (!) 148/66 (!) 117/43  Pulse: 73 68  Resp: 16 16  Temp: (!) 36.1 C (!) 36.4 C  SpO2: 98% 98%    Last Pain:  Vitals:   10/05/18 0827  TempSrc: Temporal  PainSc: 0-No pain                 Eben Burow

## 2018-10-11 ENCOUNTER — Encounter: Payer: Self-pay | Admitting: Physician Assistant

## 2018-10-11 ENCOUNTER — Ambulatory Visit (INDEPENDENT_AMBULATORY_CARE_PROVIDER_SITE_OTHER): Payer: Medicare Other | Admitting: Physician Assistant

## 2018-10-11 VITALS — Ht 66.0 in | Wt 132.0 lb

## 2018-10-11 DIAGNOSIS — M6281 Muscle weakness (generalized): Secondary | ICD-10-CM | POA: Diagnosis not present

## 2018-10-11 DIAGNOSIS — R2689 Other abnormalities of gait and mobility: Secondary | ICD-10-CM | POA: Diagnosis not present

## 2018-10-11 DIAGNOSIS — I2510 Atherosclerotic heart disease of native coronary artery without angina pectoris: Secondary | ICD-10-CM

## 2018-10-11 NOTE — Progress Notes (Signed)
HPI: [name] returns today rating of both of his legs feel weak.  He did undergo epidural steroid injection which was an L4-5 right transforaminal injection.  He states that he has no pain in his back.  Radicular symptoms.  Both legs just feel very wobbly he is having use a cane to ambulate.  He has to use his left leg to go up and down stairs due to the fact that his right leg feels considerably weaker.  Review of systems: No fevers or chills.  See HPI otherwise negative  Physical exam: Bilateral lower extremities 5 out of 5 strength throughout lower extremities against resistance.  Good range of motion both knees without pain.  He has considerable quad atrophy bilaterally.  Impression: Bilateral quad atrophy Gait balance disturbance  Plan: We will send him to physical therapy to work on range of motion, strengthening ,modalities and gait/ balance.  See him back in 3 months check his advancement following therapy.  Questions were encouraged and answered

## 2018-10-16 ENCOUNTER — Other Ambulatory Visit: Payer: Self-pay | Admitting: Internal Medicine

## 2018-10-18 DIAGNOSIS — M6281 Muscle weakness (generalized): Secondary | ICD-10-CM | POA: Diagnosis not present

## 2018-10-18 DIAGNOSIS — H2512 Age-related nuclear cataract, left eye: Secondary | ICD-10-CM | POA: Diagnosis not present

## 2018-10-18 DIAGNOSIS — R262 Difficulty in walking, not elsewhere classified: Secondary | ICD-10-CM | POA: Diagnosis not present

## 2018-10-24 DIAGNOSIS — R262 Difficulty in walking, not elsewhere classified: Secondary | ICD-10-CM | POA: Diagnosis not present

## 2018-10-24 DIAGNOSIS — M6281 Muscle weakness (generalized): Secondary | ICD-10-CM | POA: Diagnosis not present

## 2018-10-26 DIAGNOSIS — M6281 Muscle weakness (generalized): Secondary | ICD-10-CM | POA: Diagnosis not present

## 2018-10-26 DIAGNOSIS — R262 Difficulty in walking, not elsewhere classified: Secondary | ICD-10-CM | POA: Diagnosis not present

## 2018-10-30 ENCOUNTER — Other Ambulatory Visit: Payer: Self-pay

## 2018-10-30 ENCOUNTER — Other Ambulatory Visit
Admission: RE | Admit: 2018-10-30 | Discharge: 2018-10-30 | Disposition: A | Discharge status: Home / Self Care | Payer: Medicare Other | Source: Ambulatory Visit | Attending: Ophthalmology | Admitting: Ophthalmology

## 2018-10-30 DIAGNOSIS — Z01812 Encounter for preprocedural laboratory examination: Secondary | ICD-10-CM | POA: Diagnosis not present

## 2018-10-30 DIAGNOSIS — M6281 Muscle weakness (generalized): Secondary | ICD-10-CM | POA: Diagnosis not present

## 2018-10-30 DIAGNOSIS — R262 Difficulty in walking, not elsewhere classified: Secondary | ICD-10-CM | POA: Diagnosis not present

## 2018-10-30 DIAGNOSIS — Z20828 Contact with and (suspected) exposure to other viral communicable diseases: Secondary | ICD-10-CM | POA: Insufficient documentation

## 2018-10-30 LAB — SARS CORONAVIRUS 2 (TAT 6-24 HRS): SARS Coronavirus 2: NEGATIVE

## 2018-11-01 DIAGNOSIS — R262 Difficulty in walking, not elsewhere classified: Secondary | ICD-10-CM | POA: Diagnosis not present

## 2018-11-01 DIAGNOSIS — M6281 Muscle weakness (generalized): Secondary | ICD-10-CM | POA: Diagnosis not present

## 2018-11-02 ENCOUNTER — Ambulatory Visit
Admission: RE | Admit: 2018-11-02 | Discharge: 2018-11-02 | Disposition: A | Discharge status: Home / Self Care | Payer: Medicare Other | Attending: Ophthalmology | Admitting: Ophthalmology

## 2018-11-02 ENCOUNTER — Encounter: Payer: Self-pay | Admitting: *Deleted

## 2018-11-02 ENCOUNTER — Ambulatory Visit: Payer: Medicare Other | Admitting: Certified Registered Nurse Anesthetist

## 2018-11-02 ENCOUNTER — Encounter
Admission: RE | Disposition: A | Discharge status: Home / Self Care | Payer: Self-pay | Source: Home / Self Care | Attending: Ophthalmology

## 2018-11-02 DIAGNOSIS — I2510 Atherosclerotic heart disease of native coronary artery without angina pectoris: Secondary | ICD-10-CM | POA: Insufficient documentation

## 2018-11-02 DIAGNOSIS — I110 Hypertensive heart disease with heart failure: Secondary | ICD-10-CM | POA: Diagnosis not present

## 2018-11-02 DIAGNOSIS — Z7902 Long term (current) use of antithrombotics/antiplatelets: Secondary | ICD-10-CM | POA: Insufficient documentation

## 2018-11-02 DIAGNOSIS — Z9841 Cataract extraction status, right eye: Secondary | ICD-10-CM | POA: Insufficient documentation

## 2018-11-02 DIAGNOSIS — I252 Old myocardial infarction: Secondary | ICD-10-CM | POA: Diagnosis not present

## 2018-11-02 DIAGNOSIS — H2512 Age-related nuclear cataract, left eye: Secondary | ICD-10-CM | POA: Diagnosis not present

## 2018-11-02 DIAGNOSIS — Z961 Presence of intraocular lens: Secondary | ICD-10-CM | POA: Diagnosis not present

## 2018-11-02 DIAGNOSIS — D649 Anemia, unspecified: Secondary | ICD-10-CM | POA: Insufficient documentation

## 2018-11-02 DIAGNOSIS — Z79899 Other long term (current) drug therapy: Secondary | ICD-10-CM | POA: Diagnosis not present

## 2018-11-02 DIAGNOSIS — Z885 Allergy status to narcotic agent status: Secondary | ICD-10-CM | POA: Diagnosis not present

## 2018-11-02 DIAGNOSIS — I5022 Chronic systolic (congestive) heart failure: Secondary | ICD-10-CM | POA: Insufficient documentation

## 2018-11-02 DIAGNOSIS — J449 Chronic obstructive pulmonary disease, unspecified: Secondary | ICD-10-CM | POA: Diagnosis not present

## 2018-11-02 DIAGNOSIS — Z955 Presence of coronary angioplasty implant and graft: Secondary | ICD-10-CM | POA: Diagnosis not present

## 2018-11-02 DIAGNOSIS — K219 Gastro-esophageal reflux disease without esophagitis: Secondary | ICD-10-CM | POA: Insufficient documentation

## 2018-11-02 DIAGNOSIS — I739 Peripheral vascular disease, unspecified: Secondary | ICD-10-CM | POA: Diagnosis not present

## 2018-11-02 DIAGNOSIS — I255 Ischemic cardiomyopathy: Secondary | ICD-10-CM | POA: Insufficient documentation

## 2018-11-02 DIAGNOSIS — E785 Hyperlipidemia, unspecified: Secondary | ICD-10-CM | POA: Insufficient documentation

## 2018-11-02 DIAGNOSIS — N400 Enlarged prostate without lower urinary tract symptoms: Secondary | ICD-10-CM | POA: Diagnosis not present

## 2018-11-02 DIAGNOSIS — F17200 Nicotine dependence, unspecified, uncomplicated: Secondary | ICD-10-CM | POA: Insufficient documentation

## 2018-11-02 DIAGNOSIS — E7800 Pure hypercholesterolemia, unspecified: Secondary | ICD-10-CM | POA: Insufficient documentation

## 2018-11-02 DIAGNOSIS — I6523 Occlusion and stenosis of bilateral carotid arteries: Secondary | ICD-10-CM | POA: Insufficient documentation

## 2018-11-02 HISTORY — PX: CATARACT EXTRACTION W/PHACO: SHX586

## 2018-11-02 SURGERY — PHACOEMULSIFICATION, CATARACT, WITH IOL INSERTION
Anesthesia: Monitor Anesthesia Care | Site: Eye | Laterality: Left

## 2018-11-02 MED ORDER — MOXIFLOXACIN HCL 0.5 % OP SOLN
OPHTHALMIC | Status: DC | PRN
  Administered 2018-11-02: .2 mL via OPHTHALMIC

## 2018-11-02 MED ORDER — MIDAZOLAM HCL 2 MG/2ML IJ SOLN
INTRAMUSCULAR | Status: AC
  Filled 2018-11-02: qty 2

## 2018-11-02 MED ORDER — POVIDONE-IODINE 5 % OP SOLN
OPHTHALMIC | Status: DC | PRN
  Administered 2018-11-02: 1 via OPHTHALMIC

## 2018-11-02 MED ORDER — SODIUM CHLORIDE 0.9 % IV SOLN
INTRAVENOUS | Status: DC | PRN
  Administered 2018-11-02: 09:00:00 via INTRAVENOUS

## 2018-11-02 MED ORDER — NA CHONDROIT SULF-NA HYALURON 40-17 MG/ML IO SOLN
INTRAOCULAR | Status: DC | PRN
  Administered 2018-11-02: 1 mL via INTRAOCULAR

## 2018-11-02 MED ORDER — MIDAZOLAM HCL 2 MG/2ML IJ SOLN
INTRAMUSCULAR | Status: DC | PRN
  Administered 2018-11-02: 1 mg via INTRAVENOUS
  Administered 2018-11-02: 0.5 mg via INTRAVENOUS

## 2018-11-02 MED ORDER — MOXIFLOXACIN HCL 0.5 % OP SOLN
OPHTHALMIC | Status: AC
  Filled 2018-11-02: qty 3

## 2018-11-02 MED ORDER — ARMC OPHTHALMIC DILATING DROPS
OPHTHALMIC | Status: AC
  Administered 2018-11-02: 1 via OPHTHALMIC
  Filled 2018-11-02: qty 0.5

## 2018-11-02 MED ORDER — POVIDONE-IODINE 5 % OP SOLN
OPHTHALMIC | Status: AC
  Filled 2018-11-02: qty 30

## 2018-11-02 MED ORDER — ONDANSETRON HCL 4 MG/2ML IJ SOLN
INTRAMUSCULAR | Status: DC | PRN
  Administered 2018-11-02: 4 mg via INTRAVENOUS

## 2018-11-02 MED ORDER — MOXIFLOXACIN HCL 0.5 % OP SOLN
1.0000 [drp] | Freq: Once | OPHTHALMIC | Status: DC
  Filled 2018-11-02: qty 3

## 2018-11-02 MED ORDER — FENTANYL CITRATE (PF) 100 MCG/2ML IJ SOLN
INTRAMUSCULAR | Status: DC | PRN
  Administered 2018-11-02: 25 ug via INTRAVENOUS

## 2018-11-02 MED ORDER — LIDOCAINE HCL (PF) 4 % IJ SOLN
INTRAOCULAR | Status: DC | PRN
  Administered 2018-11-02: 2.25 mL via OPHTHALMIC

## 2018-11-02 MED ORDER — FENTANYL CITRATE (PF) 100 MCG/2ML IJ SOLN
INTRAMUSCULAR | Status: AC
  Filled 2018-11-02: qty 2

## 2018-11-02 MED ORDER — TETRACAINE HCL 0.5 % OP SOLN
1.0000 [drp] | Freq: Once | OPHTHALMIC | Status: AC
  Administered 2018-11-02: 07:00:00 1 [drp] via OPHTHALMIC

## 2018-11-02 MED ORDER — ARMC OPHTHALMIC DILATING DROPS
1.0000 "application " | OPHTHALMIC | Status: AC
  Administered 2018-11-02 (×3): 1 via OPHTHALMIC

## 2018-11-02 MED ORDER — TETRACAINE HCL 0.5 % OP SOLN
OPHTHALMIC | Status: AC
  Administered 2018-11-02: 07:00:00 1 [drp] via OPHTHALMIC
  Filled 2018-11-02: qty 4

## 2018-11-02 MED ORDER — NA CHONDROIT SULF-NA HYALURON 40-17 MG/ML IO SOLN
INTRAOCULAR | Status: AC
  Filled 2018-11-02: qty 1

## 2018-11-02 MED ORDER — EPINEPHRINE PF 1 MG/ML IJ SOLN
INTRAOCULAR | Status: DC | PRN
  Administered 2018-11-02: 09:00:00 1 mL via OPHTHALMIC

## 2018-11-02 MED ORDER — SODIUM CHLORIDE 0.9 % IV SOLN
Freq: Once | INTRAVENOUS | Status: AC
  Administered 2018-11-02: 07:00:00 via INTRAVENOUS

## 2018-11-02 MED ORDER — CARBACHOL 0.01 % IO SOLN
INTRAOCULAR | Status: DC | PRN
  Administered 2018-11-02: .5 mL via INTRAOCULAR

## 2018-11-02 MED ORDER — LIDOCAINE HCL (PF) 4 % IJ SOLN
INTRAMUSCULAR | Status: AC
  Filled 2018-11-02: qty 5

## 2018-11-02 MED ORDER — EPINEPHRINE PF 1 MG/ML IJ SOLN
INTRAMUSCULAR | Status: AC
  Filled 2018-11-02: qty 1

## 2018-11-02 SURGICAL SUPPLY — 16 items
GLOVE BIO SURGEON STRL SZ8 (GLOVE) ×3 IMPLANT
GLOVE BIOGEL M 6.5 STRL (GLOVE) ×3 IMPLANT
GLOVE SURG LX 8.0 MICRO (GLOVE) ×2
GLOVE SURG LX STRL 8.0 MICRO (GLOVE) ×1 IMPLANT
GOWN STRL REUS W/ TWL LRG LVL3 (GOWN DISPOSABLE) ×2 IMPLANT
GOWN STRL REUS W/TWL LRG LVL3 (GOWN DISPOSABLE) ×4
LABEL CATARACT MEDS ST (LABEL) ×3 IMPLANT
LENS IOL TECNIS ITEC 17.0 (Intraocular Lens) ×2 IMPLANT
PACK CATARACT (MISCELLANEOUS) ×3 IMPLANT
PACK CATARACT BRASINGTON LX (MISCELLANEOUS) ×3 IMPLANT
PACK EYE AFTER SURG (MISCELLANEOUS) ×3 IMPLANT
SOL BSS BAG (MISCELLANEOUS) ×3
SOLUTION BSS BAG (MISCELLANEOUS) ×1 IMPLANT
SYR 5ML LL (SYRINGE) ×3 IMPLANT
WATER STERILE IRR 250ML POUR (IV SOLUTION) ×3 IMPLANT
WIPE NON LINTING 3.25X3.25 (MISCELLANEOUS) ×3 IMPLANT

## 2018-11-02 NOTE — Anesthesia Postprocedure Evaluation (Signed)
Anesthesia Post Note  Patient: Bricen Victory.  Procedure(s) Performed: CATARACT EXTRACTION PHACO AND INTRAOCULAR LENS PLACEMENT ([place]), LEFT (Left Eye)  Patient location during evaluation: Phase II Anesthesia Type: MAC Level of consciousness: awake and alert Pain management: pain level controlled Vital Signs Assessment: post-procedure vital signs reviewed and stable Respiratory status: spontaneous breathing, nonlabored ventilation and respiratory function stable Cardiovascular status: blood pressure returned to baseline and stable Postop Assessment: no apparent nausea or vomiting Anesthetic complications: no     Last Vitals:  Vitals:   11/02/18 0707 11/02/18 0855  BP: (!) 168/65 (!) 108/45  Pulse: 68 71  Resp: 20 18  Temp: (!) 36.4 C 36.5 C  SpO2: 99% 100%    Last Pain:  Vitals:   11/02/18 0855  TempSrc: Temporal  PainSc: 0-No pain                 Alphonsus Sias

## 2018-11-02 NOTE — Anesthesia Post-op Follow-up Note (Signed)
Anesthesia QCDR form completed.        

## 2018-11-02 NOTE — Anesthesia Preprocedure Evaluation (Addendum)
Anesthesia Evaluation  Patient identified by MRN, date of birth, ID band Patient awake    Reviewed: Allergy & Precautions, H&P , NPO status , reviewed documented beta blocker date and time   Airway Mallampati: II  TM Distance: >3 FB Neck ROM: full    Dental  (+) Teeth Intact, Caps   Pulmonary COPD, Current Smoker and Patient abstained from smoking.,    Pulmonary exam normal        Cardiovascular hypertension, + CAD, + Past MI, + Peripheral Vascular Disease and +CHF  Normal cardiovascular exam+ Valvular Problems/Murmurs      Neuro/Psych    GI/Hepatic GERD  Medicated and Controlled,  Endo/Other    Renal/GU      Musculoskeletal   Abdominal   Peds  Hematology   Anesthesia Other Findings Past Medical History: No date: Adenomatous colon polyp No date: Aortic aneurysm (HCC) No date: BPH (benign prostatic hypertrophy) No date: Carotid artery stenosis     Comment:  a. Bilateral CEA No date: Cataract No date: CHF (congestive heart failure) (HCC) No date: Chronic systolic heart failure (HCC)     Comment:  a. EF 20-25%, mild LVH, mod HK, mid apicalanteroseptal               myocardium, mild MR, LA mod dilated No date: Collagen vascular disease (HCC) No date: COPD (chronic obstructive pulmonary disease) (HCC) No date: Coronary artery disease     Comment:  a. LHC (08/2013): Lmain: short 30% distal, LAD: sml D1 &               D2, 70% ostial D1, 95-99% LAD stenosis prox D2 LCx:               sml/mod ramus subtot. occluded, 40% ostial set off lg               OM1, 40% AV LCx after OM1, RCA: 90% prox (DES to RCA and               prox LAD) No date: Diverticulosis No date: GERD (gastroesophageal reflux disease) No date: Heart murmur No date: History of colonic polyps No date: Hyperlipidemia No date: Hyperplastic colon polyp No date: Hypertension No date: Ischemic cardiomyopathy No date: RLS (restless legs  syndrome)  Past Surgical History: 09-2013: cardiac stents 04/17/2008: CAROTID ENDARTERECTOMY     Comment:  right 05/30/08: CAROTID ENDARTERECTOMY     Comment:  Left 10/05/2018: CATARACT EXTRACTION W/PHACO; Right     Comment:  Procedure: CATARACT EXTRACTION PHACO AND INTRAOCULAR               LENS PLACEMENT ([place]), RIGHT;  Surgeon: Birder Robson,              MD;  Location: [facility] ORS;  Service: Ophthalmology;                Laterality: Right;  [place] 01:06.4 cde 12.79 Fluid Pack Lot              # [id number] No date: CHOLECYSTECTOMY     Comment:  Gall Bladder 09/11/2013: CORONARY ANGIOGRAM     Comment:  Procedure: CORONARY ANGIOGRAM;  Surgeon: Troy Sine,              MD;  Location: [facility] CATH LAB;  Service: Cardiovascular;; No date: CORONARY ANGIOPLASTY     Comment:  STENTS No date: HERNIA REPAIR 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     Comment:  [facility] 09/11/2013: PERCUTANEOUS STENT INTERVENTION  Comment:  Procedure: PERCUTANEOUS STENT INTERVENTION;  Surgeon:               Troy Sine, MD;  Location: [facility] CATH LAB;  Service:               Cardiovascular;;  DES Prox RCA 3.5x15 xience   BMI    Body Mass Index: 21.14 kg/m    Requests sedation "sooner" this time, will comply   Reproductive/Obstetrics                            Anesthesia Physical Anesthesia Plan  ASA: III  Anesthesia Plan: MAC   Post-op Pain Management:    Induction: Intravenous  PONV Risk Score and Plan: 1 and Treatment may vary due to age or medical condition, TIVA and Midazolam  Airway Management Planned: Nasal Cannula and Natural Airway  Additional Equipment:   Intra-op Plan:   Post-operative Plan:   Informed Consent: I have reviewed the patients History and Physical, chart, labs and discussed the procedure including the risks, benefits and alternatives for the proposed anesthesia with the patient or authorized representative who has indicated his/her understanding and  acceptance.     Dental Advisory Given  Plan Discussed with: CRNA  Anesthesia Plan Comments:         Anesthesia Quick Evaluation

## 2018-11-02 NOTE — H&P (Signed)
All labs reviewed. Abnormal studies sent to patients PCP when indicated.  Previous H&P reviewed, patient examined, there are NO CHANGES.  Erik Probert Porfilio8/20/20208:01 AM

## 2018-11-02 NOTE — Discharge Instructions (Addendum)
Eye Surgery Discharge Instructions  Expect mild scratchy sensation or mild soreness. DO NOT RUB YOUR EYE!  The day of surgery:  Minimal physical activity, but bed rest is not required  No reading, computer work, or close hand work  No bending, lifting, or straining.  May watch TV  For 24 hours:  No driving, legal decisions, or alcoholic beverages  Safety precautions  Eat anything you prefer: It is better to start with liquids, then soup then solid foods.  Solar shield eyeglasses should be worn for comfort in the sunlight/patch while sleeping  Resume all regular medications including aspirin or Coumadin if these were discontinued prior to surgery. You may shower, bathe, shave, or wash your hair. Tylenol may be taken for mild discomfort. Follow eye drop instruction sheet as reviewed.  Call your doctor if you experience significant pain, nausea, or vomiting, fever > 101 or other signs of infection. [phone] or [phone] Specific instructions:  Follow-up Information    Birder Robson, MD Follow up.   Specialty: Ophthalmology Why: 11-03-18 @ 11:05 am  Contact information: [address] [place] [address] [phone]

## 2018-11-02 NOTE — Op Note (Signed)
PREOPERATIVE DIAGNOSIS:  Nuclear sclerotic cataract of the left eye.   POSTOPERATIVE DIAGNOSIS:  Nuclear sclerotic cataract of the left eye.   OPERATIVE PROCEDURE: Procedure(s): CATARACT EXTRACTION PHACO AND INTRAOCULAR LENS PLACEMENT ([place]), LEFT   SURGEON:  Birder Robson, MD.   ANESTHESIA:  Anesthesiologist: Alphonsus Sias, MD CRNA: Willette Alma, CRNA  1.      Managed anesthesia care. 2.     0.47ml of Shugarcaine was instilled following the paracentesis   COMPLICATIONS:  None.   TECHNIQUE:   Stop and chop   DESCRIPTION OF PROCEDURE:  The patient was examined and consented in the preoperative holding area where the aforementioned topical anesthesia was applied to the left eye and then brought back to the Operating Room where the left eye was prepped and draped in the usual sterile ophthalmic fashion and a lid speculum was placed. A paracentesis was created with the side port blade and the anterior chamber was filled with viscoelastic. A near clear corneal incision was performed with the steel keratome. A continuous curvilinear capsulorrhexis was performed with a cystotome followed by the capsulorrhexis forceps. Hydrodissection and hydrodelineation were carried out with BSS on a blunt cannula. The lens was removed in a stop and chop  technique and the remaining cortical material was removed with the irrigation-aspiration handpiece. The capsular bag was inflated with viscoelastic and the Technis ZCB00 lens was placed in the capsular bag without complication. The remaining viscoelastic was removed from the eye with the irrigation-aspiration handpiece. The wounds were hydrated. The anterior chamber was flushed with Miostat and the eye was inflated to physiologic pressure. 0.16ml Vigamox was placed in the anterior chamber. The wounds were found to be water tight. The eye was dressed with Vigamox. The patient was given protective glasses to wear throughout the day and a shield with which to  sleep tonight. The patient was also given drops with which to begin a drop regimen today and will follow-up with me in one day. Implant Name Type Inv. Item Serial No. Manufacturer Lot No. LRB No. Used Action  LENS IOL DIOP 17.0 - [id number] Intraocular Lens LENS IOL DIOP 17.0 [id number] [place]  Left 1 Implanted    Procedure(s) with comments: CATARACT EXTRACTION PHACO AND INTRAOCULAR LENS PLACEMENT ([facility]), LEFT (Left) - [place]  01:10 CDE 11.52 Fluid pack lot # [id number]  Electronically signed: Birder Robson 11/02/2018 8:55 AM

## 2018-11-02 NOTE — Transfer of Care (Signed)
Immediate Anesthesia Transfer of Care Note  Patient: Erik [name].  Procedure(s) Performed: CATARACT EXTRACTION PHACO AND INTRAOCULAR LENS PLACEMENT ([facility]), LEFT (Left Eye)  Patient Location: PACU  Anesthesia Type:MAC  Level of Consciousness: awake, alert  and oriented  Airway & Oxygen Therapy: Patient Spontanous Breathing  Post-op Assessment: Report given to RN and Post -op Vital signs reviewed and stable  Post vital signs: Reviewed and stable  Last Vitals:  Vitals Value Taken Time  BP    Temp 36.5 C 11/02/18 0855  Pulse 71 11/02/18 0855  Resp 18 11/02/18 0855  SpO2 100 % 11/02/18 0855    Last Pain:  Vitals:   11/02/18 0855  TempSrc: Temporal  PainSc: 0-No pain         Complications: No apparent anesthesia complications

## 2018-11-08 DIAGNOSIS — M6281 Muscle weakness (generalized): Secondary | ICD-10-CM | POA: Diagnosis not present

## 2018-11-08 DIAGNOSIS — R262 Difficulty in walking, not elsewhere classified: Secondary | ICD-10-CM | POA: Diagnosis not present

## 2018-11-13 DIAGNOSIS — M6281 Muscle weakness (generalized): Secondary | ICD-10-CM | POA: Diagnosis not present

## 2018-11-13 DIAGNOSIS — R262 Difficulty in walking, not elsewhere classified: Secondary | ICD-10-CM | POA: Diagnosis not present

## 2018-11-16 DIAGNOSIS — M6281 Muscle weakness (generalized): Secondary | ICD-10-CM | POA: Diagnosis not present

## 2018-11-16 DIAGNOSIS — R262 Difficulty in walking, not elsewhere classified: Secondary | ICD-10-CM | POA: Diagnosis not present

## 2018-11-22 DIAGNOSIS — M6281 Muscle weakness (generalized): Secondary | ICD-10-CM | POA: Diagnosis not present

## 2018-11-22 DIAGNOSIS — R262 Difficulty in walking, not elsewhere classified: Secondary | ICD-10-CM | POA: Diagnosis not present

## 2018-12-05 ENCOUNTER — Other Ambulatory Visit: Payer: Self-pay | Admitting: Urology

## 2018-12-05 ENCOUNTER — Encounter: Payer: Self-pay | Admitting: Nurse Practitioner

## 2018-12-05 ENCOUNTER — Ambulatory Visit (INDEPENDENT_AMBULATORY_CARE_PROVIDER_SITE_OTHER): Payer: Medicare Other | Admitting: Nurse Practitioner

## 2018-12-05 ENCOUNTER — Other Ambulatory Visit (INDEPENDENT_AMBULATORY_CARE_PROVIDER_SITE_OTHER): Payer: Medicare Other

## 2018-12-05 VITALS — BP 112/42 | HR 88 | Temp 98.7°F | Ht 66.0 in | Wt 134.0 lb

## 2018-12-05 DIAGNOSIS — R194 Change in bowel habit: Secondary | ICD-10-CM | POA: Diagnosis not present

## 2018-12-05 DIAGNOSIS — I2510 Atherosclerotic heart disease of native coronary artery without angina pectoris: Secondary | ICD-10-CM

## 2018-12-05 LAB — CBC
HCT: 38.6 % — ABNORMAL LOW (ref 39.0–52.0)
Hemoglobin: 12.8 g/dL — ABNORMAL LOW (ref 13.0–17.0)
MCHC: 33.1 g/dL (ref 30.0–36.0)
MCV: 86.9 fl (ref 78.0–100.0)
Platelets: 271 10*3/uL (ref 150.0–400.0)
RBC: 4.44 Mil/uL (ref 4.22–5.81)
RDW: 14.5 % (ref 11.5–15.5)
WBC: 7.1 10*3/uL (ref 4.0–10.5)

## 2018-12-05 LAB — IBC + FERRITIN
Ferritin: 30.9 ng/mL (ref 22.0–322.0)
Iron: 125 ug/dL (ref 42–165)
Saturation Ratios: 36.6 % (ref 20.0–50.0)
Transferrin: 244 mg/dL (ref 212.0–360.0)

## 2018-12-05 NOTE — Progress Notes (Signed)
Chief Complaint:   Black stools   IMPRESSION and PLAN:    51.  78 year old male with complaints of chronic loose, black stool.  Evaluated for the same symptoms in May of this year but stools were heme positive at that time. Nothing on endoscopic workup to explain symptoms / occult blood.  DRE today is normal with flecks of light brown Hemoccult negative.  Not clear why stools are black at home, they were black before we ever started the iron.  -No active bleeding. Check CBC -Also repeat iron studies  2.  Hx of H. pylori gastritis in May 2020 (treated with Pylera).  -check breath test for eradication (off H2 blocker and PPI x 14 days)  3. Hx of GERD. On BID PPI and BID H2 blocker.  -I think we can deescalate regimen, especially if hgb I fine. Would try PPI in am and H2 blocker in evening. Will make the change after I see labs.   HPI:     Patient is a 78 yo male with PMH significant for CAD/PCI on Plavix, PVD status post carotid endarterectomy, CHF, hypertension and hyperlipidemia.  He has a history of diverticulosis and adenomatous colon polyps.  Patient was last seen May of this year for evaluation of dark stool, mild weight loss, abdominal discomfort / nausea when leaning forward. CT AP with contrast negative for anything acute . Stools were heme positive. He was iron deficient so we started iron.  He subsequently underwent EGD and colonoscopy with findings of H.pylori gastritis, 2 cm hiatal hernia, low grade Schatzki's, 3 small sessile colon polyps, left sided diverticulosis and internal hemorrhoids. H.pylori treated with a course of Pylera. No recall colonoscopy recommended due to age.   Mr Erik [name] is here with ongoing black, loose stools. This started a year or so ago and has never stopped. Stools were black before starting the iron. No Bismuth use. He still has mid abdominal discomfort but only when leaning over just as when tying shoe. He has EGD / colonoscopy reports with him  today, asks about some of the findings. He is taking Pepcid 20 mg BID and also the Protonix 40 mg BID. He has no GERD symptoms on current regimen. His weight is stable, actually up a few pounds.   Review of systems:     No chest pain, no SOB, no fevers, no urinary sx   Past Medical History:  Diagnosis Date  . Adenomatous colon polyp   . Aortic aneurysm ([place])   . BPH (benign prostatic hypertrophy)   . Carotid artery stenosis    a. Bilateral CEA  . Cataract   . CHF (congestive heart failure) ([place])   . Chronic systolic heart failure (HCC)    a. EF 20-25%, mild LVH, mod HK, mid apicalanteroseptal myocardium, mild MR, LA mod dilated  . Collagen vascular disease ([place])   . COPD (chronic obstructive pulmonary disease) ([place])   . Coronary artery disease    a. LHC (08/2013): Lmain: short 30% distal, LAD: sml D1 & D2, 70% ostial D1, 95-99% LAD stenosis prox D2 LCx: sml/mod ramus subtot. occluded, 40% ostial set off lg OM1, 40% AV LCx after OM1, RCA: 90% prox (DES to RCA and prox LAD)  . Diverticulosis   . GERD (gastroesophageal reflux disease)   . Heart murmur   . History of colonic polyps   . Hyperlipidemia   . Hyperplastic colon polyp   . Hypertension   . Ischemic cardiomyopathy   .  RLS (restless legs syndrome)     Patient's surgical history, family medical history, social history, medications and allergies were all reviewed in Epic    Current Outpatient Medications  Medication Sig Dispense Refill  . acetaminophen (TYLENOL) 325 MG tablet Take 650 mg by mouth every 6 (six) hours as needed (for pain.).     Marland Kitchen aspirin EC 81 MG tablet Take 81 mg by mouth every evening.     . clopidogrel (PLAVIX) 75 MG tablet TAKE 1 TABLET BY MOUTH EVERY DAY (Patient taking differently: Take 75 mg by mouth daily. PLAVIX - Should not be held prior to cath procedure unless specifically ordered) 90 tablet 3  . diazepam (VALIUM) 5 MG tablet Take 1 by mouth 1 hour  pre-procedure with very light food. May bring 2nd  tablet to appointment. 2 tablet 0  . ezetimibe-simvastatin (VYTORIN) 10-40 MG tablet TAKE 1 TABLET AT BEDTIME BY MOUTH. 90 tablet 3  . famotidine (PEPCID) 20 MG tablet TAKE 1 TABLET (20 MG TOTAL) BY MOUTH 2 (TWO) TIMES DAILY. (Patient taking differently: Take 20 mg by mouth 2 (two) times daily. ) 60 tablet 5  . ferrous sulfate 325 (65 FE) MG tablet TAKE 1 TABLET BY MOUTH EVERY DAY WITH BREAKFAST (Patient taking differently: Take 325 mg by mouth daily with breakfast. ) 90 tablet 0  . furosemide (LASIX) 20 MG tablet TAKE 1 TABLET BY MOUTH EVERY DAY (Patient taking differently: Take 20 mg by mouth daily. ) 90 tablet 1  . lisinopril (ZESTRIL) 5 MG tablet Take 1 tablet (5 mg total) by mouth daily. 90 tablet 2  . metoprolol succinate (TOPROL-XL) 50 MG 24 hr tablet TAKE 1 TABLET (50 MG TOTAL) BY MOUTH DAILY. TAKE WITH OR IMMEDIATELY FOLLOWING A MEAL. 90 tablet 3  . nitroGLYCERIN (NITROSTAT) 0.4 MG SL tablet Place 1 tablet (0.4 mg total) under the tongue every 5 (five) minutes x 3 doses as needed for chest pain. 30 tablet 3  . omega-3 acid ethyl esters (LOVAZA) 1 g capsule Take 1,000 mg by mouth at bedtime. Reported on 06/05/2015    . pantoprazole (PROTONIX) 40 MG tablet Take 1 tablet (40 mg total) by mouth 2 (two) times daily before a meal. 60 tablet 3  . rOPINIRole (REQUIP) 0.5 MG tablet 2 tabs daily, Dose may be further titrated upward in 0.5 mg increments every week until effective or reaching a daily dose of 3 mg. (Patient taking differently: Take 1 mg by mouth 2 (two) times a day. 2 tabs twice daily, Dose may be further titrated upward in 0.5 mg increments every week until effective or reaching a daily dose of 3 mg.) 60 tablet 11  . tamsulosin (FLOMAX) 0.4 MG CAPS capsule TAKE 1 CAPSULE BY MOUTH EVERY DAY (Patient taking differently: Take 0.4 mg by mouth daily. ) 90 capsule 0   No current facility-administered medications for this visit.     Physical Exam:     BP (!) 112/42   Pulse 88   Temp 98.7  F (37.1 C)   Ht 5\' 6"  (1.676 m)   Wt 134 lb (60.8 kg)   BMI 21.63 kg/m   GENERAL:  Pleasant male in NAD PSYCH: : Cooperative, normal affect EENT:  conjunctiva pink, mucous membranes moist, neck supple without masses CARDIAC:  RRR,  no peripheral edema PULM: Normal respiratory effort, lungs CTA bilaterally, no wheezing ABDOMEN:  Nondistended, soft, nontender. No obvious masses, no hepatomegaly,  normal bowel sounds RECTAL: a few flecks of light brown stool in  vault. Hemoccult negative.  SKIN:  turgor, no lesions seen Musculoskeletal:  Normal muscle tone, normal strength NEURO: Alert and oriented x 3, no focal neurologic deficits   Tye Savoy , NP 12/05/2018, 1:18 PM

## 2018-12-05 NOTE — Patient Instructions (Addendum)
If you are age 77 or older, your body mass index should be between 23-30. Your Body mass index is 21.63 kg/m. If this is out of the aforementioned range listed, please consider follow up with your Primary Care Provider.  If you are age 11 or younger, your body mass index should be between 19-25. Your Body mass index is 21.63 kg/m. If this is out of the aformentioned range listed, please consider follow up with your Primary Care Provider.   Your provider has requested that you go to the basement level for lab work before leaving today. Press "B" on the elevator. The lab is located at the first door on the left as you exit the elevator.  HOLD PROTONIX/PEPCID FOR 14 DAYS, THEN GET H PYLORI BREATH TEST AT [facility].  Take order for H Pylori test to [facility] - [address] [place], [place].  We will call you with results.  Thank you for choosing me and [facility].   Tye Savoy, NP

## 2018-12-06 DIAGNOSIS — R262 Difficulty in walking, not elsewhere classified: Secondary | ICD-10-CM | POA: Diagnosis not present

## 2018-12-06 DIAGNOSIS — M6281 Muscle weakness (generalized): Secondary | ICD-10-CM | POA: Diagnosis not present

## 2018-12-08 ENCOUNTER — Telehealth: Payer: Self-pay | Admitting: Family Medicine

## 2018-12-08 MED ORDER — TAMSULOSIN HCL 0.4 MG PO CAPS: ORAL_CAPSULE | ORAL | 1 refills | Status: DC

## 2018-12-08 MED ORDER — ROPINIROLE HCL 1 MG PO TABS: 1.0000 mg | ORAL_TABLET | Freq: Two times a day (BID) | ORAL | 1 refills | Status: DC

## 2018-12-08 NOTE — Telephone Encounter (Signed)
Spoke with Erik [name].  He is currently taking Ropinirole 0.5 mg 2 tablets twice a day.  Would like to have 1 mg tablets sent to pharmacy so he only has to take 1 tablet twice a day.  Ok to change to 1 mg tablets?

## 2018-12-08 NOTE — Telephone Encounter (Signed)
Best number [phone] cell [phone]  Spouse (carolyn) called pt needs to get a refill on   ropinirole she stated he takes 4 pills of this  tamsulosin  Pt is out of ropinirole  [facility]  PLease let pt know when this has been called in

## 2018-12-14 NOTE — Progress Notes (Signed)
Addendum: Reviewed and agree with assessment and management plan. Kemet Nijjar M, MD  

## 2018-12-18 ENCOUNTER — Telehealth: Payer: Self-pay | Admitting: Nurse Practitioner

## 2018-12-18 NOTE — Telephone Encounter (Signed)
Pt's wife called to inform that [facility] at [address] Did not have test kits for H-Pylori for pt this morning. They said that they have to order it. Pt has been off of medications for 14 days and would like to have test asap. He wants to know if he can go to a different lab.

## 2018-12-19 NOTE — Telephone Encounter (Signed)
Pt states that he called [facility] and they still do not have testing kit in any of their locations. Pt would like to speak with you.

## 2018-12-19 NOTE — Telephone Encounter (Signed)
Patient will go back to the testing site to see if the kits have arrived. He last talked to the lab on 12/05/18.

## 2018-12-19 NOTE — Telephone Encounter (Signed)
I recommend H. pylori stool antigen performed after a 5-day break from PPI

## 2018-12-19 NOTE — Telephone Encounter (Signed)
Dr Hilarie Fredrickson there is a back order on the urea breath test kits. At this time they cannot be done. Please advise on the testing for eradication of H Pylori for this patient.

## 2018-12-20 ENCOUNTER — Other Ambulatory Visit: Payer: Medicare Other

## 2018-12-20 ENCOUNTER — Other Ambulatory Visit: Payer: Self-pay

## 2018-12-20 DIAGNOSIS — A048 Other specified bacterial intestinal infections: Secondary | ICD-10-CM

## 2018-12-20 NOTE — Telephone Encounter (Signed)
Patient is advised. Confirmed he has been off the PPI for more than 5 days. He states off for 2 weeks. He  Agrees to this plan of care.

## 2018-12-21 ENCOUNTER — Other Ambulatory Visit: Payer: Medicare Other

## 2018-12-21 ENCOUNTER — Other Ambulatory Visit: Payer: Self-pay | Admitting: Internal Medicine

## 2018-12-21 DIAGNOSIS — A048 Other specified bacterial intestinal infections: Secondary | ICD-10-CM | POA: Diagnosis not present

## 2018-12-23 LAB — H. PYLORI ANTIGEN, STOOL: H pylori Ag, Stl: NEGATIVE

## 2018-12-27 ENCOUNTER — Other Ambulatory Visit (HOSPITAL_COMMUNITY): Payer: Self-pay | Admitting: Cardiology

## 2018-12-28 ENCOUNTER — Other Ambulatory Visit: Payer: Self-pay

## 2018-12-28 DIAGNOSIS — I6523 Occlusion and stenosis of bilateral carotid arteries: Secondary | ICD-10-CM

## 2018-12-29 ENCOUNTER — Telehealth (HOSPITAL_COMMUNITY): Payer: Self-pay | Admitting: *Deleted

## 2018-12-29 NOTE — Telephone Encounter (Signed)

## 2019-01-01 ENCOUNTER — Encounter: Payer: Self-pay | Admitting: Family

## 2019-01-01 ENCOUNTER — Other Ambulatory Visit: Payer: Self-pay

## 2019-01-01 ENCOUNTER — Ambulatory Visit (INDEPENDENT_AMBULATORY_CARE_PROVIDER_SITE_OTHER): Payer: Medicare Other | Admitting: Family

## 2019-01-01 ENCOUNTER — Ambulatory Visit (HOSPITAL_COMMUNITY)
Admission: RE | Admit: 2019-01-01 | Discharge: 2019-01-01 | Disposition: A | Discharge status: Home / Self Care | Payer: Medicare Other | Source: Ambulatory Visit | Attending: Family | Admitting: Family

## 2019-01-01 VITALS — BP 116/57 | HR 80 | Temp 97.3°F | Resp 16 | Ht 66.0 in | Wt 128.0 lb

## 2019-01-01 DIAGNOSIS — Z9889 Other specified postprocedural states: Secondary | ICD-10-CM | POA: Diagnosis not present

## 2019-01-01 DIAGNOSIS — I6523 Occlusion and stenosis of bilateral carotid arteries: Secondary | ICD-10-CM

## 2019-01-01 DIAGNOSIS — R1010 Upper abdominal pain, unspecified: Secondary | ICD-10-CM

## 2019-01-01 DIAGNOSIS — I714 Abdominal aortic aneurysm, without rupture, unspecified: Secondary | ICD-10-CM

## 2019-01-01 DIAGNOSIS — R634 Abnormal weight loss: Secondary | ICD-10-CM | POA: Diagnosis not present

## 2019-01-01 DIAGNOSIS — F17200 Nicotine dependence, unspecified, uncomplicated: Secondary | ICD-10-CM | POA: Diagnosis not present

## 2019-01-01 DIAGNOSIS — Z95828 Presence of other vascular implants and grafts: Secondary | ICD-10-CM | POA: Diagnosis not present

## 2019-01-01 NOTE — Patient Instructions (Addendum)
Before your next abdominal ultrasound:  Avoid gas forming foods and beverages the day before the test.   Take two Extra-Strength Gas-X capsules at bedtime the night before the test. Take another two Extra-Strength Gas-X capsules in the middle of the night if you get up to the restroom, if not, first thing in the morning with water.  Do not chew gum.      Steps to Quit Smoking Smoking tobacco is the leading cause of preventable death. It can affect almost every organ in the body. Smoking puts you and people around you at risk for many serious, long-lasting (chronic) diseases. Quitting smoking can be hard, but it is one of the best things that you can do for your health. It is never too late to quit. How do I get ready to quit? When you decide to quit smoking, make a plan to help you succeed. Before you quit:  Pick a date to quit. Set a date within the next 2 weeks to give you time to prepare.  Write down the reasons why you are quitting. Keep this list in places where you will see it often.  Tell your family, friends, and co-workers that you are quitting. Their support is important.  Talk with your doctor about the choices that may help you quit.  Find out if your health insurance will pay for these treatments.  Know the people, places, things, and activities that make you want to smoke (triggers). Avoid them. What first steps can I take to quit smoking?  Throw away all cigarettes at home, at work, and in your car.  Throw away the things that you use when you smoke, such as ashtrays and lighters.  Clean your car. Make sure to empty the ashtray.  Clean your home, including curtains and carpets. What can I do to help me quit smoking? Talk with your doctor about taking medicines and seeing a counselor at the same time. You are more likely to succeed when you do both.  If you are pregnant or breastfeeding, talk with your doctor about counseling or other ways to quit smoking. Do not  take medicine to help you quit smoking unless your doctor tells you to do so. To quit smoking: Quit right away  Quit smoking totally, instead of slowly cutting back on how much you smoke over a period of time.  Go to counseling. You are more likely to quit if you go to counseling sessions regularly. Take medicine You may take medicines to help you quit. Some medicines need a prescription, and some you can buy over-the-counter. Some medicines may contain a drug called nicotine to replace the nicotine in cigarettes. Medicines may:  Help you to stop having the desire to smoke (cravings).  Help to stop the problems that come when you stop smoking (withdrawal symptoms). Your doctor may ask you to use:  Nicotine patches, gum, or lozenges.  Nicotine inhalers or sprays.  Non-nicotine medicine that is taken by mouth. Find resources Find resources and other ways to help you quit smoking and remain smoke-free after you quit. These resources are most helpful when you use them often. They include:  Online chats with a Social worker.  Phone quitlines.  Printed Furniture conservator/restorer.  Support groups or group counseling.  Text messaging programs.  Mobile phone apps. Use apps on your mobile phone or tablet that can help you stick to your quit plan. There are many free apps for mobile phones and tablets as well as websites. Examples include  Quit Guide from the [employer] and smokefree.gov  What things can I do to make it easier to quit?   Talk to your family and friends. Ask them to support and encourage you.  Call a phone quitline (1-800-QUIT-NOW), reach out to support groups, or work with a Social worker.  Ask people who smoke to not smoke around you.  Avoid places that make you want to smoke, such as: ? Bars. ? Parties. ? Smoke-break areas at work.  Spend time with people who do not smoke.  Lower the stress in your life. Stress can make you want to smoke. Try these things to help your  stress: ? Getting regular exercise. ? Doing deep-breathing exercises. ? Doing yoga. ? Meditating. ? Doing a body scan. To do this, close your eyes, focus on one area of your body at a time from head to toe. Notice which parts of your body are tense. Try to relax the muscles in those areas. How will I feel when I quit smoking? Day 1 to 3 weeks Within the first 24 hours, you may start to have some problems that come from quitting tobacco. These problems are very bad 2-3 days after you quit, but they do not often last for more than 2-3 weeks. You may get these symptoms:  Mood swings.  Feeling restless, nervous, angry, or annoyed.  Trouble concentrating.  Dizziness.  Strong desire for high-sugar foods and nicotine.  Weight gain.  Trouble pooping (constipation).  Feeling like you may vomit (nausea).  Coughing or a sore throat.  Changes in how the medicines that you take for other issues work in your body.  Depression.  Trouble sleeping (insomnia). Week 3 and afterward After the first 2-3 weeks of quitting, you may start to notice more positive results, such as:  Better sense of smell and taste.  Less coughing and sore throat.  Slower heart rate.  Lower blood pressure.  Clearer skin.  Better breathing.  Fewer sick days. Quitting smoking can be hard. Do not give up if you fail the first time. Some people need to try a few times before they succeed. Do your best to stick to your quit plan, and talk with your doctor if you have any questions or concerns. Summary  Smoking tobacco is the leading cause of preventable death. Quitting smoking can be hard, but it is one of the best things that you can do for your health.  When you decide to quit smoking, make a plan to help you succeed.  Quit smoking right away, not slowly over a period of time.  When you start quitting, seek help from your doctor, family, or friends. This information is not intended to replace advice  given to you by your health care provider. Make sure you discuss any questions you have with your health care provider. Document Released: 12/26/2008 Document Revised: 05/19/2018 Document Reviewed: 05/20/2018 Elsevier Patient Education  2020 [employer].    Stroke Prevention Some medical conditions and lifestyle choices can lead to a higher risk for a stroke. You can help to prevent a stroke by making nutrition, lifestyle, and other changes. What nutrition changes can be made?   Eat healthy foods. ? Choose foods that are high in fiber. These include:  Fresh fruits.  Fresh vegetables.  Whole grains. ? Eat at least 5 or more servings of fruits and vegetables each day. Try to fill half of your plate at each meal with fruits and vegetables. ? Choose lean protein foods. These include:  Lowfat (lean) cuts of meat.  Chicken without skin.  Fish.  Tofu.  Beans.  Nuts. ? Eat low-fat dairy products. ? Avoid foods that:  Are high in salt (sodium).  Have saturated fat.  Have trans fat.  Have cholesterol.  Are processed.  Are premade.  Follow eating guidelines as told by your doctor. These may include: ? Reducing how many calories you eat and drink each day. ? Limiting how much salt you eat or drink each day to 1,500 milligrams (mg). ? Using only healthy fats for cooking. These include:  Olive oil.  Canola oil.  Sunflower oil. ? Counting how many carbohydrates you eat and drink each day. What lifestyle changes can be made?  Try to stay at a healthy weight. Talk to your doctor about what a good weight is for you.  Get at least 30 minutes of moderate physical activity at least 5 days a week. This can include: ? Fast walking. ? Biking. ? Swimming.  Do not use any products that have nicotine or tobacco. This includes cigarettes and e-cigarettes. If you need help quitting, ask your doctor. Avoid being around tobacco smoke in general.  Limit how much alcohol you  drink to no more than 1 drink a day for nonpregnant women and 2 drinks a day for men. One drink equals 12 oz of beer, 5 oz of wine, or 1 oz of hard liquor.  Do not use drugs.  Avoid taking birth control pills. Talk to your doctor about the risks of taking birth control pills if: ? You are over 48 years old. ? You smoke. ? You get migraines. ? You have had a blood clot. What other changes can be made?  Manage your cholesterol. ? It is important to eat a healthy diet. ? If your cholesterol cannot be managed through your diet, you may also need to take medicines. Take medicines as told by your doctor.  Manage your diabetes. ? It is important to eat a healthy diet and to exercise regularly. ? If your blood sugar cannot be managed through diet and exercise, you may need to take medicines. Take medicines as told by your doctor.  Control your high blood pressure (hypertension). ? Try to keep your blood pressure below 130/80. This can help lower your risk of stroke. ? It is important to eat a healthy diet and to exercise regularly. ? If your blood pressure cannot be managed through diet and exercise, you may need to take medicines. Take medicines as told by your doctor. ? Ask your doctor if you should check your blood pressure at home. ? Have your blood pressure checked every year. Do this even if your blood pressure is normal.  Talk to your doctor about getting checked for a sleep disorder. Signs of this can include: ? Snoring a lot. ? Feeling very tired.  Take over-the-counter and prescription medicines only as told by your doctor. These may include aspirin or blood thinners (antiplatelets or anticoagulants).  Make sure that any other medical conditions you have are managed. Where to find more information  American Stroke Association: www.strokeassociation.org  National Stroke Association: www.stroke.org Get help right away if:  You have any symptoms of stroke. "BE FAST" is an easy  way to remember the main warning signs: ? B - Balance. Signs are dizziness, sudden trouble walking, or loss of balance. ? E - Eyes. Signs are trouble seeing or a sudden change in how you see. ? F - Face. Signs are sudden  weakness or loss of feeling of the face, or the face or eyelid drooping on one side. ? A - Arms. Signs are weakness or loss of feeling in an arm. This happens suddenly and usually on one side of the body. ? S - Speech. Signs are sudden trouble speaking, slurred speech, or trouble understanding what people say. ? T - Time. Time to call emergency services. Write down what time symptoms started.  You have other signs of stroke, such as: ? A sudden, very bad headache with no known cause. ? Feeling sick to your stomach (nausea). ? Throwing up (vomiting). ? Jerky movements you cannot control (seizure). These symptoms may represent a serious problem that is an emergency. Do not wait to see if the symptoms will go away. Get medical help right away. Call your local emergency services (911 in the [place] Do not drive yourself to the hospital. Summary  You can prevent a stroke by eating healthy, exercising, not smoking, drinking less alcohol, and treating other health problems, such as diabetes, high blood pressure, or high cholesterol.  Do not use any products that contain nicotine or tobacco, such as cigarettes and e-cigarettes.  Get help right away if you have any signs or symptoms of a stroke. This information is not intended to replace advice given to you by your health care provider. Make sure you discuss any questions you have with your health care provider. Document Released: 08/31/2011 Document Revised: 04/27/2018 Document Reviewed: 06/02/2016 Elsevier Patient Education  2020 [employer].

## 2019-01-01 NOTE — Progress Notes (Signed)
Chief Complaint: Follow up Extracranial Carotid Artery Stenosis   History of Present Illness  Erik [name]. is a 78 y.o. male who is s/p bilateral carotid endarterectomies in February and March of 2010 by Dr. Scot Dock.  He also has a AAA diagnosed October 2015 after prominent aortic pulse detected.  He is s/p2-vessel FEVAR with a Z-Fen deviceonMarch 15,2017 by Dr. 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 Pt states he was hospitalizedrethis for 3 days.  He returns today for routine surveillanceof hisextracranialcarotid artery stenosisand follow up. The patient has no history of TIA or stroke symptoms. Specifically he denies any history of amaurosis fugax or monocular blindness, unilateral facial drooping, hemiplegia, or receptive or expressive aphasia.  He denies steal symptoms in either hand or arm.  He reports bilateral hip to lower leg heavy feeling after walking briskly up an incline about 1/2 mile, left more so than right, sx's do not occur walking on flat surface. He denies any known back problems.   Pt states Dr. Sammuel Hines at [facility] had been checking his AAA EVAR, but asked that we do this now.   The patient states his brother has a AAA also.   He had cardiac stents placed July, 2015, states he may have had an MI.  He is being evaluated by Dr. Hilarie Fredrickson for dark stools and frequent bowel movements.  He states he has stomach pain all day, states the pain is no worse after eating, but states he does not feel like eating, has no appetite, and has lost weight. If he eats a full meal, he has to move his bowels.  He was treated for a gastric infection and he states that his BM's are less often, but the stomach pain has not improved.   He is having ESI's for back pain.    Diabetic: No Tobacco use: current smoker, quit June, 2015, resumed early 2020, 1 ppd  Pt meds include:  Statin : Yes  ASA: Yes   Other anticoagulants/antiplatelets:Plavixprescribed by his cardiologist, Dr. Aundra Dubin, for CAD    Past Medical History:  Diagnosis Date  . Adenomatous colon polyp   . Aortic aneurysm ([place])   . BPH (benign prostatic hypertrophy)   . Carotid artery stenosis    a. Bilateral CEA  . Cataract   . CHF (congestive heart failure) ([place])   . Chronic systolic heart failure (HCC)    a. EF 20-25%, mild LVH, mod HK, mid apicalanteroseptal myocardium, mild MR, LA mod dilated  . Collagen vascular disease ([place])   . COPD (chronic obstructive pulmonary disease) ([place])   . Coronary artery disease    a. LHC (08/2013): Lmain: short 30% distal, LAD: sml D1 & D2, 70% ostial D1, 95-99% LAD stenosis prox D2 LCx: sml/mod ramus subtot. occluded, 40% ostial set off lg OM1, 40% AV LCx after OM1, RCA: 90% prox (DES to RCA and prox LAD)  . Diverticulosis   . GERD (gastroesophageal reflux disease)   . Heart murmur   . History of colonic polyps   . Hyperlipidemia   . Hyperplastic colon polyp   . Hypertension   . Ischemic cardiomyopathy   . RLS (restless legs syndrome)     Social History Social History   Tobacco Use  . Smoking status: Current Every Day Smoker    Packs/day: 1.00    Years: 50.00    Pack years: 50.00    Types: Cigarettes    Last attempt to quit: 08/27/2013  Years since quitting: 5.3  . Smokeless tobacco: Former Systems developer    Quit date: 09/10/2013  . Tobacco comment: off and on since age 89  Substance Use Topics  . Alcohol use: No  . Drug use: No    Family History Family History  Problem Relation Age of Onset  . Heart disease Brother   . Hyperlipidemia Brother   . Pancreatic cancer Brother   . Hypertension Mother   . Cancer Father        unknown type; sounds GI  . Hyperlipidemia Brother   . Hyperlipidemia Brother   . Heart attack Brother   . Hyperlipidemia Brother   . Multiple sclerosis Brother   . Kidney disease Neg Hx   . Prostate cancer Neg Hx   . Colon cancer Neg Hx   .  Esophageal cancer Neg Hx   . Rectal cancer Neg Hx   . Stomach cancer Neg Hx     Surgical History Past Surgical History:  Procedure Laterality Date  . cardiac stents  09-2013  . CAROTID ENDARTERECTOMY  04/17/2008   right  . CAROTID ENDARTERECTOMY  05/30/08   Left  . CATARACT EXTRACTION W/PHACO Right 10/05/2018   Procedure: CATARACT EXTRACTION PHACO AND INTRAOCULAR LENS PLACEMENT ([place]), RIGHT;  Surgeon: Birder Robson, MD;  Location: [facility] ORS;  Service: Ophthalmology;  Laterality: Right;  [place] 01:06.4 cde 12.79 Fluid Pack Lot # [id number]  . CATARACT EXTRACTION W/PHACO Left 11/02/2018   Procedure: CATARACT EXTRACTION PHACO AND INTRAOCULAR LENS PLACEMENT ([place]), LEFT;  Surgeon: Birder Robson, MD;  Location: [facility] ORS;  Service: Ophthalmology;  Laterality: Left;  [place]  01:10 CDE 11.52 Fluid pack lot # [id number]  . CHOLECYSTECTOMY     Gall Bladder  . CORONARY ANGIOGRAM  09/11/2013   Procedure: CORONARY ANGIOGRAM;  Surgeon: Troy Sine, MD;  Location: [facility] CATH LAB;  Service: Cardiovascular;;  . CORONARY ANGIOPLASTY     STENTS  . HERNIA REPAIR    . lower aorta aneurysm  05/26/2015   [facility]  . PERCUTANEOUS STENT INTERVENTION  09/11/2013   Procedure: PERCUTANEOUS STENT INTERVENTION;  Surgeon: Troy Sine, MD;  Location[facility] C[facility] LAB;  Service: Cardiovascular;;  DES Prox RCA 3.5x15 xience     Allergies  Allergen Reactions  . Codeine Swelling    throat swells    Current Outpatient Medications  Medication Sig Dispense Refill  . acetaminophen (TYLENOL) 325 MG tablet Take 650 mg by mouth every 6 (six) hours as needed (for pain.).     Marland Kitchen aspirin EC 81 MG tablet Take 81 mg by mouth every evening.     . clopidogrel (PLAVIX) 75 MG tablet TAKE 1 TABLET BY MOUTH EVERY DAY (Patient taking differently: Take 75 mg by mouth daily. PLAVIX - Should not be held prior to cath procedure unless specifically ordered) 90 tablet 3  . diazepam (VALIUM) 5 MG tablet Take 1 by mouth 1 hour  pre-procedure with very  light food. May bring 2nd tablet to appointment. 2 tablet 0  . ezetimibe-simvastatin (VYTORIN) 10-40 MG tablet TAKE 1 TABLET AT BEDTIME BY MOUTH. 90 tablet 3  . famotidine (PEPCID) 20 MG tablet TAKE 1 TABLET (20 MG TOTAL) BY MOUTH 2 (TWO) TIMES DAILY. (Patient taking differently: Take 20 mg by mouth 2 (two) times daily. ) 60 tablet 5  . ferrous sulfate 325 (65 FE) MG tablet TAKE 1 TABLET BY MOUTH EVERY DAY WITH BREAKFAST (Patient taking differently: Take 325 mg by mouth daily with breakfast. ) 90 tablet 0  .  furosemide (LASIX) 20 MG tablet TAKE 1 TABLET BY MOUTH EVERY DAY 90 tablet 1  . lisinopril (ZESTRIL) 5 MG tablet Take 1 tablet (5 mg total) by mouth daily. 90 tablet 2  . metoprolol succinate (TOPROL-XL) 50 MG 24 hr tablet TAKE 1 TABLET (50 MG TOTAL) BY MOUTH DAILY. TAKE WITH OR IMMEDIATELY FOLLOWING A MEAL. 90 tablet 3  . nitroGLYCERIN (NITROSTAT) 0.4 MG SL tablet Place 1 tablet (0.4 mg total) under the tongue every 5 (five) minutes x 3 doses as needed for chest pain. 30 tablet 3  . omega-3 acid ethyl esters (LOVAZA) 1 g capsule Take 1,000 mg by mouth at bedtime. Reported on 06/05/2015    . pantoprazole (PROTONIX) 40 MG tablet TAKE 1 TABLET (40 MG TOTAL) BY MOUTH 2 (TWO) TIMES DAILY BEFORE A MEAL. 180 tablet 0  . rOPINIRole (REQUIP) 1 MG tablet Take 1 tablet (1 mg total) by mouth 2 (two) times daily. 180 tablet 1  . tamsulosin (FLOMAX) 0.4 MG CAPS capsule TAKE 1 CAPSULE BY MOUTH EVERY DAY 90 capsule 1   No current facility-administered medications for this visit.     Review of Systems : See HPI for pertinent positives and negatives.  Physical Examination  Vitals:   01/01/19 1113 01/01/19 1116  BP: (!) 112/53 (!) 116/57  Pulse: 80 80  Resp: 16   Temp: (!) 97.3 F (36.3 C)   TempSrc: Temporal   SpO2: 99%   Weight: 128 lb (58.1 kg)   Height: 5\' 6"  (1.676 m)    Body mass index is 20.66 kg/m.  General: WDWN thin male in NAD GAIT: normal Eyes: Pupils are equal HENT: No gross  abnormalities.  Pulmonary:  Respirations are non-labored, limited air movement in all fields, no rales or rhonchi, + inspiratory wheezes in all posterior fields Cardiac: regular rhythm, no detected murmur.  VASCULAR EXAM Carotid Bruits Right Left   positive negative     Abdominal aortic pulse is not palpable. Radial pulses are 2+ palpable and equal.                                                                                                                            LE Pulses Right Left       FEMORAL  3+ palpable  3+ palpable        POPLITEAL  2+ palpable   2+ palpable       POSTERIOR TIBIAL  3+ palpable   3+ palpable        DORSALIS PEDIS      ANTERIOR TIBIAL 1+ palpable  1+ palpable     Gastrointestinal: soft, nontender, BS WNL, no r/g, no palpable masses, liver margins palpated (pt is underweight). Musculoskeletal: no muscle atrophy/wasting. M/S 5/5 throughout, extremities without ischemic changes Skin: No rashes, no ulcers, no cellulitis.   Neurologic:  A&O X 3; appropriate affect, sensation is normal; speech is normal, CN 2-12 intact, pain and light touch intact in extremities, motor exam  as listed above. Psychiatric: Normal thought content, mood appropriate to clinical situation.    DATA  Carotid Duplex (01-01-19): Patent bilateral endarterectomy sites with no evidence of hyperplasia or restenosis. No significant stenosis of the bilateral ECA of CCA. Bilateral vertebral arteries are antegrade (normal). Bilateral subclavian arteries are multiphasic. No significant change in comparison to the exams on10/27/16,01-14-16, and 01-19-17.   CTA abd/pelvis 08-04-18 requested by Dr. Hilarie Fredrickson to evaluate abdominal pain: Lower chest: Unremarkable.  Hepatobiliary: No suspicious focal abnormality within the liver parenchyma. Gallbladder surgically absent. No intrahepatic or extrahepatic biliary dilation.  Pancreas: No focal mass lesion. No dilatation of the main  duct. No intraparenchymal cyst. No peripancreatic edema.  Spleen: No splenomegaly. No focal mass lesion.  Adrenals/Urinary Tract: Stable bilateral adrenal thickening without discrete nodule or mass. Right kidney unremarkable. 1 mm nonobstructing stone identified lower pole left kidney. No evidence for hydroureter. The urinary bladder appears normal for the degree of distention.  Stomach/Bowel: Proximal gastric wall appears thickened, but this is similar to prior and may reflect underdistention. Duodenum is normally positioned as is the ligament of Treitz. No small bowel wall thickening. No small bowel dilatation. The terminal ileum is normal. The appendix is normal. No gross colonic mass. No colonic wall thickening. Diverticular changes are noted in the left colon without evidence of diverticulitis.  Vascular/Lymphatic: Status post aortic endograft placement. Native aneurysm sac decreased compared to prior measuring 4.1 x 3.2 cm today compared to 5.3 x 5.5 cm previously. There is no gastrohepatic or hepatoduodenal ligament lymphadenopathy. No intraperitoneal or retroperitoneal lymphadenopathy. No pelvic sidewall lymphadenopathy.  Reproductive: Prostate gland is enlarged.  Other: No intraperitoneal free fluid.  Musculoskeletal: No worrisome lytic or sclerotic osseous abnormality.  IMPRESSION: 1. No acute findings in the abdomen or pelvis. Specifically, no findings to explain the patient's history of left lower quadrant pain. 2. Left colonic diverticulosis without diverticulitis. 3. Status post aortic endograft placement with interval decrease in size of the native aneurysm. 4. Prostatomegaly.      CTA chest/abd/pelvis at [facility] (03-17-16): VASCULAR FINDINGS:   Scattered atheromatous plaque and calcification throughout the aorta and its branch vessels, with irregular plaque worse at descending thoracic aorta. There is moderate narrowing of the left  proximal subclavian artery measuring approximately 50%, similar to prior exam.Brachiocephalic, bilateral common carotid arteries, bilateral subclavian arteries, and visualized proximal bilateral vertebral arteries are patent.    AORTA: Status post aortobiiliac endograft with fenestrations at bilateral renal arteries. Excluded aneurysm sac measures 4.6 x 3.9 cm (6:192), previously measuring 5.2 x 4.8 cm. No evidence of endoleak.   MESENTERIC arteries:Celiac artery is severely narrowed origin and poststenotic dilatation, unchanged. SMA artery is patent. 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 Replaced left hepatic artery from left gastric artery.   RENAL arteries: Bilateral single renal artery stents are patent.   ILIAC arteries: Bilateral common iliac artery stents are patent. Bilateral external and internal iliac arteries are patent with mild scattered atheromatous plaque and calcification.    FEMORAL arteries: The visualized bilateral common femoral artery, SFA, and PFA are patent. Scattered atheromatous plaque and calcification of the common femoral artery, SFA, and PSA. There is moderate narrowing of the right common femoral artery, measuring approximately 50%. There is mild narrowing of the left common femoral artery, measuring approximately 25-50%.   Assessment: Emilliano Derderian. is a 79 y.o. male who is s/p bilateral carotid endarterectomies in February and March of 2010. He has no history of stroke or TIA.  Carotid duplex  today shows patent bilateral endarterectomy sites with no evidence of hyperplasia or restenosis.  He isalsos/p2-vessel FEVAR with a Z-Fen deviceonMarch [id number] by Dr. 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 Sammuel Hines had been monitoring pt re this, pt requested that we monitor this now.   He has no history of stroke or TIA.  His pedal pulses are palpable.   He resumed smoking early in  2020 after having quit in 2015. Over 3 minutes was spent counseling patient re smoking cessation, and patient was given several free resources re smoking cessation.   Fortunately he does not have DM.  He is being evaluated by GI for abdominal pain, wight loss, and frequent dark stools. He denies post prandial abdominal pain, denies food fear. CTA abd/pelvis requested by Dr. Hilarie Fredrickson and performed in May 2020 did not mention any mesenteric artery stenosis; AAA stent graft was noted and AAA sac size decreased from 5.5 cm to 4.1 cm.    Follow up in the next few weeks with mesenteric duplex, carotid duplex in 18 months.  EVAR duplex in May of 2021.    I discussed in depth with the patient the nature of atherosclerosis, and emphasized the importance of maximal medical management including strict control of blood pressure, blood glucose, and lipid levels, obtaining regular exercise, and continued cessation of smoking.  The patient is aware that without maximal medical management the underlying atherosclerotic disease process will progress, limiting the benefit of any interventions. The patient was given information about stroke prevention and what symptoms should prompt the patient to seek immediate medical care. Thank you for allowing [place] to participate in this patient's care.  Clemon Chambers, RN, MSN, FNP-C [facility] Office: [phone]  Clinic Physician: Trula Slade on call  01/01/19 11:45 AM

## 2019-01-02 ENCOUNTER — Telehealth: Payer: Self-pay | Admitting: Internal Medicine

## 2019-01-02 NOTE — Telephone Encounter (Signed)
See result note.  

## 2019-01-08 ENCOUNTER — Other Ambulatory Visit: Payer: Self-pay | Admitting: Internal Medicine

## 2019-01-11 ENCOUNTER — Other Ambulatory Visit: Payer: Self-pay

## 2019-01-11 ENCOUNTER — Ambulatory Visit (INDEPENDENT_AMBULATORY_CARE_PROVIDER_SITE_OTHER): Payer: Medicare Other | Admitting: Physician Assistant

## 2019-01-11 ENCOUNTER — Encounter: Payer: Self-pay | Admitting: Physician Assistant

## 2019-01-11 DIAGNOSIS — R2689 Other abnormalities of gait and mobility: Secondary | ICD-10-CM

## 2019-01-11 DIAGNOSIS — M5441 Lumbago with sciatica, right side: Secondary | ICD-10-CM

## 2019-01-11 NOTE — Progress Notes (Signed)
HPI: Mr. Gieser returns today follow-up of his low back.  He did go to physical therapy.  He states while he was doing exercises and home exercise program he felt better splurges back in his gait.  His back overall is better he feels like shot made the biggest difference.  He really has not felt like exercising due to his decreased appetite.  He has had a decrease in weight from 137 pounds to 126 pounds since March.  Had chronic loose black stool and is being worked up by GI.  He states he just has no energy.  Review of systems: Please see HPI otherwise negative  Physical exam: General: Well-developed well-nourished male no acute distress mood and affect appropriate  Lower extremities 5 out of 5 strength throughout against resistance.  Negative straight leg raise.  No tenderness of the lower lumbar spine.  Good forward flexion.  Limited extension.  Ambulates without a cane.  Impression: Low back pain without radicular symptoms Gait and balance disturbance  Plan: Encouraged him to work on home exercise program as shown by therapy at least 3 times a week.  Follow-up with [place] on as-needed basis.  Questions encouraged and answered.

## 2019-01-17 ENCOUNTER — Other Ambulatory Visit: Payer: Self-pay

## 2019-01-17 DIAGNOSIS — R1010 Upper abdominal pain, unspecified: Secondary | ICD-10-CM

## 2019-01-24 ENCOUNTER — Ambulatory Visit (HOSPITAL_COMMUNITY)
Admission: RE | Admit: 2019-01-24 | Discharge: 2019-01-24 | Disposition: A | Discharge status: Home / Self Care | Payer: Medicare Other | Source: Ambulatory Visit | Attending: Family | Admitting: Family

## 2019-01-24 ENCOUNTER — Other Ambulatory Visit: Payer: Self-pay

## 2019-01-24 DIAGNOSIS — R1010 Upper abdominal pain, unspecified: Secondary | ICD-10-CM | POA: Diagnosis not present

## 2019-01-25 ENCOUNTER — Ambulatory Visit (INDEPENDENT_AMBULATORY_CARE_PROVIDER_SITE_OTHER): Payer: Medicare Other | Admitting: Family

## 2019-01-25 ENCOUNTER — Encounter: Payer: Self-pay | Admitting: Family

## 2019-01-25 DIAGNOSIS — I6523 Occlusion and stenosis of bilateral carotid arteries: Secondary | ICD-10-CM | POA: Diagnosis not present

## 2019-01-25 DIAGNOSIS — F17200 Nicotine dependence, unspecified, uncomplicated: Secondary | ICD-10-CM

## 2019-01-25 DIAGNOSIS — I714 Abdominal aortic aneurysm, without rupture, unspecified: Secondary | ICD-10-CM

## 2019-01-25 DIAGNOSIS — Z95828 Presence of other vascular implants and grafts: Secondary | ICD-10-CM

## 2019-01-25 DIAGNOSIS — Z9889 Other specified postprocedural states: Secondary | ICD-10-CM | POA: Diagnosis not present

## 2019-01-25 NOTE — Progress Notes (Signed)
Virtual Visit via Telephone Note   I connected with Erik [name]. on 01/25/2019 using the Doxy[employer]me by telephone and verified that I was speaking with the correct person using two identifiers. Patient was located at his home and accompanied by himself. I am located at the [facility] office/clinic.   The limitations of evaluation and management by telemedicine and the availability of in person appointments have been previously discussed with the patient and are documented in the patients chart. The patient expressed understanding and consented to proceed.  PCP: [name], MD  Chief Complaint: follow up mesenteric duplex, EVAR, and remote history of bilateral CEA.   History of Present Illness: Erik [name]. is a 78 y.o. male who is s/p bilateral carotid endarterectomies in February and March of 2010 by Dr. Scot Dock.  He also has a AAA diagnosed October 2015 after prominent aortic pulse detected.  He is s/p2-vessel FEVAR with a Z-Fen deviceonMarch 15,2017 by Dr. 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 Pt states he was hospitalizedrethis for 3 days.  He returns today for routine surveillanceof hisextracranialcarotid artery stenosisand follow up. The patient has no history of TIA or stroke symptoms. Specifically he denies any history of amaurosis fugax or monocular blindness, unilateral facial drooping, hemiplegia, or receptive or expressive aphasia.  He denies steal symptoms in either hand or arm.  He reports bilateral hip to lower leg heavy feeling after walking briskly up an incline about 1/2 mile, left more so than right, sx's do not occur walking on flat surface. He denies any known back problems.   Pt states [facility] Sam[facility]el [facility] at [facility] had been checking hisAAA EVAR, but asked that we do this now.   The patient states his brother has a AAA also.   He had cardiac stents placed July, 2015, states he may have had  an MI.  He is being evaluated by Dr. Hilarie Fredrickson for dark stools and frequent bowel movements.  He states he has stomach pain all day, states the pain is no worse after eating, but states he does not feel like eating, has no appetite, and has lost weight. If he eats a full meal, he has to move his bowels.  He was treated for a gastric infection and he states that his BM's are less often, but the stomach pain has not improved.   He is having ESI's for back pain.    Diabetic: No Tobacco use: current smoker, quit June, 2015, resumed early 2020, 1 ppd  Pt meds include:  Statin : Yes  ASA: Yes  Other anticoagulants/antiplatelets:Plavixprescribed by his cardiologist, Dr. Aundra Dubin, for CAD   Past Medical History:  Diagnosis Date  . Adenomatous colon polyp   . Aortic aneurysm ([place])   . BPH (benign prostatic hypertrophy)   . Carotid artery stenosis    a. Bilateral CEA  . Cataract   . CHF (congestive heart failure) ([place])   . Chronic systolic heart failure (HCC)    a. EF 20-25%, mild LVH, mod HK, mid apicalanteroseptal myocardium, mild MR, LA mod dilated  . Collagen vascular disease ([place])   . COPD (chronic obstructive pulmonary disease) ([place])   . Coronary artery disease    a. LHC (08/2013): Lmain: short 30% distal, LAD: sml D1 & D2, 70% ostial D1, 95-99% LAD stenosis prox D2 LCx: sml/mod ramus subtot. occluded, 40% ostial set off lg OM1, 40% AV LCx after OM1, RCA: 90% prox (DES to RCA and prox  LAD)  . Diverticulosis   . GERD (gastroesophageal reflux disease)   . Heart murmur   . History of colonic polyps   . Hyperlipidemia   . Hyperplastic colon polyp   . Hypertension   . Ischemic cardiomyopathy   . RLS (restless legs syndrome)     Past Surgical History:  Procedure Laterality Date  . cardiac stents  09-2013  . CAROTID ENDARTERECTOMY  04/17/2008   right  . CAROTID ENDARTERECTOMY  05/30/08   Left  . CATARACT EXTRACTION W/PHACO Right 10/05/2018   Procedure: CATARACT  EXTRACTION PHACO AND INTRAOCULAR LENS PLACEMENT ([place]), RIGHT;  Surgeon: Birder Robson, MD;  Location: [facility] ORS;  Service: Ophthalmology;  Laterality: Right;  [place] 01:06.4 cde 12.79 Fluid Pack Lot # [id number]  . CATARACT EXTRACTION W/PHACO Left 11/02/2018   Procedure: CATARACT EXTRACTION PHACO AND INTRAOCULAR LENS PLACEMENT ([place]), LEFT;  Surgeon: Birder Robson, MD;  Location: [facility] ORS;  Service: Ophthalmology;  Laterality: Left;  [place]  01:10 CDE 11.52 Fluid pack lot # [id number]  . CHOLECYSTECTOMY     Gall Bladder  . CORONARY ANGIOGRAM  09/11/2013   Procedure: CORONARY ANGIOGRAM;  Surgeon: Troy Sine, MD;  Location: [facility] CATH LAB;  Service: Cardiovascular;;  . CORONARY ANGIOPLASTY     STENTS  . HERNIA REPAIR    . lower aorta aneurysm  05/26/2015   [facility]  . PERCUTANEOUS STENT INTERVENTION  09/11/2013   Procedure: PERCUTANEOUS STENT INTERVENTION;  Surgeon: Troy Sine, MD;  Location: [facility] CATH LAB;  Service: Cardiovascular;;  DES Prox RCA 3.5x15 xience     Current Meds  Medication Sig  . acetaminophen (TYLENOL) 325 MG tablet Take 650 mg by mouth every 6 (six) hours as needed (for pain.).   Marland Kitchen aspirin EC 81 MG tablet Take 81 mg by mouth every evening.   . clopidogrel (PLAVIX) 75 MG tablet TAKE 1 TABLET BY MOUTH EVERY DAY (Patient taking differently: Take 75 mg by mouth daily. PLAVIX - Should not be held prior to cath procedure unless specifically ordered)  . diazepam (VALIUM) 5 MG tablet Take 1 by mouth 1 hour  pre-procedure with very light food. May bring 2nd tablet to appointment.  Marland Kitchen ezetimibe-simvastatin (VYTORIN) 10-40 MG tablet TAKE 1 TABLET AT BEDTIME BY MOUTH.  . famotidine (PEPCID) 20 MG tablet TAKE 1 TABLET (20 MG TOTAL) BY MOUTH 2 (TWO) TIMES DAILY. (Patient taking differently: Take 20 mg by mouth 2 (two) times daily. )  . ferrous sulfate 325 (65 FE) MG tablet TAKE 1 TABLET BY MOUTH EVERY DAY WITH BREAKFAST  . furosemide (LASIX) 20 MG tablet TAKE 1 TABLET BY MOUTH EVERY DAY  .  lisinopril (ZESTRIL) 5 MG tablet Take 1 tablet (5 mg total) by mouth daily.  . metoprolol succinate (TOPROL-XL) 50 MG 24 hr tablet TAKE 1 TABLET (50 MG TOTAL) BY MOUTH DAILY. TAKE WITH OR IMMEDIATELY FOLLOWING A MEAL.  . nitroGLYCERIN (NITROSTAT) 0.4 MG SL tablet Place 1 tablet (0.4 mg total) under the tongue every 5 (five) minutes x 3 doses as needed for chest pain.  Marland Kitchen omega-3 acid ethyl esters (LOVAZA) 1 g capsule Take 1,000 mg by mouth at bedtime. Reported on 06/05/2015  . pantoprazole (PROTONIX) 40 MG tablet TAKE 1 TABLET (40 MG TOTAL) BY MOUTH 2 (TWO) TIMES DAILY BEFORE A MEAL.  Marland Kitchen rOPINIRole (REQUIP) 1 MG tablet Take 1 tablet (1 mg total) by mouth 2 (two) times daily.  . tamsulosin (FLOMAX) 0.4 MG CAPS capsule TAKE 1 CAPSULE BY MOUTH EVERY DAY  12 system ROS was negative unless otherwise noted in HPI   Observations/Objective:  DATA  Mesenteric Duplex (01-24-19): Duplex Findings: +----------------------+--------+--------+------+--------+ Mesenteric            PSV cm/sEDV cm/sPlaqueComments +----------------------+--------+--------+------+--------+ Aorta at Celiac          58                          +----------------------+--------+--------+------+--------+ Celiac Artery Proximal  196                          +----------------------+--------+--------+------+--------+ SMA Origin              241                          +----------------------+--------+--------+------+--------+ SMA Proximal            167                          +----------------------+--------+--------+------+--------+ SMA Mid                  29                          +----------------------+--------+--------+------+--------+ CHA                      98      39                  +----------------------+--------+--------+------+--------+ Splenic                  59                          +----------------------+--------+--------+------+--------+ IMA                       86                          +----------------------+--------+--------+------+--------+  Mesenteric Technologist observations: Residual EVAR sac measures 2.91 x 4.1 cm with no evidence of endoleak.  Summary: Mesenteric: Elevated velocities are seen in the celiac and proximal SMA arteries with flow characteristics consistent with stenosis (post stenotic turbulence) howevervelocities do not fall in the >70% category.    CarotidDuplex(01-01-19): Patent bilateral endarterectomy sites with no evidence of hyperplasia or restenosis. No significant stenosis of the bilateral ECA of CCA. Bilateral vertebral arteries are antegrade (normal). Bilateral subclavian arteries are multiphasic. No significant change in comparison to the examson10/27/16,01-14-16, and 01-19-17.   CTA abd/pelvis 08-04-18 requested by Dr. Hilarie Fredrickson to evaluate abdominal pain: Lower chest: Unremarkable.  Hepatobiliary: No suspicious focal abnormality within the liver parenchyma. Gallbladder surgically absent. No intrahepatic or extrahepatic biliary dilation.  Pancreas: No focal mass lesion. No dilatation of the main duct. No intraparenchymal cyst. No peripancreatic edema.  Spleen: No splenomegaly. No focal mass lesion.  Adrenals/Urinary Tract: Stable bilateral adrenal thickening without discrete nodule or mass. Right kidney unremarkable. 1 mm nonobstructing stone identified lower pole left kidney. No evidence for hydroureter. The urinary bladder appears normal for the degree of distention.  Stomach/Bowel: Proximal gastric wall appears thickened, but this is similar to prior and may reflect underdistention. Duodenum is normally positioned as is the ligament of Treitz. No small bowel  wall thickening. No small bowel dilatation. The terminal ileum is normal. The appendix is normal. No gross colonic mass. No colonic wall thickening. Diverticular changes are noted in the left colon without  evidence of diverticulitis.  Vascular/Lymphatic: Status post aortic endograft placement. Native aneurysm sac decreased compared to prior measuring 4.1 x 3.2 cm today compared to 5.3 x 5.5 cm previously. There is no gastrohepatic or hepatoduodenal ligament lymphadenopathy. No intraperitoneal or retroperitoneal lymphadenopathy. No pelvic sidewall lymphadenopathy.  Reproductive: Prostate gland is enlarged.  Other: No intraperitoneal free fluid.  Musculoskeletal: No worrisome lytic or sclerotic osseous abnormality.  IMPRESSION: 1. No acute findings in the abdomen or pelvis. Specifically, no findings to explain the patient's history of left lower quadrant pain. 2. Left colonic diverticulosis without diverticulitis. 3. Status post aortic endograft placement with interval decrease in size of the native aneurysm. 4. Prostatomegaly.      CTA chest/abd/pelvis at [facility] (03-17-16): VASCULAR FINDINGS:   Scattered atheromatous plaque and calcification throughout the aorta and its branch vessels, with irregular plaque worse at descending thoracic aorta. There is moderate narrowing of the left proximal subclavian artery measuring approximately 50%, similar to prior exam.Brachiocephalic, bilateral common carotid arteries, bilateral subclavian arteries, and visualized proximal bilateral vertebral arteries are patent.    AORTA: Status post aortobiiliac endograft with fenestrations at bilateral renal arteries. Excluded aneurysm sac measures 4.6 x 3.9 cm (6:192), previously measuring 5.2 x 4.8 cm. No evidence of endoleak.   MESENTERIC arteries:Celiac artery is severely narrowed origin and poststenotic dilatation, unchanged. SMA artery is patent. 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 Replaced left hepatic artery from left gastric artery.   RENAL arteries: Bilateral single renal artery stents are patent.   ILIAC arteries: Bilateral common iliac artery stents  are patent. Bilateral external and internal iliac arteries are patent with mild scattered atheromatous plaque and calcification.    FEMORAL arteries: The visualized bilateral common femoral artery, SFA, and PFA are patent. Scattered atheromatous plaque and calcification of the common femoral artery, SFA, and PSA. There is moderate narrowing of the right common femoral artery, measuring approximately 50%. There is mild narrowing of the left common femoral artery, measuring approximately 25-50%.   Assessment and Plan: Mamoudou Digirolamo. is a 78 y.o. male who is s/p bilateral carotid endarterectomies in February and March of 2010. He has no history of stroke or TIA.  Carotid duplex on 01-01-19 showed patent bilateral endarterectomy sites with no evidence of hyperplasia or restenosis.  He isalsos/p2-vessel FEVAR with a Z-Fen deviceonMarch [id number] by Dr. 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 Sammuel Hines had been monitoring pt re this, pt requested that we monitor this now.   He has no history of stroke or TIA. Carotid duplex on 01-01-19 showed no restenosis of the bilateral ICA.   His pedal pulses are palpable.  He resumed smoking early in 2020 after having quit in 2015. Over 3 minutes was spent counseling patient re smoking cessation, and patient was mailed several free resources re smoking cessation.   Fortunately he does not have DM.  He is being evaluated by GI for abdominal pain, wight loss, and frequent dark stools. He denies post prandial abdominal pain, denies food fear. CTA abd/pelvis requested by Dr. Hilarie Fredrickson and performed in May 2020 did not mention any mesenteric artery stenosis; AAA stent graft was noted and AAA sac size decreased from 5.5 cm to 4.1 cm.   Mesenteric duplex on 01-24-19 shows <70% stenosis, not enough  stenosis to cause ischemic intestine symptoms. He is being evaluated by Dr. Hilarie Fredrickson for abdominal symptoms.   Residual EVAR sac measures 2.91 x 4.1 cm with no evidence of endoleak.    Follow Up Instructions:   Follow up with EVAR duplex in a year, carotid duplex in 18 months.    I discussed the assessment and treatment plan with the patient. The patient was provided an opportunity to ask questions and all were answered. The patient agreed with the plan and demonstrated an understanding of the instructions.   The patient was advised to call back or seek an in-person evaluation if the symptoms worsen or if the condition fails to improve as anticipated.  I spent 8 minutes with the patient via telephone encounter.   Gabrielle Dare Nickel [facility] Office: [phone]  01/25/2019, 3:41 PM

## 2019-01-25 NOTE — Patient Instructions (Signed)
Steps to Quit Smoking Smoking tobacco is the leading cause of preventable death. It can affect almost every organ in the body. Smoking puts you and people around you at risk for many serious, long-lasting (chronic) diseases. Quitting smoking can be hard, but it is one of the best things that you can do for your health. It is never too late to quit. How do I get ready to quit? When you decide to quit smoking, make a plan to help you succeed. Before you quit:  Pick a date to quit. Set a date within the next 2 weeks to give you time to prepare.  Write down the reasons why you are quitting. Keep this list in places where you will see it often.  Tell your family, friends, and co-workers that you are quitting. Their support is important.  Talk with your doctor about the choices that may help you quit.  Find out if your health insurance will pay for these treatments.  Know the people, places, things, and activities that make you want to smoke (triggers). Avoid them. What first steps can I take to quit smoking?  Throw away all cigarettes at home, at work, and in your car.  Throw away the things that you use when you smoke, such as ashtrays and lighters.  Clean your car. Make sure to empty the ashtray.  Clean your home, including curtains and carpets. What can I do to help me quit smoking? Talk with your doctor about taking medicines and seeing a counselor at the same time. You are more likely to succeed when you do both.  If you are pregnant or breastfeeding, talk with your doctor about counseling or other ways to quit smoking. Do not take medicine to help you quit smoking unless your doctor tells you to do so. To quit smoking: Quit right away  Quit smoking totally, instead of slowly cutting back on how much you smoke over a period of time.  Go to counseling. You are more likely to quit if you go to counseling sessions regularly. Take medicine You may take medicines to help you quit. Some  medicines need a prescription, and some you can buy over-the-counter. Some medicines may contain a drug called nicotine to replace the nicotine in cigarettes. Medicines may:  Help you to stop having the desire to smoke (cravings).  Help to stop the problems that come when you stop smoking (withdrawal symptoms). Your doctor may ask you to use:  Nicotine patches, gum, or lozenges.  Nicotine inhalers or sprays.  Non-nicotine medicine that is taken by mouth. Find resources Find resources and other ways to help you quit smoking and remain smoke-free after you quit. These resources are most helpful when you use them often. They include:  Online chats with a counselor.  Phone quitlines.  Printed self-help materials.  Support groups or group counseling.  Text messaging programs.  Mobile phone apps. Use apps on your mobile phone or tablet that can help you stick to your quit plan. There are many free apps for mobile phones and tablets as well as websites. Examples include Quit Guide from the CDC and smokefree.gov  What things can I do to make it easier to quit?   Talk to your family and friends. Ask them to support and encourage you.  Call a phone quitline (1-800-QUIT-NOW), reach out to support groups, or work with a counselor.  Ask people who smoke to not smoke around you.  Avoid places that make you want to smoke,   such as: ? Bars. ? Parties. ? Smoke-break areas at work.  Spend time with people who do not smoke.  Lower the stress in your life. Stress can make you want to smoke. Try these things to help your stress: ? Getting regular exercise. ? Doing deep-breathing exercises. ? Doing yoga. ? Meditating. ? Doing a body scan. To do this, close your eyes, focus on one area of your body at a time from head to toe. Notice which parts of your body are tense. Try to relax the muscles in those areas. How will I feel when I quit smoking? Day 1 to 3 weeks Within the first 24 hours,  you may start to have some problems that come from quitting tobacco. These problems are very bad 2-3 days after you quit, but they do not often last for more than 2-3 weeks. You may get these symptoms:  Mood swings.  Feeling restless, nervous, angry, or annoyed.  Trouble concentrating.  Dizziness.  Strong desire for high-sugar foods and nicotine.  Weight gain.  Trouble pooping (constipation).  Feeling like you may vomit (nausea).  Coughing or a sore throat.  Changes in how the medicines that you take for other issues work in your body.  Depression.  Trouble sleeping (insomnia). Week 3 and afterward After the first 2-3 weeks of quitting, you may start to notice more positive results, such as:  Better sense of smell and taste.  Less coughing and sore throat.  Slower heart rate.  Lower blood pressure.  Clearer skin.  Better breathing.  Fewer sick days. Quitting smoking can be hard. Do not give up if you fail the first time. Some people need to try a few times before they succeed. Do your best to stick to your quit plan, and talk with your doctor if you have any questions or concerns. Summary  Smoking tobacco is the leading cause of preventable death. Quitting smoking can be hard, but it is one of the best things that you can do for your health.  When you decide to quit smoking, make a plan to help you succeed.  Quit smoking right away, not slowly over a period of time.  When you start quitting, seek help from your doctor, family, or friends. This information is not intended to replace advice given to you by your health care provider. Make sure you discuss any questions you have with your health care provider. Document Released: 12/26/2008 Document Revised: 05/19/2018 Document Reviewed: 05/20/2018 Elsevier Patient Education  2020 [employer]  

## 2019-01-30 ENCOUNTER — Encounter: Payer: Self-pay | Admitting: Internal Medicine

## 2019-01-30 ENCOUNTER — Ambulatory Visit (INDEPENDENT_AMBULATORY_CARE_PROVIDER_SITE_OTHER): Payer: Medicare Other | Admitting: Internal Medicine

## 2019-01-30 VITALS — BP 132/60 | HR 68 | Temp 97.9°F | Ht 66.0 in | Wt 131.6 lb

## 2019-01-30 DIAGNOSIS — I6523 Occlusion and stenosis of bilateral carotid arteries: Secondary | ICD-10-CM | POA: Diagnosis not present

## 2019-01-30 DIAGNOSIS — R1084 Generalized abdominal pain: Secondary | ICD-10-CM

## 2019-01-30 DIAGNOSIS — A048 Other specified bacterial intestinal infections: Secondary | ICD-10-CM | POA: Diagnosis not present

## 2019-01-30 DIAGNOSIS — D509 Iron deficiency anemia, unspecified: Secondary | ICD-10-CM

## 2019-01-30 DIAGNOSIS — R195 Other fecal abnormalities: Secondary | ICD-10-CM

## 2019-01-30 MED ORDER — PANTOPRAZOLE SODIUM 40 MG PO TBEC: 40.0000 mg | DELAYED_RELEASE_TABLET | ORAL | 0 refills | Status: DC

## 2019-01-30 MED ORDER — PANCRELIPASE (LIP-PROT-AMYL) 36000-114000 UNITS PO CPEP: ORAL_CAPSULE | ORAL | 0 refills | Status: DC

## 2019-01-30 MED ORDER — FAMOTIDINE 20 MG PO TABS: 20.0000 mg | ORAL_TABLET | Freq: Every day | ORAL | 0 refills | Status: DC

## 2019-01-30 NOTE — Progress Notes (Signed)
Subjective:    Patient ID: Erik [name]., male    DOB: 1940/09/06, 78 y.o.   MRN: [id number]  HPI Erik [name] is a 78 year old male with a past medical history of H. pylori gastritis status post treatment, history of iron deficiency, chronic abdominal pain, adenomatous colon polyps, colonic diverticulosis, CAD and peripheral vascular disease, hypertension, hyperlipidemia and history of CHF who is here for follow-up.  He is here alone today.  He was last seen in the office on 12/05/2018 by Tye Savoy, NP.  He was treated for H. pylori after upper endoscopy and colonoscopy which I performed in July 2020.  He completed antibiotic therapy.  EGD revealed normal esophagus, 2 cm hiatal hernia with Schatzki's ring and atrophic appearing gastritis with areas of linear inflammation/pale mucosa.  The examined duodenum was normal.  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  His colonoscopy revealed 3 polyps greatest being 6 mm in size, diverticulosis and medium-sized hemorrhoids.  The polyps were tubular adenoma x1 SSP x1 and hyperplastic x1.  He has been struggling with ongoing abdominal pain.  He had mesenteric Doppler study done 6 days ago which we reviewed.  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  Vascular surgery did not feel that this was the cause of his abdominal pain.  He reports that overall he is feeling better he has been on twice daily PPI and twice daily H2 blocker.  He has nausea and an uncomfortable feeling when he bends over.  This is not present all the time.  He still does have urgency to have bowel movement particularly after eating.  His appetite has not been normal.  He does not have worsening abdominal pain with eating.  He is not vomiting.  He has not seen blood in his stool or melena.  He has continued oral iron and stools are dark but he does not feel that he is tolerating oral iron poorly.  He continues on Plavix  and aspirin.  He has not needed nitroglycerin recently.    Review of Systems As per HPI, otherwise negative  Current Medications, Allergies, Past Medical History, Past Surgical History, Family History and Social History were reviewed in [employer] record.     Objective:   Physical Exam BP 132/60   Pulse 68   Temp 97.9 F (36.6 C)   Ht 5\' 6"  (1.676 m)   Wt 131 lb 9.6 oz (59.7 kg)   BMI 21.24 kg/m  Gen: awake, alert, NAD HEENT: anicteric, op clear CV: RRR, no mrg Pulm: CTA b/l Abd: soft, NT/ND, +BS throughout Ext: no c/c/e Neuro: nonfocal  ABDOMINAL VISCERAL   Indications: Abdominal pain   High Risk Factors: Hypertension, Diabetes, current smoker, prior MI.   Vascular               EVAR repair of AAA. Last residual sac measurement was Interventions:         4.1 cm.   Limitations: Patient discomfort and Breathing arifact and shadowing. 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   Duplex Findings: +----------------------+--------+--------+------+--------+ Mesenteric            PSV cm/sEDV cm/sPlaqueComments +----------------------+--------+--------+------+--------+ Aorta at Celiac          58                          +----------------------+--------+--------+------+--------+  Celiac Artery Proximal  196                          +----------------------+--------+--------+------+--------+ SMA Origin              241                          +----------------------+--------+--------+------+--------+ SMA Proximal            167                          +----------------------+--------+--------+------+--------+ SMA Mid                  29                          +----------------------+--------+--------+------+--------+ CHA                      98      39                   +----------------------+--------+--------+------+--------+ Splenic                  59                          +----------------------+--------+--------+------+--------+ IMA                      86                          +----------------------+--------+--------+------+--------+     Mesenteric Technologist observations: Residual EVAR sac measures 2.91 x 4.1 cm with no evidence of endoleak.     Summary: Mesenteric:   Elevated velocities are seen in the celiac and proximal SMA arteries with flow characteristics consistent with stenosis (post stenotic turbulence) however velocities do not fall in the >70% category.   *See table(s) above for measurements and observations.   Diagnosing physician: Erik [name]   Electronically signed by Erik [name] on 01/24/2019   CBC    Component Value Date/Time   WBC 7.1 12/05/2018 1058   RBC 4.44 12/05/2018 1058   HGB 12.8 (L) 12/05/2018 1058   HCT 38.6 (L) 12/05/2018 1058   PLT 271.0 12/05/2018 1058   MCV 86.9 12/05/2018 1058   MCH 27.3 08/11/2018 0932   MCHC 33.1 12/05/2018 1058   RDW 14.5 12/05/2018 1058   LYMPHSABS 0.8 07/25/2018 1023   MONOABS 1.6 (H) 07/25/2018 1023   EOSABS 0.1 07/25/2018 1023   BASOSABS 0.2 (H) 07/25/2018 1023   Iron/TIBC/Ferritin/ %Sat    Component Value Date/Time   IRON 125 12/05/2018 1058   FERRITIN 30.9 12/05/2018 1058   IRONPCTSAT 36.6 12/05/2018 1058        Assessment & Plan:  77 year old male with a past medical history of H. pylori gastritis status post treatment, history of iron deficiency, chronic abdominal pain, adenomatous colon polyps, colonic diverticulosis, CAD and peripheral vascular disease, hypertension, hyperlipidemia and history of CHF who is here for follow-up.  1.  Mid abdominal pain --unclear etiology for this abdominal pain but it seems to be worse with moving such as bending.  We have treated his H. pylori gastritis and he has been on acid suppression  therapy.  This may be  musculoskeletal pain or even visceral type pain related to his prior Z-Fen device.  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  I agree with this given that he does not have postprandial pain.  We will work on his loose stools, see below  2.  History of H. pylori gastritis --he completed antibiotic therapy and his follow-up H. pylori stool antigen was negative indicating eradication of infection.  I am going to back off of PPI and H2 blocker though I will leave him on more standard dosing. --Decrease pantoprazole to 40 mg once a day, take in the morning 30 minutes before breakfast --Decrease famotidine to 20 mg in the evening  3.  Postprandial loose stools --query pancreatic insufficiency given weight loss and postprandial nature of stools.  Trial of Creon --Creon 36,000 units, 2 caps with every meal, 1 with snacks.  Samples provided and he is asked when the samples run out to call me and let me know if this helped.  If not trial of Lomotil  4.  History of IDA --still mild anemia but iron studies have normalized.  He will continue low-dose oral iron for now.  His iron deficiency may have been secondary to his H. pylori gastritis.  5.  History of adenomatous colon polyps --will likely not require surveillance colonoscopy based on age, last colonoscopy July 2020  25 minutes spent with the patient today. Greater than 50% was spent in counseling and coordination of care with the patient

## 2019-01-30 NOTE — Patient Instructions (Signed)
Please decrease your pantoprazole to 40 mg every morning  Decrease famotidine dosage to 20 mg every evening.  Remain on oral iron supplement.  We have given you creon 36,000 units- Take 2 capsules with meals and 1 capsule with each snack.  Call our office with an update on how the samples did in regards to helping your abdominal pain and the loose stools after eating. Our phone number is [phone].  If you are age 77 or older, your body mass index should be between 23-30. Your Body mass index is 21.24 kg/m. If this is out of the aforementioned range listed, please consider follow up with your Primary Care Provider.  If you are age 29 or younger, your body mass index should be between 19-25. Your Body mass index is 21.24 kg/m. If this is out of the aformentioned range listed, please consider follow up with your Primary Care Provider.

## 2019-04-06 ENCOUNTER — Other Ambulatory Visit: Payer: Self-pay | Admitting: Internal Medicine

## 2019-04-07 ENCOUNTER — Ambulatory Visit: Payer: Medicare Other | Attending: Internal Medicine

## 2019-04-07 DIAGNOSIS — Z23 Encounter for immunization: Secondary | ICD-10-CM | POA: Insufficient documentation

## 2019-04-07 NOTE — Progress Notes (Signed)
   [id number]  Name:  Erik [name].    MRN: [id number] DOB: 26-Jan-1941  04/07/2019  [name] was observed post Covid-19 immunization for 30 minutes based on pre-vaccination screening without incidence. He was provided with Vaccine Information Sheet and instruction to access the V-Safe system.   [name] was instructed to call 911 with any severe reactions post vaccine: Marland Kitchen Difficulty breathing  . Swelling of your face and throat  . A fast heartbeat  . A bad rash all over your body  . Dizziness and weakness    Immunizations Administered    Name Date Dose VIS Date Route   Pfizer COVID-19 Vaccine 04/07/2019 12:08 PM 0.3 mL 02/23/2019 Intramuscular   Manufacturer: [place]   Lot: [id number]   [place]: [id number]

## 2019-04-15 ENCOUNTER — Other Ambulatory Visit: Payer: Self-pay | Admitting: Internal Medicine

## 2019-04-18 ENCOUNTER — Other Ambulatory Visit: Payer: Self-pay | Admitting: Family Medicine

## 2019-04-28 ENCOUNTER — Ambulatory Visit: Payer: Medicare Other | Attending: Internal Medicine

## 2019-04-28 DIAGNOSIS — Z23 Encounter for immunization: Secondary | ICD-10-CM | POA: Insufficient documentation

## 2019-04-28 NOTE — Progress Notes (Signed)
   [id number]  Name:  Erik [name].    MRN: [id number] DOB: 10-Sep-1940  04/28/2019  Erik [name] was observed post Covid-19 immunization for 15 minutes without incidence. He was provided with Vaccine Information Sheet and instruction to access the V-Safe system.   Erik [name] was instructed to call 911 with any severe reactions post vaccine: Marland Kitchen Difficulty breathing  . Swelling of your face and throat  . A fast heartbeat  . A bad rash all over your body  . Dizziness and weakness    Immunizations Administered    Name Date Dose VIS Date Route   Pfizer COVID-19 Vaccine 04/28/2019 10:37 AM 0.3 mL 02/23/2019 Intramuscular   Manufacturer: [place]   Lot: [id number]   [place]: [id number]

## 2019-04-30 ENCOUNTER — Telehealth: Payer: Self-pay | Admitting: Internal Medicine

## 2019-04-30 DIAGNOSIS — D509 Iron deficiency anemia, unspecified: Secondary | ICD-10-CM

## 2019-04-30 NOTE — Telephone Encounter (Signed)
Dr Hilarie Fredrickson, please advise...would you have patient to continue iron supplement as is or do you want him to have repeat labs before deciding?

## 2019-05-01 ENCOUNTER — Other Ambulatory Visit (INDEPENDENT_AMBULATORY_CARE_PROVIDER_SITE_OTHER): Payer: Medicare Other

## 2019-05-01 DIAGNOSIS — D509 Iron deficiency anemia, unspecified: Secondary | ICD-10-CM | POA: Diagnosis not present

## 2019-05-01 LAB — CBC WITH DIFFERENTIAL/PLATELET
Basophils Absolute: 0.1 10*3/uL (ref 0.0–0.1)
Basophils Relative: 1.1 % (ref 0.0–3.0)
Eosinophils Absolute: 0.3 10*3/uL (ref 0.0–0.7)
Eosinophils Relative: 2.7 % (ref 0.0–5.0)
HCT: 40 % (ref 39.0–52.0)
Hemoglobin: 13.1 g/dL (ref 13.0–17.0)
Lymphocytes Relative: 9.6 % — ABNORMAL LOW (ref 12.0–46.0)
Lymphs Abs: 1.1 10*3/uL (ref 0.7–4.0)
MCHC: 32.7 g/dL (ref 30.0–36.0)
MCV: 86.6 fl (ref 78.0–100.0)
Monocytes Absolute: 1.1 10*3/uL — ABNORMAL HIGH (ref 0.1–1.0)
Monocytes Relative: 9.4 % (ref 3.0–12.0)
Neutro Abs: 9.2 10*3/uL — ABNORMAL HIGH (ref 1.4–7.7)
Neutrophils Relative %: 77.2 % — ABNORMAL HIGH (ref 43.0–77.0)
Platelets: 280 10*3/uL (ref 150.0–400.0)
RBC: 4.61 Mil/uL (ref 4.22–5.81)
RDW: 14.3 % (ref 11.5–15.5)
WBC: 11.9 10*3/uL — ABNORMAL HIGH (ref 4.0–10.5)

## 2019-05-01 LAB — IBC + FERRITIN
Ferritin: 33.8 ng/mL (ref 22.0–322.0)
Iron: 48 ug/dL (ref 42–165)
Saturation Ratios: 14.7 % — ABNORMAL LOW (ref 20.0–50.0)
Transferrin: 233 mg/dL (ref 212.0–360.0)

## 2019-05-01 MED ORDER — FERROUS SULFATE 325 (65 FE) MG PO TABS: ORAL_TABLET | ORAL | 1 refills | Status: DC

## 2019-05-01 NOTE — Telephone Encounter (Signed)
He should continue iron, thus it can be refilled He also should have a CBC, IBC plus ferritin when convenient for him

## 2019-05-01 NOTE — Telephone Encounter (Signed)
I have spoken to patient to advise that Dr Hilarie Fredrickson would like repeat labs on him as soon as convenient. Also advised to continue iron supplement. Patient verbalizes understanding and states he will come today for labs.

## 2019-05-02 ENCOUNTER — Other Ambulatory Visit: Payer: Self-pay | Admitting: Family Medicine

## 2019-05-14 ENCOUNTER — Other Ambulatory Visit (HOSPITAL_COMMUNITY): Payer: Self-pay | Admitting: Cardiology

## 2019-05-31 ENCOUNTER — Ambulatory Visit (INDEPENDENT_AMBULATORY_CARE_PROVIDER_SITE_OTHER): Payer: Medicare Other

## 2019-05-31 ENCOUNTER — Other Ambulatory Visit: Payer: Self-pay

## 2019-05-31 ENCOUNTER — Other Ambulatory Visit (INDEPENDENT_AMBULATORY_CARE_PROVIDER_SITE_OTHER): Payer: Medicare Other

## 2019-05-31 ENCOUNTER — Telehealth: Payer: Self-pay | Admitting: *Deleted

## 2019-05-31 ENCOUNTER — Telehealth: Payer: Self-pay | Admitting: Family Medicine

## 2019-05-31 ENCOUNTER — Ambulatory Visit: Payer: Medicare Other

## 2019-05-31 VITALS — Wt 125.0 lb

## 2019-05-31 DIAGNOSIS — R7303 Prediabetes: Secondary | ICD-10-CM

## 2019-05-31 DIAGNOSIS — Z0000 Encounter for general adult medical examination without abnormal findings: Secondary | ICD-10-CM

## 2019-05-31 DIAGNOSIS — E785 Hyperlipidemia, unspecified: Secondary | ICD-10-CM

## 2019-05-31 DIAGNOSIS — F17200 Nicotine dependence, unspecified, uncomplicated: Secondary | ICD-10-CM | POA: Diagnosis not present

## 2019-05-31 DIAGNOSIS — Z122 Encounter for screening for malignant neoplasm of respiratory organs: Secondary | ICD-10-CM | POA: Diagnosis not present

## 2019-05-31 LAB — LIPID PANEL
Cholesterol: 109 mg/dL (ref 0–200)
HDL: 41.1 mg/dL (ref >39.00)
LDL Cholesterol: 48 mg/dL (ref 0–99)
NonHDL: 68.12
Total CHOL/HDL Ratio: 3
Triglycerides: 102 mg/dL (ref 0.0–149.0)
VLDL: 20.4 mg/dL (ref 0.0–40.0)

## 2019-05-31 LAB — COMPREHENSIVE METABOLIC PANEL
ALT: 8 U/L (ref 0–53)
AST: 11 U/L (ref 0–37)
Albumin: 3.7 g/dL (ref 3.5–5.2)
Alkaline Phosphatase: 69 U/L (ref 39–117)
BUN: 18 mg/dL (ref 6–23)
CO2: 31 mEq/L (ref 19–32)
Calcium: 8.8 mg/dL (ref 8.4–10.5)
Chloride: 104 mEq/L (ref 96–112)
Creatinine, Ser: 0.98 mg/dL (ref 0.40–1.50)
GFR: 73.98 mL/min (ref >60.00)
Glucose, Bld: 101 mg/dL — ABNORMAL HIGH (ref 70–99)
Potassium: 4.1 mEq/L (ref 3.5–5.1)
Sodium: 139 mEq/L (ref 135–145)
Total Bilirubin: 0.4 mg/dL (ref 0.2–1.2)
Total Protein: 6.7 g/dL (ref 6.0–8.3)

## 2019-05-31 LAB — HEMOGLOBIN A1C: Hgb A1c MFr Bld: 5.8 % (ref 4.6–6.5)

## 2019-05-31 MED ORDER — ROPINIROLE HCL 2 MG PO TABS: 2.0000 mg | ORAL_TABLET | Freq: Two times a day (BID) | ORAL | 11 refills | Status: DC

## 2019-05-31 NOTE — Telephone Encounter (Signed)
Erik [name] notified as instructed by telephone.  Patient states understanding.

## 2019-05-31 NOTE — Telephone Encounter (Signed)
Spoke with [name].  He states the Requip 1 mg isn't working very well and wants to know if the dose can be increased any.  He is currently on 1 mg bid.  Please advise. Patient has his CPE scheduled for 06/05/2019.

## 2019-05-31 NOTE — Progress Notes (Signed)
No critical labs need to be addressed urgently. We will discuss labs in detail at upcoming office visit.   

## 2019-05-31 NOTE — Telephone Encounter (Signed)
Does he have more symptoms  of restless leg at night or during the day?

## 2019-05-31 NOTE — Telephone Encounter (Signed)
Okay.  he can use up any 1 mg he has by starting 2 mg at bedtime, 1 mg in AM. If still issues increase to 2 mg twice daily.. rx sent in for higher dose tablets.

## 2019-05-31 NOTE — Telephone Encounter (Signed)
-----   Message from Cloyd Stagers, RT sent at 05/23/2019 10:19 AM EST ----- Regarding: Lab Orders for Thursday 3.18.2021 Please place lab orders for Thursday 3.18.2021, office visit for physical on Tuesday 3.23.2021 Thank you, Dyke Maes RT(R)

## 2019-05-31 NOTE — Patient Instructions (Signed)
Erik [name] , Thank you for taking time to come for your Medicare Wellness Visit. I appreciate your ongoing commitment to your health goals. Please review the following plan we discussed and let me know if I can assist you in the future.   Screening recommendations/referrals: Colonoscopy: Up to date, completed 09/19/2018 Recommended yearly ophthalmology/optometry visit for glaucoma screening and checkup Recommended yearly dental visit for hygiene and checkup  Vaccinations: Influenza vaccine: declined Pneumococcal vaccine: Completed series Tdap vaccine: decline Shingles vaccine: discussed     Advanced directives: Advance directive discussed with you today. Even though you declined this today please call our office should you change your mind and we can give you the proper paperwork for you to fill out.  Conditions/risks identified: hypertension, hyperlipidemia  Next appointment: 06/05/2019 @ 11:20 am   Preventive Care 78 Years and Older, Male Preventive care refers to lifestyle choices and visits with your health care provider that can promote health and wellness. What does preventive care include?  A yearly physical exam. This is also called an annual well check.  Dental exams once or twice a year.  Routine eye exams. Ask your health care provider how often you should have your eyes checked.  Personal lifestyle choices, including:  Daily care of your teeth and gums.  Regular physical activity.  Eating a healthy diet.  Avoiding tobacco and drug use.  Limiting alcohol use.  Practicing safe sex.  Taking low doses of aspirin every day.  Taking vitamin and mineral supplements as recommended by your health care provider. What happens during an annual well check? The services and screenings done by your health care provider during your annual well check will depend on your age, overall health, lifestyle risk factors, and family history of disease. Counseling  Your health care  provider may ask you questions about your:  Alcohol use.  Tobacco use.  Drug use.  Emotional well-being.  Home and relationship well-being.  Sexual activity.  Eating habits.  History of falls.  Memory and ability to understand (cognition).  Work and work Statistician. Screening  You may have the following tests or measurements:  Height, weight, and BMI.  Blood pressure.  Lipid and cholesterol levels. These may be checked every 5 years, or more frequently if you are over 51 years old.  Skin check.  Lung cancer screening. You may have this screening every year starting at age 78 if you have a 30-pack-year history of smoking and currently smoke or have quit within the past 15 years.  Fecal occult blood test (FOBT) of the stool. You may have this test every year starting at age 78.  Flexible sigmoidoscopy or colonoscopy. You may have a sigmoidoscopy every 5 years or a colonoscopy every 10 years starting at age 78.  Prostate cancer screening. Recommendations will vary depending on your family history and other risks.  Hepatitis C blood test.  Hepatitis B blood test.  Sexually transmitted disease (STD) testing.  Diabetes screening. This is done by checking your blood sugar (glucose) after you have not eaten for a while (fasting). You may have this done every 1-3 years.  Abdominal aortic aneurysm (AAA) screening. You may need this if you are a current or former smoker.  Osteoporosis. You may be screened starting at age 5 if you are at high risk. Talk with your health care provider about your test results, treatment options, and if necessary, the need for more tests. Vaccines  Your health care provider may recommend certain vaccines, such as:  Influenza vaccine. This is recommended every year.  Tetanus, diphtheria, and acellular pertussis (Tdap, Td) vaccine. You may need a Td booster every 10 years.  Zoster vaccine. You may need this after age 31.  Pneumococcal  13-valent conjugate (PCV13) vaccine. One dose is recommended after age 78.  Pneumococcal polysaccharide (PPSV23) vaccine. One dose is recommended after age 15. Talk to your health care provider about which screenings and vaccines you need and how often you need them. This information is not intended to replace advice given to you by your health care provider. Make sure you discuss any questions you have with your health care provider. Document Released: 03/28/2015 Document Revised: 11/19/2015 Document Reviewed: 12/31/2014 Elsevier Interactive Patient Education  2017 [place] Prevention in the Home Falls can cause injuries. They can happen to people of all ages. There are many things you can do to make your home safe and to help prevent falls. What can I do on the outside of my home?  Regularly fix the edges of walkways and driveways and fix any cracks.  Remove anything that might make you trip as you walk through a door, such as a raised step or threshold.  Trim any bushes or trees on the path to your home.  Use bright outdoor lighting.  Clear any walking paths of anything that might make someone trip, such as rocks or tools.  Regularly check to see if handrails are loose or broken. Make sure that both sides of any steps have handrails.  Any raised decks and porches should have guardrails on the edges.  Have any leaves, snow, or ice cleared regularly.  Use sand or salt on walking paths during winter.  Clean up any spills in your garage right away. This includes oil or grease spills. What can I do in the bathroom?  Use night lights.  Install grab bars by the toilet and in the tub and shower. Do not use towel bars as grab bars.  Use non-skid mats or decals in the tub or shower.  If you need to sit down in the shower, use a plastic, non-slip stool.  Keep the floor dry. Clean up any water that spills on the floor as soon as it happens.  Remove soap buildup in the  tub or shower regularly.  Attach bath mats securely with double-sided non-slip rug tape.  Do not have throw rugs and other things on the floor that can make you trip. What can I do in the bedroom?  Use night lights.  Make sure that you have a light by your bed that is easy to reach.  Do not use any sheets or blankets that are too big for your bed. They should not hang down onto the floor.  Have a firm chair that has side arms. You can use this for support while you get dressed.  Do not have throw rugs and other things on the floor that can make you trip. What can I do in the kitchen?  Clean up any spills right away.  Avoid walking on wet floors.  Keep items that you use a lot in easy-to-reach places.  If you need to reach something above you, use a strong step stool that has a grab bar.  Keep electrical cords out of the way.  Do not use floor polish or wax that makes floors slippery. If you must use wax, use non-skid floor wax.  Do not have throw rugs and other things on the floor that  can make you trip. What can I do with my stairs?  Do not leave any items on the stairs.  Make sure that there are handrails on both sides of the stairs and use them. Fix handrails that are broken or loose. Make sure that handrails are as long as the stairways.  Check any carpeting to make sure that it is firmly attached to the stairs. Fix any carpet that is loose or worn.  Avoid having throw rugs at the top or bottom of the stairs. If you do have throw rugs, attach them to the floor with carpet tape.  Make sure that you have a light switch at the top of the stairs and the bottom of the stairs. If you do not have them, ask someone to add them for you. What else can I do to help prevent falls?  Wear shoes that:  Do not have high heels.  Have rubber bottoms.  Are comfortable and fit you well.  Are closed at the toe. Do not wear sandals.  If you use a stepladder:  Make sure that it  is fully opened. Do not climb a closed stepladder.  Make sure that both sides of the stepladder are locked into place.  Ask someone to hold it for you, if possible.  Clearly mark and make sure that you can see:  Any grab bars or handrails.  First and last steps.  Where the edge of each step is.  Use tools that help you move around (mobility aids) if they are needed. These include:  Canes.  Walkers.  Scooters.  Crutches.  Turn on the lights when you go into a dark area. Replace any light bulbs as soon as they burn out.  Set up your furniture so you have a clear path. Avoid moving your furniture around.  If any of your floors are uneven, fix them.  If there are any pets around you, be aware of where they are.  Review your medicines with your doctor. Some medicines can make you feel dizzy. This can increase your chance of falling. Ask your doctor what other things that you can do to help prevent falls. This information is not intended to replace advice given to you by your health care provider. Make sure you discuss any questions you have with your health care provider. Document Released: 12/26/2008 Document Revised: 08/07/2015 Document Reviewed: 04/05/2014 Elsevier Interactive Patient Education  2017 [employer].

## 2019-05-31 NOTE — Progress Notes (Signed)
PCP notes:  Health Maintenance: Flu vaccine- declined Tdap- insurance/financial Lung cancer screening- referral ordered   Abnormal Screenings: none   Patient concerns: none   Nurse concerns: none   Next PCP appt.: 06/05/2019 @ 11:20 am

## 2019-05-31 NOTE — Telephone Encounter (Signed)
Spoke with Erik [name].  He states his symptoms are more at night but during the day when he sits down his legs will start jumping so he spends a lot of the day standing up.  He states it has also moved up to his hands.

## 2019-05-31 NOTE — Progress Notes (Signed)
Subjective:   Erik [name]. is a 79 y.o. male who presents for Medicare Annual/Subsequent preventive examination.  Review of Systems: N/A   This visit is being conducted through telemedicine via telephone at the nurse health advisor's home address due to the COVID-19 pandemic. This patient has given me verbal consent via doximity to conduct this visit, patient states they are participating from their home address. Patient and myself are on the telephone call. There is no referral for this visit. Some vital signs may be absent or patient reported.    Patient identification: identified by name, DOB, and current address   Cardiac Risk Factors include: advanced age (>79men, >70 women);male gender;hypertension;dyslipidemia;smoking/ tobacco exposure     Objective:    Vitals: Wt 125 lb (56.7 kg)   BMI 20.18 kg/m   Body mass index is 20.18 kg/m.  Advanced Directives 05/31/2019 10/05/2018 05/09/2017 02/05/2015 01/09/2015 07/04/2014 12/18/2013  Does Patient Have a Medical Advance Directive? No No No No No No No  Would patient like information on creating a medical advance directive? No - Patient declined No - Patient declined No - Patient declined No - patient declined information - Yes - Educational materials given No - patient declined information  Pre-existing out of facility DNR order (yellow form or pink MOST form) - - - - - - -    Tobacco Social History   Tobacco Use  Smoking Status Current Every Day Smoker  . Packs/day: 1.00  . Years: 50.00  . Pack years: 50.00  . Types: Cigarettes  . Last attempt to quit: 08/27/2013  . Years since quitting: 5.7  Smokeless Tobacco Former Systems developer  . Quit date: 09/10/2013  Tobacco Comment   off and on since age 55     Ready to quit: Not Answered Counseling given: Not Answered Comment: off and on since age 75   Clinical Intake:  Pre-visit preparation completed: Yes  Pain : No/denies pain     Nutritional Risks: None Diabetes:  No  How often do you need to have someone help you when you read instructions, pamphlets, or other written materials from your doctor or pharmacy?: 1 - Never What is the last grade level you completed in school?: 12th  Interpreter Needed?: No  Information entered by :: CJohnson, LPN  Past Medical History:  Diagnosis Date  . Adenomatous colon polyp   . Aortic aneurysm ([place])   . BPH (benign prostatic hypertrophy)   . Carotid artery stenosis    a. Bilateral CEA  . Cataract   . CHF (congestive heart failure) ([place])   . Chronic systolic heart failure (HCC)    a. EF 20-25%, mild LVH, mod HK, mid apicalanteroseptal myocardium, mild MR, LA mod dilated  . Collagen vascular disease ([place])   . COPD (chronic obstructive pulmonary disease) ([place])   . Coronary artery disease    a. LHC (08/2013): Lmain: short 30% distal, LAD: sml D1 & D2, 70% ostial D1, 95-99% LAD stenosis prox D2 LCx: sml/mod ramus subtot. occluded, 40% ostial set off lg OM1, 40% AV LCx after OM1, RCA: 90% prox (DES to RCA and prox LAD)  . Diverticulosis   . GERD (gastroesophageal reflux disease)   . Heart murmur   . History of colonic polyps   . Hyperlipidemia   . Hyperplastic colon polyp   . Hypertension   . Ischemic cardiomyopathy   . RLS (restless legs syndrome)    Past Surgical History:  Procedure Laterality Date  . cardiac stents  09-2013  .  CAROTID ENDARTERECTOMY  04/17/2008   right  . CAROTID ENDARTERECTOMY  05/30/08   Left  . CATARACT EXTRACTION W/PHACO Right 10/05/2018   Procedure: CATARACT EXTRACTION PHACO AND INTRAOCULAR LENS PLACEMENT ([place]), RIGHT;  Surgeon: Birder Robson, MD;  Location: [facility] ORS;  Service: Ophthalmology;  Laterality: Right;  [place] 01:06.4 cde 12.79 Fluid Pack Lot # [id number]  . CATARACT EXTRACTION W/PHACO Left 11/02/2018   Procedure: CATARACT EXTRACTION PHACO AND INTRAOCULAR LENS PLACEMENT ([place]), LEFT;  Surgeon: Birder Robson, MD;  Location: [facility] ORS;  Service: Ophthalmology;  Laterality:  Left;  [place]  01:10 CDE 11.52 Fluid pack lot # [id number]  . CHOLECYSTECTOMY     Gall Bladder  . CORONARY ANGIOGRAM  09/11/2013   Procedure: CORONARY ANGIOGRAM;  Surgeon: Troy Sine, MD;  Location: [facility] CATH LAB;  Service: Cardiovascular;;  . CORONARY ANGIOPLASTY     STENTS  . HERNIA REPAIR    . lower aorta aneurysm  05/26/2015   [facility]  . PERCUTANEOUS STENT INTERVENTION  09/11/2013   Procedure: PERCUTANEOUS STENT INTERVENTION;  Surgeon: Troy Sine, MD;  Location: [facility] CATH LAB;  Service: Cardiovascular;;  DES Prox RCA 3.5x15 xience    Family History  Problem Relation Age of Onset  . Heart disease Brother   . Hyperlipidemia Brother   . Pancreatic cancer Brother   . Hypertension Mother   . Cancer Father        unknown type; sounds GI  . Hyperlipidemia Brother   . Hyperlipidemia Brother   . Heart attack Brother   . Hyperlipidemia Brother   . Multiple sclerosis Brother   . Kidney disease Neg Hx   . Prostate cancer Neg Hx   . Colon cancer Neg Hx   . Esophageal cancer Neg Hx   . Rectal cancer Neg Hx   . Stomach cancer Neg Hx    Social History   Socioeconomic History  . Marital status: Married    Spouse name: Not on file  . Number of children: Not on file  . Years of education: Not on file  . Highest education level: Not on file  Occupational History  . Occupation: Retired Development worker, community  Tobacco Use  . Smoking status: Current Every Day Smoker    Packs/day: 1.00    Years: 50.00    Pack years: 50.00    Types: Cigarettes    Last attempt to quit: 08/27/2013    Years since quitting: 5.7  . Smokeless tobacco: Former Systems developer    Quit date: 09/10/2013  . Tobacco comment: off and on since age 6  Substance and Sexual Activity  . Alcohol use: No  . Drug use: No  . Sexual activity: Yes  Other Topics Concern  . Not on file  Social History Narrative   Lives with wife in [place]. Retired from the post office   Social Determinants of Radio broadcast assistant Strain: Low Risk   .  Difficulty of Paying Living Expenses: Not hard at all  Food Insecurity: No Food Insecurity  . Worried About Charity fundraiser in the Last Year: Never true  . Ran Out of Food in the Last Year: Never true  Transportation Needs: No Transportation Needs  . Lack of Transportation (Medical): No  . Lack of Transportation (Non-Medical): No  Physical Activity: Inactive  . Days of Exercise per Week: 0 days  . Minutes of Exercise per Session: 0 min  Stress: No Stress Concern Present  . Feeling of Stress : Not at all  Social Connections:   .  Frequency of Communication with Friends and Family:   . Frequency of Social Gatherings with Friends and Family:   . Attends Religious Services:   . Active Member of Clubs or Organizations:   . Attends Archivist Meetings:   Marland Kitchen Marital Status:     Outpatient Encounter Medications as of 05/31/2019  Medication Sig  . acetaminophen (TYLENOL) 325 MG tablet Take 650 mg by mouth every 6 (six) hours as needed (for pain.).   Marland Kitchen aspirin EC 81 MG tablet Take 81 mg by mouth every evening.   . clopidogrel (PLAVIX) 75 MG tablet TAKE 1 TABLET BY MOUTH EVERY DAY (Patient taking differently: Take 75 mg by mouth daily. PLAVIX - Should not be held prior to cath procedure unless specifically ordered)  . ezetimibe-simvastatin (VYTORIN) 10-40 MG tablet TAKE 1 TABLET BY MOUTH AT BEDTIME  . famotidine (PEPCID) 20 MG tablet Take 1 tablet (20 mg total) by mouth at bedtime.  . ferrous sulfate 325 (65 FE) MG tablet TAKE 1 TABLET BY MOUTH EVERY DAY WITH BREAKFAST  . furosemide (LASIX) 20 MG tablet TAKE 1 TABLET BY MOUTH EVERY DAY  . lipase/protease/amylase (CREON) 36000 UNITS CPEP capsule Take 2 capsules with each meal and 1 capsule with each snack  . lisinopril (ZESTRIL) 5 MG tablet TAKE 1 TABLET BY MOUTH EVERY DAY  . metoprolol succinate (TOPROL-XL) 50 MG 24 hr tablet TAKE 1 TABLET (50 MG TOTAL) BY MOUTH DAILY. TAKE WITH OR IMMEDIATELY FOLLOWING A MEAL.  . nitroGLYCERIN  (NITROSTAT) 0.4 MG SL tablet Place 1 tablet (0.4 mg total) under the tongue every 5 (five) minutes x 3 doses as needed for chest pain.  Marland Kitchen omega-3 acid ethyl esters (LOVAZA) 1 g capsule Take 1,000 mg by mouth at bedtime. Reported on 06/05/2015  . pantoprazole (PROTONIX) 40 MG tablet Take 1 tablet (40 mg total) by mouth daily.  Marland Kitchen rOPINIRole (REQUIP) 1 MG tablet TAKE 1 TABLET BY MOUTH TWICE A DAY  . tamsulosin (FLOMAX) 0.4 MG CAPS capsule TAKE 1 CAPSULE BY MOUTH EVERY DAY   No facility-administered encounter medications on file as of 05/31/2019.    Activities of Daily Living In your present state of health, do you have any difficulty performing the following activities: 05/31/2019 10/05/2018  Hearing? Y -  Comment right ear hearing loss -  Vision? N -  Difficulty concentrating or making decisions? N -  Walking or climbing stairs? N -  Dressing or bathing? N -  Doing errands, shopping? N N  Preparing Food and eating ? N -  Using the Toilet? N -  In the past six months, have you accidently leaked urine? N -  Do you have problems with loss of bowel control? Y -  Comment some bowel control issues -  Managing your Medications? N -  Managing your Finances? N -  Housekeeping or managing your Housekeeping? N -  Some recent data might be hidden    Patient Care Team: [name], MD as PCP - General (Family Medicine) Larey Dresser, MD as PCP - Cardiology (Cardiology)   Assessment:   This is a routine wellness examination for [facility].  Exercise Activities and Dietary recommendations Current Exercise Habits: The patient does not participate in regular exercise at present, Exercise limited by: None identified  Goals    . Follow up with Primary Care Provider     Starting 05/09/2017, I will continue to take medications as prescribed and to keep appointments with PCP as scheduled.     Marland Kitchen  Patient Stated     05/31/2019, I will maintain and continue medications as prescribed.        Fall  Risk Fall Risk  05/31/2019 05/30/2018 05/09/2017 04/20/2016 04/18/2015  Falls in the past year? 0 0 No No No  Number falls in past yr: 0 - - - -  Injury with Fall? 0 - - - -  Risk for fall due to : Medication side effect - - - -  Follow up Falls evaluation completed;Falls prevention discussed - - - -   Is the patient's home free of loose throw rugs in walkways, pet beds, electrical cords, etc?   yes      Grab bars in the bathroom? yes      Handrails on the stairs?   no      Adequate lighting?   yes  Timed Get Up and Go Performed: N/A  Depression Screen PHQ 2/9 Scores 05/31/2019 05/30/2018 05/09/2017 04/20/2016  PHQ - 2 Score 0 0 0 0  PHQ- 9 Score 0 - 0 -    Cognitive Function MMSE - Mini Mental State Exam 05/31/2019 05/09/2017  Orientation to time 5 5  Orientation to Place 5 5  Registration 3 3  Attention/ Calculation 5 0  Recall 3 2  Recall-comments - unable to recall 1 of 3 words  Language- name 2 objects - 0  Language- repeat 1 1  Language- follow 3 step command - 3  Language- read & follow direction - 0  Write a sentence - 0  Copy design - 0  Total score - 19  Mini Cog  Mini-Cog screen was completed. Maximum score is 22. A value of 0 denotes this part of the MMSE was not completed or the patient failed this part of the Mini-Cog screening.       Immunization History  Administered Date(s) Administered  . PFIZER SARS-COV-2 Vaccination 04/07/2019, 04/28/2019  . Pneumococcal Conjugate-13 04/16/2014  . Pneumococcal Polysaccharide-23 06/05/2008  . Td 06/05/2008    Qualifies for Shingles Vaccine: Yes   Screening Tests Health Maintenance  Topic Date Due  . TETANUS/TDAP  05/31/2023 (Originally 06/06/2018)  . INFLUENZA VACCINE  06/13/2023 (Originally 10/14/2018)  . PNA vac Low Risk Adult  Completed   Cancer Screenings: Lung: Low Dose CT Chest recommended if Age 52-80 years, 30 pack-year currently smoking OR have quit w/in 15 years. Patient does qualify. Referral  ordered. Colorectal: completed 09/19/2018  Additional Screenings:  Hepatitis C Screening: N/A      Plan:    Patient will maintain and continue medications as prescribed.   I have personally reviewed and noted the following in the patient's chart:   . Medical and social history . Use of alcohol, tobacco or illicit drugs  . Current medications and supplements . Functional ability and status . Nutritional status . Physical activity . Advanced directives . List of other physicians . Hospitalizations, surgeries, and ER visits in previous 12 months . Vitals . Screenings to include cognitive, depression, and falls . Referrals and appointments  In addition, I have reviewed and discussed with patient certain preventive protocols, quality metrics, and best practice recommendations. A written personalized care plan for preventive services as well as general preventive health recommendations were provided to patient.     Andrez Grime, LPN  [phone]

## 2019-06-04 DIAGNOSIS — H43813 Vitreous degeneration, bilateral: Secondary | ICD-10-CM | POA: Diagnosis not present

## 2019-06-05 ENCOUNTER — Other Ambulatory Visit: Payer: Self-pay

## 2019-06-05 ENCOUNTER — Encounter: Payer: Medicare Other | Admitting: Family Medicine

## 2019-06-05 ENCOUNTER — Other Ambulatory Visit: Payer: Self-pay | Admitting: Family Medicine

## 2019-06-05 ENCOUNTER — Ambulatory Visit (INDEPENDENT_AMBULATORY_CARE_PROVIDER_SITE_OTHER): Payer: Medicare Other | Admitting: Family Medicine

## 2019-06-05 ENCOUNTER — Encounter: Payer: Self-pay | Admitting: Family Medicine

## 2019-06-05 VITALS — BP 150/60 | HR 64 | Temp 98.3°F | Ht 66.0 in | Wt 131.2 lb

## 2019-06-05 DIAGNOSIS — I6523 Occlusion and stenosis of bilateral carotid arteries: Secondary | ICD-10-CM

## 2019-06-05 DIAGNOSIS — R7303 Prediabetes: Secondary | ICD-10-CM

## 2019-06-05 DIAGNOSIS — I255 Ischemic cardiomyopathy: Secondary | ICD-10-CM | POA: Diagnosis not present

## 2019-06-05 DIAGNOSIS — J449 Chronic obstructive pulmonary disease, unspecified: Secondary | ICD-10-CM

## 2019-06-05 DIAGNOSIS — I5022 Chronic systolic (congestive) heart failure: Secondary | ICD-10-CM

## 2019-06-05 DIAGNOSIS — E785 Hyperlipidemia, unspecified: Secondary | ICD-10-CM | POA: Diagnosis not present

## 2019-06-05 DIAGNOSIS — G2581 Restless legs syndrome: Secondary | ICD-10-CM

## 2019-06-05 DIAGNOSIS — I714 Abdominal aortic aneurysm, without rupture, unspecified: Secondary | ICD-10-CM

## 2019-06-05 DIAGNOSIS — I10 Essential (primary) hypertension: Secondary | ICD-10-CM

## 2019-06-05 NOTE — Assessment & Plan Note (Signed)
Slightly above goal in office today.. will follow at home.

## 2019-06-05 NOTE — Assessment & Plan Note (Signed)
Improved

## 2019-06-05 NOTE — Assessment & Plan Note (Signed)
Followed by Cardiology. Last note reviewed.

## 2019-06-05 NOTE — Assessment & Plan Note (Signed)
Stable symtpoms, minimal. Still smoking. Counsled to quit.. reviewed options. Pt palns to decrease over time.

## 2019-06-05 NOTE — Assessment & Plan Note (Signed)
LDL at goal on high dose statin.

## 2019-06-05 NOTE — Assessment & Plan Note (Signed)
Recent US minimal stenosis.

## 2019-06-05 NOTE — Progress Notes (Signed)
Chief Complaint  Patient presents with  . Annual Exam    History of Present Illness: HPI  The patient presents for review of chronic health problems. He/She also has the following acute concerns today:  The patient saw a LPN or RN for medicare wellness visit.  Prevention and wellness was reviewed in detail. Note reviewed and important notes copied below. Health Maintenance: Flu vaccine- declined Tdap- insurance/financial Lung cancer screening- referral ordered Abnormal Screenings: None  06/05/19 Hypertension:  on toprol, lisinopril.Marland Kitchen off entresto due to dizziness BP Readings from Last 3 Encounters:  06/05/19 (!) 150/60  01/30/19 132/60  01/01/19 (!) 116/57  Using medication without problems or lightheadedness: none Chest pain with exertion:none Edema:none Short of breath:none Average home BPs: Other issues:  He has been seeing Dr. Hilarie Fredrickson for abd pain, diarrhea.. this has resolved. Treated for hpylori gastritis: Vascular did not feel mesenteric ischemia.  Eating well, but small meals usually 2 meals a day. No on daily PPI and H2 blocker.  Wt Readings from Last 3 Encounters:  06/05/19 131 lb 4 oz (59.5 kg)  05/31/19 125 lb (56.7 kg)  01/30/19 131 lb 9.6 oz (59.7 kg)     Elevated Cholesterol:  LDL at goal < 70 on vytorin Lab Results  Component Value Date   CHOL 109 05/31/2019   HDL 41.10 05/31/2019   LDLCALC 48 05/31/2019   LDLDIRECT 176.9 05/09/2008   TRIG 102.0 05/31/2019   CHOLHDL 3 05/31/2019  Using medicationontens without problems:none Muscle aches:  none Diet compliance: eats minimmal Exercise: walks some, house work Other complaints:  B carotid stenosis, PAD claudication:L followed by Vascular MD, Dr. Marylene Land NP. reviewed 01/25/2019 note: Carotid duplex on 01-01-19 showed patent bilateral endarterectomy sites Mesenteric duplex on 01-24-19 shows <70% stenosis, not enough stenosis to cause ischemic intestine  symptoms.   [id number] at [facility] by Dr. Corky Sox, s/p renal stent ( 2-vessel FEVAR with a Z-Fen device onMarch 15,2017 by Dr. 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 [id number]  recoveredEF 55-60%  reviewed last OV: 07/2018:  echo in 5/20 with EF 45-50%, septal hypokinesis, moderate AI.  NYHA class II symptoms  S/P repeat cardiolyte.   In last year lumbar radiculopathy and leg weakness treated with steroid injection via D.r newton.  Euvolemic on lasix  BPH stable on flomax.  Mild COPD: minimal SOB with strenuous exertion, no daily cough  RLS: improved control on higher dose ropinirole.   This visit occurred during the SARS-CoV-2 public health emergency.  Safety protocols were in place, including screening questions prior to the visit, additional usage of staff PPE, and extensive cleaning of exam room while observing appropriate contact time as indicated for disinfecting solutions.   COVID 19 screen:  No recent travel or known exposure to COVID19 The patient denies respiratory symptoms of COVID 19 at this time. The importance of social distancing was discussed today.     Review of Systems  Constitutional: Negative for chills and fever.  HENT: Negative for congestion and ear pain.   Eyes: Negative for pain and redness.  Respiratory: Negative for cough and shortness of breath.   Cardiovascular: Negative for chest pain, palpitations and leg swelling.  Gastrointestinal: Negative for abdominal pain, blood in stool, constipation, diarrhea, nausea and vomiting.  Genitourinary: Negative for dysuria.  Musculoskeletal: Negative for falls and myalgias.  Skin: Negative for rash.  Neurological: Negative for dizziness.  Psychiatric/Behavioral: Negative  for depression. The patient is not nervous/anxious.       Past Medical History:  Diagnosis Date  . Adenomatous  colon polyp   . Aortic aneurysm ([place])   . BPH (benign prostatic hypertrophy)   . Carotid artery stenosis    a. Bilateral CEA  . Cataract   . CHF (congestive heart failure) ([place])   . Chronic systolic heart failure (HCC)    a. EF 20-25%, mild LVH, mod HK, mid apicalanteroseptal myocardium, mild MR, LA mod dilated  . Collagen vascular disease ([place])   . COPD (chronic obstructive pulmonary disease) ([place])   . Coronary artery disease    a. LHC (08/2013): Lmain: short 30% distal, LAD: sml D1 & D2, 70% ostial D1, 95-99% LAD stenosis prox D2 LCx: sml/mod ramus subtot. occluded, 40% ostial set off lg OM1, 40% AV LCx after OM1, RCA: 90% prox (DES to RCA and prox LAD)  . Diverticulosis   . GERD (gastroesophageal reflux disease)   . Heart murmur   . History of colonic polyps   . Hyperlipidemia   . Hyperplastic colon polyp   . Hypertension   . Ischemic cardiomyopathy   . RLS (restless legs syndrome)     reports that he has been smoking cigarettes. He has a 50.00 pack-year smoking history. He quit smokeless tobacco use about 5 years ago. He reports that he does not drink alcohol or use drugs.   Current Outpatient Medications:  .  acetaminophen (TYLENOL) 325 MG tablet, Take 650 mg by mouth every 6 (six) hours as needed (for pain.). , Disp: , Rfl:  .  aspirin EC 81 MG tablet, Take 81 mg by mouth every evening. , Disp: , Rfl:  .  clopidogrel (PLAVIX) 75 MG tablet, TAKE 1 TABLET BY MOUTH EVERY DAY, Disp: 90 tablet, Rfl: 3 .  ezetimibe-simvastatin (VYTORIN) 10-40 MG tablet, TAKE 1 TABLET BY MOUTH AT BEDTIME, Disp: 90 tablet, Rfl: 0 .  famotidine (PEPCID) 20 MG tablet, Take 1 tablet (20 mg total) by mouth at bedtime., Disp: 1 tablet, Rfl: 0 .  ferrous sulfate 325 (65 FE) MG tablet, TAKE 1 TABLET BY MOUTH EVERY DAY WITH BREAKFAST, Disp: 90 tablet, Rfl: 1 .  furosemide (LASIX) 20 MG tablet, TAKE 1 TABLET BY MOUTH EVERY DAY, Disp: 90 tablet, Rfl: 1 .  lisinopril (ZESTRIL) 5 MG tablet, TAKE 1 TABLET BY MOUTH  EVERY DAY, Disp: 90 tablet, Rfl: 2 .  metoprolol succinate (TOPROL-XL) 50 MG 24 hr tablet, TAKE 1 TABLET (50 MG TOTAL) BY MOUTH DAILY. TAKE WITH OR IMMEDIATELY FOLLOWING A MEAL., Disp: 90 tablet, Rfl: 3 .  nitroGLYCERIN (NITROSTAT) 0.4 MG SL tablet, Place 1 tablet (0.4 mg total) under the tongue every 5 (five) minutes x 3 doses as needed for chest pain., Disp: 30 tablet, Rfl: 3 .  omega-3 acid ethyl esters (LOVAZA) 1 g capsule, Take 1,000 mg by mouth at bedtime. Reported on 06/05/2015, Disp: , Rfl:  .  pantoprazole (PROTONIX) 40 MG tablet, Take 1 tablet (40 mg total) by mouth daily., Disp: 90 tablet, Rfl: 1 .  rOPINIRole (REQUIP) 2 MG tablet, Take 1 tablet (2 mg total) by mouth 2 (two) times daily., Disp: 60 tablet, Rfl: 11 .  tamsulosin (FLOMAX) 0.4 MG CAPS capsule, TAKE 1 CAPSULE BY MOUTH EVERY DAY, Disp: 90 capsule, Rfl: 3   Observations/Objective: Blood pressure (!) 150/60, pulse 64, temperature 98.3 F (36.8 C), temperature source Temporal, height 5\' 6"  (1.676 m), weight 131 lb 4 oz (59.5 kg), SpO2 98 %.  Physical Exam Constitutional:      Appearance: He is well-developed and underweight.  HENT:     Head: Normocephalic.     Right Ear: Hearing normal.     Left Ear: Hearing normal.     Nose: Nose normal.  Neck:     Thyroid: No thyroid mass or thyromegaly.     Vascular: No carotid bruit.     Trachea: Trachea normal.  Cardiovascular:     Rate and Rhythm: Normal rate and regular rhythm.     Pulses: Normal pulses.     Heart sounds: Heart sounds not distant. No murmur. No friction rub. No gallop.      Comments: No peripheral edema Pulmonary:     Effort: Pulmonary effort is normal. No respiratory distress.     Breath sounds: Normal breath sounds.  Skin:    General: Skin is warm and dry.     Findings: No rash.  Psychiatric:        Speech: Speech normal.        Behavior: Behavior normal.        Thought Content: Thought content normal.      Assessment and Plan The patient's  preventative maintenance and recommended screening tests for an annual wellness exam were reviewed in full today. Brought up to date unless services declined.  Counselled on the importance of diet, exercise, and its role in overall health and mortality. The patient's FH and SH was reviewed, including their home life, tobacco status, and drug and alcohol status.   Td, PNA: Due for td Has had COVID vaccine x 2. Shingles:not interested  PSA: Hx of BPH on tamsulosin. PSA not indicated  Colon: 09/2018 polyps  Dr. Hilarie Fredrickson, no further indicated  Smoker: spirometry performed 2017nml. Pulmonary nodules:04/2014 no change in nodules.  CTchest [id number] [facility]: showed no change inpulmonary nodules Lung cancer screening program referral placed   HTN (hypertension) Slightly above goal in office today.. will follow at home.  COPD, mild (HCC)  Stable symtpoms, minimal. Still smoking. Counsled to quit.. reviewed options. Pt palns to decrease over time.  Chronic systolic heart failure Followed by Cardiology. Last note reviewed.  Bilateral carotid artery stenosis S/P CEA, followed by vascular. LAst carotid duplex no blockage.  Abdominal aortic aneurysm (AAA) ([address]) Recent US minimal stenosis.  Hyperlipidemia LDL at goal on high dose statin.  Prediabetes Improved.    [name], MD

## 2019-06-05 NOTE — Assessment & Plan Note (Signed)
S/P CEA, followed by vascular. LAst carotid duplex no blockage.

## 2019-06-05 NOTE — Patient Instructions (Addendum)
Add protein shakes 1-2 a day to meals to work on gaining weight back.  Quit smoking.

## 2019-06-25 ENCOUNTER — Other Ambulatory Visit: Payer: Self-pay | Admitting: *Deleted

## 2019-06-25 DIAGNOSIS — Z87891 Personal history of nicotine dependence: Secondary | ICD-10-CM

## 2019-06-25 DIAGNOSIS — F17210 Nicotine dependence, cigarettes, uncomplicated: Secondary | ICD-10-CM

## 2019-07-18 ENCOUNTER — Encounter: Payer: Self-pay | Admitting: Acute Care

## 2019-07-18 ENCOUNTER — Ambulatory Visit (INDEPENDENT_AMBULATORY_CARE_PROVIDER_SITE_OTHER): Payer: Medicare Other | Admitting: Acute Care

## 2019-07-18 ENCOUNTER — Other Ambulatory Visit: Payer: Self-pay

## 2019-07-18 ENCOUNTER — Ambulatory Visit
Admission: RE | Admit: 2019-07-18 | Discharge: 2019-07-18 | Disposition: A | Discharge status: Home / Self Care | Payer: Medicare Other | Source: Ambulatory Visit | Attending: Acute Care | Admitting: Acute Care

## 2019-07-18 VITALS — BP 158/58 | HR 83 | Temp 98.3°F | Ht 66.0 in | Wt 127.2 lb

## 2019-07-18 DIAGNOSIS — Z122 Encounter for screening for malignant neoplasm of respiratory organs: Secondary | ICD-10-CM

## 2019-07-18 DIAGNOSIS — Z87891 Personal history of nicotine dependence: Secondary | ICD-10-CM | POA: Diagnosis not present

## 2019-07-18 DIAGNOSIS — F17210 Nicotine dependence, cigarettes, uncomplicated: Secondary | ICD-10-CM | POA: Diagnosis not present

## 2019-07-18 NOTE — Patient Instructions (Signed)
Thank you for participating in the [place] [facility] Screening Program. It was our pleasure to meet you today. We will call you with the results of your scan within the next few days. Your scan will be assigned a Lung RADS category score by the physicians reading the scans.  This Lung RADS score determines follow up scanning.  See below for description of categories, and follow up screening recommendations. We will be in touch to schedule your follow up screening annually or based on recommendations of our providers. We will fax a copy of your scan results to your Primary Care Physician, or the physician who referred you to the program, to ensure they have the results. Please call the office if you have any questions or concerns regarding your scanning experience or results.  Our office number is [phone]. Please speak with Denise Phelps, RN. She is our Lung Cancer Screening RN. If she is unavailable when you call, please have the office staff send her a message. She will return your call at her earliest convenience. Remember, if your scan is normal, we will scan you annually as long as you continue to meet the criteria for the program. (Age 55-77, Current smoker or smoker who has quit within the last 15 years). If you are a smoker, remember, quitting is the single most powerful action that you can take to decrease your risk of lung cancer and other pulmonary, breathing related problems. We know quitting is hard, and we are here to help.  Please let us know if there is anything we can do to help you meet your goal of quitting. If you are a former smoker, congratulations. We are proud of you! Remain smoke free! Remember you can refer friends or family members through the number above.  We will screen them to make sure they meet criteria for the program. Thank you for helping us take better care of you by participating in Lung Screening.  Lung RADS Categories:  Lung RADS 1: no nodules  or definitely non-concerning nodules.  Recommendation is for a repeat annual scan in 12 months.  Lung RADS 2:  nodules that are non-concerning in appearance and behavior with a very low likelihood of becoming an active cancer. Recommendation is for a repeat annual scan in 12 months.  Lung RADS 3: nodules that are probably non-concerning , includes nodules with a low likelihood of becoming an active cancer.  Recommendation is for a 6-month repeat screening scan. Often noted after an upper respiratory illness. We will be in touch to make sure you have no questions, and to schedule your 6-month scan.  Lung RADS 4 A: nodules with concerning findings, recommendation is most often for a follow up scan in 3 months or additional testing based on our provider's assessment of the scan. We will be in touch to make sure you have no questions and to schedule the recommended 3 month follow up scan.  Lung RADS 4 B:  indicates findings that are concerning. We will be in touch with you to schedule additional diagnostic testing based on our provider's  assessment of the scan.   

## 2019-07-18 NOTE — Progress Notes (Signed)
Shared Decision Making Visit Lung Cancer Screening Program [phone])   Eligibility:  Age 79 y.o.  Pack Years Smoking History Calculation  (# packs/per year x # years smoked)  Recent History of coughing up blood  no  Unexplained weight loss? no ( >Than 15 pounds within the last 6 months )  Prior History Lung / other cancer no (Diagnosis within the last 5 years already requiring surveillance chest CT Scans).  Smoking Status Current Smoker  Former Smokers: Years since quit: NA  Quit Date: NA  Visit Components:  Discussion included one or more decision making aids. yes  Discussion included risk/benefits of screening. yes  Discussion included potential follow up diagnostic testing for abnormal scans. yes  Discussion included meaning and risk of over diagnosis. yes  Discussion included meaning and risk of False Positives. yes  Discussion included meaning of total radiation exposure. yes  Counseling Included:  Importance of adherence to annual lung cancer LDCT screening. yes  Impact of comorbidities on ability to participate in the program. yes  Ability and willingness to under diagnostic treatment. yes  Smoking Cessation Counseling:  Current Smokers:   Discussed importance of smoking cessation. yes  Information about tobacco cessation classes and interventions provided to patient. yes  Patient provided with "ticket" for LDCT Scan. yes  Symptomatic Patient. no  Counseling  Diagnosis Code: Tobacco Use Z72.0  Asymptomatic Patient yes  Counseling (Intermediate counseling: > three minutes counseling) [id number]  Former Smokers:   Discussed the importance of maintaining cigarette abstinence. yes  Diagnosis Code: Personal History of Nicotine Dependence. [id number]  Information about tobacco cessation classes and interventions provided to patient. Yes  Patient provided with "ticket" for LDCT Scan. yes  Written Order for Lung Cancer Screening with LDCT placed in Epic.  Yes (CT Chest Lung Cancer Screening Low Dose W/O CM) [id number] Z12.2-Screening of respiratory organs Z87.891-Personal history of nicotine dependence  I have spent 25 minutes of face to face time with Mr. Mcauley discussing the risks and benefits of lung cancer screening. We viewed a power point together that explained in detail the above noted topics. We paused at intervals to allow for questions to be asked and answered to ensure understanding.We discussed that the single most powerful action that he can take to decrease his risk of developing lung cancer is to quit smoking. We discussed whether or not he is ready to commit to setting a quit date. We discussed options for tools to aid in quitting smoking including nicotine replacement therapy, non-nicotine medications, support groups, Quit Smart classes, and behavior modification. We discussed that often times setting smaller, more achievable goals, such as eliminating 1 cigarette a day for a week and then 2 cigarettes a day for a week can be helpful in slowly decreasing the number of cigarettes smoked. This allows for a sense of accomplishment as well as providing a clinical benefit. I gave him the " Be Stronger Than Your Excuses" card with contact information for community resources, classes, free nicotine replacement therapy, and access to mobile apps, text messaging, and on-line smoking cessation help. I have also given him my card and contact information in the event he needs to contact me. We discussed the time and location of the scan, and that either Doroteo Glassman RN or I will call with the results within 24-48 hours of receiving them. I have offered him  a copy of the power point we viewed  as a resource in the event they need reinforcement of the concepts we discussed  today in the office. The patient verbalized understanding of all of  the above and had no further questions upon leaving the office. They have my contact information in the event they  have any further questions.  I spent 3 minutes counseling on smoking cessation and the health risks of continued tobacco abuse.  I explained to the patient that there has been a high incidence of coronary artery disease noted on these exams. I explained that this is a non-gated exam therefore degree or severity cannot be determined. This patient is not currently on statin therapy. I have asked the patient to follow-up with their PCP regarding any incidental finding of coronary artery disease and management with diet or medication as their PCP  feels is clinically indicated. The patient verbalized understanding of the above and had no further questions upon completion of the visit.      Magdalen Spatz, NP 07/18/2019 10:52 AM

## 2019-07-19 ENCOUNTER — Telehealth: Payer: Self-pay | Admitting: *Deleted

## 2019-07-19 DIAGNOSIS — Z87891 Personal history of nicotine dependence: Secondary | ICD-10-CM

## 2019-07-19 DIAGNOSIS — F17210 Nicotine dependence, cigarettes, uncomplicated: Secondary | ICD-10-CM

## 2019-07-19 NOTE — Progress Notes (Signed)
I have attempted to call the patient with the results of his low-dose CT.  I attempted calling both the home and the cell number.  There was no answer at either number.  I did leave a HIPAA compliant message on the home phone with the office contact information, requesting that the patient call the office so that we can give him the results of his scan.  There was no option to leave a message on the mobile phone. Denise please place order for 47-month follow-up.,  And fax results to PCP Additionally he will need a referral to pulmonary for a new diagnosis of bronchiectasis, but I will arrange for that once I have spoken with him. Thank so much

## 2019-07-19 NOTE — Telephone Encounter (Signed)
Patient is returning Eric Form NP phone call. Patient phone number is [phone].

## 2019-07-20 NOTE — Telephone Encounter (Signed)
Spoke with pt and let him know that Erik Form, NP is working in the hospital today and will call him back when she gets a chance. I did advise pt that CT showed a pattern of possible infection/ inflammation that she wants to discuss with him. Pt verbalized understanding. Will forward message to Judson Roch.

## 2019-07-20 NOTE — Telephone Encounter (Signed)
This will not happen today. I will call him Monday. If you feel comfortable letting him know he needs a 6 month follow up CT, please let him know. He does need a to be added to my schedule for Wednesday if there are any openings ( tele visit) to discuss the infection/ inflammation, and possible treatment. Thanks [employer]

## 2019-07-20 NOTE — Telephone Encounter (Signed)
Spoke with pt and scheduled televisit with Eric Form, NP on 07/25/19 2:00. Will discuss CT order with Eric Form, NP. Leave in my box to f/u on.

## 2019-07-23 NOTE — Telephone Encounter (Signed)
Per Eric Form, NP ok to put in order for CT Chest w/o contrast with f/u low dose chest ct in comments. Order placed . Nothing further needed.

## 2019-07-25 ENCOUNTER — Ambulatory Visit (INDEPENDENT_AMBULATORY_CARE_PROVIDER_SITE_OTHER): Payer: Medicare Other | Admitting: Acute Care

## 2019-07-25 ENCOUNTER — Encounter: Payer: Self-pay | Admitting: Acute Care

## 2019-07-25 ENCOUNTER — Other Ambulatory Visit: Payer: Self-pay

## 2019-07-25 DIAGNOSIS — Z720 Tobacco use: Secondary | ICD-10-CM

## 2019-07-25 DIAGNOSIS — R911 Solitary pulmonary nodule: Secondary | ICD-10-CM | POA: Diagnosis not present

## 2019-07-25 DIAGNOSIS — J479 Bronchiectasis, uncomplicated: Secondary | ICD-10-CM | POA: Diagnosis not present

## 2019-07-25 DIAGNOSIS — F17210 Nicotine dependence, cigarettes, uncomplicated: Secondary | ICD-10-CM | POA: Diagnosis not present

## 2019-07-25 DIAGNOSIS — H02834 Dermatochalasis of left upper eyelid: Secondary | ICD-10-CM | POA: Diagnosis not present

## 2019-07-25 DIAGNOSIS — H02831 Dermatochalasis of right upper eyelid: Secondary | ICD-10-CM | POA: Diagnosis not present

## 2019-07-25 NOTE — Patient Instructions (Addendum)
It was good to talk with you today. We will do a 6 month follow up CT in November 2021 to re-evaluate the pulmonary nodule we are following. This has been scheduled for 11/5/021 We will refer you to [facility] for management of the new finding of bronchiectasis. You should get a call to schedule an appointment. Please work on quitting smoking. This is the single ost powerful action thata you can take to decrease your risk of lung cancer , heart disease, stroke, and worsening pulmonary disease. Follow up appointment pending per referral Please contact office for sooner follow up if symptoms do not improve or worsen or seek emergency care

## 2019-07-25 NOTE — Progress Notes (Signed)
Virtual Visit via Telephone Note  I connected with Erik [name]. on 07/25/19 at  2:00 PM EDT by telephone and verified that I am speaking with the correct person using two identifiers.  Location: Patient: At home Provider: Working remotely from home   I discussed the limitations, risks, security and privacy concerns of performing an evaluation and management service by telephone and the availability of in person appointments. I also discussed with the patient that there may be a patient responsible charge related to this service. The patient expressed understanding and agreed to proceed.  History of Present Illness Erik [name]. is a 79 y.o. male current smoker (  50 pack year smoking history) with COPD, HTN, chronic systolic heart failure, Bilateral coronary artery stenosis, AAA, Hyperlipidemia, and pre-diabetes.  He is  followed through the lung screening program.    07/25/2019 Pt. Presents for follow up for results of his LDCT scan for lung Cancer Screening. His scan done 07/18/2019 was read as a Lung  RADS 3, nodules that are probably benign findings, short term follow up suggested: includes nodules with a low likelihood of becoming a clinically active cancer. Radiology recommends a 6 month repeat LDCT follow up.We will order and schedule the 6 month follow up.   There was an incidental finding of bronchiectasis with a concern for MAI.noted on the scan. The patient states that he was not aware that he had bronchiectasis. He states that he does have a lot of mucus production especially in the mornings. He states that the amount of mucus he produces has increased from hs baseline  over the last few months. He does have a history of pneumonia as a child.I discussed the concern for MAI. I advised him that it would be prudent to have this evaluated by a pulmonary physician. I explained that maintenance therapy of flutter valve and mucolytic's helps, and that sputum culture to evaluate  for chronic bacteria is also part of the treatment. He is in agreement with referral to pulmonology for bronchiectasis.   He has already scheduled his 6 month follow up low dose CT to re-evaluate the 6.9 mm pulmonary nodule. He has had a weight loss of 15 pounds over the last year.   Test Results: LDCT 07/18/2019 Lung-RADS 3, probably benign findings. Dominant 6.9 mm apical right upper lobe solid pulmonary nodule. Short-term follow-up in 6 months is recommended with repeat low-dose chest CT without contrast (please use the following order, "CT CHEST LCS NODULE FOLLOW-UP W/O CM"). 2. 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 3. Three-vessel coronary atherosclerosis. 4. Stable right adrenal adenoma. 5. Aortic Atherosclerosis   CBC Latest Ref Rng & Units 05/01/2019 12/05/2018 08/11/2018  WBC 4.0 - 10.5 K/uL 11.9(H) 7.1 7.7  Hemoglobin 13.0 - 17.0 g/dL 13.1 12.8(L) 11.6(L)  Hematocrit 39.0 - 52.0 % 40.0 38.6(L) 36.5(L)  Platelets 150.0 - 400.0 K/uL 280.0 271.0 340    BMP Latest Ref Rng & Units 05/31/2019 08/18/2018 07/25/2018  Glucose 70 - 99 mg/dL 101(H) 111(H) 114(H)  BUN 6 - 23 mg/dL 18 19 18   Creatinine 0.40 - 1.50 mg/dL 0.98 0.90 0.99  BUN/Creat Ratio 10 - 24 - 21 -  Sodium 135 - 145 mEq/L 139 138 132(L)  Potassium 3.5 - 5.1 mEq/L 4.1 5.1 4.2  Chloride 96 - 112 mEq/L 104 105 97  CO2 19 - 32 mEq/L 31 23 28   Calcium 8.4 - 10.5  mg/dL 8.8 9.0 8.5    BNP    Component Value Date/Time   BNP 205.2 (H) 02/05/2015 1247    ProBNP    Component Value Date/Time   PROBNP 3,101.0 (H) 09/10/2013 0209    PFT No results found for: FEV1PRE, FEV1POST, FVCPRE, FVCPOST, TLC, DLCOUNC, PREFEV1FVCRT, PSTFEV1FVCRT  CT CHEST LUNG CA SCREEN LOW DOSE W/O CM  Result Date: 07/18/2019 CLINICAL DATA:  79 year old asymptomatic male current smoker with 54 pack-year smoking history. EXAM: CT CHEST  WITHOUT CONTRAST LOW-DOSE FOR LUNG CANCER SCREENING TECHNIQUE: Multidetector CT imaging of the chest was performed following the standard protocol without IV contrast. COMPARISON:  04/22/2014 chest CT. FINDINGS: Cardiovascular: Normal heart size. No significant pericardial effusion/thickening. Three-vessel coronary atherosclerosis. Atherosclerotic nonaneurysmal thoracic aorta. Normal caliber pulmonary arteries. Mediastinum/Nodes: Stable hypodense 1.6 cm left thyroid nodule. Unremarkable esophagus. No pathologically enlarged axillary, mediastinal or hilar lymph nodes, noting limited sensitivity for the detection of hilar adenopathy on this noncontrast study. Lungs/Pleura: No pneumothorax. No pleural effusion. Mild centrilobular emphysema with mild diffuse bronchial wall thickening. No acute consolidative airspace disease or lung masses. Scattered solid pulmonary nodules in both lungs, largest 6.9 mm in volume derived mean diameter in the apical right upper lobe (series 3/image 61). Mild cylindrical bronchiectasis with associated mild patchy tree-in-bud opacities in the basilar right upper lobe, right middle lobe, lingula and anterior left lower lobe. Upper abdomen: Cholecystectomy. Right adrenal 1.4 cm nodule with density -7 HU, stable. Musculoskeletal: No aggressive appearing focal osseous lesions. Mild thoracic spondylosis. IMPRESSION: 1. Lung-RADS 3, probably benign findings. Dominant 6.9 mm apical right upper lobe solid pulmonary nodule. Short-term follow-up in 6 months is recommended with repeat low-dose chest CT without contrast (please use the following order, "CT CHEST LCS NODULE FOLLOW-UP W/O CM"). 2. 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 3. Three-vessel coronary atherosclerosis. 4. Stable right adrenal adenoma. 5. Aortic Atherosclerosis (ICD10-I70.0) and Emphysema (ICD10-J43.9). Electronically  Signed   By: Ilona Sorrel M.D.   On: 07/18/2019 13:19     Past medical hx Past Medical History:  Diagnosis Date  . Adenomatous colon polyp   . Aortic aneurysm ([place])   . BPH (benign prostatic hypertrophy)   . Carotid artery stenosis    a. Bilateral CEA  . Cataract   . CHF (congestive heart failure) ([place])   . Chronic systolic heart failure ([facility])    a. EF 20-25%, mild LVH, mod HK, mid apicalanteroseptal myocardium, mild MR, LA mod dilated  . Collagen vascular disease ()   . COPD (chronic obstructive pulmonary disease) ([place])   . Coronary artery disease    a. LHC (08/2013): Lmain: short 30% distal, LAD: sml D1 & D2, 70% ostial D1, 95-99% LAD stenosis prox D2 LCx: sml/mod ramus subtot. occluded, 40% ostial set off lg OM1, 40% AV LCx after OM1, RCA: 90% prox (DES to RCA and prox LAD)  . Diverticulosis   . GERD (gastroesophageal reflux disease)   . Heart murmur   . History of colonic polyps   . Hyperlipidemia   . Hyperplastic colon polyp   . Hypertension   . Ischemic cardiomyopathy   . RLS (restless legs syndrome)      Social History   Tobacco Use  . Smoking status: Current Every Day Smoker    Packs/day: 1.00    Years: 50.00    Pack years: 50.00    Types: Cigarettes    Last attempt to quit: 08/27/2013  Years since quitting: 5.9  . Smokeless tobacco: Former Systems developer    Quit date: 09/10/2013  . Tobacco comment: off and on since age 48  Substance Use Topics  . Alcohol use: No  . Drug use: No    Mr.Rosen reports that he has been smoking cigarettes. He has a 50.00 pack-year smoking history. He quit smokeless tobacco use about 5 years ago. He reports that he does not drink alcohol or use drugs.  Tobacco Cessation: Current every day smoker , 50 pack year smoking history Past surgical hx, Family hx, Social hx all reviewed.  Current Outpatient Medications on File Prior to Visit  Medication Sig  . acetaminophen (TYLENOL) 325 MG tablet Take 650 mg by mouth every 6 (six) hours as  needed (for pain.).   Marland Kitchen aspirin EC 81 MG tablet Take 81 mg by mouth every evening.   . clopidogrel (PLAVIX) 75 MG tablet TAKE 1 TABLET BY MOUTH EVERY DAY  . ezetimibe-simvastatin (VYTORIN) 10-40 MG tablet TAKE 1 TABLET BY MOUTH AT BEDTIME  . famotidine (PEPCID) 20 MG tablet Take 1 tablet (20 mg total) by mouth at bedtime.  . ferrous sulfate 325 (65 FE) MG tablet TAKE 1 TABLET BY MOUTH EVERY DAY WITH BREAKFAST  . furosemide (LASIX) 20 MG tablet TAKE 1 TABLET BY MOUTH EVERY DAY  . lisinopril (ZESTRIL) 5 MG tablet TAKE 1 TABLET BY MOUTH EVERY DAY  . metoprolol succinate (TOPROL-XL) 50 MG 24 hr tablet TAKE 1 TABLET (50 MG TOTAL) BY MOUTH DAILY. TAKE WITH OR IMMEDIATELY FOLLOWING A MEAL.  . nitroGLYCERIN (NITROSTAT) 0.4 MG SL tablet Place 1 tablet (0.4 mg total) under the tongue every 5 (five) minutes x 3 doses as needed for chest pain.  Marland Kitchen omega-3 acid ethyl esters (LOVAZA) 1 g capsule Take 1,000 mg by mouth at bedtime. Reported on 06/05/2015  . pantoprazole (PROTONIX) 40 MG tablet Take 1 tablet (40 mg total) by mouth daily.  Marland Kitchen rOPINIRole (REQUIP) 2 MG tablet Take 1 tablet (2 mg total) by mouth 2 (two) times daily.  . tamsulosin (FLOMAX) 0.4 MG CAPS capsule TAKE 1 CAPSULE BY MOUTH EVERY DAY   No current facility-administered medications on file prior to visit.     Allergies  Allergen Reactions  . Codeine Swelling    throat swells    Review Of Systems:  Constitutional:    +  weight loss,  No night sweats,  Fevers, chills, fatigue, or  lassitude.  HEENT:   No headaches,  Difficulty swallowing,  Tooth/dental problems, or  Sore throat,                No sneezing, itching, ear ache, nasal congestion, post nasal drip,   CV:  No chest pain,  Orthopnea, PND, swelling in lower extremities, anasarca, dizziness, palpitations, syncope.   GI  No heartburn, indigestion, abdominal pain, nausea, vomiting, diarrhea, change in bowel habits, loss of appetite, bloody stools.   Resp: No shortness of breath  with exertion or at rest.  + excess mucus, + productive cough,  No non-productive cough,  No coughing up of blood.  No change in color of mucus.  No wheezing.  No chest wall deformity  Skin: no rash or lesions.  GU: no dysuria, change in color of urine, no urgency or frequency.  No flank pain, no hematuria   MS:  No joint pain or swelling.  No decreased range of motion.  No back pain.  Psych:  No change in mood or affect. No depression or  anxiety.  No memory loss.    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 Plan Referral to Pulmonary for consultation re Bronchiectasis>> Dr. Carlis Abbott Plan for sputum Cx/ Fungal AFB Maintenance treatment of Mucinex 1200 mg daily with full glass of water Flutter Valve QID Consider chest vest.    I discussed the assessment and treatment plan with the patient. The patient was provided an opportunity to ask questions and all were answered. The patient agreed with the plan and demonstrated an understanding of the instructions.   The patient was advised to call back or seek an in-person evaluation if the symptoms worsen or if the condition fails to improve as anticipated.  I provided 35 minutes of non-face-to-face time during this encounter.   Magdalen Spatz, NP 07/25/2019  11:24 AM

## 2019-07-29 ENCOUNTER — Other Ambulatory Visit: Payer: Self-pay | Admitting: Family Medicine

## 2019-07-30 ENCOUNTER — Other Ambulatory Visit (HOSPITAL_COMMUNITY): Payer: Self-pay | Admitting: Cardiology

## 2019-08-16 DIAGNOSIS — H02834 Dermatochalasis of left upper eyelid: Secondary | ICD-10-CM | POA: Diagnosis not present

## 2019-08-16 DIAGNOSIS — H02831 Dermatochalasis of right upper eyelid: Secondary | ICD-10-CM | POA: Diagnosis not present

## 2019-08-22 ENCOUNTER — Other Ambulatory Visit: Payer: Self-pay

## 2019-08-22 ENCOUNTER — Encounter: Payer: Self-pay | Admitting: Critical Care Medicine

## 2019-08-22 ENCOUNTER — Ambulatory Visit (INDEPENDENT_AMBULATORY_CARE_PROVIDER_SITE_OTHER): Payer: Medicare Other | Admitting: Critical Care Medicine

## 2019-08-22 VITALS — BP 140/50 | HR 72 | Temp 98.0°F | Ht 66.0 in | Wt 124.2 lb

## 2019-08-22 DIAGNOSIS — R918 Other nonspecific abnormal finding of lung field: Secondary | ICD-10-CM | POA: Diagnosis not present

## 2019-08-22 DIAGNOSIS — R590 Localized enlarged lymph nodes: Secondary | ICD-10-CM

## 2019-08-22 DIAGNOSIS — Z720 Tobacco use: Secondary | ICD-10-CM

## 2019-08-22 MED ORDER — VARENICLINE TARTRATE 0.5 MG X 11 & 1 MG X 42 PO MISC: ORAL | 0 refills | Status: DC

## 2019-08-22 MED ORDER — VARENICLINE TARTRATE 1 MG PO TABS: 1.0000 mg | ORAL_TABLET | Freq: Two times a day (BID) | ORAL | 3 refills | Status: DC

## 2019-08-22 MED ORDER — ALBUTEROL SULFATE HFA 108 (90 BASE) MCG/ACT IN AERS: 2.0000 | INHALATION_SPRAY | RESPIRATORY_TRACT | 11 refills | Status: DC | PRN

## 2019-08-22 MED ORDER — STIOLTO RESPIMAT 2.5-2.5 MCG/ACT IN AERS: 2.0000 | INHALATION_SPRAY | Freq: Every day | RESPIRATORY_TRACT | 11 refills | Status: DC

## 2019-08-22 NOTE — Patient Instructions (Addendum)
Thank you for visiting Dr. Carlis Abbott at [facility]. We recommend the following: Orders Placed This Encounter  Procedures  . AFB Culture & Smear  . US SOFT TISSUE HEAD & NECK (NON-THYROID)  . Pulmonary Function Test   Orders Placed This Encounter  Procedures  . AFB Culture & Smear    Standing Status:   Future    Standing Expiration Date:   08/21/2020  . US SOFT TISSUE HEAD & NECK (NON-THYROID)    Standing Status:   Future    Standing Expiration Date:   08/21/2020    Scheduling Instructions:     First available    Order Specific Question:   Reason for Exam (SYMPTOM  OR DIAGNOSIS REQUIRED)    Answer:   R upper cervical enlarged LN    Order Specific Question:   Preferred imaging location?    Answer:   [facility]  . Pulmonary Function Test    Standing Status:   Future    Standing Expiration Date:   08/21/2020    Order Specific Question:   Where should this test be performed?    Answer:   [facility]    Order Specific Question:   Full PFT: includes the following: basic spirometry, spirometry pre & post bronchodilator, diffusion capacity (DLCO), lung volumes    Answer:   Full PFT    Meds ordered this encounter  Medications  . Tiotropium Bromide-Olodaterol (STIOLTO RESPIMAT) 2.5-2.5 MCG/ACT AERS    Sig: Inhale 2 puffs into the lungs daily.    Dispense:  4 g    Refill:  11  . albuterol (VENTOLIN HFA) 108 (90 Base) MCG/ACT inhaler    Sig: Inhale 2 puffs into the lungs every 4 (four) hours as needed for wheezing or shortness of breath.    Dispense:  18 g    Refill:  11  . varenicline (CHANTIX PAK) 0.5 MG X 11 & 1 MG X 42 tablet    Sig: Take one 0.5 mg tablet by mouth once daily for 3 days, then increase to one 0.5 mg tablet twice daily for 4 days, then increase to one 1 mg tablet twice daily.    Dispense:  53 tablet    Refill:  0  . varenicline (CHANTIX CONTINUING MONTH PAK) 1 MG tablet    Sig: Take 1 tablet (1 mg total) by mouth 2 (two) times daily.    Dispense:  60  tablet    Refill:  3    Return in about 2 months (around 10/22/2019).    Please do your part to reduce the spread of COVID-19.

## 2019-08-22 NOTE — Progress Notes (Signed)
Synopsis: Referred in June 2021 for abnormal CT chest by Erik Sanders, MD.  Subjective:   PATIENT ID: Erik [name]. GENDER: male DOB: 1940-06-26, MRN: [id number]  Chief Complaint  Patient presents with  . Consult    Patient has shortness of breath with exertion. Patient was sent by Erik [name] for Bronchiectasis. Patient has cough with white sputum.     Erik [name] is a 79 year old gentleman who presents for evaluation of an abnormal chest CT by the lung cancer screening program.  He presents today with his wife Erik [name].  He has been told the past he has COPD, but has never completed PFTs been prescribed an inhaler.  He has chronic dyspnea on exertion that causes him to walk slower than he used to and makes it hard to walk up steps or hills.  He is able to walk as far as he wants if he walks slowly.  He occasionally has wheezing, has a productive cough of clear sputum, mostly first thing in the morning.  He has had weight loss over the last year-18 to 20 pounds which he attributes to GI symptoms.  He has had diarrhea and loose stools shortly after eating.  He is being evaluated by gastroenterology.  He denies fever, chills, sweats, discolored sputum he continues to smoke 1 to 1.5 packs/day, down from his heaviest to 2 packs/day.  Has been smoking for about 55 years and is interested in quitting.  He has tried nicotine replacement therapy and quitting cold [place] in the past.  His family history is notable for emphysema in his father.  He recently underwent lung cancer screening CT which demonstrated possible bronchiectasis and findings concerning for chronic infection.     Past Medical History:  Diagnosis Date  . Adenomatous colon polyp   . Aortic aneurysm ([place])   . BPH (benign prostatic hypertrophy)   . Carotid artery stenosis    a. Bilateral CEA  . Cataract   . CHF (congestive heart failure) ([place])   . Chronic systolic heart failure (HCC)    a. EF 20-25%, mild LVH, mod HK, mid  apicalanteroseptal myocardium, mild MR, LA mod dilated  . Collagen vascular disease ([place])   . COPD (chronic obstructive pulmonary disease) ([place])   . Coronary artery disease    a. LHC (08/2013): [place]: short 30% distal, LAD: sml D1 & D2, 70% ostial D1, 95-99% LAD stenosis prox D2 LCx: sml/mod ramus subtot. occluded, 40% ostial set off lg OM1, 40% AV LCx after OM1, RCA: 90% prox (DES to RCA and prox LAD)  . Diverticulosis   . GERD (gastroesophageal reflux disease)   . Heart murmur   . History of colonic polyps   . Hyperlipidemia   . Hyperplastic colon polyp   . Hypertension   . Ischemic cardiomyopathy   . RLS (restless legs syndrome)      Family History  Problem Relation Age of Onset  . Heart disease Brother   . Hyperlipidemia Brother   . Pancreatic cancer Brother   . Hypertension Mother   . Cancer Father        unknown type; sounds GI  . Emphysema Father   . Hyperlipidemia Brother   . Hyperlipidemia Brother   . Heart attack Brother   . Hyperlipidemia Brother   . Multiple sclerosis Brother   . Kidney disease Neg Hx   . Prostate cancer Neg Hx   . Colon cancer Neg Hx   . Esophageal cancer Neg Hx   . Rectal  cancer Neg Hx   . Stomach cancer Neg Hx      Past Surgical History:  Procedure Laterality Date  . cardiac stents  09-2013  . CAROTID ENDARTERECTOMY  04/17/2008   right  . CAROTID ENDARTERECTOMY  05/30/08   Left  . CATARACT EXTRACTION W/PHACO Right 10/05/2018   Procedure: CATARACT EXTRACTION PHACO AND INTRAOCULAR LENS PLACEMENT ([place]), RIGHT;  Surgeon: Birder Robson, MD;  Location: [facility] ORS;  Service: Ophthalmology;  Laterality: Right;  [place] 01:06.4 cde 12.79 Fluid Pack Lot # [id number]  . CATARACT EXTRACTION W/PHACO Left 11/02/2018   Procedure: CATARACT EXTRACTION PHACO AND INTRAOCULAR LENS PLACEMENT ([place]), LEFT;  Surgeon: Birder Robson, MD;  Location: [facility] ORS;  Service: Ophthalmology;  Laterality: Left;  [place]  01:10 CDE 11.52 Fluid pack lot # [id number]  .  CHOLECYSTECTOMY     Gall Bladder  . CORONARY ANGIOGRAM  09/11/2013   Procedure: CORONARY ANGIOGRAM;  Surgeon: Troy Sine, MD;  Location: [facility] CATH LAB;  Service: Cardiovascular;;  . CORONARY ANGIOPLASTY     STENTS  . HERNIA REPAIR    . lower aorta aneurysm  05/26/2015   [facility]  . PERCUTANEOUS STENT INTERVENTION  09/11/2013   Procedure: PERCUTANEOUS STENT INTERVENTION;  Surgeon: Troy Sine, MD;  Location: [facility] CATH LAB;  Service: Cardiovascular;;  DES Prox RCA 3.5x15 xience     Social History   Socioeconomic History  . Marital status: Married    Spouse name: Not on file  . Number of children: Not on file  . Years of education: Not on file  . Highest education level: Not on file  Occupational History  . Occupation: Retired Development worker, community  Tobacco Use  . Smoking status: Current Every Day Smoker    Packs/day: 1.00    Years: 50.00    Pack years: 50.00    Types: Cigarettes    Last attempt to quit: 08/27/2013    Years since quitting: 5.9  . Smokeless tobacco: Former Systems developer    Quit date: 09/10/2013  . Tobacco comment: off and on since age 4  Substance and Sexual Activity  . Alcohol use: No  . Drug use: No  . Sexual activity: Yes  Other Topics Concern  . Not on file  Social History Narrative   Lives with wife in [place]. Retired from the post office   Social Determinants of Radio broadcast assistant Strain: Low Risk   . Difficulty of Paying Living Expenses: Not hard at all  Food Insecurity: No Food Insecurity  . Worried About Charity fundraiser in the Last Year: Never true  . Ran Out of Food in the Last Year: Never true  Transportation Needs: No Transportation Needs  . Lack of Transportation (Medical): No  . Lack of Transportation (Non-Medical): No  Physical Activity: Inactive  . Days of Exercise per Week: 0 days  . Minutes of Exercise per Session: 0 min  Stress: No Stress Concern Present  . Feeling of Stress : Not at all  Social Connections:   . Frequency of  Communication with Friends and Family:   . Frequency of Social Gatherings with Friends and Family:   . Attends Religious Services:   . Active Member of Clubs or Organizations:   . Attends Archivist Meetings:   Marland Kitchen Marital Status:   Intimate Partner Violence: Not At Risk  . Fear of Current or Ex-Partner: No  . Emotionally Abused: No  . Physically Abused: No  . Sexually Abused: No     Allergies  Allergen Reactions  . Codeine Swelling    throat swells     Immunization History  Administered Date(s) Administered  . PFIZER SARS-COV-2 Vaccination 04/07/2019, 04/28/2019  . Pneumococcal Conjugate-13 04/16/2014  . Pneumococcal Polysaccharide-23 06/05/2008  . Td 06/05/2008    Outpatient Medications Prior to Visit  Medication Sig Dispense Refill  . acetaminophen (TYLENOL) 325 MG tablet Take 650 mg by mouth every 6 (six) hours as needed (for pain.).     Marland Kitchen aspirin EC 81 MG tablet Take 81 mg by mouth every evening.     . clopidogrel (PLAVIX) 75 MG tablet Take 1 tablet (75 mg total) by mouth daily. Needs appt for further refills 90 tablet 0  . ezetimibe-simvastatin (VYTORIN) 10-40 MG tablet TAKE 1 TABLET BY MOUTH AT BEDTIME 90 tablet 3  . famotidine (PEPCID) 20 MG tablet Take 1 tablet (20 mg total) by mouth at bedtime. 1 tablet 0  . ferrous sulfate 325 (65 FE) MG tablet TAKE 1 TABLET BY MOUTH EVERY DAY WITH BREAKFAST 90 tablet 1  . furosemide (LASIX) 20 MG tablet TAKE 1 TABLET BY MOUTH EVERY DAY 90 tablet 1  . lisinopril (ZESTRIL) 5 MG tablet TAKE 1 TABLET BY MOUTH EVERY DAY 90 tablet 2  . metoprolol succinate (TOPROL-XL) 50 MG 24 hr tablet TAKE 1 TABLET (50 MG TOTAL) BY MOUTH DAILY. TAKE WITH OR IMMEDIATELY FOLLOWING A MEAL. 90 tablet 3  . nitroGLYCERIN (NITROSTAT) 0.4 MG SL tablet Place 1 tablet (0.4 mg total) under the tongue every 5 (five) minutes x 3 doses as needed for chest pain. 30 tablet 3  . omega-3 acid ethyl esters (LOVAZA) 1 g capsule Take 1,000 mg by mouth at bedtime.  Reported on 06/05/2015    . pantoprazole (PROTONIX) 40 MG tablet Take 1 tablet (40 mg total) by mouth daily. 90 tablet 1  . rOPINIRole (REQUIP) 2 MG tablet Take 1 tablet (2 mg total) by mouth 2 (two) times daily. 60 tablet 11  . tamsulosin (FLOMAX) 0.4 MG CAPS capsule TAKE 1 CAPSULE BY MOUTH EVERY DAY 90 capsule 3   No facility-administered medications prior to visit.    Review of Systems  Constitutional: Positive for weight loss. Negative for chills and fever.  Respiratory: Positive for cough, sputum production, shortness of breath and wheezing.   Cardiovascular: Negative for chest pain and leg swelling.  Gastrointestinal: Positive for diarrhea. Negative for nausea and vomiting.  Musculoskeletal: Negative for joint pain and myalgias.  Skin: Negative for rash.  Neurological: Negative for weakness.     Objective:   Vitals:   08/22/19 1140  BP: (!) 140/50  Pulse: 72  Temp: 98 F (36.7 C)  TempSrc: Oral  SpO2: 98%  Weight: 124 lb 3.2 oz (56.3 kg)  Height: 5\' 6"  (1.676 m)   98% on   RA BMI Readings from Last 3 Encounters:  08/22/19 20.05 kg/m  07/18/19 20.53 kg/m  06/05/19 21.18 kg/m   Wt Readings from Last 3 Encounters:  08/22/19 124 lb 3.2 oz (56.3 kg)  07/18/19 127 lb 3.2 oz (57.7 kg)  06/05/19 131 lb 4 oz (59.5 kg)    Physical Exam Vitals reviewed.  Constitutional:      General: He is not in acute distress.    Appearance: Normal appearance. He is not ill-appearing.  HENT:     Head: Normocephalic and atraumatic.  Eyes:     General: No scleral icterus. Neck:     Comments: 1 mildly enlarged mobile lymph node in right upper neck under earlobe.  Cardiovascular:     Rate and Rhythm: Normal rate and regular rhythm.     Heart sounds: Murmur present.  Pulmonary:     Comments: Breathing comfortably on room air, no conversational dyspnea.  Prolonged exhalation, distant breath sounds.  Speaking in full sentences. Abdominal:     General: There is no distension.      Palpations: Abdomen is soft.     Tenderness: There is no abdominal tenderness.  Musculoskeletal:        General: No swelling or deformity.     Cervical back: Neck supple.  Skin:    General: Skin is warm and dry.     Findings: No rash.  Neurological:     General: No focal deficit present.     Mental Status: He is alert.     Coordination: Coordination normal.  Psychiatric:        Mood and Affect: Mood normal.        Behavior: Behavior normal.      CBC    Component Value Date/Time   WBC 11.9 (H) 05/01/2019 1059   RBC 4.61 05/01/2019 1059   HGB 13.1 05/01/2019 1059   HCT 40.0 05/01/2019 1059   PLT 280.0 05/01/2019 1059   MCV 86.6 05/01/2019 1059   MCH 27.3 08/11/2018 0932   MCHC 32.7 05/01/2019 1059   RDW 14.3 05/01/2019 1059   LYMPHSABS 1.1 05/01/2019 1059   MONOABS 1.1 (H) 05/01/2019 1059   EOSABS 0.3 05/01/2019 1059   BASOSABS 0.1 05/01/2019 1059    CHEMISTRY No results for input(s): NA, K, CL, CO2, GLUCOSE, BUN, CREATININE, CALCIUM, MG, PHOS in the last 168 hours. CrCl cannot be calculated (Patient's most recent lab result is older than the maximum 21 days allowed.).   Chest Imaging- films reviewed: Low-dose CT chest 07/18/2019-biapical scarring, right upper lobe irregular nodule.  Airway thickening with minimal bronchiectasis.  Most airways tapering normally.  Focal areas of tree-in-bud nodules right middle lobe, inferior lingula anterior left lower lobe.  Left thyroid nodule.  Senescent vascular calcification, aortic endovascular graft in place. No obvious mediastinal or hilar adenopathy.  Pulmonary Functions Testing Results: No flowsheet data found.   Echocardiogram 07/26/2018:   NM SPECT 08/18/2018: LVEF 30 to 44%, nuclear stress EF 40%.  No ischemic changes during stress.  Stable left bundle branch block with paradoxical septal motion.  Medium defect of mild severity in the apical septum and apex.  Low risk study.    Assessment & Plan:     ICD-10-CM   1.  Abnormal CT scan, lung  R91.8   2. Tobacco abuse  Z72.0     Abnormal chest CT-pulmonary nodules, diffuse airway thickening, both normally tapering vessels I am hesitant to call this bronchiectasis.  His symptoms age, and emphysema on CT scan are more suggestive of chronic bronchitis. -Reviewed the CT scan during the visit. -Start Stiolto once daily -Start albuterol every 4 hours as needed -PFTs ordered -AFB Sputum culture to rule out NTM.   Tobacco abuse -Strongly recommend cutting back or complete cessation.  We discussed this at length.  He is interested in trying Chantix.  We discussed the concerning side effects of nightmares, depression, suicidal ideations.  If he develops these he should stop the medication immediately.  Pulmonary nodules -Follow-up per lung nodule clinic.  Enlarged right cervical lymph node; concerning in an active smoker -Ultrasound of LN  RTC in 2 months.   Current Outpatient Medications:  .  acetaminophen (TYLENOL) 325 MG tablet, Take 650 mg  by mouth every 6 (six) hours as needed (for pain.). , Disp: , Rfl:  .  aspirin EC 81 MG tablet, Take 81 mg by mouth every evening. , Disp: , Rfl:  .  clopidogrel (PLAVIX) 75 MG tablet, Take 1 tablet (75 mg total) by mouth daily. Needs appt for further refills, Disp: 90 tablet, Rfl: 0 .  ezetimibe-simvastatin (VYTORIN) 10-40 MG tablet, TAKE 1 TABLET BY MOUTH AT BEDTIME, Disp: 90 tablet, Rfl: 3 .  famotidine (PEPCID) 20 MG tablet, Take 1 tablet (20 mg total) by mouth at bedtime., Disp: 1 tablet, Rfl: 0 .  ferrous sulfate 325 (65 FE) MG tablet, TAKE 1 TABLET BY MOUTH EVERY DAY WITH BREAKFAST, Disp: 90 tablet, Rfl: 1 .  furosemide (LASIX) 20 MG tablet, TAKE 1 TABLET BY MOUTH EVERY DAY, Disp: 90 tablet, Rfl: 1 .  lisinopril (ZESTRIL) 5 MG tablet, TAKE 1 TABLET BY MOUTH EVERY DAY, Disp: 90 tablet, Rfl: 2 .  metoprolol succinate (TOPROL-XL) 50 MG 24 hr tablet, TAKE 1 TABLET (50 MG TOTAL) BY MOUTH DAILY. TAKE WITH OR IMMEDIATELY  FOLLOWING A MEAL., Disp: 90 tablet, Rfl: 3 .  nitroGLYCERIN (NITROSTAT) 0.4 MG SL tablet, Place 1 tablet (0.4 mg total) under the tongue every 5 (five) minutes x 3 doses as needed for chest pain., Disp: 30 tablet, Rfl: 3 .  omega-3 acid ethyl esters (LOVAZA) 1 g capsule, Take 1,000 mg by mouth at bedtime. Reported on 06/05/2015, Disp: , Rfl:  .  pantoprazole (PROTONIX) 40 MG tablet, Take 1 tablet (40 mg total) by mouth daily., Disp: 90 tablet, Rfl: 1 .  rOPINIRole (REQUIP) 2 MG tablet, Take 1 tablet (2 mg total) by mouth 2 (two) times daily., Disp: 60 tablet, Rfl: 11 .  tamsulosin (FLOMAX) 0.4 MG CAPS capsule, TAKE 1 CAPSULE BY MOUTH EVERY DAY, Disp: 90 capsule, Rfl: 3     Julian Hy, DO [facility] 08/22/2019 12:09 PM

## 2019-08-23 ENCOUNTER — Other Ambulatory Visit: Payer: Self-pay | Admitting: Family Medicine

## 2019-08-24 ENCOUNTER — Other Ambulatory Visit: Payer: Medicare Other

## 2019-08-24 DIAGNOSIS — R918 Other nonspecific abnormal finding of lung field: Secondary | ICD-10-CM

## 2019-08-27 ENCOUNTER — Telehealth: Payer: Self-pay | Admitting: Critical Care Medicine

## 2019-08-27 NOTE — Telephone Encounter (Signed)
Spoke with pt, states that he has an upcoming U/S on 6/16 for his neck (chart shows that this U/S was ordered by Dr. Carlis Abbott), states that he forgot to mention a similar spot to the one being viewed via U/S on his back.  States it is on his upper back left side, approx 1 inch away from his spine.  Pt wants to know if Dr. Carlis Abbott would like this to be viewed under U/S as well. Pt unable to say how long it is has been present.   Dr. Carlis Abbott please advise.  Thanks.

## 2019-08-29 ENCOUNTER — Other Ambulatory Visit: Payer: Self-pay

## 2019-08-29 ENCOUNTER — Telehealth: Payer: Self-pay | Admitting: Critical Care Medicine

## 2019-08-29 ENCOUNTER — Ambulatory Visit (HOSPITAL_COMMUNITY)
Admission: RE | Admit: 2019-08-29 | Discharge: 2019-08-29 | Disposition: A | Discharge status: Home / Self Care | Payer: Medicare Other | Source: Ambulatory Visit | Attending: Critical Care Medicine | Admitting: Critical Care Medicine

## 2019-08-29 DIAGNOSIS — R590 Localized enlarged lymph nodes: Secondary | ICD-10-CM

## 2019-08-29 DIAGNOSIS — Z720 Tobacco use: Secondary | ICD-10-CM

## 2019-08-29 NOTE — Telephone Encounter (Signed)
No we only need to look at the neck for now. He should discuss the back with his PCP. Thanks!  LPC

## 2019-08-29 NOTE — Telephone Encounter (Signed)
He can hold Plavix for 5 days for biopsy.

## 2019-08-29 NOTE — Telephone Encounter (Signed)
I called [name] to discuss his neck US results. He was unavailable but I left a message for him to please call back.   US-guided LN biopsy ordered.   Julian Hy, DO 08/29/19 3:16 PM [facility]

## 2019-08-29 NOTE — Telephone Encounter (Signed)
Spoke with patient. He verbalized understanding.   Nothing further needed at time of call.  

## 2019-08-29 NOTE — Telephone Encounter (Signed)
Discussed [place] results and biopsy that has been ordered. He should discuss with Dr. Aundra Dubin if he can stop his plavix for 5 days in preparation for his biopsy.  Julian Hy, DO 08/29/19 3:31 PM [facility]

## 2019-08-30 ENCOUNTER — Telehealth (HOSPITAL_COMMUNITY): Payer: Self-pay | Admitting: *Deleted

## 2019-08-30 ENCOUNTER — Encounter (HOSPITAL_COMMUNITY): Payer: Self-pay | Admitting: Radiology

## 2019-08-30 ENCOUNTER — Telehealth: Payer: Self-pay | Admitting: Critical Care Medicine

## 2019-08-30 NOTE — Telephone Encounter (Signed)
Can you please call [name] and let him know that Dr. Aundra Dubin said he is fine to hold his plavix for 5 days before the biopsy. Thanks!  LPC

## 2019-08-30 NOTE — Telephone Encounter (Signed)
I called and left a message for Nira Conn RN stating what the patient told us.  I requested a call back and/or fax of what they need for his upcoming surgery as far as his plavix and left our telephone number and fax #.  Will await further information from the HF clinic.

## 2019-08-30 NOTE — Telephone Encounter (Signed)
Ok to hold Plavix for 5 days prior to biopsy and restart afterwards.

## 2019-08-30 NOTE — Telephone Encounter (Signed)
Pt left VM stating he is having a biopsy with pulmonary and was told to ask Dr.McLean if it was ok to hold plavix and if so for how long.  Routed to [place] for advice

## 2019-08-30 NOTE — Progress Notes (Signed)
Erik [name]. Erik [name]" Male, 79 y.o., 12/08/1940 MRN:  [id number] Phone:  [phone] (H) PCP:  [name], MD Primary Cvg:  Medicare/Medicare Part A And B Next Appt With Radiology ([facility] CT 1) 01/18/2020 at 9:30 AM  RE: [place] Core Biopsy (Lymph Nodes) Received: Duaine Dredge, MD  [name]   [place] core cerv LAN  See prev [place]  R/o lymphoma   DDH       Previous Messages   ----- Message -----  From: Garth Bigness D  Sent: 08/29/2019  3:24 PM EDT  To: [facility] Procedure Requests  Subject: [place] Core Biopsy (Lymph Nodes)           Procedure:  [place] Core Biopsy (Lymph Nodes)   Reason: Cervical adenopathy   History: [place] in computer   Provider: Julian Hy   Provider Contact: [phone]

## 2019-08-31 ENCOUNTER — Encounter (HOSPITAL_COMMUNITY): Payer: Self-pay | Admitting: *Deleted

## 2019-08-31 NOTE — Telephone Encounter (Signed)
Called and spoke with patient's wife to let her know that patient is to hold Plavix for 5 days before procedure. Nothing further needed at this time

## 2019-08-31 NOTE — Telephone Encounter (Signed)
Called pt no answer. Mychart message sent.  

## 2019-09-04 NOTE — Telephone Encounter (Signed)
I do not see anything in the pt's OV note about having a procedure that would require him to stop his Plavix. I attempted to contact the pt, there was no answer and no VM. Will try back.

## 2019-09-05 NOTE — Telephone Encounter (Signed)
ATC pt, there was no answer and no option to leave a message. Will try back. 

## 2019-09-06 NOTE — Telephone Encounter (Signed)
ATC pt, there was no answer and no option to leave a message. We have attempted to contact pt several times with no success or call back from pt. Per triage protocol, message will be closed.  

## 2019-09-07 ENCOUNTER — Other Ambulatory Visit: Payer: Self-pay | Admitting: Student

## 2019-09-10 ENCOUNTER — Encounter (HOSPITAL_COMMUNITY): Payer: Self-pay

## 2019-09-10 ENCOUNTER — Other Ambulatory Visit: Payer: Self-pay

## 2019-09-10 ENCOUNTER — Ambulatory Visit (HOSPITAL_COMMUNITY)
Admission: RE | Admit: 2019-09-10 | Discharge: 2019-09-10 | Disposition: A | Discharge status: Home / Self Care | Payer: Medicare Other | Source: Ambulatory Visit | Attending: Critical Care Medicine | Admitting: Critical Care Medicine

## 2019-09-10 DIAGNOSIS — D117 Benign neoplasm of other major salivary glands: Secondary | ICD-10-CM | POA: Insufficient documentation

## 2019-09-10 DIAGNOSIS — R590 Localized enlarged lymph nodes: Secondary | ICD-10-CM

## 2019-09-10 DIAGNOSIS — F17210 Nicotine dependence, cigarettes, uncomplicated: Secondary | ICD-10-CM | POA: Diagnosis not present

## 2019-09-10 DIAGNOSIS — R911 Solitary pulmonary nodule: Secondary | ICD-10-CM | POA: Diagnosis not present

## 2019-09-10 DIAGNOSIS — J479 Bronchiectasis, uncomplicated: Secondary | ICD-10-CM | POA: Diagnosis not present

## 2019-09-10 DIAGNOSIS — D3500 Benign neoplasm of unspecified adrenal gland: Secondary | ICD-10-CM | POA: Diagnosis not present

## 2019-09-10 DIAGNOSIS — D360 Benign neoplasm of lymph nodes: Secondary | ICD-10-CM | POA: Diagnosis not present

## 2019-09-10 LAB — CBC WITH DIFFERENTIAL/PLATELET
Abs Immature Granulocytes: 0.02 10*3/uL (ref 0.00–0.07)
Basophils Absolute: 0.1 10*3/uL (ref 0.0–0.1)
Basophils Relative: 1 %
Eosinophils Absolute: 0.2 10*3/uL (ref 0.0–0.5)
Eosinophils Relative: 2 %
HCT: 36.3 % — ABNORMAL LOW (ref 39.0–52.0)
Hemoglobin: 11.7 g/dL — ABNORMAL LOW (ref 13.0–17.0)
Immature Granulocytes: 0 %
Lymphocytes Relative: 15 %
Lymphs Abs: 1.2 10*3/uL (ref 0.7–4.0)
MCH: 29.3 pg (ref 26.0–34.0)
MCHC: 32.2 g/dL (ref 30.0–36.0)
MCV: 90.8 fL (ref 80.0–100.0)
Monocytes Absolute: 0.6 10*3/uL (ref 0.1–1.0)
Monocytes Relative: 8 %
Neutro Abs: 5.8 10*3/uL (ref 1.7–7.7)
Neutrophils Relative %: 74 %
Platelets: 234 10*3/uL (ref 150–400)
RBC: 4 MIL/uL — ABNORMAL LOW (ref 4.22–5.81)
RDW: 14.6 % (ref 11.5–15.5)
WBC: 7.9 10*3/uL (ref 4.0–10.5)
nRBC: 0 % (ref 0.0–0.2)

## 2019-09-10 LAB — PROTIME-INR
INR: 1 (ref 0.8–1.2)
Prothrombin Time: 12.9 seconds (ref 11.4–15.2)

## 2019-09-10 MED ORDER — FENTANYL CITRATE (PF) 100 MCG/2ML IJ SOLN
INTRAMUSCULAR | Status: AC
  Filled 2019-09-10: qty 2

## 2019-09-10 MED ORDER — MIDAZOLAM HCL 2 MG/2ML IJ SOLN
INTRAMUSCULAR | Status: AC
  Filled 2019-09-10: qty 2

## 2019-09-10 MED ORDER — FENTANYL CITRATE (PF) 100 MCG/2ML IJ SOLN
INTRAMUSCULAR | Status: DC | PRN
  Administered 2019-09-10 (×2): 50 ug via INTRAVENOUS

## 2019-09-10 MED ORDER — SODIUM CHLORIDE 0.9 % IV SOLN: INTRAVENOUS | Status: DC

## 2019-09-10 MED ORDER — MIDAZOLAM HCL 2 MG/2ML IJ SOLN
INTRAMUSCULAR | Status: DC | PRN
  Administered 2019-09-10 (×2): 1 mg via INTRAVENOUS

## 2019-09-10 MED ORDER — LIDOCAINE HCL 1 % IJ SOLN
INTRAMUSCULAR | Status: AC
  Filled 2019-09-10: qty 20

## 2019-09-10 NOTE — Discharge Instructions (Signed)
Needle Biopsy, Care After These instructions tell you how to care for yourself after your procedure. Your doctor may also give you more specific instructions. Call your doctor if you have any problems or questions. What can I expect after the procedure? After the procedure, it is common to have:  Soreness.  Bruising.  Mild pain. Follow these instructions at home:   Return to your normal activities as told by your doctor. Ask your doctor what activities are safe for you.  Take over-the-counter and prescription medicines only as told by your doctor.  Wash your hands with soap and water before you change your bandage (dressing). If you cannot use soap and water, use hand sanitizer.  Follow instructions from your doctor about: ? How to take care of your puncture site. ? When and how to change your bandage. ? When to remove your bandage.  Check your puncture site every day for signs of infection. Watch for: ? Redness, swelling, or pain. ? Fluid or blood. ? Pus or a bad smell. ? Warmth.  Do not take baths, swim, or use a hot tub until your doctor approves. Ask your doctor if you may take showers. You may only be allowed to take sponge baths.  Keep all follow-up visits as told by your doctor. This is important. Contact a doctor if you have:  A fever.  Redness, swelling, or pain at the puncture site, and it lasts longer than a few days.  Fluid, blood, or pus coming from the puncture site.  Warmth coming from the puncture site. Get help right away if:  You have a lot of bleeding from the puncture site. Summary  After the procedure, it is common to have soreness, bruising, or mild pain at the puncture site.  Check your puncture site every day for signs of infection, such as redness, swelling, or pain.  Get help right away if you have severe bleeding from your puncture site. This information is not intended to replace advice given to you by your health care provider. Make  sure you discuss any questions you have with your health care provider.   You have more redness, swelling, or pain around your incision.  You have fluid or blood coming from your incision.  Your incision feels warm to the touch.  You have pus or a bad smell coming from your incision.  You have a fever.  You have pain or numbness that gets worse or lasts longer than a few days. Summary  After a lymph node biopsy, it is common to have bruising, soreness, and mild swelling.  Follow your health care provider's instructions about taking care of yourself at home. You will be told how to take medicines, take care of your incision, and check for infection.  Return to your normal activities as told by your health care provider. Ask your health care provider what activities are safe for you.  Contact a health care provider if you have more redness, swelling, or pain around your incision, you have a fever, or you have worsening pain or numbness. This information is not intended to replace advice given to you by your health care provider. Make sure you discuss any questions you have with your health care provider. Document Revised: 10/06/2017 Document Reviewed: 10/06/2017 Elsevier Patient Education  [place].   Moderate Conscious Sedation, Adult, Care After These instructions provide you with information about caring for yourself after your procedure. Your health care provider may also give you more specific instructions.  Your treatment has been planned according to current medical practices, but problems sometimes occur. Call your health care provider if you have any problems or questions after your procedure. What can I expect after the procedure? After your procedure, it is common:  To feel sleepy for several hours.  To feel clumsy and have poor balance for several hours.  To have poor judgment for several hours.  To vomit if you eat too soon. Follow these instructions at  home: For at least 24 hours after the procedure:   Do not: ? Participate in activities where you could fall or become injured. ? Drive. ? Use heavy machinery. ? Drink alcohol. ? Take sleeping pills or medicines that cause drowsiness. ? Make important decisions or sign legal documents. ? Take care of children on your own.  Rest. Eating and drinking  Follow the diet recommended by your health care provider.  If you vomit: ? Drink water, juice, or soup when you can drink without vomiting. ? Make sure you have little or no nausea before eating solid foods. General instructions  Have a responsible adult stay with you until you are awake and alert.  Take over-the-counter and prescription medicines only as told by your health care provider.  If you smoke, do not smoke without supervision.  Keep all follow-up visits as told by your health care provider. This is important. Contact a health care provider if:  You keep feeling nauseous or you keep vomiting.  You feel light-headed.  You develop a rash.  You have a fever. Get help right away if:  You have trouble breathing. This information is not intended to replace advice given to you by your health care provider. Make sure you discuss any questions you have with your health care provider. Document Revised: 02/11/2017 Document Reviewed: 06/21/2015 Elsevier Patient Education  2020 [employer].

## 2019-09-10 NOTE — Progress Notes (Signed)
1345 On arrival back to [facility] from IR procedure- biopsy site on right of neck with swelling/ quarter size.  ? Bleeding site.   IR RN visualized and decided to reinforce bandaid with pressure dressing and monitor site. 1445 assessed site q15 min.   No change at site/ No further bleeding/hematoma noted.

## 2019-09-10 NOTE — Procedures (Signed)
Interventional Radiology Procedure:   Indications: Cervical lymphadenopathy  Procedure: US guided core biopsy of right cervical lymph node  Findings: Prominent submandibular nodes bilaterally.  Cores obtained from right cervical lymph node.  Complications: none     EBL: less than 10 ml  Plan: Discharge to home in 1 hour   Espyn Radwan R. Anselm Pancoast, MD  Pager: [phone]

## 2019-09-10 NOTE — Consult Note (Signed)
Chief Complaint: Patient was seen in consultation today for image guided cervical lymph node biopsy  Referring Physician(s): Clark,Laura P  Supervising Physician: Markus Daft  Patient Status: [facility] - Out-pt  History of Present Illness: Erik Bechtol. is a 79 y.o. male smoker with recent screening chest CT revealing: 1. Lung-RADS 3, probably benign findings. Dominant 6.9 mm apical right upper lobe solid pulmonary nodule. Short-term follow-up in 6 months is recommended with repeat low-dose chest CT without contrast (please use the following order, "CT CHEST LCS NODULE FOLLOW-UP W/O CM"). 2. 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 3. Three-vessel coronary atherosclerosis. 4. Stable right adrenal adenoma. 5. Aortic Atherosclerosis (ICD10-I70.0) and Emphysema   He also has some palpable cervical adenopathy with recent ultrasound revealing abnormal bilateral cervical adenopathy concerning for lymphoproliferative/ malignant process.  He presents today for image guided cervical lymph node biopsy for further evaluation.  Additional medical history as below.  Past Medical History:  Diagnosis Date  . Adenomatous colon polyp   . Aortic aneurysm ([place])   . BPH (benign prostatic hypertrophy)   . CAD (coronary artery disease)   . Carotid artery stenosis    a. Bilateral CEA  . Cataract   . CHF (congestive heart failure) ([place])   . Chronic systolic heart failure (HCC)    a. EF 20-25%, mild LVH, mod HK, mid apicalanteroseptal myocardium, mild MR, LA mod dilated  . Collagen vascular disease ([place])   . COPD (chronic obstructive pulmonary disease) ([place])   . Coronary artery disease    a. LHC (08/2013): Lmain: short 30% distal, LAD: sml D1 & D2, 70% ostial D1, 95-99% LAD stenosis prox D2 LCx: sml/mod ramus subtot. occluded, 40% ostial set off lg OM1, 40% AV LCx after OM1, RCA:  90% prox (DES to RCA and prox LAD)  . Diverticulosis   . GERD (gastroesophageal reflux disease)   . Heart murmur   . History of colonic polyps   . Hyperlipidemia   . Hyperplastic colon polyp   . Hypertension   . Ischemic cardiomyopathy   . RLS (restless legs syndrome)     Past Surgical History:  Procedure Laterality Date  . cardiac stents  09-2013  . CAROTID ENDARTERECTOMY  04/17/2008   right  . CAROTID ENDARTERECTOMY  05/30/08   Left  . CATARACT EXTRACTION W/PHACO Right 10/05/2018   Procedure: CATARACT EXTRACTION PHACO AND INTRAOCULAR LENS PLACEMENT ([place]), RIGHT;  Surgeon: Birder Robson, MD;  Location: [facility] ORS;  Service: Ophthalmology;  Laterality: Right;  [place] 01:06.4 cde 12.79 Fluid Pack Lot # [id number]  . CATARACT EXTRACTION W/PHACO Left 11/02/2018   Procedure: CATARACT EXTRACTION PHACO AND INTRAOCULAR LENS PLACEMENT ([place]), LEFT;  Surgeon: Birder Robson, MD;  Location: [facility] ORS;  Service: Ophthalmology;  Laterality: Left;  [place]  01:10 CDE 11.52 Fluid pack lot # [id number]  . CHOLECYSTECTOMY     Gall Bladder  . CORONARY ANGIOGRAM  09/11/2013   Procedure: CORONARY ANGIOGRAM;  Surgeon: Troy Sine, MD;  Location: [facility] CATH LAB;  Service: Cardiovascular;;  . CORONARY ANGIOPLASTY     STENTS  . HERNIA REPAIR    . lower aorta aneurysm  05/26/2015   [facility]  . PERCUTANEOUS STENT INTERVENTION  09/11/2013   Procedure: PERCUTANEOUS STENT INTERVENTION;  Surgeon: Troy Sine, MD;  Location: [facility] CATH LAB;  Service: Cardiovascular;;  DES Prox RCA 3.5x15 xience     Allergies: Codeine  Medications: Prior to Admission medications  Medication Sig Start Date End Date Taking? Authorizing Provider  acetaminophen (TYLENOL) 325 MG tablet Take 650 mg by mouth every 6 (six) hours as needed (for pain.).    Yes [provider]  albuterol (VENTOLIN HFA) 108 (90 Base) MCG/ACT inhaler Inhale 2 puffs into the lungs every 4 (four) hours as needed for wheezing or shortness of breath. 08/22/19   Yes Julian Hy, DO  aspirin EC 81 MG tablet Take 81 mg by mouth every evening.    Yes [provider]  ezetimibe-simvastatin (VYTORIN) 10-40 MG tablet TAKE 1 TABLET BY MOUTH AT BEDTIME 07/30/19  Yes Bedsole, Amy E, MD  famotidine (PEPCID) 20 MG tablet Take 1 tablet (20 mg total) by mouth at bedtime. 01/30/19  Yes Pyrtle, Lajuan Lines, MD  ferrous sulfate 325 (65 FE) MG tablet TAKE 1 TABLET BY MOUTH EVERY DAY WITH BREAKFAST 05/01/19  Yes Pyrtle, Lajuan Lines, MD  furosemide (LASIX) 20 MG tablet TAKE 1 TABLET BY MOUTH EVERY DAY 12/27/18  Yes Larey Dresser, MD  lisinopril (ZESTRIL) 5 MG tablet TAKE 1 TABLET BY MOUTH EVERY DAY 05/14/19  Yes Larey Dresser, MD  metoprolol succinate (TOPROL-XL) 50 MG 24 hr tablet TAKE 1 TABLET (50 MG TOTAL) BY MOUTH DAILY. TAKE WITH OR IMMEDIATELY FOLLOWING A MEAL. 09/21/18  Yes Larey Dresser, MD  omega-3 acid ethyl esters (LOVAZA) 1 g capsule Take 1,000 mg by mouth at bedtime. Reported on 06/05/2015   Yes [provider]  pantoprazole (PROTONIX) 40 MG tablet Take 1 tablet (40 mg total) by mouth daily. 04/06/19  Yes Pyrtle, Lajuan Lines, MD  rOPINIRole (REQUIP) 2 MG tablet Take 1 tablet (2 mg total) by mouth 2 (two) times daily. 05/31/19  Yes Bedsole, Amy E, MD  tamsulosin (FLOMAX) 0.4 MG CAPS capsule TAKE 1 CAPSULE BY MOUTH EVERY DAY 06/05/19  Yes Bedsole, Amy E, MD  Tiotropium Bromide-Olodaterol (STIOLTO RESPIMAT) 2.5-2.5 MCG/ACT AERS Inhale 2 puffs into the lungs daily. 08/22/19  Yes Julian Hy, DO  varenicline (CHANTIX CONTINUING MONTH PAK) 1 MG tablet Take 1 tablet (1 mg total) by mouth 2 (two) times daily. 08/22/19  Yes Julian Hy, DO  clopidogrel (PLAVIX) 75 MG tablet Take 1 tablet (75 mg total) by mouth daily. Needs appt for further refills 07/30/19   Larey Dresser, MD  nitroGLYCERIN (NITROSTAT) 0.4 MG SL tablet Place 1 tablet (0.4 mg total) under the tongue every 5 (five) minutes x 3 doses as needed for chest pain. 02/12/16   Bedsole, Amy E, MD  varenicline  (CHANTIX PAK) 0.5 MG X 11 & 1 MG X 42 tablet Take one 0.5 mg tablet by mouth once daily for 3 days, then increase to one 0.5 mg tablet twice daily for 4 days, then increase to one 1 mg tablet twice daily. 08/22/19   Julian Hy, DO     Family History  Problem Relation Age of Onset  . Heart disease Brother   . Hyperlipidemia Brother   . Pancreatic cancer Brother   . Hypertension Mother   . Cancer Father        unknown type; sounds GI  . Emphysema Father   . Hyperlipidemia Brother   . Hyperlipidemia Brother   . Heart attack Brother   . Hyperlipidemia Brother   . Multiple sclerosis Brother   . Kidney disease Neg Hx   . Prostate cancer Neg Hx   . Colon cancer Neg Hx   . Esophageal cancer Neg Hx   .  Rectal cancer Neg Hx   . Stomach cancer Neg Hx     Social History   Socioeconomic History  . Marital status: Married    Spouse name: Not on file  . Number of children: Not on file  . Years of education: Not on file  . Highest education level: Not on file  Occupational History  . Occupation: Retired Development worker, community  Tobacco Use  . Smoking status: Current Every Day Smoker    Packs/day: 1.00    Years: 50.00    Pack years: 50.00    Types: Cigarettes    Last attempt to quit: 08/27/2013    Years since quitting: 6.0  . Smokeless tobacco: Former Systems developer    Quit date: 09/10/2013  . Tobacco comment: off and on since age 64  Vaping Use  . Vaping Use: Never used  Substance and Sexual Activity  . Alcohol use: No  . Drug use: No  . Sexual activity: Yes  Other Topics Concern  . Not on file  Social History Narrative   Lives with wife in [place]. Retired from the post office   Social Determinants of Radio broadcast assistant Strain: Low Risk   . Difficulty of Paying Living Expenses: Not hard at all  Food Insecurity: No Food Insecurity  . Worried About Charity fundraiser in the Last Year: Never true  . Ran Out of Food in the Last Year: Never true  Transportation Needs: No Transportation  Needs  . Lack of Transportation (Medical): No  . Lack of Transportation (Non-Medical): No  Physical Activity: Inactive  . Days of Exercise per Week: 0 days  . Minutes of Exercise per Session: 0 min  Stress: No Stress Concern Present  . Feeling of Stress : Not at all  Social Connections:   . Frequency of Communication with Friends and Family:   . Frequency of Social Gatherings with Friends and Family:   . Attends Religious Services:   . Active Member of Clubs or Organizations:   . Attends Archivist Meetings:   Marland Kitchen Marital Status:       Review of Systems denies fever, headache, chest pain, worsening dyspnea, cough, abdominal/back pain, nausea, vomiting or bleeding.  Vital Signs: BP (!) 152/63   Pulse (!) 55   Temp 98.2 F (36.8 C) (Oral)   Resp 18   Ht 5\' 6"  (1.676 m)   Wt 122 lb (55.3 kg)   SpO2 99%   BMI 19.69 kg/m   Physical Exam awake, alert.  Chest with distant breath sounds bilaterally.  Heart with slightly bradycardic but regular rhythm, positive murmur.  Abdomen soft, positive bowel sounds, nontender.  No lower extremity edema.  Bilateral cervical adenopathy noted.  Imaging: US SOFT TISSUE HEAD & NECK (NON-THYROID)  Result Date: 08/29/2019 CLINICAL DATA:  Palpable cervical adenopathy EXAM: ULTRASOUND OF HEAD/NECK SOFT TISSUES TECHNIQUE: Ultrasound examination of the head and neck soft tissues was performed in the area of clinical concern. COMPARISON:  07/18/2019 chest CT FINDINGS: Ultrasound performed of the palpable lymph nodes bilaterally. These areas correlate with abnormal enlarged lymph nodes on both sides. Largest right cervical lymph node measures 1.4 x 1.0 x 1.2 cm. Largest left cervical lymph node measures 2.0 x 1.3 x 1.2 cm. These lymph nodes have thickened hypoechoic cortex and some have loss of fatty hila. Findings concerning for a lymphoproliferative or malignant process. Lymph nodes would be amenable to ultrasound core biopsy. IMPRESSION: Abnormal  bilateral cervical adenopathy. See above comment and recommendation. Also  consider complete imaging of the neck by CT with contrast. Electronically Signed   By: Jerilynn Mages.  Shick M.D.   On: 08/29/2019 12:53    Labs:  CBC: Recent Labs    12/05/18 1058 05/01/19 1059  WBC 7.1 11.9*  HGB 12.8* 13.1  HCT 38.6* 40.0  PLT 271.0 280.0    COAGS: No results for input(s): INR, APTT in the last 8760 hours.  BMP: Recent Labs    05/31/19 0909  NA 139  K 4.1  CL 104  CO2 31  GLUCOSE 101*  BUN 18  CALCIUM 8.8  CREATININE 0.98    LIVER FUNCTION TESTS: Recent Labs    05/31/19 0909  BILITOT 0.4  AST 11  ALT 8  ALKPHOS 69  PROT 6.7  ALBUMIN 3.7    TUMOR MARKERS: No results for input(s): AFPTM, CEA, CA199, CHROMGRNA in the last 8760 hours.  Assessment and Plan: 79 y.o. male smoker with recent screening chest CT revealing: 1. Lung-RADS 3, probably benign findings. Dominant 6.9 mm apical right upper lobe solid pulmonary nodule. Short-term follow-up in 6 months is recommended with repeat low-dose chest CT without contrast (please use the following order, "CT CHEST LCS NODULE FOLLOW-UP W/O CM"). 2. 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 3. Three-vessel coronary atherosclerosis. 4. Stable right adrenal adenoma. 5. Aortic Atherosclerosis (ICD10-I70.0) and Emphysema   He also has some palpable cervical adenopathy with recent ultrasound revealing abnormal bilateral cervical adenopathy concerning for lymphoproliferative/ malignant process.  He presents today for image guided cervical lymph node biopsy for further evaluation. Risks and benefits of procedure was discussed with the patient  including, but not limited to bleeding, infection, damage to adjacent structures or low yield requiring additional tests.  All of the questions were answered and there is agreement to  proceed.  Consent signed and in chart.     Thank you for this interesting consult.  I greatly enjoyed meeting [employer]. and look forward to participating in their care.  A copy of this report was sent to the requesting provider on this date.  Electronically Signed: D. Rowe Robert, PA-C 09/10/2019, 12:03 PM   I spent a total of 20 minutes    in face to face in clinical consultation, greater than 50% of which was counseling/coordinating care for image guided cervical lymph node biopsy

## 2019-09-11 LAB — SURGICAL PATHOLOGY

## 2019-09-11 NOTE — Progress Notes (Signed)
Please let [name] know that his biopsy showed a reactive salivary gland caused by smoking. Most of the time this is benign. No additional biopsies or follow up needed. I recommend smoking cessation and am happy to discuss it with him more.  LPC

## 2019-09-13 ENCOUNTER — Other Ambulatory Visit (HOSPITAL_COMMUNITY): Payer: Self-pay | Admitting: Cardiology

## 2019-09-16 ENCOUNTER — Other Ambulatory Visit: Payer: Self-pay | Admitting: Internal Medicine

## 2019-09-19 DIAGNOSIS — H02832 Dermatochalasis of right lower eyelid: Secondary | ICD-10-CM | POA: Diagnosis not present

## 2019-09-19 DIAGNOSIS — H02102 Unspecified ectropion of right lower eyelid: Secondary | ICD-10-CM | POA: Diagnosis not present

## 2019-09-19 DIAGNOSIS — H02835 Dermatochalasis of left lower eyelid: Secondary | ICD-10-CM | POA: Diagnosis not present

## 2019-09-19 DIAGNOSIS — H02132 Senile ectropion of right lower eyelid: Secondary | ICD-10-CM | POA: Diagnosis not present

## 2019-09-19 DIAGNOSIS — J449 Chronic obstructive pulmonary disease, unspecified: Secondary | ICD-10-CM | POA: Diagnosis not present

## 2019-09-19 DIAGNOSIS — H02135 Senile ectropion of left lower eyelid: Secondary | ICD-10-CM | POA: Diagnosis not present

## 2019-09-19 DIAGNOSIS — H02831 Dermatochalasis of right upper eyelid: Secondary | ICD-10-CM | POA: Diagnosis not present

## 2019-09-19 DIAGNOSIS — H02105 Unspecified ectropion of left lower eyelid: Secondary | ICD-10-CM | POA: Diagnosis not present

## 2019-09-19 DIAGNOSIS — F17210 Nicotine dependence, cigarettes, uncomplicated: Secondary | ICD-10-CM | POA: Diagnosis not present

## 2019-09-19 DIAGNOSIS — H02834 Dermatochalasis of left upper eyelid: Secondary | ICD-10-CM | POA: Diagnosis not present

## 2019-09-19 DIAGNOSIS — Z79899 Other long term (current) drug therapy: Secondary | ICD-10-CM | POA: Diagnosis not present

## 2019-09-19 DIAGNOSIS — Z7902 Long term (current) use of antithrombotics/antiplatelets: Secondary | ICD-10-CM | POA: Diagnosis not present

## 2019-09-27 ENCOUNTER — Other Ambulatory Visit: Payer: Self-pay | Admitting: *Deleted

## 2019-09-27 DIAGNOSIS — Z87891 Personal history of nicotine dependence: Secondary | ICD-10-CM

## 2019-09-27 DIAGNOSIS — F17210 Nicotine dependence, cigarettes, uncomplicated: Secondary | ICD-10-CM

## 2019-10-10 ENCOUNTER — Telehealth: Payer: Self-pay | Admitting: Critical Care Medicine

## 2019-10-10 DIAGNOSIS — A319 Mycobacterial infection, unspecified: Secondary | ICD-10-CM

## 2019-10-10 NOTE — Telephone Encounter (Signed)
Received call report on sputum  + AFB at 4 wks, ID to follow Will forward to Dr Carlis Abbott as well as APP of the day since Dr Carlis Abbott out of the office

## 2019-10-10 NOTE — Telephone Encounter (Signed)
Referral has been placed to ID.  Pt is aware and voiced his understanding.  Nothing further is needed.

## 2019-10-10 NOTE — Telephone Encounter (Signed)
Just make sure patient has been referred to ID and has an apt with them

## 2019-10-12 ENCOUNTER — Telehealth: Payer: Self-pay | Admitting: Critical Care Medicine

## 2019-10-12 ENCOUNTER — Other Ambulatory Visit: Payer: Self-pay | Admitting: *Deleted

## 2019-10-12 NOTE — Telephone Encounter (Signed)
Received a fax from [facility] regarding Chantix.  Spoke with a pharmacist at [facility] and she stated that chantix is on back order and is being recalled and is likely going to be pulled from the market.  Physicians are asked to prescribed an alternate medication such as Zyban.  Just making Dr. Carlis Abbott aware since this patient is on this medication currently.

## 2019-10-15 ENCOUNTER — Telehealth: Payer: Self-pay | Admitting: Critical Care Medicine

## 2019-10-15 NOTE — Telephone Encounter (Signed)
Please verify that he has never had seizures in the past. It is not listed in his medical history. If he has not, then please prescribe Zyban 150mg  daily x 3 days, then 150mg  BID. Administer #[id number], refills #3.  Julian Hy, DO 10/15/19 3:51 PM [facility]

## 2019-10-15 NOTE — Telephone Encounter (Signed)
ATC patient, unable to reach. Left message to call back.

## 2019-10-15 NOTE — Telephone Encounter (Signed)
Please have him come in to pick up another sample cup- we need 2 samples for expectorated sputum to confirm this diagnosis. I will call [facility] to get the sensitivities performed.  Julian Hy, DO 10/15/19 5:04 PM [facility]

## 2019-10-15 NOTE — Telephone Encounter (Signed)
Patient was prescribed Chantix which has now been recalled. Need an alternative treatment. Please advise.

## 2019-10-16 MED ORDER — BUPROPION HCL ER (SMOKING DET) 150 MG PO TB12
150.0000 mg | ORAL_TABLET | Freq: Two times a day (BID) | ORAL | 3 refills | Status: DC
Start: 2019-10-16 — End: 2020-06-10

## 2019-10-16 NOTE — Telephone Encounter (Signed)
Patient contacted, he does not have any history of seizures. Prescription for Zyban sent to preferred pharmacy.

## 2019-10-16 NOTE — Addendum Note (Signed)
Addended by: Lorretta Harp on: 10/16/2019 01:11 PM   Modules accepted: Orders

## 2019-10-16 NOTE — Telephone Encounter (Signed)
Order placed for the AFB and pt has come by office to pick up specimen cups and instructions. Nothing further needed.

## 2019-10-16 NOTE — Addendum Note (Signed)
Addended by: Tery Sanfilippo R on: 10/16/2019 10:07 AM   Modules accepted: Orders

## 2019-10-16 NOTE — Telephone Encounter (Signed)
Called and spoke with pt letting him know that Dr. Carlis Abbott was needing him to come by office to pick up cup to do another sputum sample as we need two samples to confirm diagnosis. Pt verbalized understanding.  Specimen cup has been placed up front for pt.  Dr. Carlis Abbott, please advise if we are only repeating the AFB

## 2019-10-16 NOTE — Telephone Encounter (Signed)
Yes, that is correct. Only the AFB sample needs to be repeated. Thanks!

## 2019-10-17 ENCOUNTER — Other Ambulatory Visit: Payer: Medicare Other

## 2019-10-17 DIAGNOSIS — A319 Mycobacterial infection, unspecified: Secondary | ICD-10-CM

## 2019-10-21 ENCOUNTER — Other Ambulatory Visit (HOSPITAL_COMMUNITY): Payer: Self-pay | Admitting: Cardiology

## 2019-10-22 ENCOUNTER — Other Ambulatory Visit: Payer: Self-pay

## 2019-10-22 ENCOUNTER — Ambulatory Visit (INDEPENDENT_AMBULATORY_CARE_PROVIDER_SITE_OTHER): Payer: Medicare Other | Admitting: Critical Care Medicine

## 2019-10-22 ENCOUNTER — Telehealth: Payer: Self-pay | Admitting: *Deleted

## 2019-10-22 DIAGNOSIS — D509 Iron deficiency anemia, unspecified: Secondary | ICD-10-CM

## 2019-10-22 DIAGNOSIS — R918 Other nonspecific abnormal finding of lung field: Secondary | ICD-10-CM

## 2019-10-22 DIAGNOSIS — Z720 Tobacco use: Secondary | ICD-10-CM

## 2019-10-22 LAB — PULMONARY FUNCTION TEST
DL/VA % pred: 101 %
DL/VA: 4.02 ml/min/mmHg/L
DLCO cor % pred: 86 %
DLCO cor: 19.66 ml/min/mmHg
DLCO unc % pred: 86 %
DLCO unc: 19.66 ml/min/mmHg
FEF 25-75 Post: 1.74 L/sec
FEF 25-75 Pre: 0.91 L/sec
FEF2575-%Change-Post: 90 %
FEF2575-%Pred-Post: 94 %
FEF2575-%Pred-Pre: 49 %
FEV1-%Change-Post: 26 %
FEV1-%Pred-Post: 86 %
FEV1-%Pred-Pre: 68 %
FEV1-Post: 2.29 L
FEV1-Pre: 1.81 L
FEV1FVC-%Change-Post: 17 %
FEV1FVC-%Pred-Pre: 80 %
FEV6-%Change-Post: 8 %
FEV6-%Pred-Post: 97 %
FEV6-%Pred-Pre: 90 %
FEV6-Post: 3.35 L
FEV6-Pre: 3.1 L
FEV6FVC-%Change-Post: 0 %
FEV6FVC-%Pred-Post: 107 %
FEV6FVC-%Pred-Pre: 106 %
FVC-%Change-Post: 7 %
FVC-%Pred-Post: 90 %
FVC-%Pred-Pre: 84 %
FVC-Post: 3.37 L
FVC-Pre: 3.14 L
Post FEV1/FVC ratio: 68 %
Post FEV6/FVC ratio: 100 %
Pre FEV1/FVC ratio: 58 %
Pre FEV6/FVC Ratio: 99 %

## 2019-10-22 NOTE — Telephone Encounter (Signed)
-----   Message from Larina Bras, [place] sent at 05/01/2019  4:53 PM EST ----- See lab notes from 05/01/19. Pt needs repeat ibc/ferritin around 10/29/19. Order in epic and call to remind pt

## 2019-10-22 NOTE — Progress Notes (Signed)
Full PFT performed today. °

## 2019-10-22 NOTE — Telephone Encounter (Signed)
Left message reminding patient to have labs completed week of 10/29/19

## 2019-10-24 ENCOUNTER — Encounter: Payer: Self-pay | Admitting: Internal Medicine

## 2019-10-24 ENCOUNTER — Ambulatory Visit (INDEPENDENT_AMBULATORY_CARE_PROVIDER_SITE_OTHER): Payer: Medicare Other | Admitting: Internal Medicine

## 2019-10-24 ENCOUNTER — Other Ambulatory Visit: Payer: Self-pay

## 2019-10-24 DIAGNOSIS — I255 Ischemic cardiomyopathy: Secondary | ICD-10-CM | POA: Diagnosis not present

## 2019-10-24 DIAGNOSIS — J479 Bronchiectasis, uncomplicated: Secondary | ICD-10-CM | POA: Diagnosis not present

## 2019-10-24 NOTE — Progress Notes (Addendum)
[facility] for Infectious Disease      Reason for Consult: bronchiectasis    Referring Physician: Dr. Carlis Abbott    Patient ID: Erik [name]., male    DOB: 1940/07/30, 79 y.o.   MRN: [id number]  HPI:   Here for evaluation of bronchiectasis noted on CT for lung cancer screening.  He is a smoker of 55 years and continues to smoke, though trying to quit.  His CT scan was concerning for tree-in-bud opacities and had AFB sputums sent off but no result to date.  There is a message about a positive AFB at 4 weeks but no results available to me.  He has had no progressive sob, no worsening cough.  He only coughs with smoking.  No fever, no night sweats.  Here with his wife.  CT findings felt to be more c/w bronchitis by Dr. Carlis Abbott.  He has lost weight with associated diarrhea, about 20 lbs this year and has been evaluated by Dr. Hilarie Fredrickson but no etiology found.    Past Medical History:  Diagnosis Date  . Adenomatous colon polyp   . Aortic aneurysm ([place])   . BPH (benign prostatic hypertrophy)   . CAD (coronary artery disease)   . Carotid artery stenosis    a. Bilateral CEA  . Cataract   . CHF (congestive heart failure) ([place])   . Chronic systolic heart failure (HCC)    a. EF 20-25%, mild LVH, mod HK, mid apicalanteroseptal myocardium, mild MR, LA mod dilated  . Collagen vascular disease ([place])   . COPD (chronic obstructive pulmonary disease) ([place])   . Coronary artery disease    a. LHC (08/2013): Lmain: short 30% distal, LAD: sml D1 & D2, 70% ostial D1, 95-99% LAD stenosis prox D2 LCx: sml/mod ramus subtot. occluded, 40% ostial set off lg OM1, 40% AV LCx after OM1, RCA: 90% prox (DES to RCA and prox LAD)  . Diverticulosis   . GERD (gastroesophageal reflux disease)   . Heart murmur   . History of colonic polyps   . Hyperlipidemia   . Hyperplastic colon polyp   . Hypertension   . Ischemic cardiomyopathy   . RLS (restless legs syndrome)     Prior to Admission medications   Medication  Sig Start Date End Date Taking? Authorizing Provider  acetaminophen (TYLENOL) 325 MG tablet Take 650 mg by mouth every 6 (six) hours as needed (for pain.).     [provider]  albuterol (VENTOLIN HFA) 108 (90 Base) MCG/ACT inhaler Inhale 2 puffs into the lungs every 4 (four) hours as needed for wheezing or shortness of breath. 08/22/19   Julian Hy, DO  aspirin EC 81 MG tablet Take 81 mg by mouth every evening.     [provider]  buPROPion (ZYBAN) 150 MG 12 hr tablet Take 1 tablet (150 mg total) by mouth 2 (two) times daily. Take one tablet a day for 3 days then take twice a day. 10/16/19   Julian Hy, DO  clopidogrel (PLAVIX) 75 MG tablet TAKE 1 TABLET (75 MG TOTAL) BY MOUTH DAILY. NEEDS APPT FOR FURTHER REFILLS 10/22/19   Larey Dresser, MD  ezetimibe-simvastatin (VYTORIN) 10-40 MG tablet TAKE 1 TABLET BY MOUTH AT BEDTIME 07/30/19   Bedsole, Amy E, MD  famotidine (PEPCID) 20 MG tablet Take 1 tablet (20 mg total) by mouth at bedtime. 01/30/19   Pyrtle, Lajuan Lines, MD  ferrous sulfate 325 (65 FE) MG tablet TAKE 1 TABLET BY MOUTH  EVERY DAY WITH BREAKFAST 05/01/19   Pyrtle, Lajuan Lines, MD  furosemide (LASIX) 20 MG tablet TAKE 1 TABLET BY MOUTH EVERY DAY 12/27/18   Larey Dresser, MD  lisinopril (ZESTRIL) 5 MG tablet TAKE 1 TABLET BY MOUTH EVERY DAY 05/14/19   Larey Dresser, MD  metoprolol succinate (TOPROL-XL) 50 MG 24 hr tablet TAKE 1 TABLET (50 MG TOTAL) BY MOUTH DAILY. TAKE WITH OR IMMEDIATELY FOLLOWING A MEAL. 09/14/19   Larey Dresser, MD  nitroGLYCERIN (NITROSTAT) 0.4 MG SL tablet Place 1 tablet (0.4 mg total) under the tongue every 5 (five) minutes x 3 doses as needed for chest pain. 02/12/16   Bedsole, Amy E, MD  omega-3 acid ethyl esters (LOVAZA) 1 g capsule Take 1,000 mg by mouth at bedtime. Reported on 06/05/2015    [provider]  pantoprazole (PROTONIX) 40 MG tablet TAKE 1 TABLET BY MOUTH EVERY DAY 09/18/19   Pyrtle, Lajuan Lines, MD  rOPINIRole (REQUIP) 2 MG tablet Take 1  tablet (2 mg total) by mouth 2 (two) times daily. 05/31/19   Bedsole, Amy E, MD  tamsulosin (FLOMAX) 0.4 MG CAPS capsule TAKE 1 CAPSULE BY MOUTH EVERY DAY 06/05/19   Bedsole, Amy E, MD  Tiotropium Bromide-Olodaterol (STIOLTO RESPIMAT) 2.5-2.5 MCG/ACT AERS Inhale 2 puffs into the lungs daily. 08/22/19   Julian Hy, DO    Allergies  Allergen Reactions  . Codeine Swelling    throat swells    Social History   Tobacco Use  . Smoking status: Current Every Day Smoker    Packs/day: 1.00    Years: 50.00    Pack years: 50.00    Types: Cigarettes    Last attempt to quit: 08/27/2013    Years since quitting: 6.1  . Smokeless tobacco: Former Systems developer    Quit date: 09/10/2013  . Tobacco comment: off and on since age 33  Vaping Use  . Vaping Use: Never used  Substance Use Topics  . Alcohol use: No  . Drug use: No    Family History  Problem Relation Age of Onset  . Heart disease Brother   . Hyperlipidemia Brother   . Pancreatic cancer Brother   . Hypertension Mother   . Cancer Father        unknown type; sounds GI  . Emphysema Father   . Hyperlipidemia Brother   . Hyperlipidemia Brother   . Heart attack Brother   . Hyperlipidemia Brother   . Multiple sclerosis Brother   . Kidney disease Neg Hx   . Prostate cancer Neg Hx   . Colon cancer Neg Hx   . Esophageal cancer Neg Hx   . Rectal cancer Neg Hx   . Stomach cancer Neg Hx     Review of Systems  Constitutional: negative for fevers, chills, fatigue and malaise Respiratory: positive for chronic bronchitis, negative for sputum, hemoptysis, pleurisy/chest pain, wheezing or dyspnea on exertion Integument/breast: negative for rash Musculoskeletal: negative for myalgias and arthralgias All other systems reviewed and are negative    Constitutional: in no apparent distress  Vitals:   10/24/19 1459  Pulse: 67  Temp: 98.4 F (36.9 C)  SpO2: 99%   EYES: anicteric ENMT: Cardiovascular: Cor RRR Respiratory: clear, distant, no  wheezes Musculoskeletal: no pedal edema noted Skin: negatives: no rash Neuro: non-focal  Labs: Lab Results  Component Value Date   WBC 7.9 09/10/2019   HGB 11.7 (L) 09/10/2019   HCT 36.3 (L) 09/10/2019   MCV 90.8 09/10/2019   PLT  234 09/10/2019    Lab Results  Component Value Date   CREATININE 0.98 05/31/2019   BUN 18 05/31/2019   NA 139 05/31/2019   K 4.1 05/31/2019   CL 104 05/31/2019   CO2 31 05/31/2019    Lab Results  Component Value Date   ALT 8 05/31/2019   AST 11 05/31/2019   ALKPHOS 69 05/31/2019   BILITOT 0.4 05/31/2019   INR 1.0 09/10/2019     Assessment: Bronchiectasis vs bronchitis and positive AFB sputum.  It appears that he is largely asymptomatic from this with no progressive decline or concerning symptoms at this time.  Also, one sputum positive and unclear if MAI or other Mycobacteria.  He is getting another sputum sample and will see if it is persistently positive and also get a copy of the original result.  No treatment for Mycobacteria indicated at this time and will follow.    Plan: 1) seoond sample for AFB smear and culture 2) will request result of positive sample 3) will follow up in 4 months after CT scan, sooner as indicated for persistent positive sputums or worsening symptomatology.    ADDENDUM 8/16: I received culture results from pulmonary and the AFB smear is negative and culture positive with probe negative for MAI and M tb.  Results are from 08/24/19 sputum.

## 2019-10-25 NOTE — Telephone Encounter (Signed)
Staff message received from Dr. Linus Salmons with ID: Linus Salmons Okey Regal, MD  Raissa Dam, Waldemar Dickens, CMA I saw this mutual patient today for the positive AFB. Can you send me a copy of the result since it is not in Epic. You can fax to [phone]  Thanks    Called [facility] and spoke with Mitzi Hansen to see if we could get ahold of the sputum culture results due to needing to send them to ID for pt. Mitzi Hansen was able to pull up results from 7/28 and is faxing them to our office. Will fax to Dr. Linus Salmons with ID once received.

## 2019-10-25 NOTE — Telephone Encounter (Signed)
Fax received from [facility] and has been faxed to Dr. Linus Salmons with ID. Nothing further needed.

## 2019-10-29 ENCOUNTER — Other Ambulatory Visit (INDEPENDENT_AMBULATORY_CARE_PROVIDER_SITE_OTHER): Payer: Medicare Other

## 2019-10-29 DIAGNOSIS — D509 Iron deficiency anemia, unspecified: Secondary | ICD-10-CM | POA: Diagnosis not present

## 2019-10-29 LAB — IBC + FERRITIN
Ferritin: 44.2 ng/mL (ref 22.0–322.0)
Iron: 88 ug/dL (ref 42–165)
Saturation Ratios: 22.7 % (ref 20.0–50.0)
Transferrin: 277 mg/dL (ref 212.0–360.0)

## 2019-11-09 LAB — AFB ID BY DNA PROBE
M avium complex: NEGATIVE
M tuberculosis complex: NEGATIVE

## 2019-11-09 LAB — AFB CULTURE WITH SMEAR (NOT AT ARMC)
Acid Fast Culture: POSITIVE — AB
Acid Fast Smear: NEGATIVE

## 2019-11-09 LAB — ORGANISM ID BY SEQUENCING

## 2019-11-15 ENCOUNTER — Other Ambulatory Visit (HOSPITAL_COMMUNITY): Payer: Self-pay | Admitting: Cardiology

## 2019-11-23 ENCOUNTER — Other Ambulatory Visit: Payer: Self-pay | Admitting: Family Medicine

## 2019-12-09 ENCOUNTER — Other Ambulatory Visit (HOSPITAL_COMMUNITY): Payer: Self-pay | Admitting: Cardiology

## 2019-12-13 ENCOUNTER — Telehealth: Payer: Self-pay | Admitting: Critical Care Medicine

## 2019-12-13 ENCOUNTER — Other Ambulatory Visit: Payer: Self-pay | Admitting: Critical Care Medicine

## 2019-12-13 LAB — AFB ID BY DNA PROBE
M avium complex: NEGATIVE
M gordonae: NEGATIVE
M kansasii: NEGATIVE
M tuberculosis complex: NEGATIVE

## 2019-12-13 LAB — AFB CULTURE WITH SMEAR (NOT AT ARMC)
Acid Fast Culture: POSITIVE — AB
Acid Fast Smear: NEGATIVE

## 2019-12-13 LAB — ORGANISM ID BY SEQUENCING

## 2019-12-13 NOTE — Telephone Encounter (Signed)
Attempted to call pt to see if we could get him scheduled for a f/u with Dr. Carlis Abbott October 2021 but unable to reach. Left message for him to return call.

## 2019-12-13 NOTE — Telephone Encounter (Signed)
Call received from [facility] with [facility], result from [facility]:  Mycobacterium nebraskense.  Routed to Dr. Carlis Abbott for recommendations.  Dr. Carlis Abbott, please advise.  Thank you.

## 2019-12-13 NOTE — Progress Notes (Addendum)
Call received from [facility] with [facility], result from [employer]:  Mycobacterium nebraskense.  Routed to Dr. Carlis Abbott for recommendations.

## 2019-12-13 NOTE — Telephone Encounter (Signed)
Thanks for the update. I have called to have susceptibilities added. Please make sure he has a follow up appointment in the next 1 month with either me or to establish care with Dr. Silas Flood (30 min visit).  Julian Hy, DO 12/13/19 1:55 PM [facility]

## 2020-01-08 ENCOUNTER — Other Ambulatory Visit (HOSPITAL_COMMUNITY): Payer: Self-pay | Admitting: Cardiology

## 2020-01-15 ENCOUNTER — Telehealth: Payer: Self-pay | Admitting: Acute Care

## 2020-01-15 NOTE — Telephone Encounter (Signed)
Spoke with Wells Guiles and verbalized understanding and will continue with Ct chest w/o contrast. Nothing further needed at this time.

## 2020-01-15 NOTE — Telephone Encounter (Signed)
[place] x 1 for Wells Guiles at [place].  CT Chest w/o contrast was ordered because pt is 57 and past the age for lung cancer screening. I received a red stop when I tried to order the ct as a nodule f/u. Eric Form, NP approved to just order a CT Chest w/o contrast.

## 2020-01-18 ENCOUNTER — Inpatient Hospital Stay: Admission: RE | Admit: 2020-01-18 | Payer: Medicare Other | Source: Ambulatory Visit

## 2020-02-01 ENCOUNTER — Other Ambulatory Visit: Payer: Self-pay | Admitting: Internal Medicine

## 2020-02-02 LAB — SLOW GROWER BROTH SUSCEP.

## 2020-02-02 LAB — SPECIMEN STATUS REPORT

## 2020-02-25 ENCOUNTER — Ambulatory Visit: Payer: Medicare Other | Admitting: Internal Medicine

## 2020-03-11 ENCOUNTER — Other Ambulatory Visit: Payer: Self-pay | Admitting: *Deleted

## 2020-03-11 ENCOUNTER — Other Ambulatory Visit: Payer: Self-pay

## 2020-03-11 DIAGNOSIS — I714 Abdominal aortic aneurysm, without rupture, unspecified: Secondary | ICD-10-CM

## 2020-03-13 ENCOUNTER — Other Ambulatory Visit (HOSPITAL_COMMUNITY): Payer: Self-pay | Admitting: Cardiology

## 2020-03-17 ENCOUNTER — Other Ambulatory Visit: Payer: Self-pay | Admitting: Internal Medicine

## 2020-03-24 ENCOUNTER — Other Ambulatory Visit (HOSPITAL_COMMUNITY): Payer: Self-pay | Admitting: Cardiology

## 2020-03-25 ENCOUNTER — Other Ambulatory Visit: Payer: Self-pay

## 2020-03-25 ENCOUNTER — Ambulatory Visit (INDEPENDENT_AMBULATORY_CARE_PROVIDER_SITE_OTHER): Payer: Medicare Other | Admitting: Physician Assistant

## 2020-03-25 ENCOUNTER — Ambulatory Visit (HOSPITAL_COMMUNITY)
Admission: RE | Admit: 2020-03-25 | Discharge: 2020-03-25 | Disposition: A | Discharge status: Home / Self Care | Payer: Medicare Other | Source: Ambulatory Visit | Attending: Vascular Surgery | Admitting: Vascular Surgery

## 2020-03-25 VITALS — BP 153/66 | HR 71 | Temp 98.3°F | Resp 16 | Ht 66.0 in | Wt 131.2 lb

## 2020-03-25 DIAGNOSIS — I714 Abdominal aortic aneurysm, without rupture, unspecified: Secondary | ICD-10-CM

## 2020-03-25 DIAGNOSIS — I6523 Occlusion and stenosis of bilateral carotid arteries: Secondary | ICD-10-CM

## 2020-03-25 NOTE — Progress Notes (Signed)
Office Note     CC:  follow up Requesting Provider:  [name], MD  HPI: Erik [name]. is a 80 y.o. (09/08/1940) male who presents for routine follow up of carotid disease as well as AAA. He is s/p bilateral carotid endarterectomies done in February and March of 2010 by Dr. Scot Dock. He has no history of TIA or stroke. Today he denies Amaurosis fugax or monocular blindness, facial drooping, slurred speech, upper or lower extremity weakness or numbness.  He has history of  FEVAR with a Z-fen Device in March of 2017 for his AAA by Dr. Dante Gang at [facility]. 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 We have been following his FEVAR since 2020 at the request of the patient.   At time of his last visit, which was a tele visit in 2020, he was having some GI issues. He was seeing a GI specialist for abdominal pain, weight loss and frequent dark stools. He was not having any food fear or post prandial abdominal pain. He had a CT abdomen and pelvis obtained from [facility] that showed some Celiac and SMA stenosis but not >70% and likely not cause of his symptoms. His EVAR was stable on CTA.  Today he says his abdomen is improved. He no longer has any pain. He does say if he bends over to tie his shoes or if he is in a bent over position for a little while when he sits or stands back up straight he gets kind of nauseated and his stomach is uncomfortable. He says is weight has actually gone back to normal. He is still having dark stools. He had upper and lower endoscopy and he reports nothing abnormal was found. He does continue to follow up with GI. He denies post prandial pain or food fear  He denies any lower extremity claudication symptoms, rest pain or non healing wounds. He does describe weakness and tiredness in his legs. He says when he is walking at a leisurely pace he has no trouble but if he tries to exert himself his knees and thighs feel very tired.  He also complains of restlessness in his legs at night. He denies any numbness or swelling  The pt is on a statin for cholesterol management.  The pt is on a daily aspirin.   Other AC:  Plavix The pt is on BB, ACE, and diuretic for hypertension.   The pt is not diabetic.  Tobacco hx:  Current smoker, 1 ppd  Past Medical History:  Diagnosis Date  . Adenomatous colon polyp   . Aortic aneurysm ([place])   . BPH (benign prostatic hypertrophy)   . CAD (coronary artery disease)   . Carotid artery stenosis    a. Bilateral CEA  . Cataract   . CHF (congestive heart failure) ([place])   . Chronic systolic heart failure (HCC)    a. EF 20-25%, mild LVH, mod HK, mid apicalanteroseptal myocardium, mild MR, LA mod dilated  . Collagen vascular disease ([place])   . COPD (chronic obstructive pulmonary disease) ([place])   . Coronary artery disease    a. LHC (08/2013): Lmain: short 30% distal, LAD: sml D1 & D2, 70% ostial D1, 95-99% LAD stenosis prox D2 LCx: sml/mod ramus subtot. occluded, 40% ostial set off lg OM1, 40% AV LCx after OM1, RCA: 90% prox (DES to RCA and prox LAD)  . Diverticulosis   . GERD (gastroesophageal reflux disease)   . Heart murmur   .  History of colonic polyps   . Hyperlipidemia   . Hyperplastic colon polyp   . Hypertension   . Ischemic cardiomyopathy   . RLS (restless legs syndrome)     Past Surgical History:  Procedure Laterality Date  . cardiac stents  09-2013  . CAROTID ENDARTERECTOMY  04/17/2008   right  . CAROTID ENDARTERECTOMY  05/30/08   Left  . CATARACT EXTRACTION W/PHACO Right 10/05/2018   Procedure: CATARACT EXTRACTION PHACO AND INTRAOCULAR LENS PLACEMENT ([place]), RIGHT;  Surgeon: Birder Robson, MD;  Location: [facility] ORS;  Service: Ophthalmology;  Laterality: Right;  [place] 01:06.4 cde 12.79 Fluid Pack Lot # [id number]  . CATARACT EXTRACTION W/PHACO Left 11/02/2018   Procedure: CATARACT EXTRACTION PHACO AND INTRAOCULAR LENS PLACEMENT ([place]), LEFT;  Surgeon: Birder Robson, MD;   Location: [facility] ORS;  Service: Ophthalmology;  Laterality: Left;  [place]  01:10 CDE 11.52 Fluid pack lot # [id number]  . CHOLECYSTECTOMY     Gall Bladder  . CORONARY ANGIOGRAM  09/11/2013   Procedure: CORONARY ANGIOGRAM;  Surgeon: Troy Sine, MD;  Location: [facility] CATH LAB;  Service: Cardiovascular;;  . CORONARY ANGIOPLASTY     STENTS  . HERNIA REPAIR    . lower aorta aneurysm  05/26/2015   [facility]  . PERCUTANEOUS STENT INTERVENTION  09/11/2013   Procedure: PERCUTANEOUS STENT INTERVENTION;  Surgeon: Troy Sine, MD;  Location: [facility] CATH LAB;  Service: Cardiovascular;;  DES Prox RCA 3.5x15 xience     Social History   Socioeconomic History  . Marital status: Married    Spouse name: Not on file  . Number of children: Not on file  . Years of education: Not on file  . Highest education level: Not on file  Occupational History  . Occupation: Retired Development worker, community  Tobacco Use  . Smoking status: Current Every Day Smoker    Packs/day: 1.00    Years: 50.00    Pack years: 50.00    Types: Cigarettes    Last attempt to quit: 08/27/2013    Years since quitting: 6.5  . Smokeless tobacco: Former Systems developer    Quit date: 09/10/2013  . Tobacco comment: off and on since age 84  Vaping Use  . Vaping Use: Never used  Substance and Sexual Activity  . Alcohol use: No  . Drug use: No  . Sexual activity: Yes  Other Topics Concern  . Not on file  Social History Narrative   Lives with wife in [place]. Retired from the post office   Social Determinants of Radio broadcast assistant Strain: Low Risk   . Difficulty of Paying Living Expenses: Not hard at all  Food Insecurity: No Food Insecurity  . Worried About Charity fundraiser in the Last Year: Never true  . Ran Out of Food in the Last Year: Never true  Transportation Needs: No Transportation Needs  . Lack of Transportation (Medical): No  . Lack of Transportation (Non-Medical): No  Physical Activity: Inactive  . Days of Exercise per Week: 0 days  .  Minutes of Exercise per Session: 0 min  Stress: No Stress Concern Present  . Feeling of Stress : Not at all  Social Connections: Not on file  Intimate Partner Violence: Not At Risk  . Fear of Current or Ex-Partner: No  . Emotionally Abused: No  . Physically Abused: No  . Sexually Abused: No    Family History  Problem Relation Age of Onset  . Heart disease Brother   . Hyperlipidemia Brother   .  Pancreatic cancer Brother   . Hypertension Mother   . Cancer Father        unknown type; sounds GI  . Emphysema Father   . Hyperlipidemia Brother   . Hyperlipidemia Brother   . Heart attack Brother   . Hyperlipidemia Brother   . Multiple sclerosis Brother   . Kidney disease Neg Hx   . Prostate cancer Neg Hx   . Colon cancer Neg Hx   . Esophageal cancer Neg Hx   . Rectal cancer Neg Hx   . Stomach cancer Neg Hx     Current Outpatient Medications  Medication Sig Dispense Refill  . acetaminophen (TYLENOL) 325 MG tablet Take 650 mg by mouth every 6 (six) hours as needed (for pain.).     Marland Kitchen aspirin EC 81 MG tablet Take 81 mg by mouth every evening.     . clopidogrel (PLAVIX) 75 MG tablet TAKE 1 TABLET (75 MG TOTAL) BY MOUTH DAILY. NEEDS APPT FOR FURTHER REFILLS 30 tablet 0  . ezetimibe-simvastatin (VYTORIN) 10-40 MG tablet TAKE 1 TABLET BY MOUTH AT BEDTIME 90 tablet 3  . famotidine (PEPCID) 20 MG tablet Take 1 tablet (20 mg total) by mouth at bedtime. 1 tablet 0  . ferrous sulfate 325 (65 FE) MG tablet TAKE 1 TABLET BY MOUTH EVERY DAY WITH BREAKFAST 90 tablet 0  . furosemide (LASIX) 20 MG tablet TAKE 1 TABLET BY MOUTH EVERY DAY.  Needs an appointment for future refills 30 tablet 0  . lisinopril (ZESTRIL) 5 MG tablet TAKE 1 TABLET BY MOUTH EVERY DAY 90 tablet 2  . metoprolol succinate (TOPROL-XL) 50 MG 24 hr tablet TAKE 1 TABLET (50 MG TOTAL) BY MOUTH DAILY. TAKE WITH OR IMMEDIATELY FOLLOWING A MEAL. 90 tablet 3  . nitroGLYCERIN (NITROSTAT) 0.4 MG SL tablet Place 1 tablet (0.4 mg total)  under the tongue every 5 (five) minutes x 3 doses as needed for chest pain. 30 tablet 3  . omega-3 acid ethyl esters (LOVAZA) 1 g capsule Take 1,000 mg by mouth at bedtime. Reported on 06/05/2015    . pantoprazole (PROTONIX) 40 MG tablet Take 1 tablet (40 mg total) by mouth daily. NEEDS OFFICE VISIT FOR FURTHER REFILLS 90 tablet 0  . rOPINIRole (REQUIP) 2 MG tablet Take 1 tablet (2 mg total) by mouth 2 (two) times daily. 60 tablet 11  . tamsulosin (FLOMAX) 0.4 MG CAPS capsule TAKE 1 CAPSULE BY MOUTH EVERY DAY 90 capsule 3  . albuterol (VENTOLIN HFA) 108 (90 Base) MCG/ACT inhaler Inhale 2 puffs into the lungs every 4 (four) hours as needed for wheezing or shortness of breath. (Patient not taking: Reported on 03/25/2020) 18 g 11  . buPROPion (ZYBAN) 150 MG 12 hr tablet Take 1 tablet (150 mg total) by mouth 2 (two) times daily. Take one tablet a day for 3 days then take twice a day. (Patient not taking: No sig reported) 60 tablet 3  . Tiotropium Bromide-Olodaterol (STIOLTO RESPIMAT) 2.5-2.5 MCG/ACT AERS Inhale 2 puffs into the lungs daily. (Patient not taking: No sig reported) 4 g 11   No current facility-administered medications for this visit.    Allergies  Allergen Reactions  . Codeine Swelling    throat swells     REVIEW OF SYSTEMS:  [X]  denotes positive finding, [ ]  denotes negative finding Cardiac  Comments:  Chest pain or chest pressure:    Shortness of breath upon exertion:    Short of breath when lying flat:    Irregular heart rhythm:  Vascular    Pain in calf, thigh, or hip brought on by ambulation:    Pain in feet at night that wakes you up from your sleep:     Blood clot in your veins:    Leg swelling:         Pulmonary    Oxygen at home:    Productive cough:     Wheezing:         Neurologic    Sudden weakness in arms or legs:     Sudden numbness in arms or legs:     Sudden onset of difficulty speaking or slurred speech:    Temporary loss of vision in one eye:      Problems with dizziness:         Gastrointestinal    Blood in stool:     Vomited blood:         Genitourinary    Burning when urinating:     Blood in urine:        Psychiatric    Major depression:         Hematologic    Bleeding problems:    Problems with blood clotting too easily:        Skin    Rashes or ulcers:        Constitutional    Fever or chills:      PHYSICAL EXAMINATION:  Vitals:   03/25/20 0934  BP: (!) 153/66  Pulse: 71  Resp: 16  Temp: 98.3 F (36.8 C)  TempSrc: Temporal  SpO2: 98%  Weight: 131 lb 3.2 oz (59.5 kg)  Height: 5\' 6"  (1.676 m)    General:  WDWN in NAD; vital signs documented above Gait: Normal HENT: WNL, normocephalic Pulmonary: normal non-labored breathing , without wheezing Cardiac: regular HR, without  Murmurs without carotid bruit Abdomen: soft, NT, no masses. Normal bowel sounds Vascular Exam/Pulses:  Right Left  Radial 2+ (normal) 2+ (normal)  Femoral 2+ (normal) 2+ (normal)  Popliteal Not palpable Not palpable  DP 2+ (normal) 2+ (normal)  PT 2+ (normal) 2+ (normal)   Extremities: without ischemic changes, without Gangrene , without cellulitis; without open wounds;  Musculoskeletal: no muscle wasting or atrophy  Neurologic: A&O X 3;  No focal weakness or paresthesias are detected Psychiatric:  The pt has Normal affect.   Non-Invasive Vascular Imaging:   Endovascular Aortic Repair (EVAR):  +----------+----------------+-------------------+-------------------+       Diameter AP (cm)Diameter Trans (cm)Velocities (cm/sec)  +----------+----------------+-------------------+-------------------+  Aorta   2.93      3.02        143          +----------+----------------+-------------------+-------------------+  Right Limb1.11      1.18        86           +----------+----------------+-------------------+-------------------+  Left Limb 1.14      1.14         68           +----------+----------------+-------------------+-------------------+   Summary:  Abdominal Aorta: Patent endovascular aneurysm repair with no evidence of  endoleak, based on limited visualization due to overlying bowel gas.    ASSESSMENT/PLAN:: 80 y.o. male here for follow up of carotid disease s/p bilateral CEA in 2010 by Dr. Scot Dock. He remains without symptoms. Patient did not have carotid duplex today. He will have this done at his next visit in 1 year. Last carotid duplex had no stenosis bilaterally and patent post surgical sites. Antegrade vertebral flow bilaterally. Elevated  velocities in subclavian arteries bilaterally without symptoms. He also is here to follow up on his FEVAR. This was done in 2017 at [facility] but we are now following it. He is not having any symptoms associated with mesenteric ischemia or mal perfusion of lower extremities. His EVAR duplex today shows patent repair with no evidence of endoleak. Aneurysms sac size is smaller than prior study.  - Discussed smoking cessation - He will continue his Aspirin, Statin and Plavix - He will follow up earlier if he has any new or worsening symptoms - He fill return in 1 year for follow up with Carotid duplex and EVAR duplex   Karoline Caldwell, PA-C [facility] [phone]  Clinic MD: Dr. Carlis Abbott

## 2020-03-26 ENCOUNTER — Other Ambulatory Visit (HOSPITAL_COMMUNITY): Payer: Self-pay

## 2020-03-26 MED ORDER — CLOPIDOGREL BISULFATE 75 MG PO TABS
75.0000 mg | ORAL_TABLET | Freq: Every day | ORAL | 2 refills | Status: DC
Start: 2020-03-26 — End: 2020-07-29

## 2020-04-06 ENCOUNTER — Other Ambulatory Visit (HOSPITAL_COMMUNITY): Payer: Self-pay | Admitting: Cardiology

## 2020-04-07 MED ORDER — FUROSEMIDE 20 MG PO TABS: 20.0000 mg | ORAL_TABLET | Freq: Every day | ORAL | 2 refills | Status: DC

## 2020-04-07 NOTE — Addendum Note (Signed)
Addended byHarl Bowie, Nolon Nations R on: 4/68/0321 03:00 PM   Modules accepted: Orders

## 2020-04-27 ENCOUNTER — Other Ambulatory Visit: Payer: Self-pay | Admitting: Internal Medicine

## 2020-04-30 ENCOUNTER — Other Ambulatory Visit: Payer: Self-pay | Admitting: Family Medicine

## 2020-06-08 ENCOUNTER — Other Ambulatory Visit: Payer: Self-pay | Admitting: Internal Medicine

## 2020-06-10 ENCOUNTER — Ambulatory Visit (HOSPITAL_COMMUNITY)
Admission: RE | Admit: 2020-06-10 | Discharge: 2020-06-10 | Disposition: A | Discharge status: Home / Self Care | Payer: Medicare Other | Source: Ambulatory Visit | Attending: Cardiology | Admitting: Cardiology

## 2020-06-10 ENCOUNTER — Other Ambulatory Visit: Payer: Self-pay

## 2020-06-10 ENCOUNTER — Telehealth: Payer: Self-pay | Admitting: Family Medicine

## 2020-06-10 ENCOUNTER — Encounter (HOSPITAL_COMMUNITY): Payer: Self-pay | Admitting: Cardiology

## 2020-06-10 VITALS — BP 120/78 | HR 60 | Wt 137.6 lb

## 2020-06-10 DIAGNOSIS — I2510 Atherosclerotic heart disease of native coronary artery without angina pectoris: Secondary | ICD-10-CM | POA: Insufficient documentation

## 2020-06-10 DIAGNOSIS — F17210 Nicotine dependence, cigarettes, uncomplicated: Secondary | ICD-10-CM | POA: Diagnosis not present

## 2020-06-10 DIAGNOSIS — I252 Old myocardial infarction: Secondary | ICD-10-CM | POA: Diagnosis not present

## 2020-06-10 DIAGNOSIS — I255 Ischemic cardiomyopathy: Secondary | ICD-10-CM | POA: Insufficient documentation

## 2020-06-10 DIAGNOSIS — Z7982 Long term (current) use of aspirin: Secondary | ICD-10-CM | POA: Diagnosis not present

## 2020-06-10 DIAGNOSIS — I447 Left bundle-branch block, unspecified: Secondary | ICD-10-CM | POA: Diagnosis not present

## 2020-06-10 DIAGNOSIS — Z79899 Other long term (current) drug therapy: Secondary | ICD-10-CM | POA: Insufficient documentation

## 2020-06-10 DIAGNOSIS — I5022 Chronic systolic (congestive) heart failure: Secondary | ICD-10-CM | POA: Insufficient documentation

## 2020-06-10 DIAGNOSIS — Z7902 Long term (current) use of antithrombotics/antiplatelets: Secondary | ICD-10-CM | POA: Diagnosis not present

## 2020-06-10 DIAGNOSIS — E785 Hyperlipidemia, unspecified: Secondary | ICD-10-CM | POA: Insufficient documentation

## 2020-06-10 DIAGNOSIS — I110 Hypertensive heart disease with heart failure: Secondary | ICD-10-CM | POA: Diagnosis not present

## 2020-06-10 DIAGNOSIS — J449 Chronic obstructive pulmonary disease, unspecified: Secondary | ICD-10-CM | POA: Insufficient documentation

## 2020-06-10 LAB — CBC
HCT: 37.6 % — ABNORMAL LOW (ref 39.0–52.0)
Hemoglobin: 12.1 g/dL — ABNORMAL LOW (ref 13.0–17.0)
MCH: 29.1 pg (ref 26.0–34.0)
MCHC: 32.2 g/dL (ref 30.0–36.0)
MCV: 90.4 fL (ref 80.0–100.0)
Platelets: 263 10*3/uL (ref 150–400)
RBC: 4.16 MIL/uL — ABNORMAL LOW (ref 4.22–5.81)
RDW: 15.2 % (ref 11.5–15.5)
WBC: 7.5 10*3/uL (ref 4.0–10.5)
nRBC: 0 % (ref 0.0–0.2)

## 2020-06-10 LAB — BASIC METABOLIC PANEL
Anion gap: 6 (ref 5–15)
BUN: 13 mg/dL (ref 8–23)
CO2: 26 mmol/L (ref 22–32)
Calcium: 8.6 mg/dL — ABNORMAL LOW (ref 8.9–10.3)
Chloride: 105 mmol/L (ref 98–111)
Creatinine, Ser: 0.93 mg/dL (ref 0.61–1.24)
GFR, Estimated: >60 mL/min (ref >60)
Glucose, Bld: 94 mg/dL (ref 70–99)
Potassium: 4.1 mmol/L (ref 3.5–5.1)
Sodium: 137 mmol/L (ref 135–145)

## 2020-06-10 LAB — LIPID PANEL
Cholesterol: 109 mg/dL (ref 0–200)
HDL: 48 mg/dL (ref >40)
LDL Cholesterol: 45 mg/dL (ref 0–99)
Total CHOL/HDL Ratio: 2.3 RATIO
Triglycerides: 78 mg/dL (ref <150)
VLDL: 16 mg/dL (ref 0–40)

## 2020-06-10 MED ORDER — ENTRESTO 24-26 MG PO TABS: 1.0000 | ORAL_TABLET | Freq: Two times a day (BID) | ORAL | 11 refills | Status: DC

## 2020-06-10 MED ORDER — BUPROPION HCL ER (SR) 150 MG PO TB12: ORAL_TABLET | ORAL | 5 refills | Status: DC

## 2020-06-10 NOTE — Telephone Encounter (Signed)
Spoke with spouse Hoyle Sauer left message to call the office

## 2020-06-10 NOTE — Telephone Encounter (Signed)
Patient called back and we got him scheduled for CPE 4/28 and 4/21 for labs.. the [facility] is booked until the end of May.

## 2020-06-10 NOTE — Patient Instructions (Addendum)
EKG done today.  Labs done today. We will contact you only if your labs are abnormal.  STOP taking Lisinopril  On Friday April 1st START Entresto 24-26mg  (1 tablet) by mouth 2 times daily.  START Wellbutrin 150mg  (1 tablet) by mouth daily for 3 days THEN INCREASE to 1 tablet by mouth 2 times daily for smoking cessation.   No other medication changes were made. Please continue all current medications as prescribed.  Your physician recommends that you schedule a follow-up appointment soon for an echo, 10 days for a lab only appointment and in 3 months with Dr. Aundra Dubin.   If you have any questions or concerns before your next appointment please send [place] a message through [place] or call our office at [phone].    TO LEAVE A MESSAGE FOR THE NURSE SELECT OPTION 2, PLEASE LEAVE A MESSAGE INCLUDING: . YOUR NAME . DATE OF BIRTH . CALL BACK NUMBER . REASON FOR CALL**this is important as we prioritize the call backs  YOU WILL RECEIVE A CALL BACK THE SAME DAY AS LONG AS YOU CALL BEFORE 4:00 PM   Do the following things EVERYDAY: 1) Weigh yourself in the morning before breakfast. Write it down and keep it in a log. 2) Take your medicines as prescribed 3) Eat low salt foods--Limit salt (sodium) to 2000 mg per day.  4) Stay as active as you can everyday 5) Limit all fluids for the day to less than 2 liters   At the [facility], you and your health needs are our priority. As part of our continuing mission to provide you with exceptional heart care, we have created designated Provider Care Teams. These Care Teams include your primary Cardiologist (physician) and Advanced Practice Providers (APPs- Physician Assistants and Nurse Practitioners) who all work together to provide you with the care you need, when you need it.   You may see any of the following providers on your designated Care Team at your next follow up: Marland Kitchen Dr Glori Bickers . Dr [name] . Darrick Grinder,  NP . Lyda Jester, PA . Audry Riles, PharmD   Please be sure to bring in all your medications bottles to every appointment.

## 2020-06-10 NOTE — Progress Notes (Signed)
Patient ID: Erik [name]., male   DOB: 05/25/1940, 80 y.o.   MRN: [id number] PCP: Dr [name] Vascular: Dr Scot Dock Cardiology: Dr Aundra Dubin   HPI: Erik [name] is a 80 y.o. with a history of COPD, HTN , PAD with bilateral CEAs, ICM, CAD s/p PCI of proximal LAD and proximal RCA (11/5091), chronic systolic HF and tobacco abuse.  He underwent 2-vessel FEVAR with a Z-Fen device in March 2017 for a juxtarenal aneurysm at [facility] with Dr. Sammuel Hines. Also had left renal artery stenting. Now followed at [facility].   Admitted with dyspnea in 08/2013. Found to have 2v CAD and EF 20-25%, which was a new finding. He wore a Lifevest. EF recovered on 05/2015 echo to 55-60%.   Echo was done in 5/20, showing EF 45-50% with septal hypokinesis, mild LVH, moderate AI.  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   He returns today for followup of CHF and CAD.  Still smoking > 1 ppd. Tried Chantix in the past but then it was recalled.  No chest pain.  No dyspnea walking on flat ground or walking up stairs.  No lightheadedness.  No palpitations.  Weight is stable.  He denies claudication.   ECG: NSR, LBBB 160 msec (personally reviewed).   Labs 03/2016, K 3.8, creatinine 1.00 Labs 09/13/13 K 4.1 Creatinine 0.95  Labs 10/15/13 K 4.1 Cr 0.7 => 1.07 Labs 2/19: K 4.2, creatinine 0.98, LDL 49, HDL 43 Labs 5/20: K 4.2, creatinine 0.99, LDL 44, hgb 12.3 Labs 3/21: LDL 48, K 4.1, creatinine 0.91  ROS: All systems negative except as listed in HPI, PMH and Problem List.  Family Status  Relation Name Status  . Brother  Deceased  . Mother  Alive       HTN, Born 53  . Father  Deceased       brain tumor  . Brother  Alive       HLD, Heart disease  . Brother  Alive       HLD, CAD  . Brother  Alive       HLD, MS  . Neg Hx  (Not Specified)    Social History   Socioeconomic History  . Marital status: Married    Spouse name: Not on file  . Number of children: Not on file  . Years of education: Not on file   . Highest education level: Not on file  Occupational History  . Occupation: Retired Development worker, community  Tobacco Use  . Smoking status: Current Every Day Smoker    Packs/day: 1.00    Years: 50.00    Pack years: 50.00    Types: Cigarettes    Last attempt to quit: 08/27/2013    Years since quitting: 6.7  . Smokeless tobacco: Former Systems developer    Quit date: 09/10/2013  . Tobacco comment: off and on since age 64  Vaping Use  . Vaping Use: Never used  Substance and Sexual Activity  . Alcohol use: No  . Drug use: No  . Sexual activity: Yes  Other Topics Concern  . Not on file  Social History Narrative   Lives with wife in [place]. Retired from the post office   Social Determinants of Radio broadcast assistant Strain: Not on file  Food Insecurity: Not on file  Transportation Needs: Not on file  Physical Activity: Not on file  Stress: Not on file  Social Connections: Not on file  Intimate Partner Violence: Not on file  Past Medical History:  Diagnosis Date  . Adenomatous colon polyp   . Aortic aneurysm ([place])   . BPH (benign prostatic hypertrophy)   . CAD (coronary artery disease)   . Carotid artery stenosis    a. Bilateral CEA  . Cataract   . CHF (congestive heart failure) ([place])   . Chronic systolic heart failure (HCC)    a. EF 20-25%, mild LVH, mod HK, mid apicalanteroseptal myocardium, mild Erik, LA mod dilated  . Collagen vascular disease ([place])   . COPD (chronic obstructive pulmonary disease) ([place])   . Coronary artery disease    a. LHC (08/2013): Lmain: short 30% distal, LAD: sml D1 & D2, 70% ostial D1, 95-99% LAD stenosis prox D2 LCx: sml/mod ramus subtot. occluded, 40% ostial set off lg OM1, 40% AV LCx after OM1, RCA: 90% prox (DES to RCA and prox LAD)  . Diverticulosis   . GERD (gastroesophageal reflux disease)   . Heart murmur   . History of colonic polyps   . Hyperlipidemia   . Hyperplastic colon polyp   . Hypertension   . Ischemic cardiomyopathy   . RLS (restless legs  syndrome)     Current Outpatient Medications  Medication Sig Dispense Refill  . acetaminophen (TYLENOL) 325 MG tablet Take 650 mg by mouth every 6 (six) hours as needed (for pain.).     Marland Kitchen aspirin EC 81 MG tablet Take 81 mg by mouth every evening.     Marland Kitchen buPROPion (WELLBUTRIN SR) 150 MG 12 hr tablet Take 1 tablet by mouth daily for 3 days then increase to 1 tablet by mouth 2 times daily. 60 tablet 5  . clopidogrel (PLAVIX) 75 MG tablet Take 1 tablet (75 mg total) by mouth daily. 30 tablet 2  . ezetimibe-simvastatin (VYTORIN) 10-40 MG tablet TAKE 1 TABLET BY MOUTH AT BEDTIME 90 tablet 3  . famotidine (PEPCID) 20 MG tablet Take 1 tablet (20 mg total) by mouth at bedtime. 1 tablet 0  . ferrous sulfate 325 (65 FE) MG tablet TAKE 1 TABLET BY MOUTH EVERY DAY WITH BREAKFAST 90 tablet 0  . furosemide (LASIX) 20 MG tablet Take 1 tablet (20 mg total) by mouth daily. 30 tablet 2  . metoprolol succinate (TOPROL-XL) 50 MG 24 hr tablet TAKE 1 TABLET (50 MG TOTAL) BY MOUTH DAILY. TAKE WITH OR IMMEDIATELY FOLLOWING A MEAL. 90 tablet 3  . nitroGLYCERIN (NITROSTAT) 0.4 MG SL tablet Place 1 tablet (0.4 mg total) under the tongue every 5 (five) minutes x 3 doses as needed for chest pain. 30 tablet 3  . omega-3 acid ethyl esters (LOVAZA) 1 g capsule Take 1,000 mg by mouth at bedtime. Reported on 06/05/2015    . pantoprazole (PROTONIX) 40 MG tablet Take 1 tablet (40 mg total) by mouth daily. NEEDS OFFICE VISIT FOR FURTHER REFILLS 90 tablet 0  . rOPINIRole (REQUIP) 2 MG tablet Take 1 tablet (2 mg total) by mouth 2 (two) times daily. 60 tablet 11  . sacubitril-valsartan (ENTRESTO) 24-26 MG Take 1 tablet by mouth 2 (two) times daily. 60 tablet 11  . tamsulosin (FLOMAX) 0.4 MG CAPS capsule TAKE 1 CAPSULE BY MOUTH EVERY DAY 90 capsule 0   No current facility-administered medications for this encounter.    Vitals:   06/10/20 0903  BP: 120/78  Pulse: 60  SpO2: 99%  Weight: 62.4 kg (137 lb 9.6 oz)     PHYSICAL  EXAM: General: NAD Neck: No JVD, no thyromegaly or thyroid nodule.  Lungs: Clear to auscultation  bilaterally with normal respiratory effort. CV: Nondisplaced PMI.  Heart regular S1/S2, no S3/S4, 1/6 SEM RUSB.  No peripheral edema.  Soft bilateral carotid bruits.  Difficult to palpate pedal pulses.  Abdomen: Soft, nontender, no hepatosplenomegaly, no distention.  Skin: Intact without lesions or rashes.  Neurologic: Alert and oriented x 3.  Psych: Normal affect. Extremities: No clubbing or cyanosis.  HEENT: Normal.   ASSESSMENT & PLAN:  1. Chronic Systolic Heart Failure: Ischemic cardiomyopathy, EF 20-25% (08/2013), EF recovered on 2017 echo with EF 55-60%.  Most recent echo in 5/20 with EF 45-50%, septal hypokinesis, moderate AI.  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  NYHA class I-II symptoms. He is not volume overloaded on exam.  - Continue Lasix 20 mg daily.  - Stop lisinopril.  In 36 hours, start Entresto 24/26 bid with BMET today and again in 10 days.   - Continue Toprol XL 50 mg daily.  - Need to reassess LV systolic function, will arrange for echo.  2. CAD: LHC 09/11/13 with 2 vessel disease that required PCI of proximal LAD and proximal RCA.  Cardiolite in 6/20 with no ischemia but fixed apical septal defect.  No chest pain.  - Continue Plavix + ASA. Will be on Plavix indefinitely.  - Continue Vytorin, check lipids today. 3. COPD: No longer smoking, normal spirometry in 2014.  4. H/O of bilateral CEAs: Followed by [facility]. - Continue ASA and Plavix.  - Carotid dopplers will be done at [facility].  5. History of aortobiiliac endograft with Dr. Sammuel Hines at [facility]. Follows for PAD with [facility] now. No claudication.  - Sees [facility] regularly.  6. Hyperlipidemia: Continue Vytorin, check lipids today.   Followup in 3 months.   [name] 06/10/2020

## 2020-06-10 NOTE — Telephone Encounter (Signed)
Please schedule MWV with nurse and CPE with Dr. Bedsole. 

## 2020-06-18 ENCOUNTER — Telehealth: Payer: Self-pay | Admitting: Family Medicine

## 2020-06-18 DIAGNOSIS — E785 Hyperlipidemia, unspecified: Secondary | ICD-10-CM

## 2020-06-18 DIAGNOSIS — R7303 Prediabetes: Secondary | ICD-10-CM

## 2020-06-18 NOTE — Telephone Encounter (Signed)
-----   Message from Cloyd Stagers, RT sent at 06/16/2020 11:10 AM EDT ----- Regarding: Lab Orders for Thursday 4.21.2022 Please place lab orders for Thursday 4.21.2022, office visit for physical on Thursday 4.28.2022 Thank you, Dyke Maes RT(R)

## 2020-06-20 ENCOUNTER — Ambulatory Visit (HOSPITAL_COMMUNITY)
Admission: RE | Admit: 2020-06-20 | Discharge: 2020-06-20 | Disposition: A | Discharge status: Home / Self Care | Payer: Medicare Other | Source: Ambulatory Visit | Attending: Cardiology | Admitting: Cardiology

## 2020-06-20 ENCOUNTER — Other Ambulatory Visit: Payer: Self-pay

## 2020-06-20 DIAGNOSIS — I5022 Chronic systolic (congestive) heart failure: Secondary | ICD-10-CM | POA: Insufficient documentation

## 2020-06-20 LAB — BASIC METABOLIC PANEL
Anion gap: 6 (ref 5–15)
BUN: 13 mg/dL (ref 8–23)
CO2: 28 mmol/L (ref 22–32)
Calcium: 8.7 mg/dL — ABNORMAL LOW (ref 8.9–10.3)
Chloride: 103 mmol/L (ref 98–111)
Creatinine, Ser: 1.07 mg/dL (ref 0.61–1.24)
GFR, Estimated: >60 mL/min (ref >60)
Glucose, Bld: 98 mg/dL (ref 70–99)
Potassium: 4.4 mmol/L (ref 3.5–5.1)
Sodium: 137 mmol/L (ref 135–145)

## 2020-07-02 ENCOUNTER — Other Ambulatory Visit (HOSPITAL_COMMUNITY): Payer: Self-pay | Admitting: Cardiology

## 2020-07-03 ENCOUNTER — Other Ambulatory Visit (INDEPENDENT_AMBULATORY_CARE_PROVIDER_SITE_OTHER): Payer: Medicare Other

## 2020-07-03 ENCOUNTER — Other Ambulatory Visit: Payer: Self-pay

## 2020-07-03 DIAGNOSIS — R7303 Prediabetes: Secondary | ICD-10-CM

## 2020-07-03 DIAGNOSIS — E785 Hyperlipidemia, unspecified: Secondary | ICD-10-CM

## 2020-07-03 LAB — COMPREHENSIVE METABOLIC PANEL
ALT: 9 U/L (ref 0–53)
AST: 12 U/L (ref 0–37)
Albumin: 3.8 g/dL (ref 3.5–5.2)
Alkaline Phosphatase: 66 U/L (ref 39–117)
BUN: 13 mg/dL (ref 6–23)
CO2: 29 mEq/L (ref 19–32)
Calcium: 9 mg/dL (ref 8.4–10.5)
Chloride: 100 mEq/L (ref 96–112)
Creatinine, Ser: 1.12 mg/dL (ref 0.40–1.50)
GFR: 62.68 mL/min (ref >60.00)
Glucose, Bld: 94 mg/dL (ref 70–99)
Potassium: 4.2 mEq/L (ref 3.5–5.1)
Sodium: 135 mEq/L (ref 135–145)
Total Bilirubin: 0.5 mg/dL (ref 0.2–1.2)
Total Protein: 7.5 g/dL (ref 6.0–8.3)

## 2020-07-03 LAB — HEMOGLOBIN A1C: Hgb A1c MFr Bld: 5.6 % (ref 4.6–6.5)

## 2020-07-03 LAB — LIPID PANEL
Cholesterol: 100 mg/dL (ref 0–200)
HDL: 45.6 mg/dL (ref >39.00)
LDL Cholesterol: 37 mg/dL (ref 0–99)
NonHDL: 54.24
Total CHOL/HDL Ratio: 2
Triglycerides: 86 mg/dL (ref 0.0–149.0)
VLDL: 17.2 mg/dL (ref 0.0–40.0)

## 2020-07-03 NOTE — Progress Notes (Signed)
No critical labs need to be addressed urgently. We will discuss labs in detail at upcoming office visit.   

## 2020-07-04 ENCOUNTER — Other Ambulatory Visit (HOSPITAL_COMMUNITY): Payer: Self-pay | Admitting: Cardiology

## 2020-07-10 ENCOUNTER — Ambulatory Visit (INDEPENDENT_AMBULATORY_CARE_PROVIDER_SITE_OTHER): Payer: Medicare Other | Admitting: Family Medicine

## 2020-07-10 ENCOUNTER — Encounter: Payer: Self-pay | Admitting: Family Medicine

## 2020-07-10 ENCOUNTER — Other Ambulatory Visit: Payer: Self-pay

## 2020-07-10 VITALS — BP 160/60 | HR 62 | Temp 98.4°F | Ht 66.0 in | Wt 129.5 lb

## 2020-07-10 DIAGNOSIS — E785 Hyperlipidemia, unspecified: Secondary | ICD-10-CM

## 2020-07-10 DIAGNOSIS — J479 Bronchiectasis, uncomplicated: Secondary | ICD-10-CM | POA: Diagnosis not present

## 2020-07-10 DIAGNOSIS — G2581 Restless legs syndrome: Secondary | ICD-10-CM

## 2020-07-10 DIAGNOSIS — I714 Abdominal aortic aneurysm, without rupture, unspecified: Secondary | ICD-10-CM

## 2020-07-10 DIAGNOSIS — I5022 Chronic systolic (congestive) heart failure: Secondary | ICD-10-CM | POA: Diagnosis not present

## 2020-07-10 DIAGNOSIS — R911 Solitary pulmonary nodule: Secondary | ICD-10-CM | POA: Diagnosis not present

## 2020-07-10 DIAGNOSIS — I10 Essential (primary) hypertension: Secondary | ICD-10-CM | POA: Diagnosis not present

## 2020-07-10 DIAGNOSIS — J449 Chronic obstructive pulmonary disease, unspecified: Secondary | ICD-10-CM

## 2020-07-10 DIAGNOSIS — Z0000 Encounter for general adult medical examination without abnormal findings: Secondary | ICD-10-CM | POA: Diagnosis not present

## 2020-07-10 MED ORDER — ROPINIROLE HCL 3 MG PO TABS: ORAL_TABLET | ORAL | 0 refills | Status: DC

## 2020-07-10 NOTE — Assessment & Plan Note (Signed)
No longer at goal  In office today since change to [facility]. Follow BP at home and he will call cardiology if BP persistently elevated for possible increase in entresto. He has not noted any SE to this medication at [facility] time.

## 2020-07-10 NOTE — Assessment & Plan Note (Signed)
Followed by vascular along with PAD and carotid stenosis s/p CEA bilateral. 

## 2020-07-10 NOTE — Assessment & Plan Note (Signed)
Seen on CT. Per pulmonary this may be more chronic bronchitis change.  Follow up with pulmonary.

## 2020-07-10 NOTE — Patient Instructions (Addendum)
Follow BP at home  over the next week and call cardiologist with results if > 140/90 for adjustment of Entresto. Try higher dose of ropirole at night for better restless leg control.  Call pulmonary for follow up on pulmonary nodule and chronic bronchitis/possible bronchiectasis. Quit smoking! Consider getting shingle vaccine and fourth COVID booster.

## 2020-07-10 NOTE — Assessment & Plan Note (Addendum)
Inadequate control. Try higher dose of ropirole at night for better restless leg  Control.

## 2020-07-10 NOTE — Assessment & Plan Note (Signed)
Slightly fluid overloaded on exam today in office. BP above goal and using lasix daily.

## 2020-07-10 NOTE — Assessment & Plan Note (Signed)
Encouraged smoking cessation. Not interested in trying other inhalers.  encouraged him to follow up with Pulmonary.

## 2020-07-10 NOTE — Assessment & Plan Note (Signed)
Encourage continue CT survillence with lung cancer screening program or at least follow up with pulmonary.

## 2020-07-10 NOTE — Progress Notes (Signed)
Patient ID: Erik [name]., male    DOB: 01-04-1941, 80 y.o.   MRN: [id number]  This visit was conducted in person.  BP (!) 160/60   Pulse 62   Temp 98.4 F (36.9 C) (Temporal)   Ht 5\' 6"  (1.676 m)   Wt 129 lb 8 oz (58.7 kg)   SpO2 98%   BMI 20.90 kg/m    CC:  Chief Complaint  Patient presents with  . Medicare Wellness    Subjective:   HPI: Erik [name]. is a 80 y.o. male presenting on 07/10/2020 for Medicare Wellness  The patient presents for annual medicare wellness, complete physical and review of chronic health problems. He/She also has the following acute concerns today:  I have personally reviewed the Medicare Annual Wellness questionnaire and have noted 1. The patient's medical and social history 2. Their use of alcohol, tobacco or illicit drugs 3. Their current medications and supplements 4. The patient's functional ability including ADL's, fall risks, home safety risks and hearing or visual             impairment. 5. Diet and physical activities 6. Evidence for depression or mood disorders 7.         Updated provider list Cognitive evaluation was performed and recorded on pt medicare questionnaire form. The patients weight, height, BMI and visual acuity have been recorded in the chart  I have made referrals, counseling and provided education to the patient based review of the above and I have provided the pt with a written personalized care plan for preventive services.   Documentation of this information was scanned into the electronic record under the media tab.   Advance directives and end of life planning reviewed in detail with patient and documented in EMR. Patient given handout on advance care directives if needed. HCPOA and living will updated if needed.   Hearing Screening   Method: Audiometry   125Hz  250Hz  500Hz  1000Hz  2000Hz  3000Hz  4000Hz  6000Hz  8000Hz   Right ear:   0 0 0  0    Left ear:   0 0 0  0    Vision Screening Comments: Eye  Exam with Dr. George Ina at [facility]  07/10/2020 05/31/2019 05/30/2018 05/09/2017 04/20/2016  Falls in the past year? 0 0 0 No No  Number falls in past yr: - 0 - - -  Injury with Fall? - 0 - - -  Risk for fall due to : - Medication side effect - - -  Follow up - Falls evaluation completed;Falls prevention discussed - - -    Flowsheet Row Office Visit from 07/10/2020 in [place] at [facility] Total Score 0      HFpEF, ischemic cardiomyopathy followed by Dr. Aundra Dubin.. reviewed OV 06/10/2020 Initially rEF but EF recovered to 55-60% on repeat [facility] in 2017 Most recent echo in 5/20 with EF 45-50%, septal hypokinesis, moderate AI.  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  NYHA class I-II symptoms.  At 3/29/OV.Marland Kitchen lisinopril was stopped and changed to [facility]. toprol , lasix continued. PAD with bilateral CEAs On plavix and ASA indefinately Followed by vascular  With carotid dopplers. CAD s/pPCIs He underwent 2-vessel FEVAR with a Z-Fen device in March 2017 for a juxtarenal aneurysm at [facility] with Dr. Sammuel Hines. Also had left renal artery stenting. Now followed at [facility].   COPD, still smoking: No change in breathing in last year. SOB when  overexerting self.  Abnormal lung cancer screening CT triggered concern for bronchectasis eval for MAI/ TB sputum collection ( returned negative).. Referral to pulmonary Dr. Carlis Abbott and then to ID Dr. Linus Salmons.  he was treated for chronic bronchitis with stiolto and albuterol prn... he states he stopped both of these given no improvement in symptoms ( he is minimally bothered by symtpoms)     RLS:  Ropinirole at dose helps some at night but frequently causes  Hypertension:    Not at goal today... recent change to Entresto BP Readings from Last 3 Encounters:  07/10/20 (!) 160/60  06/10/20 120/78  03/25/20 (!) 153/66  Using medication without problems or lightheadedness:  none Chest pain with exertion: none Edema:  yes at night at end of day Short of breath:none Average home BPs: not checking lately. Other issues: No SE to Entresto.  Elevated Cholesterol:  LDL at goal on Vytorin Lab Results  Component Value Date   CHOL 100 07/03/2020   HDL 45.60 07/03/2020   LDLCALC 37 07/03/2020   LDLDIRECT 176.9 05/09/2008   TRIG 86.0 07/03/2020   CHOLHDL 2 07/03/2020  Using medications without problems: none Muscle aches:  none Diet compliance: Exercise: minimal Other complaints:      Relevant past medical, surgical, family and social history reviewed and updated as indicated. Interim medical history since our last visit reviewed. Allergies and medications reviewed and updated. Outpatient Medications Prior to Visit  Medication Sig Dispense Refill  . acetaminophen (TYLENOL) 325 MG tablet Take 650 mg by mouth every 6 (six) hours as needed (for pain.).     Marland Kitchen aspirin EC 81 MG tablet Take 81 mg by mouth every evening.     Marland Kitchen buPROPion (WELLBUTRIN SR) 150 MG 12 hr tablet TAKE 1 TABLET BY MOUTH DAILY FOR 3 DAYS THEN INCREASE TO 1 TABLET BY MOUTH 2 TIMES DAILY. 180 tablet 2  . clopidogrel (PLAVIX) 75 MG tablet Take 1 tablet (75 mg total) by mouth daily. 30 tablet 2  . ezetimibe-simvastatin (VYTORIN) 10-40 MG tablet TAKE 1 TABLET BY MOUTH AT BEDTIME 90 tablet 3  . famotidine (PEPCID) 20 MG tablet Take 1 tablet (20 mg total) by mouth at bedtime. 1 tablet 0  . ferrous sulfate 325 (65 FE) MG tablet TAKE 1 TABLET BY MOUTH EVERY DAY WITH BREAKFAST 90 tablet 0  . furosemide (LASIX) 20 MG tablet TAKE 1 TABLET BY MOUTH EVERY DAY 90 tablet 0  . metoprolol succinate (TOPROL-XL) 50 MG 24 hr tablet TAKE 1 TABLET (50 MG TOTAL) BY MOUTH DAILY. TAKE WITH OR IMMEDIATELY FOLLOWING A MEAL. 90 tablet 3  . nitroGLYCERIN (NITROSTAT) 0.4 MG SL tablet Place 1 tablet (0.4 mg total) under the tongue every 5 (five) minutes x 3 doses as needed for chest pain. 30 tablet 3  . omega-3 acid ethyl esters (LOVAZA) 1 g capsule Take 1,000 mg by  mouth at bedtime. Reported on 06/05/2015    . pantoprazole (PROTONIX) 40 MG tablet Take 1 tablet (40 mg total) by mouth daily. NEEDS OFFICE VISIT FOR FURTHER REFILLS 90 tablet 0  . rOPINIRole (REQUIP) 2 MG tablet Take 1 tablet (2 mg total) by mouth 2 (two) times daily. 60 tablet 11  . sacubitril-valsartan (ENTRESTO) 24-26 MG Take 1 tablet by mouth 2 (two) times daily. 60 tablet 11  . tamsulosin (FLOMAX) 0.4 MG CAPS capsule TAKE 1 CAPSULE BY MOUTH EVERY DAY 90 capsule 0   No facility-administered medications prior to visit.     Per HPI unless specifically indicated  in ROS section below Review of Systems  Constitutional: Negative for fatigue and fever.  HENT: Negative for ear pain.   Eyes: Negative for pain.  Respiratory: Positive for cough. Negative for shortness of breath.   Cardiovascular: Negative for chest pain, palpitations and leg swelling.  Gastrointestinal: Negative for abdominal pain.  Genitourinary: Negative for dysuria.  Musculoskeletal: Positive for back pain. Negative for arthralgias.  Neurological: Negative for syncope, light-headedness and headaches.  Psychiatric/Behavioral: Negative for dysphoric mood.   Objective:  BP (!) 160/60   Pulse 62   Temp 98.4 F (36.9 C) (Temporal)   Ht 5\' 6"  (1.676 m)   Wt 129 lb 8 oz (58.7 kg)   SpO2 98%   BMI 20.90 kg/m   Wt Readings from Last 3 Encounters:  07/10/20 129 lb 8 oz (58.7 kg)  06/10/20 137 lb 9.6 oz (62.4 kg)  03/25/20 131 lb 3.2 oz (59.5 kg)      Physical Exam Constitutional:      Appearance: He is well-developed.  HENT:     Head: Normocephalic.     Right Ear: Hearing normal.     Left Ear: Hearing normal.     Nose: Nose normal.  Neck:     Thyroid: No thyroid mass or thyromegaly.     Vascular: No carotid bruit.     Trachea: Trachea normal.  Cardiovascular:     Rate and Rhythm: Normal rate and regular rhythm.     Pulses: Normal pulses.     Heart sounds: Heart sounds not distant. No murmur heard. No friction  rub. No gallop.      Comments: No peripheral edema Pulmonary:     Effort: Pulmonary effort is normal. No respiratory distress.     Breath sounds: Rhonchi present.  Skin:    General: Skin is warm and dry.     Findings: No rash.  Psychiatric:        Speech: Speech normal.        Behavior: Behavior normal.        Thought Content: Thought content normal.       Results for orders placed or performed in visit on 07/03/20  Comprehensive metabolic panel  Result Value Ref Range   Sodium 135 135 - 145 mEq/L   Potassium 4.2 3.5 - 5.1 mEq/L   Chloride 100 96 - 112 mEq/L   CO2 29 19 - 32 mEq/L   Glucose, Bld 94 70 - 99 mg/dL   BUN 13 6 - 23 mg/dL   Creatinine, Ser 1.12 0.40 - 1.50 mg/dL   Total Bilirubin 0.5 0.2 - 1.2 mg/dL   Alkaline Phosphatase 66 39 - 117 U/L   AST 12 0 - 37 U/L   ALT 9 0 - 53 U/L   Total Protein 7.5 6.0 - 8.3 g/dL   Albumin 3.8 3.5 - 5.2 g/dL   GFR 62.68 >60.00 mL/min   Calcium 9.0 8.4 - 10.5 mg/dL  Lipid panel  Result Value Ref Range   Cholesterol 100 0 - 200 mg/dL   Triglycerides 86.0 0.0 - 149.0 mg/dL   HDL 45.60 >39.00 mg/dL   VLDL 17.2 0.0 - 40.0 mg/dL   LDL Cholesterol 37 0 - 99 mg/dL   Total CHOL/HDL Ratio 2    NonHDL 54.24   Hemoglobin A1c  Result Value Ref Range   Hgb A1c MFr Bld 5.6 4.6 - 6.5 %    This visit occurred during the SARS-CoV-2 public health emergency.  Safety protocols were in place, including screening  questions prior to the visit, additional usage of staff PPE, and extensive cleaning of exam room while observing appropriate contact time as indicated for disinfecting solutions.   COVID 19 screen:  No recent travel or known exposure to COVID19 The patient denies respiratory symptoms of COVID 19 at this time. The importance of social distancing was discussed today.   Assessment and Plan The patient's preventative maintenance and recommended screening tests for an annual wellness exam were reviewed in full today. Brought up to date  unless services declined.  Counselled on the importance of diet, exercise, and its role in overall health and mortality. The patient's FH and SH was reviewed, including their home life, tobacco status, and drug and alcohol status.   Due for Tdap.. refused. Uptodate with PNA and  COVID vaccine x 3. Shingles:not interested  PSA: Hx of BPH on tamsulosin. PSA not indicated  Colon: 09/2018 polyps  Dr. Hilarie Fredrickson, no further indicated  Smoker: spirometry performed 2017nml. Pulmonary nodules:2021 in lung cancer screening program: Dominant 6.9 mm apical right upper lobe solid pulmonary nodule. Short-term follow-up in 6 months is recommended with repeat low-dose chest CT without contrast He did not do 6 month repeat CT... encouraged today and recommended pulmonary referral for bornchiectasis  Due for hep C testing  Problem List Items Addressed This Visit    Abdominal aortic aneurysm (AAA) ([place])    Followed by vascular along with PAD and carotid stenosis s/p CEA bilateral.      Bronchiectasis ([place])    Seen on CT. Per pulmonary this may be more chronic bronchitis change.  Follow up with pulmonary.      Chronic systolic heart failure (HCC)    Slightly fluid overloaded on exam today in office. BP above goal and using lasix daily.      COPD, mild ([place]) (Chronic)    Encouraged smoking cessation. Not interested in trying other inhalers.  encouraged him to follow up with Pulmonary.      HTN (hypertension)    No longer at goal  In office today since change to [facility]. Follow BP at home and he will call cardiology if BP persistently elevated for possible increase in entresto. He has not noted any SE to this medication at [facility] time.      Hyperlipidemia (Chronic)    Stable, chronic.  Continue current medication.   Vytorin 10/40 mg daily      Restless leg syndrome    Inadequate control. Try higher dose of ropirole at night for better restless leg  Control.      Solitary pulmonary  nodule    Encourage continue CT survillence with lung cancer screening program or at least follow up with pulmonary.       Other Visit Diagnoses    Medicare annual wellness visit, subsequent    -  Primary       [name], MD

## 2020-07-10 NOTE — Assessment & Plan Note (Signed)
Stable, chronic.  Continue current medication.   Vytorin 10/40 mg daily

## 2020-07-14 ENCOUNTER — Telehealth (HOSPITAL_COMMUNITY): Payer: Self-pay | Admitting: Pharmacy Technician

## 2020-07-14 ENCOUNTER — Other Ambulatory Visit (HOSPITAL_COMMUNITY): Payer: Self-pay

## 2020-07-14 ENCOUNTER — Telehealth (HOSPITAL_COMMUNITY): Payer: Self-pay | Admitting: *Deleted

## 2020-07-14 NOTE — Telephone Encounter (Signed)
Patient Advocate Encounter   Received notification from [place] that prior authorization for Erik [name] is required.   PA submitted on CoverMyMeds Key : [id number] Status is pending   Will continue to follow.

## 2020-07-14 NOTE — Telephone Encounter (Signed)
Pt states he can not get his Entresto refilled b/c it needs PA. Sample left a front desk for pt, message sent to [place], CPhT to assist with PA  Medication Samples have been provided to the patient.  Drug name: [name]       Strength: 24/26mg         Qty: 1  LOT: [id number]  Exp.Date: 4/24  Dosing instructions: take 1 tab Twice daily   The patient has been instructed regarding the correct time, dose, and frequency of taking this medication, including desired effects and most common side effects.   Toinette Lackie 1:46 PM 07/14/2020

## 2020-07-15 ENCOUNTER — Other Ambulatory Visit (HOSPITAL_COMMUNITY): Payer: Self-pay

## 2020-07-15 ENCOUNTER — Ambulatory Visit (HOSPITAL_COMMUNITY): Admission: RE | Admit: 2020-07-15 | Payer: Medicare Other | Source: Ambulatory Visit

## 2020-07-15 NOTE — Telephone Encounter (Signed)
Advanced Heart Failure Patient Advocate Encounter  Prior Authorization for [name] has been approved.    PA# [id number] Effective dates: 07/14/20 through 07/15/23  Charlann Boxer, CPhT

## 2020-07-21 ENCOUNTER — Ambulatory Visit (HOSPITAL_COMMUNITY)
Admission: RE | Admit: 2020-07-21 | Discharge: 2020-07-21 | Disposition: A | Discharge status: Home / Self Care | Payer: Medicare Other | Source: Ambulatory Visit | Attending: Cardiology | Admitting: Cardiology

## 2020-07-21 ENCOUNTER — Encounter: Payer: Self-pay | Admitting: Acute Care

## 2020-07-21 ENCOUNTER — Other Ambulatory Visit: Payer: Self-pay

## 2020-07-21 DIAGNOSIS — I255 Ischemic cardiomyopathy: Secondary | ICD-10-CM | POA: Diagnosis not present

## 2020-07-21 DIAGNOSIS — F17200 Nicotine dependence, unspecified, uncomplicated: Secondary | ICD-10-CM | POA: Insufficient documentation

## 2020-07-21 DIAGNOSIS — Z955 Presence of coronary angioplasty implant and graft: Secondary | ICD-10-CM | POA: Diagnosis not present

## 2020-07-21 DIAGNOSIS — I509 Heart failure, unspecified: Secondary | ICD-10-CM | POA: Insufficient documentation

## 2020-07-21 DIAGNOSIS — I080 Rheumatic disorders of both mitral and aortic valves: Secondary | ICD-10-CM | POA: Diagnosis not present

## 2020-07-21 DIAGNOSIS — J449 Chronic obstructive pulmonary disease, unspecified: Secondary | ICD-10-CM | POA: Diagnosis not present

## 2020-07-21 DIAGNOSIS — E785 Hyperlipidemia, unspecified: Secondary | ICD-10-CM | POA: Diagnosis not present

## 2020-07-21 DIAGNOSIS — I110 Hypertensive heart disease with heart failure: Secondary | ICD-10-CM | POA: Insufficient documentation

## 2020-07-21 DIAGNOSIS — I5022 Chronic systolic (congestive) heart failure: Secondary | ICD-10-CM

## 2020-07-21 DIAGNOSIS — Z8679 Personal history of other diseases of the circulatory system: Secondary | ICD-10-CM | POA: Insufficient documentation

## 2020-07-21 LAB — ECHOCARDIOGRAM COMPLETE
Area-P 1/2: 3.65 cm2
MV M vel: 6.7 m/s
MV Peak grad: 179.6 mmHg
P 1/2 time: 416 msec
Radius: 0.5 cm
S' Lateral: 4.1 cm

## 2020-07-21 NOTE — Progress Notes (Signed)
  Echocardiogram 2D Echocardiogram with 3D and strain has been performed.  Erik [name] M 07/21/2020, 8:48 AM

## 2020-07-22 ENCOUNTER — Other Ambulatory Visit: Payer: Self-pay | Admitting: *Deleted

## 2020-07-22 DIAGNOSIS — F17210 Nicotine dependence, cigarettes, uncomplicated: Secondary | ICD-10-CM

## 2020-07-22 DIAGNOSIS — Z87891 Personal history of nicotine dependence: Secondary | ICD-10-CM

## 2020-07-23 ENCOUNTER — Telehealth (HOSPITAL_COMMUNITY): Payer: Self-pay

## 2020-07-23 ENCOUNTER — Other Ambulatory Visit (HOSPITAL_COMMUNITY): Payer: Self-pay

## 2020-07-23 ENCOUNTER — Telehealth (HOSPITAL_COMMUNITY): Payer: Self-pay | Admitting: Pharmacy Technician

## 2020-07-23 MED ORDER — ENTRESTO 24-26 MG PO TABS: 1.0000 | ORAL_TABLET | Freq: Two times a day (BID) | ORAL | 3 refills | Status: DC

## 2020-07-23 NOTE — Telephone Encounter (Signed)
I received a message that the patient's Erik [name] co-pay is $200, which is unaffordable.  Was able to activate a $10 co-pay card for the patient. His insurance will allow 90 day billing. Sent Tracey (RN) a request to send in a new 38 day RX for Entresto to his pharmacy.  BIN [id number] PCN OHCP ID [id number] Group [id number]  Called and provided the pharmacy with the co-pay card information. Spoke with the patient's grandson as well.  Charlann Boxer, CPhT

## 2020-07-23 NOTE — Telephone Encounter (Signed)
See pharmacy note

## 2020-07-23 NOTE — Telephone Encounter (Signed)
Staff message sent to [place] to assist patient with lowering cost of Entresto. Pt reports is $200 a month.

## 2020-07-25 ENCOUNTER — Other Ambulatory Visit: Payer: Self-pay | Admitting: Internal Medicine

## 2020-07-26 ENCOUNTER — Other Ambulatory Visit: Payer: Self-pay | Admitting: Family Medicine

## 2020-07-28 ENCOUNTER — Other Ambulatory Visit (HOSPITAL_COMMUNITY): Payer: Self-pay | Admitting: Cardiology

## 2020-07-29 ENCOUNTER — Other Ambulatory Visit (HOSPITAL_COMMUNITY): Payer: Self-pay | Admitting: *Deleted

## 2020-07-29 MED ORDER — CLOPIDOGREL BISULFATE 75 MG PO TABS: 75.0000 mg | ORAL_TABLET | Freq: Every day | ORAL | 2 refills | Status: DC

## 2020-08-02 ENCOUNTER — Other Ambulatory Visit (HOSPITAL_COMMUNITY): Payer: Self-pay | Admitting: Cardiology

## 2020-08-06 ENCOUNTER — Ambulatory Visit
Admission: RE | Admit: 2020-08-06 | Discharge: 2020-08-06 | Disposition: A | Discharge status: Home / Self Care | Payer: Medicare Other | Source: Ambulatory Visit | Attending: Acute Care | Admitting: Acute Care

## 2020-08-06 ENCOUNTER — Other Ambulatory Visit: Payer: Self-pay

## 2020-08-06 DIAGNOSIS — Z87891 Personal history of nicotine dependence: Secondary | ICD-10-CM

## 2020-08-06 DIAGNOSIS — I2510 Atherosclerotic heart disease of native coronary artery without angina pectoris: Secondary | ICD-10-CM | POA: Diagnosis not present

## 2020-08-06 DIAGNOSIS — J432 Centrilobular emphysema: Secondary | ICD-10-CM | POA: Diagnosis not present

## 2020-08-06 DIAGNOSIS — J181 Lobar pneumonia, unspecified organism: Secondary | ICD-10-CM | POA: Diagnosis not present

## 2020-08-06 DIAGNOSIS — J929 Pleural plaque without asbestos: Secondary | ICD-10-CM | POA: Diagnosis not present

## 2020-08-06 DIAGNOSIS — F17210 Nicotine dependence, cigarettes, uncomplicated: Secondary | ICD-10-CM

## 2020-08-19 ENCOUNTER — Other Ambulatory Visit: Payer: Self-pay | Admitting: Family Medicine

## 2020-08-21 ENCOUNTER — Ambulatory Visit (HOSPITAL_COMMUNITY)
Admission: RE | Admit: 2020-08-21 | Discharge: 2020-08-21 | Disposition: A | Discharge status: Home / Self Care | Payer: Medicare Other | Source: Ambulatory Visit | Attending: Cardiology | Admitting: Cardiology

## 2020-08-21 ENCOUNTER — Other Ambulatory Visit: Payer: Self-pay

## 2020-08-21 ENCOUNTER — Encounter (HOSPITAL_COMMUNITY): Payer: Self-pay | Admitting: Cardiology

## 2020-08-21 VITALS — BP 140/60 | HR 57 | Wt 130.8 lb

## 2020-08-21 DIAGNOSIS — I5022 Chronic systolic (congestive) heart failure: Secondary | ICD-10-CM | POA: Insufficient documentation

## 2020-08-21 LAB — BASIC METABOLIC PANEL
Anion gap: 6 (ref 5–15)
BUN: 16 mg/dL (ref 8–23)
CO2: 27 mmol/L (ref 22–32)
Calcium: 8.8 mg/dL — ABNORMAL LOW (ref 8.9–10.3)
Chloride: 102 mmol/L (ref 98–111)
Creatinine, Ser: 0.94 mg/dL (ref 0.61–1.24)
GFR, Estimated: >60 mL/min (ref >60)
Glucose, Bld: 109 mg/dL — ABNORMAL HIGH (ref 70–99)
Potassium: 4.6 mmol/L (ref 3.5–5.1)
Sodium: 135 mmol/L (ref 135–145)

## 2020-08-21 MED ORDER — SPIRONOLACTONE 25 MG PO TABS: 12.5000 mg | ORAL_TABLET | Freq: Every day | ORAL | 6 refills | Status: DC

## 2020-08-21 NOTE — Patient Instructions (Signed)
Start Spironolactone 12.5 mg (1/2 tab) Daily  Labs done today, your results will be available in MyChart, we will contact you for abnormal readings.  Your physician recommends that you return for lab work in: 10-14 days  Please call our office in November to schedule your follow up appointment  If you have any questions or concerns before your next appointment please send [place] a message through [place] or call our office at [phone].    TO LEAVE A MESSAGE FOR THE NURSE SELECT OPTION 2, PLEASE LEAVE A MESSAGE INCLUDING: YOUR NAME DATE OF BIRTH CALL BACK NUMBER REASON FOR CALL**this is important as we prioritize the call backs  YOU WILL RECEIVE A CALL BACK THE SAME DAY AS LONG AS YOU CALL BEFORE 4:00 PM  At the [facility], you and your health needs are our priority. As part of our continuing mission to provide you with exceptional heart care, we have created designated Provider Care Teams. These Care Teams include your primary Cardiologist (physician) and Advanced Practice Providers (APPs- Physician Assistants and Nurse Practitioners) who all work together to provide you with the care you need, when you need it.   You may see any of the following providers on your designated Care Team at your next follow up: Dr Glori Bickers Dr [name] Dr Patrice Paradise, NP Lyda Jester, [place] Ginnie Smart Audry Riles, PharmD   Please be sure to bring in all your medications bottles to every appointment.

## 2020-08-21 NOTE — Progress Notes (Signed)
I have called the patient with the results of his CT Chest. I explained that the previous nodule of concern has been stable, but that there is a new nodule that will need a 3 month follow up. Radiology feels this is infectious / inflammatory, but we will check a 3 month follow up CT Chest without contrast to ensure that is the case.  He verbalized understanding and had no further questions upon completion of the call.  Erik [name] He understands he will get a call closer to the time to schedule.  Denise, please place order for CT Chest without contrast for 3 months, and fax results to PCP. Thanks so much

## 2020-08-22 ENCOUNTER — Other Ambulatory Visit: Payer: Self-pay | Admitting: *Deleted

## 2020-08-22 DIAGNOSIS — R918 Other nonspecific abnormal finding of lung field: Secondary | ICD-10-CM

## 2020-08-22 NOTE — Progress Notes (Signed)
Patient ID: Erik [name]., male   DOB: 1940/10/09, 80 y.o.   MRN: [id number] PCP: Dr [name] Vascular: Dr Scot Dock Cardiology: Dr Aundra Dubin   HPI: Erik [name] is a 80 y.o. with a history of COPD, HTN , PAD with bilateral CEAs, ICM, CAD s/p PCI of proximal LAD and proximal RCA (10/6379), chronic systolic HF and tobacco abuse.  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 Sammuel Hines. Also had left renal artery stenting. Now followed at [facility].   Admitted with dyspnea in 08/2013. Found to have 2v CAD and EF 20-25%, which was a new finding. He wore a Lifevest. EF recovered on 05/2015 echo to 55-60%.   Echo was done in 5/20, showing EF 45-50% with septal hypokinesis, mild LVH, moderate AI.  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  Echo in 5/22 showed EF 40-45%, mild LVH, RV normal, moderate Erik, moderate AI.  He returns today for followup of CHF and CAD.  Still smoking > 1 ppd. Tried Chantix in the past but then it was recalled.  Rare atypical chest pain, lasts for a few seconds.  No significant exertional dyspnea.  No lightheadedness.  Weight down 7 lbs.   ECG: NSR, LBBB 160 msec (personally reviewed).   Labs 03/2016, K 3.8, creatinine 1.00 Labs 09/13/13 K 4.1 Creatinine 0.95  Labs 10/15/13 K 4.1 Cr 0.7 => 1.07 Labs 2/19: K 4.2, creatinine 0.98, LDL 49, HDL 43 Labs 5/20: K 4.2, creatinine 0.99, LDL 44, hgb 12.3 Labs 3/21: LDL 48, K 4.1, creatinine 0.91 Labs 4/22 LDL 37, K 4.2, creatinine 1.18  ROS: All systems negative except as listed in HPI, PMH and Problem List.  Family Status  Relation Name Status   Brother  Deceased   Mother  Alive       HTN, Born 54   Father  Deceased       brain tumor   Brother  Alive       HLD, Heart disease   Brother  Alive       HLD, CAD   Brother  Alive       HLD, MS   Neg Hx  (Not Specified)    Social History   Socioeconomic History   Marital status: Married    Spouse name:  Not on file   Number of children: Not on file   Years of education: Not on file   Highest education level: Not on file  Occupational History   Occupation: Retired Development worker, community  Tobacco Use   Smoking status: Every Day    Packs/day: 1.00    Years: 50.00    Pack years: 50.00    Types: Cigarettes    Last attempt to quit: 08/27/2013    Years since quitting: 6.9   Smokeless tobacco: Former    Quit date: 09/10/2013   Tobacco comments:    off and on since age 85  Vaping Use   Vaping Use: Never used  Substance and Sexual Activity   Alcohol use: No   Drug use: No   Sexual activity: Yes  Other Topics Concern   Not on file  Social History Narrative   Lives with wife in [place]. Retired from the post office   Social Determinants of Radio broadcast assistant Strain: Not on file  Food Insecurity: Not on file  Transportation Needs: Not on file  Physical Activity: Not on file  Stress: Not  on file  Social Connections: Not on file  Intimate Partner Violence: Not on file    Past Medical History:  Diagnosis Date   Adenomatous colon polyp    Aortic aneurysm (HCC)    BPH (benign prostatic hypertrophy)    CAD (coronary artery disease)    Carotid artery stenosis    a. Bilateral CEA   Cataract    CHF (congestive heart failure) (HCC)    Chronic systolic heart failure (HCC)    a. EF 20-25%, mild LVH, mod HK, mid apicalanteroseptal myocardium, mild Erik, LA mod dilated   Collagen vascular disease (HCC)    COPD (chronic obstructive pulmonary disease) ([place])    Coronary artery disease    a. LHC (08/2013): Lmain: short 30% distal, LAD: sml D1 & D2, 70% ostial D1, 95-99% LAD stenosis prox D2 LCx: sml/mod ramus subtot. occluded, 40% ostial set off lg OM1, 40% AV LCx after OM1, RCA: 90% prox (DES to RCA and prox LAD)   Diverticulosis    GERD (gastroesophageal reflux disease)    Heart murmur    History of colonic polyps    Hyperlipidemia    Hyperplastic colon polyp    Hypertension    Ischemic  cardiomyopathy    RLS (restless legs syndrome)     Current Outpatient Medications  Medication Sig Dispense Refill   acetaminophen (TYLENOL) 325 MG tablet Take 650 mg by mouth every 6 (six) hours as needed (for pain.).      aspirin EC 81 MG tablet Take 81 mg by mouth every evening.      clopidogrel (PLAVIX) 75 MG tablet Take 1 tablet (75 mg total) by mouth daily. 30 tablet 2   ezetimibe-simvastatin (VYTORIN) 10-40 MG tablet TAKE 1 TABLET BY MOUTH AT BEDTIME 90 tablet 3   famotidine (PEPCID) 20 MG tablet Take 1 tablet (20 mg total) by mouth at bedtime. 1 tablet 0   ferrous sulfate 325 (65 FE) MG tablet TAKE 1 TABLET BY MOUTH EVERY DAY WITH BREAKFAST 90 tablet 0   furosemide (LASIX) 20 MG tablet TAKE 1 TABLET BY MOUTH EVERY DAY 90 tablet 0   metoprolol succinate (TOPROL-XL) 50 MG 24 hr tablet TAKE 1 TABLET (50 MG TOTAL) BY MOUTH DAILY. TAKE WITH OR IMMEDIATELY FOLLOWING A MEAL. 90 tablet 3   nitroGLYCERIN (NITROSTAT) 0.4 MG SL tablet Place 1 tablet (0.4 mg total) under the tongue every 5 (five) minutes x 3 doses as needed for chest pain. 30 tablet 3   omega-3 acid ethyl esters (LOVAZA) 1 g capsule Take 1,000 mg by mouth at bedtime. Reported on 06/05/2015     pantoprazole (PROTONIX) 40 MG tablet Take 1 tablet (40 mg total) by mouth daily. NEEDS OFFICE VISIT FOR FURTHER REFILLS 90 tablet 0   rOPINIRole (REQUIP) 3 MG tablet 1/2 tab in AM and 1 full tab at night 135 tablet 0   sacubitril-valsartan (ENTRESTO) 24-26 MG Take 1 tablet by mouth 2 (two) times daily. 180 tablet 3   spironolactone (ALDACTONE) 25 MG tablet Take 0.5 tablets (12.5 mg total) by mouth daily. 15 tablet 6   tamsulosin (FLOMAX) 0.4 MG CAPS capsule TAKE 1 CAPSULE BY MOUTH EVERY DAY 90 capsule 0   No current facility-administered medications for this encounter.    Vitals:   08/21/20 0903  BP: 140/60  Pulse: (!) 57  SpO2: 99%  Weight: 59.3 kg (130 lb 12.8 oz)    PHYSICAL EXAM: General: NAD Neck: No JVD, no thyromegaly or  thyroid nodule.  Lungs: Clear to  auscultation bilaterally with normal respiratory effort. CV: Nondisplaced PMI.  Heart regular S1/S2, no S3/S4, no murmur.  No peripheral edema.  Left carotid bruit.  Weak pedal pulses.  Abdomen: Soft, nontender, no hepatosplenomegaly, no distention.  Skin: Intact without lesions or rashes.  Neurologic: Alert and oriented x 3.  Psych: Normal affect. Extremities: No clubbing or cyanosis.  HEENT: Normal.   ASSESSMENT & PLAN:  1. Chronic Systolic Heart Failure: Ischemic cardiomyopathy, EF 20-25% (08/2013), EF recovered on 2017 echo with EF 55-60%.  Most recent echo in 5/20 with EF 45-50%, septal hypokinesis, moderate AI.  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  Echo in 5/22 with EF 40-45%.  NYHA class I-II symptoms. He is not volume overloaded on exam.  - Continue Lasix 20 mg daily.  - Continue Entresto 24/26 bid.   - Continue Toprol XL 50 mg daily.  - Start spironolactone 12.5 mg daily. BMET today and again in 10 days.  2. CAD: LHC 09/11/13 with 2 vessel disease that required PCI of proximal LAD and proximal RCA.  Cardiolite in 6/20 with no ischemia but fixed apical septal defect.  Rare atypical chest pain.  - Continue Plavix + ASA. Will be on Plavix indefinitely.  - Continue Vytorin, good lipids in 4/22. 3. COPD: Still smoking, I urged him to quit.  4. H/O of bilateral CEAs: Followed by [facility]. - Continue ASA and Plavix.  - Carotid dopplers will be done at [facility].  5. History of aortobiiliac endograft with Dr. Sammuel Hines at [facility]. Follows for PAD with [facility] now. No claudication.  - Sees [facility] regularly.  6. Hyperlipidemia: Good lipids in 4/22 on Vytorin.   Followup in 6 months.   [name] 08/22/2020

## 2020-09-01 ENCOUNTER — Ambulatory Visit (HOSPITAL_COMMUNITY)
Admission: RE | Admit: 2020-09-01 | Discharge: 2020-09-01 | Disposition: A | Discharge status: Home / Self Care | Payer: Medicare Other | Source: Ambulatory Visit | Attending: Internal Medicine | Admitting: Internal Medicine

## 2020-09-01 ENCOUNTER — Other Ambulatory Visit: Payer: Self-pay

## 2020-09-01 DIAGNOSIS — I5022 Chronic systolic (congestive) heart failure: Secondary | ICD-10-CM | POA: Insufficient documentation

## 2020-09-01 LAB — BASIC METABOLIC PANEL
Anion gap: 5 (ref 5–15)
BUN: 14 mg/dL (ref 8–23)
CO2: 28 mmol/L (ref 22–32)
Calcium: 8.9 mg/dL (ref 8.9–10.3)
Chloride: 103 mmol/L (ref 98–111)
Creatinine, Ser: 1.04 mg/dL (ref 0.61–1.24)
GFR, Estimated: >60 mL/min (ref >60)
Glucose, Bld: 98 mg/dL (ref 70–99)
Potassium: 4.3 mmol/L (ref 3.5–5.1)
Sodium: 136 mmol/L (ref 135–145)

## 2020-09-04 ENCOUNTER — Encounter: Payer: Self-pay | Admitting: Family Medicine

## 2020-09-04 ENCOUNTER — Ambulatory Visit (INDEPENDENT_AMBULATORY_CARE_PROVIDER_SITE_OTHER): Payer: Medicare Other | Admitting: Family Medicine

## 2020-09-04 ENCOUNTER — Telehealth: Payer: Self-pay | Admitting: Family Medicine

## 2020-09-04 ENCOUNTER — Other Ambulatory Visit: Payer: Self-pay

## 2020-09-04 ENCOUNTER — Ambulatory Visit (HOSPITAL_COMMUNITY)
Admission: RE | Admit: 2020-09-04 | Discharge: 2020-09-04 | Disposition: A | Discharge status: Home / Self Care | Payer: Medicare Other | Source: Ambulatory Visit | Attending: Family Medicine | Admitting: Family Medicine

## 2020-09-04 VITALS — BP 148/68 | HR 56 | Temp 97.0°F | Ht 66.0 in | Wt 129.4 lb

## 2020-09-04 DIAGNOSIS — N433 Hydrocele, unspecified: Secondary | ICD-10-CM | POA: Diagnosis not present

## 2020-09-04 DIAGNOSIS — R1032 Left lower quadrant pain: Secondary | ICD-10-CM | POA: Diagnosis not present

## 2020-09-04 DIAGNOSIS — I6523 Occlusion and stenosis of bilateral carotid arteries: Secondary | ICD-10-CM

## 2020-09-04 DIAGNOSIS — N50812 Left testicular pain: Secondary | ICD-10-CM | POA: Diagnosis not present

## 2020-09-04 DIAGNOSIS — N5089 Other specified disorders of the male genital organs: Secondary | ICD-10-CM | POA: Insufficient documentation

## 2020-09-04 DIAGNOSIS — N451 Epididymitis: Secondary | ICD-10-CM | POA: Insufficient documentation

## 2020-09-04 NOTE — Telephone Encounter (Signed)
-----   Message from Tammi Sou, [place] sent at 09/04/2020  4:43 PM EDT ----- Pt notified of [place] results and Dr. Marliss Coots comments. Pt does want to see a urologist. Pt has seen Dr. Anitra Lauth with [facility] in the past and would like to be referred back to Dr. Anitra Lauth in possible. I advise pt Dr. Glori Bickers will put referral in and office will f/u to get appt scheduled

## 2020-09-04 NOTE — Progress Notes (Signed)
Subjective:    Patient ID: Erik [name]., male    DOB: 1941/03/08, 80 y.o.   MRN: [id number]  This visit occurred during the SARS-CoV-2 public health emergency.  Safety protocols were in place, including screening questions prior to the visit, additional usage of staff PPE, and extensive cleaning of exam room while observing appropriate contact time as indicated for disinfecting solutions.   HPI 80 yo pt of Dr [name] presents with area of swelling in his groin   Wt Readings from Last 3 Encounters:  09/04/20 129 lb 6 oz (58.7 kg)  08/21/20 130 lb 12.8 oz (59.3 kg)  07/10/20 129 lb 8 oz (58.7 kg)   20.88 kg/m  For a few years- his L testicle has been larger  For a few weeks- is a little bigger and a little tender   No redness or skin change  Some sharp pain in L pelvic area -on and off   He had a hernia repaired 23 y ago on the left  Has always been nagging pain there since   Feels ok  No fever or malaise or urinary symptoms   Patient Active Problem List   Diagnosis Date Noted   Testicular swelling, left 09/04/2020   Left groin pain 09/04/2020   Bronchiectasis ([place]) 10/24/2019   Restless leg syndrome 05/30/2018   Abdominal aortic aneurysm (AAA) ([place]) 07/07/2015   Arteriosclerosis of coronary artery 07/07/2015   Cardiomyopathy, ischemic 07/07/2015   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   Solitary pulmonary nodule 04/16/2014   Counseling regarding end of life decision making 04/16/2014   HTN (hypertension) 10/03/2013   CAD S/P percutaneous coronary angioplasty 52/77/8242   Chronic systolic heart failure ([place]) 09/19/2013   NSTEMI (non-ST elevated myocardial infarction) ([place]) 09/10/2013   Family history of coronary artery disease in brother 09/10/2013   ED (erectile dysfunction) 12/10/2011   COPD, mild ([place]) 06/20/2008   COLONIC POLYPS, ADENOMATOUS 06/05/2008   BPH (benign prostatic hyperplasia) 06/05/2008   Prediabetes 05/14/2008    VENEREAL WART 05/07/2008   ONYCHOMYCOSIS, TOENAILS 05/07/2008   Hyperlipidemia 05/07/2008   Bilateral carotid artery stenosis 05/07/2008   INTERMITTENT CLAUDICATION 05/07/2008   HEART MURMUR, HX OF 05/07/2008   COLONIC POLYPS, HX OF 05/07/2008   Past Medical History:  Diagnosis Date   Adenomatous colon polyp    Aortic aneurysm (HCC)    BPH (benign prostatic hypertrophy)    CAD (coronary artery disease)    Carotid artery stenosis    a. Bilateral CEA   Cataract    CHF (congestive heart failure) (HCC)    Chronic systolic heart failure (HCC)    a. EF 20-25%, mild LVH, mod HK, mid apicalanteroseptal myocardium, mild MR, LA mod dilated   Collagen vascular disease (HCC)    COPD (chronic obstructive pulmonary disease) ([place])    Coronary artery disease    a. LHC (08/2013): Lmain: short 30% distal, LAD: sml D1 & D2, 70% ostial D1, 95-99% LAD stenosis prox D2 LCx: sml/mod ramus subtot. occluded, 40% ostial set off lg OM1, 40% AV LCx after OM1, RCA: 90% prox (DES to RCA and prox LAD)   Diverticulosis    GERD (gastroesophageal reflux disease)    Heart murmur    History of colonic polyps    Hyperlipidemia    Hyperplastic colon polyp    Hypertension    Ischemic cardiomyopathy    RLS (restless legs syndrome)    Past Surgical History:  Procedure Laterality Date   cardiac stents  09-2013  CAROTID ENDARTERECTOMY  04/17/2008   right   CAROTID ENDARTERECTOMY  05/30/08   Left   CATARACT EXTRACTION W/PHACO Right 10/05/2018   Procedure: CATARACT EXTRACTION PHACO AND INTRAOCULAR LENS PLACEMENT ([place]), RIGHT;  Surgeon: Birder Robson, MD;  Location: [facility] ORS;  Service: Ophthalmology;  Laterality: Right;  [place] 01:06.4 cde 12.79 Fluid Pack Lot # [id number]   CATARACT EXTRACTION W/PHACO Left 11/02/2018   Procedure: CATARACT EXTRACTION PHACO AND INTRAOCULAR LENS PLACEMENT ([place]), LEFT;  Surgeon: Birder Robson, MD;  Location: [facility] ORS;  Service: Ophthalmology;  Laterality: Left;  [place]  01:10 CDE  11.52 Fluid pack lot # [id number]   CHOLECYSTECTOMY     Gall Bladder   CORONARY ANGIOGRAM  09/11/2013   Procedure: CORONARY ANGIOGRAM;  Surgeon: Troy Sine, MD;  Location: [facility] CATH LAB;  Service: Cardiovascular;;   CORONARY ANGIOPLASTY     STENTS   HERNIA REPAIR     lower aorta aneurysm  05/26/2015   [facility]   PERCUTANEOUS STENT INTERVENTION  09/11/2013   Procedure: PERCUTANEOUS STENT INTERVENTION;  Surgeon: Troy Sine, MD;  Location: [place] CATH LAB;  Service: Cardiovascular;;  DES Prox RCA 3.5x15 xience    Social History   Tobacco Use   Smoking status: Every Day    Packs/day: 1.00    Years: 50.00    Pack years: 50.00    Types: Cigarettes   Smokeless tobacco: Former    Quit date: 09/10/2013   Tobacco comments:    off and on since age 80  Vaping Use   Vaping Use: Never used  Substance Use Topics   Alcohol use: No   Drug use: No   Family History  Problem Relation Age of Onset   Heart disease Brother    Hyperlipidemia Brother    Pancreatic cancer Brother    Hypertension Mother    Cancer Father        unknown type; sounds GI   Emphysema Father    Hyperlipidemia Brother    Hyperlipidemia Brother    Heart attack Brother    Hyperlipidemia Brother    Multiple sclerosis Brother    Kidney disease Neg Hx    Prostate cancer Neg Hx    Colon cancer Neg Hx    Esophageal cancer Neg Hx    Rectal cancer Neg Hx    Stomach cancer Neg Hx    Allergies  Allergen Reactions   Codeine Swelling    throat swells   Current Outpatient Medications on File Prior to Visit  Medication Sig Dispense Refill   acetaminophen (TYLENOL) 325 MG tablet Take 650 mg by mouth every 6 (six) hours as needed (for pain.).      aspirin EC 81 MG tablet Take 81 mg by mouth every evening.      clopidogrel (PLAVIX) 75 MG tablet Take 1 tablet (75 mg total) by mouth daily. 30 tablet 2   ezetimibe-simvastatin (VYTORIN) 10-40 MG tablet TAKE 1 TABLET BY MOUTH AT BEDTIME 90 tablet 3   famotidine (PEPCID) 20 MG tablet  Take 1 tablet (20 mg total) by mouth at bedtime. 1 tablet 0   ferrous sulfate 325 (65 FE) MG tablet TAKE 1 TABLET BY MOUTH EVERY DAY WITH BREAKFAST 90 tablet 0   furosemide (LASIX) 20 MG tablet TAKE 1 TABLET BY MOUTH EVERY DAY 90 tablet 0   metoprolol succinate (TOPROL-XL) 50 MG 24 hr tablet TAKE 1 TABLET (50 MG TOTAL) BY MOUTH DAILY. TAKE WITH OR IMMEDIATELY FOLLOWING A MEAL. 90 tablet 3   nitroGLYCERIN (NITROSTAT)  0.4 MG SL tablet Place 1 tablet (0.4 mg total) under the tongue every 5 (five) minutes x 3 doses as needed for chest pain. 30 tablet 3   omega-3 acid ethyl esters (LOVAZA) 1 g capsule Take 1,000 mg by mouth at bedtime. Reported on 06/05/2015     pantoprazole (PROTONIX) 40 MG tablet Take 1 tablet (40 mg total) by mouth daily. NEEDS OFFICE VISIT FOR FURTHER REFILLS 90 tablet 0   rOPINIRole (REQUIP) 3 MG tablet 1/2 tab in AM and 1 full tab at night 135 tablet 0   sacubitril-valsartan (ENTRESTO) 24-26 MG Take 1 tablet by mouth 2 (two) times daily. 180 tablet 3   spironolactone (ALDACTONE) 25 MG tablet Take 0.5 tablets (12.5 mg total) by mouth daily. 15 tablet 6   tamsulosin (FLOMAX) 0.4 MG CAPS capsule TAKE 1 CAPSULE BY MOUTH EVERY DAY 90 capsule 0   No current facility-administered medications on file prior to visit.    Review of Systems  Constitutional:  Negative for activity change, appetite change, fatigue, fever and unexpected weight change.  HENT:  Negative for congestion, rhinorrhea, sore throat and trouble swallowing.   Eyes:  Negative for pain, redness, itching and visual disturbance.  Respiratory:  Negative for cough, chest tightness, shortness of breath and wheezing.   Cardiovascular:  Negative for chest pain and palpitations.  Gastrointestinal:  Negative for abdominal pain, blood in stool, constipation, diarrhea and nausea.  Endocrine: Negative for cold intolerance, heat intolerance, polydipsia and polyuria.  Genitourinary:  Positive for scrotal swelling and testicular pain.  Negative for decreased urine volume, difficulty urinating, dysuria, flank pain, frequency, genital sores, hematuria, penile discharge and urgency.  Musculoskeletal:  Negative for arthralgias, joint swelling and myalgias.  Skin:  Negative for pallor and rash.  Neurological:  Negative for dizziness, tremors, weakness, numbness and headaches.  Hematological:  Negative for adenopathy. Does not bruise/bleed easily.  Psychiatric/Behavioral:  Negative for decreased concentration and dysphoric mood. The patient is not nervous/anxious.       Objective:   Physical Exam Exam conducted with a chaperone present.  Constitutional:      Appearance: Normal appearance. He is normal weight.  HENT:     Head: Normocephalic and atraumatic.  Eyes:     Conjunctiva/sclera: Conjunctivae normal.     Pupils: Pupils are equal, round, and reactive to light.  Cardiovascular:     Rate and Rhythm: Regular rhythm. Bradycardia present.  Pulmonary:     Effort: Pulmonary effort is normal. No respiratory distress.     Breath sounds: Normal breath sounds. No wheezing.  Genitourinary:    Pubic Area: No rash.      Penis: Normal.      Testes: Cremasteric reflex is present.        Left: Tenderness and swelling present.     Comments: Left testicle is larger that the right  Mildly tender  No mass or warmth or skin change   Inguinal canal on the L feels more full than the right    Musculoskeletal:     Cervical back: Neck supple.     Right lower leg: No edema.     Left lower leg: No edema.  Skin:    Coloration: Skin is not pale.     Findings: No erythema or rash.  Neurological:     Mental Status: He is alert.  Psychiatric:        Mood and Affect: Affect is blunt.     Comments: Quiet Answers questions appropriately  Assessment & Plan:   Problem List Items Addressed This Visit       Other   Testicular swelling, left - Primary    Per pt for 2 y but recently worse  Remote h/o inguinal hernia  repair on that side  Some tenderness on exam but not severe Reports intermittent groin pain on L also not rep with hip rotation or valsalva  Urgent scrotal US ordered inst to call or go to ER if suddenly severe        Relevant Orders   US SCROTUM W/DOPPLER   Left groin pain    In the setting of swollen testicle on L (acute on chronic from hx) and now some intermittent L groin pain  Pain is not reproduced with hip rotation or valsalva Testicular US ordered  Past h/o hernia that was repaired        Relevant Orders   US SCROTUM W/DOPPLER

## 2020-09-04 NOTE — Assessment & Plan Note (Signed)
Per pt for 2 y but recently worse  Remote h/o inguinal hernia repair on that side  Some tenderness on exam but not severe Reports intermittent groin pain on L also not rep with hip rotation or valsalva  Urgent scrotal US ordered inst to call or go to ER if suddenly severe

## 2020-09-04 NOTE — Patient Instructions (Signed)
Let's check an ultrasound of the testicle and scrotum  If symptoms suddenly worsen let me know  Try to avoid straining and heavy lifting

## 2020-09-04 NOTE — Assessment & Plan Note (Signed)
In the setting of swollen testicle on L (acute on chronic from hx) and now some intermittent L groin pain  Pain is not reproduced with hip rotation or valsalva Testicular US ordered  Past h/o hernia that was repaired

## 2020-09-04 NOTE — Telephone Encounter (Signed)
Referral done

## 2020-09-05 ENCOUNTER — Other Ambulatory Visit: Payer: Self-pay | Admitting: Family Medicine

## 2020-09-10 ENCOUNTER — Ambulatory Visit (INDEPENDENT_AMBULATORY_CARE_PROVIDER_SITE_OTHER): Payer: Medicare Other | Admitting: Urology

## 2020-09-10 ENCOUNTER — Encounter: Payer: Self-pay | Admitting: Urology

## 2020-09-10 ENCOUNTER — Other Ambulatory Visit: Payer: Self-pay

## 2020-09-10 VITALS — BP 153/66 | HR 62 | Ht 66.0 in | Wt 125.0 lb

## 2020-09-10 DIAGNOSIS — N433 Hydrocele, unspecified: Secondary | ICD-10-CM | POA: Diagnosis not present

## 2020-09-10 DIAGNOSIS — N5089 Other specified disorders of the male genital organs: Secondary | ICD-10-CM | POA: Diagnosis not present

## 2020-09-11 LAB — URINALYSIS, COMPLETE
Bilirubin, UA: NEGATIVE
Glucose, UA: NEGATIVE
Ketones, UA: NEGATIVE
Leukocytes,UA: NEGATIVE
Nitrite, UA: NEGATIVE
Protein,UA: NEGATIVE
RBC, UA: NEGATIVE
Specific Gravity, UA: 1.015 (ref 1.005–1.030)
Urobilinogen, Ur: 0.2 mg/dL (ref 0.2–1.0)
pH, UA: 7 (ref 5.0–7.5)

## 2020-09-11 LAB — MICROSCOPIC EXAMINATION
Bacteria, UA: NONE SEEN
Epithelial Cells (non renal): NONE SEEN /hpf (ref 0–10)
RBC, Urine: NONE SEEN /hpf (ref 0–2)

## 2020-09-12 ENCOUNTER — Encounter: Payer: Self-pay | Admitting: Urology

## 2020-09-12 NOTE — Progress Notes (Signed)
09/10/2020 6:28 AM   Erik [name]. [birth year] [id number]  Referring provider: Abner Greenspan, MD [address] [place],  [place] [address]  Chief Complaint  Patient presents with   Hydrocele    HPI: Erik [name] is a 80 y.o. male referred for evaluation of left hydrocele.  1-2 year history of left hemiscrotal swelling Over last 2 weeks has noted increasing size Mild intermittent discomfort Chronic left groin pain after hernia repair 22 years ago Saw Dr. Glori Bickers and a scrotal ultrasound was ordered which showed normal-appearing testes bilaterally, mild enlargement of the left epididymis without mass and a large left hydrocele States pain improved since PCP visit   PMH: Past Medical History:  Diagnosis Date   Adenomatous colon polyp    Aortic aneurysm (HCC)    BPH (benign prostatic hypertrophy)    CAD (coronary artery disease)    Carotid artery stenosis    a. Bilateral CEA   Cataract    CHF (congestive heart failure) (HCC)    Chronic systolic heart failure (HCC)    a. EF 20-25%, mild LVH, mod HK, mid apicalanteroseptal myocardium, mild MR, LA mod dilated   Collagen vascular disease (HCC)    COPD (chronic obstructive pulmonary disease) ([place])    Coronary artery disease    a. LHC (08/2013): [place]: short 30% distal, LAD: sml D1 & D2, 70% ostial D1, 95-99% LAD stenosis prox D2 LCx: sml/mod ramus subtot. occluded, 40% ostial set off lg OM1, 40% AV LCx after OM1, RCA: 90% prox (DES to RCA and prox LAD)   Diverticulosis    GERD (gastroesophageal reflux disease)    Heart murmur    History of colonic polyps    Hyperlipidemia    Hyperplastic colon polyp    Hypertension    Ischemic cardiomyopathy    RLS (restless legs syndrome)     Surgical History: Past Surgical History:  Procedure Laterality Date   cardiac stents  09-2013   CAROTID ENDARTERECTOMY  04/17/2008   right   CAROTID ENDARTERECTOMY  05/30/08   Left   CATARACT EXTRACTION W/PHACO Right 10/05/2018    Procedure: CATARACT EXTRACTION PHACO AND INTRAOCULAR LENS PLACEMENT ([place]), RIGHT;  Surgeon: Birder Robson, MD;  Location: [facility] ORS;  Service: Ophthalmology;  Laterality: Right;  [place] 01:06.4 cde 12.79 Fluid Pack Lot # [id number]   CATARACT EXTRACTION W/PHACO Left 11/02/2018   Procedure: CATARACT EXTRACTION PHACO AND INTRAOCULAR LENS PLACEMENT ([place]), LEFT;  Surgeon: Birder Robson, MD;  Location: [facility] ORS;  Service: Ophthalmology;  Laterality: Left;  [place]  01:10 CDE 11.52 Fluid pack lot # [id number]   CHOLECYSTECTOMY     Gall Bladder   CORONARY ANGIOGRAM  09/11/2013   Procedure: CORONARY ANGIOGRAM;  Surgeon: Troy Sine, MD;  Location: [facility] CATH LAB;  Service: Cardiovascular;;   CORONARY ANGIOPLASTY     STENTS   HERNIA REPAIR     lower aorta aneurysm  05/26/2015   [facility]   PERCUTANEOUS STENT INTERVENTION  09/11/2013   Procedure: PERCUTANEOUS STENT INTERVENTION;  Surgeon: Troy Sine, MD;  Location: [facility] CATH LAB;  Service: Cardiovascular;;  DES Prox RCA 3.5x15 xience     Home Medications:  Allergies as of 09/10/2020       Reactions   Codeine Swelling   throat swells        Medication List        Accurate as of September 10, 2020 11:59 PM. If you have any questions, ask your nurse or doctor.  acetaminophen 325 MG tablet Commonly known as: TYLENOL Take 650 mg by mouth every 6 (six) hours as needed (for pain.).   aspirin EC 81 MG tablet Take 81 mg by mouth every evening.   clopidogrel 75 MG tablet Commonly known as: PLAVIX Take 1 tablet (75 mg total) by mouth daily.   Entresto 24-26 MG Generic drug: sacubitril-valsartan Take 1 tablet by mouth 2 (two) times daily.   ezetimibe-simvastatin 10-40 MG tablet Commonly known as: VYTORIN TAKE 1 TABLET BY MOUTH AT BEDTIME   famotidine 20 MG tablet Commonly known as: PEPCID Take 1 tablet (20 mg total) by mouth at bedtime.   ferrous sulfate 325 (65 FE) MG tablet TAKE 1 TABLET BY MOUTH EVERY DAY WITH BREAKFAST    furosemide 20 MG tablet Commonly known as: LASIX TAKE 1 TABLET BY MOUTH EVERY DAY   metoprolol succinate 50 MG 24 hr tablet Commonly known as: TOPROL-XL TAKE 1 TABLET (50 MG TOTAL) BY MOUTH DAILY. TAKE WITH OR IMMEDIATELY FOLLOWING A MEAL.   nitroGLYCERIN 0.4 MG SL tablet Commonly known as: NITROSTAT Place 1 tablet (0.4 mg total) under the tongue every 5 (five) minutes x 3 doses as needed for chest pain.   omega-3 acid ethyl esters 1 g capsule Commonly known as: LOVAZA Take 1,000 mg by mouth at bedtime. Reported on 06/05/2015   pantoprazole 40 MG tablet Commonly known as: PROTONIX Take 1 tablet (40 mg total) by mouth daily. NEEDS OFFICE VISIT FOR FURTHER REFILLS   rOPINIRole 3 MG tablet Commonly known as: REQUIP 1/2 tab in AM and 1 full tab at night   spironolactone 25 MG tablet Commonly known as: ALDACTONE Take 0.5 tablets (12.5 mg total) by mouth daily.   tamsulosin 0.4 MG Caps capsule Commonly known as: FLOMAX TAKE 1 CAPSULE BY MOUTH EVERY DAY        Allergies:  Allergies  Allergen Reactions   Codeine Swelling    throat swells    Family History: Family History  Problem Relation Age of Onset   Heart disease Brother    Hyperlipidemia Brother    Pancreatic cancer Brother    Hypertension Mother    Cancer Father        unknown type; sounds GI   Emphysema Father    Hyperlipidemia Brother    Hyperlipidemia Brother    Heart attack Brother    Hyperlipidemia Brother    Multiple sclerosis Brother    Kidney disease Neg Hx    Prostate cancer Neg Hx    Colon cancer Neg Hx    Esophageal cancer Neg Hx    Rectal cancer Neg Hx    Stomach cancer Neg Hx     Social History:  reports that he has been smoking cigarettes. He has a 50.00 pack-year smoking history. He quit smokeless tobacco use about 7 years ago. He reports that he does not drink alcohol and does not use drugs.   Physical Exam: BP (!) 153/66   Pulse 62   Ht 5\' 6"  (1.676 m)   Wt 125 lb (56.7 kg)    BMI 20.18 kg/m   Constitutional:  Alert and oriented, No acute distress. HEENT: [place] AT, moist mucus membranes.  Trachea midline, no masses. Cardiovascular: No clubbing, cyanosis, or edema. Respiratory: Normal respiratory effort, no increased work of breathing. GI: Abdomen is soft, nontender, nondistended, no abdominal masses GU: Phallus without lesions, moderate left hydrocele which is soft and testis is palpable.  Mild enlargement left epididymis without discrete mass or tenderness.  Right  testis descended and palpably normal Skin: No rashes, bruises or suspicious lesions. Neurologic: Grossly intact, no focal deficits, moving all 4 extremities. Psychiatric: Normal mood and affect.  Laboratory Data:  Urinalysis Dipstick/microscopy negative   Assessment & Plan:    1.  Left hydrocele Minimally symptomatic Reassured there is no evidence of cancer Options were discussed including observation, hydrocelectomy and aspiration with or without sclerotherapy He desires observation and will follow-up as needed Mild prominence of left epididymis without tenderness noted.  No discrete mass on ultrasound   Abbie Sons, MD  [facility] [address], [place], [place] [address] [phone]

## 2020-09-30 ENCOUNTER — Other Ambulatory Visit (HOSPITAL_COMMUNITY): Payer: Self-pay | Admitting: Cardiology

## 2020-10-06 ENCOUNTER — Other Ambulatory Visit: Payer: Self-pay | Admitting: Internal Medicine

## 2020-10-06 ENCOUNTER — Other Ambulatory Visit: Payer: Self-pay | Admitting: Family Medicine

## 2020-10-06 ENCOUNTER — Other Ambulatory Visit (HOSPITAL_COMMUNITY): Payer: Self-pay | Admitting: Cardiology

## 2020-10-10 ENCOUNTER — Telehealth: Payer: Self-pay | Admitting: Family Medicine

## 2020-10-10 NOTE — Chronic Care Management (AMB) (Signed)
  Chronic Care Management   Note  10/10/2020 Name: Amillion Helderman. MRN: [id number] DOB: 07-Jul-1940  [name] is a 80 y.o. year old male who is a primary care patient of Bedsole, Amy E, MD. I reached out to [employer]. by phone today in response to a referral sent by Mr. Jasper Riling Jr.'s PCP, [name], MD.   Mr. Creson was given information about Chronic Care Management services today including:  CCM service includes personalized support from designated clinical staff supervised by his physician, including individualized plan of care and coordination with other care providers 24/7 contact phone numbers for assistance for urgent and routine care needs. Service will only be billed when office clinical staff spend 20 minutes or more in a month to coordinate care. Only one practitioner may furnish and bill the service in a calendar month. The patient may stop CCM services at any time (effective at the end of the month) by phone call to the office staff.   Patient agreed to services and verbal consent obtained.   Follow up plan:   [place]ana Secretary/administrator

## 2020-10-21 ENCOUNTER — Telehealth: Payer: Self-pay | Admitting: Pharmacist

## 2020-10-21 NOTE — Progress Notes (Signed)
Chronic Care Management Pharmacy Assistant   Name: Erik [name].  MRN: [id number] DOB: March 08, 1941  Erik [name]. is an 80 y.o. year old male who presents for his initial CCM visit with the clinical pharmacist.  Reason for Encounter: Initial Questions for CPP    Recent office visits:  06/20/20 Dr. [name] (PCP)  Reason: Wellness Exam Medication changes:Try higher dose of ropirole at night for better restless leg control-rOPINIRole HCl 3 MG 1/2 tab in AM and 1 full tab  09/04/20 Dr. Loura Pardon, Family Medicine Reason:Swollen Groin Area Orders:Testicular US  Recent consult visits:  09/10/20 Dr. John Giovanni, Urology Reason:Left Hydrocele Orders:urinalysis, complete and microscopic examination  Hospital visits:  None in previous 6 months  Medications: Outpatient Encounter Medications as of 10/21/2020  Medication Sig Note   acetaminophen (TYLENOL) 325 MG tablet Take 650 mg by mouth every 6 (six) hours as needed (for pain.).     aspirin EC 81 MG tablet Take 81 mg by mouth every evening.     clopidogrel (PLAVIX) 75 MG tablet Take 1 tablet (75 mg total) by mouth daily.    ezetimibe-simvastatin (VYTORIN) 10-40 MG tablet TAKE 1 TABLET BY MOUTH AT BEDTIME    famotidine (PEPCID) 20 MG tablet Take 1 tablet (20 mg total) by mouth at bedtime.    ferrous sulfate 325 (65 FE) MG tablet TAKE 1 TABLET BY MOUTH EVERY DAY WITH BREAKFAST    furosemide (LASIX) 20 MG tablet TAKE 1 TABLET BY MOUTH EVERY DAY    metoprolol succinate (TOPROL-XL) 50 MG 24 hr tablet TAKE 1 TABLET (50 MG TOTAL) BY MOUTH DAILY. TAKE WITH OR IMMEDIATELY FOLLOWING A MEAL.    nitroGLYCERIN (NITROSTAT) 0.4 MG SL tablet Place 1 tablet (0.4 mg total) under the tongue every 5 (five) minutes x 3 doses as needed for chest pain. 06/10/2020: .   omega-3 acid ethyl esters (LOVAZA) 1 g capsule Take 1,000 mg by mouth at bedtime. Reported on 06/05/2015    pantoprazole (PROTONIX) 40 MG tablet Take 1 tablet (40 mg total) by  mouth daily. NEEDS OFFICE VISIT FOR FURTHER REFILLS    rOPINIRole (REQUIP) 3 MG tablet TAKE 1/2 TABLET BY MOUTH IN THE MORNING AND 1 TABLET AT NIGHT    sacubitril-valsartan (ENTRESTO) 24-26 MG Take 1 tablet by mouth 2 (two) times daily.    spironolactone (ALDACTONE) 25 MG tablet Take 0.5 tablets (12.5 mg total) by mouth daily.    tamsulosin (FLOMAX) 0.4 MG CAPS capsule TAKE 1 CAPSULE BY MOUTH EVERY DAY    No facility-administered encounter medications on file as of 10/21/2020.    Pharmacist Review Have you seen any other providers since your last visit? Patient states that he has not seen any other provider  Any changes in your medications or health? Patient states that he has not had any changes in his medication or health  Any side effects from any medications? Patient states that he does not have any side effects from medications  Do you have an symptoms or problems not managed by your medications? Patient states that he does not have any symptoms or problems not managed by medications  Any concerns about your health right now? Patient states that he does not have any concerns about his health or medications at this time   Has your provider asked that you check blood pressure, blood sugar, or follow special diet at home? Patient does not check blood pressure, blood sugar or follow a special diet  Do you  get any type of exercise on a regular basis? Patient states that he does not exercise  Can you think of a goal you would like to reach for your health? Patient states that he does not have a goal just would like to try and stay healthy as he can  Do you have any problems getting your medications? Patent states that he does not have any problems getting his medications or the cost of medications from the pharmacy  Is there anything that you would like to discuss during the appointment? Patient states that he dos not have anything specific to discuss  Please bring medications and  supplements to appointment   Star Rating Drugs: Sacubitril-valsartan 24-16 mg last fill 10/17/20 30 ds   [place] Pharmacist Assistant [phone]   Time spent:37

## 2020-10-25 ENCOUNTER — Other Ambulatory Visit (HOSPITAL_COMMUNITY): Payer: Self-pay | Admitting: Cardiology

## 2020-10-26 ENCOUNTER — Other Ambulatory Visit: Payer: Self-pay | Admitting: Internal Medicine

## 2020-10-29 ENCOUNTER — Ambulatory Visit (INDEPENDENT_AMBULATORY_CARE_PROVIDER_SITE_OTHER): Payer: Medicare Other | Admitting: Pharmacist

## 2020-10-29 ENCOUNTER — Other Ambulatory Visit: Payer: Self-pay

## 2020-10-29 DIAGNOSIS — N401 Enlarged prostate with lower urinary tract symptoms: Secondary | ICD-10-CM | POA: Diagnosis not present

## 2020-10-29 DIAGNOSIS — I5022 Chronic systolic (congestive) heart failure: Secondary | ICD-10-CM

## 2020-10-29 DIAGNOSIS — I10 Essential (primary) hypertension: Secondary | ICD-10-CM

## 2020-10-29 DIAGNOSIS — I2510 Atherosclerotic heart disease of native coronary artery without angina pectoris: Secondary | ICD-10-CM

## 2020-10-29 DIAGNOSIS — N138 Other obstructive and reflux uropathy: Secondary | ICD-10-CM

## 2020-10-29 DIAGNOSIS — K219 Gastro-esophageal reflux disease without esophagitis: Secondary | ICD-10-CM

## 2020-10-29 DIAGNOSIS — G2581 Restless legs syndrome: Secondary | ICD-10-CM

## 2020-10-29 DIAGNOSIS — Z9861 Coronary angioplasty status: Secondary | ICD-10-CM | POA: Diagnosis not present

## 2020-10-29 DIAGNOSIS — E785 Hyperlipidemia, unspecified: Secondary | ICD-10-CM

## 2020-10-29 DIAGNOSIS — Z720 Tobacco use: Secondary | ICD-10-CM

## 2020-10-29 NOTE — Progress Notes (Signed)
Chronic Care Management Pharmacy Note  10/29/2020 Name:  Erik [name]. MRN:  [id number] DOB:  1940-10-04  Summary: -Pt reports RLS is worsening somewhat; may be interested in changing therapy at some point but not right now -Pt has been off of pantoprazole for past 2 weeks while awaiting Rx renewal and reports no difference in stomach issues  Recommendations/Changes made from today's visit: -Consider changing ropinirole to gabapentin if RLS continues to worsen - pt to call PCP if he is interested in trying this -Can change pantoprazole to PRN use   Subjective: Erik [name]. is an 80 y.o. year old male who is a primary patient of Bedsole, Amy E, MD.  The CCM team was consulted for assistance with disease management and care coordination needs.    Engaged with patient face to face for initial visit in response to provider referral for pharmacy case management and/or care coordination services.   Consent to Services:  The patient was given the following information about Chronic Care Management services today, agreed to services, and gave verbal consent: 1. CCM service includes personalized support from designated clinical staff supervised by the primary care provider, including individualized plan of care and coordination with other care providers 2. 24/7 contact phone numbers for assistance for urgent and routine care needs. 3. Service will only be billed when office clinical staff spend 20 minutes or more in a month to coordinate care. 4. Only one practitioner may furnish and bill the service in a calendar month. 5.The patient may stop CCM services at any time (effective at the end of the month) by phone call to the office staff. 6. The patient will be responsible for cost sharing (co-pay) of up to 20% of the service fee (after annual deductible is met). Patient agreed to services and consent obtained.  Patient Care Team: Excell Seltzer, MD as PCP - General (Family  Medicine) Laurey Morale, MD as PCP - Cardiology (Cardiology) Kathyrn Sheriff, [facility] as Pharmacist (Pharmacist)  Recent office visits: 09/04/20 Dr. Roxy Manns, Family Medicine Reason:Swollen Groin Area Orders:Testicular US  06/20/20 Dr. [name] (PCP)  Reason: Wellness Exam Medication changes:Try higher dose of ropirole at night for better restless leg control-rOPINIRole HCl 3 MG 1/2 tab in AM and 1 full tab  Recent consult visits: 09/10/20 Dr. Irineo Axon, Urology Reason:Left Hydrocele Orders:urinalysis, complete and microscopic examination   08/21/20 Dr Shirlee Latch (cardiology): f/u CHF. Start spironolactone 12.5 mg daily, repeat BMP 10 days. F/U 6 months  06/10/20 Dr Shirlee Latch (cardiology): f/u CHF, smoking. Stop lisinopril and started Entresto 24-26 mg BID and bupropion 150 mg (smoking).  Hospital visits: None in previous 6 months   Objective:  Lab Results  Component Value Date   CREATININE 1.04 09/01/2020   BUN 14 09/01/2020   GFR 62.68 07/03/2020   GFRNONAA >60 09/01/2020   GFRAA 95 08/18/2018   NA 136 09/01/2020   K 4.3 09/01/2020   CALCIUM 8.9 09/01/2020   CO2 28 09/01/2020   GLUCOSE 98 09/01/2020    Lab Results  Component Value Date/Time   HGBA1C 5.6 07/03/2020 09:06 AM   HGBA1C 5.8 05/31/2019 09:09 AM   GFR 62.68 07/03/2020 09:06 AM   GFR 73.98 05/31/2019 09:09 AM    Last diabetic Eye exam: No results found for: HMDIABEYEEXA  Last diabetic Foot exam: No results found for: HMDIABFOOTEX   Lab Results  Component Value Date   CHOL 100 07/03/2020   HDL 45.60 07/03/2020   LDLCALC 37  07/03/2020   LDLDIRECT 176.9 05/09/2008   TRIG 86.0 07/03/2020   CHOLHDL 2 07/03/2020    Hepatic Function Latest Ref Rng & Units 07/03/2020 05/31/2019 07/25/2018  Total Protein 6.0 - 8.3 g/dL 7.5 6.7 7.2  Albumin 3.5 - 5.2 g/dL 3.8 3.7 3.8  AST 0 - 37 U/L $Remo'12 11 12  'mOKeJ$ ALT 0 - 53 U/L $Remo'9 8 10  'VDgoj$ Alk Phosphatase 39 - 117 U/L 66 69 69  Total Bilirubin 0.2 - 1.2 mg/dL 0.5 0.4 0.6   Bilirubin, Direct 0.0 - 0.3 mg/dL - - -    Lab Results  Component Value Date/Time   TSH 1.178 02/05/2015 05:10 PM    CBC Latest Ref Rng & Units 06/10/2020 09/10/2019 05/01/2019  WBC 4.0 - 10.5 K/uL 7.5 7.9 11.9(H)  Hemoglobin 13.0 - 17.0 g/dL 12.1(L) 11.7(L) 13.1  Hematocrit 39.0 - 52.0 % 37.6(L) 36.3(L) 40.0  Platelets 150 - 400 K/uL 263 234 280.0    No results found for: VD25OH  Clinical ASCVD: Yes  The ASCVD Risk score Mikey Bussing DC Jr., et al., 2013) failed to calculate for the following reasons:   The patient has a prior MI or stroke diagnosis    Depression screen [facility] 2/9 07/10/2020 05/31/2019 05/30/2018  Decreased Interest 0 0 0  Down, Depressed, Hopeless 0 0 0  PHQ - 2 Score 0 0 0  Altered sleeping - 0 -  Tired, decreased energy - 0 -  Change in appetite - 0 -  Feeling bad or failure about yourself  - 0 -  Trouble concentrating - 0 -  Moving slowly or fidgety/restless - 0 -  Suicidal thoughts - 0 -  PHQ-9 Score - 0 -  Difficult doing work/chores - Not difficult at all -  Some recent data might be hidden      Social History   Tobacco Use  Smoking Status Every Day   Packs/day: 1.00   Years: 50.00   Pack years: 50.00   Types: Cigarettes  Smokeless Tobacco Former   Quit date: 09/10/2013  Tobacco Comments   off and on since age 78   BP Readings from Last 3 Encounters:  09/10/20 (!) 153/66  09/04/20 (!) 148/68  08/21/20 140/60   Pulse Readings from Last 3 Encounters:  09/10/20 62  09/04/20 (!) 56  08/21/20 (!) 57   Wt Readings from Last 3 Encounters:  09/10/20 125 lb (56.7 kg)  09/04/20 129 lb 6 oz (58.7 kg)  08/21/20 130 lb 12.8 oz (59.3 kg)   BMI Readings from Last 3 Encounters:  09/10/20 20.18 kg/m  09/04/20 20.88 kg/m  08/21/20 21.11 kg/m    Assessment/Interventions: Review of patient past medical history, allergies, medications, health status, including review of consultants reports, laboratory and other test data, was performed as part of  comprehensive evaluation and provision of chronic care management services.   SDOH:  (Social Determinants of Health) assessments and interventions performed: Yes  SDOH Screenings   Alcohol Screen: Not on file  Depression (PHQ2-9): Low Risk    PHQ-2 Score: 0  Financial Resource Strain: Not on file  Food Insecurity: Not on file  Housing: Not on file  Physical Activity: Not on file  Social Connections: Not on file  Stress: Not on file  Tobacco Use: High Risk   Smoking Tobacco Use: Every Day   Smokeless Tobacco Use: Former  Transportation Needs: Not on file    [place]  Allergies  Allergen Reactions   Codeine Swelling    throat swells  Medications Reviewed Today     Reviewed by Charlton Haws, [facility] (Pharmacist) on 10/29/20 at 1153  Med List Status: <None>   Medication Order Taking? Sig Documenting Provider Last Dose Status Informant  acetaminophen (TYLENOL) 500 MG tablet [id number] Yes Take 500 mg by mouth every 6 (six) hours as needed (for pain.). [provider] Taking Active Self           Med Note Kenton Kingfisher, Enrique Sack T   Tue Oct 03, 2018 10:05 AM)    aspirin EC 81 MG tablet [id number] Yes Take 81 mg by mouth every evening.  [provider] Taking Active Self  clopidogrel (PLAVIX) 75 MG tablet [id number] Yes TAKE 1 TABLET BY MOUTH EVERY DAY Larey Dresser, MD Taking Active   ezetimibe-simvastatin (VYTORIN) 10-40 MG tablet [id number] Yes TAKE 1 TABLET BY MOUTH AT BEDTIME Bedsole, Amy E, MD Taking Active   famotidine (PEPCID) 20 MG tablet [id number] Yes Take 1 tablet (20 mg total) by mouth at bedtime. Jerene Bears, MD Taking Active   ferrous sulfate 325 (65 FE) MG tablet [id number] Yes TAKE 1 TABLET BY MOUTH EVERY DAY WITH BREAKFAST Pyrtle, Lajuan Lines, MD Taking Active   furosemide (LASIX) 20 MG tablet [id number] Yes TAKE 1 TABLET BY MOUTH EVERY DAY Larey Dresser, MD Taking Active   metoprolol succinate (TOPROL-XL) 50 MG 24 hr tablet [id number] Yes TAKE 1  TABLET (50 MG TOTAL) BY MOUTH DAILY. TAKE WITH OR IMMEDIATELY FOLLOWING A MEAL. Larey Dresser, MD Taking Active   nitroGLYCERIN (NITROSTAT) 0.4 MG SL tablet [id number] Yes Place 1 tablet (0.4 mg total) under the tongue every 5 (five) minutes x 3 doses as needed for chest pain. [name], MD Taking Active Self           Med Note Stanford [place]   Tue Jun 10, 2020  9:01 AM) .  Omega-3 Fatty Acids (FISH OIL) 1000 MG CAPS [id number] Yes Take 1 capsule by mouth daily. [provider] Taking Active   pantoprazole (PROTONIX) 40 MG tablet [id number] Yes Take 1 tablet (40 mg total) by mouth daily. NEEDS OFFICE VISIT FOR FURTHER REFILLS Pyrtle, Lajuan Lines, MD Taking Active   rOPINIRole (REQUIP) 3 MG tablet [id number] Yes TAKE 1/2 TABLET BY MOUTH IN THE MORNING AND 1 TABLET AT NIGHT Bedsole, Amy E, MD Taking Active   sacubitril-valsartan (ENTRESTO) 24-26 MG [id number] Yes Take 1 tablet by mouth 2 (two) times daily. Larey Dresser, MD Taking Active   spironolactone (ALDACTONE) 25 MG tablet [id number] Yes Take 0.5 tablets (12.5 mg total) by mouth daily. Larey Dresser, MD Taking Active   tamsulosin (FLOMAX) 0.4 MG CAPS capsule [id number] Yes TAKE 1 CAPSULE BY MOUTH EVERY DAY [name], MD Taking Active             Patient Active Problem List   Diagnosis Date Noted   Testicular swelling, left 09/04/2020   Left groin pain 09/04/2020   Hydrocele in adult 09/04/2020   Epididymitis 09/04/2020   Bronchiectasis ([place]) 10/24/2019   Restless leg syndrome 05/30/2018   Abdominal aortic aneurysm (AAA) ([place]) 07/07/2015   Arteriosclerosis of coronary artery 07/07/2015   Cardiomyopathy, ischemic 07/07/2015   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   Solitary pulmonary nodule 04/16/2014   Counseling regarding end of life decision making 04/16/2014   HTN (hypertension) 10/03/2013   CAD S/P percutaneous coronary angioplasty 50/05/7046   Chronic systolic heart failure ([place])  09/19/2013   NSTEMI (non-ST elevated myocardial  infarction) ([place]) 09/10/2013   Family history of coronary artery disease in brother 09/10/2013   ED (erectile dysfunction) 12/10/2011   COPD, 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   COLONIC POLYPS, ADENOMATOUS 06/05/2008   BPH (benign prostatic hyperplasia) 06/05/2008   Prediabetes 05/14/2008   VENEREAL WART 05/07/2008   ONYCHOMYCOSIS, TOENAILS 05/07/2008   Hyperlipidemia 05/07/2008   Bilateral carotid artery stenosis 05/07/2008   INTERMITTENT CLAUDICATION 05/07/2008   HEART MURMUR, HX OF 05/07/2008   COLONIC POLYPS, HX OF 05/07/2008    Immunization History  Administered Date(s) Administered   PFIZER(Purple Top)SARS-COV-2 Vaccination 04/07/2019, 04/28/2019, 01/09/2020   Pneumococcal Conjugate-13 04/16/2014   Pneumococcal Polysaccharide-23 06/05/2008   Td 06/05/2008    Conditions to be addressed/monitored:  Hypertension, Hyperlipidemia, Heart Failure, Coronary Artery Disease, GERD, Tobacco use, and BPH, RLS  Care Plan : [place]  Updates made by Charlton Haws, [place] since 10/29/2020 12:00 AM     Problem: Hypertension, Hyperlipidemia, Heart Failure, Coronary Artery Disease, GERD, Tobacco use, and BPH, RLS      Long-Range Goal: Disease management   Start Date: 10/29/2020  Expected End Date: 10/29/2021  This Visit's Progress: On track  Priority: High  Note:   Current Barriers:  Unable to independently monitor therapeutic efficacy  Pharmacist Clinical Goal(s):  Patient will achieve adherence to monitoring guidelines and medication adherence to achieve therapeutic efficacy through collaboration with PharmD and provider.   Interventions: 1:1 collaboration with [name], MD regarding development and update of comprehensive plan of care as evidenced by provider attestation and co-signature Inter-disciplinary care team collaboration (see longitudinal plan of care) Comprehensive medication review performed; medication list  updated in electronic medical record  Hyperlipidemia / CAD (LDL goal < 70) -Controlled - LDL is at goal; pt endorses compliance with medications as prescribed; he denies bleeding issues; he has never had to use NTG -Hx NSTEMI 2015, intermittent claudication, bilateral carotid artery stenosis -Current treatment: Ezetimibe-simvastatin 10-40 mg daily HS Nitroglycerin 0.4 mg SL prn Aspirin 81 mg daily Clopidogrel 75 mg daily OTC fish oil 1000 mg daily -Current dietary patterns: "whatever my wife makes" -Current exercise habits: walking, yard work -Educated on [employer]; Benefits of statin for ASCVD risk reduction; benefits of antiplatelet therapy for maintaining stent patency -Recommended to continue current medication  Heart Failure / HTN (Goal: BP < 140/90, and prevent exacerbations) -Not ideally controlled - pt endorses compliance with medications as prescribed; he is not checking BP at home and BP in recent office visits have been above goal; pt thinks his BP is higher in office than at home -Pt is able to use $10 copay card for Entresto -Last ejection fraction: 40-45% (Date: 07/21/20) -HF type: Systolic -NYHA Class: II (slight limitation of activity) -Current treatment: Furosemide 20 mg daily Metoprolol succinate 50 mg daily Entresto 24-26 mg BID Spironolactone 25 mg - 1/2 tab daily -Medications previously tried: lisinopril  -Current home BP/HR readings: not checking -Educated on Benefits of medications for managing symptoms and prolonging life;  Importance of blood pressure control; explained the difference between spironolactone and furosemide as pt was confused about them both being diuretics -Recommended to continue current medication  Tobacco use (Goal: quit smoking) -Uncontrolled - pt is not ready quit; currently smoking ~1 ppd -Previous quit attempts: Chantix (recalled), bupropion (dry mouth) -On a scale of 1-10, reports MOTIVATION to quit is 0 -On a scale of 1-10,  reports CONFIDENCE in quitting is 0 -Counseled on risks of continued smoking and benefits of cessation; offered help if patient  changes his mind in the future  BPH (Goal: manage symptoms) -Controlled - pt endorses compliance; he follows with urology -Current treatment  Tamsulosin 0.4 mg daily -Recommended to continue current medication  GERD (Goal: manage symptoms) -Controlled - pt reports stomach symptoms are under control; he is not sure why he has both PPI and H2RA; he has been off pantoprazole for past 2 weeks while awaiting new Rx and does not feel any different off PPI -Current treatment  Pantoprazole 40 mg daily Famotidine 20 mg daily HS -Recommend to change pantoprazole to PRN  RLS (Goal: manage symptoms) -Controlled - pt reports RLS symptoms are somewhat worse, sometimes he takes extra 1/2 dose ~3am after symptoms wake him up -Current treatment  Ropinirole 3 mg - 1/2 tab AM and 1 tab HS Ferrous sulfate 325 mg daily -Explained pt is on maximum dose of ropinirole (manufacturer does not recommend exceeding 4 mg/day); other options for RLS include gabapentin or pregabalin; pt is ok with continuing ropinirole for now and will contact PCP if he wishes to change treatment  Health Maintenance -Vaccine gaps: Shingrix, covid booster -Counseled on Covid booster, Shingrix. Advised he get vaccines at local pharmacy.  Patient Goals/Self-Care Activities Patient will:  - take medications as prescribed focus on medication adherence by routine check blood pressure 2-3x a week, document, and provide at future appointments -Can change pantoprazole to as-needed -Can contact PCP if interested in changing restless leg treatment      Medication Assistance:  Entresto $10 copay card  Compliance/Adherence/Medication fill history: Care Gaps: Hep C screening  Star-Rating Drugs: Simvastatin-ezetimibe - LF 10/24/20 x 90 ds Entresto - LF 10/17/20 x 30 ds  Patient's preferred pharmacy  is:  [id number] - [place], [place] - [address] [place] [address] Phone: [phone] Fax: [phone]  Uses pill box? Yes Pt endorses 100% compliance  We discussed: Current pharmacy is preferred with insurance plan and patient is satisfied with pharmacy services Patient decided to: Continue current medication management strategy  Care Plan and Follow Up Patient Decision:  Patient agrees to Care Plan and Follow-up.  Plan: Telephone follow up appointment with care management team member scheduled for:  6 months  Charlene Brooke, PharmD, [place], CPP Clinical Pharmacist [facility] [phone]

## 2020-10-29 NOTE — Patient Instructions (Signed)
Visit Information  Phone number for Pharmacist: [phone]  Thank you for meeting with me to discuss your medications! I look forward to working with you to achieve your health care goals. Below is a summary of what we talked about during the visit:   Goals Addressed             This Visit's Progress    Track and Manage My Blood Pressure-Hypertension       Timeframe:  Long-Range Goal Priority:  High Start Date:         10/29/20                    Expected End Date:    10/29/21                   Follow Up Date Feb 2023   - check blood pressure 3 times per week - choose a place to take my blood pressure (home, clinic or office, retail store) - write blood pressure results in a log or diary    Why is this important?   You won't feel high blood pressure, but it can still hurt your blood vessels.  High blood pressure can cause heart or kidney problems. It can also cause a stroke.  Making lifestyle changes like losing a little weight or eating less salt will help.  Checking your blood pressure at home and at different times of the day can help to control blood pressure.  If the doctor prescribes medicine remember to take it the way the doctor ordered.  Call the office if you cannot afford the medicine or if there are questions about it.     Notes:         Care Plan : Erik [name]  Updates made by Erik [name], RPH since 10/29/2020 12:00 AM     Problem: Hypertension, Hyperlipidemia, Heart Failure, Coronary Artery Disease, GERD, Tobacco use, and BPH, RLS      Long-Range Goal: Disease management   Start Date: 10/29/2020  Expected End Date: 10/29/2021  This Visit's Progress: On track  Priority: High  Note:   Current Barriers:  Unable to independently monitor therapeutic efficacy  Pharmacist Clinical Goal(s):  Patient will achieve adherence to monitoring guidelines and medication adherence to achieve therapeutic efficacy through collaboration with PharmD  and provider.   Interventions: 1:1 collaboration with Erik Sanders, MD regarding development and update of comprehensive plan of care as evidenced by provider attestation and co-signature Inter-disciplinary care team collaboration (see longitudinal plan of care) Comprehensive medication review performed; medication list updated in electronic medical record  Hyperlipidemia / CAD (LDL goal < 70) -Controlled - LDL is at goal; pt endorses compliance with medications as prescribed; he denies bleeding issues; he has never had to use NTG -Hx NSTEMI 2015, intermittent claudication, bilateral carotid artery stenosis -Current treatment: Ezetimibe-simvastatin 10-40 mg daily HS Nitroglycerin 0.4 mg SL prn Aspirin 81 mg daily Clopidogrel 75 mg daily OTC fish oil 1000 mg daily -Current dietary patterns: "whatever my wife makes" -Current exercise habits: walking, yard work -Educated on [employer]; Benefits of statin for ASCVD risk reduction; benefits of antiplatelet therapy for maintaining stent patency -Recommended to continue current medication  Heart Failure / HTN (Goal: BP < 140/90, and prevent exacerbations) -Not ideally controlled - pt endorses compliance with medications as prescribed; he is not checking BP at home and BP in recent office visits have been above goal; pt thinks his BP is higher in  office than at home -Pt is able to use $10 copay card for Entresto -Last ejection fraction: 40-45% (Date: 07/21/20) -HF type: Systolic -NYHA Class: II (slight limitation of activity) -Current treatment: Furosemide 20 mg daily Metoprolol succinate 50 mg daily Entresto 24-26 mg BID Spironolactone 25 mg - 1/2 tab daily -Medications previously tried: lisinopril  -Current home BP/HR readings: not checking -Educated on Benefits of medications for managing symptoms and prolonging life;  Importance of blood pressure control; explained the difference between spironolactone and furosemide as pt was  confused about them both being diuretics -Recommended to continue current medication  Tobacco use (Goal: quit smoking) -Uncontrolled - pt is not ready quit; currently smoking ~1 ppd -Previous quit attempts: Chantix (recalled), bupropion (dry mouth) -On a scale of 1-10, reports MOTIVATION to quit is 0 -On a scale of 1-10, reports CONFIDENCE in quitting is 0 -Counseled on risks of continued smoking and benefits of cessation; offered help if patient changes his mind in the future  BPH (Goal: manage symptoms) -Controlled - pt endorses compliance; he follows with urology -Current treatment  Tamsulosin 0.4 mg daily -Recommended to continue current medication  GERD (Goal: manage symptoms) -Controlled - pt reports stomach symptoms are under control; he is not sure why he has both PPI and H2RA; he has been off pantoprazole for past 2 weeks while awaiting new Rx and does not feel any different off PPI -Current treatment  Pantoprazole 40 mg daily Famotidine 20 mg daily HS -Recommend to change pantoprazole to PRN  RLS (Goal: manage symptoms) -Controlled - pt reports RLS symptoms are somewhat worse, sometimes he takes extra 1/2 dose ~3am after symptoms wake him up -Current treatment  Ropinirole 3 mg - 1/2 tab AM and 1 tab HS Ferrous sulfate 325 mg daily -Explained pt is on maximum dose of ropinirole (manufacturer does not recommend exceeding 4 mg/day); other options for RLS include gabapentin or pregabalin; pt is ok with continuing ropinirole for now and will contact PCP if he wishes to change treatment  Health Maintenance -Vaccine gaps: Shingrix, covid booster -Counseled on Covid booster, Shingrix. Advised he get vaccines at local pharmacy.  Patient Goals/Self-Care Activities Patient will:  - take medications as prescribed focus on medication adherence by routine check blood pressure 2-3x a week, document, and provide at future appointments -Can change pantoprazole to as-needed -Can  contact PCP if interested in changing restless leg treatment      Erik [name] was given information about Chronic Care Management services today including:  CCM service includes personalized support from designated clinical staff supervised by his physician, including individualized plan of care and coordination with other care providers 24/7 contact phone numbers for assistance for urgent and routine care needs. Standard insurance, coinsurance, copays and deductibles apply for chronic care management only during months in which we provide at least 20 minutes of these services. Most insurances cover these services at 100%, however patients may be responsible for any copay, coinsurance and/or deductible if applicable. This service may help you avoid the need for more expensive face-to-face services. Only one practitioner may furnish and bill the service in a calendar month. The patient may stop CCM services at any time (effective at the end of the month) by phone call to the office staff.  Patient agreed to services and verbal consent obtained.   Patient verbalizes understanding of instructions provided today and agrees to view in [place].  Telephone follow up appointment with pharmacy team member scheduled for: 6 months  Charlene Brooke, PharmD,  BCACP, CPP Clinical Pharmacist [facility] [phone]

## 2020-11-07 ENCOUNTER — Ambulatory Visit
Admission: RE | Admit: 2020-11-07 | Discharge: 2020-11-07 | Disposition: A | Discharge status: Home / Self Care | Payer: Medicare Other | Source: Ambulatory Visit | Attending: Acute Care | Admitting: Acute Care

## 2020-11-07 ENCOUNTER — Other Ambulatory Visit: Payer: Self-pay

## 2020-11-07 DIAGNOSIS — R911 Solitary pulmonary nodule: Secondary | ICD-10-CM | POA: Diagnosis not present

## 2020-11-07 DIAGNOSIS — R918 Other nonspecific abnormal finding of lung field: Secondary | ICD-10-CM

## 2020-11-07 DIAGNOSIS — I700 Atherosclerosis of aorta: Secondary | ICD-10-CM | POA: Diagnosis not present

## 2020-11-19 NOTE — Progress Notes (Signed)
Please call patient and let him know his CT Chest shows a stable nodule that will need follow up in 6 months. Needs a 6 month follow up CT Chest without, Also needs to be assigned a new pulmonologist since Dr. Carlis Abbott is no longer OP. Please fax results to PCP and order 6 month CT Chest without., and get set up with Dr.Meier or [place].   Thanks so much

## 2020-11-20 ENCOUNTER — Other Ambulatory Visit: Payer: Self-pay | Admitting: Internal Medicine

## 2020-11-20 ENCOUNTER — Other Ambulatory Visit: Payer: Self-pay | Admitting: Family Medicine

## 2020-11-21 ENCOUNTER — Other Ambulatory Visit: Payer: Self-pay | Admitting: *Deleted

## 2020-11-21 DIAGNOSIS — R918 Other nonspecific abnormal finding of lung field: Secondary | ICD-10-CM

## 2020-12-16 ENCOUNTER — Telehealth: Payer: Self-pay

## 2020-12-16 NOTE — Chronic Care Management (AMB) (Addendum)
Chronic Care Management Pharmacy Assistant   Name: Erik [name].  MRN: [id number] DOB: 28-Mar-1940  Reason for Encounter:  Hypertension Disease State  Recent office visits:  None since last CCM contact  Recent consult visits:  None since last CCM contact  Hospital visits:  None in previous 6 months  Medications: Outpatient Encounter Medications as of 12/16/2020  Medication Sig Note   acetaminophen (TYLENOL) 500 MG tablet Take 500 mg by mouth every 6 (six) hours as needed (for pain.).    aspirin EC 81 MG tablet Take 81 mg by mouth every evening.     clopidogrel (PLAVIX) 75 MG tablet TAKE 1 TABLET BY MOUTH EVERY DAY    ezetimibe-simvastatin (VYTORIN) 10-40 MG tablet TAKE 1 TABLET BY MOUTH AT BEDTIME    famotidine (PEPCID) 20 MG tablet Take 1 tablet (20 mg total) by mouth at bedtime.    ferrous sulfate 325 (65 FE) MG tablet TAKE 1 TABLET BY MOUTH EVERY DAY WITH BREAKFAST    furosemide (LASIX) 20 MG tablet TAKE 1 TABLET BY MOUTH EVERY DAY    metoprolol succinate (TOPROL-XL) 50 MG 24 hr tablet TAKE 1 TABLET (50 MG TOTAL) BY MOUTH DAILY. TAKE WITH OR IMMEDIATELY FOLLOWING A MEAL.    nitroGLYCERIN (NITROSTAT) 0.4 MG SL tablet Place 1 tablet (0.4 mg total) under the tongue every 5 (five) minutes x 3 doses as needed for chest pain. 06/10/2020: .   Omega-3 Fatty Acids (FISH OIL) 1000 MG CAPS Take 1 capsule by mouth daily.    pantoprazole (PROTONIX) 40 MG tablet Take 1 tablet (40 mg total) by mouth daily. NEEDS OFFICE VISIT FOR FURTHER REFILLS    rOPINIRole (REQUIP) 3 MG tablet TAKE 1/2 TABLET BY MOUTH IN THE MORNING AND 1 TABLET AT NIGHT    sacubitril-valsartan (ENTRESTO) 24-26 MG Take 1 tablet by mouth 2 (two) times daily.    spironolactone (ALDACTONE) 25 MG tablet Take 0.5 tablets (12.5 mg total) by mouth daily.    tamsulosin (FLOMAX) 0.4 MG CAPS capsule TAKE 1 CAPSULE BY MOUTH EVERY DAY    No facility-administered encounter medications on file as of 12/16/2020.     Recent  Office Vitals: BP Readings from Last 3 Encounters:  09/10/20 (!) 153/66  09/04/20 (!) 148/68  08/21/20 140/60   Pulse Readings from Last 3 Encounters:  09/10/20 62  09/04/20 (!) 56  08/21/20 (!) 57    Wt Readings from Last 3 Encounters:  09/10/20 125 lb (56.7 kg)  09/04/20 129 lb 6 oz (58.7 kg)  08/21/20 130 lb 12.8 oz (59.3 kg)     Kidney Function Lab Results  Component Value Date/Time   CREATININE 1.04 09/01/2020 09:27 AM   CREATININE 0.94 08/21/2020 09:39 AM   CREATININE 0.9 01/09/2015 11:22 AM   GFR 62.68 07/03/2020 09:06 AM   GFRNONAA >60 09/01/2020 09:27 AM   GFRAA 95 08/18/2018 11:35 AM    BMP Latest Ref Rng & Units 09/01/2020 08/21/2020 07/03/2020  Glucose 70 - 99 mg/dL 98 109(H) 94  BUN 8 - 23 mg/dL 14 16 13   Creatinine 0.61 - 1.24 mg/dL 1.04 0.94 1.12  BUN/Creat Ratio 10 - 24 - - -  Sodium 135 - 145 mmol/L 136 135 135  Potassium 3.5 - 5.1 mmol/L 4.3 4.6 4.2  Chloride 98 - 111 mmol/L 103 102 100  CO2 22 - 32 mmol/L 28 27 29   Calcium 8.9 - 10.3 mg/dL 8.9 8.8(L) 9.0     Contacted patient on 12/16/20 to discuss hypertension disease  state.  Current antihypertensive regimen:  Furosemide 20 mg daily Metoprolol succinate 50 mg daily Entresto 24-26 mg BID Spironolactone 25 mg - 1/2 tab daily  Patient verbally confirms he is taking the above medications as directed. Yes  How often are you checking your Blood Pressure? daily  he checks his blood pressure in the morning before taking his medication.  Current home BP readings:  from the last week of September 2022         BP              98/44  125/50  154/61  160/63  98/42  115/52  107/48  Wrist or arm cuff: arm cuff Caffeine intake: He reports he drinks coffee all day  Salt intake: He will add to cooked foods if needed for taste OTC medications including pseudoephedrine or NSAIDs? no  Any readings above 180/120? No  What recent interventions/DTPs have been made by any provider to improve Blood Pressure  control since last CPP Visit: none identified  Any recent hospitalizations or ED visits since last visit with CPP? No  What diet changes have been made to improve Blood Pressure Control?  None identified   What exercise is being done to improve your Blood Pressure Control?  The patient reports he has a garden, does yard work and is working on his bathroom today .   Adherence Review: Is the patient currently on ACE/ARB medication? Yes Does the patient have >5 day gap between last estimated fill dates? No   Star Rating Drugs:  Medication:  Last Fill: Day Supply Entresto 24-26mg  11/24/20 90 Vytorin 10-40mg  10/24/20 90  Care Gaps: Annual wellness visit in last year? Yes   07/10/20 Most Recent BP reading: 153/66  62-P  09/10/20  Pulmonology appointment with on 12/23/20  Charlene Brooke, [place] notified  Avel Sensor, [place] Assistant [phone]  I have reviewed the care management and care coordination activities outlined in this encounter and I am certifying that I agree with the content of this note. No further action required.  Debbora Dus, PharmD Clinical Pharmacist [facility] [phone]

## 2020-12-22 NOTE — Progress Notes (Addendum)
Synopsis: Referred for COPD, pulmonary nodules by [name], MD  Subjective:   PATIENT ID: Erik [name]. GENDER: male DOB: [birth year], MRN: [id number]  Chief Complaint  Patient presents with   Consult    Patient is here to establish care FMR Dr. Carlis Abbott patient. Patient has no concerns at this time.    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 Carlis Abbott and last seen in our clinic 08/2019. He has also been followed in our lung cancer screening clinic.  At last visit planned to get AFBs, started on wellbutrin for smoking cessation. Started on stiolto. PFTs ordered.   Tried chantix for a month but after info came out about nitrosamine compound in it, he stopped. Tried wellbutrin but had dry mouth so severe that he stopped that as well. Chantix did not help him stop smoking for the month he was on it. Smoking 1ppd currently  He does have a cough, coughs up just a little bit of phlegm. He attributes it to smoking. He does not have night sweats, fever. No weight loss this year but has lost 20 lb over last 2 years.   He has some DOE with strenuous exertion. Hasn't worsened since last seen in clinic.  He tried stiolto, made no difference with DOE.   No admissions or courses of prednisone since last visit for COPD.  Otherwise pertinent review of systems is negative.  Past Medical History:  Diagnosis Date   Adenomatous colon polyp    Aortic aneurysm (HCC)    BPH (benign prostatic hypertrophy)    CAD (coronary artery disease)    Carotid artery stenosis    a. Bilateral CEA   Cataract    CHF (congestive heart failure) (HCC)    Chronic systolic heart failure (HCC)    a. EF 20-25%, mild LVH, mod HK, mid apicalanteroseptal myocardium, mild MR, LA mod dilated   Collagen vascular disease (HCC)    COPD (chronic obstructive pulmonary disease) ([place])    Coronary artery disease    a. LHC (08/2013): [place]: short 30%  distal, LAD: sml D1 & D2, 70% ostial D1, 95-99% LAD stenosis prox D2 LCx: sml/mod ramus subtot. occluded, 40% ostial set off lg OM1, 40% AV LCx after OM1, RCA: 90% prox (DES to RCA and prox LAD)   Diverticulosis    GERD (gastroesophageal reflux disease)    Heart murmur    History of colonic polyps    Hyperlipidemia    Hyperplastic colon polyp    Hypertension    Ischemic cardiomyopathy    RLS (restless legs syndrome)      Family History  Problem Relation Age of Onset   Heart disease Brother    Hyperlipidemia Brother    Pancreatic cancer Brother    Hypertension Mother    Cancer Father        unknown type; sounds GI   Emphysema Father    Hyperlipidemia Brother    Hyperlipidemia Brother    Heart attack Brother    Hyperlipidemia Brother    Multiple sclerosis Brother    Kidney disease Neg Hx    Prostate cancer Neg Hx    Colon cancer Neg Hx    Esophageal cancer Neg Hx    Rectal cancer Neg Hx    Stomach cancer Neg Hx      Past Surgical History:  Procedure Laterality Date   cardiac stents  09-2013   CAROTID ENDARTERECTOMY  04/17/2008  right   CAROTID ENDARTERECTOMY  05/30/08   Left   CATARACT EXTRACTION W/PHACO Right 10/05/2018   Procedure: CATARACT EXTRACTION PHACO AND INTRAOCULAR LENS PLACEMENT ([place]), RIGHT;  Surgeon: Birder Robson, MD;  Location: [facility] ORS;  Service: Ophthalmology;  Laterality: Right;  [place] 01:06.4 cde 12.79 Fluid Pack Lot # [id number]   CATARACT EXTRACTION W/PHACO Left 11/02/2018   Procedure: CATARACT EXTRACTION PHACO AND INTRAOCULAR LENS PLACEMENT ([place]), LEFT;  Surgeon: Birder Robson, MD;  Location: [facility] ORS;  Service: Ophthalmology;  Laterality: Left;  [place]  01:10 CDE 11.52 Fluid pack lot # [id number]   CHOLECYSTECTOMY     Gall Bladder   CORONARY ANGIOGRAM  09/11/2013   Procedure: CORONARY ANGIOGRAM;  Surgeon: Troy Sine, MD;  Location: [facility] CATH LAB;  Service: Cardiovascular;;   CORONARY ANGIOPLASTY     STENTS   HERNIA REPAIR     lower aorta  aneurysm  05/26/2015   [facility]   PERCUTANEOUS STENT INTERVENTION  09/11/2013   Procedure: PERCUTANEOUS STENT INTERVENTION;  Surgeon: Troy Sine, MD;  Location: [facility] CATH LAB;  Service: Cardiovascular;;  DES Prox RCA 3.5x15 xience     Social History   Socioeconomic History   Marital status: Married    Spouse name: Not on file   Number of children: Not on file   Years of education: Not on file   Highest education level: Not on file  Occupational History   Occupation: Retired Development worker, community  Tobacco Use   Smoking status: Every Day    Packs/day: 1.00    Years: 50.00    Pack years: 50.00    Types: Cigarettes   Smokeless tobacco: Former    Quit date: 09/10/2013   Tobacco comments:    off and on since age 2, currently smokes a pack a day [facility] 12/23/20  Vaping Use   Vaping Use: Never used  Substance and Sexual Activity   Alcohol use: No   Drug use: No   Sexual activity: Yes  Other Topics Concern   Not on file  Social History Narrative   Lives with wife in [place]. Retired from the post [employer]   Social Determinants of Radio broadcast assistant Strain: Not on file  Food Insecurity: Not on file  Transportation Needs: Not on file  Physical Activity: Not on file  Stress: Not on file  Social Connections: Not on file  Intimate Partner Violence: Not on file     Allergies  Allergen Reactions   Codeine Swelling    throat swells     Outpatient Medications Prior to Visit  Medication Sig Dispense Refill   acetaminophen (TYLENOL) 500 MG tablet Take 500 mg by mouth every 6 (six) hours as needed (for pain.).     aspirin EC 81 MG tablet Take 81 mg by mouth every evening.      clopidogrel (PLAVIX) 75 MG tablet TAKE 1 TABLET BY MOUTH EVERY DAY 90 tablet 3   ezetimibe-simvastatin (VYTORIN) 10-40 MG tablet TAKE 1 TABLET BY MOUTH AT BEDTIME 90 tablet 3   famotidine (PEPCID) 20 MG tablet Take 1 tablet (20 mg total) by mouth at bedtime. 1 tablet 0   ferrous sulfate 325 (65 FE) MG tablet TAKE 1  TABLET BY MOUTH EVERY DAY WITH BREAKFAST 90 tablet 0   furosemide (LASIX) 20 MG tablet TAKE 1 TABLET BY MOUTH EVERY DAY 90 tablet 3   metoprolol succinate (TOPROL-XL) 50 MG 24 hr tablet TAKE 1 TABLET (50 MG TOTAL) BY MOUTH DAILY. TAKE WITH OR IMMEDIATELY FOLLOWING  A MEAL. 90 tablet 3   nitroGLYCERIN (NITROSTAT) 0.4 MG SL tablet Place 1 tablet (0.4 mg total) under the tongue every 5 (five) minutes x 3 doses as needed for chest pain. 30 tablet 3   Omega-3 Fatty Acids (FISH OIL) 1000 MG CAPS Take 1 capsule by mouth daily.     pantoprazole (PROTONIX) 40 MG tablet Take 1 tablet (40 mg total) by mouth daily. NEEDS OFFICE VISIT FOR FURTHER REFILLS 90 tablet 0   rOPINIRole (REQUIP) 3 MG tablet TAKE 1/2 TABLET BY MOUTH IN THE MORNING AND 1 TABLET AT NIGHT 135 tablet 1   sacubitril-valsartan (ENTRESTO) 24-26 MG Take 1 tablet by mouth 2 (two) times daily. 180 tablet 3   spironolactone (ALDACTONE) 25 MG tablet Take 0.5 tablets (12.5 mg total) by mouth daily. 15 tablet 6   tamsulosin (FLOMAX) 0.4 MG CAPS capsule TAKE 1 CAPSULE BY MOUTH EVERY DAY 90 capsule 3   No facility-administered medications prior to visit.       Objective:   Physical Exam:  General appearance: 80 y.o., male, NAD, conversant  Eyes: anicteric sclerae, moist conjunctivae; no lid-lag; PERRL, tracking appropriately HENT: NCAT; oropharynx, MMM, no mucosal ulcerations; normal hard and soft palate Neck: Trachea midline; no lymphadenopathy, no JVD Lungs: CTAB, no crackles, no wheeze, with normal respiratory effort CV: RRR, no MRGs  Abdomen: Soft, non-tender; non-distended, BS present  Extremities: No peripheral edema, radial and DP pulses present bilaterally  Skin: Normal temperature, turgor and texture; no rash Psych: Appropriate affect Neuro: Alert and oriented to person and place, no focal deficit    Vitals:   12/23/20 1332  BP: (!) 120/40  Pulse: 73  Temp: 98.3 F (36.8 C)  TempSrc: Oral  SpO2: 98%  Weight: 125 lb 9.6  oz (57 kg)  Height: 5\' 6"  (1.676 m)   98% on RA BMI Readings from Last 3 Encounters:  12/23/20 20.27 kg/m  09/10/20 20.18 kg/m  09/04/20 20.88 kg/m   Wt Readings from Last 3 Encounters:  12/23/20 125 lb 9.6 oz (57 kg)  09/10/20 125 lb (56.7 kg)  09/04/20 129 lb 6 oz (58.7 kg)     CBC    Component Value Date/Time   WBC 7.5 06/10/2020 0942   RBC 4.16 (L) 06/10/2020 0942   HGB 12.1 (L) 06/10/2020 0942   HCT 37.6 (L) 06/10/2020 0942   PLT 263 06/10/2020 0942   MCV 90.4 06/10/2020 0942   MCH 29.1 06/10/2020 0942   MCHC 32.2 06/10/2020 0942   RDW 15.2 06/10/2020 0942   LYMPHSABS 1.2 09/10/2019 1231   MONOABS 0.6 09/10/2019 1231   EOSABS 0.2 09/10/2019 1231   BASOSABS 0.1 09/10/2019 1231    AFB 10/17/19 Mycobacterium Nebraskense AFB 08/24/19 Mycobacterium Nebraskense  Chest Imaging:   CT Chest 11/07/20 reviewed by me and remarkable for stable 64mm RUL nodule, relatively subtle bronchiectasis, no LAD  Pulmonary Functions Testing Results: PFT Results Latest Ref Rng & Units 10/22/2019  FVC-Pre L 3.14  FVC-Predicted Pre % 84  FVC-Post L 3.37  FVC-Predicted Post % 90  Pre FEV1/FVC % % 58  Post FEV1/FCV % % 68  FEV1-Pre L 1.81  FEV1-Predicted Pre % 68  FEV1-Post L 2.29  DLCO uncorrected ml/min/mmHg 19.66  DLCO UNC% % 86  DLCO corrected ml/min/mmHg 19.66  DLCO COR %Predicted % 86  DLVA Predicted % 101   Mmild obstruction with remarkable BD response, mild restriction, normal DLCO   Echocardiogram:   TTE with EF 40-45%, G1DD, moderate MR  Assessment & Plan:   # COPD gold functional group A-B: # Smoking:  # Pulmonary nodule: 77mm dominant nodule stable since 07/2019 # Bronchiectasis: mild  # Mycobacterium nebraskense infection: Not sure if treating worth the risk/ burden given unimpressive radiographic extent, possible symptoms are also mild and not especially burdensome. If there is decline in lung function and sputum persistently positive then will at least  give consideration to treatment.  Plan: - declines trial of another inhaler despite evidence for reduction in risk of exacerabation - PFTs - repeat AFB - start flutter valve 10 slow but firm puffs twice daily  - nodule follow up per LUNG RADS, next CT if stable in 6 months probably the last he needs - precontemplative with regard to cessation     Maryjane Hurter, MD [facility] 12/23/2020 1:43 PM

## 2020-12-23 ENCOUNTER — Encounter: Payer: Self-pay | Admitting: Student

## 2020-12-23 ENCOUNTER — Other Ambulatory Visit: Payer: Self-pay

## 2020-12-23 ENCOUNTER — Ambulatory Visit (INDEPENDENT_AMBULATORY_CARE_PROVIDER_SITE_OTHER): Payer: Medicare Other | Admitting: Student

## 2020-12-23 VITALS — BP 120/40 | HR 73 | Temp 98.3°F | Ht 66.0 in | Wt 125.6 lb

## 2020-12-23 DIAGNOSIS — J449 Chronic obstructive pulmonary disease, unspecified: Secondary | ICD-10-CM

## 2020-12-23 DIAGNOSIS — J479 Bronchiectasis, uncomplicated: Secondary | ICD-10-CM | POA: Diagnosis not present

## 2020-12-23 DIAGNOSIS — R918 Other nonspecific abnormal finding of lung field: Secondary | ICD-10-CM | POA: Diagnosis not present

## 2020-12-23 NOTE — Patient Instructions (Signed)
-   flutter valve 10 slow but firm puffs once to twice daily  - breathing tests ordered - if lung function decline and if sputum still growing mycobacterium nebraskense we'll think harder about whether it's worth treating - AFB sputum culture - follow up in 6 months

## 2020-12-30 ENCOUNTER — Other Ambulatory Visit: Payer: Medicare Other

## 2020-12-30 DIAGNOSIS — J479 Bronchiectasis, uncomplicated: Secondary | ICD-10-CM | POA: Diagnosis not present

## 2021-01-07 ENCOUNTER — Telehealth: Payer: Self-pay | Admitting: Student

## 2021-01-07 NOTE — Telephone Encounter (Signed)
Spoke with [facility] and clarified sputum orders. Nothing further needed at this time.

## 2021-01-14 DIAGNOSIS — H43813 Vitreous degeneration, bilateral: Secondary | ICD-10-CM | POA: Diagnosis not present

## 2021-02-13 ENCOUNTER — Other Ambulatory Visit (HOSPITAL_COMMUNITY): Payer: Self-pay | Admitting: Cardiology

## 2021-02-23 ENCOUNTER — Telehealth: Payer: Self-pay

## 2021-02-23 NOTE — Progress Notes (Signed)
Chronic Care Management Pharmacy Assistant   Name: Erik [name].  MRN: [id number] DOB: November 01, 1940  Reason for Encounter: CCM (Hypertension Disease State)   Recent office visits:  None since last CCM contact  Recent consult visits:  12/23/2020 - Pulmonology - Patient presented to establish care with no concerns at the time of the appointment. History of COPD, pulmonary nodules, bronchiectasis, smoking, HFrEF. Patient declines trial of another inhaler despite evidence for reduction in risk of exacerbation. Labs: AFB Culture & Smear, Flutter Valve and Pulmonary function test.   Hospital visits:  None in previous 6 months  Medications: Outpatient Encounter Medications as of 02/23/2021  Medication Sig Note   acetaminophen (TYLENOL) 500 MG tablet Take 500 mg by mouth every 6 (six) hours as needed (for pain.).    aspirin EC 81 MG tablet Take 81 mg by mouth every evening.     clopidogrel (PLAVIX) 75 MG tablet TAKE 1 TABLET BY MOUTH EVERY DAY    ezetimibe-simvastatin (VYTORIN) 10-40 MG tablet TAKE 1 TABLET BY MOUTH AT BEDTIME    famotidine (PEPCID) 20 MG tablet Take 1 tablet (20 mg total) by mouth at bedtime.    ferrous sulfate 325 (65 FE) MG tablet TAKE 1 TABLET BY MOUTH EVERY DAY WITH BREAKFAST    furosemide (LASIX) 20 MG tablet TAKE 1 TABLET BY MOUTH EVERY DAY    metoprolol succinate (TOPROL-XL) 50 MG 24 hr tablet TAKE 1 TABLET (50 MG TOTAL) BY MOUTH DAILY. TAKE WITH OR IMMEDIATELY FOLLOWING A MEAL.    nitroGLYCERIN (NITROSTAT) 0.4 MG SL tablet Place 1 tablet (0.4 mg total) under the tongue every 5 (five) minutes x 3 doses as needed for chest pain. 06/10/2020: .   Omega-3 Fatty Acids (FISH OIL) 1000 MG CAPS Take 1 capsule by mouth daily.    pantoprazole (PROTONIX) 40 MG tablet Take 1 tablet (40 mg total) by mouth daily. NEEDS OFFICE VISIT FOR FURTHER REFILLS    rOPINIRole (REQUIP) 3 MG tablet TAKE 1/2 TABLET BY MOUTH IN THE MORNING AND 1 TABLET AT NIGHT    sacubitril-valsartan  (ENTRESTO) 24-26 MG Take 1 tablet by mouth 2 (two) times daily.    spironolactone (ALDACTONE) 25 MG tablet TAKE 1/2 TABLET BY MOUTH EVERY DAY    tamsulosin (FLOMAX) 0.4 MG CAPS capsule TAKE 1 CAPSULE BY MOUTH EVERY DAY    No facility-administered encounter medications on file as of 02/23/2021.   Recent Office Vitals: BP Readings from Last 3 Encounters:  12/23/20 (!) 120/40  09/10/20 (!) 153/66  09/04/20 (!) 148/68   Pulse Readings from Last 3 Encounters:  12/23/20 73  09/10/20 62  09/04/20 (!) 56    Wt Readings from Last 3 Encounters:  12/23/20 125 lb 9.6 oz (57 kg)  09/10/20 125 lb (56.7 kg)  09/04/20 129 lb 6 oz (58.7 kg)    Kidney Function Lab Results  Component Value Date/Time   CREATININE 1.04 09/01/2020 09:27 AM   CREATININE 0.94 08/21/2020 09:39 AM   CREATININE 0.9 01/09/2015 11:22 AM   GFR 62.68 07/03/2020 09:06 AM   GFRNONAA >60 09/01/2020 09:27 AM   GFRAA 95 08/18/2018 11:35 AM   BMP Latest Ref Rng & Units 09/01/2020 08/21/2020 07/03/2020  Glucose 70 - 99 mg/dL 98 109(H) 94  BUN 8 - 23 mg/dL 14 16 13   Creatinine 0.61 - 1.24 mg/dL 1.04 0.94 1.12  BUN/Creat Ratio 10 - 24 - - -  Sodium 135 - 145 mmol/L 136 135 135  Potassium 3.5 - 5.1 mmol/L  4.3 4.6 4.2  Chloride 98 - 111 mmol/L 103 102 100  CO2 22 - 32 mmol/L 28 27 29   Calcium 8.9 - 10.3 mg/dL 8.9 8.8(L) 9.0   Contacted patient on 02/23/2021 to discuss hypertension disease state  Current antihypertensive regimen:  Furosemide 20 mg daily Metoprolol succinate 50 mg daily Entresto 24-26 mg BID Spironolactone 25 mg - 1/2 tab daily  Patient verbally confirms he is taking the above medications as directed. Yes  How often are you checking your Blood Pressure? Patient has not been checking his blood pressure. I asked patient to take his blood pressure starting today until Friday 12/16 when I will call him back for his log. Patient understood and agreed.   Wrist or arm cuff:arm cuff Caffeine intake: Patient drinks  coffee throughout the day - 4-5 cups Salt intake: Adds salt to food OTC medications including pseudoephedrine or NSAIDs? None  Any readings above 180/120? No  What recent interventions/DTPs have been made by any provider to improve Blood Pressure control since last CPP Visit: No recent interventions noted  Any recent hospitalizations or ED visits since last visit with CPP? No  What diet changes have been made to improve Blood Pressure Control?  No diet changes.  What exercise is being done to improve your Blood Pressure Control?  Patient states he walks his dogs every day.  Adherence Review: Is the patient currently on ACE/ARB medication? Yes Does the patient have >5 day gap between last estimated fill dates? No  Star Rating Drugs:  Medication:  Last Fill: Day Supply Entresto 24-26mg        01/14/2021 90 Vytorin 10-40mg          01/21/2021 90  Care Gaps: Annual wellness visit in last year? Yes 07/10/2020 Most Recent BP reading: 120/40 on 12/23/2020  CCM appointment on 04/29/2021 with Pecola Leisure, CPP notified  Marijean Niemann, [place] [phone]  Time Spent:  13 Minutes

## 2021-02-27 NOTE — Progress Notes (Signed)
Called patient for his blood pressure log.  Date  Blood Pressure Pulse 12/15  135/59   62 12/14  149/62   59 12/13  147/62   59 12/12  152/65   73  Charlene Brooke, CPP notified  Marijean Niemann, [place] Pharmacy Assistant [phone]  Time Spent: 10 Minutes

## 2021-03-02 ENCOUNTER — Telehealth: Payer: Self-pay | Admitting: Student

## 2021-03-02 DIAGNOSIS — J479 Bronchiectasis, uncomplicated: Secondary | ICD-10-CM

## 2021-03-03 LAB — AFB CULTURE WITH SMEAR (NOT AT ARMC)
Acid Fast Culture: POSITIVE — AB
Acid Fast Smear: NEGATIVE

## 2021-03-03 LAB — AFB ID BY DNA PROBE: M gordonae: POSITIVE — AB

## 2021-03-03 NOTE — Telephone Encounter (Signed)
Looked at pt's lab tab and have seen the positive AFB. This was detected at 5 weeks.  Routing this to Dr. Verlee Monte as an Juluis Rainier. Please advise.

## 2021-03-20 ENCOUNTER — Other Ambulatory Visit: Payer: Self-pay

## 2021-03-20 DIAGNOSIS — I6523 Occlusion and stenosis of bilateral carotid arteries: Secondary | ICD-10-CM

## 2021-03-20 DIAGNOSIS — I714 Abdominal aortic aneurysm, without rupture, unspecified: Secondary | ICD-10-CM

## 2021-03-31 LAB — AFB ID BY DNA PROBE
M avium complex: NEGATIVE
M gordonae: NEGATIVE
M kansasii: NEGATIVE
M tuberculosis complex: NEGATIVE

## 2021-03-31 LAB — AFB CULTURE WITH SMEAR (NOT AT ARMC)
Acid Fast Culture: POSITIVE — AB
Acid Fast Smear: NEGATIVE

## 2021-03-31 LAB — ORGANISM ID BY SEQUENCING

## 2021-04-01 ENCOUNTER — Other Ambulatory Visit: Payer: Self-pay | Admitting: Family Medicine

## 2021-04-02 ENCOUNTER — Ambulatory Visit (HOSPITAL_COMMUNITY)
Admission: RE | Admit: 2021-04-02 | Discharge: 2021-04-02 | Disposition: A | Discharge status: Home / Self Care | Payer: Medicare Other | Source: Ambulatory Visit | Attending: Vascular Surgery | Admitting: Vascular Surgery

## 2021-04-02 ENCOUNTER — Ambulatory Visit (INDEPENDENT_AMBULATORY_CARE_PROVIDER_SITE_OTHER): Payer: Medicare Other | Admitting: Physician Assistant

## 2021-04-02 ENCOUNTER — Other Ambulatory Visit: Payer: Self-pay

## 2021-04-02 ENCOUNTER — Ambulatory Visit (INDEPENDENT_AMBULATORY_CARE_PROVIDER_SITE_OTHER)
Admission: RE | Admit: 2021-04-02 | Discharge: 2021-04-02 | Disposition: A | Discharge status: Home / Self Care | Payer: Medicare Other | Source: Ambulatory Visit | Attending: Vascular Surgery | Admitting: Vascular Surgery

## 2021-04-02 VITALS — BP 124/50 | HR 61 | Temp 97.7°F | Resp 20 | Ht 66.0 in | Wt 122.0 lb

## 2021-04-02 DIAGNOSIS — I6523 Occlusion and stenosis of bilateral carotid arteries: Secondary | ICD-10-CM | POA: Diagnosis not present

## 2021-04-02 DIAGNOSIS — I714 Abdominal aortic aneurysm, without rupture, unspecified: Secondary | ICD-10-CM

## 2021-04-02 DIAGNOSIS — F17200 Nicotine dependence, unspecified, uncomplicated: Secondary | ICD-10-CM | POA: Diagnosis not present

## 2021-04-02 NOTE — Progress Notes (Signed)
HISTORY AND PHYSICAL     CC:  follow up. Requesting Provider:  [name], MD  HPI: This is a 81 y.o. male who is here today for follow up and is pt of Dr. Scot Dock and has hx of bilateral CEA (right for symptomatic (amaurosis fugax) and left for asymptomatic) in February and March of 2010 by Dr. Scot Dock and hx of FEVAR with Z-fen device in [place] in March 2017 by Dr. Sammuel Hines.  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 We have been following his FEVAR since 2020 at the request of the patient.   At his 2020 televisit, he was having some GI issues. He was seeing a GI specialist for abdominal pain, weight loss and frequent dark stools. He was not having any food fear or post prandial abdominal pain. He had a CT abdomen and pelvis obtained from [facility] that showed some Celiac and SMA stenosis but not >70% and likely not cause of his symptoms. His EVAR was stable on CTA.  Pt was last seen on 03/25/2020.  At that time, he was doing well without any abdominal pain and his weight had returned to normal.    The pt returns today for follow up.  He is not having any abdominal symptoms.  He feels he does have a hernia on the left side.   Pt denies any amaurosis fugax, speech difficulties, weakness, numbness, paralysis or clumsiness or facial droop.  He is having some right eye issues that is being followed by his ophthalmologist.   Pt denies claudication, rest pain, or non healing wounds.  He does have some pain in his thighs when he walks  The pt is on a statin for cholesterol management.    The pt is on an aspirin.    Other AC:  Plavix The pt is on ARB, BB, diuretic for hypertension.  The pt does not have diabetes. Tobacco hx:  current   Past Medical History:  Diagnosis Date   Adenomatous colon polyp    Aortic aneurysm (HCC)    BPH (benign prostatic hypertrophy)    CAD (coronary artery disease)    Carotid artery stenosis    a. Bilateral CEA    Cataract    CHF (congestive heart failure) (HCC)    Chronic systolic heart failure (HCC)    a. EF 20-25%, mild LVH, mod HK, mid apicalanteroseptal myocardium, mild MR, LA mod dilated   Collagen vascular disease (HCC)    COPD (chronic obstructive pulmonary disease) ([place])    Coronary artery disease    a. LHC (08/2013): [place]: short 30% distal, LAD: sml D1 & D2, 70% ostial D1, 95-99% LAD stenosis prox D2 LCx: sml/mod ramus subtot. occluded, 40% ostial set off lg OM1, 40% AV LCx after OM1, RCA: 90% prox (DES to RCA and prox LAD)   Diverticulosis    GERD (gastroesophageal reflux disease)    Heart murmur    History of colonic polyps    Hyperlipidemia    Hyperplastic colon polyp    Hypertension    Ischemic cardiomyopathy    RLS (restless legs syndrome)     Past Surgical History:  Procedure Laterality Date   cardiac stents  09-2013   CAROTID ENDARTERECTOMY  04/17/2008   right   CAROTID ENDARTERECTOMY  05/30/08   Left   CATARACT EXTRACTION W/PHACO Right 10/05/2018   Procedure: CATARACT EXTRACTION PHACO AND INTRAOCULAR LENS PLACEMENT ([place]), RIGHT;  Surgeon: Birder Robson, MD;  Location: [facility] ORS;  Service: Ophthalmology;  Laterality: Right;  [place] 01:06.4 cde 12.79 Fluid Pack Lot # [id number]   CATARACT EXTRACTION W/PHACO Left 11/02/2018   Procedure: CATARACT EXTRACTION PHACO AND INTRAOCULAR LENS PLACEMENT ([place]), LEFT;  Surgeon: Birder Robson, MD;  Location: [facility] ORS;  Service: Ophthalmology;  Laterality: Left;  [place]  01:10 CDE 11.52 Fluid pack lot # [id number]   CHOLECYSTECTOMY     Gall Bladder   CORONARY ANGIOGRAM  09/11/2013   Procedure: CORONARY ANGIOGRAM;  Surgeon: Troy Sine, MD;  Location: [facility] CATH LAB;  Service: Cardiovascular;;   CORONARY ANGIOPLASTY     STENTS   HERNIA REPAIR     lower aorta aneurysm  05/26/2015   [facility]   PERCUTANEOUS STENT INTERVENTION  09/11/2013   Procedure: PERCUTANEOUS STENT INTERVENTION;  Surgeon: Troy Sine, MD;  Location: [facility] CATH LAB;  Service:  Cardiovascular;;  DES Prox RCA 3.5x15 xience     Allergies  Allergen Reactions   Codeine Swelling    throat swells    Current Outpatient Medications  Medication Sig Dispense Refill   acetaminophen (TYLENOL) 500 MG tablet Take 500 mg by mouth every 6 (six) hours as needed (for pain.).     aspirin EC 81 MG tablet Take 81 mg by mouth every evening.      clopidogrel (PLAVIX) 75 MG tablet TAKE 1 TABLET BY MOUTH EVERY DAY 90 tablet 3   ezetimibe-simvastatin (VYTORIN) 10-40 MG tablet TAKE 1 TABLET BY MOUTH AT BEDTIME 90 tablet 3   famotidine (PEPCID) 20 MG tablet Take 1 tablet (20 mg total) by mouth at bedtime. 1 tablet 0   furosemide (LASIX) 20 MG tablet TAKE 1 TABLET BY MOUTH EVERY DAY 90 tablet 3   metoprolol succinate (TOPROL-XL) 50 MG 24 hr tablet TAKE 1 TABLET (50 MG TOTAL) BY MOUTH DAILY. TAKE WITH OR IMMEDIATELY FOLLOWING A MEAL. 90 tablet 3   nitroGLYCERIN (NITROSTAT) 0.4 MG SL tablet Place 1 tablet (0.4 mg total) under the tongue every 5 (five) minutes x 3 doses as needed for chest pain. 30 tablet 3   Omega-3 Fatty Acids (FISH OIL) 1000 MG CAPS Take 1 capsule by mouth daily.     pantoprazole (PROTONIX) 40 MG tablet Take 1 tablet (40 mg total) by mouth daily. NEEDS OFFICE VISIT FOR FURTHER REFILLS 90 tablet 0   rOPINIRole (REQUIP) 3 MG tablet TAKE 1/2 TABLET BY MOUTH IN THE MORNING AND 1 TABLET AT NIGHT 135 tablet 1   sacubitril-valsartan (ENTRESTO) 24-26 MG Take 1 tablet by mouth 2 (two) times daily. 180 tablet 3   spironolactone (ALDACTONE) 25 MG tablet TAKE 1/2 TABLET BY MOUTH EVERY DAY 45 tablet 2   tamsulosin (FLOMAX) 0.4 MG CAPS capsule TAKE 1 CAPSULE BY MOUTH EVERY DAY 90 capsule 3   ferrous sulfate 325 (65 FE) MG tablet TAKE 1 TABLET BY MOUTH EVERY DAY WITH BREAKFAST (Patient not taking: Reported on 04/02/2021) 90 tablet 0   No current facility-administered medications for this visit.    Family History  Problem Relation Age of Onset   Heart disease Brother    Hyperlipidemia  Brother    Pancreatic cancer Brother    Hypertension Mother    Cancer Father        unknown type; sounds GI   Emphysema Father    Hyperlipidemia Brother    Hyperlipidemia Brother    Heart attack Brother    Hyperlipidemia Brother    Multiple sclerosis Brother    Kidney disease Neg Hx    Prostate cancer Neg Hx  Colon cancer Neg Hx    Esophageal cancer Neg Hx    Rectal cancer Neg Hx    Stomach cancer Neg Hx     Social History   Socioeconomic History   Marital status: Married    Spouse name: Not on file   Number of children: Not on file   Years of education: Not on file   Highest education level: Not on file  Occupational History   Occupation: Retired Development worker, community  Tobacco Use   Smoking status: Every Day    Packs/day: 1.00    Years: 50.00    Pack years: 50.00    Types: Cigarettes   Smokeless tobacco: Former    Quit date: 09/10/2013   Tobacco comments:    off and on since age 65, currently smokes a pack a day [facility] 12/23/20  Vaping Use   Vaping Use: Never used  Substance and Sexual Activity   Alcohol use: No   Drug use: No   Sexual activity: Yes  Other Topics Concern   Not on file  Social History Narrative   Lives with wife in [place]. Retired from the post [employer]   Social Determinants of Radio broadcast assistant Strain: Not on file  Food Insecurity: Not on file  Transportation Needs: Not on file  Physical Activity: Not on file  Stress: Not on file  Social Connections: Not on file  Intimate Partner Violence: Not on file     REVIEW OF SYSTEMS:   [X]  denotes positive finding, [ ]  denotes negative finding Cardiac  Comments:  Chest pain or chest pressure:    Shortness of breath upon exertion:    Short of breath when lying flat:    Irregular heart rhythm:        Vascular    Pain in calf, thigh, or hip brought on by ambulation:    Pain in feet at night that wakes you up from your sleep:     Blood clot in your veins:    Leg swelling:  x       Pulmonary     Oxygen at home:    Wheezing:         Neurologic    Sudden weakness in arms or legs:     Sudden numbness in arms or legs:     Sudden onset of difficulty speaking or understanding others    Temporary loss of vision in one eye:     Problems with dizziness:         Gastrointestinal    Blood in stool:     Vomited blood:         Genitourinary    Burning when urinating:     Blood in urine:        Psychiatric    Major depression:         Hematologic    Bleeding problems:    Problems with blood clotting too easily:        Skin    Rashes or ulcers:        Constitutional    Fever or chills:      PHYSICAL EXAMINATION:  Today's Vitals   04/02/21 0900 04/02/21 0903  BP: (!) 127/53 (!) 124/50  Pulse: 61   Resp: 20   Temp: 97.7 F (36.5 C)   TempSrc: Temporal   SpO2: 97%   Weight: 122 lb (55.3 kg)   Height: 5\' 6"  (1.676 m)   PainSc: 4    PainLoc: Back  Body mass index is 19.69 kg/m.   General:  WDWN in NAD; vital signs documented above Gait: Not observed HENT: WNL, normocephalic Pulmonary: normal non-labored breathing  Cardiac: regular HR;  without carotid bruits Abdomen: soft, NT, aortic pulse is not palpable;  Skin: without rashes Vascular Exam/Pulses:  Right Left  Radial 2+ (normal) 2+ (normal)  Femoral 2+ (normal) 2+ (normal)  Popliteal Unable to palpate Unable to palpate  PT 2+ (normal) 2+ (normal)   Extremities: without ischemic changes, without Gangrene , without cellulitis; without open wounds;  Musculoskeletal: no muscle wasting or atrophy  Neurologic: A&O X 3;  speech is fluent/normal; moving all extremities equally  Psychiatric:  The pt has Normal affect.   Non-Invasive Vascular Imaging:    EVAR Arterial duplex on 04/02/2021: Endovascular Aortic Repair (EVAR):  +----------+----------------+-------------------+-------------------+              Diameter AP (cm) Diameter Trans (cm) Velocities (cm/sec)    +----------+----------------+-------------------+-------------------+   Aorta      3.11             3.37                43                    +----------+----------------+-------------------+-------------------+   Right Limb 1.01             1.03                117                   +----------+----------------+-------------------+-------------------+   Left Limb  1.03             1.15                120                   +----------+----------------+-------------------+-------------------+   Summary:  Abdominal Aorta: Patent endovascular aneurysm repair with no evidence of  endoleak. Maximum diameter measurement is 3.37 cm.   Non-Invasive Vascular Imaging:   Carotid Duplex on 04/02/2021: Patent bilateral CEA without evidence of restenosis    ASSESSMENT/PLAN:: 81 y.o. male here for follow up for AAA s/p FEVAR in 2017 in [place] and bilateral CEA in 2010 (right for symptomatic stenosis and left for asymptomatic stenosis)    AAA -duplex today shows slight increase with maximum diameter of 3.37cm (3.02cm at last visit).  Only 69mm increase over one year and could possibly be due to technique of tech.   -pt does not have abdominal pain  -pt will f/u in one year with EVAR duplex  Carotid stenosis -duplex today reveals no restenosis of bilateral CEA sites. He remains asymptomatic -pt will f/u in one year with duplex  Current smoker -discussed importance of smoking cessation.  No interest in quitting at this time.  -continue statin/asa/plavix   Leontine Locket, [facility] [phone]  Clinic MD:   Scot Dock

## 2021-04-10 ENCOUNTER — Other Ambulatory Visit: Payer: Self-pay | Admitting: Family Medicine

## 2021-04-10 ENCOUNTER — Other Ambulatory Visit: Payer: Self-pay | Admitting: Internal Medicine

## 2021-04-10 ENCOUNTER — Other Ambulatory Visit (HOSPITAL_COMMUNITY): Payer: Self-pay | Admitting: Cardiology

## 2021-04-13 ENCOUNTER — Other Ambulatory Visit (HOSPITAL_COMMUNITY): Payer: Self-pay | Admitting: *Deleted

## 2021-04-13 MED ORDER — LISINOPRIL 5 MG PO TABS: 5.0000 mg | ORAL_TABLET | Freq: Every day | ORAL | 0 refills | Status: DC

## 2021-04-24 ENCOUNTER — Telehealth: Payer: Self-pay

## 2021-04-24 NOTE — Progress Notes (Signed)
° ° °  Chronic Care Management Pharmacy Assistant   Name: Mozell Hardacre.  MRN: [id number] DOB: 1940/10/02  Reason for Encounter: CCM (Appointment Reminder)   Medications: Outpatient Encounter Medications as of 04/24/2021  Medication Sig Note   acetaminophen (TYLENOL) 500 MG tablet Take 500 mg by mouth every 6 (six) hours as needed (for pain.).    aspirin EC 81 MG tablet Take 81 mg by mouth every evening.     clopidogrel (PLAVIX) 75 MG tablet TAKE 1 TABLET BY MOUTH EVERY DAY    ezetimibe-simvastatin (VYTORIN) 10-40 MG tablet TAKE 1 TABLET BY MOUTH AT BEDTIME    famotidine (PEPCID) 20 MG tablet Take 1 tablet (20 mg total) by mouth at bedtime.    ferrous sulfate 325 (65 FE) MG tablet TAKE 1 TABLET BY MOUTH EVERY DAY WITH BREAKFAST (Patient not taking: Reported on 04/02/2021)    furosemide (LASIX) 20 MG tablet TAKE 1 TABLET BY MOUTH EVERY DAY    lisinopril (ZESTRIL) 5 MG tablet Take 1 tablet (5 mg total) by mouth daily. NEEDS FOLLOW UP APPOINTMENT FOR MORE REFILLS    metoprolol succinate (TOPROL-XL) 50 MG 24 hr tablet TAKE 1 TABLET (50 MG TOTAL) BY MOUTH DAILY. TAKE WITH OR IMMEDIATELY FOLLOWING A MEAL.    nitroGLYCERIN (NITROSTAT) 0.4 MG SL tablet Place 1 tablet (0.4 mg total) under the tongue every 5 (five) minutes x 3 doses as needed for chest pain. 06/10/2020: .   Omega-3 Fatty Acids (FISH OIL) 1000 MG CAPS Take 1 capsule by mouth daily.    pantoprazole (PROTONIX) 40 MG tablet Take 1 tablet (40 mg total) by mouth daily. NEEDS OFFICE VISIT FOR FURTHER REFILLS    rOPINIRole (REQUIP) 3 MG tablet TAKE 1/2 TABLET BY MOUTH IN THE MORNING AND 1 TABLET AT NIGHT    sacubitril-valsartan (ENTRESTO) 24-26 MG Take 1 tablet by mouth 2 (two) times daily.    spironolactone (ALDACTONE) 25 MG tablet TAKE 1/2 TABLET BY MOUTH EVERY DAY    tamsulosin (FLOMAX) 0.4 MG CAPS capsule TAKE 1 CAPSULE BY MOUTH EVERY DAY    No facility-administered encounter medications on file as of 04/24/2021.   [name] was contacted to remind of upcoming telephone visit with Charlene Brooke on 04/29/2021 at 3:30 pm. Patient was reminded to have all medications, supplements and any blood glucose and blood pressure readings available for review at appointment. If unable to reach, a voicemail was left for patient.   Are you having any problems with your medications? No   Do you have any concerns you like to discuss with the pharmacist? No  Star Rating Drugs: Medication:    Last Fill: Day Supply Ezetimibe-Simvastatin 10-40 mg 04/20/2021 90  Sacubitril-Valsartan 24-26 mg 02/20/2021 90 Lisinopril 5 mg    05/06/2020 90 Fill dates verified with [facility]   Charlene Brooke, CPP notified  Marijean Niemann, [place]  Time Spent: 10 Minutes

## 2021-04-29 ENCOUNTER — Ambulatory Visit (INDEPENDENT_AMBULATORY_CARE_PROVIDER_SITE_OTHER): Payer: Medicare Other | Admitting: Pharmacist

## 2021-04-29 ENCOUNTER — Other Ambulatory Visit: Payer: Self-pay

## 2021-04-29 DIAGNOSIS — Z9861 Coronary angioplasty status: Secondary | ICD-10-CM

## 2021-04-29 DIAGNOSIS — K219 Gastro-esophageal reflux disease without esophagitis: Secondary | ICD-10-CM

## 2021-04-29 DIAGNOSIS — Z720 Tobacco use: Secondary | ICD-10-CM

## 2021-04-29 DIAGNOSIS — G2581 Restless legs syndrome: Secondary | ICD-10-CM

## 2021-04-29 DIAGNOSIS — I10 Essential (primary) hypertension: Secondary | ICD-10-CM

## 2021-04-29 DIAGNOSIS — I5022 Chronic systolic (congestive) heart failure: Secondary | ICD-10-CM

## 2021-04-29 DIAGNOSIS — I2510 Atherosclerotic heart disease of native coronary artery without angina pectoris: Secondary | ICD-10-CM

## 2021-04-29 DIAGNOSIS — E785 Hyperlipidemia, unspecified: Secondary | ICD-10-CM

## 2021-04-29 NOTE — Progress Notes (Signed)
Chronic Care Management Pharmacy Note  04/29/2021 Name:  Erik [name]. MRN:  [id number] DOB:  1940/05/29  Summary: CCM follow up -Pt endorses compliance with medications as prescribed; he denies new issues/concerns since last CCM visit -Pt-reported BP at home is at goal (avg 125/60) -Pt is still smoking and not ready to quit  Recommendations/Changes made from today's visit: -No med changes  Follow up: -Erik [name] will call patient 6 months for general adherence review -Pharmacist follow up televisit scheduled for 1 year -PCP annual visit 07/14/21   Subjective: Erik [name]. is an 81 y.o. year old male who is a primary patient of Bedsole, Amy E, MD.  The CCM team was consulted for assistance with disease management and care coordination needs.    Engaged with patient by telephone for follow up visit in response to provider referral for pharmacy case management and/or care coordination services.   Consent to Services:  The patient was given information about Chronic Care Management services, agreed to services, and gave verbal consent prior to initiation of services.  Please see initial visit note for detailed documentation.   Patient Care Team: [name], MD as PCP - General (Family Medicine) Larey Dresser, MD as PCP - Cardiology (Cardiology) Charlton Haws, [facility] as Pharmacist (Pharmacist)  Recent office visits: 09/04/20 Dr. Loura Pardon, Family Medicine OV: c/o swollen groin. Ordered Testicular US 06/20/20 Dr. [name] (PCP): annual visit; try higher dose of ropinirole for RLS  Recent consult visits: 04/02/21 PA Leontine Locket (VVS): f/u AAA. Duplex wnl, no changes.  12/23/20 Dr Verlee Monte (pulmonary): f/u bronchiectasis, COPD. Pt declined trial of another inhaler. Start flutter valve BID. Ordered PFTs and AFB. If lung function worsens will consider whether mycobacterium is worth treating.  09/10/20 Dr. John Giovanni, Urology: f/u  hydrocele 08/21/20 Dr Aundra Dubin (cardiology): f/u CHF. Start spironolactone 12.5 mg daily, repeat BMP 10 days. F/U 6 months 06/10/20 Dr Aundra Dubin (cardiology): f/u CHF, smoking. Stop lisinopril and started Entresto 24-26 mg BID and bupropion 150 mg (smoking).  Hospital visits: None in previous 6 months   Objective:  Lab Results  Component Value Date   CREATININE 1.04 09/01/2020   BUN 14 09/01/2020   GFR 62.68 07/03/2020   GFRNONAA >60 09/01/2020   GFRAA 95 08/18/2018   NA 136 09/01/2020   K 4.3 09/01/2020   CALCIUM 8.9 09/01/2020   CO2 28 09/01/2020   GLUCOSE 98 09/01/2020    Lab Results  Component Value Date/Time   HGBA1C 5.6 07/03/2020 09:06 AM   HGBA1C 5.8 05/31/2019 09:09 AM   GFR 62.68 07/03/2020 09:06 AM   GFR 73.98 05/31/2019 09:09 AM    Last diabetic Eye exam: No results found for: HMDIABEYEEXA  Last diabetic Foot exam: No results found for: HMDIABFOOTEX   Lab Results  Component Value Date   CHOL 100 07/03/2020   HDL 45.60 07/03/2020   LDLCALC 37 07/03/2020   LDLDIRECT 176.9 05/09/2008   TRIG 86.0 07/03/2020   CHOLHDL 2 07/03/2020    Hepatic Function Latest Ref Rng & Units 07/03/2020 05/31/2019 07/25/2018  Total Protein 6.0 - 8.3 g/dL 7.5 6.7 7.2  Albumin 3.5 - 5.2 g/dL 3.8 3.7 3.8  AST 0 - 37 U/L _0 ALT 0 - 53 U/L _1 Alk Phosphatase 39 - 117 U/L 66 69 69  Total Bilirubin 0.2 - 1.2 mg/dL 0.5 0.4 0.6  Bilirubin, Direct 0.0 - 0.3 mg/dL - - -  Lab Results  Component Value Date/Time   TSH 1.178 02/05/2015 05:10 PM    CBC Latest Ref Rng & Units 06/10/2020 09/10/2019 05/01/2019  WBC 4.0 - 10.5 K/uL 7.5 7.9 11.9(H)  Hemoglobin 13.0 - 17.0 g/dL 12.1(L) 11.7(L) 13.1  Hematocrit 39.0 - 52.0 % 37.6(L) 36.3(L) 40.0  Platelets 150 - 400 K/uL 263 234 280.0    No results found for: VD25OH  Clinical ASCVD: Yes  The ASCVD Risk score (Arnett DK, et al., 2019) failed to calculate for the following reasons:   The patient has a prior MI or stroke diagnosis     Depression screen [facility] 2/9 07/10/2020 05/31/2019 05/30/2018  Decreased Interest 0 0 0  Down, Depressed, Hopeless 0 0 0  PHQ - 2 Score 0 0 0  Altered sleeping - 0 -  Tired, decreased energy - 0 -  Change in appetite - 0 -  Feeling bad or failure about yourself  - 0 -  Trouble concentrating - 0 -  Moving slowly or fidgety/restless - 0 -  Suicidal thoughts - 0 -  PHQ-9 Score - 0 -  Difficult doing work/chores - Not difficult at all -  Some recent data might be hidden      Social History   Tobacco Use  Smoking Status Every Day   Packs/day: 1.00   Years: 50.00   Pack years: 50.00   Types: Cigarettes  Smokeless Tobacco Former   Quit date: 09/10/2013  Tobacco Comments   off and on since age 79, currently smokes a pack a day [facility] 12/23/20   BP Readings from Last 3 Encounters:  04/02/21 (!) 124/50  12/23/20 (!) 120/40  09/10/20 (!) 153/66   Pulse Readings from Last 3 Encounters:  04/02/21 61  12/23/20 73  09/10/20 62   Wt Readings from Last 3 Encounters:  04/02/21 122 lb (55.3 kg)  12/23/20 125 lb 9.6 oz (57 kg)  09/10/20 125 lb (56.7 kg)   BMI Readings from Last 3 Encounters:  04/02/21 19.69 kg/m  12/23/20 20.27 kg/m  09/10/20 20.18 kg/m    Assessment/Interventions: Review of patient past medical history, allergies, medications, health status, including review of consultants reports, laboratory and other test data, was performed as part of comprehensive evaluation and provision of chronic care management services.   SDOH:  (Social Determinants of Health) assessments and interventions performed: Yes SDOH Interventions    Flowsheet Row Most Recent Value  SDOH Interventions   Food Insecurity Interventions Intervention Not Indicated  Financial Strain Interventions Intervention Not Indicated      SDOH Screenings   Alcohol Screen: Not on file  Depression (PHQ2-9): Low Risk    PHQ-2 Score: 0  Financial Resource Strain: Low Risk    Difficulty of Paying Living  Expenses: Not very hard  Food Insecurity: No Food Insecurity   Worried About Charity fundraiser in the Last Year: Never true   Ran Out of Food in the Last Year: Never true  Housing: Not on file  Physical Activity: Not on file  Social Connections: Not on file  Stress: Not on file  Tobacco Use: High Risk   Smoking Tobacco Use: Every Day   Smokeless Tobacco Use: Former   Passive Exposure: Not on file  Transportation Needs: Not on file    [place]  Allergies  Allergen Reactions   Codeine Swelling    throat swells    Medications Reviewed Today     Reviewed by Charlton Haws, [facility] (Pharmacist) on 04/29/21 at  [place] List Status: <None>   Medication Order Taking? Sig Documenting Provider Last Dose Status Informant  acetaminophen (TYLENOL) 500 MG tablet [id number] Yes Take 500 mg by mouth every 6 (six) hours as needed (for pain.). [provider] Taking Active Self           Med Note Kenton Kingfisher, Enrique Sack T   Tue Oct 03, 2018 10:05 AM)    aspirin EC 81 MG tablet [id number] Yes Take 81 mg by mouth every evening.  [provider] Taking Active Self  clopidogrel (PLAVIX) 75 MG tablet [id number] Yes TAKE 1 TABLET BY MOUTH EVERY DAY Larey Dresser, MD Taking Active   ezetimibe-simvastatin (VYTORIN) 10-40 MG tablet [id number] Yes TAKE 1 TABLET BY MOUTH AT BEDTIME Bedsole, Amy E, MD Taking Active   famotidine (PEPCID) 20 MG tablet [id number] Yes Take 1 tablet (20 mg total) by mouth at bedtime. Jerene Bears, MD Taking Active   ferrous sulfate 325 (65 FE) MG tablet [id number] Yes TAKE 1 TABLET BY MOUTH EVERY DAY WITH BREAKFAST Pyrtle, Lajuan Lines, MD Taking Active   furosemide (LASIX) 20 MG tablet [id number] Yes TAKE 1 TABLET BY MOUTH EVERY DAY Larey Dresser, MD Taking Active   lisinopril (ZESTRIL) 5 MG tablet [id number] Yes Take 1 tablet (5 mg total) by mouth daily. NEEDS FOLLOW UP APPOINTMENT FOR MORE REFILLS Larey Dresser, MD Taking Active   metoprolol succinate  (TOPROL-XL) 50 MG 24 hr tablet [id number] Yes TAKE 1 TABLET (50 MG TOTAL) BY MOUTH DAILY. TAKE WITH OR IMMEDIATELY FOLLOWING A MEAL. Larey Dresser, MD Taking Active   nitroGLYCERIN (NITROSTAT) 0.4 MG SL tablet [id number] Yes Place 1 tablet (0.4 mg total) under the tongue every 5 (five) minutes x 3 doses as needed for chest pain. [name], MD Taking Active Self           Med Note [place]   Tue Jun 10, 2020  9:01 AM) .  Omega-3 Fatty Acids (FISH OIL) 1000 MG CAPS [id number] Yes Take 1 capsule by mouth daily. [provider] Taking Active   rOPINIRole (REQUIP) 3 MG tablet [id number] Yes TAKE 1/2 TABLET BY MOUTH IN THE MORNING AND 1 TABLET AT NIGHT Bedsole, Amy E, MD Taking Active   sacubitril-valsartan (ENTRESTO) 24-26 MG [id number] Yes Take 1 tablet by mouth 2 (two) times daily. Larey Dresser, MD Taking Active   spironolactone (ALDACTONE) 25 MG tablet [id number] Yes TAKE 1/2 TABLET BY MOUTH EVERY DAY Larey Dresser, MD Taking Active   tamsulosin (FLOMAX) 0.4 MG CAPS capsule [id number] Yes TAKE 1 CAPSULE BY MOUTH EVERY DAY [name], MD Taking Active             Patient Active Problem List   Diagnosis Date Noted   Testicular swelling, left 09/04/2020   Left groin pain 09/04/2020   Hydrocele in adult 09/04/2020   Epididymitis 09/04/2020   Bronchiectasis ([place]) 10/24/2019   Restless leg syndrome 05/30/2018   Abdominal aortic aneurysm (AAA) 07/07/2015   Arteriosclerosis of coronary artery 07/07/2015   Cardiomyopathy, ischemic 07/07/2015   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   Solitary pulmonary nodule 04/16/2014   Counseling regarding end of life decision making 04/16/2014   HTN (hypertension) 10/03/2013   CAD S/P percutaneous coronary angioplasty 23/53/6144   Chronic systolic heart failure ([place]) 09/19/2013   NSTEMI (non-ST elevated myocardial infarction) ([place]) 09/10/2013   Family history of coronary artery disease in brother  09/10/2013   ED (erectile dysfunction)  12/10/2011   COPD, mild ([place]) 06/20/2008   COLONIC POLYPS, ADENOMATOUS 06/05/2008   BPH (benign prostatic hyperplasia) 06/05/2008   Prediabetes 05/14/2008   VENEREAL WART 05/07/2008   ONYCHOMYCOSIS, TOENAILS 05/07/2008   Hyperlipidemia 05/07/2008   Bilateral carotid artery stenosis 05/07/2008   INTERMITTENT CLAUDICATION 05/07/2008   HEART MURMUR, HX OF 05/07/2008   COLONIC POLYPS, HX OF 05/07/2008    Immunization History  Administered Date(s) Administered   PFIZER(Purple Top)SARS-COV-2 Vaccination 04/07/2019, 04/28/2019, 01/09/2020   Pneumococcal Conjugate-13 04/16/2014   Pneumococcal Polysaccharide-23 06/05/2008   Td 06/05/2008    Conditions to be addressed/monitored:  Hypertension, Hyperlipidemia, Heart Failure, Coronary Artery Disease, GERD, and Tobacco use, RLS  Care Plan : [place]  Updates made by Charlton Haws, [place] since 04/29/2021 12:00 AM     Problem: Hypertension, Hyperlipidemia, Heart Failure, Coronary Artery Disease, GERD, Tobacco use, RLS   Priority: High     Long-Range Goal: Disease management   Start Date: 10/29/2020  Expected End Date: 10/29/2021  Recent Progress: On track  Priority: High  Note:   Current Barriers:  Unable to independently monitor therapeutic efficacy  Pharmacist Clinical Goal(s):  Patient will achieve adherence to monitoring guidelines and medication adherence to achieve therapeutic efficacy through collaboration with PharmD and provider.   Interventions: 1:1 collaboration with [name], MD regarding development and update of comprehensive plan of care as evidenced by provider attestation and co-signature Inter-disciplinary care team collaboration (see longitudinal plan of care) Comprehensive medication review performed; medication list updated in electronic medical record  Hyperlipidemia / CAD (LDL goal < 70) -Controlled - LDL is at goal; pt endorses compliance with  medications as prescribed; he denies bleeding issues; he has never had to use NTG -Hx NSTEMI 2015, intermittent claudication, bilateral carotid artery stenosis -Current treatment: Ezetimibe-simvastatin 10-40 mg daily HS - Appropriate, Effective, Safe, Accessible Nitroglycerin 0.4 mg SL prn -Appropriate, Effective, Safe, Accessible Aspirin 81 mg daily -Appropriate, Effective, Safe, Accessible Clopidogrel 75 mg daily -Appropriate, Effective, Safe, Accessible OTC fish oil 1000 mg daily -Appropriate, Effective, Safe, Accessible -Current dietary patterns: "whatever my wife makes" -Current exercise habits: walking, yard work -Educated on [employer]; Benefits of statin for ASCVD risk reduction; benefits of antiplatelet therapy for maintaining stent patency -Recommended to continue current medication  Heart Failure / HTN (Goal: BP < 140/90, and prevent exacerbations) -Controlled- pt endorses compliance with medications as prescribed; BP at home is generally at goal per patient report -Pt is able to use $10 copay card for Entresto -Last ejection fraction: 40-45% (Date: 07/21/20) -HF type: Systolic; NYHA Class: II (slight limitation of activity) -Current home BP/HR readings: 125/60 -Current treatment: Furosemide 20 mg daily -Appropriate, Effective, Safe, Accessible Metoprolol succinate 50 mg daily -Appropriate, Effective, Safe, Accessible Entresto 24-26 mg BID -Appropriate, Effective, Safe, Accessible Spironolactone 25 mg - 1/2 tab daily -Appropriate, Effective, Safe, Accessible -Medications previously tried: lisinopril  -Educated on Benefits of medications for managing symptoms and prolonging life;  Importance of blood pressure control; explained the difference between spironolactone and furosemide as pt was confused about them both being diuretics -Recommended to continue current medication  Tobacco use (Goal: quit smoking) -Uncontrolled - pt is not ready quit; currently smoking ~1  ppd -Previous quit attempts: Chantix (recalled), bupropion (dry mouth) -On a scale of 1-10, reports MOTIVATION to quit is 0 -On a scale of 1-10, reports CONFIDENCE in quitting is 0 -Counseled on risks of continued smoking and benefits of cessation; offered help if patient changes his mind in the  future  GERD (Goal: manage symptoms) -Controlled - pt reports stomach symptoms are under control; he stopped taking pantoprazole regularly 6 months ago and denies issues -Current treatment  Pantoprazole 40 mg daily - removed from list Famotidine 20 mg daily HS - Appropriate, Effective, Safe, Accessible -Recommend to continue current medication  RLS (Goal: manage symptoms) -Controlled - pt reports RLS symptoms are somewhat worse, sometimes he takes extra 1/2 dose ~3am after symptoms wake him up -Current treatment  Ropinirole 3 mg - 1/2 tab AM and 1 tab HS Ferrous sulfate 325 mg daily -Explained pt is on maximum dose of ropinirole (manufacturer does not recommend exceeding 4 mg/day); other options for RLS include gabapentin or pregabalin; pt is ok with continuing ropinirole for now and will contact PCP if he wishes to change treatment  Health Maintenance -Vaccine gaps: Shingrix, covid booster -Counseled on Covid booster, Shingrix. Advised he get vaccines at local pharmacy.  Patient Goals/Self-Care Activities Patient will:  - take medications as prescribed focus on medication adherence by routine -check blood pressure 2-3x a week, document, and provide at future appointments -Can contact PCP if interested in changing restless leg treatment       Medication Assistance:  Entresto $10 copay card  Compliance/Adherence/Medication fill history: Care Gaps: Hep C screening  Star-Rating Drugs: Ezetimibe-Simvastatin 10-40 mg        04/20/2021      90; PDC 100%     Sacubitril-Valsartan 24-26 mg            02/20/2021      90 Lisinopril 5 mg                                     05/06/2020      90;  [place] inaccurate  Patient's preferred pharmacy is:  [id number] [place] [address]Phone: [phone]: [phone] Uses pill box? Yes Pt endorses 100% compliance  We discussed: Current pharmacy is preferred with insurance plan and patient is satisfied with pharmacy services Patient decided to: Continue current medication management strategy  Care Plan and Follow Up Patient Decision:  Patient agrees to Care Plan and Follow-up.  Plan: Telephone follow up appointment with care management team member scheduled for:  1 year  LCharlene Brooke PharmD, [place] CPP Clinical Pharmacist [facility] [phone]

## 2021-04-29 NOTE — Patient Instructions (Signed)
Visit Information  Phone number for Pharmacist: [phone]   Goals Addressed             This Visit's Progress    Track and Manage My Blood Pressure-Hypertension       Timeframe:  Long-Range Goal Priority:  High Start Date:         10/29/20                    Expected End Date:    04/29/22                 Follow Up Date Feb 2024   - check blood pressure 3 times per week - choose a place to take my blood pressure (home, clinic or office, retail store) - write blood pressure results in a log or diary    Why is this important?   You won't feel high blood pressure, but it can still hurt your blood vessels.  High blood pressure can cause heart or kidney problems. It can also cause a stroke.  Making lifestyle changes like losing a little weight or eating less salt will help.  Checking your blood pressure at home and at different times of the day can help to control blood pressure.  If the doctor prescribes medicine remember to take it the way the doctor ordered.  Call the office if you cannot afford the medicine or if there are questions about it.     Notes:         Care Plan : [place]  Updates made by Charlton Haws, RPH since 04/29/2021 12:00 AM     Problem: Hypertension, Hyperlipidemia, Heart Failure, Coronary Artery Disease, GERD, Tobacco use, RLS   Priority: High     Long-Range Goal: Disease management   Start Date: 10/29/2020  Expected End Date: 10/29/2021  Recent Progress: On track  Priority: High  Note:   Current Barriers:  Unable to independently monitor therapeutic efficacy  Pharmacist Clinical Goal(s):  Patient will achieve adherence to monitoring guidelines and medication adherence to achieve therapeutic efficacy through collaboration with PharmD and provider.   Interventions: 1:1 collaboration with [name], MD regarding development and update of comprehensive plan of care as evidenced by provider attestation and  co-signature Inter-disciplinary care team collaboration (see longitudinal plan of care) Comprehensive medication review performed; medication list updated in electronic medical record  Hyperlipidemia / CAD (LDL goal < 70) -Controlled - LDL is at goal; pt endorses compliance with medications as prescribed; he denies bleeding issues; he has never had to use NTG -Hx NSTEMI 2015, intermittent claudication, bilateral carotid artery stenosis -Current treatment: Ezetimibe-simvastatin 10-40 mg daily HS - Appropriate, Effective, Safe, Accessible Nitroglycerin 0.4 mg SL prn -Appropriate, Effective, Safe, Accessible Aspirin 81 mg daily -Appropriate, Effective, Safe, Accessible Clopidogrel 75 mg daily -Appropriate, Effective, Safe, Accessible OTC fish oil 1000 mg daily -Appropriate, Effective, Safe, Accessible -Current dietary patterns: "whatever my wife makes" -Current exercise habits: walking, yard work -Educated on [employer]; Benefits of statin for ASCVD risk reduction; benefits of antiplatelet therapy for maintaining stent patency -Recommended to continue current medication  Heart Failure / HTN (Goal: BP < 140/90, and prevent exacerbations) -Controlled- pt endorses compliance with medications as prescribed; BP at home is generally at goal per patient report -Pt is able to use $10 copay card for Entresto -Last ejection fraction: 40-45% (Date: 07/21/20) -HF type: Systolic; NYHA Class: II (slight limitation of activity) -Current home BP/HR readings: 125/60 -Current treatment: Furosemide  20 mg daily -Appropriate, Effective, Safe, Accessible Metoprolol succinate 50 mg daily -Appropriate, Effective, Safe, Accessible Entresto 24-26 mg BID -Appropriate, Effective, Safe, Accessible Spironolactone 25 mg - 1/2 tab daily -Appropriate, Effective, Safe, Accessible -Medications previously tried: lisinopril  -Educated on Benefits of medications for managing symptoms and prolonging life;  Importance of  blood pressure control; explained the difference between spironolactone and furosemide as pt was confused about them both being diuretics -Recommended to continue current medication  Tobacco use (Goal: quit smoking) -Uncontrolled - pt is not ready quit; currently smoking ~1 ppd -Previous quit attempts: Chantix (recalled), bupropion (dry mouth) -On a scale of 1-10, reports MOTIVATION to quit is 0 -On a scale of 1-10, reports CONFIDENCE in quitting is 0 -Counseled on risks of continued smoking and benefits of cessation; offered help if patient changes his mind in the future  GERD (Goal: manage symptoms) -Controlled - pt reports stomach symptoms are under control; he stopped taking pantoprazole regularly 6 months ago and denies issues -Current treatment  Pantoprazole 40 mg daily - removed from list Famotidine 20 mg daily HS - Appropriate, Effective, Safe, Accessible -Recommend to continue current medication  RLS (Goal: manage symptoms) -Controlled - pt reports RLS symptoms are somewhat worse, sometimes he takes extra 1/2 dose ~3am after symptoms wake him up -Current treatment  Ropinirole 3 mg - 1/2 tab AM and 1 tab HS Ferrous sulfate 325 mg daily -Explained pt is on maximum dose of ropinirole (manufacturer does not recommend exceeding 4 mg/day); other options for RLS include gabapentin or pregabalin; pt is ok with continuing ropinirole for now and will contact PCP if he wishes to change treatment  Health Maintenance -Vaccine gaps: Shingrix, covid booster -Counseled on Covid booster, Shingrix. Advised he get vaccines at local pharmacy.  Patient Goals/Self-Care Activities Patient will:  - take medications as prescribed focus on medication adherence by routine -check blood pressure 2-3x a week, document, and provide at future appointments -Can contact PCP if interested in changing restless leg treatment      Patient verbalizes understanding of instructions and care plan provided today  and agrees to view in [place]. Active MyChart status confirmed with patient.   Telephone follow up appointment with pharmacy team member scheduled for: 1 year  Charlene Brooke, PharmD, [facility] Clinical Pharmacist [facility] at [facility] [phone]

## 2021-05-12 DIAGNOSIS — E785 Hyperlipidemia, unspecified: Secondary | ICD-10-CM

## 2021-05-12 DIAGNOSIS — I5020 Unspecified systolic (congestive) heart failure: Secondary | ICD-10-CM

## 2021-05-12 DIAGNOSIS — F17210 Nicotine dependence, cigarettes, uncomplicated: Secondary | ICD-10-CM

## 2021-05-12 DIAGNOSIS — I110 Hypertensive heart disease with heart failure: Secondary | ICD-10-CM

## 2021-05-12 DIAGNOSIS — I2510 Atherosclerotic heart disease of native coronary artery without angina pectoris: Secondary | ICD-10-CM

## 2021-06-16 ENCOUNTER — Ambulatory Visit
Admission: RE | Admit: 2021-06-16 | Discharge: 2021-06-16 | Disposition: A | Discharge status: Home / Self Care | Payer: Medicare Other | Source: Ambulatory Visit | Attending: Acute Care | Admitting: Acute Care

## 2021-06-16 DIAGNOSIS — R918 Other nonspecific abnormal finding of lung field: Secondary | ICD-10-CM

## 2021-06-16 DIAGNOSIS — R911 Solitary pulmonary nodule: Secondary | ICD-10-CM | POA: Diagnosis not present

## 2021-06-25 ENCOUNTER — Telehealth: Payer: Self-pay | Admitting: Family Medicine

## 2021-06-25 DIAGNOSIS — I2510 Atherosclerotic heart disease of native coronary artery without angina pectoris: Secondary | ICD-10-CM

## 2021-06-25 DIAGNOSIS — R7303 Prediabetes: Secondary | ICD-10-CM

## 2021-06-25 NOTE — Telephone Encounter (Signed)
-----   Message from Velna Hatchet, RT sent at 06/22/2021  9:12 AM EDT ----- ?Regarding: Lab Tue 07/07/21 ?Patient is scheduled for cpx, please order future labs.  Thanks, Anda Kraft  ? ?

## 2021-07-07 ENCOUNTER — Other Ambulatory Visit (INDEPENDENT_AMBULATORY_CARE_PROVIDER_SITE_OTHER): Payer: Medicare Other

## 2021-07-07 DIAGNOSIS — Z9861 Coronary angioplasty status: Secondary | ICD-10-CM | POA: Diagnosis not present

## 2021-07-07 DIAGNOSIS — I2510 Atherosclerotic heart disease of native coronary artery without angina pectoris: Secondary | ICD-10-CM | POA: Diagnosis not present

## 2021-07-07 DIAGNOSIS — R7303 Prediabetes: Secondary | ICD-10-CM | POA: Diagnosis not present

## 2021-07-07 LAB — LIPID PANEL
Cholesterol: 113 mg/dL (ref 0–200)
HDL: 49.1 mg/dL (ref >39.00)
LDL Cholesterol: 44 mg/dL (ref 0–99)
NonHDL: 64.16
Total CHOL/HDL Ratio: 2
Triglycerides: 102 mg/dL (ref 0.0–149.0)
VLDL: 20.4 mg/dL (ref 0.0–40.0)

## 2021-07-07 LAB — COMPREHENSIVE METABOLIC PANEL
ALT: 8 U/L (ref 0–53)
AST: 12 U/L (ref 0–37)
Albumin: 3.8 g/dL (ref 3.5–5.2)
Alkaline Phosphatase: 60 U/L (ref 39–117)
BUN: 18 mg/dL (ref 6–23)
CO2: 27 mEq/L (ref 19–32)
Calcium: 8.6 mg/dL (ref 8.4–10.5)
Chloride: 102 mEq/L (ref 96–112)
Creatinine, Ser: 0.89 mg/dL (ref 0.40–1.50)
GFR: 81.19 mL/min (ref >60.00)
Glucose, Bld: 84 mg/dL (ref 70–99)
Potassium: 4.7 mEq/L (ref 3.5–5.1)
Sodium: 135 mEq/L (ref 135–145)
Total Bilirubin: 0.5 mg/dL (ref 0.2–1.2)
Total Protein: 7.1 g/dL (ref 6.0–8.3)

## 2021-07-07 LAB — HEMOGLOBIN A1C: Hgb A1c MFr Bld: 6 % (ref 4.6–6.5)

## 2021-07-07 NOTE — Progress Notes (Signed)
No critical labs need to be addressed urgently. We will discuss labs in detail at upcoming office visit.   

## 2021-07-14 ENCOUNTER — Encounter: Payer: Self-pay | Admitting: Family Medicine

## 2021-07-14 ENCOUNTER — Ambulatory Visit (INDEPENDENT_AMBULATORY_CARE_PROVIDER_SITE_OTHER): Payer: Medicare Other | Admitting: Family Medicine

## 2021-07-14 VITALS — BP 140/58 | HR 61 | Temp 97.7°F | Ht 66.0 in | Wt 119.5 lb

## 2021-07-14 DIAGNOSIS — J479 Bronchiectasis, uncomplicated: Secondary | ICD-10-CM

## 2021-07-14 DIAGNOSIS — J449 Chronic obstructive pulmonary disease, unspecified: Secondary | ICD-10-CM | POA: Diagnosis not present

## 2021-07-14 DIAGNOSIS — I714 Abdominal aortic aneurysm, without rupture, unspecified: Secondary | ICD-10-CM

## 2021-07-14 DIAGNOSIS — R1032 Left lower quadrant pain: Secondary | ICD-10-CM | POA: Diagnosis not present

## 2021-07-14 DIAGNOSIS — I10 Essential (primary) hypertension: Secondary | ICD-10-CM

## 2021-07-14 DIAGNOSIS — I6523 Occlusion and stenosis of bilateral carotid arteries: Secondary | ICD-10-CM | POA: Diagnosis not present

## 2021-07-14 DIAGNOSIS — R634 Abnormal weight loss: Secondary | ICD-10-CM | POA: Diagnosis not present

## 2021-07-14 DIAGNOSIS — E785 Hyperlipidemia, unspecified: Secondary | ICD-10-CM

## 2021-07-14 DIAGNOSIS — Z0000 Encounter for general adult medical examination without abnormal findings: Secondary | ICD-10-CM | POA: Diagnosis not present

## 2021-07-14 MED ORDER — MIRTAZAPINE 15 MG PO TABS: 15.0000 mg | ORAL_TABLET | Freq: Every day | ORAL | 11 refills | Status: DC

## 2021-07-14 NOTE — Assessment & Plan Note (Signed)
Chronic, well controlled on Vytorin.  LDL at goal less than 70 ?

## 2021-07-14 NOTE — Assessment & Plan Note (Signed)
Chronic, stable ? ?Encouraged smoking cessation.  Followed by pulmonary ?

## 2021-07-14 NOTE — Assessment & Plan Note (Signed)
Acute, likely due to decreased appetite.  He denies depression or anxiety.  He does find he has more difficulty eating with his new dentures.  I will start Remeron 15 mg at bedtime to stimulate appetite. ? ?His age and smoking history if weight is not improving with improved appetite and p.o. intake, we can consider full cancer work-up. ?

## 2021-07-14 NOTE — Assessment & Plan Note (Addendum)
Acute on chronic ? ?No clear sign of hernia.  Area of tenderness appears to be associated with scar tissue from previous hernia vs. abdominal wall muscle strain ?If pain not improving we can consider further imaging versus referral to surgery for further evaluation. ?

## 2021-07-14 NOTE — Assessment & Plan Note (Signed)
Followed by pulmonary 

## 2021-07-14 NOTE — Assessment & Plan Note (Signed)
Stable, chronic.  Continue current medication.   Metoprolol XL 50 mg p.o. daily, Lasix 20 mg daily, Entresto 24/26 mg 2 times daily, spironolactone half tablet p.o. daily, lisinopril 5 mg p.o. daily 

## 2021-07-14 NOTE — Assessment & Plan Note (Signed)
Chronic, followed by vascular. ? ?on recent CT 06/2021: Infrarenal abdominal aortic aneurysm, 4.3 cm AA in its visualized ?portion on today's exam. This is a known finding from prior CT ?abdomen and is at least partially traversed by an aortic stent. ?Recommendations for follow up applies to untreated aneurysms and do ?not apply in this setting, surveillance of the abdominal aortic ?aneurysm is deferred to vascular specialist preference. ?

## 2021-07-14 NOTE — Assessment & Plan Note (Signed)
Status post CEA.  Followed by vascular. ? ?LDL goal less than 70. ?

## 2021-07-14 NOTE — Progress Notes (Signed)
? ? Patient ID: Erik [name]., male    DOB: Aug 13, 1940, 81 y.o.   MRN: [id number] ? ?This visit was conducted in person. ? ?BP (!) 140/58   Pulse 61   Temp 97.7 ?F (36.5 ?C) (Oral)   Ht '5\' 6"'$  (1.676 m)   Wt 119 lb 8 oz (54.2 kg)   SpO2 97%   BMI 19.29 kg/m?   ? ?CC:  ?Chief Complaint  ?Patient presents with  ? Medicare Wellness  ? ? ?Subjective:  ? ?HPI: ?Erik Gernert. is a 81 y.o. male presenting on 07/14/2021 for Medicare Wellness ? ?The patient presents for annual medicare wellness, complete physical and review of chronic health problems. He/She also has the following acute concerns today: groin pain and  weight loss. ? ?I have personally reviewed the Medicare Annual Wellness questionnaire and have noted ?1. The patient's medical and social history ?2. Their use of alcohol, tobacco or illicit drugs ?3. Their current medications and supplements ?4. The patient's functional ability including ADL's, fall risks, home safety risks and hearing or visual ?            impairment. ?5. Diet and physical activities ?6. Evidence for depression or mood disorders ?7.         Updated provider list ?Cognitive evaluation was performed and recorded on pt medicare questionnaire form. ?The patients weight, height, BMI and visual acuity have been recorded in the chart   ?I have made referrals, counseling and provided education to the patient based review of the above and I have provided the pt with a written personalized care plan for preventive services.  ? Documentation of this information was scanned into the electronic record under the media tab. ?. ? Advance directives and end of life planning reviewed in detail with patient and documented in EMR. Patient given handout on advance care directives if needed. HCPOA and living will updated if needed. ? ?Hearing Screening  ?Method: Audiometry  ? '500Hz'$  '1000Hz'$  '2000Hz'$  '4000Hz'$   ?Right ear 40 0 0 0  ?Left ear 40 40 0 0  ?Comments: Eye Exam with Dr. George Ina at [facility] 4 months ago  ? ?No falls in last 12 months. ? ? ?  07/14/2021  ?  9:44 AM 07/10/2020  ? 11:03 AM 05/31/2019  ? 10:36 AM  ?Depression screen PHQ 2/9  ?Decreased Interest 0 0 0  ?Down, Depressed, Hopeless 0 0 0  ?PHQ - 2 Score 0 0 0  ?Altered sleeping   0  ?Tired, decreased energy   0  ?Change in appetite   0  ?Feeling bad or failure about yourself    0  ?Trouble concentrating   0  ?Moving slowly or fidgety/restless   0  ?Suicidal thoughts   0  ?PHQ-9 Score   0  ?Difficult doing work/chores   Not difficult at all  ? ? ?  Has new teeth and takes a while to chew. Poor taste of food. ? Decreased appetite. ?Wt Readings from Last 3 Encounters:  ?07/14/21 119 lb 8 oz (54.2 kg)  ?04/02/21 122 lb (55.3 kg)  ?12/23/20 125 lb 9.6 oz (57 kg)  ? ?Left groin pain intermittently , randomly  sharp. ?Has had chronic pain in left groin, LLQ since hernia repeat > 10 years ago. ? No diarrhea, no constipation. No dyuria. ?  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 This is a known finding  from prior CT ?abdomen and is at least partially traversed by an aortic stent. ?Recommendations for follow up applies to untreated aneurysms and do ?not apply in this setting, surveillance of the abdominal aortic ?aneurysm is deferred to vascular specialist preference. ? ?Elevated Cholesterol: LDL at goal on Vytorin. ?Using medications without problems: ?Muscle aches:  ?Diet compliance: ?Exercise: ?Other complaints: ? ? ?   ? ?Relevant past medical, surgical, family and social history reviewed and updated as indicated. Interim medical history since our last visit reviewed. ?Allergies and medications reviewed and updated. ?Outpatient Medications Prior to Visit  ?Medication Sig Dispense Refill  ? acetaminophen (TYLENOL) 500 MG tablet Take 500 mg by mouth every 6 (six) hours as needed (for pain.).    ? aspirin EC 81 MG tablet Take 81 mg by mouth every evening.     ?  clopidogrel (PLAVIX) 75 MG tablet TAKE 1 TABLET BY MOUTH EVERY DAY 90 tablet 3  ? ezetimibe-simvastatin (VYTORIN) 10-40 MG tablet TAKE 1 TABLET BY MOUTH AT BEDTIME 90 tablet 3  ? famotidine (PEPCID) 20 MG tablet Take 1 tablet (20 mg total) by mouth at bedtime. 1 tablet 0  ? furosemide (LASIX) 20 MG tablet TAKE 1 TABLET BY MOUTH EVERY DAY 90 tablet 3  ? metoprolol succinate (TOPROL-XL) 50 MG 24 hr tablet TAKE 1 TABLET (50 MG TOTAL) BY MOUTH DAILY. TAKE WITH OR IMMEDIATELY FOLLOWING A MEAL. 90 tablet 3  ? nitroGLYCERIN (NITROSTAT) 0.4 MG SL tablet Place 1 tablet (0.4 mg total) under the tongue every 5 (five) minutes x 3 doses as needed for chest pain. 30 tablet 3  ? Omega-3 Fatty Acids (FISH OIL) 1000 MG CAPS Take 1 capsule by mouth daily.    ? rOPINIRole (REQUIP) 3 MG tablet TAKE 1/2 TABLET BY MOUTH IN THE MORNING AND 1 TABLET AT NIGHT 135 tablet 1  ? sacubitril-valsartan (ENTRESTO) 24-26 MG Take 1 tablet by mouth 2 (two) times daily. 180 tablet 3  ? spironolactone (ALDACTONE) 25 MG tablet TAKE 1/2 TABLET BY MOUTH EVERY DAY 45 tablet 2  ? tamsulosin (FLOMAX) 0.4 MG CAPS capsule TAKE 1 CAPSULE BY MOUTH EVERY DAY 90 capsule 3  ? lisinopril (ZESTRIL) 5 MG tablet Take 1 tablet (5 mg total) by mouth daily. NEEDS FOLLOW UP APPOINTMENT FOR MORE REFILLS (Patient not taking: Reported on 07/14/2021) 30 tablet 0  ? ferrous sulfate 325 (65 FE) MG tablet TAKE 1 TABLET BY MOUTH EVERY DAY WITH BREAKFAST 90 tablet 0  ? ?No facility-administered medications prior to visit.  ?  ? ?Per HPI unless specifically indicated in ROS section below ?Review of Systems  ?Constitutional:  Positive for unexpected weight change. Negative for fatigue and fever.  ?HENT:  Negative for ear pain.   ?Eyes:  Negative for pain.  ?Respiratory:  Negative for cough and shortness of breath.   ?Cardiovascular:  Negative for chest pain, palpitations and leg swelling.  ?Gastrointestinal:  Negative for abdominal pain.  ?Genitourinary:  Negative for dysuria.   ?Musculoskeletal:  Negative for arthralgias.  ?Neurological:  Negative for syncope, light-headedness and headaches.  ?Psychiatric/Behavioral:  Negative for dysphoric mood.   ?Objective:  ?BP (!) 140/58   Pulse 61   Temp 97.7 ?F (36.5 ?C) (Oral)   Ht '5\' 6"'$  (1.676 m)   Wt 119 lb 8 oz (54.2 kg)   SpO2 97%   BMI 19.29 kg/m?   ?Wt Readings from Last 3 Encounters:  ?07/14/21 119 lb 8 oz (54.2 kg)  ?04/02/21 122 lb (55.3 kg)  ?12/23/20  125 lb 9.6 oz (57 kg)  ?  ?  ?Physical Exam ?Constitutional:   ?   General: He is not in acute distress. ?   Appearance: Normal appearance. He is well-developed. He is not ill-appearing or toxic-appearing.  ?HENT:  ?   Head: Normocephalic and atraumatic.  ?   Right Ear: Hearing, tympanic membrane, ear canal and external ear normal.  ?   Left Ear: Hearing, tympanic membrane, ear canal and external ear normal.  ?   Nose: Nose normal.  ?   Mouth/Throat:  ?   Pharynx: Uvula midline.  ?Eyes:  ?   General: Lids are normal. Lids are everted, no foreign bodies appreciated.  ?   Conjunctiva/sclera: Conjunctivae normal.  ?   Pupils: Pupils are equal, round, and reactive to light.  ?Neck:  ?   Thyroid: No thyroid mass or thyromegaly.  ?   Vascular: No carotid bruit.  ?   Trachea: Trachea and phonation normal.  ?Cardiovascular:  ?   Rate and Rhythm: Normal rate and regular rhythm.  ?   Pulses: Normal pulses.  ?   Heart sounds: S1 normal and S2 normal. No murmur heard. ?  No gallop.  ?Pulmonary:  ?   Breath sounds: Normal breath sounds. No wheezing, rhonchi or rales.  ?Abdominal:  ?   General: Bowel sounds are normal.  ?   Palpations: Abdomen is soft.  ?   Tenderness: There is no abdominal tenderness. There is no guarding or rebound.  ?   Hernia: No hernia is present. There is no hernia in the umbilical area, ventral area, left inguinal area, right femoral area, left femoral area or right inguinal area.  ?   Comments: Scar tissue palpated under obvious scar in left groin, no redness no swelling   ?Musculoskeletal:  ?   Cervical back: Normal range of motion and neck supple.  ?Lymphadenopathy:  ?   Cervical: No cervical adenopathy.  ?Skin: ?   General: Skin is warm and dry.  ?   Findings: No rash.  ?Neurological:  ?   M

## 2021-07-14 NOTE — Patient Instructions (Addendum)
Start trial of  Remeron for appetite. ? Work on increasing protein and calories in diet. Do not skip meals. ? Consider BOOST/ENSURE supplement in addiiton to meals. ?

## 2021-07-17 ENCOUNTER — Other Ambulatory Visit: Payer: Self-pay | Admitting: Family Medicine

## 2021-07-24 ENCOUNTER — Telehealth: Payer: Self-pay | Admitting: Family Medicine

## 2021-07-24 NOTE — Telephone Encounter (Signed)
Left message for patient to call back and schedule Medicare Annual Wellness Visit (AWV) either virtually or phone ? ? ?Last AWV4/28/22 ? ; please schedule at anytime with health coach ? ?I left my direct # [phone] ? ? ?

## 2021-07-27 NOTE — Telephone Encounter (Signed)
Spoke with patient.  He stated he has this done with dr [name] 5/2/rbh ?

## 2021-08-06 ENCOUNTER — Other Ambulatory Visit: Payer: Self-pay | Admitting: Family Medicine

## 2021-08-14 ENCOUNTER — Ambulatory Visit (INDEPENDENT_AMBULATORY_CARE_PROVIDER_SITE_OTHER): Payer: Medicare Other | Admitting: Family Medicine

## 2021-08-14 ENCOUNTER — Encounter: Payer: Self-pay | Admitting: Family Medicine

## 2021-08-14 VITALS — BP 130/50 | HR 70 | Temp 97.9°F | Ht 66.0 in | Wt 120.5 lb

## 2021-08-14 DIAGNOSIS — I6523 Occlusion and stenosis of bilateral carotid arteries: Secondary | ICD-10-CM

## 2021-08-14 DIAGNOSIS — R630 Anorexia: Secondary | ICD-10-CM

## 2021-08-14 DIAGNOSIS — R634 Abnormal weight loss: Secondary | ICD-10-CM

## 2021-08-14 NOTE — Patient Instructions (Addendum)
Increase the Remeron to 2 tabs of 15 mg at bedtime to stimulate appetite.  Call  if your appetite is not improving or if weight loss recurs.

## 2021-08-14 NOTE — Progress Notes (Signed)
Patient ID: Erik [name]., male    DOB: May 21, 1940, 81 y.o.   MRN: [id number]  This visit was conducted in person.  BP (!) 130/50   Pulse 70   Temp 97.9 F (36.6 C) (Oral)   Ht '5\' 6"'$  (1.676 m)   Wt 120 lb 8 oz (54.7 kg)   SpO2 98%   BMI 19.45 kg/m    CC:  Chief Complaint  Patient presents with   Follow-up    Weigh Loss and Remeron Start    Subjective:   HPI: Erik [name]. is a 81 y.o. male presenting on 08/14/2021 for Follow-up (Weigh Loss and Remeron Start)   At last OV 07/14/2021.. started on low dose Remeron at bedtime given decreased appetite.  No SE, but no improvement in appetite.  No abd pain, no N/V.  He denies depression, anxiety. No insomnia  Wt Readings from Last 3 Encounters:  08/14/21 120 lb 8 oz (54.7 kg)  07/14/21 119 lb 8 oz (54.2 kg)  04/02/21 122 lb (55.3 kg)   Body mass index is 19.45 kg/m.  He reports he eats minimal amount of food each day given his lack of appetite.      Relevant past medical, surgical, family and social history reviewed and updated as indicated. Interim medical history since our last visit reviewed. Allergies and medications reviewed and updated. Outpatient Medications Prior to Visit  Medication Sig Dispense Refill   acetaminophen (TYLENOL) 500 MG tablet Take 500 mg by mouth every 6 (six) hours as needed (for pain.).     aspirin EC 81 MG tablet Take 81 mg by mouth every evening.      clopidogrel (PLAVIX) 75 MG tablet TAKE 1 TABLET BY MOUTH EVERY DAY 90 tablet 3   ezetimibe-simvastatin (VYTORIN) 10-40 MG tablet TAKE 1 TABLET BY MOUTH EVERYDAY AT BEDTIME 90 tablet 3   famotidine (PEPCID) 20 MG tablet Take 1 tablet (20 mg total) by mouth at bedtime. 1 tablet 0   furosemide (LASIX) 20 MG tablet TAKE 1 TABLET BY MOUTH EVERY DAY 90 tablet 3   metoprolol succinate (TOPROL-XL) 50 MG 24 hr tablet TAKE 1 TABLET (50 MG TOTAL) BY MOUTH DAILY. TAKE WITH OR IMMEDIATELY FOLLOWING A MEAL. 90 tablet 3   mirtazapine (REMERON)  15 MG tablet TAKE 1 TABLET BY MOUTH EVERYDAY AT BEDTIME 90 tablet 3   nitroGLYCERIN (NITROSTAT) 0.4 MG SL tablet Place 1 tablet (0.4 mg total) under the tongue every 5 (five) minutes x 3 doses as needed for chest pain. 30 tablet 3   Omega-3 Fatty Acids (FISH OIL) 1000 MG CAPS Take 1 capsule by mouth daily.     rOPINIRole (REQUIP) 3 MG tablet TAKE 1/2 TABLET BY MOUTH IN THE MORNING AND 1 TABLET AT NIGHT 135 tablet 1   sacubitril-valsartan (ENTRESTO) 24-26 MG Take 1 tablet by mouth 2 (two) times daily. 180 tablet 3   spironolactone (ALDACTONE) 25 MG tablet TAKE 1/2 TABLET BY MOUTH EVERY DAY 45 tablet 2   tamsulosin (FLOMAX) 0.4 MG CAPS capsule TAKE 1 CAPSULE BY MOUTH EVERY DAY 90 capsule 3   lisinopril (ZESTRIL) 5 MG tablet Take 1 tablet (5 mg total) by mouth daily. NEEDS FOLLOW UP APPOINTMENT FOR MORE REFILLS (Patient not taking: Reported on 07/14/2021) 30 tablet 0   No facility-administered medications prior to visit.     Per HPI unless specifically indicated in ROS section below Review of Systems  Constitutional:  Positive for fatigue and unexpected weight change. Negative  for fever.  HENT:  Negative for ear pain.   Eyes:  Negative for pain.  Respiratory:  Negative for cough and shortness of breath.   Cardiovascular:  Negative for chest pain, palpitations and leg swelling.  Gastrointestinal:  Negative for abdominal pain.  Genitourinary:  Negative for dysuria.  Musculoskeletal:  Negative for arthralgias.  Neurological:  Negative for syncope, light-headedness and headaches.  Psychiatric/Behavioral:  Negative for dysphoric mood.    Objective:  BP (!) 130/50   Pulse 70   Temp 97.9 F (36.6 C) (Oral)   Ht '5\' 6"'$  (1.676 m)   Wt 120 lb 8 oz (54.7 kg)   SpO2 98%   BMI 19.45 kg/m   Wt Readings from Last 3 Encounters:  08/14/21 120 lb 8 oz (54.7 kg)  07/14/21 119 lb 8 oz (54.2 kg)  04/02/21 122 lb (55.3 kg)      Physical Exam Constitutional:      Appearance: He is well-developed and  underweight.  HENT:     Head: Normocephalic.     Right Ear: Hearing normal.     Left Ear: Hearing normal.     Nose: Nose normal.  Neck:     Thyroid: No thyroid mass or thyromegaly.     Vascular: No carotid bruit.     Trachea: Trachea normal.  Cardiovascular:     Rate and Rhythm: Normal rate and regular rhythm.     Pulses: Normal pulses.     Heart sounds: Heart sounds not distant. No murmur heard.    No friction rub. No gallop.     Comments: No peripheral edema Pulmonary:     Effort: Pulmonary effort is normal. No respiratory distress.     Breath sounds: Normal breath sounds.  Skin:    General: Skin is warm and dry.     Findings: No rash.  Psychiatric:        Speech: Speech normal.        Behavior: Behavior normal.        Thought Content: Thought content normal.       Results for orders placed or performed in visit on 07/07/21  Comprehensive metabolic panel  Result Value Ref Range   Sodium 135 135 - 145 mEq/L   Potassium 4.7 3.5 - 5.1 mEq/L   Chloride 102 96 - 112 mEq/L   CO2 27 19 - 32 mEq/L   Glucose, Bld 84 70 - 99 mg/dL   BUN 18 6 - 23 mg/dL   Creatinine, Ser 0.89 0.40 - 1.50 mg/dL   Total Bilirubin 0.5 0.2 - 1.2 mg/dL   Alkaline Phosphatase 60 39 - 117 U/L   AST 12 0 - 37 U/L   ALT 8 0 - 53 U/L   Total Protein 7.1 6.0 - 8.3 g/dL   Albumin 3.8 3.5 - 5.2 g/dL   GFR 81.19 >60.00 mL/min   Calcium 8.6 8.4 - 10.5 mg/dL  Lipid panel  Result Value Ref Range   Cholesterol 113 0 - 200 mg/dL   Triglycerides 102.0 0.0 - 149.0 mg/dL   HDL 49.10 >39.00 mg/dL   VLDL 20.4 0.0 - 40.0 mg/dL   LDL Cholesterol 44 0 - 99 mg/dL   Total CHOL/HDL Ratio 2    NonHDL 64.16   Hemoglobin A1c  Result Value Ref Range   Hgb A1c MFr Bld 6.0 4.6 - 6.5 %     COVID 19 screen:  No recent travel or known exposure to COVID19 The patient denies respiratory symptoms of COVID 19  at this time. The importance of social distancing was discussed today.   Assessment and Plan Problem List  Items Addressed This Visit     Abnormal weight loss - Primary   Decreased appetite   He has minimal symptoms at this point other than decreased appetite.  He denies depression or anxiety.  He has a history of GI issues but denies symptoms.  We will increase Remeron to night.  He will follow-up in 1 month.  If there is no weight gain we will consider further work-up for additional secondary causes.    [name], MD

## 2021-08-24 ENCOUNTER — Other Ambulatory Visit: Payer: Self-pay | Admitting: Family Medicine

## 2021-08-25 ENCOUNTER — Other Ambulatory Visit (HOSPITAL_COMMUNITY): Payer: Self-pay | Admitting: Cardiology

## 2021-09-10 NOTE — Telephone Encounter (Signed)
Called patient but he did not answer. Left message for him to call us back.  

## 2021-09-10 NOTE — Addendum Note (Signed)
Addended by: Maryjane Hurter on: 09/10/2021 12:43 PM   Modules accepted: Orders

## 2021-09-10 NOTE — Telephone Encounter (Signed)
Can we bring him back for appointment in 8 weeks with PFT? Would ideally need to come pick up sputum container for [employer] and return it ASAP

## 2021-09-23 ENCOUNTER — Other Ambulatory Visit: Payer: Self-pay | Admitting: Family Medicine

## 2021-09-23 ENCOUNTER — Other Ambulatory Visit (HOSPITAL_COMMUNITY): Payer: Self-pay | Admitting: Cardiology

## 2021-09-30 ENCOUNTER — Other Ambulatory Visit (HOSPITAL_COMMUNITY): Payer: Self-pay | Admitting: Cardiology

## 2021-10-09 ENCOUNTER — Other Ambulatory Visit: Payer: Self-pay | Admitting: Family Medicine

## 2021-10-09 ENCOUNTER — Ambulatory Visit (INDEPENDENT_AMBULATORY_CARE_PROVIDER_SITE_OTHER): Payer: Medicare Other | Admitting: Family Medicine

## 2021-10-09 ENCOUNTER — Encounter: Payer: Self-pay | Admitting: Family Medicine

## 2021-10-09 VITALS — BP 90/60 | HR 66 | Temp 98.5°F | Ht 66.0 in | Wt 114.0 lb

## 2021-10-09 DIAGNOSIS — D649 Anemia, unspecified: Secondary | ICD-10-CM

## 2021-10-09 DIAGNOSIS — R634 Abnormal weight loss: Secondary | ICD-10-CM

## 2021-10-09 DIAGNOSIS — Z8619 Personal history of other infectious and parasitic diseases: Secondary | ICD-10-CM

## 2021-10-09 DIAGNOSIS — I6523 Occlusion and stenosis of bilateral carotid arteries: Secondary | ICD-10-CM | POA: Diagnosis not present

## 2021-10-09 DIAGNOSIS — G2581 Restless legs syndrome: Secondary | ICD-10-CM | POA: Diagnosis not present

## 2021-10-09 DIAGNOSIS — R1013 Epigastric pain: Secondary | ICD-10-CM | POA: Diagnosis not present

## 2021-10-09 NOTE — Patient Instructions (Signed)
Please stop at the lab to have labs drawn.  

## 2021-10-09 NOTE — Progress Notes (Signed)
Patient ID: Erik Scarlet., male    DOB: [birth year], 81 y.o.   MRN: [id number]  This visit was conducted in person.  BP 90/60   Pulse 66   Temp 98.5 F (36.9 C) (Oral)   Ht '5\' 6"'$  (1.676 m)   Wt 114 lb (51.7 kg)   SpO2 95%   BMI 18.40 kg/m    CC:  Chief Complaint  Patient presents with  . Back Pain  . Weight Loss  . Abdominal Pain    Subjective:   HPI: Erik Garde. is a 81 y.o. male presenting on 10/09/2021 for Back Pain, Weight Loss, and Abdominal Pain  He has no appetite, eat very little each day. No better with 30 mg daily  of Remeron.  He is having abdominal  pain in central abdomen and in back... onoging x 2 months, worsening.  Pain comes and goes but occurs daily. Last 1 hour, occ keeping him up at night.  Improved with eating.  Has new plate for teeth that effects his taste buds.  No diarrhea, has constipation... strains with BMs, has BM every 2-3 day.  No blood in stool.  Pepcid AC controls the GERD.   Wt Readings from Last 3 Encounters:  10/09/21 114 lb (51.7 kg)  08/14/21 120 lb 8 oz (54.7 kg)  07/14/21 119 lb 8 oz (54.2 kg)   Body mass index is 18.4 kg/m.    Colonoscopy and endoscopy  at 09/2018 Hiatal hernia.  Hx of h. pylori. Treated in 2020 Dr. Hilarie Fredrickson   RLS, not well controlled with 3 mg requip.Marland Kitchen only last 1 hours.  Relevant past medical, surgical, family and social history reviewed and updated as indicated. Interim medical history since our last visit reviewed. Allergies and medications reviewed and updated. Outpatient Medications Prior to Visit  Medication Sig Dispense Refill  . acetaminophen (TYLENOL) 500 MG tablet Take 500 mg by mouth every 6 (six) hours as needed (for pain.).    Marland Kitchen aspirin EC 81 MG tablet Take 81 mg by mouth every evening.     . clopidogrel (PLAVIX) 75 MG tablet TAKE 1 TABLET BY MOUTH EVERY DAY 90 tablet 3  . ENTRESTO 24-26 MG TAKE 1 TABLET BY MOUTH TWICE A DAY 180 tablet 3  . ezetimibe-simvastatin (VYTORIN)  10-40 MG tablet TAKE 1 TABLET BY MOUTH EVERYDAY AT BEDTIME 90 tablet 3  . famotidine (PEPCID) 20 MG tablet Take 1 tablet (20 mg total) by mouth at bedtime. 1 tablet 0  . furosemide (LASIX) 20 MG tablet Take 1 tablet (20 mg total) by mouth daily. Please call for visit [phone] 30 tablet 1  . metoprolol succinate (TOPROL-XL) 50 MG 24 hr tablet Take 1 tablet (50 mg total) by mouth daily. NEEDS FOLLOW UP APPOINTMENT FOR ANYMORE REFILLS TAKE WITH OR IMMEDIATELY FOLLOWING A MEAL. 90 tablet 1  . nitroGLYCERIN (NITROSTAT) 0.4 MG SL tablet Place 1 tablet (0.4 mg total) under the tongue every 5 (five) minutes x 3 doses as needed for chest pain. 30 tablet 3  . Omega-3 Fatty Acids (FISH OIL) 1000 MG CAPS Take 1 capsule by mouth daily.    Marland Kitchen rOPINIRole (REQUIP) 3 MG tablet TAKE 1/2 TABLET BY MOUTH IN THE MORNING & TAKE 1 TABLET AT NIGHT 135 tablet 1  . spironolactone (ALDACTONE) 25 MG tablet TAKE 1/2 TABLET BY MOUTH EVERY DAY 45 tablet 2  . tamsulosin (FLOMAX) 0.4 MG CAPS capsule TAKE 1 CAPSULE BY MOUTH EVERY DAY 90 capsule  3  . mirtazapine (REMERON) 15 MG tablet TAKE 1 TABLET BY MOUTH EVERYDAY AT BEDTIME 90 tablet 3   No facility-administered medications prior to visit.     Per HPI unless specifically indicated in ROS section below Review of Systems Objective:  BP 90/60   Pulse 66   Temp 98.5 F (36.9 C) (Oral)   Ht '5\' 6"'$  (1.676 m)   Wt 114 lb (51.7 kg)   SpO2 95%   BMI 18.40 kg/m   Wt Readings from Last 3 Encounters:  10/09/21 114 lb (51.7 kg)  08/14/21 120 lb 8 oz (54.7 kg)  07/14/21 119 lb 8 oz (54.2 kg)      Physical Exam    Results for orders placed or performed in visit on 07/07/21  Comprehensive metabolic panel  Result Value Ref Range   Sodium 135 135 - 145 mEq/L   Potassium 4.7 3.5 - 5.1 mEq/L   Chloride 102 96 - 112 mEq/L   CO2 27 19 - 32 mEq/L   Glucose, Bld 84 70 - 99 mg/dL   BUN 18 6 - 23 mg/dL   Creatinine, Ser 0.89 0.40 - 1.50 mg/dL   Total Bilirubin 0.5 0.2 - 1.2  mg/dL   Alkaline Phosphatase 60 39 - 117 U/L   AST 12 0 - 37 U/L   ALT 8 0 - 53 U/L   Total Protein 7.1 6.0 - 8.3 g/dL   Albumin 3.8 3.5 - 5.2 g/dL   GFR 81.19 >60.00 mL/min   Calcium 8.6 8.4 - 10.5 mg/dL  Lipid panel  Result Value Ref Range   Cholesterol 113 0 - 200 mg/dL   Triglycerides 102.0 0.0 - 149.0 mg/dL   HDL 49.10 >39.00 mg/dL   VLDL 20.4 0.0 - 40.0 mg/dL   LDL Cholesterol 44 0 - 99 mg/dL   Total CHOL/HDL Ratio 2    NonHDL 64.16   Hemoglobin A1c  Result Value Ref Range   Hgb A1c MFr Bld 6.0 4.6 - 6.5 %     COVID 19 screen:  No recent travel or known exposure to COVID19 The patient denies respiratory symptoms of COVID 19 at this time. The importance of social distancing was discussed today.   Assessment and Plan     [name], MD

## 2021-10-11 DIAGNOSIS — R630 Anorexia: Secondary | ICD-10-CM | POA: Insufficient documentation

## 2021-10-11 DIAGNOSIS — R1013 Epigastric pain: Secondary | ICD-10-CM | POA: Insufficient documentation

## 2021-10-11 NOTE — Assessment & Plan Note (Signed)
Acute, in patient with history of H. pylori and hiatal hernia. We will move forward with evaluation with labs including liver function tests and lipase.  We will check a H. pylori breath test.  He will continue Pepcid daily for now.

## 2021-10-11 NOTE — Assessment & Plan Note (Signed)
Chronic, in part due to decreased appetite and poor p.o. intake.  No benefit from addition of Remeron 30 mg daily.  Given new addition of abdominal pain we will move forward with looking into secondary causes with labs. Wt Readings from Last 3 Encounters:  10/09/21 114 lb (51.7 kg)  08/14/21 120 lb 8 oz (54.7 kg)  07/14/21 119 lb 8 oz (54.2 kg)

## 2021-10-11 NOTE — Assessment & Plan Note (Signed)
Chronic, poor control despite maximum dose of Requip.  Have an initial improvement but at this point the Requip controls his symptoms only for about an hour.  I will evaluate for secondary causes of restless leg syndrome such as anemia, thyroid disease. If no easily correctable issue we may need to try a muscle relaxant in addition at that time.

## 2021-10-13 LAB — IRON,TIBC AND FERRITIN PANEL
%SAT: 11 % (calc) — ABNORMAL LOW (ref 20–48)
Ferritin: 40 ng/mL (ref 24–380)
Iron: 33 ug/dL — ABNORMAL LOW (ref 50–180)
TIBC: 311 mcg/dL (calc) (ref 250–425)

## 2021-10-14 ENCOUNTER — Encounter: Payer: Self-pay | Admitting: Family Medicine

## 2021-10-14 ENCOUNTER — Other Ambulatory Visit (HOSPITAL_COMMUNITY): Payer: Self-pay | Admitting: Cardiology

## 2021-10-14 LAB — CBC WITH DIFFERENTIAL/PLATELET
Absolute Monocytes: 821 cells/uL (ref 200–950)
Basophils Absolute: 81 cells/uL (ref 0–200)
Basophils Relative: 1.1 %
Eosinophils Absolute: 303 cells/uL (ref 15–500)
Eosinophils Relative: 4.1 %
HCT: 34.3 % — ABNORMAL LOW (ref 38.5–50.0)
Hemoglobin: 11.4 g/dL — ABNORMAL LOW (ref 13.2–17.1)
Lymphs Abs: 1421 cells/uL (ref 850–3900)
MCH: 29.4 pg (ref 27.0–33.0)
MCHC: 33.2 g/dL (ref 32.0–36.0)
MCV: 88.4 fL (ref 80.0–100.0)
MPV: 9.2 fL (ref 7.5–12.5)
Monocytes Relative: 11.1 %
Neutro Abs: 4773 cells/uL (ref 1500–7800)
Neutrophils Relative %: 64.5 %
Platelets: 318 10*3/uL (ref 140–400)
RBC: 3.88 10*6/uL — ABNORMAL LOW (ref 4.20–5.80)
RDW: 12.6 % (ref 11.0–15.0)
Total Lymphocyte: 19.2 %
WBC: 7.4 10*3/uL (ref 3.8–10.8)

## 2021-10-14 LAB — COMPREHENSIVE METABOLIC PANEL
AG Ratio: 1.1 (calc) (ref 1.0–2.5)
ALT: 7 U/L — ABNORMAL LOW (ref 9–46)
AST: 13 U/L (ref 10–35)
Albumin: 3.7 g/dL (ref 3.6–5.1)
Alkaline phosphatase (APISO): 61 U/L (ref 35–144)
BUN: 23 mg/dL (ref 7–25)
CO2: 28 mmol/L (ref 20–32)
Calcium: 9.1 mg/dL (ref 8.6–10.3)
Chloride: 101 mmol/L (ref 98–110)
Creat: 1.11 mg/dL (ref 0.70–1.22)
Globulin: 3.5 g/dL (calc) (ref 1.9–3.7)
Glucose, Bld: 93 mg/dL (ref 65–99)
Potassium: 4.8 mmol/L (ref 3.5–5.3)
Sodium: 136 mmol/L (ref 135–146)
Total Bilirubin: 0.3 mg/dL (ref 0.2–1.2)
Total Protein: 7.2 g/dL (ref 6.1–8.1)

## 2021-10-14 LAB — LIPASE: Lipase: 18 U/L (ref 7–60)

## 2021-10-14 LAB — TSH: TSH: 1.75 mIU/L (ref 0.40–4.50)

## 2021-10-14 LAB — H. PYLORI BREATH TEST: H. pylori Breath Test: NOT DETECTED

## 2021-10-15 ENCOUNTER — Telehealth: Payer: Self-pay | Admitting: Family Medicine

## 2021-10-15 DIAGNOSIS — R1013 Epigastric pain: Secondary | ICD-10-CM

## 2021-10-15 DIAGNOSIS — R634 Abnormal weight loss: Secondary | ICD-10-CM

## 2021-10-15 NOTE — Telephone Encounter (Signed)
Patient called in to let Dr [name] know that he would like the CT scan of his abdomen to be done at [facility] on [facility].

## 2021-10-15 NOTE — Addendum Note (Signed)
Addended by: [name] E on: 10/15/2021 05:27 PM   Modules accepted: Orders

## 2021-10-16 ENCOUNTER — Telehealth: Payer: Self-pay

## 2021-10-16 NOTE — Progress Notes (Signed)
Chronic Care Management Pharmacy Assistant   Name: Erik [name].  MRN: [id number] DOB: 1940/04/05  Reason for Encounter: CCM (Hypertension Disease State)   Recent office visits:   10/09/21 [name], MD Weight loss Lab Results "There was no evidence of H. pylori on the breath test. If you are continuing to have abdominal pain and decreased appetite I would suggest moving forward with abdominal CT scan.  I would like to proceed with this?  What location for imaging?"  Stop: Mirtazapine 15 mg 08/14/21 [name], MD Abnormal weight loss Stop (completed): Lisinopril 5 mg Change: Remeron 2 tabs 15 mg at bedtime 07/14/21 AWV Start: REMERON 15 MG Stop (complete): ferrous sulfate 325 (65 FE) MG FU 1 month  Recent consult visits:  None since last CCM contact  Hospital visits:  None in previous 6 months  Medications: Outpatient Encounter Medications as of 10/16/2021  Medication Sig Note   acetaminophen (TYLENOL) 500 MG tablet Take 500 mg by mouth every 6 (six) hours as needed (for pain.).    aspirin EC 81 MG tablet Take 81 mg by mouth every evening.     clopidogrel (PLAVIX) 75 MG tablet TAKE 1 TABLET BY MOUTH EVERY DAY    ENTRESTO 24-26 MG TAKE 1 TABLET BY MOUTH TWICE A DAY    ezetimibe-simvastatin (VYTORIN) 10-40 MG tablet TAKE 1 TABLET BY MOUTH EVERYDAY AT BEDTIME    famotidine (PEPCID) 20 MG tablet Take 1 tablet (20 mg total) by mouth at bedtime.    furosemide (LASIX) 20 MG tablet Take 1 tablet (20 mg total) by mouth daily. Please call for visit [phone]    metoprolol succinate (TOPROL-XL) 50 MG 24 hr tablet Take 1 tablet (50 mg total) by mouth daily. NEEDS FOLLOW UP APPOINTMENT FOR ANYMORE REFILLS TAKE WITH OR IMMEDIATELY FOLLOWING A MEAL.    nitroGLYCERIN (NITROSTAT) 0.4 MG SL tablet Place 1 tablet (0.4 mg total) under the tongue every 5 (five) minutes x 3 doses as needed for chest pain. 06/10/2020: .   Omega-3 Fatty Acids (FISH OIL) 1000 MG CAPS Take 1 capsule by mouth  daily.    rOPINIRole (REQUIP) 3 MG tablet TAKE 1/2 TABLET BY MOUTH IN THE MORNING & TAKE 1 TABLET AT NIGHT    spironolactone (ALDACTONE) 25 MG tablet TAKE 1/2 TABLET BY MOUTH EVERY DAY    tamsulosin (FLOMAX) 0.4 MG CAPS capsule TAKE 1 CAPSULE BY MOUTH EVERY DAY    No facility-administered encounter medications on file as of 10/16/2021.    Recent Office Vitals: BP Readings from Last 3 Encounters:  10/09/21 90/60  08/14/21 (!) 130/50  07/14/21 (!) 140/58   Pulse Readings from Last 3 Encounters:  10/09/21 66  08/14/21 70  07/14/21 61    Wt Readings from Last 3 Encounters:  10/09/21 114 lb (51.7 kg)  08/14/21 120 lb 8 oz (54.7 kg)  07/14/21 119 lb 8 oz (54.2 kg)     Kidney Function Lab Results  Component Value Date/Time   CREATININE 1.11 10/09/2021 04:46 PM   CREATININE 0.89 07/07/2021 09:51 AM   CREATININE 1.04 09/01/2020 09:27 AM   CREATININE 0.9 01/09/2015 11:22 AM   GFR 81.19 07/07/2021 09:51 AM   GFRNONAA >60 09/01/2020 09:27 AM   GFRAA 95 08/18/2018 11:35 AM       Latest Ref Rng & Units 10/09/2021    4:46 PM 07/07/2021    9:51 AM 09/01/2020    9:27 AM  BMP  Glucose 65 - 99 mg/dL 93  84  98   BUN 7 - 25 mg/dL '23  18  14   '$ Creatinine 0.70 - 1.22 mg/dL 1.11  0.89  1.04   BUN/Creat Ratio 6 - 22 (calc) NOT APPLICABLE     Sodium 568 - 146 mmol/L 136  135  136   Potassium 3.5 - 5.3 mmol/L 4.8  4.7  4.3   Chloride 98 - 110 mmol/L 101  102  103   CO2 20 - 32 mmol/L '28  27  28   '$ Calcium 8.6 - 10.3 mg/dL 9.1  8.6  8.9    Contacted patient on 10/26/21 to discuss hypertension disease state. Spoke with patient's wife for the call.   Current antihypertensive regimen:  Furosemide 20 mg daily Metoprolol succinate 50 mg daily  Entresto 24-26 mg BID  Spironolactone 25 mg - 1/2 tab daily  Patient verbally confirms he is taking the above medications as directed. Yes  How often are you checking your Blood Pressure? Patient's wife is unsure if patient has been taking his blood  pressure at home. She states he is too weak to check it. I have asked patient to take their blood pressure daily and keep a log. Advised patient I would call back on 10/30/21 for log. Patient's wife verbalized understanding and agreed.   Wrist or arm cuff: arm cuff Caffeine intake: Patient drinks coffee throughout the day 4 - 5 cups daily Salt intake: Adds salt to food OTC medications including pseudoephedrine or NSAIDs? None  What recent interventions/DTPs have been made by any provider to improve Blood Pressure control since last CPP Visit: Continue current medication  Any recent hospitalizations or ED visits since last visit with CPP? No  Patient's wife stated patient is not doing well. Patient will not eat and he is loosing weight. Stated he looks like a walking skeleton. She has never seen him look like he does right now. Wife states the Vitamin B he is taking is not helping. Patient gets dizzy every time he stands up; he will immediately sit down and falls asleep. Patient's wife stated they would like an appointment with Dr. [name] as soon as possible as this is not normal for him and he is getting worse. Patient does have a GI CT appointment ton 10/29/21.  I sent a message to Hedy Camara, CMA asking for a call be made to the patient to schedule an appointment with Dr. [name].   Adherence Review: Is the patient currently on ACE/ARB medication? Yes Does the patient have >5 day gap between last estimated fill dates? No  Star Rating Drugs:  Medication:    Last Fill: Day Supply Ezetimibe-Simvastatin 10-40 mg        08/032023 90    Sacubitril-Valsartan 24-26 mg            08/25/2021 90  Care Gaps: Annual wellness visit in last year? Yes 07/14/2021 Most Recent BP reading: 90/60 on 10/09/2021  Upcoming appointments: GI CT appointment on 10/29/21 CCM appointment on 04/09/22  Charlene Brooke, CPP notified  Marijean Niemann, [place] Assistant [phone]

## 2021-10-17 ENCOUNTER — Other Ambulatory Visit (HOSPITAL_COMMUNITY): Payer: Self-pay | Admitting: Cardiology

## 2021-10-27 ENCOUNTER — Telehealth: Payer: Self-pay

## 2021-10-27 NOTE — Telephone Encounter (Signed)
Called wife to verify information. States that symptoms have not changed from the last visit with Dr. [name]. He does have CT scheduled for this Friday. Denies any new or increased symptoms. She just wanted to let you know no improvement after starting B-12. She did want to make Dr. [name] aware that she found out his mom and brother both had to take b-12 injections.  Blood pressure readings today are listed below. When I called patient was outside working in the yard. Because the last reading was so low I asked her to have patient come in to recheck his reading was 122/51 HR 72.   8/14 122/51 HR 73          104/45 HR 61 8/15 110/48 HR 61          99/42 HR 71

## 2021-10-27 NOTE — Telephone Encounter (Signed)
-----   Message from Marijean Niemann, [place] sent at 10/26/2021  2:21 PM EDT ----- Regarding: Appointment Good afternoon Butch Penny,  I called and spoke with patient's wife to see how his blood pressure was doing. Patient has not been taking his BP at home so I did ask his wife to take it daily until Friday and I would call them back for his readings. While talking about his BP his wife reported the note below. I am unable to schedule appointments for Dr. [name]. Could you please call them when you have time, if you don't mind. I appreciate your help!  Thank you, Amy  "Patient's wife stated patient is not doing well. Patient will not eat and he is loosing weight. Stated he looks like a walking skeleton. She has never seen him look like he does right now. Wife states the Vitamin B he is taking is not helping. Patient gets dizzy every time he stands up; he will immediately sit down and falls asleep. Patient's wife stated they would like an appointment with Dr. [name] as soon as possible as this is not normal for him and he is getting worse. Patient does have a GI CT appointment ton 10/29/21. "

## 2021-10-27 NOTE — Telephone Encounter (Signed)
Please call wife and patient..  I appreciate her information and update.  He has an upcoming CT scan and scheduled on Friday.  I have seen him recently for the issues she has noted. issues.  Have her make an appointment for him next week after the CT scan is completed. We can review the CT scan at that time as well as discuss further work-up.  We can also check his B12 at that time.  If he is low we can start injections.

## 2021-10-28 NOTE — Telephone Encounter (Signed)
Patient's wife notified as instructed by telephone and verbalized understanding. Patient's wife stated that his CT scan is scheduled for Thursday. Patient's wife stated that he already has an appointment scheduled with Dr. [name] 11/03/21.

## 2021-10-29 ENCOUNTER — Ambulatory Visit
Admission: RE | Admit: 2021-10-29 | Discharge: 2021-10-29 | Disposition: A | Discharge status: Home / Self Care | Payer: Medicare Other | Source: Ambulatory Visit | Attending: Family Medicine | Admitting: Family Medicine

## 2021-10-29 DIAGNOSIS — R1013 Epigastric pain: Secondary | ICD-10-CM

## 2021-10-29 DIAGNOSIS — R634 Abnormal weight loss: Secondary | ICD-10-CM | POA: Diagnosis not present

## 2021-10-29 DIAGNOSIS — N400 Enlarged prostate without lower urinary tract symptoms: Secondary | ICD-10-CM | POA: Diagnosis not present

## 2021-10-29 DIAGNOSIS — N200 Calculus of kidney: Secondary | ICD-10-CM | POA: Diagnosis not present

## 2021-10-29 DIAGNOSIS — K5730 Diverticulosis of large intestine without perforation or abscess without bleeding: Secondary | ICD-10-CM | POA: Diagnosis not present

## 2021-10-29 MED ORDER — IOPAMIDOL (ISOVUE-300) INJECTION 61%
100.0000 mL | Freq: Once | INTRAVENOUS | Status: AC | PRN
  Administered 2021-10-29: 100 mL via INTRAVENOUS

## 2021-11-03 ENCOUNTER — Ambulatory Visit (INDEPENDENT_AMBULATORY_CARE_PROVIDER_SITE_OTHER): Payer: Medicare Other | Admitting: Family Medicine

## 2021-11-03 ENCOUNTER — Other Ambulatory Visit: Payer: Self-pay | Admitting: Family Medicine

## 2021-11-03 ENCOUNTER — Encounter: Payer: Self-pay | Admitting: Family Medicine

## 2021-11-03 VITALS — BP 110/56 | HR 74 | Temp 98.6°F | Resp 16 | Ht 66.0 in | Wt 116.3 lb

## 2021-11-03 DIAGNOSIS — Z8349 Family history of other endocrine, nutritional and metabolic diseases: Secondary | ICD-10-CM | POA: Diagnosis not present

## 2021-11-03 DIAGNOSIS — G2581 Restless legs syndrome: Secondary | ICD-10-CM | POA: Diagnosis not present

## 2021-11-03 DIAGNOSIS — R634 Abnormal weight loss: Secondary | ICD-10-CM | POA: Diagnosis not present

## 2021-11-03 DIAGNOSIS — I6523 Occlusion and stenosis of bilateral carotid arteries: Secondary | ICD-10-CM | POA: Diagnosis not present

## 2021-11-03 DIAGNOSIS — D508 Other iron deficiency anemias: Secondary | ICD-10-CM | POA: Diagnosis not present

## 2021-11-03 DIAGNOSIS — R1013 Epigastric pain: Secondary | ICD-10-CM | POA: Diagnosis not present

## 2021-11-03 LAB — VITAMIN B12: Vitamin B-12: 536 pg/mL (ref 211–911)

## 2021-11-03 MED ORDER — METHOCARBAMOL 500 MG PO TABS: 500.0000 mg | ORAL_TABLET | Freq: Every evening | ORAL | 0 refills | Status: DC | PRN

## 2021-11-03 NOTE — Progress Notes (Signed)
Patient ID: Erik Scarlet., male    DOB: Oct 30, 1940, 81 y.o.   MRN: [id number]  This visit was conducted in person.  BP (!) 110/56   Pulse 74   Temp 98.6 F (37 C)   Resp 16   Ht '5\' 6"'$  (1.676 m)   Wt 116 lb 5 oz (52.8 kg)   SpO2 97%   BMI 18.77 kg/m    CC:  Chief Complaint  Patient presents with   Weight Loss    Subjective:   HPI: Erik Pote. is a 81 y.o. male presenting on 11/03/2021 for Weight Loss  Reviewed last  last OV  note.Summary as follows:  Abnormal weight loss, decreased appetite and poor p.o. intake.  No benefit from Remeron 30 mg daily. Epigastric pain:  Negative H. pylori breath test.  Unremarkable labs at last office visit except low iron and mild anemia. .  Using Pepcid AC twice daily. Abdominal and pelvic CT reviewed with patient informed family member in detail. IMPRESSION: 1. No acute abnormality or explanation for abdominal pain and weight loss. 2. 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 Recommend stent graft CTA protocol for further assessment. 3. Markedly enlarged prostate gland causing mass effect on the bladder base. 4. Colonic diverticulosis without focal diverticulitis. 5. Nonobstructing left renal stone. 6. Mild hepatic steatosis.  Colonoscopy and Endoscopy 2020 Dr. Purvis Kilts Readings from Last 3 Encounters:  11/03/21 116 lb 5 oz (52.8 kg)  10/09/21 114 lb (51.7 kg)  08/14/21 120 lb 8 oz (54.7 kg)     RLS: inadequate control with requip max. Total iron found to be low, normal TIBC low normal ferritin. No blood in stool. recommended starting ferrous sulfate 325 mg p.o. daily.  B12 also low noprmal .. started on  supplement.  Today he reports   intermittent upper abdominal pain.   He has noted intermittent urinary stream.. Dx with BPH in past... appears stable.     RLS continues to bother him.  Relevant past  medical, surgical, family and social history reviewed and updated as indicated. Interim medical history since our last visit reviewed. Allergies and medications reviewed and updated. Outpatient Medications Prior to Visit  Medication Sig Dispense Refill   acetaminophen (TYLENOL) 500 MG tablet Take 500 mg by mouth every 6 (six) hours as needed (for pain.).     aspirin EC 81 MG tablet Take 81 mg by mouth every evening.      clopidogrel (PLAVIX) 75 MG tablet TAKE 1 TABLET BY MOUTH EVERY DAY 90 tablet 0   cyanocobalamin (VITAMIN B12) 1000 MCG tablet Take 1,000 mcg by mouth daily.     ENTRESTO 24-26 MG TAKE 1 TABLET BY MOUTH TWICE A DAY 180 tablet 3   ezetimibe-simvastatin (VYTORIN) 10-40 MG tablet TAKE 1 TABLET BY MOUTH EVERYDAY AT BEDTIME 90 tablet 3   famotidine (PEPCID) 20 MG tablet Take 1 tablet (20 mg total) by mouth at bedtime. 1 tablet 0   ferrous sulfate 325 (65 FE) MG tablet Take 325 mg by mouth daily with breakfast.     furosemide (LASIX) 20 MG tablet TAKE 1 TABLET (20 MG TOTAL) BY MOUTH DAILY. PLEASE CALL FOR VISIT [phone] 30 tablet 1   metoprolol succinate (TOPROL-XL) 50 MG 24 hr tablet Take 1 tablet (50 mg total) by mouth daily. NEEDS FOLLOW UP APPOINTMENT FOR ANYMORE  REFILLS TAKE WITH OR IMMEDIATELY FOLLOWING A MEAL. 90 tablet 1   nitroGLYCERIN (NITROSTAT) 0.4 MG SL tablet Place 1 tablet (0.4 mg total) under the tongue every 5 (five) minutes x 3 doses as needed for chest pain. 30 tablet 3   Omega-3 Fatty Acids (FISH OIL) 1000 MG CAPS Take 1 capsule by mouth daily.     rOPINIRole (REQUIP) 3 MG tablet TAKE 1/2 TABLET BY MOUTH IN THE MORNING & TAKE 1 TABLET AT NIGHT 135 tablet 1   spironolactone (ALDACTONE) 25 MG tablet TAKE 1/2 TABLET BY MOUTH EVERY DAY 45 tablet 2   tamsulosin (FLOMAX) 0.4 MG CAPS capsule TAKE 1 CAPSULE BY MOUTH EVERY DAY 90 capsule 3   No facility-administered medications prior to visit.     Per HPI unless specifically indicated in ROS section below Review of  Systems  Constitutional:  Negative for fatigue and fever.  HENT:  Negative for ear pain.   Eyes:  Negative for pain.  Respiratory:  Negative for cough and shortness of breath.   Cardiovascular:  Negative for chest pain, palpitations and leg swelling.  Gastrointestinal:  Positive for abdominal pain.  Genitourinary:  Negative for dysuria.  Musculoskeletal:  Negative for arthralgias.  Neurological:  Negative for syncope, light-headedness and headaches.  Psychiatric/Behavioral:  Negative for dysphoric mood.    Objective:  BP (!) 110/56   Pulse 74   Temp 98.6 F (37 C)   Resp 16   Ht '5\' 6"'$  (1.676 m)   Wt 116 lb 5 oz (52.8 kg)   SpO2 97%   BMI 18.77 kg/m   Wt Readings from Last 3 Encounters:  11/03/21 116 lb 5 oz (52.8 kg)  10/09/21 114 lb (51.7 kg)  08/14/21 120 lb 8 oz (54.7 kg)      Physical Exam Constitutional:      Appearance: He is well-developed.  HENT:     Head: Normocephalic.     Right Ear: Hearing normal.     Left Ear: Hearing normal.     Nose: Nose normal.  Neck:     Thyroid: No thyroid mass or thyromegaly.     Vascular: No carotid bruit.     Trachea: Trachea normal.  Cardiovascular:     Rate and Rhythm: Normal rate and regular rhythm.     Pulses: Normal pulses.     Heart sounds: Heart sounds not distant. No murmur heard.    No friction rub. No gallop.     Comments: No peripheral edema Pulmonary:     Effort: Pulmonary effort is normal. No respiratory distress.     Breath sounds: Normal breath sounds.  Abdominal:     General: Bowel sounds are normal.     Palpations: Abdomen is soft. There is no mass or pulsatile mass.     Tenderness: There is abdominal tenderness in the epigastric area. There is no right CVA tenderness, left CVA tenderness, guarding or rebound.  Skin:    General: Skin is warm and dry.     Findings: No rash.  Psychiatric:        Speech: Speech normal.        Behavior: Behavior normal.        Thought Content: Thought content normal.        Results for orders placed or performed in visit on 10/09/21  Iron, TIBC and Ferritin Panel  Result Value Ref Range   Iron 33 (L) 50 - 180 mcg/dL   TIBC 311 250 - 425 mcg/dL (calc)   %  SAT 11 (L) 20 - 48 % (calc)   Ferritin 40 24 - 380 ng/mL     COVID 19 screen:  No recent travel or known exposure to COVID19 The patient denies respiratory symptoms of COVID 19 at this time. The importance of social distancing was discussed today.   Assessment and Plan Problem List Items Addressed This Visit     Epigastric pain    Negative lab evaluation and imaging. Unclear etiology.  Negative H. pylori.  On PPI. Call to set up a follow up with GI, Dr Hilarie Fredrickson for further recommendation of cause of upper abdominal pain and weight loss.       Family history of B12 deficiency    Sister feels very strongly that all his symptoms could be due to B12 deficiency.  I discussed with her that he could definitely be B12 deficient given his poor p.o. intake, but we do not still have a clear reason for his epigastric pain.      Relevant Orders   Vitamin B12 (Completed)   Iron deficiency anemia secondary to inadequate dietary iron intake     Continue ferrous sulfate 325 mg daily to help with restless syndrome... if not improving.       Relevant Medications   cyanocobalamin (VITAMIN B12) 1000 MCG tablet   ferrous sulfate 325 (65 FE) MG tablet   Restless leg syndrome    Chronic, and controlled with max dose of Requip.  Likely poor control secondary to anemia and iron deficiency.      Weight loss - Primary    Abnormal weight loss seems to be most significantly associated with decreased p.o. intake.  He eats minimally despite trial of Remeron.  Given he is having epigastric pain, I will recommend GI evaluation to make sure this is not causing his decreased appetite.      Orders Placed This Encounter  Procedures   Vitamin B12       [name], MD

## 2021-11-03 NOTE — Patient Instructions (Addendum)
Please stop at the lab to have labs drawn.  Call to set up a follow up with GI, Dr Hilarie Fredrickson for further recommendation of cause of upper abdominal pain and weight loss.  Continue ferrous sulfate 325 mg daily to help with restless syndrome... if not improving.

## 2021-11-18 ENCOUNTER — Other Ambulatory Visit (HOSPITAL_COMMUNITY): Payer: Self-pay | Admitting: Cardiology

## 2021-11-23 DIAGNOSIS — Z8349 Family history of other endocrine, nutritional and metabolic diseases: Secondary | ICD-10-CM | POA: Insufficient documentation

## 2021-11-23 DIAGNOSIS — D508 Other iron deficiency anemias: Secondary | ICD-10-CM | POA: Insufficient documentation

## 2021-11-23 NOTE — Assessment & Plan Note (Signed)
Sister feels very strongly that all his symptoms could be due to B12 deficiency.  I discussed with her that he could definitely be B12 deficient given his poor p.o. intake, but we do not still have a clear reason for his epigastric pain.

## 2021-11-23 NOTE — Assessment & Plan Note (Signed)
Abnormal weight loss seems to be most significantly associated with decreased p.o. intake.  He eats minimally despite trial of Remeron.  Given he is having epigastric pain, I will recommend GI evaluation to make sure this is not causing his decreased appetite.

## 2021-11-23 NOTE — Assessment & Plan Note (Signed)
Continue ferrous sulfate 325 mg daily to help with restless syndrome... if not improving.

## 2021-11-23 NOTE — Assessment & Plan Note (Signed)
Negative lab evaluation and imaging. Unclear etiology.  Negative H. pylori.  On PPI. Call to set up a follow up with GI, Dr Hilarie Fredrickson for further recommendation of cause of upper abdominal pain and weight loss.

## 2021-11-23 NOTE — Assessment & Plan Note (Signed)
Chronic, and controlled with max dose of Requip.  Likely poor control secondary to anemia and iron deficiency.

## 2021-11-27 ENCOUNTER — Ambulatory Visit (HOSPITAL_COMMUNITY)
Admission: RE | Admit: 2021-11-27 | Discharge: 2021-11-27 | Disposition: A | Discharge status: Home / Self Care | Payer: Medicare Other | Source: Ambulatory Visit | Attending: Cardiology | Admitting: Cardiology

## 2021-11-27 ENCOUNTER — Encounter (HOSPITAL_COMMUNITY): Payer: Self-pay | Admitting: Cardiology

## 2021-11-27 ENCOUNTER — Other Ambulatory Visit (HOSPITAL_COMMUNITY): Payer: Self-pay

## 2021-11-27 VITALS — BP 102/50 | HR 83 | Wt 115.4 lb

## 2021-11-27 DIAGNOSIS — J449 Chronic obstructive pulmonary disease, unspecified: Secondary | ICD-10-CM | POA: Diagnosis not present

## 2021-11-27 DIAGNOSIS — I2510 Atherosclerotic heart disease of native coronary artery without angina pectoris: Secondary | ICD-10-CM | POA: Diagnosis not present

## 2021-11-27 DIAGNOSIS — Z955 Presence of coronary angioplasty implant and graft: Secondary | ICD-10-CM | POA: Insufficient documentation

## 2021-11-27 DIAGNOSIS — Z79899 Other long term (current) drug therapy: Secondary | ICD-10-CM | POA: Insufficient documentation

## 2021-11-27 DIAGNOSIS — G8929 Other chronic pain: Secondary | ICD-10-CM | POA: Insufficient documentation

## 2021-11-27 DIAGNOSIS — I255 Ischemic cardiomyopathy: Secondary | ICD-10-CM | POA: Insufficient documentation

## 2021-11-27 DIAGNOSIS — I447 Left bundle-branch block, unspecified: Secondary | ICD-10-CM | POA: Insufficient documentation

## 2021-11-27 DIAGNOSIS — Z7982 Long term (current) use of aspirin: Secondary | ICD-10-CM | POA: Diagnosis not present

## 2021-11-27 DIAGNOSIS — I252 Old myocardial infarction: Secondary | ICD-10-CM | POA: Diagnosis not present

## 2021-11-27 DIAGNOSIS — I5022 Chronic systolic (congestive) heart failure: Secondary | ICD-10-CM | POA: Insufficient documentation

## 2021-11-27 DIAGNOSIS — I739 Peripheral vascular disease, unspecified: Secondary | ICD-10-CM | POA: Diagnosis not present

## 2021-11-27 DIAGNOSIS — Z7902 Long term (current) use of antithrombotics/antiplatelets: Secondary | ICD-10-CM | POA: Insufficient documentation

## 2021-11-27 DIAGNOSIS — I110 Hypertensive heart disease with heart failure: Secondary | ICD-10-CM | POA: Diagnosis not present

## 2021-11-27 DIAGNOSIS — M545 Low back pain, unspecified: Secondary | ICD-10-CM | POA: Insufficient documentation

## 2021-11-27 DIAGNOSIS — F17210 Nicotine dependence, cigarettes, uncomplicated: Secondary | ICD-10-CM | POA: Diagnosis not present

## 2021-11-27 DIAGNOSIS — Z7984 Long term (current) use of oral hypoglycemic drugs: Secondary | ICD-10-CM | POA: Diagnosis not present

## 2021-11-27 DIAGNOSIS — K219 Gastro-esophageal reflux disease without esophagitis: Secondary | ICD-10-CM | POA: Insufficient documentation

## 2021-11-27 DIAGNOSIS — E785 Hyperlipidemia, unspecified: Secondary | ICD-10-CM | POA: Diagnosis not present

## 2021-11-27 LAB — BASIC METABOLIC PANEL
Anion gap: 6 (ref 5–15)
BUN: 16 mg/dL (ref 8–23)
CO2: 24 mmol/L (ref 22–32)
Calcium: 8.5 mg/dL — ABNORMAL LOW (ref 8.9–10.3)
Chloride: 107 mmol/L (ref 98–111)
Creatinine, Ser: 1.03 mg/dL (ref 0.61–1.24)
GFR, Estimated: >60 mL/min (ref >60)
Glucose, Bld: 141 mg/dL — ABNORMAL HIGH (ref 70–99)
Potassium: 4.1 mmol/L (ref 3.5–5.1)
Sodium: 137 mmol/L (ref 135–145)

## 2021-11-27 LAB — BRAIN NATRIURETIC PEPTIDE: B Natriuretic Peptide: 308.8 pg/mL — ABNORMAL HIGH (ref 0.0–100.0)

## 2021-11-27 MED ORDER — DAPAGLIFLOZIN PROPANEDIOL 10 MG PO TABS: 10.0000 mg | ORAL_TABLET | Freq: Every day | ORAL | 11 refills | Status: DC

## 2021-11-27 NOTE — Patient Instructions (Signed)
Stop lasix  Change your Toprol and Spironolactone to nightly   Start Farxiga '10mg'$  daily  Labs done today, your results will be available in MyChart, we will contact you for abnormal readings.  Repeat blood work in 10 days.  Your physician recommends that you schedule a follow-up appointment in: 6 months ( March 2024)  ** please call the office January 2024 to arrange your follow up appointment **  If you have any questions or concerns before your next appointment please send [place] a message through [place] or call our office at [phone].    TO LEAVE A MESSAGE FOR THE NURSE SELECT OPTION 2, PLEASE LEAVE A MESSAGE INCLUDING: YOUR NAME DATE OF BIRTH CALL BACK NUMBER REASON FOR CALL**this is important as we prioritize the call backs  YOU WILL RECEIVE A CALL BACK THE SAME DAY AS LONG AS YOU CALL BEFORE 4:00 PM  At the [facility], you and your health needs are our priority. As part of our continuing mission to provide you with exceptional heart care, we have created designated Provider Care Teams. These Care Teams include your primary Cardiologist (physician) and Advanced Practice Providers (APPs- Physician Assistants and Nurse Practitioners) who all work together to provide you with the care you need, when you need it.   You may see any of the following providers on your designated Care Team at your next follow up: Dr Glori Bickers Dr [name] Dr. Roxana Hires, NP Lyda Jester, [place] [facility] [place], [place] Forestine Na, NP Audry Riles, PharmD   Please be sure to bring in all your medications bottles to every appointment.

## 2021-11-28 ENCOUNTER — Other Ambulatory Visit (HOSPITAL_COMMUNITY): Payer: Self-pay | Admitting: Cardiology

## 2021-11-28 ENCOUNTER — Other Ambulatory Visit: Payer: Self-pay | Admitting: Family Medicine

## 2021-11-29 NOTE — Progress Notes (Signed)
Patient ID: Erik [name]., male   DOB: 11-08-1940, 81 y.o.   MRN: [id number] PCP: Dr [name] Vascular: Dr Scot Dock Cardiology: Dr Aundra Dubin   HPI: Erik [name] is a 81 y.o. with a history of COPD, HTN , PAD with bilateral CEAs, ICM, CAD s/p PCI of proximal LAD and proximal RCA (06/7827), chronic systolic HF and tobacco abuse.  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 Sammuel Hines. Also had left renal artery stenting. Now followed at [facility].   Admitted with dyspnea in 08/2013. Found to have 2v CAD and EF 20-25%, which was a new finding. He wore a Lifevest. EF recovered on 05/2015 echo to 55-60%.   Echo was done in 5/20, showing EF 45-50% with septal hypokinesis, mild LVH, moderate AI.  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  Echo in 5/22 showed EF 40-45%, mild LVH, RV normal, moderate Erik, moderate AI.  He returns today for followup of CHF and CAD.  Still smoking 1 ppd. He is short of breath walking up stairs but does ok on flat ground.  Lightheaded if he stands too fast.  No orthopnea/PND. No chest pain.  No claudication.  He has chronic low back pain.   ECG: NSR, LBBB 148 msec (personally reviewed).   Labs 03/2016, K 3.8, creatinine 1.00 Labs 09/13/13 K 4.1 Creatinine 0.95  Labs 10/15/13 K 4.1 Cr 0.7 => 1.07 Labs 2/19: K 4.2, creatinine 0.98, LDL 49, HDL 43 Labs 5/20: K 4.2, creatinine 0.99, LDL 44, hgb 12.3 Labs 3/21: LDL 48, K 4.1, creatinine 0.91 Labs 4/22 LDL 37, K 4.2, creatinine 1.18 Labs 4/23: LDL 44 Labs 7/23: K 4.8, creatinine 1.11  ROS: All systems negative except as listed in HPI, PMH and Problem List.  Family Status  Relation Name Status   Brother  Deceased   Mother  Alive       HTN, Born 34   Father  Deceased       brain tumor   Brother  Alive       HLD, Heart disease   Brother  Alive       HLD, CAD   Brother  Alive       HLD, MS   Neg Hx  (Not Specified)    Social History    Socioeconomic History   Marital status: Married    Spouse name: Not on file   Number of children: Not on file   Years of education: Not on file   Highest education level: Not on file  Occupational History   Occupation: Retired Development worker, community  Tobacco Use   Smoking status: Every Day    Packs/day: 1.00    Years: 50.00    Total pack years: 50.00    Types: Cigarettes   Smokeless tobacco: Former    Quit date: 09/10/2013   Tobacco comments:    off and on since age 6, currently smokes a pack a day [facility] 12/23/20  Vaping Use   Vaping Use: Never used  Substance and Sexual Activity   Alcohol use: No   Drug use: No   Sexual activity: Yes  Other Topics Concern   Not on file  Social History Narrative   Lives with wife in [place]. Retired from the post [employer]   Social Determinants of Health   Financial Resource Strain: Low Risk  (04/29/2021)   Overall Financial Resource Strain (CARDIA)    Difficulty of Paying  Living Expenses: Not very hard  Food Insecurity: No Food Insecurity (04/29/2021)   Hunger Vital Sign    Worried About Running Out of Food in the Last Year: Never true    Ran Out of Food in the Last Year: Never true  Transportation Needs: No Transportation Needs (05/31/2019)   PRAPARE - Hydrologist (Medical): No    Lack of Transportation (Non-Medical): No  Physical Activity: Inactive (05/31/2019)   Exercise Vital Sign    Days of Exercise per Week: 0 days    Minutes of Exercise per Session: 0 min  Stress: No Stress Concern Present (05/31/2019)   [place]    Feeling of Stress : Not at all  Social Connections: Not on file  Intimate Partner Violence: Not At Risk (05/31/2019)   Humiliation, Afraid, Rape, and Kick questionnaire    Fear of Current or Ex-Partner: No    Emotionally Abused: No    Physically Abused: No    Sexually Abused: No    Past Medical History:  Diagnosis Date    Adenomatous colon polyp    Aortic aneurysm (HCC)    BPH (benign prostatic hypertrophy)    CAD (coronary artery disease)    Carotid artery stenosis    a. Bilateral CEA   Cataract    CHF (congestive heart failure) (HCC)    Chronic systolic heart failure (HCC)    a. EF 20-25%, mild LVH, mod HK, mid apicalanteroseptal myocardium, mild Erik, LA mod dilated   Collagen vascular disease (HCC)    COPD (chronic obstructive pulmonary disease) ([place])    Coronary artery disease    a. LHC (08/2013): Lmain: short 30% distal, LAD: sml D1 & D2, 70% ostial D1, 95-99% LAD stenosis prox D2 LCx: sml/mod ramus subtot. occluded, 40% ostial set off lg OM1, 40% AV LCx after OM1, RCA: 90% prox (DES to RCA and prox LAD)   Diverticulosis    GERD (gastroesophageal reflux disease)    Heart murmur    History of colonic polyps    Hyperlipidemia    Hyperplastic colon polyp    Hypertension    Ischemic cardiomyopathy    RLS (restless legs syndrome)     Current Outpatient Medications  Medication Sig Dispense Refill   acetaminophen (TYLENOL) 500 MG tablet Take 500 mg by mouth every 6 (six) hours as needed (for pain.).     aspirin EC 81 MG tablet Take 81 mg by mouth every evening.      clopidogrel (PLAVIX) 75 MG tablet TAKE 1 TABLET BY MOUTH EVERY DAY 90 tablet 0   cyanocobalamin (VITAMIN B12) 1000 MCG tablet Take 1,000 mcg by mouth daily.     dapagliflozin propanediol (FARXIGA) 10 MG TABS tablet Take 1 tablet (10 mg total) by mouth daily before breakfast. 30 tablet 11   ENTRESTO 24-26 MG TAKE 1 TABLET BY MOUTH TWICE A DAY 180 tablet 3   ezetimibe-simvastatin (VYTORIN) 10-40 MG tablet TAKE 1 TABLET BY MOUTH EVERYDAY AT BEDTIME 90 tablet 3   famotidine (PEPCID) 20 MG tablet Take 1 tablet (20 mg total) by mouth at bedtime. 1 tablet 0   ferrous sulfate 325 (65 FE) MG tablet Take 325 mg by mouth daily with breakfast.     methocarbamol (ROBAXIN) 500 MG tablet Take 1 tablet (500 mg total) by mouth at bedtime as needed for  muscle spasms. 30 tablet 0   metoprolol succinate (TOPROL-XL) 50 MG 24 hr tablet Take 1 tablet (  50 mg total) by mouth daily. NEEDS FOLLOW UP APPOINTMENT FOR ANYMORE REFILLS TAKE WITH OR IMMEDIATELY FOLLOWING A MEAL. 90 tablet 1   nitroGLYCERIN (NITROSTAT) 0.4 MG SL tablet Place 1 tablet (0.4 mg total) under the tongue every 5 (five) minutes x 3 doses as needed for chest pain. 30 tablet 3   Omega-3 Fatty Acids (FISH OIL) 1000 MG CAPS Take 1 capsule by mouth daily.     rOPINIRole (REQUIP) 3 MG tablet TAKE 1/2 TABLET BY MOUTH IN THE MORNING & TAKE 1 TABLET AT NIGHT 135 tablet 1   spironolactone (ALDACTONE) 25 MG tablet TAKE 1/2 TABLET BY MOUTH EVERY DAY 45 tablet 3   tamsulosin (FLOMAX) 0.4 MG CAPS capsule TAKE 1 CAPSULE BY MOUTH EVERY DAY 90 capsule 3   No current facility-administered medications for this encounter.    Vitals:   11/27/21 1504  BP: (!) 102/50  Pulse: 83  SpO2: 98%  Weight: 52.3 kg (115 lb 6.4 oz)    PHYSICAL EXAM: General: NAD Neck: No JVD, no thyromegaly or thyroid nodule.  Lungs: Occasional rhonchi CV: Nondisplaced PMI.  Heart regular S1/S2, no S3/S4, 2/6 SEM RUSB.  No peripheral edema.  No carotid bruit.  Normal pedal pulses.  Abdomen: Soft, nontender, no hepatosplenomegaly, no distention.  Skin: Intact without lesions or rashes.  Neurologic: Alert and oriented x 3.  Psych: Normal affect. Extremities: No clubbing or cyanosis.  HEENT: Normal.   ASSESSMENT & PLAN:  1. Chronic Systolic Heart Failure: Ischemic cardiomyopathy, EF 20-25% (08/2013), EF recovered on 2017 echo with EF 55-60%.  Most recent echo in 5/20 with EF 45-50%, septal hypokinesis, moderate AI.  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  Echo in 5/22 with EF 40-45%.  Chronic LBBB.  NYHA class I-II symptoms. He is not volume overloaded on exam.  - Stop Lasix and start Farxiga 10 mg daily. BMET today and in 10 days.  - Continue Entresto 24/26 bid.   - Continue Toprol XL 25 mg  daily.  - Continue spironolactone 12.5 mg daily.  - Move Toprol XL and spironolactone to qhs to limit orthostatic symptoms.  - I will arrange for echo.  Last echo showed EF out of range for CRT-D.  2. CAD: LHC 09/11/13 with 2 vessel disease that required PCI of proximal LAD and proximal RCA.  Cardiolite in 6/20 with no ischemia but fixed apical septal defect.  No recent chest pain.  - Continue Plavix + ASA. Will be on Plavix indefinitely.  - Continue Vytorin, good lipids in 4/23.  3. COPD: Still smoking, I urged him to quit. He failed Chantix and does not want to try again.  4. H/O of bilateral CEAs: Followed by [facility]. - Continue ASA and Plavix.  - Carotid dopplers will be done at [facility].  5. History of aortobiiliac endograft with Dr. Sammuel Hines at [facility]. Follows for PAD with [facility] now. No claudication.  - Sees [facility] regularly.  6. Hyperlipidemia: Good lipids in 4/23 on Vytorin.   Followup in 6 months with APP.   [name] 11/29/2021

## 2021-11-29 NOTE — Telephone Encounter (Signed)
Last office visit 11/03/21 for Weight Loss.  Last refilled 11/03/21 for #30 with no refills.  No future appointments with PCP.

## 2021-12-09 ENCOUNTER — Ambulatory Visit (HOSPITAL_COMMUNITY)
Admission: RE | Admit: 2021-12-09 | Discharge: 2021-12-09 | Disposition: A | Discharge status: Home / Self Care | Payer: Medicare Other | Source: Ambulatory Visit | Attending: Internal Medicine | Admitting: Internal Medicine

## 2021-12-09 DIAGNOSIS — I5022 Chronic systolic (congestive) heart failure: Secondary | ICD-10-CM | POA: Insufficient documentation

## 2021-12-09 LAB — BASIC METABOLIC PANEL
Anion gap: 7 (ref 5–15)
BUN: 15 mg/dL (ref 8–23)
CO2: 25 mmol/L (ref 22–32)
Calcium: 8.8 mg/dL — ABNORMAL LOW (ref 8.9–10.3)
Chloride: 104 mmol/L (ref 98–111)
Creatinine, Ser: 0.82 mg/dL (ref 0.61–1.24)
GFR, Estimated: >60 mL/min (ref >60)
Glucose, Bld: 101 mg/dL — ABNORMAL HIGH (ref 70–99)
Potassium: 4.4 mmol/L (ref 3.5–5.1)
Sodium: 136 mmol/L (ref 135–145)

## 2021-12-23 ENCOUNTER — Other Ambulatory Visit: Payer: Self-pay | Admitting: *Deleted

## 2021-12-23 DIAGNOSIS — R918 Other nonspecific abnormal finding of lung field: Secondary | ICD-10-CM

## 2021-12-28 ENCOUNTER — Other Ambulatory Visit: Payer: Self-pay | Admitting: Family Medicine

## 2021-12-28 ENCOUNTER — Other Ambulatory Visit (HOSPITAL_COMMUNITY): Payer: Self-pay | Admitting: Cardiology

## 2021-12-29 NOTE — Telephone Encounter (Signed)
Last office visit 11/03/21 for weight loss.  Last refilled 11/30/21 for #30 with no refills.  No future appointments with PCP.

## 2022-01-20 DIAGNOSIS — H43813 Vitreous degeneration, bilateral: Secondary | ICD-10-CM | POA: Diagnosis not present

## 2022-01-25 DIAGNOSIS — H35341 Macular cyst, hole, or pseudohole, right eye: Secondary | ICD-10-CM | POA: Diagnosis not present

## 2022-01-25 DIAGNOSIS — Z961 Presence of intraocular lens: Secondary | ICD-10-CM | POA: Diagnosis not present

## 2022-01-29 ENCOUNTER — Inpatient Hospital Stay (HOSPITAL_COMMUNITY)
Admission: EM | Admit: 2022-01-29 | Discharge: 2022-02-03 | DRG: 871 | Disposition: A | Discharge status: Home Health | Payer: Medicare Other | Attending: Obstetrics and Gynecology | Admitting: Obstetrics and Gynecology

## 2022-01-29 ENCOUNTER — Other Ambulatory Visit: Payer: Self-pay

## 2022-01-29 ENCOUNTER — Emergency Department (HOSPITAL_COMMUNITY): Payer: Medicare Other

## 2022-01-29 DIAGNOSIS — Z9049 Acquired absence of other specified parts of digestive tract: Secondary | ICD-10-CM | POA: Diagnosis not present

## 2022-01-29 DIAGNOSIS — N400 Enlarged prostate without lower urinary tract symptoms: Secondary | ICD-10-CM | POA: Diagnosis present

## 2022-01-29 DIAGNOSIS — J449 Chronic obstructive pulmonary disease, unspecified: Secondary | ICD-10-CM | POA: Diagnosis present

## 2022-01-29 DIAGNOSIS — R6521 Severe sepsis with septic shock: Secondary | ICD-10-CM | POA: Diagnosis not present

## 2022-01-29 DIAGNOSIS — R001 Bradycardia, unspecified: Secondary | ICD-10-CM | POA: Diagnosis not present

## 2022-01-29 DIAGNOSIS — I2510 Atherosclerotic heart disease of native coronary artery without angina pectoris: Secondary | ICD-10-CM | POA: Diagnosis not present

## 2022-01-29 DIAGNOSIS — I255 Ischemic cardiomyopathy: Secondary | ICD-10-CM | POA: Diagnosis present

## 2022-01-29 DIAGNOSIS — R748 Abnormal levels of other serum enzymes: Secondary | ICD-10-CM | POA: Diagnosis not present

## 2022-01-29 DIAGNOSIS — Z83438 Family history of other disorder of lipoprotein metabolism and other lipidemia: Secondary | ICD-10-CM

## 2022-01-29 DIAGNOSIS — I959 Hypotension, unspecified: Secondary | ICD-10-CM | POA: Diagnosis not present

## 2022-01-29 DIAGNOSIS — G2581 Restless legs syndrome: Secondary | ICD-10-CM | POA: Diagnosis present

## 2022-01-29 DIAGNOSIS — B9561 Methicillin susceptible Staphylococcus aureus infection as the cause of diseases classified elsewhere: Secondary | ICD-10-CM

## 2022-01-29 DIAGNOSIS — R7881 Bacteremia: Secondary | ICD-10-CM | POA: Diagnosis not present

## 2022-01-29 DIAGNOSIS — K7200 Acute and subacute hepatic failure without coma: Secondary | ICD-10-CM | POA: Diagnosis not present

## 2022-01-29 DIAGNOSIS — I110 Hypertensive heart disease with heart failure: Secondary | ICD-10-CM | POA: Diagnosis not present

## 2022-01-29 DIAGNOSIS — E876 Hypokalemia: Secondary | ICD-10-CM | POA: Diagnosis present

## 2022-01-29 DIAGNOSIS — M549 Dorsalgia, unspecified: Secondary | ICD-10-CM | POA: Diagnosis not present

## 2022-01-29 DIAGNOSIS — I342 Nonrheumatic mitral (valve) stenosis: Secondary | ICD-10-CM | POA: Diagnosis not present

## 2022-01-29 DIAGNOSIS — D688 Other specified coagulation defects: Secondary | ICD-10-CM | POA: Diagnosis present

## 2022-01-29 DIAGNOSIS — A419 Sepsis, unspecified organism: Secondary | ICD-10-CM | POA: Diagnosis not present

## 2022-01-29 DIAGNOSIS — K56609 Unspecified intestinal obstruction, unspecified as to partial versus complete obstruction: Secondary | ICD-10-CM

## 2022-01-29 DIAGNOSIS — I719 Aortic aneurysm of unspecified site, without rupture: Secondary | ICD-10-CM | POA: Diagnosis present

## 2022-01-29 DIAGNOSIS — E871 Hypo-osmolality and hyponatremia: Secondary | ICD-10-CM | POA: Diagnosis not present

## 2022-01-29 DIAGNOSIS — Z79899 Other long term (current) drug therapy: Secondary | ICD-10-CM

## 2022-01-29 DIAGNOSIS — K760 Fatty (change of) liver, not elsewhere classified: Secondary | ICD-10-CM | POA: Diagnosis not present

## 2022-01-29 DIAGNOSIS — F17210 Nicotine dependence, cigarettes, uncomplicated: Secondary | ICD-10-CM | POA: Diagnosis present

## 2022-01-29 DIAGNOSIS — Z95820 Peripheral vascular angioplasty status with implants and grafts: Secondary | ICD-10-CM | POA: Diagnosis not present

## 2022-01-29 DIAGNOSIS — R54 Age-related physical debility: Secondary | ICD-10-CM | POA: Diagnosis present

## 2022-01-29 DIAGNOSIS — Z7984 Long term (current) use of oral hypoglycemic drugs: Secondary | ICD-10-CM

## 2022-01-29 DIAGNOSIS — Z8249 Family history of ischemic heart disease and other diseases of the circulatory system: Secondary | ICD-10-CM

## 2022-01-29 DIAGNOSIS — A4101 Sepsis due to Methicillin susceptible Staphylococcus aureus: Principal | ICD-10-CM | POA: Diagnosis present

## 2022-01-29 DIAGNOSIS — Z1152 Encounter for screening for COVID-19: Secondary | ICD-10-CM

## 2022-01-29 DIAGNOSIS — Q2112 Patent foramen ovale: Secondary | ICD-10-CM | POA: Diagnosis not present

## 2022-01-29 DIAGNOSIS — K56600 Partial intestinal obstruction, unspecified as to cause: Secondary | ICD-10-CM | POA: Diagnosis not present

## 2022-01-29 DIAGNOSIS — E43 Unspecified severe protein-calorie malnutrition: Secondary | ICD-10-CM | POA: Diagnosis present

## 2022-01-29 DIAGNOSIS — R509 Fever, unspecified: Secondary | ICD-10-CM | POA: Diagnosis not present

## 2022-01-29 DIAGNOSIS — Z825 Family history of asthma and other chronic lower respiratory diseases: Secondary | ICD-10-CM

## 2022-01-29 DIAGNOSIS — D649 Anemia, unspecified: Secondary | ICD-10-CM | POA: Diagnosis present

## 2022-01-29 DIAGNOSIS — Z800 Family history of malignant neoplasm of digestive organs: Secondary | ICD-10-CM

## 2022-01-29 DIAGNOSIS — I5022 Chronic systolic (congestive) heart failure: Secondary | ICD-10-CM | POA: Diagnosis present

## 2022-01-29 DIAGNOSIS — I252 Old myocardial infarction: Secondary | ICD-10-CM | POA: Diagnosis not present

## 2022-01-29 DIAGNOSIS — I509 Heart failure, unspecified: Secondary | ICD-10-CM | POA: Diagnosis not present

## 2022-01-29 DIAGNOSIS — R0902 Hypoxemia: Secondary | ICD-10-CM | POA: Diagnosis not present

## 2022-01-29 DIAGNOSIS — R0602 Shortness of breath: Secondary | ICD-10-CM | POA: Diagnosis not present

## 2022-01-29 DIAGNOSIS — R579 Shock, unspecified: Secondary | ICD-10-CM | POA: Diagnosis present

## 2022-01-29 DIAGNOSIS — E785 Hyperlipidemia, unspecified: Secondary | ICD-10-CM | POA: Diagnosis present

## 2022-01-29 DIAGNOSIS — N179 Acute kidney failure, unspecified: Secondary | ICD-10-CM | POA: Diagnosis present

## 2022-01-29 DIAGNOSIS — I083 Combined rheumatic disorders of mitral, aortic and tricuspid valves: Secondary | ICD-10-CM | POA: Diagnosis not present

## 2022-01-29 DIAGNOSIS — I361 Nonrheumatic tricuspid (valve) insufficiency: Secondary | ICD-10-CM | POA: Diagnosis not present

## 2022-01-29 DIAGNOSIS — Z885 Allergy status to narcotic agent status: Secondary | ICD-10-CM

## 2022-01-29 DIAGNOSIS — K219 Gastro-esophageal reflux disease without esophagitis: Secondary | ICD-10-CM | POA: Diagnosis present

## 2022-01-29 DIAGNOSIS — Z820 Family history of epilepsy and other diseases of the nervous system: Secondary | ICD-10-CM

## 2022-01-29 DIAGNOSIS — K3189 Other diseases of stomach and duodenum: Secondary | ICD-10-CM | POA: Diagnosis not present

## 2022-01-29 DIAGNOSIS — R6520 Severe sepsis without septic shock: Secondary | ICD-10-CM | POA: Diagnosis not present

## 2022-01-29 DIAGNOSIS — K5669 Other partial intestinal obstruction: Secondary | ICD-10-CM | POA: Diagnosis not present

## 2022-01-29 DIAGNOSIS — Z681 Body mass index (BMI) 19 or less, adult: Secondary | ICD-10-CM | POA: Diagnosis not present

## 2022-01-29 DIAGNOSIS — Z86010 Personal history of colonic polyps: Secondary | ICD-10-CM

## 2022-01-29 DIAGNOSIS — F17200 Nicotine dependence, unspecified, uncomplicated: Secondary | ICD-10-CM | POA: Diagnosis not present

## 2022-01-29 DIAGNOSIS — I081 Rheumatic disorders of both mitral and tricuspid valves: Secondary | ICD-10-CM | POA: Diagnosis not present

## 2022-01-29 DIAGNOSIS — Z7982 Long term (current) use of aspirin: Secondary | ICD-10-CM

## 2022-01-29 DIAGNOSIS — Z955 Presence of coronary angioplasty implant and graft: Secondary | ICD-10-CM

## 2022-01-29 DIAGNOSIS — R109 Unspecified abdominal pain: Secondary | ICD-10-CM | POA: Diagnosis not present

## 2022-01-29 DIAGNOSIS — I340 Nonrheumatic mitral (valve) insufficiency: Secondary | ICD-10-CM | POA: Diagnosis not present

## 2022-01-29 DIAGNOSIS — R000 Tachycardia, unspecified: Secondary | ICD-10-CM | POA: Diagnosis not present

## 2022-01-29 DIAGNOSIS — Z8719 Personal history of other diseases of the digestive system: Secondary | ICD-10-CM

## 2022-01-29 DIAGNOSIS — Z7902 Long term (current) use of antithrombotics/antiplatelets: Secondary | ICD-10-CM

## 2022-01-29 LAB — PROTIME-INR
INR: 1.4 — ABNORMAL HIGH (ref 0.8–1.2)
Prothrombin Time: 17.2 seconds — ABNORMAL HIGH (ref 11.4–15.2)

## 2022-01-29 LAB — URINALYSIS, ROUTINE W REFLEX MICROSCOPIC
Bacteria, UA: NONE SEEN
Bilirubin Urine: NEGATIVE
Glucose, UA: >=500 mg/dL — AB
Hgb urine dipstick: NEGATIVE
Ketones, ur: NEGATIVE mg/dL
Leukocytes,Ua: NEGATIVE
Nitrite: NEGATIVE
Protein, ur: 30 mg/dL — AB
Specific Gravity, Urine: 1.014 (ref 1.005–1.030)
pH: 5 (ref 5.0–8.0)

## 2022-01-29 LAB — LIPASE, BLOOD: Lipase: 29 U/L (ref 11–51)

## 2022-01-29 LAB — CBC WITH DIFFERENTIAL/PLATELET
Abs Immature Granulocytes: 0.04 10*3/uL (ref 0.00–0.07)
Basophils Absolute: 0 10*3/uL (ref 0.0–0.1)
Basophils Relative: 0 %
Eosinophils Absolute: 0 10*3/uL (ref 0.0–0.5)
Eosinophils Relative: 0 %
HCT: 27 % — ABNORMAL LOW (ref 39.0–52.0)
Hemoglobin: 9 g/dL — ABNORMAL LOW (ref 13.0–17.0)
Immature Granulocytes: 1 %
Lymphocytes Relative: 4 %
Lymphs Abs: 0.3 10*3/uL — ABNORMAL LOW (ref 0.7–4.0)
MCH: 29.8 pg (ref 26.0–34.0)
MCHC: 33.3 g/dL (ref 30.0–36.0)
MCV: 89.4 fL (ref 80.0–100.0)
Monocytes Absolute: 0.1 10*3/uL (ref 0.1–1.0)
Monocytes Relative: 1 %
Neutro Abs: 7.5 10*3/uL (ref 1.7–7.7)
Neutrophils Relative %: 94 %
Platelets: 249 10*3/uL (ref 150–400)
RBC: 3.02 MIL/uL — ABNORMAL LOW (ref 4.22–5.81)
RDW: 13.8 % (ref 11.5–15.5)
WBC: 7.9 10*3/uL (ref 4.0–10.5)
nRBC: 0 % (ref 0.0–0.2)

## 2022-01-29 LAB — COMPREHENSIVE METABOLIC PANEL
ALT: 9 U/L (ref 0–44)
AST: 16 U/L (ref 15–41)
Albumin: 2.6 g/dL — ABNORMAL LOW (ref 3.5–5.0)
Alkaline Phosphatase: 52 U/L (ref 38–126)
Anion gap: 11 (ref 5–15)
BUN: 26 mg/dL — ABNORMAL HIGH (ref 8–23)
CO2: 19 mmol/L — ABNORMAL LOW (ref 22–32)
Calcium: 8.3 mg/dL — ABNORMAL LOW (ref 8.9–10.3)
Chloride: 105 mmol/L (ref 98–111)
Creatinine, Ser: 1.3 mg/dL — ABNORMAL HIGH (ref 0.61–1.24)
GFR, Estimated: 56 mL/min — ABNORMAL LOW (ref >60)
Glucose, Bld: 121 mg/dL — ABNORMAL HIGH (ref 70–99)
Potassium: 3.6 mmol/L (ref 3.5–5.1)
Sodium: 135 mmol/L (ref 135–145)
Total Bilirubin: 0.5 mg/dL (ref 0.3–1.2)
Total Protein: 6.3 g/dL — ABNORMAL LOW (ref 6.5–8.1)

## 2022-01-29 LAB — LACTIC ACID, PLASMA
Lactic Acid, Venous: 2.5 mmol/L (ref 0.5–1.9)
Lactic Acid, Venous: 2.2 mmol/L (ref 0.5–1.9)

## 2022-01-29 LAB — APTT: aPTT: 34 seconds (ref 24–36)

## 2022-01-29 LAB — RESP PANEL BY RT-PCR (FLU A&B, COVID) ARPGX2
Influenza A by PCR: NEGATIVE
Influenza B by PCR: NEGATIVE
SARS Coronavirus 2 by RT PCR: NEGATIVE

## 2022-01-29 LAB — POC OCCULT BLOOD, ED: Fecal Occult Bld: NEGATIVE

## 2022-01-29 LAB — CBG MONITORING, ED: Glucose-Capillary: 128 mg/dL — ABNORMAL HIGH (ref 70–99)

## 2022-01-29 MED ORDER — VANCOMYCIN HCL IN DEXTROSE 1-5 GM/200ML-% IV SOLN
1000.0000 mg | Freq: Once | INTRAVENOUS | Status: AC
  Administered 2022-01-29: 1000 mg via INTRAVENOUS
  Filled 2022-01-29: qty 200

## 2022-01-29 MED ORDER — FENTANYL CITRATE PF 50 MCG/ML IJ SOSY
25.0000 ug | PREFILLED_SYRINGE | Freq: Once | INTRAMUSCULAR | Status: AC
  Administered 2022-01-29: 25 ug via INTRAVENOUS
  Filled 2022-01-29: qty 1

## 2022-01-29 MED ORDER — ONDANSETRON HCL 4 MG/2ML IJ SOLN
4.0000 mg | Freq: Once | INTRAMUSCULAR | Status: AC
  Administered 2022-01-29: 4 mg via INTRAVENOUS
  Filled 2022-01-29: qty 2

## 2022-01-29 MED ORDER — SODIUM CHLORIDE 0.9 % IV SOLN
250.0000 mL | INTRAVENOUS | Status: DC
  Administered 2022-01-31 – 2022-02-01 (×3): 250 mL via INTRAVENOUS

## 2022-01-29 MED ORDER — IOHEXOL 350 MG/ML SOLN
75.0000 mL | Freq: Once | INTRAVENOUS | Status: AC | PRN
  Administered 2022-01-29: 75 mL via INTRAVENOUS

## 2022-01-29 MED ORDER — NOREPINEPHRINE 4 MG/250ML-% IV SOLN
2.0000 ug/min | INTRAVENOUS | Status: DC
  Administered 2022-01-29: 2 ug/min via INTRAVENOUS
  Administered 2022-01-30: 8 ug/min via INTRAVENOUS
  Administered 2022-01-31: 2 ug/min via INTRAVENOUS
  Filled 2022-01-29 (×3): qty 250

## 2022-01-29 MED ORDER — PANTOPRAZOLE SODIUM 40 MG IV SOLR
40.0000 mg | Freq: Once | INTRAVENOUS | Status: AC
  Administered 2022-01-29: 40 mg via INTRAVENOUS
  Filled 2022-01-29: qty 10

## 2022-01-29 MED ORDER — LACTATED RINGERS IV SOLN: INTRAVENOUS | Status: DC

## 2022-01-29 MED ORDER — METRONIDAZOLE 500 MG/100ML IV SOLN
500.0000 mg | Freq: Once | INTRAVENOUS | Status: AC
  Administered 2022-01-29: 500 mg via INTRAVENOUS
  Filled 2022-01-29: qty 100

## 2022-01-29 MED ORDER — LACTATED RINGERS IV BOLUS (SEPSIS)
1000.0000 mL | Freq: Once | INTRAVENOUS | Status: AC
  Administered 2022-01-29: 1000 mL via INTRAVENOUS

## 2022-01-29 MED ORDER — SODIUM CHLORIDE 0.9 % IV SOLN
2.0000 g | Freq: Once | INTRAVENOUS | Status: AC
  Administered 2022-01-29: 2 g via INTRAVENOUS
  Filled 2022-01-29: qty 12.5

## 2022-01-29 MED ORDER — VANCOMYCIN HCL IN DEXTROSE 1-5 GM/200ML-% IV SOLN: 1000.0000 mg | INTRAVENOUS | Status: DC

## 2022-01-29 MED ORDER — SODIUM CHLORIDE 0.9 % IV SOLN: 2.0000 g | Freq: Two times a day (BID) | INTRAVENOUS | Status: DC

## 2022-01-29 MED ORDER — LACTATED RINGERS IV BOLUS
500.0000 mL | Freq: Once | INTRAVENOUS | Status: AC
  Administered 2022-01-29: 500 mL via INTRAVENOUS

## 2022-01-29 NOTE — Consult Note (Incomplete)
Consulting Physician: Nickola Major Dinesh Ulysse  Referring Provider: Dr. Maylon Peppers - ER Provider  Chief Complaint: Fever, shortness of breath  Reason for Consult: Possible small bowel obstruction on CT   Subjective   HPI: Erik Agustin. is an 81 y.o. male who is here for fever and shortness of breath.  He has been feeling unwell.  Had a fever and cough.  Decreased appetite.  Has had a bowel movement while in the ER.  Mild abdominal pain.  No diarrhea or vomiting.    Past Medical History:  Diagnosis Date  . Adenomatous colon polyp   . Aortic aneurysm ([place])   . BPH (benign prostatic hypertrophy)   . CAD (coronary artery disease)   . Carotid artery stenosis    a. Bilateral CEA  . Cataract   . CHF (congestive heart failure) ([place])   . Chronic systolic heart failure (HCC)    a. EF 20-25%, mild LVH, mod HK, mid apicalanteroseptal myocardium, mild MR, LA mod dilated  . Collagen vascular disease ([place])   . COPD (chronic obstructive pulmonary disease) ([place])   . Coronary artery disease    a. LHC (08/2013): Lmain: short 30% distal, LAD: sml D1 & D2, 70% ostial D1, 95-99% LAD stenosis prox D2 LCx: sml/mod ramus subtot. occluded, 40% ostial set off lg OM1, 40% AV LCx after OM1, RCA: 90% prox (DES to RCA and prox LAD)  . Diverticulosis   . GERD (gastroesophageal reflux disease)   . Heart murmur   . History of colonic polyps   . Hyperlipidemia   . Hyperplastic colon polyp   . Hypertension   . Ischemic cardiomyopathy   . RLS (restless legs syndrome)     Past Surgical History:  Procedure Laterality Date  . cardiac stents  09-2013  . CAROTID ENDARTERECTOMY  04/17/2008   right  . CAROTID ENDARTERECTOMY  05/30/08   Left  . CATARACT EXTRACTION W/PHACO Right 10/05/2018   Procedure: CATARACT EXTRACTION PHACO AND INTRAOCULAR LENS PLACEMENT ([place]), RIGHT;  Surgeon: Birder Robson, MD;  Location: [facility] ORS;  Service: Ophthalmology;  Laterality: Right;  [place] 01:06.4 cde 12.79 Fluid Pack Lot #  [id number]  . CATARACT EXTRACTION W/PHACO Left 11/02/2018   Procedure: CATARACT EXTRACTION PHACO AND INTRAOCULAR LENS PLACEMENT ([place]), LEFT;  Surgeon: Birder Robson, MD;  Location: [facility] ORS;  Service: Ophthalmology;  Laterality: Left;  [place]  01:10 CDE 11.52 Fluid pack lot # [id number]  . CHOLECYSTECTOMY     Gall Bladder  . CORONARY ANGIOGRAM  09/11/2013   Procedure: CORONARY ANGIOGRAM;  Surgeon: Troy Sine, MD;  Location: [facility] CATH LAB;  Service: Cardiovascular;;  . CORONARY ANGIOPLASTY     STENTS  . HERNIA REPAIR    . lower aorta aneurysm  05/26/2015   [facility]  . PERCUTANEOUS STENT INTERVENTION  09/11/2013   Procedure: PERCUTANEOUS STENT INTERVENTION;  Surgeon: Troy Sine, MD;  Location: [facility] CATH LAB;  Service: Cardiovascular;;  DES Prox RCA 3.5x15 xience     Family History  Problem Relation Age of Onset  . Heart disease Brother   . Hyperlipidemia Brother   . Pancreatic cancer Brother   . Hypertension Mother   . Cancer Father        unknown type; sounds GI  . Emphysema Father   . Hyperlipidemia Brother   . Hyperlipidemia Brother   . Heart attack Brother   . Hyperlipidemia Brother   . Multiple sclerosis Brother   . Kidney disease Neg Hx   . Prostate cancer Neg Hx   .  Colon cancer Neg Hx   . Esophageal cancer Neg Hx   . Rectal cancer Neg Hx   . Stomach cancer Neg Hx     Social:  reports that he has been smoking cigarettes. He has a 50.00 pack-year smoking history. He quit smokeless tobacco use about 8 years ago. He reports that he does not drink alcohol and does not use drugs.  Allergies:  Allergies  Allergen Reactions  . Codeine Swelling    throat swells    Medications: Current Outpatient Medications  Medication Instructions  . acetaminophen (TYLENOL) 500 mg, Oral, Every 6 hours PRN  . aspirin EC 81 mg, Oral, Every evening  . clopidogrel (PLAVIX) 75 MG tablet TAKE 1 TABLET BY MOUTH EVERY DAY  . cyanocobalamin (VITAMIN B12) 1,000 mcg, Oral, Daily  . dapagliflozin  propanediol (FARXIGA) 10 mg, Oral, Daily before breakfast  . ENTRESTO 24-26 MG TAKE 1 TABLET BY MOUTH TWICE A DAY  . ezetimibe-simvastatin (VYTORIN) 10-40 MG tablet TAKE 1 TABLET BY MOUTH EVERYDAY AT BEDTIME  . famotidine (PEPCID) 20 mg, Oral, Daily at bedtime  . ferrous sulfate 325 mg, Oral, Daily with breakfast  . methocarbamol (ROBAXIN) 500 MG tablet TAKE 1 TABLET (500 MG TOTAL) BY MOUTH EVERY DAY AT BEDTIME AS NEEDED FOR MUSCLE SPASM  . metoprolol succinate (TOPROL-XL) 50 MG 24 hr tablet TAKE 1 TABLET BY MOUTH EVERY DAY WITH OR IMMEDIATELY FOLLOWING MEAL (NEEDS OFFICE VISIT)  . nitroGLYCERIN (NITROSTAT) 0.4 mg, Sublingual, Every 5 min x3 PRN  . Omega-3 Fatty Acids (FISH OIL) 1000 MG CAPS 1 capsule, Oral, Daily  . rOPINIRole (REQUIP) 3 MG tablet TAKE 1/2 TABLET BY MOUTH IN THE MORNING & TAKE 1 TABLET AT NIGHT  . spironolactone (ALDACTONE) 25 MG tablet TAKE 1/2 TABLET BY MOUTH EVERY DAY  . tamsulosin (FLOMAX) 0.4 MG CAPS capsule TAKE 1 CAPSULE BY MOUTH EVERY DAY    ROS - all of the below systems have been reviewed with the patient and positives are indicated with bold text General: chills, fever or night sweats Eyes: blurry vision or double vision ENT: epistaxis or sore throat Allergy/Immunology: itchy/watery eyes or nasal congestion Hematologic/Lymphatic: bleeding problems, blood clots or swollen lymph nodes Endocrine: temperature intolerance or unexpected weight changes Breast: new or changing breast lumps or nipple discharge Resp: cough, shortness of breath, or wheezing CV: chest pain or dyspnea on exertion GI: as per HPI GU: dysuria, trouble voiding, or hematuria MSK: joint pain or joint stiffness Neuro: TIA or stroke symptoms Derm: pruritus and skin lesion changes Psych: anxiety and depression  Objective   PE Blood pressure (!) 111/57, pulse 97, temperature 99.2 F (37.3 C), temperature source Oral, resp. rate 19, height '5\' 6"'$  (1.676 m), weight 49.9 kg, SpO2 96  %. Constitutional: NAD; conversant; no deformities Eyes: Moist conjunctiva; no lid lag; anicteric; PERRL Neck: Trachea midline; no thyromegaly Lungs: Normal respiratory effort; no tactile fremitus CV: RRR; no palpable thrills; no pitting edema GI: Abd ***; no palpable hepatosplenomegaly MSK: ***Normal range of motion of extremities; no clubbing/cyanosis Psychiatric: Appropriate affect; alert and oriented x3 Lymphatic: No palpable cervical or axillary lymphadenopathy  Results for orders placed or performed during the hospital encounter of 01/29/22 (from the past 24 hour(s))  CBG monitoring, ED     Status: Abnormal   Collection Time: 01/29/22  8:05 PM  Result Value Ref Range   Glucose-Capillary 128 (H) 70 - 99 mg/dL  Lactic acid, plasma     Status: Abnormal   Collection Time: 01/29/22  8:11  PM  Result Value Ref Range   Lactic Acid, Venous 2.5 (HH) 0.5 - 1.9 mmol/L  Comprehensive metabolic panel     Status: Abnormal   Collection Time: 01/29/22  8:11 PM  Result Value Ref Range   Sodium 135 135 - 145 mmol/L   Potassium 3.6 3.5 - 5.1 mmol/L   Chloride 105 98 - 111 mmol/L   CO2 19 (L) 22 - 32 mmol/L   Glucose, Bld 121 (H) 70 - 99 mg/dL   BUN 26 (H) 8 - 23 mg/dL   Creatinine, Ser 1.30 (H) 0.61 - 1.24 mg/dL   Calcium 8.3 (L) 8.9 - 10.3 mg/dL   Total Protein 6.3 (L) 6.5 - 8.1 g/dL   Albumin 2.6 (L) 3.5 - 5.0 g/dL   AST 16 15 - 41 U/L   ALT 9 0 - 44 U/L   Alkaline Phosphatase 52 38 - 126 U/L   Total Bilirubin 0.5 0.3 - 1.2 mg/dL   GFR, Estimated 56 (L) >60 mL/min   Anion gap 11 5 - 15  CBC with Differential     Status: Abnormal   Collection Time: 01/29/22  8:11 PM  Result Value Ref Range   WBC 7.9 4.0 - 10.5 K/uL   RBC 3.02 (L) 4.22 - 5.81 MIL/uL   Hemoglobin 9.0 (L) 13.0 - 17.0 g/dL   HCT 27.0 (L) 39.0 - 52.0 %   MCV 89.4 80.0 - 100.0 fL   MCH 29.8 26.0 - 34.0 pg   MCHC 33.3 30.0 - 36.0 g/dL   RDW 13.8 11.5 - 15.5 %   Platelets 249 150 - 400 K/uL   nRBC 0.0 0.0 - 0.2 %    Neutrophils Relative % 94 %   Neutro Abs 7.5 1.7 - 7.7 K/uL   Lymphocytes Relative 4 %   Lymphs Abs 0.3 (L) 0.7 - 4.0 K/uL   Monocytes Relative 1 %   Monocytes Absolute 0.1 0.1 - 1.0 K/uL   Eosinophils Relative 0 %   Eosinophils Absolute 0.0 0.0 - 0.5 K/uL   Basophils Relative 0 %   Basophils Absolute 0.0 0.0 - 0.1 K/uL   Immature Granulocytes 1 %   Abs Immature Granulocytes 0.04 0.00 - 0.07 K/uL  Protime-INR     Status: Abnormal   Collection Time: 01/29/22  8:11 PM  Result Value Ref Range   Prothrombin Time 17.2 (H) 11.4 - 15.2 seconds   INR 1.4 (H) 0.8 - 1.2  APTT     Status: None   Collection Time: 01/29/22  8:11 PM  Result Value Ref Range   aPTT 34 24 - 36 seconds  Lipase, blood     Status: None   Collection Time: 01/29/22  8:11 PM  Result Value Ref Range   Lipase 29 11 - 51 U/L  Blood Culture (routine x 2)     Status: None (Preliminary result)   Collection Time: 01/29/22  8:12 PM   Specimen: BLOOD RIGHT FOREARM  Result Value Ref Range   Specimen Description BLOOD RIGHT FOREARM    Special Requests      BOTTLES DRAWN AEROBIC AND ANAEROBIC Blood Culture results may not be optimal due to an excessive volume of blood received in culture bottles Performed at [facility], [address], [place], [place] [address]    Culture PENDING    Report Status PENDING   Resp Panel by RT-PCR (Flu A&B, Covid) Anterior Nasal Swab     Status: None   Collection Time: 01/29/22  8:15  PM   Specimen: Anterior Nasal Swab  Result Value Ref Range   SARS Coronavirus 2 by RT PCR NEGATIVE NEGATIVE   Influenza A by PCR NEGATIVE NEGATIVE   Influenza B by PCR NEGATIVE NEGATIVE  Urinalysis, Routine w reflex microscopic Urine, Clean Catch     Status: Abnormal   Collection Time: 01/29/22  8:32 PM  Result Value Ref Range   Color, Urine YELLOW YELLOW   APPearance HAZY (A) CLEAR   Specific Gravity, Urine 1.014 1.005 - 1.030   pH 5.0 5.0 - 8.0   Glucose, UA >=500 (A) NEGATIVE mg/dL   Hgb urine  dipstick NEGATIVE NEGATIVE   Bilirubin Urine NEGATIVE NEGATIVE   Ketones, ur NEGATIVE NEGATIVE mg/dL   Protein, ur 30 (A) NEGATIVE mg/dL   Nitrite NEGATIVE NEGATIVE   Leukocytes,Ua NEGATIVE NEGATIVE   RBC / HPF 0-5 0 - 5 RBC/hpf   WBC, UA 0-5 0 - 5 WBC/hpf   Bacteria, UA NONE SEEN NONE SEEN   Squamous Epithelial / LPF 0-5 0 - 5   Mucus PRESENT   POC occult blood, ED RN will collect     Status: None   Collection Time: 01/29/22  9:47 PM  Result Value Ref Range   Fecal Occult Bld NEGATIVE NEGATIVE  Lactic acid, plasma     Status: Abnormal   Collection Time: 01/29/22 10:17 PM  Result Value Ref Range   Lactic Acid, Venous 2.2 (HH) 0.5 - 1.9 mmol/L     Imaging Orders         DG Chest Port 1 View         CT Abdomen Pelvis W Contrast     Mildly dilated and fluid-filled small bowel loops continue to the pelvis. Distal small bowel is decompressed. Cannot exclude mild distal small bowel obstruction.  Assessment and Plan   Erik [name]. is an 81 y.o. male with fever and shortness of breath requiring a surgery consultation for possible bowel obstruction on CT.  ***    ICD-10-CM   1. Sepsis without acute organ dysfunction, due to unspecified organism ([place])  A41.9     2. Anemia, unspecified type  D64.9     3. SBO (small bowel obstruction) ([place])  K56.609        Felicie Morn, MD  [facility] Use [email or web address] to contact on call provider  New Patient Billing: {[phone]}

## 2022-01-29 NOTE — Progress Notes (Signed)
Pharmacy Antibiotic Note  Erik [name]. is a 81 y.o. male for which pharmacy has been consulted for cefepime and vancomycin dosing for sepsis.  Patient with a history of HF, CAD s/p stenting, HTN, COPD. Patient presenting with fever and cough.  SCr 1.3 - up from 0.82-1 in September WBC 7.9; LA 2.5; T 99.2; HR 100; RR 20 COVID/Flu - neg  Plan: Metronidazole per MD Cefepime 2g q12hr Vancomycin 1000 mg q48hr (eAUC 449.7) unless change in renal function Trend WBC, Fever, Renal function F/u cultures, clinical course, WBC, fever De-escalate when able  Height: '5\' 6"'$  (167.6 cm) Weight: 49.9 kg (110 lb) IBW/kg (Calculated) : 63.8  Temp (24hrs), Avg:103 F (39.4 C), Min:103 F (39.4 C), Max:103 F (39.4 C)  No results for input(s): "WBC", "CREATININE", "LATICACIDVEN", "VANCOTROUGH", "VANCOPEAK", "VANCORANDOM", "GENTTROUGH", "GENTPEAK", "GENTRANDOM", "TOBRATROUGH", "TOBRAPEAK", "TOBRARND", "AMIKACINPEAK", "AMIKACINTROU", "AMIKACIN" in the last 168 hours.  CrCl cannot be calculated (Patient's most recent lab result is older than the maximum 21 days allowed.).    Allergies  Allergen Reactions   Codeine Swelling    throat swells    Antimicrobials this admission: vancomycin 11/17 >>  cefepime 11/17 >>  flagyl 11/17 >>   Microbiology results: Pending  Thank you for allowing pharmacy to be a part of this patient's care.  Lorelei Pont, PharmD, BCPS 01/29/2022 8:20 PM ED Clinical Pharmacist -  [phone]

## 2022-01-29 NOTE — H&P (Addendum)
NAME:  Erik Cathey., MRN:  [id number], DOB:  1940-06-13, LOS: 0 ADMISSION DATE:  01/29/2022, CONSULTATION DATE: 11/17 REFERRING MD: Dr. Maylon Peppers, CHIEF COMPLAINT: Shock  History of Present Illness:  81 year old male with past medical history as below, which is significant for ischemic cardiomyopathy, CAD status post PCI, heart failure reduced ejection fraction, COPD, and aortic aneurysm.  He was in his usual state of health until 11/17 when he developed chills, cough, and mild abdominal pain.  Denies productive cough, nausea, vomiting, diarrhea.  No other complaints.  Upon arrival to the emergency department he was noted to be hypotensive refractory to IV fluid resuscitation and was started on norepinephrine infusion.  Work-up included chest x-ray which was clear and CT abdomen and pelvis which was concerning for possible early small bowel obstruction.  Laboratory evaluation was significant for serum creatinine of 1.3, lactic acid 2.5, and hemoglobin 9.  PCCM was requested to admit to ICU for vasopressor management.  Pertinent  Medical History   has a past medical history of Adenomatous colon polyp, Aortic aneurysm ([place]), BPH (benign prostatic hypertrophy), CAD (coronary artery disease), Carotid artery stenosis, Cataract, CHF (congestive heart failure) ([place]), Chronic systolic heart failure ([place]), Collagen vascular disease ([place]), COPD (chronic obstructive pulmonary disease) ([place]), Coronary artery disease, Diverticulosis, GERD (gastroesophageal reflux disease), Heart murmur, History of colonic polyps, Hyperlipidemia, Hyperplastic colon polyp, Hypertension, Ischemic cardiomyopathy, and RLS (restless legs syndrome).   Significant Hospital Events: Including procedures, antibiotic start and stop dates in addition to other pertinent events   11/17 admitted for shock on pressors  Interim History / Subjective:    Objective   Blood pressure (!) 106/52, pulse 93, temperature 99.2 F (37.3 C),  temperature source Oral, resp. rate 19, height '5\' 6"'$  (1.676 m), weight 49.9 kg, SpO2 99 %.       No intake or output data in the 24 hours ending 01/29/22 2352 Filed Weights   01/29/22 2016  Weight: 49.9 kg    Examination: General: Thin elderly male in no acute distress HENT: Normocephalic, atraumatic, PERRL, no JVD Lungs: Clear bilateral breath sounds Cardiovascular: Regular rate and rhythm Abdomen: Soft, mild generalized tenderness, nondistended.  Hypoactive bowel sounds. Extremities: No acute deformity or range of motion limitation Neuro: Alert, oriented, nonfocal  Resolved Hospital Problem list     Assessment & Plan:   Shock: Etiology uncertain. Patient complaining of abdominal pain but no clear focus for sepsis including CT abdomen and pelvis.  Cannot rule out sepsis. I suspect he is somewhat hypovolemic.  He does not appear to be volume overloaded either so I suspect this is not cardiogenic shock despite history. Lactic acid 2.5 > 2.2 - Admit to ICU - Norepinephrine infusion for MAP goal 65 (3 mcg via US guided IV currently with MAP 69) - Empiric antibiotics - Echocardiogram - s/p 2L IVF in ED. Continue LR at 162m/Hr - Holding diuretics - Check PCT, RVP, blood cx  Possible small bowel obstruction: described as a possible early obstruction on CT. Patient has had 3 bowel movements in the ED. No diarrhea. One episode appeared dark/tarry but hemoccult negative.  - Surgery consult pending.  - NPO  Chronic HFrEF (LVEF 40-45% on last echo) ICM CAD - repeat echo - Holding home entresto, farxiga, asa, vytorin, metoprolol, spironolactone while NPO - telemetry monitoring.   AKI in the setting of shock Hypokalemia Lactic acidosis - Gentle hydration - Support BP as above - replace K with goal 4 - Trend chemistry, lactic.   Anemia:  hemoglobin 9, which is somewhat down from 11 in July. Hemoccult negative. No other signs of bleeding.  - Trend CBC  Best Practice (right click  and "Reselect all SmartList Selections" daily)   Diet/type: NPO DVT prophylaxis: prophylactic heparin  GI prophylaxis: PPI Lines: N/A Foley:  N/A Code Status:  full code Last date of multidisciplinary goals of care discussion '[ ]'$   Labs   CBC: Recent Labs  Lab 01/29/22 2011  WBC 7.9  NEUTROABS 7.5  HGB 9.0*  HCT 27.0*  MCV 89.4  PLT 956    Basic Metabolic Panel: Recent Labs  Lab 01/29/22 2011  NA 135  K 3.6  CL 105  CO2 19*  GLUCOSE 121*  BUN 26*  CREATININE 1.30*  CALCIUM 8.3*   GFR: Estimated Creatinine Clearance: 32 mL/min (A) (by C-G formula based on SCr of 1.3 mg/dL (H)). Recent Labs  Lab 01/29/22 2011 01/29/22 2217  WBC 7.9  --   LATICACIDVEN 2.5* 2.2*    Liver Function Tests: Recent Labs  Lab 01/29/22 2011  AST 16  ALT 9  ALKPHOS 52  BILITOT 0.5  PROT 6.3*  ALBUMIN 2.6*   Recent Labs  Lab 01/29/22 2011  LIPASE 29   No results for input(s): "AMMONIA" in the last 168 hours.  ABG    Component Value Date/Time   HCO3 23.1 09/10/2013 0220   TCO2 24 09/10/2013 0220   ACIDBASEDEF 1.0 09/10/2013 0220   O2SAT 78.0 09/10/2013 0220     Coagulation Profile: Recent Labs  Lab 01/29/22 2011  INR 1.4*    Cardiac Enzymes: No results for input(s): "CKTOTAL", "CKMB", "CKMBINDEX", "TROPONINI" in the last 168 hours.  HbA1C: Hgb A1c MFr Bld  Date/Time Value Ref Range Status  07/07/2021 09:51 AM 6.0 4.6 - 6.5 % Final    Comment:    Glycemic Control Guidelines for People with Diabetes:Non Diabetic:  <6%Goal of Therapy: <7%Additional Action Suggested:  >8%   07/03/2020 09:06 AM 5.6 4.6 - 6.5 % Final    Comment:    Glycemic Control Guidelines for People with Diabetes:Non Diabetic:  <6%Goal of Therapy: <7%Additional Action Suggested:  >8%     CBG: Recent Labs  Lab 01/29/22 2005  GLUCAP 128*    Review of Systems:   Bolds are positive  Constitutional: weight loss, gain, night sweats, Fevers, chills, fatigue .  HEENT: headaches, Sore  throat, sneezing, nasal congestion, post nasal drip, Difficulty swallowing, Tooth/dental problems, visual complaints visual changes, ear ache CV:  chest pain, radiates:,Orthopnea, PND, swelling in lower extremities, dizziness, palpitations, syncope.  GI  heartburn, indigestion, abdominal pain, nausea, vomiting, diarrhea, change in bowel habits, loss of appetite, bloody stools.  Resp: cough, productive: , hemoptysis, dyspnea, chest pain, pleuritic.  Skin: rash or itching or icterus GU: dysuria, change in color of urine, urgency or frequency. flank pain, hematuria  MS: joint pain or swelling. decreased range of motion  Psych: change in mood or affect. depression or anxiety.  Neuro: difficulty with speech, weakness, numbness, ataxia    Past Medical History:  He,  has a past medical history of Adenomatous colon polyp, Aortic aneurysm ([place]), BPH (benign prostatic hypertrophy), CAD (coronary artery disease), Carotid artery stenosis, Cataract, CHF (congestive heart failure) ([facility]), Chronic systolic heart failure ([place]), Collagen vascular disease ([place]), COPD (chronic obstructive pulmonary disease) ([place]), Coronary artery disease, Diverticulosis, GERD (gastroesophageal reflux disease), Heart murmur, History of colonic polyps, Hyperlipidemia, Hyperplastic colon polyp, Hypertension, Ischemic cardiomyopathy, and RLS (restless legs syndrome).   Surgical History:   Past  Surgical History:  Procedure Laterality Date   cardiac stents  09-2013   CAROTID ENDARTERECTOMY  04/17/2008   right   CAROTID ENDARTERECTOMY  05/30/08   Left   CATARACT EXTRACTION W/PHACO Right 10/05/2018   Procedure: CATARACT EXTRACTION PHACO AND INTRAOCULAR LENS PLACEMENT ([place]), RIGHT;  Surgeon: Birder Robson, MD;  Location: [facility] ORS;  Service: Ophthalmology;  Laterality: Right;  [place] 01:06.4 cde 12.79 Fluid Pack Lot # [id number]   CATARACT EXTRACTION W/PHACO Left 11/02/2018   Procedure: CATARACT EXTRACTION PHACO AND INTRAOCULAR LENS  PLACEMENT ([place]), LEFT;  Surgeon: Birder Robson, MD;  Location: [facility] ORS;  Service: Ophthalmology;  Laterality: Left;  [place]  01:10 CDE 11.52 Fluid pack lot # [id number]   CHOLECYSTECTOMY     Gall Bladder   CORONARY ANGIOGRAM  09/11/2013   Procedure: CORONARY ANGIOGRAM;  Surgeon: Troy Sine, MD;  Location: [facility] CATH LAB;  Service: Cardiovascular;;   CORONARY ANGIOPLASTY     STENTS   HERNIA REPAIR     lower aorta aneurysm  05/26/2015   [facility]   PERCUTANEOUS STENT INTERVENTION  09/11/2013   Procedure: PERCUTANEOUS STENT INTERVENTION;  Surgeon: Troy Sine, MD;  Location: [facility] CATH LAB;  Service: Cardiovascular;;  DES Prox RCA 3.5x15 xience      Social History:   reports that he has been smoking cigarettes. He has a 50.00 pack-year smoking history. He quit smokeless tobacco use about 8 years ago. He reports that he does not drink alcohol and does not use drugs.   Family History:  His family history includes Cancer in his father; Emphysema in his father; Heart attack in his brother; Heart disease in his brother; Hyperlipidemia in his brother, brother, brother, and brother; Hypertension in his mother; Multiple sclerosis in his brother; Pancreatic cancer in his brother. There is no history of Kidney disease, Prostate cancer, Colon cancer, Esophageal cancer, Rectal cancer, or Stomach cancer.   Allergies Allergies  Allergen Reactions   Codeine Swelling    throat swells     Home Medications  Prior to Admission medications   Medication Sig Start Date End Date Taking? Authorizing Provider  methocarbamol (ROBAXIN) 500 MG tablet TAKE 1 TABLET (500 MG TOTAL) BY MOUTH EVERY DAY AT BEDTIME AS NEEDED FOR MUSCLE SPASM 12/29/21   Bedsole, Amy E, MD  acetaminophen (TYLENOL) 500 MG tablet Take 500 mg by mouth every 6 (six) hours as needed (for pain.).    [provider]  aspirin EC 81 MG tablet Take 81 mg by mouth every evening.     [provider]  clopidogrel (PLAVIX) 75 MG tablet TAKE  1 TABLET BY MOUTH EVERY DAY 11/30/21   Larey Dresser, MD  cyanocobalamin (VITAMIN B12) 1000 MCG tablet Take 1,000 mcg by mouth daily.    [provider]  dapagliflozin propanediol (FARXIGA) 10 MG TABS tablet Take 1 tablet (10 mg total) by mouth daily before breakfast. 11/27/21   Larey Dresser, MD  ENTRESTO 24-26 MG TAKE 1 TABLET BY MOUTH TWICE A DAY 08/25/21   Larey Dresser, MD  ezetimibe-simvastatin (VYTORIN) 10-40 MG tablet TAKE 1 TABLET BY MOUTH EVERYDAY AT BEDTIME 07/17/21   Bedsole, Amy E, MD  famotidine (PEPCID) 20 MG tablet Take 1 tablet (20 mg total) by mouth at bedtime. 01/30/19   Pyrtle, Lajuan Lines, MD  ferrous sulfate 325 (65 FE) MG tablet Take 325 mg by mouth daily with breakfast.    [provider]  metoprolol succinate (TOPROL-XL) 50 MG 24 hr tablet TAKE 1 TABLET  BY MOUTH EVERY DAY WITH OR IMMEDIATELY FOLLOWING MEAL (NEEDS OFFICE VISIT) 12/29/21   Larey Dresser, MD  nitroGLYCERIN (NITROSTAT) 0.4 MG SL tablet Place 1 tablet (0.4 mg total) under the tongue every 5 (five) minutes x 3 doses as needed for chest pain. 02/12/16   Bedsole, Amy E, MD  Omega-3 Fatty Acids (FISH OIL) 1000 MG CAPS Take 1 capsule by mouth daily.    [provider]  rOPINIRole (REQUIP) 3 MG tablet TAKE 1/2 TABLET BY MOUTH IN THE MORNING & TAKE 1 TABLET AT NIGHT 09/23/21   Bedsole, Amy E, MD  spironolactone (ALDACTONE) 25 MG tablet TAKE 1/2 TABLET BY MOUTH EVERY DAY 11/18/21   Larey Dresser, MD  tamsulosin (FLOMAX) 0.4 MG CAPS capsule TAKE 1 CAPSULE BY MOUTH EVERY DAY 08/24/21   [name], MD     Critical care time: 38 minutes     Georgann Housekeeper, AGACNP-BC [facility]  See Amion for personal pager PCCM on call pager [phone] until 7pm. Please call Elink 7p-7a. [phone]  01/30/2022 12:09 AM

## 2022-01-29 NOTE — Sepsis Progress Note (Signed)
Elink monitoring for the code sepsis protocol.  

## 2022-01-29 NOTE — Consult Note (Signed)
Consulting Physician: Nickola Major Roselle Norton  Referring Provider: Dr. Maylon Peppers - ER Provider  Chief Complaint: Fever, shortness of breath  Reason for Consult: Possible small bowel obstruction on CT   Subjective   HPI: Erik Reimers. is an 81 y.o. male who is here for fever and shortness of breath.  He has been feeling unwell.  Had a fever and cough.  Decreased appetite.  Has had a bowel movement while in the ER.  Also complains of abdominal pain.  No diarrhea or vomiting.    Past Medical History:  Diagnosis Date   Adenomatous colon polyp    Aortic aneurysm (HCC)    BPH (benign prostatic hypertrophy)    CAD (coronary artery disease)    Carotid artery stenosis    a. Bilateral CEA   Cataract    CHF (congestive heart failure) (H[facility])    Chronic systolic heart failure ([facility])    a. EF 20-25%, mild LVH, mod HK, mid apicalanteroseptal myocardium, mild MR, LA mod dilated   Collagen vascular disease (H[facility])    COPD (chronic obstructive pulmonary disease) ([place])    Coronary artery disease    a. LHC (08/2013): [place]: short 30% distal, LAD: sml D1 & D2, 70% ostial D1, 95-99% LAD stenosis prox D2 LCx: sml/mod ramus subtot. occluded, 40% ostial set off lg OM1, 40% AV LCx after OM1, RCA: 90% prox (DES to RCA and prox LAD)   Diverticulosis    GERD (gastroesophageal reflux disease)    Heart murmur    History of colonic polyps    Hyperlipidemia    Hyperplastic colon polyp    Hypertension    Ischemic cardiomyopathy    RLS (restless legs syndrome)     Past Surgical History:  Procedure Laterality Date   cardiac stents  09-2013   CAROTID ENDARTERECTOMY  04/17/2008   right   CAROTID ENDARTERECTOMY  05/30/08   Left   CATARACT EXTRACTION W/PHACO Right 10/05/2018   Procedure: CATARACT EXTRACTION PHACO AND INTRAOCULAR LENS PLACEMENT ([place]), RIGHT;  Surgeon: Birder Robson, MD;  Location: [facility] ORS;  Service: Ophthalmology;  Laterality: Right;  [place] 01:06.4 cde 12.79 Fluid Pack Lot # [id number]    CATARACT EXTRACTION W/PHACO Left 11/02/2018   Procedure: CATARACT EXTRACTION PHACO AND INTRAOCULAR LENS PLACEMENT ([place]), LEFT;  Surgeon: Birder Robson, MD;  Location: [facility] ORS;  Service: Ophthalmology;  Laterality: Left;  [place]  01:10 CDE 11.52 Fluid pack lot # [id number]   CHOLECYSTECTOMY     Gall Bladder   CORONARY ANGIOGRAM  09/11/2013   Procedure: CORONARY ANGIOGRAM;  Surgeon: Troy Sine, MD;  Location: [facility] CATH LAB;  Service: Cardiovascular;;   CORONARY ANGIOPLASTY     STENTS   HERNIA REPAIR     lower aorta aneurysm  05/26/2015   [facility]   PERCUTANEOUS STENT INTERVENTION  09/11/2013   Procedure: PERCUTANEOUS STENT INTERVENTION;  Surgeon: Troy Sine, MD;  Location: [facility] CATH LAB;  Service: Cardiovascular;;  DES Prox RCA 3.5x15 xience     Family History  Problem Relation Age of Onset   Heart disease Brother    Hyperlipidemia Brother    Pancreatic cancer Brother    Hypertension Mother    Cancer Father        unknown type; sounds GI   Emphysema Father    Hyperlipidemia Brother    Hyperlipidemia Brother    Heart attack Brother    Hyperlipidemia Brother    Multiple sclerosis Brother    Kidney disease Neg Hx    Prostate cancer  Neg Hx    Colon cancer Neg Hx    Esophageal cancer Neg Hx    Rectal cancer Neg Hx    Stomach cancer Neg Hx     Social:  reports that he has been smoking cigarettes. He has a 50.00 pack-year smoking history. He quit smokeless tobacco use about 8 years ago. He reports that he does not drink alcohol and does not use drugs.  Allergies:  Allergies  Allergen Reactions   Codeine Swelling    throat swells    Medications: Current Outpatient Medications  Medication Instructions   acetaminophen (TYLENOL) 500 mg, Oral, Every 6 hours PRN   aspirin EC 81 mg, Oral, Every evening   clopidogrel (PLAVIX) 75 MG tablet TAKE 1 TABLET BY MOUTH EVERY DAY   cyanocobalamin (VITAMIN B12) 1,000 mcg, Oral, Daily   dapagliflozin propanediol (FARXIGA) 10 mg, Oral, Daily  before breakfast   ENTRESTO 24-26 MG TAKE 1 TABLET BY MOUTH TWICE A DAY   ezetimibe-simvastatin (VYTORIN) 10-40 MG tablet TAKE 1 TABLET BY MOUTH EVERYDAY AT BEDTIME   famotidine (PEPCID) 20 mg, Oral, Daily at bedtime   ferrous sulfate 325 mg, Oral, Daily with breakfast   methocarbamol (ROBAXIN) 500 MG tablet TAKE 1 TABLET (500 MG TOTAL) BY MOUTH EVERY DAY AT BEDTIME AS NEEDED FOR MUSCLE SPASM   metoprolol succinate (TOPROL-XL) 50 MG 24 hr tablet TAKE 1 TABLET BY MOUTH EVERY DAY WITH OR IMMEDIATELY FOLLOWING MEAL (NEEDS OFFICE VISIT)   nitroGLYCERIN (NITROSTAT) 0.4 mg, Sublingual, Every 5 min x3 PRN   Omega-3 Fatty Acids (FISH OIL) 1000 MG CAPS 1 capsule, Oral, Daily   rOPINIRole (REQUIP) 3 MG tablet TAKE 1/2 TABLET BY MOUTH IN THE MORNING & TAKE 1 TABLET AT NIGHT   spironolactone (ALDACTONE) 25 MG tablet TAKE 1/2 TABLET BY MOUTH EVERY DAY   tamsulosin (FLOMAX) 0.4 MG CAPS capsule TAKE 1 CAPSULE BY MOUTH EVERY DAY    ROS - all of the below systems have been reviewed with the patient and positives are indicated with bold text General: chills, fever or night sweats Eyes: blurry vision or double vision ENT: epistaxis or sore throat Allergy/Immunology: itchy/watery eyes or nasal congestion Hematologic/Lymphatic: bleeding problems, blood clots or swollen lymph nodes Endocrine: temperature intolerance or unexpected weight changes Breast: new or changing breast lumps or nipple discharge Resp: cough, shortness of breath, or wheezing CV: chest pain or dyspnea on exertion GI: as per HPI GU: dysuria, trouble voiding, or hematuria MSK: joint pain or joint stiffness Neuro: TIA or stroke symptoms Derm: pruritus and skin lesion changes Psych: anxiety and depression  Objective   PE Blood pressure (!) 111/57, pulse 97, temperature 99.2 F (37.3 C), temperature source Oral, resp. rate 19, height '5\' 6"'$  (1.676 m), weight 49.9 kg, SpO2 96 %. Constitutional: NAD; conversant; no deformities Eyes: Moist  conjunctiva; no lid lag; anicteric; PERRL Neck: Trachea midline; no thyromegaly Lungs: Normal respiratory effort; no tactile fremitus CV: RRR; no palpable thrills; no pitting edema GI: Abd Soft, moderate tenderness throughout; no palpable hepatosplenomegaly MSK: Normal range of motion of extremities; no clubbing/cyanosis Psychiatric: Appropriate affect; alert and oriented x3 Lymphatic: No palpable cervical or axillary lymphadenopathy  Results for orders placed or performed during the hospital encounter of 01/29/22 (from the past 24 hour(s))  CBG monitoring, ED     Status: Abnormal   Collection Time: 01/29/22  8:05 PM  Result Value Ref Range   Glucose-Capillary 128 (H) 70 - 99 mg/dL  Lactic acid, plasma     Status:  Abnormal   Collection Time: 01/29/22  8:11 PM  Result Value Ref Range   Lactic Acid, Venous 2.5 (HH) 0.5 - 1.9 mmol/L  Comprehensive metabolic panel     Status: Abnormal   Collection Time: 01/29/22  8:11 PM  Result Value Ref Range   Sodium 135 135 - 145 mmol/L   Potassium 3.6 3.5 - 5.1 mmol/L   Chloride 105 98 - 111 mmol/L   CO2 19 (L) 22 - 32 mmol/L   Glucose, Bld 121 (H) 70 - 99 mg/dL   BUN 26 (H) 8 - 23 mg/dL   Creatinine, Ser 1.30 (H) 0.61 - 1.24 mg/dL   Calcium 8.3 (L) 8.9 - 10.3 mg/dL   Total Protein 6.3 (L) 6.5 - 8.1 g/dL   Albumin 2.6 (L) 3.5 - 5.0 g/dL   AST 16 15 - 41 U/L   ALT 9 0 - 44 U/L   Alkaline Phosphatase 52 38 - 126 U/L   Total Bilirubin 0.5 0.3 - 1.2 mg/dL   GFR, Estimated 56 (L) >60 mL/min   Anion gap 11 5 - 15  CBC with Differential     Status: Abnormal   Collection Time: 01/29/22  8:11 PM  Result Value Ref Range   WBC 7.9 4.0 - 10.5 K/uL   RBC 3.02 (L) 4.22 - 5.81 MIL/uL   Hemoglobin 9.0 (L) 13.0 - 17.0 g/dL   HCT 27.0 (L) 39.0 - 52.0 %   MCV 89.4 80.0 - 100.0 fL   MCH 29.8 26.0 - 34.0 pg   MCHC 33.3 30.0 - 36.0 g/dL   RDW 13.8 11.5 - 15.5 %   Platelets 249 150 - 400 K/uL   nRBC 0.0 0.0 - 0.2 %   Neutrophils Relative % 94 %   Neutro  Abs 7.5 1.7 - 7.7 K/uL   Lymphocytes Relative 4 %   Lymphs Abs 0.3 (L) 0.7 - 4.0 K/uL   Monocytes Relative 1 %   Monocytes Absolute 0.1 0.1 - 1.0 K/uL   Eosinophils Relative 0 %   Eosinophils Absolute 0.0 0.0 - 0.5 K/uL   Basophils Relative 0 %   Basophils Absolute 0.0 0.0 - 0.1 K/uL   Immature Granulocytes 1 %   Abs Immature Granulocytes 0.04 0.00 - 0.07 K/uL  Protime-INR     Status: Abnormal   Collection Time: 01/29/22  8:11 PM  Result Value Ref Range   Prothrombin Time 17.2 (H) 11.4 - 15.2 seconds   INR 1.4 (H) 0.8 - 1.2  APTT     Status: None   Collection Time: 01/29/22  8:11 PM  Result Value Ref Range   aPTT 34 24 - 36 seconds  Lipase, blood     Status: None   Collection Time: 01/29/22  8:11 PM  Result Value Ref Range   Lipase 29 11 - 51 U/L  Blood Culture (routine x 2)     Status: None (Preliminary result)   Collection Time: 01/29/22  8:12 PM   Specimen: BLOOD RIGHT FOREARM  Result Value Ref Range   Specimen Description BLOOD RIGHT FOREARM    Special Requests      BOTTLES DRAWN AEROBIC AND ANAEROBIC Blood Culture results may not be optimal due to an excessive volume of blood received in culture bottles Performed at [facility], [address] [place], [place] [address]    Culture PENDING    Report Status PENDING   Resp Panel by RT-PCR (Flu A&B, Covid) Anterior Nasal Swab     Status:  None   Collection Time: 01/29/22  8:15 PM   Specimen: Anterior Nasal Swab  Result Value Ref Range   SARS Coronavirus 2 by RT PCR NEGATIVE NEGATIVE   Influenza A by PCR NEGATIVE NEGATIVE   Influenza B by PCR NEGATIVE NEGATIVE  Urinalysis, Routine w reflex microscopic Urine, Clean Catch     Status: Abnormal   Collection Time: 01/29/22  8:32 PM  Result Value Ref Range   Color, Urine YELLOW YELLOW   APPearance HAZY (A) CLEAR   Specific Gravity, Urine 1.014 1.005 - 1.030   pH 5.0 5.0 - 8.0   Glucose, UA >=500 (A) NEGATIVE mg/dL   Hgb urine dipstick NEGATIVE NEGATIVE   Bilirubin  Urine NEGATIVE NEGATIVE   Ketones, ur NEGATIVE NEGATIVE mg/dL   Protein, ur 30 (A) NEGATIVE mg/dL   Nitrite NEGATIVE NEGATIVE   Leukocytes,Ua NEGATIVE NEGATIVE   RBC / HPF 0-5 0 - 5 RBC/hpf   WBC, UA 0-5 0 - 5 WBC/hpf   Bacteria, UA NONE SEEN NONE SEEN   Squamous Epithelial / LPF 0-5 0 - 5   Mucus PRESENT   POC occult blood, ED RN will collect     Status: None   Collection Time: 01/29/22  9:47 PM  Result Value Ref Range   Fecal Occult Bld NEGATIVE NEGATIVE  Lactic acid, plasma     Status: Abnormal   Collection Time: 01/29/22 10:17 PM  Result Value Ref Range   Lactic Acid, Venous 2.2 (HH) 0.5 - 1.9 mmol/L     Imaging Orders         DG Chest Port 1 View         CT Abdomen Pelvis W Contrast     Mildly dilated and fluid-filled small bowel loops continue to the pelvis. Distal small bowel is decompressed. Cannot exclude mild distal small bowel obstruction.  Assessment and Plan   Erik Maysonet. is an 81 y.o. male with fever and shortness of breath requiring a surgery consultation for possible bowel obstruction on CT.  I recommend bowel rest and resuscitation on the medical service.  He is on a small dose of levophed in the ER and hopefully that will wean off as he is resuscitated.  The CT findings are mild, and his exam is somewhat reassuring.  For now we will follow his exam and decide if any additional imaging is needed in the morning.    ICD-10-CM   1. Sepsis without acute organ dysfunction, due to unspecified organism ([place])  A41.9     2. Anemia, unspecified type  D64.9     3. SBO (small bowel obstruction) ([place])  K56.609        Felicie Morn, MD  [facility] Use [email or web address] to contact on call provider  New Patient Billing: [phone] - Moderate MDM

## 2022-01-29 NOTE — Progress Notes (Signed)
An USGPIV (ultrasound guided PIV) has been placed for short-term vasopressor infusion. A correctly placed ivWatch must be used when administering Vasopressors. Should this treatment be needed beyond 72 hours, central line access should be obtained.  It will be the responsibility of the bedside nurse to follow best practice to prevent extravasations.   ?

## 2022-01-29 NOTE — ED Notes (Signed)
Notified md kingsley of critical lactic 2.5

## 2022-01-29 NOTE — H&P (Incomplete)
NAME:  Erik Speciale., MRN:  [id number], DOB:  31-May-1940, LOS: 0 ADMISSION DATE:  01/29/2022, CONSULTATION DATE: 11/17 REFERRING MD: Dr. Maylon Peppers, CHIEF COMPLAINT: Shock  History of Present Illness:  81 year old male with past medical history as below, which is significant for ischemic cardiomyopathy, CAD status post PCI, heart failure reduced ejection fraction, COPD, and aortic aneurysm.  He was in his usual state of health until 11/17 when he developed chills, cough, and mild abdominal pain.  Denies productive cough, nausea, vomiting, diarrhea.  No other complaints.  Upon arrival to the emergency department he was noted to be hypotensive refractory to IV fluid resuscitation and was started on norepinephrine infusion.  Work-up included chest x-ray which was clear and CT abdomen and pelvis which was concerning for possible early small bowel obstruction.  Laboratory evaluation was significant for serum creatinine of 1.3, lactic acid 2.5, and hemoglobin 9.  PCCM was requested to admit to ICU for vasopressor management.  Pertinent  Medical History   has a past medical history of Adenomatous colon polyp, Aortic aneurysm ([place]), BPH (benign prostatic hypertrophy), CAD (coronary artery disease), Carotid artery stenosis, Cataract, CHF (congestive heart failure) ([place]), Chronic systolic heart failure ([place]), Collagen vascular disease ([place]), COPD (chronic obstructive pulmonary disease) ([place]), Coronary artery disease, Diverticulosis, GERD (gastroesophageal reflux disease), Heart murmur, History of colonic polyps, Hyperlipidemia, Hyperplastic colon polyp, Hypertension, Ischemic cardiomyopathy, and RLS (restless legs syndrome).   Significant Hospital Events: Including procedures, antibiotic start and stop dates in addition to other pertinent events   11/17 admitted for shock on pressors  Interim History / Subjective:    Objective   Blood pressure (!) 106/52, pulse 93, temperature 99.2 F (37.3 C),  temperature source Oral, resp. rate 19, height '5\' 6"'$  (1.676 m), weight 49.9 kg, SpO2 99 %.       No intake or output data in the 24 hours ending 01/29/22 2352 Filed Weights   01/29/22 2016  Weight: 49.9 kg    Examination: General: Thin elderly male in no acute distress HENT: Normocephalic, atraumatic, PERRL, no JVD Lungs: Clear bilateral breath sounds Cardiovascular: Regular rate and rhythm Abdomen: Soft, mild generalized tenderness, nondistended.  Hypoactive bowel sounds. Extremities: No acute deformity or range of motion limitation Neuro: Alert, oriented, nonfocal  Resolved Hospital Problem list     Assessment & Plan:   Shock: Etiology uncertain.  Patient complaining of abdominal pain but no clear focus for sepsis including CT abdomen and pelvis.  I suspect he is somewhat hypovolemic.  He does not appear to be volume overloaded either so I suspect this is not cardiogenic shock despite history. Lactic acid 2.5 > 2.2 -Admit to ICU -Norepinephrine infusion for MAP goal 65 -Empiric antibiotics -Echocardiogram -s/p 2L IVF in ED. Continue LR at 156m/Hr  Possible small bowel obstruction: described as a possible early obstruction on CT. Patient has had 3 bowel movements in the ED. No diarrhea. One episode appeared dark/tarry but hemoccult negative.  - Surgery consult pending.  - NPO  Chronic HFrEF (LVEF 40-45% on last echo) CAD - repeat echo - Holding home entresto, farxiga, asa, vytorin, metoprolol, spironolactone while NPO - telemetry monitoring  Best Practice (right click and "Reselect all SmartList Selections" daily)   Diet/type: {diet type:25684} DVT prophylaxis: {anticoagulation (Optional):25687} GI prophylaxis: {{BB:04888}Lines: {Central Venous Access:25771} Foley:  {Central Venous Access:25691} Code Status:  {Code Status:26939} Last date of multidisciplinary goals of care discussion [***]  Labs   CBC: Recent Labs  Lab 01/29/22 2011  WBC 7.9  NEUTROABS 7.5   HGB 9.0*  HCT 27.0*  MCV 89.4  PLT 315    Basic Metabolic Panel: Recent Labs  Lab 01/29/22 2011  NA 135  K 3.6  CL 105  CO2 19*  GLUCOSE 121*  BUN 26*  CREATININE 1.30*  CALCIUM 8.3*   GFR: Estimated Creatinine Clearance: 32 mL/min (A) (by C-G formula based on SCr of 1.3 mg/dL (H)). Recent Labs  Lab 01/29/22 2011 01/29/22 2217  WBC 7.9  --   LATICACIDVEN 2.5* 2.2*    Liver Function Tests: Recent Labs  Lab 01/29/22 2011  AST 16  ALT 9  ALKPHOS 52  BILITOT 0.5  PROT 6.3*  ALBUMIN 2.6*   Recent Labs  Lab 01/29/22 2011  LIPASE 29   No results for input(s): "AMMONIA" in the last 168 hours.  ABG    Component Value Date/Time   HCO3 23.1 09/10/2013 0220   TCO2 24 09/10/2013 0220   ACIDBASEDEF 1.0 09/10/2013 0220   O2SAT 78.0 09/10/2013 0220     Coagulation Profile: Recent Labs  Lab 01/29/22 2011  INR 1.4*    Cardiac Enzymes: No results for input(s): "CKTOTAL", "CKMB", "CKMBINDEX", "TROPONINI" in the last 168 hours.  HbA1C: Hgb A1c MFr Bld  Date/Time Value Ref Range Status  07/07/2021 09:51 AM 6.0 4.6 - 6.5 % Final    Comment:    Glycemic Control Guidelines for People with Diabetes:Non Diabetic:  <6%Goal of Therapy: <7%Additional Action Suggested:  >8%   07/03/2020 09:06 AM 5.6 4.6 - 6.5 % Final    Comment:    Glycemic Control Guidelines for People with Diabetes:Non Diabetic:  <6%Goal of Therapy: <7%Additional Action Suggested:  >8%     CBG: Recent Labs  Lab 01/29/22 2005  GLUCAP 128*    Review of Systems:   ***  Past Medical History:  He,  has a past medical history of Adenomatous colon polyp, Aortic aneurysm ([place]), BPH (benign prostatic hypertrophy), CAD (coronary artery disease), Carotid artery stenosis, Cataract, CHF (congestive heart failure) ([facility]), Chronic systolic heart failure ([place]), Collagen vascular disease ([place]), COPD (chronic obstructive pulmonary disease) ([place]), Coronary artery disease, Diverticulosis, GERD  (gastroesophageal reflux disease), Heart murmur, History of colonic polyps, Hyperlipidemia, Hyperplastic colon polyp, Hypertension, Ischemic cardiomyopathy, and RLS (restless legs syndrome).   Surgical History:   Past Surgical History:  Procedure Laterality Date  . cardiac stents  09-2013  . CAROTID ENDARTERECTOMY  04/17/2008   right  . CAROTID ENDARTERECTOMY  05/30/08   Left  . CATARACT EXTRACTION W/PHACO Right 10/05/2018   Procedure: CATARACT EXTRACTION PHACO AND INTRAOCULAR LENS PLACEMENT ([place]), RIGHT;  Surgeon: Birder Robson, MD;  Location: [facility] ORS;  Service: Ophthalmology;  Laterality: Right;  [place] 01:06.4 cde 12.79 Fluid Pack Lot # [id number]  . CATARACT EXTRACTION W/PHACO Left 11/02/2018   Procedure: CATARACT EXTRACTION PHACO AND INTRAOCULAR LENS PLACEMENT ([place]), LEFT;  Surgeon: Birder Robson, MD;  Location: [facility] ORS;  Service: Ophthalmology;  Laterality: Left;  [place]  01:10 CDE 11.52 Fluid pack lot # [id number]  . CHOLECYSTECTOMY     Gall Bladder  . CORONARY ANGIOGRAM  09/11/2013   Procedure: CORONARY ANGIOGRAM;  Surgeon: Troy Sine, MD;  Location: [facility] C[facility] LAB;  Service: Cardiovascular;;  . CORONARY ANGIOPLASTY     STENTS  . HERNIA REPAIR    . lower aorta aneurysm  05/26/2015   [facility]  . PERCUTANEOUS STENT INTERVENTION  09/11/2013   Procedure: PERCUTANEOUS STENT INTERVENTION;  Surgeon: Troy Sine, MD;  Location: [facility] CATH LAB;  Service: Cardiovascular;;  DES Prox RCA 3.5x15 xience      Social History:   reports that he has been smoking cigarettes. He has a 50.00 pack-year smoking history. He quit smokeless tobacco use about 8 years ago. He reports that he does not drink alcohol and does not use drugs.   Family History:  His family history includes Cancer in his father; Emphysema in his father; Heart attack in his brother; Heart disease in his brother; Hyperlipidemia in his brother, brother, brother, and brother; Hypertension in his mother; Multiple sclerosis in his brother;  Pancreatic cancer in his brother. There is no history of Kidney disease, Prostate cancer, Colon cancer, Esophageal cancer, Rectal cancer, or Stomach cancer.   Allergies Allergies  Allergen Reactions  . Codeine Swelling    throat swells     Home Medications  Prior to Admission medications   Medication Sig Start Date End Date Taking? Authorizing Provider  methocarbamol (ROBAXIN) 500 MG tablet TAKE 1 TABLET (500 MG TOTAL) BY MOUTH EVERY DAY AT BEDTIME AS NEEDED FOR MUSCLE SPASM 12/29/21   Bedsole, Amy E, MD  acetaminophen (TYLENOL) 500 MG tablet Take 500 mg by mouth every 6 (six) hours as needed (for pain.).    [provider]  aspirin EC 81 MG tablet Take 81 mg by mouth every evening.     [provider]  clopidogrel (PLAVIX) 75 MG tablet TAKE 1 TABLET BY MOUTH EVERY DAY 11/30/21   Larey Dresser, MD  cyanocobalamin (VITAMIN B12) 1000 MCG tablet Take 1,000 mcg by mouth daily.    [provider]  dapagliflozin propanediol (FARXIGA) 10 MG TABS tablet Take 1 tablet (10 mg total) by mouth daily before breakfast. 11/27/21   Larey Dresser, MD  ENTRESTO 24-26 MG TAKE 1 TABLET BY MOUTH TWICE A DAY 08/25/21   Larey Dresser, MD  ezetimibe-simvastatin (VYTORIN) 10-40 MG tablet TAKE 1 TABLET BY MOUTH EVERYDAY AT BEDTIME 07/17/21   Bedsole, Amy E, MD  famotidine (PEPCID) 20 MG tablet Take 1 tablet (20 mg total) by mouth at bedtime. 01/30/19   Pyrtle, Lajuan Lines, MD  ferrous sulfate 325 (65 FE) MG tablet Take 325 mg by mouth daily with breakfast.    [provider]  metoprolol succinate (TOPROL-XL) 50 MG 24 hr tablet TAKE 1 TABLET BY MOUTH EVERY DAY WITH OR IMMEDIATELY FOLLOWING MEAL (NEEDS OFFICE VISIT) 12/29/21   Larey Dresser, MD  nitroGLYCERIN (NITROSTAT) 0.4 MG SL tablet Place 1 tablet (0.4 mg total) under the tongue every 5 (five) minutes x 3 doses as needed for chest pain. 02/12/16   Bedsole, Amy E, MD  Omega-3 Fatty Acids (FISH OIL) 1000 MG CAPS Take 1 capsule by  mouth daily.    [provider]  rOPINIRole (REQUIP) 3 MG tablet TAKE 1/2 TABLET BY MOUTH IN THE MORNING & TAKE 1 TABLET AT NIGHT 09/23/21   Bedsole, Amy E, MD  spironolactone (ALDACTONE) 25 MG tablet TAKE 1/2 TABLET BY MOUTH EVERY DAY 11/18/21   Larey Dresser, MD  tamsulosin (FLOMAX) 0.4 MG CAPS capsule TAKE 1 CAPSULE BY MOUTH EVERY DAY 08/24/21   [name], MD     Critical care time: ***

## 2022-01-29 NOTE — ED Triage Notes (Signed)
Bib [facility] from home running high fever wife called said having trouble breathing. Patient contracted upon ems arrival but ok now. Fever 103 temporal ems gave 800 cc ns 20 g in lfa '650mg'$  tylenol. AMS with ems arrival now slightly confused.  End tidal 26, 02 94 RA, cbg 153 hr 120  st 106/70. Pt now A&O 4

## 2022-01-29 NOTE — ED Notes (Signed)
Notified md Maylon Peppers of low bp more fluids to be ordered

## 2022-01-29 NOTE — ED Provider Notes (Signed)
[facility] EMERGENCY DEPARTMENT Provider Note   CSN: [id number] Arrival date & time: 01/29/22  1953     History  Chief Complaint  Patient presents with   Fever   Shortness of Breath    Erik [name]. is a 81 y.o. male.  Patient is an 81 year old male with a past medical history of CHF with reduced EF, CAD status post stent placement, hypertension, COPD presenting to the emergency department with fever and cough.  The patient states that he has been feeling unwell and has been staying in bed all day with a decreased appetite.  He states that he has had a recent productive cough with some associated shortness of breath.  He states he did not note that he was having a fever at home.  He denies any chest pain.  He states he is having mild diffuse abdominal pain but denies any nausea, vomiting or diarrhea, dysuria or hematuria.  He denies any known sick contacts.  Did have some slight confusion for EMS but was at his baseline upon arrival.  He was given fluids and Tylenol by medics.  The history is provided by the patient, the spouse and the EMS personnel.  Fever Shortness of Breath Associated symptoms: fever        Home Medications Prior to Admission medications   Medication Sig Start Date End Date Taking? Authorizing Provider  methocarbamol (ROBAXIN) 500 MG tablet TAKE 1 TABLET (500 MG TOTAL) BY MOUTH EVERY DAY AT BEDTIME AS NEEDED FOR MUSCLE SPASM 12/29/21   Bedsole, Amy E, MD  acetaminophen (TYLENOL) 500 MG tablet Take 500 mg by mouth every 6 (six) hours as needed (for pain.).    [provider]  aspirin EC 81 MG tablet Take 81 mg by mouth every evening.     [provider]  clopidogrel (PLAVIX) 75 MG tablet TAKE 1 TABLET BY MOUTH EVERY DAY 11/30/21   Larey Dresser, MD  cyanocobalamin (VITAMIN B12) 1000 MCG tablet Take 1,000 mcg by mouth daily.    [provider]  dapagliflozin propanediol (FARXIGA) 10 MG TABS tablet Take 1  tablet (10 mg total) by mouth daily before breakfast. 11/27/21   Larey Dresser, MD  ENTRESTO 24-26 MG TAKE 1 TABLET BY MOUTH TWICE A DAY 08/25/21   Larey Dresser, MD  ezetimibe-simvastatin (VYTORIN) 10-40 MG tablet TAKE 1 TABLET BY MOUTH EVERYDAY AT BEDTIME 07/17/21   Bedsole, Amy E, MD  famotidine (PEPCID) 20 MG tablet Take 1 tablet (20 mg total) by mouth at bedtime. 01/30/19   Pyrtle, Lajuan Lines, MD  ferrous sulfate 325 (65 FE) MG tablet Take 325 mg by mouth daily with breakfast.    [provider]  metoprolol succinate (TOPROL-XL) 50 MG 24 hr tablet TAKE 1 TABLET BY MOUTH EVERY DAY WITH OR IMMEDIATELY FOLLOWING MEAL (NEEDS OFFICE VISIT) 12/29/21   Larey Dresser, MD  nitroGLYCERIN (NITROSTAT) 0.4 MG SL tablet Place 1 tablet (0.4 mg total) under the tongue every 5 (five) minutes x 3 doses as needed for chest pain. 02/12/16   Bedsole, Amy E, MD  Omega-3 Fatty Acids (FISH OIL) 1000 MG CAPS Take 1 capsule by mouth daily.    [provider]  rOPINIRole (REQUIP) 3 MG tablet TAKE 1/2 TABLET BY MOUTH IN THE MORNING & TAKE 1 TABLET AT NIGHT 09/23/21   Bedsole, Amy E, MD  spironolactone (ALDACTONE) 25 MG tablet TAKE 1/2 TABLET BY MOUTH EVERY DAY 11/18/21   Larey Dresser, MD  tamsulosin (FLOMAX) 0.4 MG CAPS capsule TAKE 1 CAPSULE BY MOUTH EVERY DAY 08/24/21   [name], MD      Allergies    Codeine    Review of Systems   Review of Systems  Constitutional:  Positive for fever.  Respiratory:  Positive for shortness of breath.     Physical Exam Updated Vital Signs BP (!) 103/52   Pulse 92   Temp 99.2 F (37.3 C) (Oral)   Resp (!) 23   Ht '5\' 6"'$  (1.676 m)   Wt 49.9 kg   SpO2 95%   BMI 17.75 kg/m  Physical Exam Vitals and nursing note reviewed.  Constitutional:      General: He is not in acute distress.    Appearance: He is well-developed. He is ill-appearing.  HENT:     Head: Normocephalic and atraumatic.     Mouth/Throat:     Mouth: Mucous membranes are moist.      Pharynx: Oropharynx is clear.  Eyes:     Extraocular Movements: Extraocular movements intact.  Cardiovascular:     Rate and Rhythm: Regular rhythm. Tachycardia present.     Pulses: Normal pulses.     Heart sounds: Normal heart sounds.  Pulmonary:     Effort: Pulmonary effort is normal. No accessory muscle usage or respiratory distress.     Breath sounds: Normal breath sounds.  Abdominal:     Palpations: Abdomen is soft.     Tenderness: There is abdominal tenderness (Mild, diffuse, no rebound or guarding).  Musculoskeletal:     Cervical back: Normal range of motion and neck supple.     Right lower leg: No edema.     Left lower leg: No edema.  Skin:    General: Skin is warm and dry.  Neurological:     General: No focal deficit present.     Mental Status: He is alert and oriented to person, place, and time.  Psychiatric:        Mood and Affect: Mood normal.        Behavior: Behavior normal.     ED Results / Procedures / Treatments   Labs (all labs ordered are listed, but only abnormal results are displayed) Labs Reviewed  LACTIC ACID, PLASMA - Abnormal; Notable for the following components:      Result Value   Lactic Acid, Venous 2.5 (*)    All other components within normal limits  LACTIC ACID, PLASMA - Abnormal; Notable for the following components:   Lactic Acid, Venous 2.2 (*)    All other components within normal limits  COMPREHENSIVE METABOLIC PANEL - Abnormal; Notable for the following components:   CO2 19 (*)    Glucose, Bld 121 (*)    BUN 26 (*)    Creatinine, Ser 1.30 (*)    Calcium 8.3 (*)    Total Protein 6.3 (*)    Albumin 2.6 (*)    GFR, Estimated 56 (*)    All other components within normal limits  CBC WITH DIFFERENTIAL/PLATELET - Abnormal; Notable for the following components:   RBC 3.02 (*)    Hemoglobin 9.0 (*)    HCT 27.0 (*)    Lymphs Abs 0.3 (*)    All other components within normal limits  PROTIME-INR - Abnormal; Notable for the following  components:   Prothrombin Time 17.2 (*)    INR 1.4 (*)    All other components within normal limits  URINALYSIS, ROUTINE W REFLEX MICROSCOPIC - Abnormal; Notable for  the following components:   APPearance HAZY (*)    Glucose, UA >=500 (*)    Protein, ur 30 (*)    All other components within normal limits  CBG MONITORING, ED - Abnormal; Notable for the following components:   Glucose-Capillary 128 (*)    All other components within normal limits  RESP PANEL BY RT-PCR (FLU A&B, COVID) ARPGX2  CULTURE, BLOOD (ROUTINE X 2)  CULTURE, BLOOD (ROUTINE X 2)  URINE CULTURE  APTT  LIPASE, BLOOD  POC OCCULT BLOOD, ED    EKG EKG Interpretation  Date/Time:  Friday January 29 2022 20:02:55 EST Ventricular Rate:  116 PR Interval:  150 QRS Duration: 160 QT Interval:  318 QTC Calculation: 442 R Axis:   74 Text Interpretation: Ectopic atrial tachycardia, unifocal Left bundle branch block Baseline wander HEART RATE INCREASED SINCE last EKG Confirmed by Leanord Asal ([id number]) on 01/29/2022 8:07:19 PM  Radiology CT Abdomen Pelvis W Contrast  Result Date: 01/29/2022 CLINICAL DATA:  Sepsis, abdominal pain EXAM: CT ABDOMEN AND PELVIS WITH CONTRAST TECHNIQUE: Multidetector CT imaging of the abdomen and pelvis was performed using the standard protocol following bolus administration of intravenous contrast. RADIATION DOSE REDUCTION: This exam was performed according to the departmental dose-optimization program which includes automated exposure control, adjustment of the mA and/or kV according to patient size and/or use of iterative reconstruction technique. CONTRAST:  50m OMNIPAQUE IOHEXOL 350 MG/ML SOLN COMPARISON:  10/29/2021 FINDINGS: Lower chest: No acute abnormality. Coronary artery and aortic calcifications. Hepatobiliary: No focal hepatic abnormality. Gallbladder unremarkable. Pancreas: No focal abnormality or ductal dilatation. Spleen: No focal abnormality.  Normal size. Adrenals/Urinary Tract:  No adrenal abnormality. No focal renal abnormality. No stones or hydronephrosis. Urinary bladder is unremarkable. Stomach/Bowel: Dilated small bowel loops noted in the abdomen and continuing into the pelvis. Distal small bowel loops are decompressed. Cannot exclude mild distal small bowel obstruction. 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 Maximum aneurysm sac size 4.5 cm compared to 4.6 cm previously. No adenopathy Reproductive: Prostate enlargement. Other: No free fluid or free air. Musculoskeletal: No acute bony abnormality. IMPRESSION: Mildly dilated and fluid-filled small bowel loops continue to the pelvis. Distal small bowel is decompressed. Cannot exclude mild distal small bowel obstruction. Electronically Signed   By: KRolm BaptiseM.D.   On: 01/29/2022 22:41   DG Chest Port 1 View  Result Date: 01/29/2022 CLINICAL DATA:  Fever, shortness of breath EXAM: PORTABLE CHEST 1 VIEW COMPARISON:  CT chest dated 06/16/2021 FINDINGS: Lungs are clear.  No pleural effusion or pneumothorax. The heart is normal in size. Aortic stent at the aortic hiatus. IMPRESSION: No evidence of acute cardiopulmonary disease. Electronically Signed   By: SJulian HyM.D.   On: 01/29/2022 20:48    Procedures .Critical Care  Performed by: KKemper Durie DO Authorized by: KKemper Durie DO   Critical care provider statement:    Critical care time (minutes):  45   Critical care was necessary to treat or prevent imminent or life-threatening deterioration of the following conditions:  Circulatory failure, sepsis and shock   Critical care was time spent personally by me on the following activities:  Development of treatment plan with patient or surrogate, evaluation of patient's response to treatment, discussions with consultants, examination of patient, obtaining history from patient or surrogate, ordering and performing treatments and interventions, ordering and review of  laboratory studies, ordering and review of radiographic studies, pulse oximetry, re-evaluation of patient's condition and review of old charts  I assumed direction of critical care for this patient from another provider in my specialty: no     Care discussed with: admitting provider       Medications Ordered in ED Medications  lactated ringers infusion ( Intravenous New Bag/Given 01/29/22 2259)  ceFEPIme (MAXIPIME) 2 g in sodium chloride 0.9 % 100 mL IVPB (has no administration in time range)  vancomycin (VANCOCIN) IVPB 1000 mg/200 mL premix (has no administration in time range)  0.9 %  sodium chloride infusion (0 mLs Intravenous Hold 01/29/22 2310)  norepinephrine (LEVOPHED) '4mg'$  in 217m (0.016 mg/mL) premix infusion (2 mcg/min Intravenous New Bag/Given 01/29/22 2308)  lactated ringers bolus 1,000 mL (0 mLs Intravenous Stopped 01/29/22 2259)  ceFEPIme (MAXIPIME) 2 g in sodium chloride 0.9 % 100 mL IVPB (0 g Intravenous Stopped 01/29/22 2138)  metroNIDAZOLE (FLAGYL) IVPB 500 mg (0 mg Intravenous Stopped 01/29/22 2249)  vancomycin (VANCOCIN) IVPB 1000 mg/200 mL premix (0 mg Intravenous Stopped 01/29/22 2253)  lactated ringers bolus 500 mL (0 mLs Intravenous Stopped 01/29/22 2309)  pantoprazole (PROTONIX) injection 40 mg (40 mg Intravenous Given 01/29/22 2210)  fentaNYL (SUBLIMAZE) injection 25 mcg (25 mcg Intravenous Given 01/29/22 2209)  iohexol (OMNIPAQUE) 350 MG/ML injection 75 mL (75 mLs Intravenous Contrast Given 01/29/22 2234)  ondansetron (ZOFRAN) injection 4 mg (4 mg Intravenous Given 01/29/22 2255)    ED Course/ Medical Decision Making/ A&P Clinical Course as of 01/29/22 2321  Fri Jan 29, 2022  2133 On reassessment, the patient's labs show new anemia with a hemoglobin of 9 from baseline around 11.  Hemoccult was performed and he did have dark appearing stools.  Patient is continuing to complain of abdominal pain and CT abdomen and pelvis will be performed as we have no obvious  source of infection at this time.  Patient declines any pain medication at this time. [VK]  2302 Patient's CT shows evidence of dilated loops of small bowel concerning for possible early SBO.  Patient reports that he did have a bowel movement since he has been in the emergency department today.  He states he has had a cholecystectomy.  Surgery will be consulted.  The patient has had worsening hypotension despite fluid resuscitation and will be started on levo.  ICU will be consulted for admission. [VK]  2317 I spoke with Dr. SThermon Leylandof general surgery who will evaluate the patient to give further recommendations. Patient signed out to critical care for admission. [VK]    Clinical Course User Index [VK] KKemper Durie DO                           Medical Decision Making This patient presents to the ED with chief complaint(s) of fever and cough with pertinent past medical history of CHF, hypertension, COPD, CAD which further complicates the presenting complaint. The complaint involves an extensive differential diagnosis and also carries with it a high risk of complications and morbidity.    The differential diagnosis includes sepsis, pneumonia, viral syndrome, UTI, intra-abdominal infection, dehydration, electrolyte abnormality  Additional history obtained: Additional history obtained from spouse and EMS  Records reviewed Primary Care Documents  ED Course and Reassessment: Upon patient's arrival to the emergency department he was febrile and ill-appearing though hemodynamically stable.  He will be started on IV fluids, he received Tylenol by medics and will have sepsis work-up performed including abdominal labs.  He will have urine and chest x-ray performed.  He will be started on broad-spectrum  antibiotics and be closely reassessed.  Independent labs interpretation:  The following labs were independently interpreted: Mildly elevated lactate, worsening anemia compared to  baseline  Independent visualization of imaging: - I independently visualized the following imaging with scope of interpretation limited to determining acute life threatening conditions related to emergency care: Chest x-ray, CT AP, which revealed chest x-ray without acute disease, CT AP with dilated loops of small bowel concerning for early SBO  Consultation: - Consulted or discussed management/test interpretation w/ external professional: General surgery, critical care  Consideration for admission or further workup: Patient requires admission for further management of his sepsis in the setting of early SBO Social Determinants of health: N/A    Amount and/or Complexity of Data Reviewed Labs: ordered. Radiology: ordered. ECG/medicine tests: ordered.  Risk Prescription drug management. Decision regarding hospitalization.          Final Clinical Impression(s) / ED Diagnoses Final diagnoses:  Sepsis without acute organ dysfunction, due to unspecified organism ([place])  Anemia, unspecified type  SBO (small bowel obstruction) [facility])    Rx / DC Orders ED Discharge Orders     None         Kemper Durie, DO 01/29/22 2321

## 2022-01-30 ENCOUNTER — Inpatient Hospital Stay (HOSPITAL_COMMUNITY): Payer: Medicare Other

## 2022-01-30 DIAGNOSIS — I509 Heart failure, unspecified: Secondary | ICD-10-CM | POA: Diagnosis not present

## 2022-01-30 DIAGNOSIS — D688 Other specified coagulation defects: Secondary | ICD-10-CM | POA: Diagnosis not present

## 2022-01-30 DIAGNOSIS — E871 Hypo-osmolality and hyponatremia: Secondary | ICD-10-CM | POA: Diagnosis not present

## 2022-01-30 DIAGNOSIS — K7200 Acute and subacute hepatic failure without coma: Secondary | ICD-10-CM | POA: Diagnosis not present

## 2022-01-30 DIAGNOSIS — R6521 Severe sepsis with septic shock: Secondary | ICD-10-CM | POA: Diagnosis not present

## 2022-01-30 DIAGNOSIS — Z681 Body mass index (BMI) 19 or less, adult: Secondary | ICD-10-CM | POA: Diagnosis not present

## 2022-01-30 DIAGNOSIS — I340 Nonrheumatic mitral (valve) insufficiency: Secondary | ICD-10-CM | POA: Diagnosis not present

## 2022-01-30 DIAGNOSIS — I255 Ischemic cardiomyopathy: Secondary | ICD-10-CM | POA: Diagnosis present

## 2022-01-30 DIAGNOSIS — E43 Unspecified severe protein-calorie malnutrition: Secondary | ICD-10-CM | POA: Diagnosis not present

## 2022-01-30 DIAGNOSIS — F17210 Nicotine dependence, cigarettes, uncomplicated: Secondary | ICD-10-CM | POA: Diagnosis present

## 2022-01-30 DIAGNOSIS — R7881 Bacteremia: Secondary | ICD-10-CM | POA: Diagnosis not present

## 2022-01-30 DIAGNOSIS — D649 Anemia, unspecified: Secondary | ICD-10-CM | POA: Diagnosis not present

## 2022-01-30 DIAGNOSIS — I5022 Chronic systolic (congestive) heart failure: Secondary | ICD-10-CM | POA: Diagnosis not present

## 2022-01-30 DIAGNOSIS — I2510 Atherosclerotic heart disease of native coronary artery without angina pectoris: Secondary | ICD-10-CM | POA: Diagnosis present

## 2022-01-30 DIAGNOSIS — A419 Sepsis, unspecified organism: Secondary | ICD-10-CM | POA: Diagnosis not present

## 2022-01-30 DIAGNOSIS — N179 Acute kidney failure, unspecified: Secondary | ICD-10-CM | POA: Diagnosis not present

## 2022-01-30 DIAGNOSIS — I083 Combined rheumatic disorders of mitral, aortic and tricuspid valves: Secondary | ICD-10-CM | POA: Diagnosis not present

## 2022-01-30 DIAGNOSIS — N400 Enlarged prostate without lower urinary tract symptoms: Secondary | ICD-10-CM | POA: Diagnosis present

## 2022-01-30 DIAGNOSIS — Z1152 Encounter for screening for COVID-19: Secondary | ICD-10-CM | POA: Diagnosis not present

## 2022-01-30 DIAGNOSIS — R109 Unspecified abdominal pain: Secondary | ICD-10-CM | POA: Diagnosis not present

## 2022-01-30 DIAGNOSIS — G2581 Restless legs syndrome: Secondary | ICD-10-CM | POA: Diagnosis present

## 2022-01-30 DIAGNOSIS — J449 Chronic obstructive pulmonary disease, unspecified: Secondary | ICD-10-CM | POA: Diagnosis not present

## 2022-01-30 DIAGNOSIS — I342 Nonrheumatic mitral (valve) stenosis: Secondary | ICD-10-CM | POA: Diagnosis not present

## 2022-01-30 DIAGNOSIS — Z95820 Peripheral vascular angioplasty status with implants and grafts: Secondary | ICD-10-CM | POA: Diagnosis not present

## 2022-01-30 DIAGNOSIS — E876 Hypokalemia: Secondary | ICD-10-CM | POA: Diagnosis present

## 2022-01-30 DIAGNOSIS — K3189 Other diseases of stomach and duodenum: Secondary | ICD-10-CM | POA: Diagnosis not present

## 2022-01-30 DIAGNOSIS — R748 Abnormal levels of other serum enzymes: Secondary | ICD-10-CM | POA: Diagnosis not present

## 2022-01-30 DIAGNOSIS — Z9049 Acquired absence of other specified parts of digestive tract: Secondary | ICD-10-CM | POA: Diagnosis not present

## 2022-01-30 DIAGNOSIS — A4101 Sepsis due to Methicillin susceptible Staphylococcus aureus: Secondary | ICD-10-CM | POA: Diagnosis not present

## 2022-01-30 DIAGNOSIS — K760 Fatty (change of) liver, not elsewhere classified: Secondary | ICD-10-CM | POA: Diagnosis not present

## 2022-01-30 DIAGNOSIS — I110 Hypertensive heart disease with heart failure: Secondary | ICD-10-CM | POA: Diagnosis not present

## 2022-01-30 DIAGNOSIS — I361 Nonrheumatic tricuspid (valve) insufficiency: Secondary | ICD-10-CM | POA: Diagnosis not present

## 2022-01-30 DIAGNOSIS — Q2112 Patent foramen ovale: Secondary | ICD-10-CM | POA: Diagnosis not present

## 2022-01-30 DIAGNOSIS — R579 Shock, unspecified: Secondary | ICD-10-CM | POA: Diagnosis present

## 2022-01-30 DIAGNOSIS — E785 Hyperlipidemia, unspecified: Secondary | ICD-10-CM | POA: Diagnosis present

## 2022-01-30 DIAGNOSIS — R54 Age-related physical debility: Secondary | ICD-10-CM | POA: Diagnosis present

## 2022-01-30 DIAGNOSIS — F17200 Nicotine dependence, unspecified, uncomplicated: Secondary | ICD-10-CM | POA: Diagnosis not present

## 2022-01-30 DIAGNOSIS — I081 Rheumatic disorders of both mitral and tricuspid valves: Secondary | ICD-10-CM | POA: Diagnosis not present

## 2022-01-30 DIAGNOSIS — I719 Aortic aneurysm of unspecified site, without rupture: Secondary | ICD-10-CM | POA: Diagnosis present

## 2022-01-30 DIAGNOSIS — I252 Old myocardial infarction: Secondary | ICD-10-CM | POA: Diagnosis not present

## 2022-01-30 DIAGNOSIS — K5669 Other partial intestinal obstruction: Secondary | ICD-10-CM | POA: Diagnosis not present

## 2022-01-30 DIAGNOSIS — B9561 Methicillin susceptible Staphylococcus aureus infection as the cause of diseases classified elsewhere: Secondary | ICD-10-CM | POA: Diagnosis not present

## 2022-01-30 DIAGNOSIS — K56600 Partial intestinal obstruction, unspecified as to cause: Secondary | ICD-10-CM | POA: Diagnosis not present

## 2022-01-30 LAB — BLOOD CULTURE ID PANEL (REFLEXED) - BCID2

## 2022-01-30 LAB — ECHOCARDIOGRAM COMPLETE
AR max vel: 1.83 cm2
AV Area VTI: 1.97 cm2
AV Area mean vel: 1.78 cm2
AV Mean grad: 7 mmHg
AV Peak grad: 14.3 mmHg
Ao pk vel: 1.89 m/s
Area-P 1/2: 4.52 cm2
Calc EF: 50.3 %
Height: 66 in
MV M vel: 5.33 m/s
MV Peak grad: 113.6 mmHg
MV VTI: 1.23 cm2
P 1/2 time: 342 msec
S' Lateral: 3.3 cm
Single Plane A2C EF: 46.4 %
Single Plane A4C EF: 52.4 %
Weight: 1940.05 oz

## 2022-01-30 LAB — BASIC METABOLIC PANEL
Anion gap: 14 (ref 5–15)
BUN: 26 mg/dL — ABNORMAL HIGH (ref 8–23)
CO2: 18 mmol/L — ABNORMAL LOW (ref 22–32)
Calcium: 8.3 mg/dL — ABNORMAL LOW (ref 8.9–10.3)
Chloride: 103 mmol/L (ref 98–111)
Creatinine, Ser: 1.37 mg/dL — ABNORMAL HIGH (ref 0.61–1.24)
GFR, Estimated: 52 mL/min — ABNORMAL LOW (ref >60)
Glucose, Bld: 105 mg/dL — ABNORMAL HIGH (ref 70–99)
Potassium: 4 mmol/L (ref 3.5–5.1)
Sodium: 135 mmol/L (ref 135–145)

## 2022-01-30 LAB — GLUCOSE, CAPILLARY: Glucose-Capillary: 101 mg/dL — ABNORMAL HIGH (ref 70–99)

## 2022-01-30 LAB — CBC
HCT: 26.1 % — ABNORMAL LOW (ref 39.0–52.0)
Hemoglobin: 8.4 g/dL — ABNORMAL LOW (ref 13.0–17.0)
MCH: 29.2 pg (ref 26.0–34.0)
MCHC: 32.2 g/dL (ref 30.0–36.0)
MCV: 90.6 fL (ref 80.0–100.0)
Platelets: 237 10*3/uL (ref 150–400)
RBC: 2.88 MIL/uL — ABNORMAL LOW (ref 4.22–5.81)
RDW: 13.9 % (ref 11.5–15.5)
WBC: 20.1 10*3/uL — ABNORMAL HIGH (ref 4.0–10.5)
nRBC: 0 % (ref 0.0–0.2)

## 2022-01-30 LAB — PHOSPHORUS: Phosphorus: 2.9 mg/dL (ref 2.5–4.6)

## 2022-01-30 LAB — MRSA NEXT GEN BY PCR, NASAL: MRSA by PCR Next Gen: NOT DETECTED

## 2022-01-30 LAB — LACTIC ACID, PLASMA: Lactic Acid, Venous: 2.7 mmol/L (ref 0.5–1.9)

## 2022-01-30 LAB — PROCALCITONIN: Procalcitonin: 46.95 ng/mL

## 2022-01-30 LAB — MAGNESIUM: Magnesium: 1.7 mg/dL (ref 1.7–2.4)

## 2022-01-30 MED ORDER — ORAL CARE MOUTH RINSE: 15.0000 mL | OROMUCOSAL | Status: DC | PRN

## 2022-01-30 MED ORDER — CHLORHEXIDINE GLUCONATE CLOTH 2 % EX PADS
6.0000 | MEDICATED_PAD | Freq: Every day | CUTANEOUS | Status: DC
  Administered 2022-01-30 – 2022-02-03 (×5): 6 via TOPICAL

## 2022-01-30 MED ORDER — SODIUM CHLORIDE 0.9 % IV SOLN
12.5000 mg | Freq: Once | INTRAVENOUS | Status: AC
  Administered 2022-01-30: 12.5 mg via INTRAVENOUS
  Filled 2022-01-30: qty 0.5

## 2022-01-30 MED ORDER — POTASSIUM CHLORIDE 10 MEQ/100ML IV SOLN
10.0000 meq | INTRAVENOUS | Status: AC
  Administered 2022-01-30 (×2): 10 meq via INTRAVENOUS
  Filled 2022-01-30 (×2): qty 100

## 2022-01-30 MED ORDER — HEPARIN SODIUM (PORCINE) 5000 UNIT/ML IJ SOLN
5000.0000 [IU] | Freq: Three times a day (TID) | INTRAMUSCULAR | Status: DC
  Administered 2022-01-30 – 2022-01-31 (×4): 5000 [IU] via SUBCUTANEOUS
  Filled 2022-01-30 (×4): qty 1

## 2022-01-30 MED ORDER — CEFAZOLIN SODIUM-DEXTROSE 2-4 GM/100ML-% IV SOLN
2.0000 g | Freq: Three times a day (TID) | INTRAVENOUS | Status: DC
  Administered 2022-01-30 – 2022-01-31 (×3): 2 g via INTRAVENOUS
  Filled 2022-01-30 (×4): qty 100

## 2022-01-30 MED ORDER — SODIUM CHLORIDE 0.9 % IV SOLN: 25.0000 mg | Freq: Once | INTRAVENOUS | Status: DC

## 2022-01-30 MED ORDER — MORPHINE SULFATE (PF) 2 MG/ML IV SOLN
2.0000 mg | INTRAVENOUS | Status: DC | PRN
  Administered 2022-01-30 – 2022-01-31 (×5): 4 mg via INTRAVENOUS
  Filled 2022-01-30 (×5): qty 2

## 2022-01-30 MED ORDER — HYDROMORPHONE HCL 1 MG/ML IJ SOLN
0.5000 mg | INTRAMUSCULAR | Status: DC | PRN
  Administered 2022-01-30 (×2): 0.5 mg via INTRAVENOUS
  Filled 2022-01-30 (×2): qty 0.5

## 2022-01-30 MED ORDER — PIPERACILLIN-TAZOBACTAM 3.375 G IVPB
3.3750 g | Freq: Three times a day (TID) | INTRAVENOUS | Status: DC
  Administered 2022-01-30: 3.375 g via INTRAVENOUS
  Filled 2022-01-30 (×2): qty 50

## 2022-01-30 MED ORDER — MAGNESIUM SULFATE 2 GM/50ML IV SOLN
2.0000 g | Freq: Once | INTRAVENOUS | Status: AC
  Administered 2022-01-30: 2 g via INTRAVENOUS
  Filled 2022-01-30: qty 50

## 2022-01-30 MED ORDER — VANCOMYCIN HCL 750 MG/150ML IV SOLN: 750.0000 mg | INTRAVENOUS | Status: DC

## 2022-01-30 MED ORDER — ONDANSETRON HCL 4 MG/2ML IJ SOLN
4.0000 mg | Freq: Four times a day (QID) | INTRAMUSCULAR | Status: DC | PRN
  Administered 2022-01-30 – 2022-02-01 (×4): 4 mg via INTRAVENOUS
  Filled 2022-01-30 (×4): qty 2

## 2022-01-30 NOTE — Progress Notes (Signed)
Pharmacy Antibiotic Note  Erik [name]. is a 81 y.o. male admitted on 01/29/2022 with sepsis.  Pharmacy has been consulted for Zosyn dosing. Already on Vancomycin. Scr 1.3.   Plan: Zosyn 3.375G IV q8h to be infused over 4 hours Already on vancomycin   Height: '5\' 6"'$  (167.6 cm) Weight: 49.9 kg (110 lb) IBW/kg (Calculated) : 63.8  Temp (24hrs), Avg:100.1 F (37.8 C), Min:98.2 F (36.8 C), Max:103 F (39.4 C)  Recent Labs  Lab 01/29/22 2011 01/29/22 2217  WBC 7.9  --   CREATININE 1.30*  --   LATICACIDVEN 2.5* 2.2*    Estimated Creatinine Clearance: 32 mL/min (A) (by C-G formula based on SCr of 1.3 mg/dL (H)).    Allergies  Allergen Reactions   Codeine Swelling    throat swells    Narda Bonds, PharmD, BCPS Clinical Pharmacist Phone: [phone]

## 2022-01-30 NOTE — Progress Notes (Signed)
[facility] ADULT ICU REPLACEMENT PROTOCOL   The patient does apply for the [facility] Adult ICU Electrolyte Replacment Protocol based on the criteria listed below:   1.Exclusion criteria: TCTS, ECMO, Dialysis, and Myasthenia Gravis patients 2. Is GFR >/= 30 ml/min? Yes.    Patient's GFR today is 52 3. Is SCr </= 2? Yes.   Patient's SCr is 1.37 mg/dL 4. Did SCr increase >/= 0.5 in 24 hours? No. 5.Pt's weight >40kg  Yes.   6. Abnormal electrolyte(s): Mag 1.7  7. Electrolytes replaced per protocol 8.  Call MD STAT for K+ </= 2.5, Phos </= 1, or Mag </= 1 Physician:  Denita Lung D [facility] 01/30/2022 2:25 AM

## 2022-01-30 NOTE — Progress Notes (Signed)
NAME:  Erik [name]., MRN:  [id number], DOB:  1940-08-28, LOS: 0 ADMISSION DATE:  01/29/2022, CONSULTATION DATE: 11/17 REFERRING MD: Dr. Maylon Peppers, CHIEF COMPLAINT: Shock  History of Present Illness:  81 year old male with past medical history as below, which is significant for ischemic cardiomyopathy, CAD status post PCI, heart failure reduced ejection fraction, COPD, and aortic aneurysm.  He was in his usual state of health until 11/17 when he developed chills, cough, and mild abdominal pain.  Denies productive cough, nausea, vomiting, diarrhea.  No other complaints.  Upon arrival to the emergency department he was noted to be hypotensive refractory to IV fluid resuscitation and was started on norepinephrine infusion.  Work-up included chest x-ray which was clear and CT abdomen and pelvis which was concerning for possible early small bowel obstruction.  Laboratory evaluation was significant for serum creatinine of 1.3, lactic acid 2.5, and hemoglobin 9.  PCCM was requested to admit to ICU for vasopressor management.  Pertinent  Medical History   has a past medical history of Adenomatous colon polyp, Aortic aneurysm ([place]), BPH (benign prostatic hypertrophy), CAD (coronary artery disease), Carotid artery stenosis, Cataract, CHF (congestive heart failure) ([place]), Chronic systolic heart failure ([place]), Collagen vascular disease ([place]), COPD (chronic obstructive pulmonary disease) ([place]), Coronary artery disease, Diverticulosis, GERD (gastroesophageal reflux disease), Heart murmur, History of colonic polyps, Hyperlipidemia, Hyperplastic colon polyp, Hypertension, Ischemic cardiomyopathy, and RLS (restless legs syndrome).   Significant Hospital Events: Including procedures, antibiotic start and stop dates in addition to other pertinent events   11/17 admitted for shock on pressors  Interim History / Subjective:   Patient remains critically ill, shock on vasopressors.3 L nasal cannula, remains on 8  mics of norepinephrine.  Receiving piperacillin/tazobactam.  Seen in consultation by general surgery.  Sepsis with concern for possible partial small bowel obstruction.  General surgery plans to closely monitor repeat abdominal film later today.  Objective   Blood pressure (!) 105/56, pulse 93, temperature 98.6 F (37 C), temperature source Oral, resp. rate 14, height '5\' 6"'$  (1.676 m), weight 55 kg, SpO2 91 %.        Intake/Output Summary (Last 24 hours) at 01/30/2022 1025 Last data filed at 01/30/2022 0600 Gross per 24 hour  Intake 2880.33 ml  Output 50 ml  Net 2830.33 ml   Filed Weights   01/29/22 2016 01/30/22 0600  Weight: 49.9 kg 55 kg    Examination: General: Thin elderly male resting in bed HENT: NCAT, tracking appropriately pupils reactive Lungs: Clear to auscultation bilaterally no wheeze Cardiovascular: Regular rhythm S1-S2 Abdomen: Generalized tenderness in the abdomen, mildly distended, hypoactive bowel sounds Extremities: No significant edema Neuro: Alert oriented following commands  Resolved Hospital Problem list     Assessment & Plan:   Shock: Unclear etiology of source, appears a little bit abnormal for any intra-abdominal process but he does have abdominal pain, have no other clear source of infection.  Appreciate general surgery recommendations. Plan: Remains in the intensive care unit critically ill receiving IV fluids and norepinephrine infusion to maintain mean arterial pressure goal greater than 65. We will continue to wean vasopressors as tolerated Continue empiric antibiotic coverage, remains on Zosyn Echocardiogram pending Holding diuretics Blood cultures pending.  Significantly elevated procalcitonin of 46 UA unremarkable. Makes me wonder if he is bacteremic or has had bacterial translocation intra-abdominal.  At this point still there is no clear source of infection.  Possible small bowel obstruction: described as a possible early obstruction on  CT. Patient has had  3 bowel movements in the ED. No diarrhea. One episode appeared dark/tarry but hemoccult negative.  Plan: Remains n.p.o. Repeat abdominal film this afternoon per general surgery.  Chronic HFrEF (LVEF 40-45% on last echo) ICM CAD Plan: Repeat echo pending Holding home [place], Farxiga, aspirin, Vytorin, metoprolol, spironolactone Remains on telemetry.  AKI in the setting of shock Hypokalemia Lactic acidosis Plan: Continue fluid resuscitation Replace electrolytes to maintain potassium goal greater than 4 magnesium greater than 2 Repeat chemistry panel in a.m.  Anemia: hemoglobin 9, which is somewhat down from 11 in July. Hemoccult negative. No other signs of bleeding.  Plan: We will transfuse for hemoglobin less than 7, no acute sign of bleeding at this time.  Best Practice (right click and "Reselect all SmartList Selections" daily)   Diet/type: NPO DVT prophylaxis: prophylactic heparin  GI prophylaxis: PPI Lines: N/A Foley:  N/A Code Status:  full code Last date of multidisciplinary goals of care discussion [wife updated at bedside.]  Labs   CBC: Recent Labs  Lab 01/29/22 2011 01/30/22 0129  WBC 7.9 20.1*  NEUTROABS 7.5  --   HGB 9.0* 8.4*  HCT 27.0* 26.1*  MCV 89.4 90.6  PLT 249 893    Basic Metabolic Panel: Recent Labs  Lab 01/29/22 2011 01/30/22 0129  NA 135 135  K 3.6 4.0  CL 105 103  CO2 19* 18*  GLUCOSE 121* 105*  BUN 26* 26*  CREATININE 1.30* 1.37*  CALCIUM 8.3* 8.3*  MG  --  1.7  PHOS  --  2.9   GFR: Estimated Creatinine Clearance: 33.5 mL/min (A) (by C-G formula based on SCr of 1.37 mg/dL (H)). Recent Labs  Lab 01/29/22 2011 01/29/22 2217 01/30/22 0129  PROCALCITON  --   --  46.95  WBC 7.9  --  20.1*  LATICACIDVEN 2.5* 2.2*  --     Liver Function Tests: Recent Labs  Lab 01/29/22 2011  AST 16  ALT 9  ALKPHOS 52  BILITOT 0.5  PROT 6.3*  ALBUMIN 2.6*   Recent Labs  Lab 01/29/22 2011  LIPASE 29   No  results for input(s): "AMMONIA" in the last 168 hours.  ABG    Component Value Date/Time   HCO3 23.1 09/10/2013 0220   TCO2 24 09/10/2013 0220   ACIDBASEDEF 1.0 09/10/2013 0220   O2SAT 78.0 09/10/2013 0220     Coagulation Profile: Recent Labs  Lab 01/29/22 2011  INR 1.4*    Cardiac Enzymes: No results for input(s): "CKTOTAL", "CKMB", "CKMBINDEX", "TROPONINI" in the last 168 hours.  HbA1C: Hgb A1c MFr Bld  Date/Time Value Ref Range Status  07/07/2021 09:51 AM 6.0 4.6 - 6.5 % Final    Comment:    Glycemic Control Guidelines for People with Diabetes:Non Diabetic:  <6%Goal of Therapy: <7%Additional Action Suggested:  >8%   07/03/2020 09:06 AM 5.6 4.6 - 6.5 % Final    Comment:    Glycemic Control Guidelines for People with Diabetes:Non Diabetic:  <6%Goal of Therapy: <7%Additional Action Suggested:  >8%     CBG: Recent Labs  Lab 01/29/22 2005 01/30/22 0336  GLUCAP 128* 101*    This patient is critically ill with multiple organ system failure; which, requires frequent high complexity decision making, assessment, support, evaluation, and titration of therapies. This was completed through the application of advanced monitoring technologies and extensive interpretation of multiple databases. During this encounter critical care time was devoted to patient care services described in this note for 33 minutes.  Garner Nash, DO [facility]  Pulmonary Critical Care 01/30/2022 10:25 AM

## 2022-01-30 NOTE — Progress Notes (Signed)
Pharmacy Antibiotic Note  Erik [name]. is a 81 y.o. male admitted on 01/29/2022 with sepsis.  Pharmacy has been consulted for Vancomycin and Zosyn dosing.  Patient in AKI with Scr 1.37 and CrCl 30-40 mL/min. Baseline Scr appears to be 0.8-1  Plan: Vancomycin '750mg'$  Q24H (eAUC 517) - Goal AUC 400-550 Zosyn 3.375g Q8H extended infusion  F/u cultures for de-escalation Watch renal function for dose adjustments  Height: '5\' 6"'$  (167.6 cm) Weight: 55 kg (121 lb 4.1 oz) IBW/kg (Calculated) : 63.8  Temp (24hrs), Avg:99.4 F (37.4 C), Min:98.1 F (36.7 C), Max:103 F (39.4 C)  Recent Labs  Lab 01/29/22 2011 01/29/22 2217 01/30/22 0129  WBC 7.9  --  20.1*  CREATININE 1.30*  --  1.37*  LATICACIDVEN 2.5* 2.2*  --      Estimated Creatinine Clearance: 33.5 mL/min (A) (by C-G formula based on SCr of 1.37 mg/dL (H)).    Allergies  Allergen Reactions   Codeine Swelling    throat swells    Erskine Speed, PharmD Clinical Pharmacist

## 2022-01-30 NOTE — Progress Notes (Signed)
  Echocardiogram 2D Echocardiogram has been performed.  Erik [name] 01/30/2022, 2:03 PM

## 2022-01-30 NOTE — Progress Notes (Signed)
Subjective: Patient somewhat HOH.  Denies abdominal pain this morning but states he has some pain in his bilateral flanks, but that he has this a lot of mornings as he goes out to his workshop to mess around.  He denies any N/V.  No further BM since last night in the ED.  No blood in stool   Objective: Vital signs in last 24 hours: Temp:  [98.1 F (36.7 C)-103 F (39.4 C)] 98.3 F (36.8 C) (11/18 0338) Pulse Rate:  [84-115] 86 (11/18 0630) Resp:  [12-31] 12 (11/18 0630) BP: (77-125)/(34-70) 104/57 (11/18 0630) SpO2:  [92 %-99 %] 98 % (11/18 0630) Weight:  [49.9 kg-55 kg] 55 kg (11/18 0600) Last BM Date :  (PTA)  Intake/Output from previous day: 11/17 0701 - 11/18 0700 In: 2880.3 [I.V.:900.6; IV Piggyback:1979.8] Out: 50 [Urine:50] Intake/Output this shift: No intake/output data recorded.  PE: Gen: NAD Heart: regular, + murmur Lungs: CTAB Abd: soft, seems nontender but also seems to guard a little bit but denies that he hurts to palpation.  ND, few BS  Lab Results:  Recent Labs    01/29/22 2011 01/30/22 0129  WBC 7.9 20.1*  HGB 9.0* 8.4*  HCT 27.0* 26.1*  PLT 249 237   BMET Recent Labs    01/29/22 2011 01/30/22 0129  NA 135 135  K 3.6 4.0  CL 105 103  CO2 19* 18*  GLUCOSE 121* 105*  BUN 26* 26*  CREATININE 1.30* 1.37*  CALCIUM 8.3* 8.3*   PT/INR Recent Labs    01/29/22 2011  LABPROT 17.2*  INR 1.4*   CMP     Component Value Date/Time   NA 135 01/30/2022 0129   NA 138 08/18/2018 1135   K 4.0 01/30/2022 0129   CL 103 01/30/2022 0129   CO2 18 (L) 01/30/2022 0129   GLUCOSE 105 (H) 01/30/2022 0129   BUN 26 (H) 01/30/2022 0129   BUN 19 08/18/2018 1135   CREATININE 1.37 (H) 01/30/2022 0129   CREATININE 1.11 10/09/2021 1646   CALCIUM 8.3 (L) 01/30/2022 0129   PROT 6.3 (L) 01/29/2022 2011   ALBUMIN 2.6 (L) 01/29/2022 2011   AST 16 01/29/2022 2011   ALT 9 01/29/2022 2011   ALKPHOS 52 01/29/2022 2011   BILITOT 0.5 01/29/2022 2011    GFRNONAA 52 (L) 01/30/2022 0129   GFRAA 95 08/18/2018 1135   Lipase     Component Value Date/Time   LIPASE 29 01/29/2022 2011       Studies/Results: CT Abdomen Pelvis W Contrast  Result Date: 01/29/2022 CLINICAL DATA:  Sepsis, abdominal pain EXAM: CT ABDOMEN AND PELVIS WITH CONTRAST TECHNIQUE: Multidetector CT imaging of the abdomen and pelvis was performed using the standard protocol following bolus administration of intravenous contrast. RADIATION DOSE REDUCTION: This exam was performed according to the departmental dose-optimization program which includes automated exposure control, adjustment of the mA and/or kV according to patient size and/or use of iterative reconstruction technique. CONTRAST:  39m OMNIPAQUE IOHEXOL 350 MG/ML SOLN COMPARISON:  10/29/2021 FINDINGS: Lower chest: No acute abnormality. Coronary artery and aortic calcifications. Hepatobiliary: No focal hepatic abnormality. Gallbladder unremarkable. Pancreas: No focal abnormality or ductal dilatation. Spleen: No focal abnormality.  Normal size. Adrenals/Urinary Tract: No adrenal abnormality. No focal renal abnormality. No stones or hydronephrosis. Urinary bladder is unremarkable. Stomach/Bowel: Dilated small bowel loops noted in the abdomen and continuing into the pelvis. Distal small bowel loops are decompressed. Cannot exclude mild distal small bowel obstruction. Vascular/Lymphatic: Prior stent  graft repair of abdominal aortic aneurysm. Maximum aneurysm sac size 4.5 cm compared to 4.6 cm previously. No adenopathy Reproductive: Prostate enlargement. Other: No free fluid or free air. Musculoskeletal: No acute bony abnormality. IMPRESSION: Mildly dilated and fluid-filled small bowel loops continue to the pelvis. Distal small bowel is decompressed. Cannot exclude mild distal small bowel obstruction. Electronically Signed   By: Rolm Baptise M.D.   On: 01/29/2022 22:41   DG Chest Port 1 View  Result Date: 01/29/2022 CLINICAL DATA:   Fever, shortness of breath EXAM: PORTABLE CHEST 1 VIEW COMPARISON:  CT chest dated 06/16/2021 FINDINGS: Lungs are clear.  No pleural effusion or pneumothorax. The heart is normal in size. Aortic stent at the aortic hiatus. IMPRESSION: No evidence of acute cardiopulmonary disease. Electronically Signed   By: Julian Hy M.D.   On: 01/29/2022 20:48    Anti-infectives: Anti-infectives (From admission, onward)    Start     Dose/Rate Route Frequency Ordered Stop   01/31/22 2100  vancomycin (VANCOCIN) IVPB 1000 mg/200 mL premix  Status:  Discontinued        1,000 mg 200 mL/hr over 60 Minutes Intravenous Every 48 hours 01/29/22 2214 01/30/22 0729   01/30/22 2200  vancomycin (VANCOREADY) IVPB 750 mg/150 mL        750 mg 150 mL/hr over 60 Minutes Intravenous Every 24 hours 01/30/22 0729     01/30/22 0900  ceFEPIme (MAXIPIME) 2 g in sodium chloride 0.9 % 100 mL IVPB  Status:  Discontinued        2 g 200 mL/hr over 30 Minutes Intravenous Every 12 hours 01/29/22 2214 01/30/22 0014   01/30/22 0600  piperacillin-tazobactam (ZOSYN) IVPB 3.375 g        3.375 g 12.5 mL/hr over 240 Minutes Intravenous Every 8 hours 01/30/22 0102     01/29/22 2030  ceFEPIme (MAXIPIME) 2 g in sodium chloride 0.9 % 100 mL IVPB        2 g 200 mL/hr over 30 Minutes Intravenous  Once 01/29/22 2016 01/29/22 2138   01/29/22 2030  metroNIDAZOLE (FLAGYL) IVPB 500 mg        500 mg 100 mL/hr over 60 Minutes Intravenous  Once 01/29/22 2016 01/29/22 2249   01/29/22 2030  vancomycin (VANCOCIN) IVPB 1000 mg/200 mL premix        1,000 mg 200 mL/hr over 60 Minutes Intravenous  Once 01/29/22 2016 01/29/22 2253        Assessment/Plan Sepsis, unclear etiology, Possible pSBO -CT concerning for possible pSBO last night.  Had a BM in the ED though and denies any abdominal pain, N/V this morning. -repeat plain film today to follow up on bowel dilatation -temp up to 103 last night.  Generally this is abnormal for an intra-abdominal  process.  No evidence of ischemia on CT last night -WBC up to 20K today from normal.  Initial blood cx negative so far and UA was unremarkable but urine cx in process. -unclear etiology of sepsis.  On 39mg of levo -continue to closely follow and monitor.  Repeat labs in am and follow up on abdominal film later today  FEN - NPO/IVFs VTE - heparin ID - zosyn, vanc  Cough CAD CHF COPD GERD HLD HTN Ischemic cardiomyopathy  I reviewed hospitalist notes, last 24 h vitals and pain scores, last 48 h intake and output, last 24 h labs and trends, and last 24 h imaging results.   LOS: 0 days    KHenreitta Cea, PA-C  [facility] 01/30/2022, 8:04 AM Please see Amion for pager number during day hours 7:00am-4:30pm or 7:00am -11:30am on weekends

## 2022-01-30 NOTE — Progress Notes (Signed)
[place] Progress Note Patient Name: Erik [name]. DOB: 07-11-1940 MRN: [id number]   Date of Service  01/30/2022  HPI/Events of Note  81/M with ischemic cardiomyopathy, CHFpEF, presenting with chills, cough and abdominal pain. He was hypotensive, refractory to IVFs and started on levophed. CT abdomen concerning for small bowel obstruction.   BP 96/56, HR 92, RR 19, O2 sats 98% on RA. Pt is awake and alert, not in distress.    eICU Interventions  Continue empiric antibiotics. Follow up cultures.  Levophed to maintain MAP >65.  Dilaudid IV ordered PRN.  Keep NPO.  Continue IVFs.  Repeat echo.  Heparin for DVT prophylaxis.      Intervention Category Evaluation Type: New Patient Evaluation  Elsie Lincoln 01/30/2022, 1:22 AM

## 2022-01-30 NOTE — Progress Notes (Signed)
[place] Progress Note Patient Name: Erik [name]. DOB: 1940-05-19 MRN: [id number]   Date of Service  01/30/2022  HPI/Events of Note  Pt reports no improvement in abdominal pain after dilaudid.  No allergic reaction noted after dilaudid.   Pt also nauseated and dry heaving.   Qtc 442  eICU Interventions  Zofran ordered.  Morphine IV PRN ordered.      Intervention Category Intermediate Interventions: Other:  Elsie Lincoln 01/30/2022, 3:26 AM

## 2022-01-30 NOTE — Progress Notes (Signed)
[place] Progress Note Patient Name: Toni Hoffmeister. DOB: 01/04/1941 MRN: [id number]   Date of Service  01/30/2022  HPI/Events of Note  LR order had expired.  Pt has been weaned off pressors. EF 50-55%  eICU Interventions  Will hold off resuming fluids for now.  Will continue to monitor hemodynamics closely, trend lactate.         Alicyn Klann M DELA CRUZ 01/30/2022, 8:07 PM

## 2022-01-30 NOTE — Progress Notes (Signed)
PHARMACY - PHYSICIAN COMMUNICATION CRITICAL VALUE ALERT - BLOOD CULTURE IDENTIFICATION (BCID)  Erik [name]. is an 81 y.o. male who presented to [facility] on 01/29/2022 with a chief complaint of Sepsis  Assessment:  4 of 4 blood cultures positive for Staph aureus without resistance (MSSA)  Name of physician (or Provider) Contacted: Dr. Valeta Harms  Current antibiotics: Vancomycin/Zosyn  Changes to prescribed antibiotics recommended:  Discontinue vancomycin and Zosyn Start cefazolin 2g Q8H ID consult  Results for orders placed or performed during the hospital encounter of 01/29/22  Blood Culture ID Panel (Reflexed) (Collected: 01/29/2022  8:11 PM)  Result Value Ref Range   Enterococcus faecalis NOT DETECTED NOT DETECTED   Enterococcus Faecium NOT DETECTED NOT DETECTED   Listeria monocytogenes NOT DETECTED NOT DETECTED   Staphylococcus species DETECTED (A) NOT DETECTED   Staphylococcus aureus (BCID) DETECTED (A) NOT DETECTED   Staphylococcus epidermidis NOT DETECTED NOT DETECTED   Staphylococcus lugdunensis NOT DETECTED NOT DETECTED   Streptococcus species NOT DETECTED NOT DETECTED   Streptococcus agalactiae NOT DETECTED NOT DETECTED   Streptococcus pneumoniae NOT DETECTED NOT DETECTED   Streptococcus pyogenes NOT DETECTED NOT DETECTED   A.calcoaceticus-baumannii NOT DETECTED NOT DETECTED   Bacteroides fragilis NOT DETECTED NOT DETECTED   Enterobacterales NOT DETECTED NOT DETECTED   Enterobacter cloacae complex NOT DETECTED NOT DETECTED   Escherichia coli NOT DETECTED NOT DETECTED   Klebsiella aerogenes NOT DETECTED NOT DETECTED   Klebsiella oxytoca NOT DETECTED NOT DETECTED   Klebsiella pneumoniae NOT DETECTED NOT DETECTED   Proteus species NOT DETECTED NOT DETECTED   Salmonella species NOT DETECTED NOT DETECTED   Serratia marcescens NOT DETECTED NOT DETECTED   Haemophilus influenzae NOT DETECTED NOT DETECTED   Neisseria meningitidis NOT DETECTED NOT DETECTED    Pseudomonas aeruginosa NOT DETECTED NOT DETECTED   Stenotrophomonas maltophilia NOT DETECTED NOT DETECTED   Candida albicans NOT DETECTED NOT DETECTED   Candida auris NOT DETECTED NOT DETECTED   Candida glabrata NOT DETECTED NOT DETECTED   Candida krusei NOT DETECTED NOT DETECTED   Candida parapsilosis NOT DETECTED NOT DETECTED   Candida tropicalis NOT DETECTED NOT DETECTED   Cryptococcus neoformans/gattii NOT DETECTED NOT DETECTED   Meth resistant mecA/C and MREJ NOT DETECTED NOT DETECTED    [name] 01/30/2022  12:01 PM

## 2022-01-31 DIAGNOSIS — A419 Sepsis, unspecified organism: Secondary | ICD-10-CM | POA: Diagnosis not present

## 2022-01-31 DIAGNOSIS — R6521 Severe sepsis with septic shock: Secondary | ICD-10-CM | POA: Diagnosis not present

## 2022-01-31 DIAGNOSIS — R7881 Bacteremia: Secondary | ICD-10-CM | POA: Diagnosis not present

## 2022-01-31 DIAGNOSIS — B9561 Methicillin susceptible Staphylococcus aureus infection as the cause of diseases classified elsewhere: Secondary | ICD-10-CM

## 2022-01-31 DIAGNOSIS — R579 Shock, unspecified: Secondary | ICD-10-CM | POA: Diagnosis not present

## 2022-01-31 LAB — CBC
HCT: 24.3 % — ABNORMAL LOW (ref 39.0–52.0)
Hemoglobin: 7.7 g/dL — ABNORMAL LOW (ref 13.0–17.0)
MCH: 29.1 pg (ref 26.0–34.0)
MCHC: 31.7 g/dL (ref 30.0–36.0)
MCV: 91.7 fL (ref 80.0–100.0)
Platelets: 188 10*3/uL (ref 150–400)
RBC: 2.65 MIL/uL — ABNORMAL LOW (ref 4.22–5.81)
RDW: 13.9 % (ref 11.5–15.5)
WBC: 14.2 10*3/uL — ABNORMAL HIGH (ref 4.0–10.5)
nRBC: 0 % (ref 0.0–0.2)

## 2022-01-31 LAB — RESPIRATORY PANEL BY PCR

## 2022-01-31 LAB — GLUCOSE, CAPILLARY: Glucose-Capillary: 103 mg/dL — ABNORMAL HIGH (ref 70–99)

## 2022-01-31 LAB — BASIC METABOLIC PANEL
Anion gap: 15 (ref 5–15)
BUN: 44 mg/dL — ABNORMAL HIGH (ref 8–23)
CO2: 18 mmol/L — ABNORMAL LOW (ref 22–32)
Calcium: 7.7 mg/dL — ABNORMAL LOW (ref 8.9–10.3)
Chloride: 100 mmol/L (ref 98–111)
Creatinine, Ser: 2.01 mg/dL — ABNORMAL HIGH (ref 0.61–1.24)
GFR, Estimated: 33 mL/min — ABNORMAL LOW (ref >60)
Glucose, Bld: 92 mg/dL (ref 70–99)
Potassium: 4.9 mmol/L (ref 3.5–5.1)
Sodium: 133 mmol/L — ABNORMAL LOW (ref 135–145)

## 2022-01-31 LAB — DIC (DISSEMINATED INTRAVASCULAR COAGULATION)PANEL
D-Dimer, Quant: >20 ug/mL-FEU — ABNORMAL HIGH (ref 0.00–0.50)
Fibrinogen: 558 mg/dL — ABNORMAL HIGH (ref 210–475)
INR: 2.2 — ABNORMAL HIGH (ref 0.8–1.2)
Platelets: 167 10*3/uL (ref 150–400)
Prothrombin Time: 24.2 seconds — ABNORMAL HIGH (ref 11.4–15.2)
Smear Review: NONE SEEN
aPTT: 48 seconds — ABNORMAL HIGH (ref 24–36)

## 2022-01-31 LAB — MAGNESIUM: Magnesium: 2.3 mg/dL (ref 1.7–2.4)

## 2022-01-31 LAB — LACTIC ACID, PLASMA: Lactic Acid, Venous: 2.7 mmol/L (ref 0.5–1.9)

## 2022-01-31 LAB — PHOSPHORUS: Phosphorus: 4.5 mg/dL (ref 2.5–4.6)

## 2022-01-31 MED ORDER — ROPINIROLE HCL 1 MG PO TABS
1.5000 mg | ORAL_TABLET | Freq: Every day | ORAL | Status: DC
  Administered 2022-01-31 – 2022-02-03 (×4): 1.5 mg via ORAL
  Filled 2022-01-31 (×2): qty 1
  Filled 2022-01-31 (×2): qty 2

## 2022-01-31 MED ORDER — CEFAZOLIN SODIUM-DEXTROSE 2-4 GM/100ML-% IV SOLN
2.0000 g | Freq: Two times a day (BID) | INTRAVENOUS | Status: DC
  Administered 2022-01-31 – 2022-02-02 (×4): 2 g via INTRAVENOUS
  Filled 2022-01-31 (×5): qty 100

## 2022-01-31 MED ORDER — ROPINIROLE HCL 1 MG PO TABS
3.0000 mg | ORAL_TABLET | Freq: Every day | ORAL | Status: DC
  Administered 2022-01-31 – 2022-02-02 (×2): 3 mg via ORAL
  Filled 2022-01-31 (×3): qty 3

## 2022-01-31 MED ORDER — ASPIRIN 81 MG PO CHEW: 81.0000 mg | CHEWABLE_TABLET | Freq: Every day | ORAL | Status: DC

## 2022-01-31 MED ORDER — SODIUM CHLORIDE 0.9 % IV BOLUS
500.0000 mL | Freq: Once | INTRAVENOUS | Status: AC
  Administered 2022-01-31: 500 mL via INTRAVENOUS

## 2022-01-31 MED ORDER — ATORVASTATIN CALCIUM 10 MG PO TABS: 20.0000 mg | ORAL_TABLET | Freq: Every day | ORAL | Status: DC

## 2022-01-31 MED ORDER — ASPIRIN 81 MG PO CHEW
81.0000 mg | CHEWABLE_TABLET | Freq: Every day | ORAL | Status: DC
  Administered 2022-01-31 – 2022-02-03 (×4): 81 mg via ORAL
  Filled 2022-01-31 (×4): qty 1

## 2022-01-31 MED ORDER — ATORVASTATIN CALCIUM 10 MG PO TABS
20.0000 mg | ORAL_TABLET | Freq: Every day | ORAL | Status: DC
  Administered 2022-01-31 – 2022-02-01 (×2): 20 mg via ORAL
  Filled 2022-01-31 (×3): qty 2

## 2022-01-31 MED ORDER — PROCHLORPERAZINE EDISYLATE 10 MG/2ML IJ SOLN: 10.0000 mg | Freq: Four times a day (QID) | INTRAMUSCULAR | Status: DC | PRN

## 2022-01-31 MED ORDER — METHOCARBAMOL 500 MG PO TABS
500.0000 mg | ORAL_TABLET | Freq: Once | ORAL | Status: AC
  Administered 2022-01-31: 500 mg via ORAL
  Filled 2022-01-31: qty 1

## 2022-01-31 MED ORDER — FENTANYL CITRATE PF 50 MCG/ML IJ SOSY: 25.0000 ug | PREFILLED_SYRINGE | INTRAMUSCULAR | Status: DC | PRN

## 2022-01-31 NOTE — Progress Notes (Signed)
Subjective: Denies abdominal pain.  Ate some CLD yesterday.  Had one episode of emesis, but has eaten some this morning and is without symptoms.   Objective: Vital signs in last 24 hours: Temp:  [97.7 F (36.5 C)-100.5 F (38.1 C)] 97.7 F (36.5 C) (11/19 0700) Pulse Rate:  [79-115] 94 (11/19 0645) Resp:  [9-25] 18 (11/19 0645) BP: (82-132)/(49-96) 82/58 (11/19 0645) SpO2:  [87 %-99 %] 96 % (11/19 0645) Weight:  [57.8 kg] 57.8 kg (11/19 0500) Last BM Date : 01/29/22  Intake/Output from previous day: 11/18 0701 - 11/19 0700 In: 3255.6 [P.O.:320; I.V.:2219.1; IV Piggyback:716.5] Out: 825 [Urine:825] Intake/Output this shift: No intake/output data recorded.  PE: Gen: NAD Abd: soft, nontender.  ND, some BS  Lab Results:  Recent Labs    01/30/22 0129 01/31/22 0518  WBC 20.1* 14.2*  HGB 8.4* 7.7*  HCT 26.1* 24.3*  PLT 237 188   BMET Recent Labs    01/30/22 0129 01/31/22 0518  NA 135 133*  K 4.0 4.9  CL 103 100  CO2 18* 18*  GLUCOSE 105* 92  BUN 26* 44*  CREATININE 1.37* 2.01*  CALCIUM 8.3* 7.7*   PT/INR Recent Labs    01/29/22 2011  LABPROT 17.2*  INR 1.4*   CMP     Component Value Date/Time   NA 133 (L) 01/31/2022 0518   NA 138 08/18/2018 1135   K 4.9 01/31/2022 0518   CL 100 01/31/2022 0518   CO2 18 (L) 01/31/2022 0518   GLUCOSE 92 01/31/2022 0518   BUN 44 (H) 01/31/2022 0518   BUN 19 08/18/2018 1135   CREATININE 2.01 (H) 01/31/2022 0518   CREATININE 1.11 10/09/2021 1646   CALCIUM 7.7 (L) 01/31/2022 0518   PROT 6.3 (L) 01/29/2022 2011   ALBUMIN 2.6 (L) 01/29/2022 2011   AST 16 01/29/2022 2011   ALT 9 01/29/2022 2011   ALKPHOS 52 01/29/2022 2011   BILITOT 0.5 01/29/2022 2011   GFRNONAA 33 (L) 01/31/2022 0518   GFRAA 95 08/18/2018 1135   Lipase     Component Value Date/Time   LIPASE 29 01/29/2022 2011       Studies/Results: ECHOCARDIOGRAM COMPLETE  Result Date: 01/30/2022    ECHOCARDIOGRAM REPORT   Patient Name:    Erik [name]. Date of Exam: 01/30/2022 Medical Rec #:  [id number]             Height:       66.0 in Accession #:    [id number]            Weight:       121.3 lb Date of Birth:  04-Jun-1940              BSA:          1.617 m Patient Age:    81 years              BP:           104/57 mmHg Patient Gender: M                     HR:           84 bpm. Exam Location:  Inpatient Procedure: 2D Echo Indications:    cardiomyopathy-ischemic  History:        Patient has prior history of Echocardiogram examinations, most                 recent 07/21/2020. Cardiomyopathy,  CAD, COPD,                 Signs/Symptoms:Murmur; Risk Factors:Dyslipidemia.  Sonographer:    Harvie Junior Referring Phys: [phone] Silvestre Moment [facility]  Sonographer Comments: Technically difficult study due to poor echo windows. IMPRESSIONS  1. Left ventricular ejection fraction, by estimation, is 50 to 55%. The left ventricle has low normal function. The left ventricle has no regional wall motion abnormalities. There is mild left ventricular hypertrophy. Left ventricular diastolic function  could not be evaluated.  2. Right ventricular systolic function is normal. The right ventricular size is normal. There is moderately elevated pulmonary artery systolic pressure.  3. Left atrial size was moderately dilated.  4. The mitral valve is normal in structure. Mild to moderate mitral valve regurgitation. Mild mitral stenosis. Severe mitral annular calcification.  5. The aortic valve has an indeterminant number of cusps. Aortic valve regurgitation is mild. Aortic valve sclerosis/calcification is present, without any evidence of aortic stenosis.  6. The inferior vena cava is dilated in size with <50% respiratory variability, suggesting right atrial pressure of 15 mmHg. FINDINGS  Left Ventricle: Left ventricular ejection fraction, by estimation, is 50 to 55%. The left ventricle has low normal function. The left ventricle has no regional wall motion abnormalities. The left  ventricular internal cavity size was normal in size. There is mild left ventricular hypertrophy. Left ventricular diastolic function could not be evaluated due to mitral annular calcification (moderate or greater). Left ventricular diastolic function could not be evaluated. Right Ventricle: The right ventricular size is normal. Right ventricular systolic function is normal. There is moderately elevated pulmonary artery systolic pressure. The tricuspid regurgitant velocity is 2.76 m/s, and with an assumed right atrial pressure of 15 mmHg, the estimated right ventricular systolic pressure is 03.5 mmHg. Left Atrium: Left atrial size was moderately dilated. Right Atrium: Right atrial size was normal in size. Pericardium: There is no evidence of pericardial effusion. Mitral Valve: The mitral valve is normal in structure. Severe mitral annular calcification. Mild to moderate mitral valve regurgitation. Mild mitral valve stenosis. MV peak gradient, 15.5 mmHg. The mean mitral valve gradient is 5.3 mmHg. Tricuspid Valve: The tricuspid valve is normal in structure. Tricuspid valve regurgitation is trivial. No evidence of tricuspid stenosis. Aortic Valve: The aortic valve has an indeterminant number of cusps. Aortic valve regurgitation is mild. Aortic regurgitation PHT measures 342 msec. Aortic valve sclerosis/calcification is present, without any evidence of aortic stenosis. Aortic valve mean gradient measures 7.0 mmHg. Aortic valve peak gradient measures 14.3 mmHg. Aortic valve area, by VTI measures 1.97 cm. Pulmonic Valve: The pulmonic valve was not well visualized. Pulmonic valve regurgitation is not visualized. No evidence of pulmonic stenosis. Aorta: The aortic root is normal in size and structure. Venous: The inferior vena cava is dilated in size with less than 50% respiratory variability, suggesting right atrial pressure of 15 mmHg. IAS/Shunts: No atrial level shunt detected by color flow Doppler.  LEFT VENTRICLE PLAX  2D LVIDd:         4.50 cm      Diastology LVIDs:         3.30 cm      LV e' medial:    6.20 cm/s LV PW:         0.90 cm      LV E/e' medial:  25.0 LV IVS:        0.70 cm      LV e' lateral:   4.68 cm/s LVOT diam:  1.90 cm      LV E/e' lateral: 33.1 LV SV:         62 LV SV Index:   38 LVOT Area:     2.84 cm  LV Volumes (MOD) LV vol d, MOD A2C: 107.0 ml LV vol d, MOD A4C: 123.0 ml LV vol s, MOD A2C: 57.4 ml LV vol s, MOD A4C: 58.5 ml LV SV MOD A2C:     49.6 ml LV SV MOD A4C:     123.0 ml LV SV MOD BP:      59.3 ml RIGHT VENTRICLE RV Basal diam:  4.10 cm RV Mid diam:    3.30 cm RV S prime:     14.00 cm/s TAPSE (M-mode): 2.5 cm LEFT ATRIUM             Index        RIGHT ATRIUM           Index LA diam:        3.40 cm 2.10 cm/m   RA Area:     16.50 cm LA Vol (A2C):   69.3 ml 42.87 ml/m  RA Volume:   47.40 ml  29.32 ml/m LA Vol (A4C):   71.5 ml 44.23 ml/m LA Biplane Vol: 71.0 ml 43.92 ml/m  AORTIC VALVE                     PULMONIC VALVE AV Area (Vmax):    1.83 cm      PV Vmax:       1.38 m/s AV Area (Vmean):   1.78 cm      PV Peak grad:  7.6 mmHg AV Area (VTI):     1.97 cm AV Vmax:           189.00 cm/s AV Vmean:          119.000 cm/s AV VTI:            0.312 m AV Peak Grad:      14.3 mmHg AV Mean Grad:      7.0 mmHg LVOT Vmax:         122.00 cm/s LVOT Vmean:        74.700 cm/s LVOT VTI:          0.217 m LVOT/AV VTI ratio: 0.70 AI PHT:            342 msec  AORTA Ao Root diam: 3.40 cm MITRAL VALVE                TRICUSPID VALVE MV Area (PHT): 4.52 cm     TR Peak grad:   30.5 mmHg MV Area VTI:   1.23 cm     TR Vmax:        276.00 cm/s MV Peak grad:  15.5 mmHg MV Mean grad:  5.3 mmHg     SHUNTS MV Vmax:       1.97 m/s     Systemic VTI:  0.22 m MV Vmean:      106.3 cm/s   Systemic Diam: 1.90 cm MV Decel Time: 168 msec MR Peak grad: 113.6 mmHg MR Vmax:      533.00 cm/s MV E velocity: 155.00 cm/s MV A velocity: 165.00 cm/s MV E/A ratio:  0.94 Kirk Ruths MD Electronically signed by Kirk Ruths MD Signature  Date/Time: 01/30/2022/2:43:38 PM    Final    DG Abd Portable 1V  Result Date: 01/30/2022 CLINICAL DATA:  Small bowel obstruction. EXAM: PORTABLE ABDOMEN -  1 VIEW COMPARISON:  01/29/2022 CT and prior studies FINDINGS: A mildly distended loop of small bowel within the central abdomen is noted. No other gas-filled dilated bowel loops are present. Gas in the colon is present. Contrast in the bladder from recent CT noted. Aorta bi-iliac stent again identified. IMPRESSION: Mildly distended loop of small bowel within the central abdomen. No other dilated bowel loops identified on this examination. Electronically Signed   By: Margarette Canada M.D.   On: 01/30/2022 09:36   CT Abdomen Pelvis W Contrast  Result Date: 01/29/2022 CLINICAL DATA:  Sepsis, abdominal pain EXAM: CT ABDOMEN AND PELVIS WITH CONTRAST TECHNIQUE: Multidetector CT imaging of the abdomen and pelvis was performed using the standard protocol following bolus administration of intravenous contrast. RADIATION DOSE REDUCTION: This exam was performed according to the departmental dose-optimization program which includes automated exposure control, adjustment of the mA and/or kV according to patient size and/or use of iterative reconstruction technique. CONTRAST:  43m OMNIPAQUE IOHEXOL 350 MG/ML SOLN COMPARISON:  10/29/2021 FINDINGS: Lower chest: No acute abnormality. Coronary artery and aortic calcifications. Hepatobiliary: No focal hepatic abnormality. Gallbladder unremarkable. Pancreas: No focal abnormality or ductal dilatation. Spleen: No focal abnormality.  Normal size. Adrenals/Urinary Tract: No adrenal abnormality. No focal renal abnormality. No stones or hydronephrosis. Urinary bladder is unremarkable. Stomach/Bowel: Dilated small bowel loops noted in the abdomen and continuing into the pelvis. Distal small bowel loops are decompressed. Cannot exclude mild distal small bowel obstruction. 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 Maximum aneurysm sac size 4.5 cm compared to 4.6 cm previously. No adenopathy Reproductive: Prostate enlargement. Other: No free fluid or free air. Musculoskeletal: No acute bony abnormality. IMPRESSION: Mildly dilated and fluid-filled small bowel loops continue to the pelvis. Distal small bowel is decompressed. Cannot exclude mild distal small bowel obstruction. Electronically Signed   By: KRolm BaptiseM.D.   On: 01/29/2022 22:41   DG Chest Port 1 View  Result Date: 01/29/2022 CLINICAL DATA:  Fever, shortness of breath EXAM: PORTABLE CHEST 1 VIEW COMPARISON:  CT chest dated 06/16/2021 FINDINGS: Lungs are clear.  No pleural effusion or pneumothorax. The heart is normal in size. Aortic stent at the aortic hiatus. IMPRESSION: No evidence of acute cardiopulmonary disease. Electronically Signed   By: SJulian HyM.D.   On: 01/29/2022 20:48    Anti-infectives: Anti-infectives (From admission, onward)    Start     Dose/Rate Route Frequency Ordered Stop   01/31/22 2100  vancomycin (VANCOCIN) IVPB 1000 mg/200 mL premix  Status:  Discontinued        1,000 mg 200 mL/hr over 60 Minutes Intravenous Every 48 hours 01/29/22 2214 01/30/22 0729   01/30/22 2200  vancomycin (VANCOREADY) IVPB 750 mg/150 mL  Status:  Discontinued        750 mg 150 mL/hr over 60 Minutes Intravenous Every 24 hours 01/30/22 0729 01/30/22 1106   01/30/22 1200  ceFAZolin (ANCEF) IVPB 2g/100 mL premix        2 g 200 mL/hr over 30 Minutes Intravenous Every 8 hours 01/30/22 1107     01/30/22 0900  ceFEPIme (MAXIPIME) 2 g in sodium chloride 0.9 % 100 mL IVPB  Status:  Discontinued        2 g 200 mL/hr over 30 Minutes Intravenous Every 12 hours 01/29/22 2214 01/30/22 0014   01/30/22 0600  piperacillin-tazobactam (ZOSYN) IVPB 3.375 g  Status:  Discontinued        3.375 g 12.5 mL/hr over 240 Minutes Intravenous Every  8 hours 01/30/22 0102 01/30/22 1106   01/29/22 2030  ceFEPIme (MAXIPIME) 2 g in sodium chloride 0.9 % 100  mL IVPB        2 g 200 mL/hr over 30 Minutes Intravenous  Once 01/29/22 2016 01/29/22 2138   01/29/22 2030  metroNIDAZOLE (FLAGYL) IVPB 500 mg        500 mg 100 mL/hr over 60 Minutes Intravenous  Once 01/29/22 2016 01/29/22 2249   01/29/22 2030  vancomycin (VANCOCIN) IVPB 1000 mg/200 mL premix        1,000 mg 200 mL/hr over 60 Minutes Intravenous  Once 01/29/22 2016 01/29/22 2253        Assessment/Plan Sepsis, secondary to staph bacteremia, Possible pSBO -CT concerning for possible pSBO last night.  Had a BM in the ED though and denies any abdominal pain -1 episode of emesis yesterday, but no further today with his CLD -blood cx + for staph and source of his sepsis. -his diet may be ADAT from our standpoint. -no surgical needs at this time.  D/w Dr. Valeta Harms.  We will sign off but are available as needed.  FEN - CLD, may ADAT/IVFs VTE - heparin ID - zosyn, vanc  Cough CAD CHF COPD GERD HLD HTN Ischemic cardiomyopathy  I reviewed hospitalist notes, last 24 h vitals and pain scores, last 48 h intake and output, last 24 h labs and trends, and last 24 h imaging results.   LOS: 1 day    Henreitta Cea , [facility] 01/31/2022, 8:21 AM Please see Amion for pager number during day hours 7:00am-4:30pm or 7:00am -11:30am on weekends

## 2022-01-31 NOTE — Consult Note (Addendum)
[facility] for Infectious Disease    Date of Admission:  01/29/2022   Total days of inpatient antibiotics 2        Reason for Consult: MSSA bacteremia    Principal Problem:   Shock ([place])   Assessment: 69 YM admitted with:  #MSSA bacteremia #Possible SBO  #Hx of abdominal pain, followed by GI outpatient(2020) #Peripheral Vascular disease #COPD -Pt's wife reports he was in his usual state of health until day of admission when stared c/o abdominal pain. On arrival he had a temp of 103, WBC 20 K.  Blood cultures grew 2/2 MSSA on 11/17. -CT abdomen pelvis showed mildly dilated fluid-filled small bowel loops, distal small bowel decompressed cannot exclude SBO.  Chest x-ray negative. -General surgery engaged, noted pt had BM in ED. No plans for surgical intervention. He had been followed by GI(Dr. Hilarie Fredrickson) in the past for abdominal pain and loose stools(last seen in November 2020) -TTE was technically difficult,, will get TEE. I spoke with family at bedside they report that about a month ago pt hand right middle finger wound from working in his work shop with wood. It was inflamed and tender, wife states that she was concerned enough that she requested pt get it check out by doctor. Pt did not seek medical attention and the redness has since resolved. I suspect this may have been the port of entry for bacteremia Recommendations: -Continue cefazolin  -Follow repeat blood Cx to ensure clearance -Remove lines -TEE  I have personally spent 90 minutes involved in face-to-face and non-face-to-face activities for this patient on the day of the visit. Professional time spent includes the following activities: Preparing to see the patient (review of tests), Obtaining and/or reviewing separately obtained history (admission/discharge record), Performing a medically appropriate examination and/or evaluation , Ordering medications/tests/procedures, referring and communicating with other  health care professionals, Documenting clinical information in the EMR, Independently interpreting results (not separately reported), Communicating results to the patient/family/caregiver, Counseling and educating the patient/family/caregiver and Care coordination (not separately reported).   Microbiology:   Antibiotics: Cefazolin  Cultures: Blood 11/17 2/2 MSSA r   HPI: Rieley Hausman. is a 81 year old male with hip history of ischemic cardiomyopathy, CAD status post PCI heart failure with reduced ejection fraction, COPD, aortic aneurysm presented with chills, cough, abdominal pain.  Reports that his symptoms started day of admission.  On arrival to the ED he had a temp of 103, WBC 20 K.  Initially started on broad-spectrum antibiotics with Vanco, PIP Tazo, Gentasol blood cultures returned positive for MSSA transitioned to cefazolin.  ID engaged as part of MSSA out of consult.  TTE did not show vegetation but noted technically difficult.  CT abdomen pelvis showed mildly dilated fluid-filled small bowel loops, distal small bowel decompressed cannot exclude SBO.  Chest x-ray negative.  General surgery engaged for possible SBO.  Noted to have bowel movement in the ED.  No plans for surgical intervention.   Review of Systems: Review of Systems  All other systems reviewed and are negative.   Past Medical History:  Diagnosis Date   Adenomatous colon polyp    Aortic aneurysm (HCC)    BPH (benign prostatic hypertrophy)    CAD (coronary artery disease)    Carotid artery stenosis    a. Bilateral CEA   Cataract    CHF (congestive heart failure) (HCC)    Chronic systolic heart failure (HCC)    a. EF 20-25%, mild LVH,  mod HK, mid apicalanteroseptal myocardium, mild MR, LA mod dilated   Collagen vascular disease (HCC)    COPD (chronic obstructive pulmonary disease) (HCC)    Coronary artery disease    a. LHC (08/2013): [place]: short 30% distal, LAD: sml D1 & D2, 70% ostial D1, 95-99% LAD  stenosis prox D2 LCx: sml/mod ramus subtot. occluded, 40% ostial set off lg OM1, 40% AV LCx after OM1, RCA: 90% prox (DES to RCA and prox LAD)   Diverticulosis    GERD (gastroesophageal reflux disease)    Heart murmur    History of colonic polyps    Hyperlipidemia    Hyperplastic colon polyp    Hypertension    Ischemic cardiomyopathy    RLS (restless legs syndrome)     Social History   Tobacco Use   Smoking status: Every Day    Packs/day: 1.00    Years: 50.00    Total pack years: 50.00    Types: Cigarettes   Smokeless tobacco: Former    Quit date: 09/10/2013   Tobacco comments:    off and on since age 11, currently smokes a pack a day [facility] 12/23/20  Vaping Use   Vaping Use: Never used  Substance Use Topics   Alcohol use: No   Drug use: No    Family History  Problem Relation Age of Onset   Heart disease Brother    Hyperlipidemia Brother    Pancreatic cancer Brother    Hypertension Mother    Cancer Father        unknown type; sounds GI   Emphysema Father    Hyperlipidemia Brother    Hyperlipidemia Brother    Heart attack Brother    Hyperlipidemia Brother    Multiple sclerosis Brother    Kidney disease Neg Hx    Prostate cancer Neg Hx    Colon cancer Neg Hx    Esophageal cancer Neg Hx    Rectal cancer Neg Hx    Stomach cancer Neg Hx    Scheduled Meds:  aspirin  81 mg Per Tube Daily   atorvastatin  20 mg Per Tube Daily   Chlorhexidine Gluconate Cloth  6 each Topical Daily   heparin  5,000 Units Subcutaneous Q8H   Continuous Infusions:  sodium chloride Stopped (01/29/22 2310)    ceFAZolin (ANCEF) IV     norepinephrine (LEVOPHED) Adult infusion 2 mcg/min (01/31/22 0600)   PRN Meds:.fentaNYL (SUBLIMAZE) injection, ondansetron (ZOFRAN) IV, mouth rinse Allergies  Allergen Reactions   Codeine Swelling    throat swells    OBJECTIVE: Blood pressure (!) 82/58, pulse 94, temperature 97.7 F (36.5 C), temperature source Oral, resp. rate 18, height '5\' 6"'$  (1.676  m), weight 57.8 kg, SpO2 96 %.  Physical Exam Constitutional:      General: He is not in acute distress.    Appearance: He is normal weight. He is not toxic-appearing.  HENT:     Head: Normocephalic and atraumatic.     Right Ear: External ear normal.     Left Ear: External ear normal.     Nose: No congestion or rhinorrhea.     Mouth/Throat:     Mouth: Mucous membranes are moist.     Pharynx: Oropharynx is clear.  Eyes:     Extraocular Movements: Extraocular movements intact.     Conjunctiva/sclera: Conjunctivae normal.     Pupils: Pupils are equal, round, and reactive to light.  Cardiovascular:     Rate and Rhythm: Normal rate and regular rhythm.  Heart sounds: No murmur heard.    No friction rub. No gallop.  Pulmonary:     Effort: Pulmonary effort is normal.     Breath sounds: Normal breath sounds.  Abdominal:     General: Abdomen is flat. Bowel sounds are normal.     Palpations: Abdomen is soft.  Musculoskeletal:        General: No swelling. Normal range of motion.     Cervical back: Normal range of motion and neck supple.  Skin:    General: Skin is warm and dry.  Neurological:     General: No focal deficit present.     Mental Status: He is oriented to person, place, and time.  Psychiatric:        Mood and Affect: Mood normal.     Lab Results Lab Results  Component Value Date   WBC 14.2 (H) 01/31/2022   HGB 7.7 (L) 01/31/2022   HCT 24.3 (L) 01/31/2022   MCV 91.7 01/31/2022   PLT 188 01/31/2022    Lab Results  Component Value Date   CREATININE 2.01 (H) 01/31/2022   BUN 44 (H) 01/31/2022   NA 133 (L) 01/31/2022   K 4.9 01/31/2022   CL 100 01/31/2022   CO2 18 (L) 01/31/2022    Lab Results  Component Value Date   ALT 9 01/29/2022   AST 16 01/29/2022   ALKPHOS 52 01/29/2022   BILITOT 0.5 01/29/2022       Laurice Record, [facility] 01/31/2022, 10:22 AM

## 2022-01-31 NOTE — Progress Notes (Signed)
NAME:  Erik [name]., MRN:  [id number], DOB:  1941/01/10, LOS: 1 ADMISSION DATE:  01/29/2022, CONSULTATION DATE: 11/17 REFERRING MD: Dr. Maylon Peppers, CHIEF COMPLAINT: Shock  History of Present Illness:  81 year old male with past medical history as below, which is significant for ischemic cardiomyopathy, CAD status post PCI, heart failure reduced ejection fraction, COPD, and aortic aneurysm.  He was in his usual state of health until 11/17 when he developed chills, cough, and mild abdominal pain.  Denies productive cough, nausea, vomiting, diarrhea.  No other complaints.  Upon arrival to the emergency department he was noted to be hypotensive refractory to IV fluid resuscitation and was started on norepinephrine infusion.  Work-up included chest x-ray which was clear and CT abdomen and pelvis which was concerning for possible early small bowel obstruction.  Laboratory evaluation was significant for serum creatinine of 1.3, lactic acid 2.5, and hemoglobin 9.  PCCM was requested to admit to ICU for vasopressor management.  Pertinent  Medical History   has a past medical history of Adenomatous colon polyp, Aortic aneurysm ([place]), BPH (benign prostatic hypertrophy), CAD (coronary artery disease), Carotid artery stenosis, Cataract, CHF (congestive heart failure) ([place]), Chronic systolic heart failure ([place]), Collagen vascular disease ([place]), COPD (chronic obstructive pulmonary disease) ([place]), Coronary artery disease, Diverticulosis, GERD (gastroesophageal reflux disease), Heart murmur, History of colonic polyps, Hyperlipidemia, Hyperplastic colon polyp, Hypertension, Ischemic cardiomyopathy, and RLS (restless legs syndrome).   Significant Hospital Events: Including procedures, antibiotic start and stop dates in addition to other pertinent events   11/17 admitted for shock on pressors  Interim History / Subjective:  This morning his biggest complaint is his restless leg syndrome. He had back pain  yesterday that has resolved. No joint pains. Went to the dentist for a routine cleaning a few weeks ago, but no other procedures. No skin infections.   Objective   Blood pressure (!) 82/58, pulse 94, temperature 97.7 F (36.5 C), temperature source Oral, resp. rate 18, height '5\' 6"'$  (1.676 m), weight 57.8 kg, SpO2 96 %.        Intake/Output Summary (Last 24 hours) at 01/31/2022 0936 Last data filed at 01/31/2022 0600 Gross per 24 hour  Intake 3255.57 ml  Output 675 ml  Net 2580.57 ml    Net +5.3L this admission   Filed Weights   01/29/22 2016 01/30/22 0600 01/31/22 0500  Weight: 49.9 kg 55 kg 57.8 kg    Examination: General: ill appearing man lying in bed in NAD, slightly flushed HENT: [place]/AT, eyes anicteric Lungs: breathing comfortably on [place], faint left basilar rhales, otherwise CTA. No conversational dyspnea.  Cardiovascular: Regular rhythm S1-S2 Abdomen: Generalized tenderness in the abdomen, mildly distended, hypoactive bowel sounds Extremities: No significant edema Neuro: Alert oriented following commands  Na+ 133 Bicarb 18  BUN 44 Cr 2.01 LA 2.7 WBC 14.2 H/H 7.7/24.3  Echo: LVEF 50-55%, Normal RV function, moderately elevated RVSP. Moderately dilated LA. Mild to moderate MR, mild MS. Mild AI. Dilated IVC with reduced variability.  Resolved Hospital Problem list     Assessment & Plan:   Septic shock due to MSSA bacteremia- unclear initial source/ portal of entry -NE PRN to maintain MAP >65 -ID consult for staph bacteremia -cefazolin -repeat blood cultures ordered -echo completed without evidence of endocarditis; likely needs TEE -no CVC; trying to avoid one as long as possible until blood cultures clear  Possible small bowel obstruction: described as a possible early obstruction on CT. Patient has had 3 bowel movements in the ED.  No diarrhea. One episode appeared dark/tarry but hemoccult negative.  -appreciate surgery's input; no evidence of obstruction  currently  Chronic HFrEF (LVEF 40-45% on last echo) 2/2 ICM> appears to have had some recovery of EF CAD -holding PTA [name], Farxiga, Blbocker, diuretics -can resume statin, hold ezetimibe for now -tele monitoring  AKI due to septic shock, worsening Hypokalemia Lactic acidosis -monitor renal function -replete electrolytes PRN -maintain adequate renal perfusion -strict I/Os -renally dose meds, avoid nephrotoxic meds -switching morphine to fentanyl  Anemia, continuing to worsen. Worry about hemolysis with acute illness.  -check DIC panel -monitor for signs of bleeding -recheck in AM -transfuse for Hb <7 or hemodynamically significant bleeding -holding [place] heparin due to concern for potential bleeding with dropping H/h  Coagulopathy due to sepsis, present on admit -check DIC panel  Hyponatremia -monitor  RLS -resume ropinirole -switch phenergan to compazine -SCDs should help  Deconditioning -PT, OT  Wife updated at bedside during rounds.   Best Practice (right click and "Reselect all SmartList Selections" daily)   Diet/type: Regular consistency (see orders) DVT prophylaxis: SCD GI prophylaxis: PPI Lines: N/A Foley:  N/A Code Status:  full code Last date of multidisciplinary goals of care discussion [wife updated at bedside.]  Labs   CBC: Recent Labs  Lab 01/29/22 2011 01/30/22 0129 01/31/22 0518  WBC 7.9 20.1* 14.2*  NEUTROABS 7.5  --   --   HGB 9.0* 8.4* 7.7*  HCT 27.0* 26.1* 24.3*  MCV 89.4 90.6 91.7  PLT 249 237 188     Basic Metabolic Panel: Recent Labs  Lab 01/29/22 2011 01/30/22 0129 01/31/22 0518  NA 135 135 133*  K 3.6 4.0 4.9  CL 105 103 100  CO2 19* 18* 18*  GLUCOSE 121* 105* 92  BUN 26* 26* 44*  CREATININE 1.30* 1.37* 2.01*  CALCIUM 8.3* 8.3* 7.7*  MG  --  1.7 2.3  PHOS  --  2.9 4.5    GFR: Estimated Creatinine Clearance: 24 mL/min (A) (by C-G formula based on SCr of 2.01 mg/dL (H)). Recent Labs  Lab 01/29/22 2011  01/29/22 2217 01/30/22 0129 01/30/22 2033 01/31/22 0518  PROCALCITON  --   --  46.95  --   --   WBC 7.9  --  20.1*  --  14.2*  LATICACIDVEN 2.5* 2.2*  --  2.7* 2.7*      This patient is critically ill with multiple organ system failure; which, requires frequent high complexity decision making, assessment, support, evaluation, and titration of therapies. This was completed through the application of advanced monitoring technologies and extensive interpretation of multiple databases. During this encounter critical care time was devoted to patient care services described in this note for 40 minutes.   Julian Hy, DO 01/31/22 10:49 AM [facility]  For contact information, see Amion. If no response to pager, please call PCCM consult pager. After hours, 7PM- 7AM, please call [place].

## 2022-01-31 NOTE — Progress Notes (Signed)
PHARMACY NOTE:  ANTIMICROBIAL RENAL DOSAGE ADJUSTMENT  Current antimicrobial regimen includes a mismatch between antimicrobial dosage and estimated renal function.  As per policy approved by the Pharmacy & Therapeutics and Medical Executive Committees, the antimicrobial dosage will be adjusted accordingly.  Current antimicrobial dosage:  Cefazolin 2g Q8H  Indication: MSSA Bacteremia  Renal Function: AKI is worsening - Scr 2.01 with CrCl 24 mL/min  Estimated Creatinine Clearance: 24 mL/min (A) (by C-G formula based on SCr of 2.01 mg/dL (H)). '[]'$      On intermittent HD, scheduled: '[]'$      On CRRT    Antimicrobial dosage has been changed to:  Cefazolin 2g Q12H   Thank you for allowing pharmacy to be a part of this patient's care.  Erskine Speed, PharmD Clinical Pharmacist 01/31/2022 8:56 AM

## 2022-02-01 ENCOUNTER — Inpatient Hospital Stay (HOSPITAL_COMMUNITY): Payer: Medicare Other

## 2022-02-01 DIAGNOSIS — E43 Unspecified severe protein-calorie malnutrition: Secondary | ICD-10-CM | POA: Insufficient documentation

## 2022-02-01 DIAGNOSIS — R7881 Bacteremia: Secondary | ICD-10-CM | POA: Diagnosis not present

## 2022-02-01 DIAGNOSIS — R579 Shock, unspecified: Secondary | ICD-10-CM | POA: Diagnosis not present

## 2022-02-01 DIAGNOSIS — N179 Acute kidney failure, unspecified: Secondary | ICD-10-CM

## 2022-02-01 DIAGNOSIS — B9561 Methicillin susceptible Staphylococcus aureus infection as the cause of diseases classified elsewhere: Secondary | ICD-10-CM | POA: Diagnosis not present

## 2022-02-01 LAB — GLUCOSE, CAPILLARY: Glucose-Capillary: 111 mg/dL — ABNORMAL HIGH (ref 70–99)

## 2022-02-01 LAB — URINE CULTURE: Culture: 200 — AB

## 2022-02-01 LAB — BASIC METABOLIC PANEL
Anion gap: 7 (ref 5–15)
BUN: 58 mg/dL — ABNORMAL HIGH (ref 8–23)
CO2: 20 mmol/L — ABNORMAL LOW (ref 22–32)
Calcium: 7.2 mg/dL — ABNORMAL LOW (ref 8.9–10.3)
Chloride: 106 mmol/L (ref 98–111)
Creatinine, Ser: 1.8 mg/dL — ABNORMAL HIGH (ref 0.61–1.24)
GFR, Estimated: 38 mL/min — ABNORMAL LOW (ref >60)
Glucose, Bld: 107 mg/dL — ABNORMAL HIGH (ref 70–99)
Potassium: 4.4 mmol/L (ref 3.5–5.1)
Sodium: 133 mmol/L — ABNORMAL LOW (ref 135–145)

## 2022-02-01 LAB — CBC
HCT: 23.7 % — ABNORMAL LOW (ref 39.0–52.0)
Hemoglobin: 7.6 g/dL — ABNORMAL LOW (ref 13.0–17.0)
MCH: 28.9 pg (ref 26.0–34.0)
MCHC: 32.1 g/dL (ref 30.0–36.0)
MCV: 90.1 fL (ref 80.0–100.0)
Platelets: 137 10*3/uL — ABNORMAL LOW (ref 150–400)
RBC: 2.63 MIL/uL — ABNORMAL LOW (ref 4.22–5.81)
RDW: 13.7 % (ref 11.5–15.5)
WBC: 10.5 10*3/uL (ref 4.0–10.5)
nRBC: 0 % (ref 0.0–0.2)

## 2022-02-01 LAB — CULTURE, BLOOD (ROUTINE X 2)

## 2022-02-01 LAB — LACTIC ACID, PLASMA: Lactic Acid, Venous: 0.9 mmol/L (ref 0.5–1.9)

## 2022-02-01 MED ORDER — PHYTONADIONE 5 MG PO TABS
10.0000 mg | ORAL_TABLET | Freq: Once | ORAL | Status: AC
  Administered 2022-02-01: 10 mg via ORAL
  Filled 2022-02-01: qty 2

## 2022-02-01 MED ORDER — HEPARIN SODIUM (PORCINE) 5000 UNIT/ML IJ SOLN
5000.0000 [IU] | Freq: Three times a day (TID) | INTRAMUSCULAR | Status: DC
  Administered 2022-02-01 – 2022-02-03 (×7): 5000 [IU] via SUBCUTANEOUS
  Filled 2022-02-01 (×6): qty 1

## 2022-02-01 MED ORDER — CLOPIDOGREL BISULFATE 75 MG PO TABS
75.0000 mg | ORAL_TABLET | Freq: Every day | ORAL | Status: DC
  Administered 2022-02-01 – 2022-02-03 (×3): 75 mg via ORAL
  Filled 2022-02-01 (×3): qty 1

## 2022-02-01 MED ORDER — MIRTAZAPINE 15 MG PO TABS
15.0000 mg | ORAL_TABLET | Freq: Every day | ORAL | Status: DC
  Administered 2022-02-02: 15 mg via ORAL
  Filled 2022-02-01: qty 1

## 2022-02-01 MED ORDER — ADULT MULTIVITAMIN W/MINERALS CH
1.0000 | ORAL_TABLET | Freq: Every day | ORAL | Status: DC
  Administered 2022-02-01 – 2022-02-03 (×3): 1 via ORAL
  Filled 2022-02-01 (×3): qty 1

## 2022-02-01 MED ORDER — ENSURE ENLIVE PO LIQD
237.0000 mL | Freq: Three times a day (TID) | ORAL | Status: DC
  Administered 2022-02-01 – 2022-02-02 (×3): 237 mL via ORAL

## 2022-02-01 MED ORDER — EZETIMIBE 10 MG PO TABS
10.0000 mg | ORAL_TABLET | Freq: Every day | ORAL | Status: DC
  Administered 2022-02-01 – 2022-02-03 (×3): 10 mg via ORAL
  Filled 2022-02-01 (×3): qty 1

## 2022-02-01 MED ORDER — FAMOTIDINE 20 MG PO TABS
20.0000 mg | ORAL_TABLET | Freq: Every day | ORAL | Status: DC
  Administered 2022-02-02: 20 mg via ORAL
  Filled 2022-02-01: qty 1

## 2022-02-01 MED ORDER — TAMSULOSIN HCL 0.4 MG PO CAPS
0.4000 mg | ORAL_CAPSULE | Freq: Every day | ORAL | Status: DC
  Administered 2022-02-01 – 2022-02-03 (×3): 0.4 mg via ORAL
  Filled 2022-02-01 (×3): qty 1

## 2022-02-01 MED ORDER — OXYCODONE HCL 5 MG PO TABS
5.0000 mg | ORAL_TABLET | Freq: Once | ORAL | Status: AC
  Administered 2022-02-01: 5 mg via ORAL
  Filled 2022-02-01: qty 1

## 2022-02-01 NOTE — Progress Notes (Signed)
    [facility] for Infectious Disease    Date of Admission:  01/29/2022   Total days of antibiotics 4   ID: Erik [name]. is a 81 y.o. male with MSSA bacteremia Principal Problem:   Shock ([place]) Active Problems:   Protein-calorie malnutrition, severe    Subjective: Able to ambulate around the icu. No fevers. Denies pains to hands   Medications:   aspirin  81 mg Oral Daily   atorvastatin  20 mg Oral Daily   Chlorhexidine Gluconate Cloth  6 each Topical Daily   clopidogrel  75 mg Oral Daily   ezetimibe  10 mg Oral Daily   famotidine  20 mg Oral QHS   feeding supplement  237 mL Oral TID BM   heparin injection (subcutaneous)  5,000 Units Subcutaneous Q8H   mirtazapine  15 mg Oral QHS   multivitamin with minerals  1 tablet Oral Daily   rOPINIRole  1.5 mg Oral Daily   rOPINIRole  3 mg Oral QHS   tamsulosin  0.4 mg Oral Daily    Objective: Vital signs in last 24 hours: Temp:  [97.5 F (36.4 C)-98.4 F (36.9 C)] 98.2 F (36.8 C) (11/20 1528) Pulse Rate:  [75-91] 88 (11/20 1700) Resp:  [11-24] 19 (11/20 1700) BP: (100-130)/(48-74) 124/66 (11/20 1700) SpO2:  [91 %-100 %] 93 % (11/20 1700) Weight:  [57.8 kg] 57.8 kg (11/20 0235)  Physical Exam  Constitutional: He is oriented to person, place, and time. He appears well-developed and well-nourished. No distress.  HENT:  Mouth/Throat: Oropharynx is clear and moist. No oropharyngeal exudate.  Cardiovascular: Normal rate, regular rhythm and normal heart sounds. Exam reveals no gallop and no friction rub.  No murmur heard.  Pulmonary/Chest: Effort normal and breath sounds normal. No respiratory distress. He has no wheezes.  Abdominal: Soft. Bowel sounds are normal. He exhibits no distension. There is no tenderness.  Lymphadenopathy:  He has no cervical adenopathy.  Neurological: He is alert and oriented to person, place, and time.  Skin: Skin is warm and dry. No rash noted. No erythema.  Psychiatric: He has a normal  mood and affect. His behavior is normal.    Lab Results Recent Labs    01/31/22 0518 02/01/22 0212  WBC 14.2* 10.5  HGB 7.7* 7.6*  HCT 24.3* 23.7*  NA 133* 133*  K 4.9 4.4  CL 100 106  CO2 18* 20*  BUN 44* 58*  CREATININE 2.01* 1.80*   Liver Panel Recent Labs    01/29/22 2011  PROT 6.3*  ALBUMIN 2.6*  AST 16  ALT 9  ALKPHOS 52  BILITOT 0.5   Sedimentation Rate No results for input(s): "ESRSEDRATE" in the last 72 hours. C-Reactive Protein No results for input(s): "CRP" in the last 72 hours.  Microbiology: Blood cx 11/17 MSSA Blood cx 11/19 NGTD Studies/Results: No results found.   Assessment/Plan: MSSA bacteremia = continue on cefazolin. Had history of hand injury remotely however appears completely improved. Not convinced this was source of bacteremia. Recommend to get TEE. Continue to follow repeat blood cx  Leukocytosis = resolved since receiving appropriate abtx and IVF. Not requiring pressors any longer.  Aki = will dose adjust and provide IVF but monitor for risk of pulm edema given hx of ICM  [facility] for Infectious Diseases Pager: [phone]  02/01/2022, 5:49 PM

## 2022-02-01 NOTE — Progress Notes (Signed)
Initial Nutrition Assessment  DOCUMENTATION CODES:   Severe malnutrition in context of chronic illness  INTERVENTION:  DC kcal count Continue regular diet as ordered, encourage PO intake MVI with minerals daily Ensure Enlive po TID, each supplement provides 350 kcal and 20 grams of protein.  NUTRITION DIAGNOSIS:  Severe Malnutrition (in the context of chronic illness) related to poor appetite, mouth pain as evidenced by severe fat depletion, severe muscle depletion.  GOAL:  Patient will meet greater than or equal to 90% of their needs  MONITOR:  PO intake, Supplement acceptance, Labs, Weight trends  REASON FOR ASSESSMENT:  Consult Calorie Count  ASSESSMENT:  Pt with hx of CHF, CAD, HTN, HLD, COPD, and GERD presented to ED with fever and abdominal pain. Imaging suggestive of pSBO.  Surgery evaluated pt and felt there was no role for surgical intervention at this time as pt is tolerating diet and having bowel movements.   Kcal count order placed by RN last night with CCM as cosigner. However, diet has now been advanced and per RN intake is improving. Resident ok with dc kcal count as no interventions are planned based on the results.   Met with pt and wife at bedside. Reports that poor PO has been ongoing for a few months. Pt had a new tooth placed 2-3 months ago and since that time, has had difficulty with his appetite and pain with chewing. Has been losing weight and is currently undergoing an outpatient workup for this. UBW 139 and now down to 110 per pt. Wife reports that he has been drinking high kcal/protein ensure at home. Will enter order to receive while admitted TID and MVI.   Significant fat and muscle deficits present on admission consistent with severe malnutrition.    Intake/Output Summary (Last 24 hours) at 02/01/2022 1422 Last data filed at 02/01/2022 0600 Gross per 24 hour  Intake 438.29 ml  Output 800 ml  Net -361.71 ml  Net IO Since Admission: 5,176.86 mL  [02/01/22 1422]  Nutritionally Relevant Medications: Scheduled Meds:  atorvastatin  20 mg Oral Daily   ezetimibe  10 mg Oral Daily   famotidine  20 mg Oral QHS   mirtazapine  15 mg Oral QHS   Continuous Infusions:  sodium chloride 250 mL (02/01/22 0546)    ceFAZolin (ANCEF) IV 200 mL/hr at 02/01/22 0600   PRN Meds: ondansetron, prochlorperazine  Labs Reviewed: Na 133 BUN 58, creatinine 1.8 CBG ranges from 101-107 mg/dL over the last 24 hours  NUTRITION - FOCUSED PHYSICAL EXAM: Flowsheet Row Most Recent Value  Orbital Region Moderate depletion  Upper Arm Region Severe depletion  Thoracic and Lumbar Region Severe depletion  Buccal Region Moderate depletion  Temple Region Moderate depletion  Clavicle Bone Region Severe depletion  Clavicle and Acromion Bone Region Severe depletion  Scapular Bone Region Moderate depletion  Dorsal Hand Severe depletion  Patellar Region Severe depletion  Anterior Thigh Region Severe depletion  Posterior Calf Region Severe depletion  Edema (RD Assessment) None  Hair Reviewed  Eyes Reviewed  Mouth Reviewed  [dry]  Skin Reviewed  Nails Reviewed   Diet Order:   Diet Order             Diet regular Room service appropriate? Yes; Fluid consistency: Thin  Diet effective now                   EDUCATION NEEDS:   Education needs have been addressed  Skin:  Skin Assessment: Reviewed RN Assessment  Last  BM:  11/18  Height:  Ht Readings from Last 1 Encounters:  01/29/22 5' 6" (1.676 m)    Weight:  Wt Readings from Last 1 Encounters:  02/01/22 57.8 kg    Ideal Body Weight:  64.5 kg  BMI:  Body mass index is 20.57 kg/m.  Estimated Nutritional Needs:  Kcal:  1600-1800 kcal/d Protein:  85-100 g/d Fluid:  1.8-2L/d    Ranell Patrick, RD, LDN Clinical Dietitian RD pager # available in [place]  After hours/weekend pager # available in [facility]

## 2022-02-01 NOTE — Progress Notes (Signed)
Abdominal pain> KUB demonstrates dilated loops of bowel most likely consistent with ileus.  NPO overnight.  Julian Hy, DO 02/01/22 5:52 PM [facility]

## 2022-02-01 NOTE — TOC Initial Note (Signed)
Transition of Care (TOC) - Initial/Assessment Note    Patient Details  Name: Erik [name]. MRN: [id number] Date of Birth: 03/09/1941  Transition of Care [facility]) CM/SW Contact:    Bartholomew Crews, RN Phone Number: [phone] 02/01/2022, 11:03 AM  Clinical Narrative:                  Spoke with patient's spouse on cell phone. She was with patient at the time of call. She was pleased that he was feeling better and sitting up in chair. Discussed PT recommendations - she is agreeable. Offered agency choice. No preference. Discussed HH RN to monitor for changes. Referral accepted by [facility] for RN, PT - patient will need HH Face to Face orders. Spouse to provide transportation home when ready for discharge. TOC following for transition needs.   Expected Discharge Plan: [place] Barriers to Discharge: Continued Medical Work up   Patient Goals and CMS Choice Patient states their goals for this hospitalization and ongoing recovery are:: home with wife CMS Medicare.gov Compare Post Acute Care list provided to:: Patient Choice offered to / list presented to : Patient, Spouse  Expected Discharge Plan and Services Expected Discharge Plan: [place] Acute Care Choice: [place] arrangements for the past 2 months: Single Family Home                           HH Arranged: RN, PT [facility] Agency: [place] Date [facility] Agency Contacted: 02/01/22 Time HH Agency Contacted: 44 Representative spoke with at [place]: Malachy Mood  Prior Living Arrangements/Services Living arrangements for the past 2 months: [place] Lives with:: Spouse Patient language and need for interpreter reviewed:: Yes Do you feel safe going back to the place where you live?: Yes      Need for Family Participation in Patient Care: Yes (Comment) Care giver support system in place?: Yes (comment)   Criminal Activity/Legal Involvement Pertinent  to Current Situation/Hospitalization: No - Comment as needed  Activities of Daily Living      Permission Sought/Granted Permission sought to share information with : Family Supports                Emotional Assessment       Orientation: : Oriented to Place, Oriented to  Time, Oriented to Situation, Oriented to Self Alcohol / Substance Use: Not Applicable Psych Involvement: No (comment)  Admission diagnosis:  Shock ([place]) [R57.9] SBO (small bowel obstruction) ([place]) [K56.609] Anemia, unspecified type [D64.9] Sepsis without acute organ dysfunction, due to unspecified organism [facility]) [A41.9] Patient Active Problem List   Diagnosis Date Noted   Shock ([place]) 01/30/2022   Iron deficiency anemia secondary to inadequate dietary iron intake 11/23/2021   Family history of B12 deficiency 11/23/2021   Decreased appetite 10/11/2021   Epigastric pain 10/11/2021   Weight loss 07/14/2021   Left groin pain 09/04/2020   Hydrocele in adult 09/04/2020   Epididymitis 09/04/2020   Bronchiectasis ([place]) 10/24/2019   Restless leg syndrome 05/30/2018   Abdominal aortic aneurysm (AAA) ([place]) 07/07/2015   Arteriosclerosis of coronary artery 07/07/2015   Cardiomyopathy, ischemic 07/07/2015   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   Solitary pulmonary nodule 04/16/2014   Counseling regarding end of life decision making 04/16/2014   HTN (hypertension) 10/03/2013   CAD S/P percutaneous coronary angioplasty 62/95/2841   Chronic systolic heart failure ([place])  09/19/2013   NSTEMI (non-ST elevated myocardial infarction) ([place]) 09/10/2013   Family history of coronary artery disease in brother 09/10/2013   ED (erectile dysfunction) 12/10/2011   COPD, mild ([place]) 06/20/2008   COLONIC POLYPS, ADENOMATOUS 06/05/2008   BPH (benign prostatic hyperplasia) 06/05/2008   Prediabetes 05/14/2008   VENEREAL WART 05/07/2008   ONYCHOMYCOSIS, TOENAILS 05/07/2008   Hyperlipidemia 05/07/2008    Bilateral carotid artery stenosis 05/07/2008   INTERMITTENT CLAUDICATION 05/07/2008   HEART MURMUR, HX OF 05/07/2008   COLONIC POLYPS, HX OF 05/07/2008   PCP:  [name], MD Pharmacy:   [id number] [place] [id number]: [phone]: [phone]    Social Determinants of Health (SDOH) Interventions    Readmission Risk Interventions     No data to display

## 2022-02-01 NOTE — Progress Notes (Signed)
Attempt to call report x 2. Without success. First attempt at 20:18

## 2022-02-01 NOTE — Progress Notes (Signed)
NAME:  Erik Wild., MRN:  [id number], DOB:  1940-06-19, LOS: 2 ADMISSION DATE:  01/29/2022, CONSULTATION DATE: 11/17 REFERRING MD: Dr. Maylon Peppers, CHIEF COMPLAINT: Shock  History of Present Illness:  81 year old male with past medical history as below, which is significant for ischemic cardiomyopathy, CAD status post PCI, heart failure reduced ejection fraction, COPD, and aortic aneurysm.  He was in his usual state of health until 11/17 when he developed chills, cough, and mild abdominal pain.  Denies productive cough, nausea, vomiting, diarrhea.  No other complaints.  Upon arrival to the emergency department he was noted to be hypotensive refractory to IV fluid resuscitation and was started on norepinephrine infusion.  Work-up included chest x-ray which was clear and CT abdomen and pelvis which was concerning for possible early small bowel obstruction.  Laboratory evaluation was significant for serum creatinine of 1.3, lactic acid 2.5, and hemoglobin 9.  PCCM was requested to admit to ICU for vasopressor management.  Pertinent  Medical History   has a past medical history of Adenomatous colon polyp, Aortic aneurysm ([place]), BPH (benign prostatic hypertrophy), CAD (coronary artery disease), Carotid artery stenosis, Cataract, CHF (congestive heart failure) ([place]), Chronic systolic heart failure ([place]), Collagen vascular disease ([place]), COPD (chronic obstructive pulmonary disease) ([place]), Coronary artery disease, Diverticulosis, GERD (gastroesophageal reflux disease), Heart murmur, History of colonic polyps, Hyperlipidemia, Hyperplastic colon polyp, Hypertension, Ischemic cardiomyopathy, and RLS (restless legs syndrome).   Significant Hospital Events: Including procedures, antibiotic start and stop dates in addition to other pertinent events   11/17 admitted for shock on pressors  Interim History / Subjective:  Walked this morning. Appetite slowly improving. Has been eating soft foods PTA due to  recent dental work.  Objective   Blood pressure 127/60, pulse 84, temperature 97.6 F (36.4 C), temperature source Axillary, resp. rate 13, height '5\' 6"'$  (1.676 m), weight 57.8 kg, SpO2 94 %.        Intake/Output Summary (Last 24 hours) at 02/01/2022 1229 Last data filed at 02/01/2022 0600 Gross per 24 hour  Intake 438.29 ml  Output 800 ml  Net -361.71 ml    Net +5.2L this admission   Filed Weights   01/30/22 0600 01/31/22 0500 02/01/22 0235  Weight: 55 kg 57.8 kg 57.8 kg    Examination: General: ill appearing man lying in bed in NAD HENT: [place]/AT, eyes anicteric Lungs: faint rhales bilaterally, was coughing when walking. Breathing comfortably on RA Cardiovascular: S1S2, RRR Abdomen: soft, NT Extremities: no peripheral edema, no cyanosis Neuro: sleeping but arouses to stimulation, answering questions appropriately  Na+ 133 Bicarb 120 BUN 58 Cr 1.8 LA 0.9 WBC 140.5 H/H 7.6/23.7 INR 2.2  Resolved Hospital Problem list     Assessment & Plan:   Septic shock due to MSSA bacteremia- unclear initial source/ portal of entry -off NE -Appreciate ID's management -TEE on 11/22 -con't cefazolin -repeat blood cultures pending -no CVC, trying to avoid until we are sure bacteremia has cleared  Chronic HFrEF (LVEF 40-45% on last echo) 2/2 ICM> appears to have had some recovery of EF CAD -Holding PTA Entresto, farxiga, diuretics, and Bblocker -con't statin, holding ezetimibe  -monitor on tele  AKI due to septic shock, stable Hypokalemia Lactic acidosis -monitor daily -strict I/O -renally dose meds, avoid nephrotoxic meds -maintain perfusion  Coagulopathy 2/2 sepsis, present on admission -vit K  Anemia, acute on chronic- currently stable. No DIC.  -monitor for bleeding -serial monitoring -transfuse for Hb <7 or hemodynamically significant bleeding -ok for Turlock heparin  Hyponatremia -cont to monitor  RLS -con't PTA ropinirole -avoid phenergan- compazine and  zofran preferred -SCDs should help  Deconditioning -PT, OT -HH per PT  Wife updated during rounds. Stable to transfer to the floor. TRH to assume care tomorrow.   Best Practice (right click and "Reselect all SmartList Selections" daily)   Diet/type: Regular consistency (see orders) DVT prophylaxis: prophylactic heparin  GI prophylaxis: PPI Lines: N/A Foley:  N/A Code Status:  full code Last date of multidisciplinary goals of care discussion [wife updated at bedside.]  Labs   CBC: Recent Labs  Lab 01/29/22 2011 01/30/22 0129 01/31/22 0518 01/31/22 1102 02/01/22 0212  WBC 7.9 20.1* 14.2*  --  10.5  NEUTROABS 7.5  --   --   --   --   HGB 9.0* 8.4* 7.7*  --  7.6*  HCT 27.0* 26.1* 24.3*  --  23.7*  MCV 89.4 90.6 91.7  --  90.1  PLT 249 237 188 167 137*     Basic Metabolic Panel: Recent Labs  Lab 01/29/22 2011 01/30/22 0129 01/31/22 0518 02/01/22 0212  NA 135 135 133* 133*  K 3.6 4.0 4.9 4.4  CL 105 103 100 106  CO2 19* 18* 18* 20*  GLUCOSE 121* 105* 92 107*  BUN 26* 26* 44* 58*  CREATININE 1.30* 1.37* 2.01* 1.80*  CALCIUM 8.3* 8.3* 7.7* 7.2*  MG  --  1.7 2.3  --   PHOS  --  2.9 4.5  --     GFR: Estimated Creatinine Clearance: 26.8 mL/min (A) (by C-G formula based on SCr of 1.8 mg/dL (H)). Recent Labs  Lab 01/29/22 2011 01/29/22 2217 01/30/22 0129 01/30/22 2033 01/31/22 0518 02/01/22 0212  PROCALCITON  --   --  46.95  --   --   --   WBC 7.9  --  20.1*  --  14.2* 10.5  LATICACIDVEN 2.5* 2.2*  --  2.7* 2.7* 0.9       Julian Hy, DO 02/01/22 12:29 PM [facility]  For contact information, see Amion. If no response to pager, please call PCCM consult pager. After hours, 7PM- 7AM, please call Elink.

## 2022-02-01 NOTE — Evaluation (Signed)
Occupational Therapy Evaluation Patient Details Name: Erik [name]. MRN: [id number] DOB: Dec 26, 1940 Today's Date: 02/01/2022   History of Present Illness 81 yo male admitted 11/17 with abdominal pain, hypotension and shock requiring pressor support. Possible partial SBO. PMhx: ICM, CAD, HFrEF, COPD, GERD, HTN   Clinical Impression   This 81 yo male admitted with above presents to acute OT with PLOF of being totally independent with all basic ADLs, driving, and working in his wood shop. Currently he is setup-min A for basic ADLs and will continue to benefit from acute OT without need for follow up.       Recommendations for follow up therapy are one component of a multi-disciplinary discharge planning process, led by the attending physician.  Recommendations may be updated based on patient status, additional functional criteria and insurance authorization.   Follow Up Recommendations  No OT follow up     Assistance Recommended at Discharge Frequent or constant Supervision/Assistance  Patient can return home with the following A little help with walking and/or transfers;A little help with bathing/dressing/bathroom;Assistance with cooking/housework;Assist for transportation;Help with stairs or ramp for entrance    Functional Status Assessment  Patient has had a recent decline in their functional status and demonstrates the ability to make significant improvements in function in a reasonable and predictable amount of time.  Equipment Recommendations  None recommended by OT       Precautions / Restrictions Precautions Precautions: Fall Restrictions Weight Bearing Restrictions: No      Mobility Bed Mobility               General bed mobility comments: pt up in recliner    Transfers Overall transfer level: Needs assistance Equipment used: 1 person hand held assist Transfers: Sit to/from Stand Sit to Stand: Min assist           General transfer comment: pt with  posterior bias on standing with initial sit>stand with LOB backwards (he sat back down in recliner)      Balance Overall balance assessment: Needs assistance Sitting-balance support: No upper extremity supported, Feet supported Sitting balance-Leahy Scale: Fair     Standing balance support: Single extremity supported Standing balance-Leahy Scale: Poor Standing balance comment: reliant on single UE support in standing                           ADL either performed or assessed with clinical judgement   ADL Overall ADL's : Needs assistance/impaired Eating/Feeding: Independent;Sitting   Grooming: Set up;Sitting   Upper Body Bathing: Set up;Sitting   Lower Body Bathing: Minimal assistance;Sit to/from stand   Upper Body Dressing : Set up;Sitting   Lower Body Dressing: Minimal assistance;Sit to/from stand   Toilet Transfer: Minimal assistance;Ambulation;Rolling walker (2 wheels) Toilet Transfer Details (indicate cue type and reason): simulated back and forth from recliner 10 feet x2 Toileting- Clothing Manipulation and Hygiene: Minimal assistance;Sit to/from stand         General ADL Comments: talked with pt and wife about energy conservation strategies     Vision Patient Visual Report: No change from baseline              Pertinent Vitals/Pain Pain Assessment Pain Assessment: Faces Faces Pain Scale: Hurts little more Pain Location: sore all over Pain Descriptors / Indicators: Sore Pain Intervention(s): Limited activity within patient's tolerance, Monitored during session, Repositioned     Hand Dominance Right   Extremity/Trunk Assessment Upper Extremity Assessment Upper Extremity Assessment:  Overall WFL for tasks assessed           Communication Communication Communication: HOH   Cognition Arousal/Alertness: Awake/alert Behavior During Therapy: Flat affect Overall Cognitive Status: Within Functional Limits for tasks assessed                                                   Home Living Family/patient expects to be discharged to:: Private residence Living Arrangements: Spouse/significant other Available Help at Discharge: Family;Available 24 hours/day Type of Home: House Home Access: Ramped entrance     Home Layout: One level     Bathroom Shower/Tub: Occupational psychologist: Handicapped height     Home Equipment: Conservation officer, nature (2 wheels);Cane - single point;Grab bars - toilet;Grab bars - tub/shower;Hand held shower head;Shower seat   Additional Comments: pt uses ramp at front of house and 8 stairs on back to get to wood shop      Prior Functioning/Environment Prior Level of Function : Independent/Modified Independent;Driving                        OT Problem List: Impaired balance (sitting and/or standing);Pain      OT Treatment/Interventions: Self-care/ADL training;DME and/or AE instruction;Balance training;Patient/family education    OT Goals(Current goals can be found in the care plan section) Acute Rehab OT Goals Patient Stated Goal: to get back to his wood shop OT Goal Formulation: With patient Time For Goal Achievement: 02/15/22 Potential to Achieve Goals: Good  OT Frequency: Min 2X/week       AM-PAC OT "6 Clicks" Daily Activity     Outcome Measure Help from another person eating meals?: None Help from another person taking care of personal grooming?: A Little Help from another person toileting, which includes using toliet, bedpan, or urinal?: A Little Help from another person bathing (including washing, rinsing, drying)?: A Little Help from another person to put on and taking off regular upper body clothing?: A Little Help from another person to put on and taking off regular lower body clothing?: A Little 6 Click Score: 19   End of Session    Activity Tolerance: Patient tolerated treatment well Patient left: in chair;with call bell/phone within reach;with  family/visitor present  OT Visit Diagnosis: Unsteadiness on feet (R26.81);Other abnormalities of gait and mobility (R26.89);Pain;Muscle weakness (generalized) (M62.81) Pain - part of body:  (all over)                Time: 7342-8768 OT Time Calculation (min): 15 min Charges:  OT General Charges $OT Visit: 1 Visit OT Evaluation $OT Eval Moderate Complexity: [place], OTR/L Acute [employer] [facility] [phone] Office [phone]    Almon Register 02/01/2022, 2:30 PM

## 2022-02-01 NOTE — Evaluation (Signed)
Physical Therapy Evaluation Patient Details Name: Erik [name]. MRN: [id number] DOB: [birth year] Today's Date: 02/01/2022  History of Present Illness  81 yo male admitted 11/17 with abdominal pain, hypotension and shock requiring pressor support. Possible partial SBO. PMhx: ICM, CAD, HFrEF, COPD, GERD, HTN  Clinical Impression  Pt with flat affect, decreased balance, gait and standing with reliance on UE support for upright activity today. Pt normally very independent, driving, working in his wood shop and going up and down stairs. Pt with decreased functional mobility and balance who will benefit from acute therapy to maximize mobility and safety to return to PLOF. Encouraged gait daily and OOB for meals and toileting with nursing staff.   SpO2 92-97% on RA HR 82-84 BP 127/60       Recommendations for follow up therapy are one component of a multi-disciplinary discharge planning process, led by the attending physician.  Recommendations may be updated based on patient status, additional functional criteria and insurance authorization.  Follow Up Recommendations Home health PT      Assistance Recommended at Discharge Intermittent Supervision/Assistance  Patient can return home with the following  A lot of help with bathing/dressing/bathroom;Assistance with cooking/housework;Help with stairs or ramp for entrance    Equipment Recommendations None recommended by PT  Recommendations for Other Services       Functional Status Assessment Patient has had a recent decline in their functional status and demonstrates the ability to make significant improvements in function in a reasonable and predictable amount of time.     Precautions / Restrictions Precautions Precautions: Fall Restrictions Weight Bearing Restrictions: No      Mobility  Bed Mobility Overal bed mobility: Modified Independent             General bed mobility comments: HOB 20 degrees increased time, pt able  to don socks in bed    Transfers Overall transfer level: Needs assistance   Transfers: Sit to/from Stand Sit to Stand: Min guard           General transfer comment: pt with posterior bias on standing and need for single UE support to rise    Ambulation/Gait Ambulation/Gait assistance: Min guard Gait Distance (Feet): 150 Feet Assistive device: IV Pole Gait Pattern/deviations: Step-through pattern, Decreased stride length   Gait velocity interpretation: 1.31 - 2.62 ft/sec, indicative of limited community ambulator   General Gait Details: pt with reliance of single and bil UE support on IV pole, decreased speed with guarding due to posterior bias in standing. Encouraged RW use for future trials  Stairs            Wheelchair Mobility    Modified Rankin (Stroke Patients Only)       Balance Overall balance assessment: Needs assistance   Sitting balance-Leahy Scale: Fair     Standing balance support: Single extremity supported Standing balance-Leahy Scale: Poor Standing balance comment: reliant on single UE support in standing                             Pertinent Vitals/Pain Pain Assessment Pain Assessment: 0-10 Pain Score: 3  Pain Location: abdomen Pain Descriptors / Indicators: Sore Pain Intervention(s): Limited activity within patient's tolerance, Monitored during session    Home Living Family/patient expects to be discharged to:: Private residence Living Arrangements: Spouse/significant other Available Help at Discharge: Family;Available 24 hours/day Type of Home: House Home Access: Ramped entrance       Home Layout: One  level Home Equipment: Conservation officer, nature (2 wheels);Cane - single point Additional Comments: pt uses ramp at front of house and 8 stairs on back to get to wood shop    Prior Function Prior Level of Function : Independent/Modified Independent;Driving                     Hand Dominance        Extremity/Trunk  Assessment   Upper Extremity Assessment Upper Extremity Assessment: Generalized weakness    Lower Extremity Assessment Lower Extremity Assessment: Generalized weakness    Cervical / Trunk Assessment Cervical / Trunk Assessment: Normal  Communication   Communication: HOH  Cognition Arousal/Alertness: Awake/alert Behavior During Therapy: Flat affect Overall Cognitive Status: Within Functional Limits for tasks assessed                                          General Comments      Exercises     Assessment/Plan    PT Assessment Patient needs continued PT services  PT Problem List Decreased strength;Decreased mobility;Decreased activity tolerance;Decreased balance;Decreased knowledge of use of DME       PT Treatment Interventions Gait training;Therapeutic exercise;Patient/family education;Stair training;Functional mobility training;Balance training;DME instruction;Therapeutic activities    PT Goals (Current goals can be found in the Care Plan section)  Acute Rehab PT Goals Patient Stated Goal: return home and to my shop PT Goal Formulation: With patient/family Time For Goal Achievement: 02/15/22 Potential to Achieve Goals: Good    Frequency Min 3X/week     Co-evaluation               AM-PAC PT "6 Clicks" Mobility  Outcome Measure Help needed turning from your back to your side while in a flat bed without using bedrails?: None Help needed moving from lying on your back to sitting on the side of a flat bed without using bedrails?: None Help needed moving to and from a bed to a chair (including a wheelchair)?: A Little Help needed standing up from a chair using your arms (e.g., wheelchair or bedside chair)?: A Little Help needed to walk in hospital room?: A Little Help needed climbing 3-5 steps with a railing? : A Little 6 Click Score: 20    End of Session   Activity Tolerance: Patient tolerated treatment well Patient left: in chair;with call  bell/phone within reach;with chair alarm set;with family/visitor present Nurse Communication: Mobility status PT Visit Diagnosis: Other abnormalities of gait and mobility (R26.89);Muscle weakness (generalized) (M62.81)    Time: 0569-7948 PT Time Calculation (min) (ACUTE ONLY): 22 min   Charges:   PT Evaluation $PT Eval Moderate Complexity: 1 Mod          [place], PT Acute Rehabilitation Services Office: [phone]   Sandy Salaam Ardon Franklin 02/01/2022, 7:59 AM

## 2022-02-02 ENCOUNTER — Inpatient Hospital Stay (HOSPITAL_COMMUNITY): Payer: Medicare Other

## 2022-02-02 DIAGNOSIS — R579 Shock, unspecified: Secondary | ICD-10-CM | POA: Diagnosis not present

## 2022-02-02 LAB — HEPATITIS PANEL, ACUTE
HCV Ab: NONREACTIVE
Hep A IgM: NONREACTIVE
Hep B C IgM: NONREACTIVE
Hepatitis B Surface Ag: NONREACTIVE

## 2022-02-02 LAB — COMPREHENSIVE METABOLIC PANEL
ALT: 387 U/L — ABNORMAL HIGH (ref 0–44)
AST: 688 U/L — ABNORMAL HIGH (ref 15–41)
Albumin: 2 g/dL — ABNORMAL LOW (ref 3.5–5.0)
Alkaline Phosphatase: 86 U/L (ref 38–126)
Anion gap: 8 (ref 5–15)
BUN: 56 mg/dL — ABNORMAL HIGH (ref 8–23)
CO2: 23 mmol/L (ref 22–32)
Calcium: 7.6 mg/dL — ABNORMAL LOW (ref 8.9–10.3)
Chloride: 104 mmol/L (ref 98–111)
Creatinine, Ser: 1.33 mg/dL — ABNORMAL HIGH (ref 0.61–1.24)
GFR, Estimated: 54 mL/min — ABNORMAL LOW (ref >60)
Glucose, Bld: 121 mg/dL — ABNORMAL HIGH (ref 70–99)
Potassium: 3.8 mmol/L (ref 3.5–5.1)
Sodium: 135 mmol/L (ref 135–145)
Total Bilirubin: 0.5 mg/dL (ref 0.3–1.2)
Total Protein: 5.4 g/dL — ABNORMAL LOW (ref 6.5–8.1)

## 2022-02-02 LAB — CBC WITH DIFFERENTIAL/PLATELET
Abs Immature Granulocytes: 0.02 10*3/uL (ref 0.00–0.07)
Basophils Absolute: 0 10*3/uL (ref 0.0–0.1)
Basophils Relative: 0 %
Eosinophils Absolute: 0.3 10*3/uL (ref 0.0–0.5)
Eosinophils Relative: 3 %
HCT: 23.1 % — ABNORMAL LOW (ref 39.0–52.0)
Hemoglobin: 7.7 g/dL — ABNORMAL LOW (ref 13.0–17.0)
Immature Granulocytes: 0 %
Lymphocytes Relative: 7 %
Lymphs Abs: 0.6 10*3/uL — ABNORMAL LOW (ref 0.7–4.0)
MCH: 28.8 pg (ref 26.0–34.0)
MCHC: 33.3 g/dL (ref 30.0–36.0)
MCV: 86.5 fL (ref 80.0–100.0)
Monocytes Absolute: 0.9 10*3/uL (ref 0.1–1.0)
Monocytes Relative: 10 %
Neutro Abs: 7.3 10*3/uL (ref 1.7–7.7)
Neutrophils Relative %: 80 %
Platelets: 110 10*3/uL — ABNORMAL LOW (ref 150–400)
RBC: 2.67 MIL/uL — ABNORMAL LOW (ref 4.22–5.81)
RDW: 13.5 % (ref 11.5–15.5)
WBC: 9.2 10*3/uL (ref 4.0–10.5)
nRBC: 0 % (ref 0.0–0.2)

## 2022-02-02 LAB — PROTIME-INR
INR: 1.3 — ABNORMAL HIGH (ref 0.8–1.2)
Prothrombin Time: 16.3 seconds — ABNORMAL HIGH (ref 11.4–15.2)

## 2022-02-02 MED ORDER — CEFAZOLIN SODIUM-DEXTROSE 2-4 GM/100ML-% IV SOLN
2.0000 g | Freq: Three times a day (TID) | INTRAVENOUS | Status: DC
  Administered 2022-02-02 – 2022-02-03 (×4): 2 g via INTRAVENOUS
  Filled 2022-02-02 (×4): qty 100

## 2022-02-02 NOTE — Progress Notes (Signed)
Physical Therapy Treatment Patient Details Name: Erik [name]. MRN: [id number] DOB: 07-31-1940 Today's Date: 02/02/2022   History of Present Illness 81 yo male admitted 11/17 with abdominal pain, hypotension and shock requiring pressor support. Possible partial SBO. PMhx: ICM, CAD, HFrEF, COPD, GERD, HTN    PT Comments    Patient moving slowly and required use of RW for stability when walking x 180 ft with minguard assist. Very flat affect. Anticipate progression to using no device prior to discharge.    Recommendations for follow up therapy are one component of a multi-disciplinary discharge planning process, led by the attending physician.  Recommendations may be updated based on patient status, additional functional criteria and insurance authorization.  Follow Up Recommendations  Home health PT     Assistance Recommended at Discharge Intermittent Supervision/Assistance  Patient can return home with the following A lot of help with bathing/dressing/bathroom;Assistance with cooking/housework;Help with stairs or ramp for entrance   Equipment Recommendations  None recommended by PT    Recommendations for Other Services       Precautions / Restrictions Precautions Precautions: Fall Restrictions Weight Bearing Restrictions: No     Mobility  Bed Mobility Overal bed mobility: Modified Independent             General bed mobility comments: incr time with HOB slightly elevated    Transfers Overall transfer level: Needs assistance Equipment used: Rolling walker (2 wheels) Transfers: Sit to/from Stand Sit to Stand: Min guard           General transfer comment: vc for proper hand placement with RW; x 2 reps    Ambulation/Gait Ambulation/Gait assistance: Min guard Gait Distance (Feet): 180 Feet Assistive device: Rolling walker (2 wheels) Gait Pattern/deviations: Step-through pattern, Decreased stride length, Trunk flexed   Gait velocity interpretation:  1.31 - 2.62 ft/sec, indicative of limited community ambulator   General Gait Details: vc for upright posture and proximity to RW; steady with use of RW   Stairs             Wheelchair Mobility    Modified Rankin (Stroke Patients Only)       Balance Overall balance assessment: Needs assistance Sitting-balance support: No upper extremity supported, Feet supported Sitting balance-Leahy Scale: Fair     Standing balance support: Single extremity supported Standing balance-Leahy Scale: Poor Standing balance comment: reliant on UE support in standing                            Cognition Arousal/Alertness: Awake/alert Behavior During Therapy: Flat affect Overall Cognitive Status: Within Functional Limits for tasks assessed                                          Exercises General Exercises - Lower Extremity Ankle Circles/Pumps: AROM, Both, 10 reps Heel Slides: AROM, Both, 5 reps, Supine    General Comments General comments (skin integrity, edema, etc.): Wife present and encouraging      Pertinent Vitals/Pain Pain Assessment Pain Assessment: No/denies pain    Home Living                          Prior Function            PT Goals (current goals can now be found in the care plan section) Acute Rehab  PT Goals Patient Stated Goal: return home and to my shop Time For Goal Achievement: 02/15/22 Potential to Achieve Goals: Good Progress towards PT goals: Progressing toward goals    Frequency    Min 3X/week      PT Plan Current plan remains appropriate    Co-evaluation              AM-PAC PT "6 Clicks" Mobility   Outcome Measure  Help needed turning from your back to your side while in a flat bed without using bedrails?: None Help needed moving from lying on your back to sitting on the side of a flat bed without using bedrails?: None Help needed moving to and from a bed to a chair (including a wheelchair)?:  A Little Help needed standing up from a chair using your arms (e.g., wheelchair or bedside chair)?: A Little Help needed to walk in hospital room?: A Little Help needed climbing 3-5 steps with a railing? : A Little 6 Click Score: 20    End of Session Equipment Utilized During Treatment: Gait belt Activity Tolerance: Patient tolerated treatment well Patient left: in chair;with call bell/phone within reach;with chair alarm set;with family/visitor present Nurse Communication: Mobility status PT Visit Diagnosis: Other abnormalities of gait and mobility (R26.89);Muscle weakness (generalized) (M62.81)     Time: 9191-6606 PT Time Calculation (min) (ACUTE ONLY): 24 min  Charges:  $Gait Training: 23-37 mins                      Arby Barrette, [place]  Office [phone]    Rexanne Mano 02/02/2022, 4:05 PM

## 2022-02-02 NOTE — Plan of Care (Signed)

## 2022-02-02 NOTE — Progress Notes (Signed)
    [facility] has been requested to perform a transesophageal echocardiogram on 11/22 for MSSA bacteremia.  After careful review of history and examination, the risks and benefits of transesophageal echocardiogram have been explained including risks of esophageal damage, perforation (1:10,000 risk), bleeding, pharyngeal hematoma as well as other potential complications associated with conscious sedation including aspiration, arrhythmia, respiratory failure and death. Alternatives to treatment were discussed, questions were answered. Patient is willing to proceed.   Lily Kocher PA-C 02/02/2022 2:08 PM

## 2022-02-02 NOTE — Care Management Important Message (Signed)
Important Message  Patient Details  Name: Erik [name]. MRN: [id number] Date of Birth: Feb 06, 1941   Medicare Important Message Given:  Yes     [name] 02/02/2022, 2:13 PM

## 2022-02-02 NOTE — Progress Notes (Signed)
PROGRESS NOTE                                                                                                                                                                                                             Patient Demographics:    Erik [name], is a 81 y.o. male, DOB - [birth year], IRW:[id number]  Outpatient Primary MD for the patient is [name], MD    LOS - 3  Admit date - 01/29/2022    Chief Complaint  Patient presents with   Fever   Shortness of Breath       Brief Narrative (HPI from H&P)   80 year old male with past medical history as below, which is significant for ischemic cardiomyopathy, CAD status post PCI, heart failure reduced ejection fraction, COPD, and aortic aneurysm.  He was in his usual state of health until 11/17 when he developed chills, cough, and mild abdominal pain.  Denies productive cough, nausea, vomiting, diarrhea.  No other complaints.  Upon arrival to the emergency department he was noted to be hypotensive refractory to IV fluid resuscitation and was started on norepinephrine infusion.    Work-up included chest x-ray which was clear and CT abdomen and pelvis which was concerning for possible early small bowel obstruction/Ileus.  Laboratory evaluation was significant for serum creatinine of 1.3, lactic acid 2.5, and hemoglobin 9.  PCCM was requested to admit to ICU for vasopressor management, he was stabilized and transferred to hospitalist service on 02/02/2022 on days 3 of hospital stay.  He is currently off of pressors however his LFTs are high, he is awaiting TEE tomorrow.   Subjective:    Erik [name] today has, No headache, No chest pain, No abdominal pain - No Nausea, No new weakness tingling or numbness, no SOB.   Assessment  & Plan :   Septic shock due to MSSA bacteremia- unclear initial source/ portal of entry - off NE, Appreciate ID's management, TEE on 11/22, con't  cefazolin, he is now off of pressors and septic shock seems to have resolved.  Continue to monitor closely.   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  TEE due  02/03/2022.   Asymptomatic transaminitis.  Likely due to shock liver, acute hepatitis panel and right upper quadrant ultrasound ordered.  Continue to monitor trend.  Hold statin.  AKI with lactic acidosis.  Due to septic shock.  Improved.  Continue to monitor.   Coagulopathy 2/2 sepsis, present on admission -vit K  Anemia, acute on chronic- currently stable.  DIC ruled out in ICU monitor closely.   Hyponatremia -cont to monitor   RLS -con't PTA ropinirole, avoid phenergan- compazine and zofran preferred, SCDs should help   Deconditioning, PT, OT   Questionable ileus on x-ray done in ICU on 02/01/2022.  Currently symptom-free.  Clear liquid diet and monitor.  Will advance as tolerated.       Condition - Extremely Guarded  Family Communication  :  son sleepy bedside 02/02/22  Code Status :  Full  Consults  :  PCCM, ID, Cards  PUD Prophylaxis : Pepcid   Procedures  :            Disposition Plan  :    Status is: Inpatient  DVT Prophylaxis  :    heparin injection 5,000 Units Start: 02/01/22 1400 Place and maintain sequential compression device Start: 01/31/22 1023    Lab Results  Component Value Date   PLT 110 (L) 02/02/2022    Diet :  Diet Order             Diet NPO time specified Except for: Sips with Meds  Diet effective midnight           Diet clear liquid Room service appropriate? Yes; Fluid consistency: Thin  Diet effective now                    Inpatient Medications  Scheduled Meds:  aspirin  81 mg Oral Daily   Chlorhexidine Gluconate Cloth  6 each Topical Daily   clopidogrel  75 mg Oral Daily    ezetimibe  10 mg Oral Daily   famotidine  20 mg Oral QHS   feeding supplement  237 mL Oral TID BM   heparin injection (subcutaneous)  5,000 Units Subcutaneous Q8H   mirtazapine  15 mg Oral QHS   multivitamin with minerals  1 tablet Oral Daily   rOPINIRole  1.5 mg Oral Daily   rOPINIRole  3 mg Oral QHS   tamsulosin  0.4 mg Oral Daily   Continuous Infusions:  sodium chloride 250 mL (02/01/22 2251)    ceFAZolin (ANCEF) IV 2 g (02/02/22 0526)   PRN Meds:.fentaNYL (SUBLIMAZE) injection, ondansetron (ZOFRAN) IV, mouth rinse, prochlorperazine    Objective:   Vitals:   02/01/22 2230 02/02/22 0341 02/02/22 0500 02/02/22 0800  BP: (!) 122/54 (!) 111/59  (!) 106/58  Pulse: 83 78  76  Resp: '14 20  17  '$ Temp: 98.1 F (36.7 C) 98 F (36.7 C)  98.1 F (36.7 C)  TempSrc: Oral Oral  Oral  SpO2: 91% 93%  91%  Weight:   56.7 kg   Height:        Wt Readings from Last 3 Encounters:  02/02/22 56.7 kg  11/27/21 52.3 kg  11/03/21 52.8 kg     Intake/Output Summary (Last 24 hours) at 02/02/2022 0936 Last data filed at 02/02/2022 0342 Gross per 24 hour  Intake 550 ml  Output 200 ml  Net 350 ml     Physical Exam  Awake Alert, No new F.N deficits, Normal affect [place].AT,PERRAL Supple Neck, No JVD,   Symmetrical Chest wall  movement, Good air movement bilaterally, CTAB RRR,No Gallops,Rubs or new Murmurs,  +ve B.Sounds, Abd Soft, No tenderness,   No Cyanosis, Clubbing or edema       Data Review:    Recent Labs  Lab 01/29/22 2011 01/30/22 0129 01/31/22 0518 01/31/22 1102 02/01/22 0212 02/02/22 0316  WBC 7.9 20.1* 14.2*  --  10.5 9.2  HGB 9.0* 8.4* 7.7*  --  7.6* 7.7*  HCT 27.0* 26.1* 24.3*  --  23.7* 23.1*  PLT 249 237 188 167 137* 110*  MCV 89.4 90.6 91.7  --  90.1 86.5  MCH 29.8 29.2 29.1  --  28.9 28.8  MCHC 33.3 32.2 31.7  --  32.1 33.3  RDW 13.8 13.9 13.9  --  13.7 13.5  LYMPHSABS 0.3*  --   --   --   --  0.6*  MONOABS 0.1  --   --   --   --  0.9  EOSABS 0.0  --   --    --   --  0.3  BASOSABS 0.0  --   --   --   --  0.0    Recent Labs  Lab 01/29/22 2011 01/29/22 2217 01/30/22 0129 01/30/22 2033 01/31/22 0518 01/31/22 1102 02/01/22 0212 02/02/22 0316 02/02/22 0616  NA 135  --  135  --  133*  --  133* 135  --   K 3.6  --  4.0  --  4.9  --  4.4 3.8  --   CL 105  --  103  --  100  --  106 104  --   CO2 19*  --  18*  --  18*  --  20* 23  --   GLUCOSE 121*  --  105*  --  92  --  107* 121*  --   BUN 26*  --  26*  --  44*  --  58* 56*  --   CREATININE 1.30*  --  1.37*  --  2.01*  --  1.80* 1.33*  --   AST 16  --   --   --   --   --   --  688*  --   ALT 9  --   --   --   --   --   --  387*  --   ALKPHOS 52  --   --   --   --   --   --  86  --   BILITOT 0.5  --   --   --   --   --   --  0.5  --   ALBUMIN 2.6*  --   --   --   --   --   --  2.0*  --   DDIMER  --   --   --   --   --  >20.00*  --   --   --   PROCALCITON  --   --  46.95  --   --   --   --   --   --   LATICACIDVEN 2.5* 2.2*  --  2.7* 2.7*  --  0.9  --   --   INR 1.4*  --   --   --   --  2.2*  --   --  1.3*  MG  --   --  1.7  --  2.3  --   --   --   --  CALCIUM 8.3*  --  8.3*  --  7.7*  --  7.2* 7.6*  --    Radiology Reports DG Abd 1 View  Result Date: 02/01/2022 CLINICAL DATA:  [id number] Abdominal pain [id number] EXAM: ABDOMEN - 1 VIEW COMPARISON:  X-ray abdomen 01/30/2022, CT abdomen pelvis 01/29/2022 FINDINGS: Interval development of gaseous dilatation of the colon. No gaseous dilatation of the small bowel. No radio-opaque calculi or other significant radiographic abnormality are seen. Aorta to bilateral iliac repair noted. IMPRESSION: Interval development of gaseous dilatation of the colon. Consider CT with intravenous contrast for further evaluation. Electronically Signed   By: Iven Finn M.D.   On: 02/01/2022 17:44   ECHOCARDIOGRAM COMPLETE  Result Date: 01/30/2022    ECHOCARDIOGRAM REPORT   Patient Name:   Erik [name]. Date of Exam: 01/30/2022 Medical Rec #:  [id number]              Height:       66.0 in Accession #:    [id number]            Weight:       121.3 lb Date of Birth:  [birth year]              BSA:          1.617 m Patient Age:    33 years              BP:           104/57 mmHg Patient Gender: M                     HR:           84 bpm. Exam Location:  Inpatient Procedure: 2D Echo Indications:    cardiomyopathy-ischemic  History:        Patient has prior history of Echocardiogram examinations, most                 recent 07/21/2020. Cardiomyopathy, CAD, COPD,                 Signs/Symptoms:Murmur; Risk Factors:Dyslipidemia.  Sonographer:    Harvie Junior Referring Phys: [phone] Silvestre Moment [facility]  Sonographer Comments: Technically difficult study due to poor echo windows. IMPRESSIONS  1. Left ventricular ejection fraction, by estimation, is 50 to 55%. The left ventricle has low normal function. The left ventricle has no regional wall motion abnormalities. There is mild left ventricular hypertrophy. Left ventricular diastolic function  could not be evaluated.  2. Right ventricular systolic function is normal. The right ventricular size is normal. There is moderately elevated pulmonary artery systolic pressure.  3. Left atrial size was moderately dilated.  4. The mitral valve is normal in structure. Mild to moderate mitral valve regurgitation. Mild mitral stenosis. Severe mitral annular calcification.  5. The aortic valve has an indeterminant number of cusps. Aortic valve regurgitation is mild. Aortic valve sclerosis/calcification is present, without any evidence of aortic stenosis.  6. The inferior vena cava is dilated in size with <50% respiratory variability, suggesting right atrial pressure of 15 mmHg. FINDINGS  Left Ventricle: Left ventricular ejection fraction, by estimation, is 50 to 55%. The left ventricle has low normal function. The left ventricle has no regional wall motion abnormalities. The left ventricular internal cavity size was normal in size. There is mild left  ventricular hypertrophy. Left ventricular diastolic function could not be evaluated due to mitral annular calcification (moderate or greater). Left ventricular diastolic function could not be  evaluated. Right Ventricle: The right ventricular size is normal. Right ventricular systolic function is normal. There is moderately elevated pulmonary artery systolic pressure. The tricuspid regurgitant velocity is 2.76 m/s, and with an assumed right atrial pressure of 15 mmHg, the estimated right ventricular systolic pressure is 89.3 mmHg. Left Atrium: Left atrial size was moderately dilated. Right Atrium: Right atrial size was normal in size. Pericardium: There is no evidence of pericardial effusion. Mitral Valve: The mitral valve is normal in structure. Severe mitral annular calcification. Mild to moderate mitral valve regurgitation. Mild mitral valve stenosis. MV peak gradient, 15.5 mmHg. The mean mitral valve gradient is 5.3 mmHg. Tricuspid Valve: The tricuspid valve is normal in structure. Tricuspid valve regurgitation is trivial. No evidence of tricuspid stenosis. Aortic Valve: The aortic valve has an indeterminant number of cusps. Aortic valve regurgitation is mild. Aortic regurgitation PHT measures 342 msec. Aortic valve sclerosis/calcification is present, without any evidence of aortic stenosis. Aortic valve mean gradient measures 7.0 mmHg. Aortic valve peak gradient measures 14.3 mmHg. Aortic valve area, by VTI measures 1.97 cm. Pulmonic Valve: The pulmonic valve was not well visualized. Pulmonic valve regurgitation is not visualized. No evidence of pulmonic stenosis. Aorta: The aortic root is normal in size and structure. Venous: The inferior vena cava is dilated in size with less than 50% respiratory variability, suggesting right atrial pressure of 15 mmHg. IAS/Shunts: No atrial level shunt detected by color flow Doppler.  LEFT VENTRICLE PLAX 2D LVIDd:         4.50 cm      Diastology LVIDs:         3.30 cm       LV e' medial:    6.20 cm/s LV PW:         0.90 cm      LV E/e' medial:  25.0 LV IVS:        0.70 cm      LV e' lateral:   4.68 cm/s LVOT diam:     1.90 cm      LV E/e' lateral: 33.1 LV SV:         62 LV SV Index:   38 LVOT Area:     2.84 cm  LV Volumes (MOD) LV vol d, MOD A2C: 107.0 ml LV vol d, MOD A4C: 123.0 ml LV vol s, MOD A2C: 57.4 ml LV vol s, MOD A4C: 58.5 ml LV SV MOD A2C:     49.6 ml LV SV MOD A4C:     123.0 ml LV SV MOD BP:      59.3 ml RIGHT VENTRICLE RV Basal diam:  4.10 cm RV Mid diam:    3.30 cm RV S prime:     14.00 cm/s TAPSE (M-mode): 2.5 cm LEFT ATRIUM             Index        RIGHT ATRIUM           Index LA diam:        3.40 cm 2.10 cm/m   RA Area:     16.50 cm LA Vol (A2C):   69.3 ml 42.87 ml/m  RA Volume:   47.40 ml  29.32 ml/m LA Vol (A4C):   71.5 ml 44.23 ml/m LA Biplane Vol: 71.0 ml 43.92 ml/m  AORTIC VALVE                     PULMONIC VALVE AV Area (Vmax):    1.83 cm  PV Vmax:       1.38 m/s AV Area (Vmean):   1.78 cm      PV Peak grad:  7.6 mmHg AV Area (VTI):     1.97 cm AV Vmax:           189.00 cm/s AV Vmean:          119.000 cm/s AV VTI:            0.312 m AV Peak Grad:      14.3 mmHg AV Mean Grad:      7.0 mmHg LVOT Vmax:         122.00 cm/s LVOT Vmean:        74.700 cm/s LVOT VTI:          0.217 m LVOT/AV VTI ratio: 0.70 AI PHT:            342 msec  AORTA Ao Root diam: 3.40 cm MITRAL VALVE                TRICUSPID VALVE MV Area (PHT): 4.52 cm     TR Peak grad:   30.5 mmHg MV Area VTI:   1.23 cm     TR Vmax:        276.00 cm/s MV Peak grad:  15.5 mmHg MV Mean grad:  5.3 mmHg     SHUNTS MV Vmax:       1.97 m/s     Systemic VTI:  0.22 m MV Vmean:      106.3 cm/s   Systemic Diam: 1.90 cm MV Decel Time: 168 msec MR Peak grad: 113.6 mmHg MR Vmax:      533.00 cm/s MV E velocity: 155.00 cm/s MV A velocity: 165.00 cm/s MV E/A ratio:  0.94 Kirk Ruths MD Electronically signed by Kirk Ruths MD Signature Date/Time: 01/30/2022/2:43:38 PM    Final    DG Abd Portable  1V  Result Date: 01/30/2022 CLINICAL DATA:  Small bowel obstruction. EXAM: PORTABLE ABDOMEN - 1 VIEW COMPARISON:  01/29/2022 CT and prior studies FINDINGS: A mildly distended loop of small bowel within the central abdomen is noted. No other gas-filled dilated bowel loops are present. Gas in the colon is present. Contrast in the bladder from recent CT noted. Aorta bi-iliac stent again identified. IMPRESSION: Mildly distended loop of small bowel within the central abdomen. No other dilated bowel loops identified on this examination. Electronically Signed   By: Margarette Canada M.D.   On: 01/30/2022 09:36   CT Abdomen Pelvis W Contrast  Result Date: 01/29/2022 CLINICAL DATA:  Sepsis, abdominal pain EXAM: CT ABDOMEN AND PELVIS WITH CONTRAST TECHNIQUE: Multidetector CT imaging of the abdomen and pelvis was performed using the standard protocol following bolus administration of intravenous contrast. RADIATION DOSE REDUCTION: This exam was performed according to the departmental dose-optimization program which includes automated exposure control, adjustment of the mA and/or kV according to patient size and/or use of iterative reconstruction technique. CONTRAST:  70m OMNIPAQUE IOHEXOL 350 MG/ML SOLN COMPARISON:  10/29/2021 FINDINGS: Lower chest: No acute abnormality. Coronary artery and aortic calcifications. Hepatobiliary: No focal hepatic abnormality. Gallbladder unremarkable. Pancreas: No focal abnormality or ductal dilatation. Spleen: No focal abnormality.  Normal size. Adrenals/Urinary Tract: No adrenal abnormality. No focal renal abnormality. No stones or hydronephrosis. Urinary bladder is unremarkable. Stomach/Bowel: Dilated small bowel loops noted in the abdomen and continuing into the pelvis. Distal small bowel loops are decompressed. Cannot exclude mild distal small bowel obstruction. 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 Maximum aneurysm sac size 4.5 cm compared to 4.6 cm  previously. No adenopathy Reproductive: Prostate enlargement. Other: No free fluid or free air. Musculoskeletal: No acute bony abnormality. IMPRESSION: Mildly dilated and fluid-filled small bowel loops continue to the pelvis. Distal small bowel is decompressed. Cannot exclude mild distal small bowel obstruction. Electronically Signed   By: Rolm Baptise M.D.   On: 01/29/2022 22:41   DG Chest Port 1 View  Result Date: 01/29/2022 CLINICAL DATA:  Fever, shortness of breath EXAM: PORTABLE CHEST 1 VIEW COMPARISON:  CT chest dated 06/16/2021 FINDINGS: Lungs are clear.  No pleural effusion or pneumothorax. The heart is normal in size. Aortic stent at the aortic hiatus. IMPRESSION: No evidence of acute cardiopulmonary disease. Electronically Signed   By: Julian Hy M.D.   On: 01/29/2022 20:48      Signature  Lala Lund M.D on 02/02/2022 at 9:36 AM   -  To page go to [email or web address]

## 2022-02-02 NOTE — H&P (View-Only) (Signed)
PROGRESS NOTE                                                                                                                                                                                                             Patient Demographics:    Erik [name], is a 81 y.o. male, DOB - [birth year], NLG:[id number]  Outpatient Primary MD for the patient is [name], MD    LOS - 3  Admit date - 01/29/2022    Chief Complaint  Patient presents with   Fever   Shortness of Breath       Brief Narrative (HPI from H&P)   81 year old male with past medical history as below, which is significant for ischemic cardiomyopathy, CAD status post PCI, heart failure reduced ejection fraction, COPD, and aortic aneurysm.  He was in his usual state of health until 11/17 when he developed chills, cough, and mild abdominal pain.  Denies productive cough, nausea, vomiting, diarrhea.  No other complaints.  Upon arrival to the emergency department he was noted to be hypotensive refractory to IV fluid resuscitation and was started on norepinephrine infusion.    Work-up included chest x-ray which was clear and CT abdomen and pelvis which was concerning for possible early small bowel obstruction/Ileus.  Laboratory evaluation was significant for serum creatinine of 1.3, lactic acid 2.5, and hemoglobin 9.  PCCM was requested to admit to ICU for vasopressor management, he was stabilized and transferred to hospitalist service on 02/02/2022 on days 3 of hospital stay.  He is currently off of pressors however his LFTs are high, he is awaiting TEE tomorrow.   Subjective:    [name] today has, No headache, No chest pain, No abdominal pain - No Nausea, No new weakness tingling or numbness, no SOB.   Assessment  & Plan :   Septic shock due to MSSA bacteremia- unclear initial source/ portal of entry - off NE, Appreciate ID's management, TEE on 11/22, con't  cefazolin, he is now off of pressors and septic shock seems to have resolved.  Continue to monitor closely.   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  TEE due  02/03/2022.   Asymptomatic transaminitis.  Likely due to shock liver, acute hepatitis panel and right upper quadrant ultrasound ordered.  Continue to monitor trend.  Hold statin.  AKI with lactic acidosis.  Due to septic shock.  Improved.  Continue to monitor.   Coagulopathy 2/2 sepsis, present on admission -vit K  Anemia, acute on chronic- currently stable.  DIC ruled out in ICU monitor closely.   Hyponatremia -cont to monitor   RLS -con't PTA ropinirole, avoid phenergan- compazine and zofran preferred, SCDs should help   Deconditioning, PT, OT   Questionable ileus on x-ray done in ICU on 02/01/2022.  Currently symptom-free.  Clear liquid diet and monitor.  Will advance as tolerated.       Condition - Extremely Guarded  Family Communication  :  son sleepy bedside 02/02/22  Code Status :  Full  Consults  :  PCCM, ID, Cards  PUD Prophylaxis : Pepcid   Procedures  :            Disposition Plan  :    Status is: Inpatient  DVT Prophylaxis  :    heparin injection 5,000 Units Start: 02/01/22 1400 Place and maintain sequential compression device Start: 01/31/22 1023    Lab Results  Component Value Date   PLT 110 (L) 02/02/2022    Diet :  Diet Order             Diet NPO time specified Except for: Sips with Meds  Diet effective midnight           Diet clear liquid Room service appropriate? Yes; Fluid consistency: Thin  Diet effective now                    Inpatient Medications  Scheduled Meds:  aspirin  81 mg Oral Daily   Chlorhexidine Gluconate Cloth  6 each Topical Daily   clopidogrel  75 mg Oral Daily    ezetimibe  10 mg Oral Daily   famotidine  20 mg Oral QHS   feeding supplement  237 mL Oral TID BM   heparin injection (subcutaneous)  5,000 Units Subcutaneous Q8H   mirtazapine  15 mg Oral QHS   multivitamin with minerals  1 tablet Oral Daily   rOPINIRole  1.5 mg Oral Daily   rOPINIRole  3 mg Oral QHS   tamsulosin  0.4 mg Oral Daily   Continuous Infusions:  sodium chloride 250 mL (02/01/22 2251)    ceFAZolin (ANCEF) IV 2 g (02/02/22 0526)   PRN Meds:.fentaNYL (SUBLIMAZE) injection, ondansetron (ZOFRAN) IV, mouth rinse, prochlorperazine    Objective:   Vitals:   02/01/22 2230 02/02/22 0341 02/02/22 0500 02/02/22 0800  BP: (!) 122/54 (!) 111/59  (!) 106/58  Pulse: 83 78  76  Resp: '14 20  17  '$ Temp: 98.1 F (36.7 C) 98 F (36.7 C)  98.1 F (36.7 C)  TempSrc: Oral Oral  Oral  SpO2: 91% 93%  91%  Weight:   56.7 kg   Height:        Wt Readings from Last 3 Encounters:  02/02/22 56.7 kg  11/27/21 52.3 kg  11/03/21 52.8 kg     Intake/Output Summary (Last 24 hours) at 02/02/2022 0936 Last data filed at 02/02/2022 0342 Gross per 24 hour  Intake 550 ml  Output 200 ml  Net 350 ml     Physical Exam  Awake Alert, No new F.N deficits, Normal affect [place].AT,PERRAL Supple Neck, No JVD,   Symmetrical Chest wall  movement, Good air movement bilaterally, CTAB RRR,No Gallops,Rubs or new Murmurs,  +ve B.Sounds, Abd Soft, No tenderness,   No Cyanosis, Clubbing or edema       Data Review:    Recent Labs  Lab 01/29/22 2011 01/30/22 0129 01/31/22 0518 01/31/22 1102 02/01/22 0212 02/02/22 0316  WBC 7.9 20.1* 14.2*  --  10.5 9.2  HGB 9.0* 8.4* 7.7*  --  7.6* 7.7*  HCT 27.0* 26.1* 24.3*  --  23.7* 23.1*  PLT 249 237 188 167 137* 110*  MCV 89.4 90.6 91.7  --  90.1 86.5  MCH 29.8 29.2 29.1  --  28.9 28.8  MCHC 33.3 32.2 31.7  --  32.1 33.3  RDW 13.8 13.9 13.9  --  13.7 13.5  LYMPHSABS 0.3*  --   --   --   --  0.6*  MONOABS 0.1  --   --   --   --  0.9  EOSABS 0.0  --   --    --   --  0.3  BASOSABS 0.0  --   --   --   --  0.0    Recent Labs  Lab 01/29/22 2011 01/29/22 2217 01/30/22 0129 01/30/22 2033 01/31/22 0518 01/31/22 1102 02/01/22 0212 02/02/22 0316 02/02/22 0616  NA 135  --  135  --  133*  --  133* 135  --   K 3.6  --  4.0  --  4.9  --  4.4 3.8  --   CL 105  --  103  --  100  --  106 104  --   CO2 19*  --  18*  --  18*  --  20* 23  --   GLUCOSE 121*  --  105*  --  92  --  107* 121*  --   BUN 26*  --  26*  --  44*  --  58* 56*  --   CREATININE 1.30*  --  1.37*  --  2.01*  --  1.80* 1.33*  --   AST 16  --   --   --   --   --   --  688*  --   ALT 9  --   --   --   --   --   --  387*  --   ALKPHOS 52  --   --   --   --   --   --  86  --   BILITOT 0.5  --   --   --   --   --   --  0.5  --   ALBUMIN 2.6*  --   --   --   --   --   --  2.0*  --   DDIMER  --   --   --   --   --  >20.00*  --   --   --   PROCALCITON  --   --  46.95  --   --   --   --   --   --   LATICACIDVEN 2.5* 2.2*  --  2.7* 2.7*  --  0.9  --   --   INR 1.4*  --   --   --   --  2.2*  --   --  1.3*  MG  --   --  1.7  --  2.3  --   --   --   --  CALCIUM 8.3*  --  8.3*  --  7.7*  --  7.2* 7.6*  --    Radiology Reports DG Abd 1 View  Result Date: 02/01/2022 CLINICAL DATA:  [id number] Abdominal pain [id number] EXAM: ABDOMEN - 1 VIEW COMPARISON:  X-ray abdomen 01/30/2022, CT abdomen pelvis 01/29/2022 FINDINGS: Interval development of gaseous dilatation of the colon. No gaseous dilatation of the small bowel. No radio-opaque calculi or other significant radiographic abnormality are seen. Aorta to bilateral iliac repair noted. IMPRESSION: Interval development of gaseous dilatation of the colon. Consider CT with intravenous contrast for further evaluation. Electronically Signed   By: Iven Finn M.D.   On: 02/01/2022 17:44   ECHOCARDIOGRAM COMPLETE  Result Date: 01/30/2022    ECHOCARDIOGRAM REPORT   Patient Name:   Erik [name]. Date of Exam: 01/30/2022 Medical Rec #:  [id number]              Height:       66.0 in Accession #:    [id number]            Weight:       121.3 lb Date of Birth:  18-Jul-1940              BSA:          1.617 m Patient Age:    69 years              BP:           104/57 mmHg Patient Gender: M                     HR:           84 bpm. Exam Location:  Inpatient Procedure: 2D Echo Indications:    cardiomyopathy-ischemic  History:        Patient has prior history of Echocardiogram examinations, most                 recent 07/21/2020. Cardiomyopathy, CAD, COPD,                 Signs/Symptoms:Murmur; Risk Factors:Dyslipidemia.  Sonographer:    Harvie Junior Referring Phys: [phone] Silvestre Moment [facility]  Sonographer Comments: Technically difficult study due to poor echo windows. IMPRESSIONS  1. Left ventricular ejection fraction, by estimation, is 50 to 55%. The left ventricle has low normal function. The left ventricle has no regional wall motion abnormalities. There is mild left ventricular hypertrophy. Left ventricular diastolic function  could not be evaluated.  2. Right ventricular systolic function is normal. The right ventricular size is normal. There is moderately elevated pulmonary artery systolic pressure.  3. Left atrial size was moderately dilated.  4. The mitral valve is normal in structure. Mild to moderate mitral valve regurgitation. Mild mitral stenosis. Severe mitral annular calcification.  5. The aortic valve has an indeterminant number of cusps. Aortic valve regurgitation is mild. Aortic valve sclerosis/calcification is present, without any evidence of aortic stenosis.  6. The inferior vena cava is dilated in size with <50% respiratory variability, suggesting right atrial pressure of 15 mmHg. FINDINGS  Left Ventricle: Left ventricular ejection fraction, by estimation, is 50 to 55%. The left ventricle has low normal function. The left ventricle has no regional wall motion abnormalities. The left ventricular internal cavity size was normal in size. There is mild left  ventricular hypertrophy. Left ventricular diastolic function could not be evaluated due to mitral annular calcification (moderate or greater). Left ventricular diastolic function could not be  evaluated. Right Ventricle: The right ventricular size is normal. Right ventricular systolic function is normal. There is moderately elevated pulmonary artery systolic pressure. The tricuspid regurgitant velocity is 2.76 m/s, and with an assumed right atrial pressure of 15 mmHg, the estimated right ventricular systolic pressure is 22.2 mmHg. Left Atrium: Left atrial size was moderately dilated. Right Atrium: Right atrial size was normal in size. Pericardium: There is no evidence of pericardial effusion. Mitral Valve: The mitral valve is normal in structure. Severe mitral annular calcification. Mild to moderate mitral valve regurgitation. Mild mitral valve stenosis. MV peak gradient, 15.5 mmHg. The mean mitral valve gradient is 5.3 mmHg. Tricuspid Valve: The tricuspid valve is normal in structure. Tricuspid valve regurgitation is trivial. No evidence of tricuspid stenosis. Aortic Valve: The aortic valve has an indeterminant number of cusps. Aortic valve regurgitation is mild. Aortic regurgitation PHT measures 342 msec. Aortic valve sclerosis/calcification is present, without any evidence of aortic stenosis. Aortic valve mean gradient measures 7.0 mmHg. Aortic valve peak gradient measures 14.3 mmHg. Aortic valve area, by VTI measures 1.97 cm. Pulmonic Valve: The pulmonic valve was not well visualized. Pulmonic valve regurgitation is not visualized. No evidence of pulmonic stenosis. Aorta: The aortic root is normal in size and structure. Venous: The inferior vena cava is dilated in size with less than 50% respiratory variability, suggesting right atrial pressure of 15 mmHg. IAS/Shunts: No atrial level shunt detected by color flow Doppler.  LEFT VENTRICLE PLAX 2D LVIDd:         4.50 cm      Diastology LVIDs:         3.30 cm       LV e' medial:    6.20 cm/s LV PW:         0.90 cm      LV E/e' medial:  25.0 LV IVS:        0.70 cm      LV e' lateral:   4.68 cm/s LVOT diam:     1.90 cm      LV E/e' lateral: 33.1 LV SV:         62 LV SV Index:   38 LVOT Area:     2.84 cm  LV Volumes (MOD) LV vol d, MOD A2C: 107.0 ml LV vol d, MOD A4C: 123.0 ml LV vol s, MOD A2C: 57.4 ml LV vol s, MOD A4C: 58.5 ml LV SV MOD A2C:     49.6 ml LV SV MOD A4C:     123.0 ml LV SV MOD BP:      59.3 ml RIGHT VENTRICLE RV Basal diam:  4.10 cm RV Mid diam:    3.30 cm RV S prime:     14.00 cm/s TAPSE (M-mode): 2.5 cm LEFT ATRIUM             Index        RIGHT ATRIUM           Index LA diam:        3.40 cm 2.10 cm/m   RA Area:     16.50 cm LA Vol (A2C):   69.3 ml 42.87 ml/m  RA Volume:   47.40 ml  29.32 ml/m LA Vol (A4C):   71.5 ml 44.23 ml/m LA Biplane Vol: 71.0 ml 43.92 ml/m  AORTIC VALVE                     PULMONIC VALVE AV Area (Vmax):    1.83 cm  PV Vmax:       1.38 m/s AV Area (Vmean):   1.78 cm      PV Peak grad:  7.6 mmHg AV Area (VTI):     1.97 cm AV Vmax:           189.00 cm/s AV Vmean:          119.000 cm/s AV VTI:            0.312 m AV Peak Grad:      14.3 mmHg AV Mean Grad:      7.0 mmHg LVOT Vmax:         122.00 cm/s LVOT Vmean:        74.700 cm/s LVOT VTI:          0.217 m LVOT/AV VTI ratio: 0.70 AI PHT:            342 msec  AORTA Ao Root diam: 3.40 cm MITRAL VALVE                TRICUSPID VALVE MV Area (PHT): 4.52 cm     TR Peak grad:   30.5 mmHg MV Area VTI:   1.23 cm     TR Vmax:        276.00 cm/s MV Peak grad:  15.5 mmHg MV Mean grad:  5.3 mmHg     SHUNTS MV Vmax:       1.97 m/s     Systemic VTI:  0.22 m MV Vmean:      106.3 cm/s   Systemic Diam: 1.90 cm MV Decel Time: 168 msec MR Peak grad: 113.6 mmHg MR Vmax:      533.00 cm/s MV E velocity: 155.00 cm/s MV A velocity: 165.00 cm/s MV E/A ratio:  0.94 Kirk Ruths MD Electronically signed by Kirk Ruths MD Signature Date/Time: 01/30/2022/2:43:38 PM    Final    DG Abd Portable  1V  Result Date: 01/30/2022 CLINICAL DATA:  Small bowel obstruction. EXAM: PORTABLE ABDOMEN - 1 VIEW COMPARISON:  01/29/2022 CT and prior studies FINDINGS: A mildly distended loop of small bowel within the central abdomen is noted. No other gas-filled dilated bowel loops are present. Gas in the colon is present. Contrast in the bladder from recent CT noted. Aorta bi-iliac stent again identified. IMPRESSION: Mildly distended loop of small bowel within the central abdomen. No other dilated bowel loops identified on this examination. Electronically Signed   By: Margarette Canada M.D.   On: 01/30/2022 09:36   CT Abdomen Pelvis W Contrast  Result Date: 01/29/2022 CLINICAL DATA:  Sepsis, abdominal pain EXAM: CT ABDOMEN AND PELVIS WITH CONTRAST TECHNIQUE: Multidetector CT imaging of the abdomen and pelvis was performed using the standard protocol following bolus administration of intravenous contrast. RADIATION DOSE REDUCTION: This exam was performed according to the departmental dose-optimization program which includes automated exposure control, adjustment of the mA and/or kV according to patient size and/or use of iterative reconstruction technique. CONTRAST:  48m OMNIPAQUE IOHEXOL 350 MG/ML SOLN COMPARISON:  10/29/2021 FINDINGS: Lower chest: No acute abnormality. Coronary artery and aortic calcifications. Hepatobiliary: No focal hepatic abnormality. Gallbladder unremarkable. Pancreas: No focal abnormality or ductal dilatation. Spleen: No focal abnormality.  Normal size. Adrenals/Urinary Tract: No adrenal abnormality. No focal renal abnormality. No stones or hydronephrosis. Urinary bladder is unremarkable. Stomach/Bowel: Dilated small bowel loops noted in the abdomen and continuing into the pelvis. Distal small bowel loops are decompressed. Cannot exclude mild distal small bowel obstruction. 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 Maximum aneurysm sac size 4.5 cm compared to 4.6 cm  previously. No adenopathy Reproductive: Prostate enlargement. Other: No free fluid or free air. Musculoskeletal: No acute bony abnormality. IMPRESSION: Mildly dilated and fluid-filled small bowel loops continue to the pelvis. Distal small bowel is decompressed. Cannot exclude mild distal small bowel obstruction. Electronically Signed   By: Rolm Baptise M.D.   On: 01/29/2022 22:41   DG Chest Port 1 View  Result Date: 01/29/2022 CLINICAL DATA:  Fever, shortness of breath EXAM: PORTABLE CHEST 1 VIEW COMPARISON:  CT chest dated 06/16/2021 FINDINGS: Lungs are clear.  No pleural effusion or pneumothorax. The heart is normal in size. Aortic stent at the aortic hiatus. IMPRESSION: No evidence of acute cardiopulmonary disease. Electronically Signed   By: Julian Hy M.D.   On: 01/29/2022 20:48      Signature  Lala Lund M.D on 02/02/2022 at 9:36 AM   -  To page go to [email or web address]

## 2022-02-02 NOTE — Progress Notes (Signed)
[facility] for Infectious Disease    Date of Admission:  01/29/2022   Total days of antibiotics 5          ID: Erik Haug. is a 81 y.o. male with  MSSA bacteremia of unclear etiology Principal Problem:   Shock ([place]) Active Problems:   Protein-calorie malnutrition, severe    Subjective: Afebrile. Off of pressors, no longer in the ICU. Reports no fever, nor nighsweats, denies abdominal pain. Hungry this morning.  Medications:   aspirin  81 mg Oral Daily   Chlorhexidine Gluconate Cloth  6 each Topical Daily   clopidogrel  75 mg Oral Daily   ezetimibe  10 mg Oral Daily   famotidine  20 mg Oral QHS   feeding supplement  237 mL Oral TID BM   heparin injection (subcutaneous)  5,000 Units Subcutaneous Q8H   mirtazapine  15 mg Oral QHS   multivitamin with minerals  1 tablet Oral Daily   rOPINIRole  1.5 mg Oral Daily   rOPINIRole  3 mg Oral QHS   tamsulosin  0.4 mg Oral Daily    Objective: Vital signs in last 24 hours: Temp:  [98 F (36.7 C)-99 F (37.2 C)] 99 F (37.2 C) (11/21 1600) Pulse Rate:  [69-85] 69 (11/21 1200) Resp:  [14-21] 20 (11/21 1600) BP: (106-131)/(54-59) 131/55 (11/21 1200) SpO2:  [90 %-93 %] 92 % (11/21 1200) Weight:  [56.7 kg] 56.7 kg (11/21 0500)  Physical Exam  Constitutional: He is oriented to person, place, and time. He appears stated age and under-nourished. No distress.  HENT:  Mouth/Throat: Oropharynx is clear and moist. No oropharyngeal exudate.  Cardiovascular: Normal rate, regular rhythm and normal heart sounds. Exam reveals no gallop and no friction rub.  No murmur heard.  Pulmonary/Chest: Effort normal and breath sounds normal. No respiratory distress. He has no wheezes.  Abdominal: Soft. Bowel sounds are normal. He exhibits no distension. There is no tenderness.  Lymphadenopathy:  He has no cervical adenopathy.  Neurological: He is alert and oriented to person, place, and time.  Skin: Skin is warm and dry. No rash noted.  No erythema.  Psychiatric: He has a normal mood and affect. His behavior is normal.    Lab Results Recent Labs    02/01/22 0212 02/02/22 0316  WBC 10.5 9.2  HGB 7.6* 7.7*  HCT 23.7* 23.1*  NA 133* 135  K 4.4 3.8  CL 106 104  CO2 20* 23  BUN 58* 56*  CREATININE 1.80* 1.33*   Liver Panel Recent Labs    02/02/22 0316  PROT 5.4*  ALBUMIN 2.0*  AST 688*  ALT 387*  ALKPHOS 86  BILITOT 0.5    Microbiology: Blood cx 11/19 NGTD Blood cx 11/17 MSSA Studies/Results: US Abdomen Limited RUQ (LIVER/GB)  Result Date: 02/02/2022 CLINICAL DATA:  Transaminitis.  History of cholecystectomy. EXAM: ULTRASOUND ABDOMEN LIMITED RIGHT UPPER QUADRANT COMPARISON:  Abdominopelvic CT 01/29/2022. FINDINGS: Gallbladder: Status post cholecystectomy. Possible small amount of fluid within the cholecystectomy bed. Cholecystectomy does not appear to have been performed recently. Common bile duct: Diameter: 5 mm.  No intrahepatic biliary dilatation. Liver: Mildly increased hepatic echogenicity, most typically due to steatosis. No focal abnormality. Portal vein is patent on color Doppler imaging with normal direction of blood flow towards the liver. Other: No generalized ascites.  Aortic stent graft noted. IMPRESSION: 1. No biliary dilatation status post cholecystectomy. Possible small amount of fluid within the cholecystectomy bed, nonspecific. 2. Hepatic steatosis. Electronically Signed  By: Richardean Sale M.D.   On: 02/02/2022 12:10   DG Abd 1 View  Result Date: 02/01/2022 CLINICAL DATA:  [id number] Abdominal pain [id number] EXAM: ABDOMEN - 1 VIEW COMPARISON:  X-ray abdomen 01/30/2022, CT abdomen pelvis 01/29/2022 FINDINGS: Interval development of gaseous dilatation of the colon. No gaseous dilatation of the small bowel. No radio-opaque calculi or other significant radiographic abnormality are seen. Aorta to bilateral iliac repair noted. IMPRESSION: Interval development of gaseous dilatation of the colon. Consider  CT with intravenous contrast for further evaluation. Electronically Signed   By: Iven Finn M.D.   On: 02/01/2022 17:44     Assessment/Plan: MSSA bacteremia = continue on cefazolin. On schedule for TEE tomorrow morning to evaluate for endocarditis. His blood cx have cleared quickly. May need to consider discussing with radiology best modality to look at aortic graft and avoid contrast. Await on TEE findings to decide if further studies are needed  Aki = improving since IVF from sepsis  Transaminitis = suspect it is related to shock liver, U/S shows hx of cholecystectomy but also has aortic stent graft.   Leukocytosis = improved with abtx and treatment of sepsis.  [facility] for Infectious Diseases Pager: [phone]  02/02/2022, 5:02 PM

## 2022-02-03 ENCOUNTER — Inpatient Hospital Stay (HOSPITAL_COMMUNITY): Payer: Medicare Other | Admitting: Certified Registered"

## 2022-02-03 ENCOUNTER — Encounter (HOSPITAL_COMMUNITY)
Admission: EM | Disposition: A | Discharge status: Home Health | Payer: Self-pay | Source: Home / Self Care | Attending: Critical Care Medicine

## 2022-02-03 ENCOUNTER — Inpatient Hospital Stay: Payer: Self-pay

## 2022-02-03 ENCOUNTER — Other Ambulatory Visit (HOSPITAL_COMMUNITY): Payer: Medicare Other

## 2022-02-03 ENCOUNTER — Inpatient Hospital Stay (HOSPITAL_COMMUNITY): Payer: Medicare Other

## 2022-02-03 ENCOUNTER — Encounter (HOSPITAL_COMMUNITY): Payer: Self-pay | Admitting: Pulmonary Disease

## 2022-02-03 DIAGNOSIS — I340 Nonrheumatic mitral (valve) insufficiency: Secondary | ICD-10-CM | POA: Diagnosis not present

## 2022-02-03 DIAGNOSIS — R579 Shock, unspecified: Secondary | ICD-10-CM | POA: Diagnosis not present

## 2022-02-03 DIAGNOSIS — I2510 Atherosclerotic heart disease of native coronary artery without angina pectoris: Secondary | ICD-10-CM

## 2022-02-03 DIAGNOSIS — I361 Nonrheumatic tricuspid (valve) insufficiency: Secondary | ICD-10-CM

## 2022-02-03 DIAGNOSIS — I252 Old myocardial infarction: Secondary | ICD-10-CM

## 2022-02-03 DIAGNOSIS — I110 Hypertensive heart disease with heart failure: Secondary | ICD-10-CM

## 2022-02-03 DIAGNOSIS — I342 Nonrheumatic mitral (valve) stenosis: Secondary | ICD-10-CM | POA: Diagnosis not present

## 2022-02-03 DIAGNOSIS — I081 Rheumatic disorders of both mitral and tricuspid valves: Secondary | ICD-10-CM

## 2022-02-03 DIAGNOSIS — I509 Heart failure, unspecified: Secondary | ICD-10-CM

## 2022-02-03 DIAGNOSIS — Q2112 Patent foramen ovale: Secondary | ICD-10-CM

## 2022-02-03 DIAGNOSIS — R7881 Bacteremia: Secondary | ICD-10-CM | POA: Diagnosis not present

## 2022-02-03 DIAGNOSIS — B9561 Methicillin susceptible Staphylococcus aureus infection as the cause of diseases classified elsewhere: Secondary | ICD-10-CM

## 2022-02-03 HISTORY — PX: TEE WITHOUT CARDIOVERSION: SHX5443

## 2022-02-03 HISTORY — PX: BUBBLE STUDY: SHX6837

## 2022-02-03 LAB — CBC WITH DIFFERENTIAL/PLATELET
Abs Immature Granulocytes: 0.05 10*3/uL (ref 0.00–0.07)
Basophils Absolute: 0.1 10*3/uL (ref 0.0–0.1)
Basophils Relative: 1 %
Eosinophils Absolute: 0.3 10*3/uL (ref 0.0–0.5)
Eosinophils Relative: 3 %
HCT: 23.2 % — ABNORMAL LOW (ref 39.0–52.0)
Hemoglobin: 7.6 g/dL — ABNORMAL LOW (ref 13.0–17.0)
Immature Granulocytes: 1 %
Lymphocytes Relative: 8 %
Lymphs Abs: 0.7 10*3/uL (ref 0.7–4.0)
MCH: 28.6 pg (ref 26.0–34.0)
MCHC: 32.8 g/dL (ref 30.0–36.0)
MCV: 87.2 fL (ref 80.0–100.0)
Monocytes Absolute: 1.1 10*3/uL — ABNORMAL HIGH (ref 0.1–1.0)
Monocytes Relative: 13 %
Neutro Abs: 6.6 10*3/uL (ref 1.7–7.7)
Neutrophils Relative %: 74 %
Platelets: 109 10*3/uL — ABNORMAL LOW (ref 150–400)
RBC: 2.66 MIL/uL — ABNORMAL LOW (ref 4.22–5.81)
RDW: 13.8 % (ref 11.5–15.5)
WBC: 8.8 10*3/uL (ref 4.0–10.5)
nRBC: 0.2 % (ref 0.0–0.2)

## 2022-02-03 LAB — COMPREHENSIVE METABOLIC PANEL
ALT: 177 U/L — ABNORMAL HIGH (ref 0–44)
AST: 338 U/L — ABNORMAL HIGH (ref 15–41)
Albumin: 2 g/dL — ABNORMAL LOW (ref 3.5–5.0)
Alkaline Phosphatase: 87 U/L (ref 38–126)
Anion gap: 12 (ref 5–15)
BUN: 43 mg/dL — ABNORMAL HIGH (ref 8–23)
CO2: 22 mmol/L (ref 22–32)
Calcium: 7.5 mg/dL — ABNORMAL LOW (ref 8.9–10.3)
Chloride: 103 mmol/L (ref 98–111)
Creatinine, Ser: 1.05 mg/dL (ref 0.61–1.24)
GFR, Estimated: >60 mL/min (ref >60)
Glucose, Bld: 100 mg/dL — ABNORMAL HIGH (ref 70–99)
Potassium: 3.8 mmol/L (ref 3.5–5.1)
Sodium: 137 mmol/L (ref 135–145)
Total Bilirubin: 0.5 mg/dL (ref 0.3–1.2)
Total Protein: 5.1 g/dL — ABNORMAL LOW (ref 6.5–8.1)

## 2022-02-03 LAB — BRAIN NATRIURETIC PEPTIDE: B Natriuretic Peptide: 2061.5 pg/mL — ABNORMAL HIGH (ref 0.0–100.0)

## 2022-02-03 LAB — PROTIME-INR
INR: 1.3 — ABNORMAL HIGH (ref 0.8–1.2)
Prothrombin Time: 15.8 seconds — ABNORMAL HIGH (ref 11.4–15.2)

## 2022-02-03 LAB — ECHO TEE
MV M vel: 5.95 m/s
MV Peak grad: 141.6 mmHg
Radius: 0.5 cm

## 2022-02-03 LAB — MAGNESIUM: Magnesium: 2.4 mg/dL (ref 1.7–2.4)

## 2022-02-03 LAB — C-REACTIVE PROTEIN: CRP: 7 mg/dL — ABNORMAL HIGH (ref <1.0)

## 2022-02-03 SURGERY — ECHOCARDIOGRAM, TRANSESOPHAGEAL
Anesthesia: Monitor Anesthesia Care

## 2022-02-03 MED ORDER — CEFAZOLIN IV (FOR PTA / DISCHARGE USE ONLY): 2.0000 g | Freq: Three times a day (TID) | INTRAVENOUS | 0 refills | Status: DC

## 2022-02-03 MED ORDER — SODIUM CHLORIDE 0.9% FLUSH: 10.0000 mL | INTRAVENOUS | Status: DC | PRN

## 2022-02-03 MED ORDER — PROPOFOL 10 MG/ML IV BOLUS
INTRAVENOUS | Status: DC | PRN
  Administered 2022-02-03: 30 mg via INTRAVENOUS

## 2022-02-03 MED ORDER — PROPOFOL 500 MG/50ML IV EMUL
INTRAVENOUS | Status: DC | PRN
  Administered 2022-02-03: 100 ug/kg/min via INTRAVENOUS

## 2022-02-03 MED ORDER — SODIUM CHLORIDE 0.9 % IV SOLN: INTRAVENOUS | Status: DC

## 2022-02-03 NOTE — Progress Notes (Signed)
PROGRESS NOTE                                                                                                                                                                                                             Patient Demographics:    Erik [name], is a 81 y.o. male, DOB - [birth year], YFV:[id number]  Outpatient Primary MD for the patient is [name], MD    LOS - 4  Admit date - 01/29/2022    Chief Complaint  Patient presents with   Fever   Shortness of Breath       Brief Narrative (HPI from H&P)   81 year old male with past medical history as below, which is significant for ischemic cardiomyopathy, CAD status post PCI, heart failure reduced ejection fraction, COPD, and aortic aneurysm.  He was in his usual state of health until 11/17 when he developed chills, cough, and mild abdominal pain.  Denies productive cough, nausea, vomiting, diarrhea.  No other complaints.  Upon arrival to the emergency department he was noted to be hypotensive refractory to IV fluid resuscitation and was started on norepinephrine infusion.    Work-up included chest x-ray which was clear and CT abdomen and pelvis which was concerning for possible early small bowel obstruction/Ileus.  Laboratory evaluation was significant for serum creatinine of 1.3, lactic acid 2.5, and hemoglobin 9.  PCCM was requested to admit to ICU for vasopressor management, he was stabilized and transferred to hospitalist service on 02/02/2022 on days 3 of hospital stay.  He is currently off of pressors however his LFTs are high, he is awaiting TEE tomorrow.   Subjective:   Limited appetite, tolerated procedure, no fever or chills   Assessment  & Plan :   Septic shock due to MSSA bacteremia- unclear initial source/ portal of entry - off NE, Appreciate ID's management, TEE on 11/22 no vegetations, con't cefazolin, he is now off of pressors and septic shock seems  to have resolved.  Continue to monitor closely. Has aortobiiliac endograft, per IR dr. Anselm Pancoast imaging from 11/17 adequate to r/o infection of that graft   Chronic HFrEF (LVEF 40-45% on last echo, 50% on TEE today) 2/2 ICM> appears to have had some recovery of EF,  CAD -  Holding PTA Entresto, farxiga, diuretics, and B.blocker, hold statin as LFTs have gone up, holding  ezetimibe, monitor on tele.      Asymptomatic transaminitis.  Likely due to shock liver, acute hepatitis panel and right upper quadrant ultrasound ordered, both negative. Improving today  AKI with lactic acidosis.  Due to septic shock.  Improved.  Continue to monitor.   Coagulopathy 2/2 sepsis, present on admission -vit K. Inr improving  Anemia, acute on chronic- currently stable.  DIC ruled out in ICU monitor closely.   Hyponatremia -cont to monitor   RLS -con't PTA ropinirole, avoid phenergan- compazine and zofran preferred, SCDs should help   Deconditioning, PT, OT advising HH PT  Questionable ileus on x-ray done in ICU on 02/01/2022.  Currently symptom-free.  Requests regular diet, will order       Condition - stable  Family Communication  :  wife @ bedside 11/22  Code Status :  Full  Consults  :  PCCM, ID, Cards  PUD Prophylaxis : Pepcid   Procedures  :      TEE      Disposition Plan  :    Status is: Inpatient  DVT Prophylaxis  :    heparin injection 5,000 Units Start: 02/01/22 1400 Place and maintain sequential compression device Start: 01/31/22 1023    Lab Results  Component Value Date   PLT 109 (L) 02/03/2022    Diet :  Diet Order             Diet Heart Room service appropriate? Yes; Fluid consistency: Thin  Diet effective now                    Inpatient Medications  Scheduled Meds:  aspirin  81 mg Oral Daily   Chlorhexidine Gluconate Cloth  6 each Topical Daily   clopidogrel  75 mg Oral Daily   ezetimibe  10 mg Oral Daily   famotidine  20 mg Oral QHS   feeding supplement   237 mL Oral TID BM   heparin injection (subcutaneous)  5,000 Units Subcutaneous Q8H   mirtazapine  15 mg Oral QHS   multivitamin with minerals  1 tablet Oral Daily   rOPINIRole  1.5 mg Oral Daily   rOPINIRole  3 mg Oral QHS   tamsulosin  0.4 mg Oral Daily   Continuous Infusions:  sodium chloride 250 mL (02/01/22 2251)    ceFAZolin (ANCEF) IV 200 mL/hr at 02/03/22 0522   PRN Meds:.fentaNYL (SUBLIMAZE) injection, ondansetron (ZOFRAN) IV, mouth rinse, prochlorperazine    Objective:   Vitals:   02/03/22 0836 02/03/22 0842 02/03/22 0850 02/03/22 0944  BP: (!) 126/54 (!) 130/55 (!) 139/51 (!) 150/55  Pulse: 62 (!) 58 67 78  Resp: (!) 27 (!) 25 (!) 26 19  Temp: 98 F (36.7 C)     TempSrc: Temporal     SpO2: 93% 94% 92% 96%  Weight:      Height:        Wt Readings from Last 3 Encounters:  02/02/22 56.7 kg  11/27/21 52.3 kg  11/03/21 52.8 kg     Intake/Output Summary (Last 24 hours) at 02/03/2022 1103 Last data filed at 02/03/2022 5726 Gross per 24 hour  Intake 371.34 ml  Output --  Net 371.34 ml     Physical Exam  Awake Alert, No new F.N deficits, Normal affect [place].AT,PERRAL Supple Neck, No JVD,   Symmetrical Chest wall movement, Good air movement bilaterally, CTAB RRR,No Gallops,Rubs or new Murmurs,  +ve B.Sounds, Abd Soft, No tenderness,   No Cyanosis, Clubbing or edema  Data Review:    Recent Labs  Lab 01/29/22 2011 01/30/22 0129 01/31/22 0518 01/31/22 1102 02/01/22 0212 02/02/22 0316 02/03/22 0536  WBC 7.9 20.1* 14.2*  --  10.5 9.2 8.8  HGB 9.0* 8.4* 7.7*  --  7.6* 7.7* 7.6*  HCT 27.0* 26.1* 24.3*  --  23.7* 23.1* 23.2*  PLT 249 237 188 167 137* 110* 109*  MCV 89.4 90.6 91.7  --  90.1 86.5 87.2  MCH 29.8 29.2 29.1  --  28.9 28.8 28.6  MCHC 33.3 32.2 31.7  --  32.1 33.3 32.8  RDW 13.8 13.9 13.9  --  13.7 13.5 13.8  LYMPHSABS 0.3*  --   --   --   --  0.6* 0.7  MONOABS 0.1  --   --   --   --  0.9 1.1*  EOSABS 0.0  --   --   --   --  0.3 0.3   BASOSABS 0.0  --   --   --   --  0.0 0.1    Recent Labs  Lab 01/29/22 2011 01/29/22 2217 01/30/22 0129 01/30/22 2033 01/31/22 0518 01/31/22 1102 02/01/22 0212 02/02/22 0316 02/02/22 0616 02/03/22 0536  NA 135  --  135  --  133*  --  133* 135  --  137  K 3.6  --  4.0  --  4.9  --  4.4 3.8  --  3.8  CL 105  --  103  --  100  --  106 104  --  103  CO2 19*  --  18*  --  18*  --  20* 23  --  22  GLUCOSE 121*  --  105*  --  92  --  107* 121*  --  100*  BUN 26*  --  26*  --  44*  --  58* 56*  --  43*  CREATININE 1.30*  --  1.37*  --  2.01*  --  1.80* 1.33*  --  1.05  AST 16  --   --   --   --   --   --  688*  --  338*  ALT 9  --   --   --   --   --   --  387*  --  177*  ALKPHOS 52  --   --   --   --   --   --  86  --  87  BILITOT 0.5  --   --   --   --   --   --  0.5  --  0.5  ALBUMIN 2.6*  --   --   --   --   --   --  2.0*  --  2.0*  CRP  --   --   --   --   --   --   --   --   --  7.0*  DDIMER  --   --   --   --   --  >20.00*  --   --   --   --   PROCALCITON  --   --  46.95  --   --   --   --   --   --   --   LATICACIDVEN 2.5* 2.2*  --  2.7* 2.7*  --  0.9  --   --   --   INR 1.4*  --   --   --   --  2.2*  --   --  1.3* 1.3*  BNP  --   --   --   --   --   --   --   --   --  2,061.5*  MG  --   --  1.7  --  2.3  --   --   --   --  2.4  CALCIUM 8.3*  --  8.3*  --  7.7*  --  7.2* 7.6*  --  7.5*   Radiology Reports ECHO TEE  Result Date: 02/03/2022    TRANSESOPHOGEAL ECHO REPORT   Patient Name:   Tameron Lama. Date of Exam: 02/03/2022 Medical Rec #:  [id number]             Height:       66.0 in Accession #:    [id number]            Weight:       125.0 lb Date of Birth:  1940-06-16              BSA:          1.638 m Patient Age:    89 years              BP:           130/55 mmHg Patient Gender: M                     HR:           70 bpm. Exam Location:  Inpatient Procedure: Transesophageal Echo, Color Doppler, Cardiac Doppler and 3D Echo Indications:     Bacteremia  History:          Patient has prior history of Echocardiogram examinations, most                  recent 01/30/2022. Cardiomyopathy, CAD, COPD,                  Signs/Symptoms:Murmur; Risk Factors:Dyslipidemia, Current                  Smoker and Hypertension.  Sonographer:     Johny Chess RDCS Referring Phys:  [id number] Lily Kocher Diagnosing Phys: Gwyndolyn Kaufman MD PROCEDURE: After discussion of the risks and benefits of a TEE, an informed consent was obtained from the patient. The transesophogeal probe was passed without difficulty through the esophogus of the patient. Imaged were obtained with the patient in a left lateral decubitus position. Sedation performed by different physician. The patient was monitored while under deep sedation. Anesthestetic sedation was provided intravenously by Anesthesiology: '143mg'$  of Propofol. The patient developed no complications during the procedure.  IMPRESSIONS  1. No valvular vegetations visualized.  2. Left ventricular ejection fraction, by estimation, is 50%. The left ventricle has low normal function.  3. Right ventricular systolic function is normal. The right ventricular size is normal. There is moderately elevated pulmonary artery systolic pressure. The estimated right ventricular systolic pressure is 48.1 mmHg.  4. Left atrial size was moderately dilated. No left atrial/left atrial appendage thrombus was detected.  5. Right atrial size was mildly dilated.  6. The mitral valve is degenerative. Mild to moderate mitral valve regurgitation with EROA 0.11cm2, RVol 75m. Mild to moderate mitral stenosis with average mean gradient 6.589mg at HR 62bpm. Severe mitral annular calcification most notably on the posterior MV annulus.  7. Tricuspid valve regurgitation is mild to moderate.  8. The aortic valve is  tricuspid. There is mild calcification of the aortic valve. There is mild thickening of the aortic valve. Aortic valve regurgitation is mild. Aortic valve sclerosis/calcification is  present, without any evidence of aortic stenosis.  9. There is Severe (Grade IV) plaque. 10. Evidence of atrial level shunting detected by color flow Doppler. 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 Agitated saline bubble study was positive with valsalva. FINDINGS  Left Ventricle: Left ventricular ejection fraction, by estimation, is 50%. The left ventricle has low normal function. The left ventricular internal cavity size was normal in size. Right Ventricle: The right ventricular size is normal. No increase in right ventricular wall thickness. Right ventricular systolic function is normal. There is moderately elevated pulmonary artery systolic pressure. The tricuspid regurgitant velocity is 3.34 m/s, and with an assumed right atrial pressure of 3 mmHg, the estimated right ventricular systolic pressure is 54.0 mmHg. Left Atrium: Left atrial size was moderately dilated. No left atrial/left atrial appendage thrombus was detected. Right Atrium: Right atrial size was mildly dilated. Pericardium: There is no evidence of pericardial effusion. Mitral Valve: EROA 0.11cm2, RVol 57m. The mitral valve is degenerative in appearance. There is mild thickening of the mitral valve leaflet(s). There is mild calcification of the mitral valve leaflet(s). Severe mitral annular calcification. Mild to moderate mitral valve regurgitation. Mild to moderate mitral valve stenosis. MV peak gradient, 11.6 mmHg. The mean mitral valve gradient is 6.5 mmHg. There is no evidence of mitral valve vegetation. Tricuspid Valve: The tricuspid valve is normal in structure. Tricuspid valve regurgitation is mild to moderate. There is no evidence of tricuspid valve vegetation. Aortic Valve: The aortic valve is tricuspid. There is mild calcification of the aortic valve. There is mild thickening of the aortic valve. Aortic valve regurgitation is mild. Aortic valve sclerosis/calcification is present,  without any evidence of aortic stenosis. There is no evidence of aortic valve vegetation. Pulmonic Valve: The pulmonic valve was normal in structure. Pulmonic valve regurgitation is trivial. There is no evidence of pulmonic valve vegetation. Aorta: The aortic root is normal in size and structure. There is severe (Grade IV) plaque. IAS/Shunts: Evidence of atrial level shunting detected by color flow Doppler. Agitated saline contrast was given intravenously to evaluate for intracardiac shunting.  MITRAL VALVE                  TRICUSPID VALVE MV Peak grad: 11.6 mmHg       TR Peak grad:   44.6 mmHg MV Mean grad: 6.5 mmHg        TR Vmax:        334.00 cm/s MV Vmax:      1.70 m/s MV Vmean:     119.5 cm/s MR Peak grad:    141.6 mmHg MR Mean grad:    81.0 mmHg MR Vmax:         595.00 cm/s MR Vmean:        419.0 cm/s MR PISA:         1.57 cm MR PISA Eff ROA: 11 mm MR PISA Radius:  0.50 cm HGwyndolyn KaufmanMD Electronically signed by HGwyndolyn KaufmanMD Signature Date/Time: 02/03/2022/9:25:01 AM    Final    [place]domen Limited RUQ (LIVER/GB)  Result Date: 02/02/2022 CLINICAL DATA:  Transaminitis.  History of cholecystectomy. EXAM: ULTRASOUND ABDOMEN LIMITED RIGHT UPPER QUADRANT COMPARISON:  Abdominopelvic CT 01/29/2022. FINDINGS: Gallbladder: Status post cholecystectomy. Possible small amount of fluid within the cholecystectomy bed. Cholecystectomy  does not appear to have been performed recently. Common bile duct: Diameter: 5 mm.  No intrahepatic biliary dilatation. Liver: Mildly increased hepatic echogenicity, most typically due to steatosis. No focal abnormality. Portal vein is patent on color Doppler imaging with normal direction of blood flow towards the liver. Other: No generalized ascites.  Aortic stent graft noted. IMPRESSION: 1. No biliary dilatation status post cholecystectomy. Possible small amount of fluid within the cholecystectomy bed, nonspecific. 2. Hepatic steatosis. Electronically Signed   By: Richardean Sale M.D.   On: 02/02/2022 12:10   DG Abd 1 View  Result Date: 02/01/2022 CLINICAL DATA:  [id number] Abdominal pain [id number] EXAM: ABDOMEN - 1 VIEW COMPARISON:  X-ray abdomen 01/30/2022, CT abdomen pelvis 01/29/2022 FINDINGS: Interval development of gaseous dilatation of the colon. No gaseous dilatation of the small bowel. No radio-opaque calculi or other significant radiographic abnormality are seen. Aorta to bilateral iliac repair noted. IMPRESSION: Interval development of gaseous dilatation of the colon. Consider CT with intravenous contrast for further evaluation. Electronically Signed   By: Iven Finn M.D.   On: 02/01/2022 17:44   ECHOCARDIOGRAM COMPLETE  Result Date: 01/30/2022    ECHOCARDIOGRAM REPORT   Patient Name:   Oswaldo Cueto. Date of Exam: 01/30/2022 Medical Rec #:  [id number]             Height:       66.0 in Accession #:    [id number]            Weight:       121.3 lb Date of Birth:  11-27-1940              BSA:          1.617 m Patient Age:    37 years              BP:           104/57 mmHg Patient Gender: M                     HR:           84 bpm. Exam Location:  Inpatient Procedure: 2D Echo Indications:    cardiomyopathy-ischemic  History:        Patient has prior history of Echocardiogram examinations, most                 recent 07/21/2020. Cardiomyopathy, CAD, COPD,                 Signs/Symptoms:Murmur; Risk Factors:Dyslipidemia.  Sonographer:    Harvie Junior Referring Phys: [id number] Silvestre Moment [facility]  Sonographer Comments: Technically difficult study due to poor echo windows. IMPRESSIONS  1. Left ventricular ejection fraction, by estimation, is 50 to 55%. The left ventricle has low normal function. The left ventricle has no regional wall motion abnormalities. There is mild left ventricular hypertrophy. Left ventricular diastolic function  could not be evaluated.  2. Right ventricular systolic function is normal. The right ventricular size is normal. There is moderately elevated  pulmonary artery systolic pressure.  3. Left atrial size was moderately dilated.  4. The mitral valve is normal in structure. Mild to moderate mitral valve regurgitation. Mild mitral stenosis. Severe mitral annular calcification.  5. The aortic valve has an indeterminant number of cusps. Aortic valve regurgitation is mild. Aortic valve sclerosis/calcification is present, without any evidence of aortic stenosis.  6. The inferior vena cava is dilated in size with <50% respiratory variability, suggesting right atrial  pressure of 15 mmHg. FINDINGS  Left Ventricle: Left ventricular ejection fraction, by estimation, is 50 to 55%. The left ventricle has low normal function. The left ventricle has no regional wall motion abnormalities. The left ventricular internal cavity size was normal in size. There is mild left ventricular hypertrophy. Left ventricular diastolic function could not be evaluated due to mitral annular calcification (moderate or greater). Left ventricular diastolic function could not be evaluated. Right Ventricle: The right ventricular size is normal. Right ventricular systolic function is normal. There is moderately elevated pulmonary artery systolic pressure. The tricuspid regurgitant velocity is 2.76 m/s, and with an assumed right atrial pressure of 15 mmHg, the estimated right ventricular systolic pressure is 40.9 mmHg. Left Atrium: Left atrial size was moderately dilated. Right Atrium: Right atrial size was normal in size. Pericardium: There is no evidence of pericardial effusion. Mitral Valve: The mitral valve is normal in structure. Severe mitral annular calcification. Mild to moderate mitral valve regurgitation. Mild mitral valve stenosis. MV peak gradient, 15.5 mmHg. The mean mitral valve gradient is 5.3 mmHg. Tricuspid Valve: The tricuspid valve is normal in structure. Tricuspid valve regurgitation is trivial. No evidence of tricuspid stenosis. Aortic Valve: The aortic valve has an indeterminant  number of cusps. Aortic valve regurgitation is mild. Aortic regurgitation PHT measures 342 msec. Aortic valve sclerosis/calcification is present, without any evidence of aortic stenosis. Aortic valve mean gradient measures 7.0 mmHg. Aortic valve peak gradient measures 14.3 mmHg. Aortic valve area, by VTI measures 1.97 cm. Pulmonic Valve: The pulmonic valve was not well visualized. Pulmonic valve regurgitation is not visualized. No evidence of pulmonic stenosis. Aorta: The aortic root is normal in size and structure. Venous: The inferior vena cava is dilated in size with less than 50% respiratory variability, suggesting right atrial pressure of 15 mmHg. IAS/Shunts: No atrial level shunt detected by color flow Doppler.  LEFT VENTRICLE PLAX 2D LVIDd:         4.50 cm      Diastology LVIDs:         3.30 cm      LV e' medial:    6.20 cm/s LV PW:         0.90 cm      LV E/e' medial:  25.0 LV IVS:        0.70 cm      LV e' lateral:   4.68 cm/s LVOT diam:     1.90 cm      LV E/e' lateral: 33.1 LV SV:         62 LV SV Index:   38 LVOT Area:     2.84 cm  LV Volumes (MOD) LV vol d, MOD A2C: 107.0 ml LV vol d, MOD A4C: 123.0 ml LV vol s, MOD A2C: 57.4 ml LV vol s, MOD A4C: 58.5 ml LV SV MOD A2C:     49.6 ml LV SV MOD A4C:     123.0 ml LV SV MOD BP:      59.3 ml RIGHT VENTRICLE RV Basal diam:  4.10 cm RV Mid diam:    3.30 cm RV S prime:     14.00 cm/s TAPSE (M-mode): 2.5 cm LEFT ATRIUM             Index        RIGHT ATRIUM           Index LA diam:        3.40 cm 2.10 cm/m   RA Area:  16.50 cm LA Vol (A2C):   69.3 ml 42.87 ml/m  RA Volume:   47.40 ml  29.32 ml/m LA Vol (A4C):   71.5 ml 44.23 ml/m LA Biplane Vol: 71.0 ml 43.92 ml/m  AORTIC VALVE                     PULMONIC VALVE AV Area (Vmax):    1.83 cm      PV Vmax:       1.38 m/s AV Area (Vmean):   1.78 cm      PV Peak grad:  7.6 mmHg AV Area (VTI):     1.97 cm AV Vmax:           189.00 cm/s AV Vmean:          119.000 cm/s AV VTI:            0.312 m AV Peak Grad:       14.3 mmHg AV Mean Grad:      7.0 mmHg LVOT Vmax:         122.00 cm/s LVOT Vmean:        74.700 cm/s LVOT VTI:          0.217 m LVOT/AV VTI ratio: 0.70 AI PHT:            342 msec  AORTA Ao Root diam: 3.40 cm MITRAL VALVE                TRICUSPID VALVE MV Area (PHT): 4.52 cm     TR Peak grad:   30.5 mmHg MV Area VTI:   1.23 cm     TR Vmax:        276.00 cm/s MV Peak grad:  15.5 mmHg MV Mean grad:  5.3 mmHg     SHUNTS MV Vmax:       1.97 m/s     Systemic VTI:  0.22 m MV Vmean:      106.3 cm/s   Systemic Diam: 1.90 cm MV Decel Time: 168 msec MR Peak grad: 113.6 mmHg MR Vmax:      533.00 cm/s MV E velocity: 155.00 cm/s MV A velocity: 165.00 cm/s MV E/A ratio:  0.94 Kirk Ruths MD Electronically signed by Kirk Ruths MD Signature Date/Time: 01/30/2022/2:43:38 PM    Final       Signature  Desma Maxim M.D on 02/03/2022 at 11:03 AM   -  To page go to [email or web address]

## 2022-02-03 NOTE — CV Procedure (Signed)
     Transesophageal Echocardiogram Note  Zarian Colpitts [id number] 1941/02/21  Procedure: Transesophageal Echocardiogram Indications: MSSA bacteremia  Procedure Details Consent: Obtained Time Out: Verified patient identification, verified procedure, site/side was marked, verified correct patient position, special equipment/implants available, Radiology Safety Procedures followed,  medications/allergies/relevent history reviewed, required imaging and test results available.  Performed  Medications: Propofol 116m  No valvular vegetations visualized.   Left Ventrical:  LVEF 50% with septal hypokinesis  Mitral Valve: The mitral valve is degenerative with severe MAC most notably on the posterior MV annulus. There is mild to moderate mitral regurgitation. There is mild-to-moderate MS.  Aortic Valve: Tricuspid with moderate calcification and thickening. There is mild AR.  Tricuspid Valve: Normal structure. Mild to moderate TR. PASP is 420mg +RAP  Pulmonic Valve: Normal structure. Trivial PI  Left Atrium/ Left atrial appendage: No LAA thrombus  Atrial septum: PFO visualized by color doppler with L>R shunting  Aorta: Grade IV plaque    Complications: No apparent complications Patient did tolerate procedure well.  HeFreada BergeronMD 02/03/2022, 8:32 AM

## 2022-02-03 NOTE — Plan of Care (Signed)

## 2022-02-03 NOTE — Discharge Summary (Signed)
Erik [name]. NFA:[id number] DOB: [birth year] [name]: 01/29/2022  PCP: Erik Sanders, MD  Admit date: 01/29/2022 Discharge date: 02/03/2022  Time spent: 35 minutes  Recommendations for Outpatient Follow-up:  Pcp f/u, consider re-start of statin and entresto at that time ID f/u     Discharge Diagnoses:  Principal Problem:   Shock ([place]) Active Problems:   Protein-calorie malnutrition, severe   MSSA bacteremia   Discharge Condition: stable  Diet recommendation: heart healthy  Filed Weights   01/31/22 0500 02/01/22 0235 02/02/22 0500  Weight: 57.8 kg 57.8 kg 56.7 kg    History of present illness:  From admission h and p: 81 year old male with past medical history as below, which is significant for ischemic cardiomyopathy, CAD status post PCI, heart failure reduced ejection fraction, COPD, and aortic aneurysm.  He was in his usual state of health until 11/17 when he developed chills, cough, and mild abdominal pain.  Denies productive cough, nausea, vomiting, diarrhea.  No other complaints.  Upon arrival to the emergency department he was noted to be hypotensive refractory to IV fluid resuscitation and was started on norepinephrine infusion.  Work-up included chest x-ray which was clear and CT abdomen and pelvis which was concerning for possible early small bowel obstruction.  Laboratory evaluation was significant for serum creatinine of 1.3, lactic acid 2.5, and hemoglobin 9.  PCCM was requested to admit to ICU for vasopressor management.   Hospital Course:  Patient presented with septic shock secondary to MSSA bacteremia. Source of infection undetermined. TTE/TEE no endocarditis. Has aortoiliac endograft, no signs of infection there or elswhere on CT. Treated with IV abx, will finish a course of cefazolin to end on 12/17, with ID f/u. PICC placed prior to discharge. Required pressor support initially, now weaned off and BP slightly elevated, will resume some but not all home meds  at discharge. Transaminitis likely 2/2 shock liver as improved with time and fluids. PT/OT evaluated, home health ordered.  Procedures: TEE   Consultations: ID  Discharge Exam: Vitals:   02/03/22 0944 02/03/22 1200  BP: (!) 150/55 (!) 157/58  Pulse: 78 74  Resp: 19 18  Temp: 98 F (36.7 C)   SpO2: 96% 95%    Awake Alert, No new F.N deficits, Normal affect [place].AT,PERRAL Supple Neck, No JVD,   Symmetrical Chest wall movement, Good air movement bilaterally, CTAB RRR,No Gallops,Rubs or new Murmurs,  +ve B.Sounds, Abd Soft, No tenderness,   No Cyanosis, Clubbing or edema  Discharge Instructions   Discharge Instructions     [facility] pharmacist to adjust dose for Vancomycin, Aminoglycosides and other anti-infective therapies as requested by physician.   Complete by: As directed    [facility] to provide Cath Flo 9m   Complete by: As directed    Administer for PICC line occlusion and as ordered by physician for other access device issues.   Anaphylaxis Kit: Provided to treat any anaphylactic reaction to the medication being provided to the patient if First Dose or when requested by physician   Complete by: As directed    Epinephrine 147mml vial / amp: Administer 0.12m412m0.12ml1mubcutaneously once for moderate to severe anaphylaxis, nurse to call physician and pharmacy when reaction occurs and call 911 if needed for immediate care   Diphenhydramine 50mg38mIV vial: Administer 25-50mg 37mM PRN for first dose reaction, rash, itching, mild reaction, nurse to call physician and pharmacy when reaction occurs   Sodium Chloride 0.9% NS 500ml I30mdminister if needed for  hypovolemic blood pressure drop or as ordered by physician after call to physician with anaphylactic reaction   Change dressing on IV access line weekly and PRN   Complete by: As directed    Diet - low sodium heart healthy   Complete by: As directed    Flush IV access with Sodium Chloride 0.9%  and Heparin 10 units/ml or 100 units/ml   Complete by: As directed    Home infusion instructions - [facility]   Complete by: As directed    Instructions: Flush IV access with Sodium Chloride 0.9% and Heparin 10units/ml or 100units/ml   Change dressing on IV access line: Weekly and PRN   Instructions Cath Flo 48m: Administer for PICC Line occlusion and as ordered by physician for other access device   [facility] pharmacist to adjust dose for: Vancomycin, Aminoglycosides and other anti-infective therapies as requested by physician   Increase activity slowly   Complete by: As directed    Method of administration may be changed at the discretion of home infusion pharmacist based upon assessment of the patient and/or caregiver's ability to self-administer the medication ordered   Complete by: As directed    No wound care   Complete by: As directed       Allergies as of 02/03/2022       Reactions   Codeine Swelling   throat swells        Medication List     STOP taking these medications    Entresto 24-26 MG Generic drug: sacubitril-valsartan       TAKE these medications    acetaminophen 500 MG tablet Commonly known as: TYLENOL Take 500 mg by mouth every 6 (six) hours as needed for mild pain or moderate pain.   aspirin EC 81 MG tablet Take 81 mg by mouth every evening.   ceFAZolin  IVPB Commonly known as: ANCEF Inject 2 g into the vein every 8 (eight) hours for 25 days. Indication:  MSSA Bacteremia First Dose: Yes Last Day of Therapy:  02/28/22  Labs - Once weekly:  CBC/D and BMP, Labs - Every other week:  ESR and CRP Method of administration: IV Push Method of administration may be changed at the discretion of home infusion pharmacist based upon assessment of the patient and/or caregiver's ability to self-administer the medication ordered.   clopidogrel 75 MG tablet Commonly known as: PLAVIX TAKE 1 TABLET BY MOUTH EVERY DAY    cyanocobalamin 1000 MCG tablet Commonly known as: VITAMIN B12 Take 1,000 mcg by mouth daily.   dapagliflozin propanediol 10 MG Tabs tablet Commonly known as: Farxiga Take 1 tablet (10 mg total) by mouth daily before breakfast.   ezetimibe-simvastatin 10-40 MG tablet Commonly known as: VYTORIN TAKE 1 TABLET BY MOUTH EVERYDAY AT BEDTIME What changed: See the new instructions.   famotidine 20 MG tablet Commonly known as: PEPCID Take 1 tablet (20 mg total) by mouth at bedtime.   ferrous sulfate 325 (65 FE) MG tablet Take 325 mg by mouth daily with breakfast.   Fish Oil 1000 MG Caps Take 1 capsule by mouth daily.   methocarbamol 500 MG tablet Commonly known as: ROBAXIN TAKE 1 TABLET (500 MG TOTAL) BY MOUTH EVERY DAY AT BEDTIME AS NEEDED FOR MUSCLE SPASM What changed: See the new instructions.   metoprolol succinate 50 MG 24 hr tablet Commonly known as: TOPROL-XL TAKE 1 TABLET BY MOUTH EVERY DAY WITH OR IMMEDIATELY FOLLOWING MEAL (NEEDS OFFICE VISIT) What changed: See the new instructions.  mirtazapine 15 MG tablet Commonly known as: REMERON Take 15 mg by mouth at bedtime.   nitroGLYCERIN 0.4 MG SL tablet Commonly known as: NITROSTAT Place 1 tablet (0.4 mg total) under the tongue every 5 (five) minutes x 3 doses as needed for chest pain.   rOPINIRole 3 MG tablet Commonly known as: REQUIP TAKE 1/2 TABLET BY MOUTH IN THE MORNING & TAKE 1 TABLET AT NIGHT What changed:  how much to take how to take this when to take this additional instructions   spironolactone 25 MG tablet Commonly known as: ALDACTONE TAKE 1/2 TABLET BY MOUTH EVERY DAY   tamsulosin 0.4 MG Caps capsule Commonly known as: FLOMAX TAKE 1 CAPSULE BY MOUTH EVERY DAY               Discharge Care Instructions  (From admission, onward)           Start     Ordered   02/03/22 0000  Change dressing on IV access line weekly and PRN  (Home infusion instructions - [facility] )         02/03/22 1315           Allergies  Allergen Reactions   Codeine Swelling    throat swells    Follow-up Information     [facility] .          [place] Follow up.   Why: [facility] Nurse will assist you with home IV antibiotics Contact information: [place], [place] [address] telephone [phone]        Erik Sanders, MD Follow up.   Specialty: Family Medicine Contact information: [place] [address] [phone]         [facility]              Follow up.   Contact information: [place] [phone]                 The results of significant diagnostics from this hospitalization (including imaging, microbiology, ancillary and laboratory) are listed below for reference.    Significant Diagnostic Studies: [place] EKG SITE RITE  Result Date: 02/03/2022 If Site Rite image not attached, placement could not be confirmed due to current cardiac rhythm.  ECHO TEE  Result Date: 02/03/2022    TRANSESOPHOGEAL ECHO REPORT   Patient Name:   Erik [name]. Date of Exam: 02/03/2022 Medical Rec #:  [id number]             Height:       66.0 in Accession #:    [id number]            Weight:       125.0 lb Date of Birth:  06/22/1940              BSA:          1.638 m Patient Age:    24 years              BP:           130/55 mmHg Patient Gender: M                     HR:           70 bpm. Exam Location:  Inpatient Procedure: Transesophageal Echo, Color Doppler, Cardiac Doppler and 3D  Echo Indications:     Bacteremia  History:         Patient has prior history of Echocardiogram examinations, most                  recent 01/30/2022. Cardiomyopathy, CAD, COPD,                  Signs/Symptoms:Murmur; Risk Factors:Dyslipidemia, Current                  Smoker and Hypertension.  Sonographer:     Johny Chess RDCS  Referring Phys:  [id number] Lily Kocher Diagnosing Phys: Gwyndolyn Kaufman MD PROCEDURE: After discussion of the risks and benefits of a TEE, an informed consent was obtained from the patient. The transesophogeal probe was passed without difficulty through the esophogus of the patient. Imaged were obtained with the patient in a left lateral decubitus position. Sedation performed by different physician. The patient was monitored while under deep sedation. Anesthestetic sedation was provided intravenously by Anesthesiology: 142m of Propofol. The patient developed no complications during the procedure.  IMPRESSIONS  1. No valvular vegetations visualized.  2. Left ventricular ejection fraction, by estimation, is 50%. The left ventricle has low normal function.  3. Right ventricular systolic function is normal. The right ventricular size is normal. There is moderately elevated pulmonary artery systolic pressure. The estimated right ventricular systolic pressure is 440.7mmHg.  4. Left atrial size was moderately dilated. No left atrial/left atrial appendage thrombus was detected.  5. Right atrial size was mildly dilated.  6. The mitral valve is degenerative. Mild to moderate mitral valve regurgitation with EROA 0.11cm2, RVol 247m Mild to moderate mitral stenosis with average mean gradient 6.23m50m at HR 62bpm. Severe mitral annular calcification most notably on the posterior MV annulus.  7. Tricuspid valve regurgitation is mild to moderate.  8. The aortic valve is tricuspid. There is mild calcification of the aortic valve. There is mild thickening of the aortic valve. Aortic valve regurgitation is mild. Aortic valve sclerosis/calcification is present, without any evidence of aortic stenosis.  9. There is Severe (Grade IV) plaque. 10. Evidence of atrial level shunting detected by color flow Doppler. 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 Agitated saline bubble  study was positive with valsalva. FINDINGS  Left Ventricle: Left ventricular ejection fraction, by estimation, is 50%. The left ventricle has low normal function. The left ventricular internal cavity size was normal in size. Right Ventricle: The right ventricular size is normal. No increase in right ventricular wall thickness. Right ventricular systolic function is normal. There is moderately elevated pulmonary artery systolic pressure. The tricuspid regurgitant velocity is 3.34 m/s, and with an assumed right atrial pressure of 3 mmHg, the estimated right ventricular systolic pressure is 47.68.0Hg. Left Atrium: Left atrial size was moderately dilated. No left atrial/left atrial appendage thrombus was detected. Right Atrium: Right atrial size was mildly dilated. Pericardium: There is no evidence of pericardial effusion. Mitral Valve: EROA 0.11cm2, RVol 83m623mhe mitral valve is degenerative in appearance. There is mild thickening of the mitral valve leaflet(s). There is mild calcification of the mitral valve leaflet(s). Severe mitral annular calcification. Mild to moderate mitral valve regurgitation. Mild to moderate mitral valve stenosis. MV peak gradient, 11.6 mmHg. The mean mitral valve gradient is 6.5 mmHg. There is no evidence of mitral valve vegetation. Tricuspid Valve: The tricuspid valve is normal in structure. Tricuspid valve regurgitation is mild to moderate. There is  no evidence of tricuspid valve vegetation. Aortic Valve: The aortic valve is tricuspid. There is mild calcification of the aortic valve. There is mild thickening of the aortic valve. Aortic valve regurgitation is mild. Aortic valve sclerosis/calcification is present, without any evidence of aortic stenosis. There is no evidence of aortic valve vegetation. Pulmonic Valve: The pulmonic valve was normal in structure. Pulmonic valve regurgitation is trivial. There is no evidence of pulmonic valve vegetation. Aorta: The aortic root is normal in  size and structure. There is severe (Grade IV) plaque. IAS/Shunts: Evidence of atrial level shunting detected by color flow Doppler. Agitated saline contrast was given intravenously to evaluate for intracardiac shunting.  MITRAL VALVE                  TRICUSPID VALVE MV Peak grad: 11.6 mmHg       TR Peak grad:   44.6 mmHg MV Mean grad: 6.5 mmHg        TR Vmax:        334.00 cm/s MV Vmax:      1.70 m/s MV Vmean:     119.5 cm/s MR Peak grad:    141.6 mmHg MR Mean grad:    81.0 mmHg MR Vmax:         595.00 cm/s MR Vmean:        419.0 cm/s MR PISA:         1.57 cm MR PISA Eff ROA: 11 mm MR PISA Radius:  0.50 cm Gwyndolyn Kaufman MD Electronically signed by Gwyndolyn Kaufman MD Signature Date/Time: 02/03/2022/9:25:01 AM    Final    US Abdomen Limited RUQ (LIVER/GB)  Result Date: 02/02/2022 CLINICAL DATA:  Transaminitis.  History of cholecystectomy. EXAM: ULTRASOUND ABDOMEN LIMITED RIGHT UPPER QUADRANT COMPARISON:  Abdominopelvic CT 01/29/2022. FINDINGS: Gallbladder: Status post cholecystectomy. Possible small amount of fluid within the cholecystectomy bed. Cholecystectomy does not appear to have been performed recently. Common bile duct: Diameter: 5 mm.  No intrahepatic biliary dilatation. Liver: Mildly increased hepatic echogenicity, most typically due to steatosis. No focal abnormality. Portal vein is patent on color Doppler imaging with normal direction of blood flow towards the liver. Other: No generalized ascites.  Aortic stent graft noted. IMPRESSION: 1. No biliary dilatation status post cholecystectomy. Possible small amount of fluid within the cholecystectomy bed, nonspecific. 2. Hepatic steatosis. Electronically Signed   By: Richardean Sale M.D.   On: 02/02/2022 12:10   DG Abd 1 View  Result Date: 02/01/2022 CLINICAL DATA:  [id number] Abdominal pain [id number] EXAM: ABDOMEN - 1 VIEW COMPARISON:  X-ray abdomen 01/30/2022, CT abdomen pelvis 01/29/2022 FINDINGS: Interval development of gaseous dilatation of the  colon. No gaseous dilatation of the small bowel. No radio-opaque calculi or other significant radiographic abnormality are seen. Aorta to bilateral iliac repair noted. IMPRESSION: Interval development of gaseous dilatation of the colon. Consider CT with intravenous contrast for further evaluation. Electronically Signed   By: Iven Finn M.D.   On: 02/01/2022 17:44   ECHOCARDIOGRAM COMPLETE  Result Date: 01/30/2022    ECHOCARDIOGRAM REPORT   Patient Name:   Erik [name]. Date of Exam: 01/30/2022 Medical Rec #:  [id number]             Height:       66.0 in Accession #:    [id number]            Weight:       121.3 lb Date of Birth:  [birth year]  BSA:          1.617 m Patient Age:    40 years              BP:           104/57 mmHg Patient Gender: M                     HR:           84 bpm. Exam Location:  Inpatient Procedure: 2D Echo Indications:    cardiomyopathy-ischemic  History:        Patient has prior history of Echocardiogram examinations, most                 recent 07/21/2020. Cardiomyopathy, CAD, COPD,                 Signs/Symptoms:Murmur; Risk Factors:Dyslipidemia.  Sonographer:    Harvie Junior Referring Phys: [phone] Silvestre Moment [facility]  Sonographer Comments: Technically difficult study due to poor echo windows. IMPRESSIONS  1. Left ventricular ejection fraction, by estimation, is 50 to 55%. The left ventricle has low normal function. The left ventricle has no regional wall motion abnormalities. There is mild left ventricular hypertrophy. Left ventricular diastolic function  could not be evaluated.  2. Right ventricular systolic function is normal. The right ventricular size is normal. There is moderately elevated pulmonary artery systolic pressure.  3. Left atrial size was moderately dilated.  4. The mitral valve is normal in structure. Mild to moderate mitral valve regurgitation. Mild mitral stenosis. Severe mitral annular calcification.  5. The aortic valve has an indeterminant number of  cusps. Aortic valve regurgitation is mild. Aortic valve sclerosis/calcification is present, without any evidence of aortic stenosis.  6. The inferior vena cava is dilated in size with <50% respiratory variability, suggesting right atrial pressure of 15 mmHg. FINDINGS  Left Ventricle: Left ventricular ejection fraction, by estimation, is 50 to 55%. The left ventricle has low normal function. The left ventricle has no regional wall motion abnormalities. The left ventricular internal cavity size was normal in size. There is mild left ventricular hypertrophy. Left ventricular diastolic function could not be evaluated due to mitral annular calcification (moderate or greater). Left ventricular diastolic function could not be evaluated. Right Ventricle: The right ventricular size is normal. Right ventricular systolic function is normal. There is moderately elevated pulmonary artery systolic pressure. The tricuspid regurgitant velocity is 2.76 m/s, and with an assumed right atrial pressure of 15 mmHg, the estimated right ventricular systolic pressure is 62.0 mmHg. Left Atrium: Left atrial size was moderately dilated. Right Atrium: Right atrial size was normal in size. Pericardium: There is no evidence of pericardial effusion. Mitral Valve: The mitral valve is normal in structure. Severe mitral annular calcification. Mild to moderate mitral valve regurgitation. Mild mitral valve stenosis. MV peak gradient, 15.5 mmHg. The mean mitral valve gradient is 5.3 mmHg. Tricuspid Valve: The tricuspid valve is normal in structure. Tricuspid valve regurgitation is trivial. No evidence of tricuspid stenosis. Aortic Valve: The aortic valve has an indeterminant number of cusps. Aortic valve regurgitation is mild. Aortic regurgitation PHT measures 342 msec. Aortic valve sclerosis/calcification is present, without any evidence of aortic stenosis. Aortic valve mean gradient measures 7.0 mmHg. Aortic valve peak gradient measures 14.3 mmHg.  Aortic valve area, by VTI measures 1.97 cm. Pulmonic Valve: The pulmonic valve was not well visualized. Pulmonic valve regurgitation is not visualized. No evidence of pulmonic stenosis. Aorta: The aortic root is normal in  size and structure. Venous: The inferior vena cava is dilated in size with less than 50% respiratory variability, suggesting right atrial pressure of 15 mmHg. IAS/Shunts: No atrial level shunt detected by color flow Doppler.  LEFT VENTRICLE PLAX 2D LVIDd:         4.50 cm      Diastology LVIDs:         3.30 cm      LV e' medial:    6.20 cm/s LV PW:         0.90 cm      LV E/e' medial:  25.0 LV IVS:        0.70 cm      LV e' lateral:   4.68 cm/s LVOT diam:     1.90 cm      LV E/e' lateral: 33.1 LV SV:         62 LV SV Index:   38 LVOT Area:     2.84 cm  LV Volumes (MOD) LV vol d, MOD A2C: 107.0 ml LV vol d, MOD A4C: 123.0 ml LV vol s, MOD A2C: 57.4 ml LV vol s, MOD A4C: 58.5 ml LV SV MOD A2C:     49.6 ml LV SV MOD A4C:     123.0 ml LV SV MOD BP:      59.3 ml RIGHT VENTRICLE RV Basal diam:  4.10 cm RV Mid diam:    3.30 cm RV S prime:     14.00 cm/s TAPSE (M-mode): 2.5 cm LEFT ATRIUM             Index        RIGHT ATRIUM           Index LA diam:        3.40 cm 2.10 cm/m   RA Area:     16.50 cm LA Vol (A2C):   69.3 ml 42.87 ml/m  RA Volume:   47.40 ml  29.32 ml/m LA Vol (A4C):   71.5 ml 44.23 ml/m LA Biplane Vol: 71.0 ml 43.92 ml/m  AORTIC VALVE                     PULMONIC VALVE AV Area (Vmax):    1.83 cm      PV Vmax:       1.38 m/s AV Area (Vmean):   1.78 cm      PV Peak grad:  7.6 mmHg AV Area (VTI):     1.97 cm AV Vmax:           189.00 cm/s AV Vmean:          119.000 cm/s AV VTI:            0.312 m AV Peak Grad:      14.3 mmHg AV Mean Grad:      7.0 mmHg LVOT Vmax:         122.00 cm/s LVOT Vmean:        74.700 cm/s LVOT VTI:          0.217 m LVOT/AV VTI ratio: 0.70 AI PHT:            342 msec  AORTA Ao Root diam: 3.40 cm MITRAL VALVE                TRICUSPID VALVE MV Area (PHT): 4.52  cm     TR Peak grad:   30.5 mmHg MV Area VTI:   1.23 cm     TR  Vmax:        276.00 cm/s MV Peak grad:  15.5 mmHg MV Mean grad:  5.3 mmHg     SHUNTS MV Vmax:       1.97 m/s     Systemic VTI:  0.22 m MV Vmean:      106.3 cm/s   Systemic Diam: 1.90 cm MV Decel Time: 168 msec MR Peak grad: 113.6 mmHg MR Vmax:      533.00 cm/s MV E velocity: 155.00 cm/s MV A velocity: 165.00 cm/s MV E/A ratio:  0.94 Kirk Ruths MD Electronically signed by Kirk Ruths MD Signature Date/Time: 01/30/2022/2:43:38 PM    Final    DG Abd Portable 1V  Result Date: 01/30/2022 CLINICAL DATA:  Small bowel obstruction. EXAM: PORTABLE ABDOMEN - 1 VIEW COMPARISON:  01/29/2022 CT and prior studies FINDINGS: A mildly distended loop of small bowel within the central abdomen is noted. No other gas-filled dilated bowel loops are present. Gas in the colon is present. Contrast in the bladder from recent CT noted. Aorta bi-iliac stent again identified. IMPRESSION: Mildly distended loop of small bowel within the central abdomen. No other dilated bowel loops identified on this examination. Electronically Signed   By: Margarette Canada M.D.   On: 01/30/2022 09:36   CT Abdomen Pelvis W Contrast  Result Date: 01/29/2022 CLINICAL DATA:  Sepsis, abdominal pain EXAM: CT ABDOMEN AND PELVIS WITH CONTRAST TECHNIQUE: Multidetector CT imaging of the abdomen and pelvis was performed using the standard protocol following bolus administration of intravenous contrast. RADIATION DOSE REDUCTION: This exam was performed according to the departmental dose-optimization program which includes automated exposure control, adjustment of the mA and/or kV according to patient size and/or use of iterative reconstruction technique. CONTRAST:  19m OMNIPAQUE IOHEXOL 350 MG/ML SOLN COMPARISON:  10/29/2021 FINDINGS: Lower chest: No acute abnormality. Coronary artery and aortic calcifications. Hepatobiliary: No focal hepatic abnormality. Gallbladder unremarkable. Pancreas: No  focal abnormality or ductal dilatation. Spleen: No focal abnormality.  Normal size. Adrenals/Urinary Tract: No adrenal abnormality. No focal renal abnormality. No stones or hydronephrosis. Urinary bladder is unremarkable. Stomach/Bowel: Dilated small bowel loops noted in the abdomen and continuing into the pelvis. Distal small bowel loops are decompressed. Cannot exclude mild distal small bowel obstruction. 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 Maximum aneurysm sac size 4.5 cm compared to 4.6 cm previously. No adenopathy Reproductive: Prostate enlargement. Other: No free fluid or free air. Musculoskeletal: No acute bony abnormality. IMPRESSION: Mildly dilated and fluid-filled small bowel loops continue to the pelvis. Distal small bowel is decompressed. Cannot exclude mild distal small bowel obstruction. Electronically Signed   By: KRolm BaptiseM.D.   On: 01/29/2022 22:41   DG Chest Port 1 View  Result Date: 01/29/2022 CLINICAL DATA:  Fever, shortness of breath EXAM: PORTABLE CHEST 1 VIEW COMPARISON:  CT chest dated 06/16/2021 FINDINGS: Lungs are clear.  No pleural effusion or pneumothorax. The heart is normal in size. Aortic stent at the aortic hiatus. IMPRESSION: No evidence of acute cardiopulmonary disease. Electronically Signed   By: SJulian HyM.D.   On: 01/29/2022 20:48    Microbiology: Recent Results (from the past 240 hour(s))  Blood Culture (routine x 2)     Status: Abnormal   Collection Time: 01/29/22  8:11 PM   Specimen: BLOOD LEFT FOREARM  Result Value Ref Range Status   Specimen Description BLOOD LEFT FOREARM  Final   Special Requests   Final    BOTTLES DRAWN AEROBIC AND ANAEROBIC Blood Culture  results may not be optimal due to an excessive volume of blood received in culture bottles   Culture  Setup Time   Final    GRAM POSITIVE COCCI IN BOTH AEROBIC AND ANAEROBIC BOTTLES CRITICAL RESULT CALLED TO, READ BACK BY AND VERIFIED WITH: PHARMD L  BELL [id number] AT 1101 AM BY CM Performed at [facility], [place] [address], [place], [place] [address]    Culture STAPHYLOCOCCUS AUREUS (A)  Final   Report Status 02/01/2022 FINAL  Final   Organism ID, Bacteria STAPHYLOCOCCUS AUREUS  Final      Susceptibility   Staphylococcus aureus - MIC*    CIPROFLOXACIN <=0.5 SENSITIVE Sensitive     ERYTHROMYCIN RESISTANT Resistant     GENTAMICIN <=0.5 SENSITIVE Sensitive     OXACILLIN <=0.25 SENSITIVE Sensitive     TETRACYCLINE <=1 SENSITIVE Sensitive     VANCOMYCIN <=0.5 SENSITIVE Sensitive     TRIMETH/SULFA <=10 SENSITIVE Sensitive     CLINDAMYCIN RESISTANT Resistant     RIFAMPIN <=0.5 SENSITIVE Sensitive     Inducible Clindamycin POSITIVE Resistant     * STAPHYLOCOCCUS AUREUS  Blood Culture ID Panel (Reflexed)     Status: Abnormal   Collection Time: 01/29/22  8:11 PM  Result Value Ref Range Status   Enterococcus faecalis NOT DETECTED NOT DETECTED Final   Enterococcus Faecium NOT DETECTED NOT DETECTED Final   Listeria monocytogenes NOT DETECTED NOT DETECTED Final   Staphylococcus species DETECTED (A) NOT DETECTED Final    Comment: CRITICAL RESULT CALLED TO, READ BACK BY AND VERIFIED WITH: PHARMD L BELL [id number] AT 1101 AM BY CM    Staphylococcus aureus (BCID) DETECTED (A) NOT DETECTED Final    Comment: CRITICAL RESULT CALLED TO, READ BACK BY AND VERIFIED WITH: PHARMD L BELL [id number] AT 1101 AM BY CM    Staphylococcus epidermidis NOT DETECTED NOT DETECTED Final   Staphylococcus lugdunensis NOT DETECTED NOT DETECTED Final   Streptococcus species NOT DETECTED NOT DETECTED Final   Streptococcus agalactiae NOT DETECTED NOT DETECTED Final   Streptococcus pneumoniae NOT DETECTED NOT DETECTED Final   Streptococcus pyogenes NOT DETECTED NOT DETECTED Final   A.calcoaceticus-baumannii NOT DETECTED NOT DETECTED Final   Bacteroides fragilis NOT DETECTED NOT DETECTED Final   Enterobacterales NOT DETECTED NOT DETECTED Final   Enterobacter cloacae  complex NOT DETECTED NOT DETECTED Final   Escherichia coli NOT DETECTED NOT DETECTED Final   Klebsiella aerogenes NOT DETECTED NOT DETECTED Final   Klebsiella oxytoca NOT DETECTED NOT DETECTED Final   Klebsiella pneumoniae NOT DETECTED NOT DETECTED Final   Proteus species NOT DETECTED NOT DETECTED Final   Salmonella species NOT DETECTED NOT DETECTED Final   Serratia marcescens NOT DETECTED NOT DETECTED Final   Haemophilus influenzae NOT DETECTED NOT DETECTED Final   Neisseria meningitidis NOT DETECTED NOT DETECTED Final   Pseudomonas aeruginosa NOT DETECTED NOT DETECTED Final   Stenotrophomonas maltophilia NOT DETECTED NOT DETECTED Final   Candida albicans NOT DETECTED NOT DETECTED Final   Candida auris NOT DETECTED NOT DETECTED Final   Candida glabrata NOT DETECTED NOT DETECTED Final   Candida krusei NOT DETECTED NOT DETECTED Final   Candida parapsilosis NOT DETECTED NOT DETECTED Final   Candida tropicalis NOT DETECTED NOT DETECTED Final   Cryptococcus neoformans/gattii NOT DETECTED NOT DETECTED Final   Meth resistant mecA/C and MREJ NOT DETECTED NOT DETECTED Final    Comment: Performed at [facility], [address] [place], [place] [address]  Blood Culture (routine x 2)  Status: Abnormal   Collection Time: 01/29/22  8:12 PM   Specimen: BLOOD RIGHT FOREARM  Result Value Ref Range Status   Specimen Description BLOOD RIGHT FOREARM  Final   Special Requests   Final    BOTTLES DRAWN AEROBIC AND ANAEROBIC Blood Culture results may not be optimal due to an excessive volume of blood received in culture bottles   Culture  Setup Time   Final    GRAM POSITIVE COCCI IN BOTH AEROBIC AND ANAEROBIC BOTTLES CRITICAL VALUE NOTED.  VALUE IS CONSISTENT WITH PREVIOUSLY REPORTED AND CALLED VALUE.    Culture (A)  Final    STAPHYLOCOCCUS AUREUS SUSCEPTIBILITIES PERFORMED ON PREVIOUS CULTURE WITHIN THE LAST 5 DAYS. Performed at [facility], [place] [address], [place], [place]  [address]    Report Status 02/01/2022 FINAL  Final  Resp Panel by RT-PCR (Flu A&B, Covid) Anterior Nasal Swab     Status: None   Collection Time: 01/29/22  8:15 PM   Specimen: Anterior Nasal Swab  Result Value Ref Range Status   SARS Coronavirus 2 by RT PCR NEGATIVE NEGATIVE Final    Comment: (NOTE) SARS-CoV-2 target nucleic acids are NOT DETECTED.  The SARS-CoV-2 RNA is generally detectable in upper respiratory specimens during the acute phase of infection. The lowest concentration of SARS-CoV-2 viral copies this assay can detect is 138 copies/mL. A negative result does not preclude SARS-Cov-2 infection and should not be used as the sole basis for treatment or other patient management decisions. A negative result may occur with  improper specimen collection/handling, submission of specimen other than nasopharyngeal swab, presence of viral mutation(s) within the areas targeted by this assay, and inadequate number of viral copies(<138 copies/mL). A negative result must be combined with clinical observations, patient history, and epidemiological information. The expected result is Negative.  Fact Sheet for Patients:  EntrepreneurPulse.com.au  Fact Sheet for Healthcare Providers:  IncredibleEmployment.be  This test is no t yet approved or cleared by the [place] FDA and  has been authorized for detection and/or diagnosis of SARS-CoV-2 by FDA under an Emergency Use Authorization (EUA). This EUA will remain  in effect (meaning this test can be used) for the duration of the COVID-19 declaration under Section 564(b)(1) of the Act, 21 U.S.C.section 360bbb-3(b)(1), unless the authorization is terminated  or revoked sooner.       Influenza A by PCR NEGATIVE NEGATIVE Final   Influenza B by PCR NEGATIVE NEGATIVE Final    Comment: (NOTE) The Xpert Xpress SARS-CoV-2/FLU/RSV plus assay is intended as an aid in the diagnosis of influenza from  Nasopharyngeal swab specimens and should not be used as a sole basis for treatment. Nasal washings and aspirates are unacceptable for Xpert Xpress SARS-CoV-2/FLU/RSV testing.  Fact Sheet for Patients: EntrepreneurPulse.com.au  Fact Sheet for Healthcare Providers: IncredibleEmployment.be  This test is not yet approved or cleared by the [place] FDA and has been authorized for detection and/or diagnosis of SARS-CoV-2 by FDA under an Emergency Use Authorization (EUA). This EUA will remain in effect (meaning this test can be used) for the duration of the COVID-19 declaration under Section 564(b)(1) of the Act, 21 U.S.C. section 360bbb-3(b)(1), unless the authorization is terminated or revoked.  Performed at [facility], [place] [address] [place], [place] [address]   Respiratory (~20 pathogens) panel by PCR     Status: None   Collection Time: 01/29/22  8:15 PM   Specimen: Nasopharyngeal Swab; Respiratory  Result Value Ref Range Status  Adenovirus NOT DETECTED NOT DETECTED Final   Coronavirus 229E NOT DETECTED NOT DETECTED Final    Comment: (NOTE) The Coronavirus on the Respiratory Panel, DOES NOT test for the novel  Coronavirus (2019 nCoV)    Coronavirus HKU1 NOT DETECTED NOT DETECTED Final   Coronavirus NL63 NOT DETECTED NOT DETECTED Final   Coronavirus OC43 NOT DETECTED NOT DETECTED Final   Metapneumovirus NOT DETECTED NOT DETECTED Final   Rhinovirus / Enterovirus NOT DETECTED NOT DETECTED Final   Influenza A NOT DETECTED NOT DETECTED Final   Influenza B NOT DETECTED NOT DETECTED Final   Parainfluenza Virus 1 NOT DETECTED NOT DETECTED Final   Parainfluenza Virus 2 NOT DETECTED NOT DETECTED Final   Parainfluenza Virus 3 NOT DETECTED NOT DETECTED Final   Parainfluenza Virus 4 NOT DETECTED NOT DETECTED Final   Respiratory Syncytial Virus NOT DETECTED NOT DETECTED Final   Bordetella pertussis NOT DETECTED NOT DETECTED Final    Bordetella Parapertussis NOT DETECTED NOT DETECTED Final   Chlamydophila pneumoniae NOT DETECTED NOT DETECTED Final   Mycoplasma pneumoniae NOT DETECTED NOT DETECTED Final    Comment: Performed at [facility], [place] [address], [place], [place] [address]  Urine Culture     Status: Abnormal   Collection Time: 01/29/22  8:32 PM   Specimen: In/Out Cath Urine  Result Value Ref Range Status   Specimen Description IN/OUT CATH URINE  Final   Special Requests   Final    NONE Performed at [facility], [place] [address] [place], [place] [address]    Culture (A)  Final    200 COLONIES/mL ENTEROCOCCUS FAECALIS 500 COLONIES/mL STAPHYLOCOCCUS HAEMOLYTICUS    Report Status 02/01/2022 FINAL  Final   Organism ID, Bacteria STAPHYLOCOCCUS HAEMOLYTICUS (A)  Final   Organism ID, Bacteria ENTEROCOCCUS FAECALIS (A)  Final      Susceptibility   Enterococcus faecalis - MIC*    AMPICILLIN <=2 SENSITIVE Sensitive     NITROFURANTOIN <=16 SENSITIVE Sensitive     VANCOMYCIN 1 SENSITIVE Sensitive     * 200 COLONIES/mL ENTEROCOCCUS FAECALIS   Staphylococcus haemolyticus - MIC*    CIPROFLOXACIN <=0.5 SENSITIVE Sensitive     GENTAMICIN <=0.5 SENSITIVE Sensitive     NITROFURANTOIN <=16 SENSITIVE Sensitive     OXACILLIN <=0.25 SENSITIVE Sensitive     TETRACYCLINE <=1 SENSITIVE Sensitive     VANCOMYCIN <=0.5 SENSITIVE Sensitive     TRIMETH/SULFA <=10 SENSITIVE Sensitive     CLINDAMYCIN <=0.25 SENSITIVE Sensitive     RIFAMPIN <=0.5 SENSITIVE Sensitive     Inducible Clindamycin NEGATIVE Sensitive     * 500 COLONIES/mL STAPHYLOCOCCUS HAEMOLYTICUS  MRSA Next Gen by PCR, Nasal     Status: None   Collection Time: 01/30/22  1:06 AM   Specimen: Nasal Mucosa; Nasal Swab  Result Value Ref Range Status   MRSA by PCR Next Gen NOT DETECTED NOT DETECTED Final    Comment: (NOTE) The GeneXpert MRSA Assay (FDA approved for NASAL specimens only), is one component of a comprehensive MRSA colonization  surveillance program. It is not intended to diagnose MRSA infection nor to guide or monitor treatment for MRSA infections. Test performance is not FDA approved in patients less than 79 years old. Performed at [facility], [place] [address], [place], [place] [address]   Culture, blood (Routine X 2) w Reflex to ID Panel     Status: None (Preliminary result)   Collection Time: 01/31/22 11:01 AM   Specimen: BLOOD  Result Value  Ref Range Status   Specimen Description BLOOD RIGHT ANTECUBITAL  Final   Special Requests   Final    BOTTLES DRAWN AEROBIC AND ANAEROBIC Blood Culture adequate volume   Culture   Final    NO GROWTH 3 DAYS Performed at [facility], [address] [place], [place] [address]    Report Status PENDING  Incomplete  Culture, blood (Routine X 2) w Reflex to ID Panel     Status: None (Preliminary result)   Collection Time: 01/31/22 11:06 AM   Specimen: BLOOD RIGHT WRIST  Result Value Ref Range Status   Specimen Description BLOOD RIGHT WRIST  Final   Special Requests   Final    BOTTLES DRAWN AEROBIC AND ANAEROBIC Blood Culture adequate volume   Culture   Final    NO GROWTH 3 DAYS Performed at [facility], [place] [address] [place], [place] [address]    Report Status PENDING  Incomplete     Labs: Basic Metabolic Panel: Recent Labs  Lab 01/30/22 0129 01/31/22 0518 02/01/22 0212 02/02/22 0316 02/03/22 0536  NA 135 133* 133* 135 137  K 4.0 4.9 4.4 3.8 3.8  CL 103 100 106 104 103  CO2 18* 18* 20* 23 22  GLUCOSE 105* 92 107* 121* 100*  BUN 26* 44* 58* 56* 43*  CREATININE 1.37* 2.01* 1.80* 1.33* 1.05  CALCIUM 8.3* 7.7* 7.2* 7.6* 7.5*  MG 1.7 2.3  --   --  2.4  PHOS 2.9 4.5  --   --   --    Liver Function Tests: Recent Labs  Lab 01/29/22 2011 02/02/22 0316 02/03/22 0536  AST 16 688* 338*  ALT 9 387* 177*  ALKPHOS 52 86 87  BILITOT 0.5 0.5 0.5  PROT 6.3* 5.4* 5.1*  ALBUMIN 2.6* 2.0* 2.0*   Recent Labs  Lab 01/29/22 2011  LIPASE 29    No results for input(s): "AMMONIA" in the last 168 hours. CBC: Recent Labs  Lab 01/29/22 2011 01/30/22 0129 01/31/22 0518 01/31/22 1102 02/01/22 0212 02/02/22 0316 02/03/22 0536  WBC 7.9 20.1* 14.2*  --  10.5 9.2 8.8  NEUTROABS 7.5  --   --   --   --  7.3 6.6  HGB 9.0* 8.4* 7.7*  --  7.6* 7.7* 7.6*  HCT 27.0* 26.1* 24.3*  --  23.7* 23.1* 23.2*  MCV 89.4 90.6 91.7  --  90.1 86.5 87.2  PLT 249 237 188 167 137* 110* 109*   Cardiac Enzymes: No results for input(s): "CKTOTAL", "CKMB", "CKMBINDEX", "TROPONINI" in the last 168 hours. BNP: BNP (last 3 results) Recent Labs    11/27/21 1529 02/03/22 0536  BNP 308.8* 2,061.5*    ProBNP (last 3 results) No results for input(s): "PROBNP" in the last 8760 hours.  CBG: Recent Labs  Lab 01/29/22 2005 01/30/22 0102 01/30/22 0336 01/31/22 0719  GLUCAP 128* 111* 101* 103*       Signed:  Desma Maxim MD.  [facility] 02/03/2022, 3:57 PM

## 2022-02-03 NOTE — Progress Notes (Signed)
E[name] for Infectious Disease    Date of Admission:  01/29/2022   Total days of antibiotics 5                           ID: Erik [name]. is a 81 y.o. male with  MSSA bacteremia of unclear source Principal Problem:   Shock ([place]) Active Problems:   Protein-calorie malnutrition, severe   MSSA bacteremia    Subjective: Afebrile, and underwent TEE this morning which did not show any vegetations    Medications:   aspirin  81 mg Oral Daily   Chlorhexidine Gluconate Cloth  6 each Topical Daily   clopidogrel  75 mg Oral Daily   ezetimibe  10 mg Oral Daily   famotidine  20 mg Oral QHS   feeding supplement  237 mL Oral TID BM   heparin injection (subcutaneous)  5,000 Units Subcutaneous Q8H   mirtazapine  15 mg Oral QHS   multivitamin with minerals  1 tablet Oral Daily   rOPINIRole  1.5 mg Oral Daily   rOPINIRole  3 mg Oral QHS   tamsulosin  0.4 mg Oral Daily    Objective: Vital signs in last 24 hours: Temp:  [98 F (36.7 C)-99 F (37.2 C)] 98 F (36.7 C) (11/22 0836) Pulse Rate:  [58-90] 78 (11/22 0944) Resp:  [19-27] 19 (11/22 0944) BP: (122-153)/(51-62) 150/55 (11/22 0944) SpO2:  [91 %-96 %] 96 % (11/22 0944)  Physical Exam  Constitutional: He is oriented to person, place, and time. He appears under-developed and mal-nourished-his stated age. No distress.  HENT:  Mouth/Throat: Oropharynx is clear and moist. No oropharyngeal exudate.  Cardiovascular: Normal rate, regular rhythm and normal heart sounds. Exam reveals no gallop and no friction rub.  No murmur heard.  Pulmonary/Chest: Effort normal and breath sounds normal. No respiratory distress. He has no wheezes.  Abdominal: Soft. Bowel sounds are normal. He exhibits no distension. There is no tenderness.  Lymphadenopathy:  He has no cervical adenopathy.  Neurological: He is alert and oriented to person, place, and time.  Skin: Skin is warm and dry. No rash noted. No erythema.  Psychiatric: He  has a normal mood and affect. His behavior is normal.    Lab Results Recent Labs    02/02/22 0316 02/03/22 0536  WBC 9.2 8.8  HGB 7.7* 7.6*  HCT 23.1* 23.2*  NA 135 137  K 3.8 3.8  CL 104 103  CO2 23 22  BUN 56* 43*  CREATININE 1.33* 1.05   Liver Panel Recent Labs    02/02/22 0316 02/03/22 0536  PROT 5.4* 5.1*  ALBUMIN 2.0* 2.0*  AST 688* 338*  ALT 387* 177*  ALKPHOS 86 87  BILITOT 0.5 0.5   Sedimentation Rate No results for input(s): "ESRSEDRATE" in the last 72 hours. C-Reactive Protein Recent Labs    02/03/22 0536  CRP 7.0*    Microbiology: Blood cx 11/19 - NGTD Blood cx 11/17- MSSA Studies/Results: [place] EKG SITE RITE  Result Date: 02/03/2022 If Site Rite image not attached, placement could not be confirmed due to current cardiac rhythm.  ECHO TEE  Result Date: 02/03/2022    TRANSESOPHOGEAL ECHO REPORT   Patient Name:   Erik [name]. Date of Exam: 02/03/2022 Medical Rec #:  [id number]             Height:       66.0 in Accession #:    [id number]  Weight:       125.0 lb Date of Birth:  [birth year]              BSA:          1.638 m Patient Age:    68 years              BP:           130/55 mmHg Patient Gender: M                     HR:           70 bpm. Exam Location:  Inpatient Procedure: Transesophageal Echo, Color Doppler, Cardiac Doppler and 3D Echo Indications:     Bacteremia  History:         Patient has prior history of Echocardiogram examinations, most                  recent 01/30/2022. Cardiomyopathy, CAD, COPD,                  Signs/Symptoms:Murmur; Risk Factors:Dyslipidemia, Current                  Smoker and Hypertension.  Sonographer:     Johny Chess RDCS Referring Phys:  [id number] Lily Kocher Diagnosing Phys: Gwyndolyn Kaufman MD PROCEDURE: After discussion of the risks and benefits of a TEE, an informed consent was obtained from the patient. The transesophogeal probe was passed without difficulty through the esophogus of the  patient. Imaged were obtained with the patient in a left lateral decubitus position. Sedation performed by different physician. The patient was monitored while under deep sedation. Anesthestetic sedation was provided intravenously by Anesthesiology: '143mg'$  of Propofol. The patient developed no complications during the procedure.  IMPRESSIONS  1. No valvular vegetations visualized.  2. Left ventricular ejection fraction, by estimation, is 50%. The left ventricle has low normal function.  3. Right ventricular systolic function is normal. The right ventricular size is normal. There is moderately elevated pulmonary artery systolic pressure. The estimated right ventricular systolic pressure is 34.1 mmHg.  4. Left atrial size was moderately dilated. No left atrial/left atrial appendage thrombus was detected.  5. Right atrial size was mildly dilated.  6. The mitral valve is degenerative. Mild to moderate mitral valve regurgitation with EROA 0.11cm2, RVol 88m. Mild to moderate mitral stenosis with average mean gradient 6.537mg at HR 62bpm. Severe mitral annular calcification most notably on the posterior MV annulus.  7. Tricuspid valve regurgitation is mild to moderate.  8. The aortic valve is tricuspid. There is mild calcification of the aortic valve. There is mild thickening of the aortic valve. Aortic valve regurgitation is mild. Aortic valve sclerosis/calcification is present, without any evidence of aortic stenosis.  9. There is Severe (Grade IV) plaque. 10. Evidence of atrial level shunting detected by color flow Doppler. 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 Agitated saline bubble study was positive with valsalva. FINDINGS  Left Ventricle: Left ventricular ejection fraction, by estimation, is 50%. The left ventricle has low normal function. The left ventricular internal cavity size was normal in size. Right Ventricle: The right ventricular size is normal.  No increase in right ventricular wall thickness. Right ventricular systolic function is normal. There is moderately elevated pulmonary artery systolic pressure. The tricuspid regurgitant velocity is 3.34 m/s, and with an assumed right atrial pressure of 3 mmHg, the estimated right ventricular systolic pressure  is 47.6 mmHg. Left Atrium: Left atrial size was moderately dilated. No left atrial/left atrial appendage thrombus was detected. Right Atrium: Right atrial size was mildly dilated. Pericardium: There is no evidence of pericardial effusion. Mitral Valve: EROA 0.11cm2, RVol 51m. The mitral valve is degenerative in appearance. There is mild thickening of the mitral valve leaflet(s). There is mild calcification of the mitral valve leaflet(s). Severe mitral annular calcification. Mild to moderate mitral valve regurgitation. Mild to moderate mitral valve stenosis. MV peak gradient, 11.6 mmHg. The mean mitral valve gradient is 6.5 mmHg. There is no evidence of mitral valve vegetation. Tricuspid Valve: The tricuspid valve is normal in structure. Tricuspid valve regurgitation is mild to moderate. There is no evidence of tricuspid valve vegetation. Aortic Valve: The aortic valve is tricuspid. There is mild calcification of the aortic valve. There is mild thickening of the aortic valve. Aortic valve regurgitation is mild. Aortic valve sclerosis/calcification is present, without any evidence of aortic stenosis. There is no evidence of aortic valve vegetation. Pulmonic Valve: The pulmonic valve was normal in structure. Pulmonic valve regurgitation is trivial. There is no evidence of pulmonic valve vegetation. Aorta: The aortic root is normal in size and structure. There is severe (Grade IV) plaque. IAS/Shunts: Evidence of atrial level shunting detected by color flow Doppler. Agitated saline contrast was given intravenously to evaluate for intracardiac shunting.  MITRAL VALVE                  TRICUSPID VALVE MV Peak grad:  11.6 mmHg       TR Peak grad:   44.6 mmHg MV Mean grad: 6.5 mmHg        TR Vmax:        334.00 cm/s MV Vmax:      1.70 m/s MV Vmean:     119.5 cm/s MR Peak grad:    141.6 mmHg MR Mean grad:    81.0 mmHg MR Vmax:         595.00 cm/s MR Vmean:        419.0 cm/s MR PISA:         1.57 cm MR PISA Eff ROA: 11 mm MR PISA Radius:  0.50 cm HGwyndolyn KaufmanMD Electronically signed by HGwyndolyn KaufmanMD Signature Date/Time: 02/03/2022/9:25:01 AM    Final    [place]dom[place] Limited RUQ (LIVER/GB)  Result Date: 02/02/2022 CLINICAL DATA:  Transaminitis.  History of cholecystectomy. EXAM: ULTRASOUND ABDOMEN LIMITED RIGHT UPPER QUADRANT COMPARISON:  Abdominopelvic CT 01/29/2022. FINDINGS: Gallbladder: Status post cholecystectomy. Possible small amount of fluid within the cholecystectomy bed. Cholecystectomy does not appear to have been performed recently. Common bile duct: Diameter: 5 mm.  No intrahepatic biliary dilatation. Liver: Mildly increased hepatic echogenicity, most typically due to steatosis. No focal abnormality. Portal vein is patent on color Doppler imaging with normal direction of blood flow towards the liver. Other: No generalized ascites.  Aortic stent graft noted. IMPRESSION: 1. No biliary dilatation status post cholecystectomy. Possible small amount of fluid within the cholecystectomy bed, nonspecific. 2. Hepatic steatosis. Electronically Signed   By: WRichardean SaleM.D.   On: 02/02/2022 12:10   DG Abd 1 View  Result Date: 02/01/2022 CLINICAL DATA:  [id number] pain [id number]: ABDOMEN - 1 VIEW COMPARISON:  X-ray abdomen 01/30/2022, CT abdomen pelvis 01/29/2022 FINDINGS: Interval development of gaseous dilatation of the colon. No gaseous dilatation of the small bowel. No radio-opaque calculi or other significant radiographic abnormality are seen. Aorta to bilateral iliac repair noted. IMPRESSION: Interval  development of gaseous dilatation of the colon. Consider CT with intravenous contrast for  further evaluation. Electronically Signed   By: Iven Finn M.D.   On: 02/01/2022 17:44     Assessment/Plan: MSSA bacteremia = ruled out endocarditis. Dr Si Raider Reached out to radiology and felt no signs of infection for aortic graft. Will plan to treat for 4 wk and follow up in the ID clinic  Aki = improved and closer back to baseline  Transaminitis = continues to improve. Suspect still related from shock liver --------------------- Diagnosis: bacteremia  Culture Result: MSSA  Allergies  Allergen Reactions   Codeine Swelling    throat swells    OPAT Orders Discharge antibiotics to be given via PICC line Discharge antibiotics: Per pharmacy protocol cefazolin indicated) Duration: 4 wk End Date: Feb 28, 2022  [place] Per Protocol:  Home health RN for IV administration and teaching; PICC line care and labs.    Labs weekly while on IV antibiotics: _x_ CBC with differential _x_ BMP   _x_ Please pull PIC at completion of IV antibiotics __ Please leave PIC in place until doctor has seen patient or been notified  Fax weekly labs to [phone]  Clinic Follow Up Appt: In 4 wk  @ [place] for Infectious Diseases Pager: [phone]  02/03/2022, 1:01 PM

## 2022-02-03 NOTE — Anesthesia Preprocedure Evaluation (Signed)
Anesthesia Evaluation  Patient identified by MRN, date of birth, ID band Patient awake    Reviewed: Allergy & Precautions, NPO status , Patient's Chart, lab work & pertinent test results  Airway Mallampati: I       Dental  (+) Caps, Poor Dentition   Pulmonary Current Smoker and Patient abstained from smoking.   Pulmonary exam normal        Cardiovascular hypertension, Pt. on home beta blockers + CAD, + Past MI and +CHF  Normal cardiovascular exam     Neuro/Psych    GI/Hepatic   Endo/Other    Renal/GU      Musculoskeletal   Abdominal Normal abdominal exam  (+)   Peds  Hematology   Anesthesia Other Findings MPRESSIONS     1. Left ventricular ejection fraction, by estimation, is 50 to 55%. The  left ventricle has low normal function. The left ventricle has no regional  wall motion abnormalities. There is mild left ventricular hypertrophy.  Left ventricular diastolic function   could not be evaluated.   2. Right ventricular systolic function is normal. The right ventricular  size is normal. There is moderately elevated pulmonary artery systolic  pressure.   3. Left atrial size was moderately dilated.   4. The mitral valve is normal in structure. Mild to moderate mitral valve  regurgitation. Mild mitral stenosis. Severe mitral annular calcification.   5. The aortic valve has an indeterminant number of cusps. Aortic valve  regurgitation is mild. Aortic valve sclerosis/calcification is present,  without any evidence of aortic stenosis.   6. The inferior vena cava is dilated in size with <50% respiratory  variability, suggesting right atrial pressure of 15 mmHg.   FINDINGS   Left Ventricle: Left ventricular ejection fraction, by estimation, is 50  t    Reproductive/Obstetrics                             Anesthesia Physical Anesthesia Plan  ASA: 3  Anesthesia Plan: MAC   Post-op  Pain Management:    Induction: Intravenous  PONV Risk Score and Plan: Propofol infusion and TIVA  Airway Management Planned: Natural Airway and Mask  Additional Equipment: 3D TEE  Intra-op Plan:   Post-operative Plan:   Informed Consent: I have reviewed the patients History and Physical, chart, labs and discussed the procedure including the risks, benefits and alternatives for the proposed anesthesia with the patient or authorized representative who has indicated his/her understanding and acceptance.     Dental advisory given  Plan Discussed with: CRNA  Anesthesia Plan Comments:        Anesthesia Quick Evaluation

## 2022-02-03 NOTE — Transfer of Care (Signed)
Immediate Anesthesia Transfer of Care Note  Patient: Erik [name].  Procedure(s) Performed: TRANSESOPHAGEAL ECHOCARDIOGRAM (TEE) BUBBLE STUDY  Patient Location: Endoscopy Unit  Anesthesia Type:MAC  Level of Consciousness: drowsy  Airway & Oxygen Therapy: Patient Spontanous Breathing  Post-op Assessment: Report given to RN  Post vital signs: Reviewed and stable  Last Vitals:  Vitals Value Taken Time  BP 125/64   Temp    Pulse 63   Resp 27   SpO2 93     Last Pain:  Vitals:   02/03/22 0712  TempSrc: Temporal  PainSc: 0-No pain      Patients Stated Pain Goal: 3 (33/54/56 2563)  Complications: No notable events documented.

## 2022-02-03 NOTE — Progress Notes (Signed)
Peripherally Inserted Central Catheter Placement  The IV Nurse has discussed with the patient and/or persons authorized to consent for the patient, the purpose of this procedure and the potential benefits and risks involved with this procedure.  The benefits include less needle sticks, lab draws from the catheter, and the patient may be discharged home with the catheter. Risks include, but not limited to, infection, bleeding, blood clot (thrombus formation), and puncture of an artery; nerve damage and irregular heartbeat and possibility to perform a PICC exchange if needed/ordered by physician.  Alternatives to this procedure were also discussed.  Bard Power PICC patient education guide, fact sheet on infection prevention and patient information card has been provided to patient /or left at bedside.  Consent signed by wife due to patient getting anesthesia <24 hours prior.  PICC Placement Documentation  PICC Single Lumen 87/57/97 Right Basilic 37 cm 0 cm (Active)  Indication for Insertion or Continuance of Line Prolonged intravenous therapies;Home intravenous therapies (PICC only) 02/03/22 1244  Exposed Catheter (cm) 0 cm 02/03/22 1244  Site Assessment Clean, Dry, Intact 02/03/22 1244  Line Status Flushed;Saline locked;Blood return noted 02/03/22 1244  Dressing Type Transparent;Securing device 02/03/22 1244  Dressing Status Antimicrobial disc in place;Clean, Dry, Intact 02/03/22 1244  Safety Lock Not Applicable 28/20/60 1561  Line Care Connections checked and tightened 02/03/22 1244  Line Adjustment (NICU/IV Team Only) No 02/03/22 1244  Dressing Intervention New dressing 02/03/22 1244  Dressing Change Due 02/10/22 02/03/22 [name], [name] 02/03/2022, 12:45 PM

## 2022-02-03 NOTE — Progress Notes (Signed)
  Echocardiogram Echocardiogram Transesophageal has been performed.  Erik [name] 02/03/2022, 8:53 AM

## 2022-02-03 NOTE — TOC Progression Note (Addendum)
Transition of Care (TOC) - Progression Note    Patient Details  Name: Erik [name]. MRN: [id number] Date of Birth: November 05, 1940  Transition of Care [facility]) CM/SW Contact  Royston Bake, RN,MHA,CCM Transition of Care Supervisor Phone Number: [phone] 02/03/2022, 1:24 PM  Clinical Narrative:    Patient to go home on IV antibiotics. HHC arranged with [facility] by previous RNCM. Cheryl with [facility] called and updated. Due to the holiday (Thanksgiving Day), they cannot see the patient until the weekend. Attending MD made aware. Carolynn Sayers RN Infusionist  with [facility] called. She will see the patient and spouse for teaching. TOC will continue to follow for home IV abx.  2:09 pm - Per Carolynn Sayers ,[facility] services with [facility] for home IV supplies will be $140 per week.. [facility] contacted, Medicare will cover the cost with no additional charges to the patient and they can see the patient tomorrow. Talked to spouse and she is agreeable to change [place] agencies to [facility] for [facility]: [place] Rhonda W. Carolynn Sayers updated and will see the patient today for teaching and IV meds will be mixed today for possible discharge home today or tomorrow. B Japhet Morgenthaler RN,MHA,CCM  2:18 pm - PICC in place. Home IV abx teaching to began at [place] IV abx are being mixed for 10 pm dose tonight. Carolynn Sayers is in collaboration with Caryl Pina with [facility] for coordination of care. B Curlie Sittner RN,MHA,CCM  4:50 pm- Talked to Carolynn Sayers, everything is in place for patient to be discharged home today on home IV abx. Pam also updated bedside Nurse. Mindi Slicker RN,MHA,CCM   Expected Discharge Plan: [place] Barriers to Discharge: Continued Medical Work up  Expected Discharge Plan and Services Expected Discharge Plan: [place] Choice: [place] arrangements for the past 2 months: Single Family Home                           HH Arranged: RN,  PT [facility] Agency: [place] Date [place]: 02/01/22 Time [place]: 80 Representative spoke with at [place]: [place] Determinants of Health ([place]) Interventions    Readmission Risk Interventions     No data to display

## 2022-02-03 NOTE — Interval H&P Note (Signed)
History and Physical Interval Note:  02/03/2022 8:02 AM  Erik [name].  has presented today for surgery, with the diagnosis of BACTERIMIA.  The various methods of treatment have been discussed with the patient and family. After consideration of risks, benefits and other options for treatment, the patient has consented to  Procedure(s): TRANSESOPHAGEAL ECHOCARDIOGRAM (TEE) (N/A) as a surgical intervention.  The patient's history has been reviewed, patient examined, no change in status, stable for surgery.  I have reviewed the patient's chart and labs.  Questions were answered to the patient's satisfaction.     Freada Bergeron

## 2022-02-03 NOTE — Progress Notes (Signed)
Pt ordered to discharge home. AVS reviewed with pt and family. Education provided as needed. Patient verbalized understanding. All questions answered.

## 2022-02-03 NOTE — Progress Notes (Signed)
PHARMACY CONSULT NOTE FOR:  OUTPATIENT  PARENTERAL ANTIBIOTIC THERAPY (OPAT)  Indication: MSSA bacteremia Regimen: Cefazolin 2 gm IV Q 8 hours  End date: 02/28/22   IV antibiotic discharge orders are pended. To discharging provider:  please sign these orders via discharge navigator,  Select New Orders & click on the button choice - Manage This Unsigned Work.     Thank you for allowing pharmacy to be a part of this patient's care.  Jimmy Footman, PharmD, BCPS, [place] [facility] Clinical Pharmacist Phone: [phone] 02/03/2022, 11:27 AM

## 2022-02-05 ENCOUNTER — Telehealth: Payer: Self-pay

## 2022-02-05 ENCOUNTER — Emergency Department (HOSPITAL_COMMUNITY)
Admission: EM | Admit: 2022-02-05 | Discharge: 2022-02-06 | Disposition: A | Discharge status: Home / Self Care | Payer: Medicare Other | Attending: Emergency Medicine | Admitting: Emergency Medicine

## 2022-02-05 ENCOUNTER — Emergency Department (HOSPITAL_COMMUNITY): Payer: Medicare Other

## 2022-02-05 ENCOUNTER — Other Ambulatory Visit: Payer: Self-pay

## 2022-02-05 DIAGNOSIS — I2510 Atherosclerotic heart disease of native coronary artery without angina pectoris: Secondary | ICD-10-CM | POA: Insufficient documentation

## 2022-02-05 DIAGNOSIS — I5020 Unspecified systolic (congestive) heart failure: Secondary | ICD-10-CM | POA: Diagnosis not present

## 2022-02-05 DIAGNOSIS — I255 Ischemic cardiomyopathy: Secondary | ICD-10-CM | POA: Diagnosis not present

## 2022-02-05 DIAGNOSIS — G2581 Restless legs syndrome: Secondary | ICD-10-CM | POA: Diagnosis not present

## 2022-02-05 DIAGNOSIS — R011 Cardiac murmur, unspecified: Secondary | ICD-10-CM | POA: Diagnosis not present

## 2022-02-05 DIAGNOSIS — N400 Enlarged prostate without lower urinary tract symptoms: Secondary | ICD-10-CM | POA: Diagnosis not present

## 2022-02-05 DIAGNOSIS — I6529 Occlusion and stenosis of unspecified carotid artery: Secondary | ICD-10-CM | POA: Diagnosis not present

## 2022-02-05 DIAGNOSIS — R778 Other specified abnormalities of plasma proteins: Secondary | ICD-10-CM | POA: Insufficient documentation

## 2022-02-05 DIAGNOSIS — Z7902 Long term (current) use of antithrombotics/antiplatelets: Secondary | ICD-10-CM | POA: Diagnosis not present

## 2022-02-05 DIAGNOSIS — R600 Localized edema: Secondary | ICD-10-CM | POA: Insufficient documentation

## 2022-02-05 DIAGNOSIS — R7881 Bacteremia: Secondary | ICD-10-CM | POA: Diagnosis not present

## 2022-02-05 DIAGNOSIS — I5022 Chronic systolic (congestive) heart failure: Secondary | ICD-10-CM | POA: Diagnosis not present

## 2022-02-05 DIAGNOSIS — E43 Unspecified severe protein-calorie malnutrition: Secondary | ICD-10-CM | POA: Diagnosis not present

## 2022-02-05 DIAGNOSIS — Z79899 Other long term (current) drug therapy: Secondary | ICD-10-CM | POA: Insufficient documentation

## 2022-02-05 DIAGNOSIS — J90 Pleural effusion, not elsewhere classified: Secondary | ICD-10-CM | POA: Diagnosis not present

## 2022-02-05 DIAGNOSIS — Z9841 Cataract extraction status, right eye: Secondary | ICD-10-CM | POA: Diagnosis not present

## 2022-02-05 DIAGNOSIS — Z7982 Long term (current) use of aspirin: Secondary | ICD-10-CM | POA: Insufficient documentation

## 2022-02-05 DIAGNOSIS — R7309 Other abnormal glucose: Secondary | ICD-10-CM | POA: Insufficient documentation

## 2022-02-05 DIAGNOSIS — R0602 Shortness of breath: Secondary | ICD-10-CM | POA: Diagnosis not present

## 2022-02-05 DIAGNOSIS — B9561 Methicillin susceptible Staphylococcus aureus infection as the cause of diseases classified elsewhere: Secondary | ICD-10-CM | POA: Diagnosis not present

## 2022-02-05 DIAGNOSIS — Z955 Presence of coronary angioplasty implant and graft: Secondary | ICD-10-CM | POA: Diagnosis not present

## 2022-02-05 DIAGNOSIS — I5023 Acute on chronic systolic (congestive) heart failure: Secondary | ICD-10-CM

## 2022-02-05 DIAGNOSIS — K5790 Diverticulosis of intestine, part unspecified, without perforation or abscess without bleeding: Secondary | ICD-10-CM | POA: Diagnosis not present

## 2022-02-05 DIAGNOSIS — R7401 Elevation of levels of liver transaminase levels: Secondary | ICD-10-CM | POA: Diagnosis not present

## 2022-02-05 DIAGNOSIS — Z961 Presence of intraocular lens: Secondary | ICD-10-CM | POA: Diagnosis not present

## 2022-02-05 DIAGNOSIS — I110 Hypertensive heart disease with heart failure: Secondary | ICD-10-CM | POA: Insufficient documentation

## 2022-02-05 DIAGNOSIS — J449 Chronic obstructive pulmonary disease, unspecified: Secondary | ICD-10-CM | POA: Diagnosis not present

## 2022-02-05 DIAGNOSIS — Z9842 Cataract extraction status, left eye: Secondary | ICD-10-CM | POA: Diagnosis not present

## 2022-02-05 DIAGNOSIS — I719 Aortic aneurysm of unspecified site, without rupture: Secondary | ICD-10-CM | POA: Diagnosis not present

## 2022-02-05 DIAGNOSIS — E785 Hyperlipidemia, unspecified: Secondary | ICD-10-CM | POA: Diagnosis not present

## 2022-02-05 DIAGNOSIS — I509 Heart failure, unspecified: Secondary | ICD-10-CM | POA: Diagnosis not present

## 2022-02-05 DIAGNOSIS — K219 Gastro-esophageal reflux disease without esophagitis: Secondary | ICD-10-CM | POA: Diagnosis not present

## 2022-02-05 DIAGNOSIS — M359 Systemic involvement of connective tissue, unspecified: Secondary | ICD-10-CM | POA: Diagnosis not present

## 2022-02-05 DIAGNOSIS — D649 Anemia, unspecified: Secondary | ICD-10-CM | POA: Insufficient documentation

## 2022-02-05 DIAGNOSIS — Z452 Encounter for adjustment and management of vascular access device: Secondary | ICD-10-CM | POA: Diagnosis not present

## 2022-02-05 DIAGNOSIS — R7989 Other specified abnormal findings of blood chemistry: Secondary | ICD-10-CM

## 2022-02-05 LAB — CULTURE, BLOOD (ROUTINE X 2)
Culture: NO GROWTH
Culture: NO GROWTH
Special Requests: ADEQUATE
Special Requests: ADEQUATE

## 2022-02-05 LAB — CBC WITH DIFFERENTIAL/PLATELET
Abs Immature Granulocytes: 0.13 10*3/uL — ABNORMAL HIGH (ref 0.00–0.07)
Basophils Absolute: 0.1 10*3/uL (ref 0.0–0.1)
Basophils Relative: 1 %
Eosinophils Absolute: 0.5 10*3/uL (ref 0.0–0.5)
Eosinophils Relative: 5 %
HCT: 25.1 % — ABNORMAL LOW (ref 39.0–52.0)
Hemoglobin: 8.2 g/dL — ABNORMAL LOW (ref 13.0–17.0)
Immature Granulocytes: 1 %
Lymphocytes Relative: 11 %
Lymphs Abs: 1.2 10*3/uL (ref 0.7–4.0)
MCH: 29.5 pg (ref 26.0–34.0)
MCHC: 32.7 g/dL (ref 30.0–36.0)
MCV: 90.3 fL (ref 80.0–100.0)
Monocytes Absolute: 1.1 10*3/uL — ABNORMAL HIGH (ref 0.1–1.0)
Monocytes Relative: 11 %
Neutro Abs: 7.2 10*3/uL (ref 1.7–7.7)
Neutrophils Relative %: 71 %
Platelets: 136 10*3/uL — ABNORMAL LOW (ref 150–400)
RBC: 2.78 MIL/uL — ABNORMAL LOW (ref 4.22–5.81)
RDW: 14.6 % (ref 11.5–15.5)
WBC: 10.2 10*3/uL (ref 4.0–10.5)
nRBC: 0 % (ref 0.0–0.2)

## 2022-02-05 LAB — COMPREHENSIVE METABOLIC PANEL
ALT: 28 U/L (ref 0–44)
AST: 65 U/L — ABNORMAL HIGH (ref 15–41)
Albumin: 2.2 g/dL — ABNORMAL LOW (ref 3.5–5.0)
Alkaline Phosphatase: 77 U/L (ref 38–126)
Anion gap: 9 (ref 5–15)
BUN: 19 mg/dL (ref 8–23)
CO2: 24 mmol/L (ref 22–32)
Calcium: 7.8 mg/dL — ABNORMAL LOW (ref 8.9–10.3)
Chloride: 105 mmol/L (ref 98–111)
Creatinine, Ser: 1.05 mg/dL (ref 0.61–1.24)
GFR, Estimated: >60 mL/min (ref >60)
Glucose, Bld: 124 mg/dL — ABNORMAL HIGH (ref 70–99)
Potassium: 4 mmol/L (ref 3.5–5.1)
Sodium: 138 mmol/L (ref 135–145)
Total Bilirubin: 0.5 mg/dL (ref 0.3–1.2)
Total Protein: 5.9 g/dL — ABNORMAL LOW (ref 6.5–8.1)

## 2022-02-05 LAB — BRAIN NATRIURETIC PEPTIDE: B Natriuretic Peptide: 2027.8 pg/mL — ABNORMAL HIGH (ref 0.0–100.0)

## 2022-02-05 LAB — TROPONIN I (HIGH SENSITIVITY): Troponin I (High Sensitivity): 128 ng/L (ref <18)

## 2022-02-05 NOTE — ED Provider Notes (Signed)
[facility] EMERGENCY DEPARTMENT Provider Note   CSN: [id number] Arrival date & time: 02/05/22  2057     History  Chief Complaint  Patient presents with   Groin Swelling    Erik [name]. is a 81 y.o. male.  The history is provided by the patient.  He has history of hypertension, hyperlipidemia, coronary artery disease, systolic heart failure and was discharged from the hospital on 02/03/2022 following admission for septic shock and comes in because of swelling in his legs, scrotum, penis.  While he was in the hospital, he was taken off of his furosemide and when discharged, it was not restarted.  He denies chest pain, heaviness, tightness, pressure.  Denies any shortness of breath.    Home Medications Prior to Admission medications   Medication Sig Start Date End Date Taking? Authorizing Provider  acetaminophen (TYLENOL) 500 MG tablet Take 500 mg by mouth every 6 (six) hours as needed for mild pain or moderate pain.    [provider]  aspirin EC 81 MG tablet Take 81 mg by mouth every evening.     [provider]  ceFAZolin (ANCEF) IVPB Inject 2 g into the vein every 8 (eight) hours for 25 days. Indication:  MSSA Bacteremia First Dose: Yes Last Day of Therapy:  02/28/22  Labs - Once weekly:  CBC/D and BMP, Labs - Every other week:  ESR and CRP Method of administration: IV Push Method of administration may be changed at the discretion of home infusion pharmacist based upon assessment of the patient and/or caregiver's ability to self-administer the medication ordered. 02/03/22 02/28/22  Carlyle Basques, MD  clopidogrel (PLAVIX) 75 MG tablet TAKE 1 TABLET BY MOUTH EVERY DAY Patient taking differently: Take 75 mg by mouth daily. 11/30/21   Larey Dresser, MD  cyanocobalamin (VITAMIN B12) 1000 MCG tablet Take 1,000 mcg by mouth daily.    [provider]  dapagliflozin propanediol (FARXIGA) 10 MG TABS tablet Take 1 tablet (10 mg  total) by mouth daily before breakfast. 11/27/21   Larey Dresser, MD  ezetimibe-simvastatin (VYTORIN) 10-40 MG tablet TAKE 1 TABLET BY MOUTH EVERYDAY AT BEDTIME Patient taking differently: Take 1 tablet by mouth at bedtime. 07/17/21   Bedsole, Amy E, MD  famotidine (PEPCID) 20 MG tablet Take 1 tablet (20 mg total) by mouth at bedtime. 01/30/19   Pyrtle, Lajuan Lines, MD  ferrous sulfate 325 (65 FE) MG tablet Take 325 mg by mouth daily with breakfast.    [provider]  methocarbamol (ROBAXIN) 500 MG tablet TAKE 1 TABLET (500 MG TOTAL) BY MOUTH EVERY DAY AT BEDTIME AS NEEDED FOR MUSCLE SPASM Patient taking differently: Take 500 mg by mouth at bedtime as needed for muscle spasms. 12/29/21   Bedsole, Amy E, MD  metoprolol succinate (TOPROL-XL) 50 MG 24 hr tablet TAKE 1 TABLET BY MOUTH EVERY DAY WITH OR IMMEDIATELY FOLLOWING MEAL (NEEDS OFFICE VISIT) Patient taking differently: Take 50 mg by mouth daily. 12/29/21   Larey Dresser, MD  mirtazapine (REMERON) 15 MG tablet Take 15 mg by mouth at bedtime. 11/07/21   [provider]  nitroGLYCERIN (NITROSTAT) 0.4 MG SL tablet Place 1 tablet (0.4 mg total) under the tongue every 5 (five) minutes x 3 doses as needed for chest pain. 02/12/16   Bedsole, Amy E, MD  Omega-3 Fatty Acids (FISH OIL) 1000 MG CAPS Take 1 capsule by mouth daily.    [provider]  rOPINIRole (REQUIP) 3 MG tablet TAKE  1/2 TABLET BY MOUTH IN THE MORNING & TAKE 1 TABLET AT NIGHT Patient taking differently: Take 1.5-3 mg by mouth See admin instructions. Take 1.5m by mouth in the morning and take 35mby mouth at night. 09/23/21   Bedsole, Amy E, MD  spironolactone (ALDACTONE) 25 MG tablet TAKE 1/2 TABLET BY MOUTH EVERY DAY Patient taking differently: Take 12.5 mg by mouth daily. 11/18/21   McLarey DresserMD  tamsulosin (FLOMAX) 0.4 MG CAPS capsule TAKE 1 CAPSULE BY MOUTH EVERY DAY Patient taking differently: Take 0.4 mg by mouth daily. 08/24/21   BeJinny SandersMD       Allergies    Codeine    Review of Systems   Review of Systems  All other systems reviewed and are negative.   Physical Exam Updated Vital Signs BP (!) 142/66   Pulse 81   Temp 98.2 F (36.8 C) (Oral)   Resp 19   SpO2 100%  Physical Exam Vitals and nursing note reviewed.   [age]ear old male, resting comfortably and in no acute distress. Vital signs are significant for borderline elevated blood pressure. Oxygen saturation is 100%, which is normal. Head is normocephalic and atraumatic. PERRLA, EOMI. Oropharynx is clear. Neck is nontender and supple without adenopathy or JVD. Back is nontender and there is no CVA tenderness.  There is 1+ presacral edema. Lungs are clear without rales, wheezes, or rhonchi. Chest is nontender. Heart has regular rate and rhythm without murmur. Abdomen is soft, flat, nontender. Genitalia: Uncircumcised penis with no obvious edema.  Testes descended bilaterally with mild to moderate scrotal edema worse on the left than on the right. Extremities have 2+ pretibial edema, full range of motion is present. Skin is warm and dry without rash. Neurologic: Mental status is normal, cranial nerves are intact, moves all extremities equally.  ED Results / Procedures / Treatments   Labs (all labs ordered are listed, but only abnormal results are displayed) Labs Reviewed  CBC WITH DIFFERENTIAL/PLATELET - Abnormal; Notable for the following components:      Result Value   RBC 2.78 (*)    Hemoglobin 8.2 (*)    HCT 25.1 (*)    Platelets 136 (*)    Monocytes Absolute 1.1 (*)    Abs Immature Granulocytes 0.13 (*)    All other components within normal limits  COMPREHENSIVE METABOLIC PANEL - Abnormal; Notable for the following components:   Glucose, Bld 124 (*)    Calcium 7.8 (*)    Total Protein 5.9 (*)    Albumin 2.2 (*)    AST 65 (*)    All other components within normal limits  BRAIN NATRIURETIC PEPTIDE - Abnormal; Notable for the following components:    B Natriuretic Peptide 2,027.8 (*)    All other components within normal limits  TROPONIN I (HIGH SENSITIVITY) - Abnormal; Notable for the following components:   Troponin I (High Sensitivity) 128 (*)    All other components within normal limits  TROPONIN I (HIGH SENSITIVITY) - Abnormal; Notable for the following components:   Troponin I (High Sensitivity) 108 (*)    All other components within normal limits    EKG None  Radiology DG Chest 2 View  Result Date: 02/05/2022 CLINICAL DATA:  Shortness of breath with testicular and penile swelling. EXAM: CHEST - 2 VIEW COMPARISON:  January 29, 2022 FINDINGS: A right-sided PICC line is noted with its distal tip seen within the distal aspect of the superior vena cava. The heart size  and mediastinal contours are within normal limits. A radiopaque aortic stent is seen along the junction of the descending thoracic aorta and abdominal aorta. Mild diffusely increased interstitial lung markings are seen. Small bilateral pleural effusions are noted, right greater than left. No pneumothorax is identified. The visualized skeletal structures are unremarkable. IMPRESSION: Mild interstitial edema with small bilateral pleural effusions, right greater than left. Electronically Signed   By: Virgina Norfolk M.D.   On: 02/05/2022 22:29    Procedures Procedures    Medications Ordered in ED Medications  furosemide (LASIX) injection 20 mg (has no administration in time range)  ceFAZolin (ANCEF) IVPB 2g/100 mL premix (has no administration in time range)    ED Course/ Medical Decision Making/ A&P                           Medical Decision Making Risk Prescription drug management.   Peripheral edema likely secondary to discontinuation of furosemide.  I have reviewed his old records, and he was admitted to the hospital on 01/29/2022 with hypotension and found to be septic shock from methicillin sensitive Staph aureus.  Furosemide was discontinued because he  was hypotensive.  He is now normotensive to slightly hypertensive.  Chest x-ray shows mild interstitial edema with small bilateral effusions.  I have independently viewed the images, and agree with radiologist's interpretation.  I reviewed and interpreted his laboratory tests, and my interpretation is mild elevation of random glucose, hypoalbuminemia which is likely nutritional, slight elevation of AST with normal ALT (these were both moderately elevated on 11/22, mild anemia and thrombocytopenia which are both slightly improved compared with 11/22, mildly elevated troponin which may be residual from demand ischemia from his episode of sepsi,s -will need repeat troponin to see if it is trending upward, markedly elevated BNP essentially unchanged from 11/22.  I have ordered a dose of furosemide intravenously.  He is getting cefazolin at home via PICC line and has missed his scheduled dose at 10 PM so I have ordered a dose of cefazolin for him.  Repeat troponin has actually declined.  No evidence of non-STEMI.  He has had good urine output following dose of furosemide.  I feel he is safe for discharge at this point, no indication for hospitalization.  I have recommended that he resume taking furosemide once a day and follow-up with his cardiologist.  I have recommended that he start weighing himself daily and to identify what his dry weight is, and use that to direct future diuretic therapy.  Final Clinical Impression(s) / ED Diagnoses Final diagnoses:  Acute on chronic systolic heart failure (HCC)  Elevated troponin  Normochromic normocytic anemia  Elevated AST (SGOT)  Elevated random blood glucose level    Rx / DC Orders ED Discharge Orders          Ordered    furosemide (LASIX) 20 MG tablet  Daily        02/06/22 5885              Delora Fuel, MD [birth year] [phone]

## 2022-02-05 NOTE — ED Notes (Signed)
Trop 128 reported to [place]

## 2022-02-05 NOTE — Telephone Encounter (Signed)
Transition Care Management Follow-up Telephone Call Date of discharge and from where: 02/03/22 Erik [name]  How have you been since you were released from the hospital? Stable  Any questions or concerns? No  Items Reviewed: Did the pt receive and understand the discharge instructions provided? Yes  Medications obtained and verified? Yes  Other?  N/a  Any new allergies since your discharge? No  Dietary orders reviewed? Yes Do you have support at home? Yes   Home Care and Equipment/Supplies: Were home health services ordered? yes If so, what is the name of the agency? [facility]   Has the agency set up a time to come to the patient's home? yes Were any new equipment or medical supplies ordered?  No What is the name of the medical supply agency? N/a  Were you able to get the supplies/equipment? not applicable Do you have any questions related to the use of the equipment or supplies? No  Functional Questionnaire: (I = Independent and D = Dependent) ADLs: I  Bathing/Dressing- I  Meal Prep- I  Eating- I  Maintaining continence- I  Transferring/Ambulation- I  Managing Meds- I  Follow up appointments reviewed:  PCP Hospital f/u appt confirmed? Yes  Scheduled to see Dr. Josefa Half on 11/30 @ 9:00. [facility] f/u appt confirmed? Yes  Scheduled to see Infectious Disease on 03/18/22 @ 2:15. Are transportation arrangements needed? No  If their condition worsens, is the pt aware to call PCP or go to the Emergency Dept.? Yes Was the patient provided with contact information for the PCP's office or ED? Yes Was to pt encouraged to call back with questions or concerns? Yes

## 2022-02-05 NOTE — ED Provider Triage Note (Signed)
Emergency Medicine Provider Triage Evaluation Note  Erik [name]. , a 81 y.o. male  was evaluated in triage.  He has a h/o ischemic cardiomyopathy, CAD status post PCI, heart failure reduced ejection fraction, COPD, and aortic aneurysm.  He had recent admission from 11/17-11/22/23 for MSSA bacteremia and septic shock.  Pt complains of worsening swelling in the groin and scrotum since returning home.  Also some mild swelling in the legs.  He states that he was taken off of Entresto and furosemide during his recent hospitalization.  No fevers.  Review of Systems  Positive: Scrotal swelling, leg swelling Negative: Chest pain  Physical Exam  BP (!) 142/66   Pulse 81   Temp 98.2 F (36.8 C) (Oral)   Resp 19   SpO2 100%  Gen:   Awake, no distress   Resp:  Normal effort  MSK:   Moves extremities without difficulty  Other:  Generalized edema of the scrotum and penile shaft, no cellulitis.  No abdominal wall edema.  Medical Decision Making  Medically screening exam initiated at 9:07 PM.  Appropriate orders placed.  Erik [name]. was informed that the remainder of the evaluation will be completed by another provider, this initial triage assessment does not replace that evaluation, and the importance of remaining in the ED until their evaluation is complete.     Carlisle Cater, PA-C 02/05/22 2118

## 2022-02-05 NOTE — ED Triage Notes (Signed)
Pt with increased swelling to scrotum and penis.  States he was recently admitted for sepsis and has had alterations made to his "fluid pills"  pt states he was told the scrotal swelling was d/t fluid and if it wasn't painful to leave it alone  However today the swelling has increased and spread to his penis as well. No pain.

## 2022-02-06 DIAGNOSIS — B9561 Methicillin susceptible Staphylococcus aureus infection as the cause of diseases classified elsewhere: Secondary | ICD-10-CM | POA: Diagnosis not present

## 2022-02-06 DIAGNOSIS — R7881 Bacteremia: Secondary | ICD-10-CM | POA: Diagnosis not present

## 2022-02-06 DIAGNOSIS — I5022 Chronic systolic (congestive) heart failure: Secondary | ICD-10-CM | POA: Diagnosis not present

## 2022-02-06 DIAGNOSIS — Z452 Encounter for adjustment and management of vascular access device: Secondary | ICD-10-CM | POA: Diagnosis not present

## 2022-02-06 DIAGNOSIS — I110 Hypertensive heart disease with heart failure: Secondary | ICD-10-CM | POA: Diagnosis not present

## 2022-02-06 DIAGNOSIS — E43 Unspecified severe protein-calorie malnutrition: Secondary | ICD-10-CM | POA: Diagnosis not present

## 2022-02-06 LAB — TROPONIN I (HIGH SENSITIVITY): Troponin I (High Sensitivity): 108 ng/L (ref <18)

## 2022-02-06 MED ORDER — CEFAZOLIN SODIUM-DEXTROSE 2-4 GM/100ML-% IV SOLN
2.0000 g | Freq: Once | INTRAVENOUS | Status: AC
  Administered 2022-02-06: 2 g via INTRAVENOUS
  Filled 2022-02-06: qty 100

## 2022-02-06 MED ORDER — FUROSEMIDE 20 MG PO TABS: 20.0000 mg | ORAL_TABLET | Freq: Every day | ORAL | Status: DC

## 2022-02-06 MED ORDER — FUROSEMIDE 10 MG/ML IJ SOLN
20.0000 mg | Freq: Once | INTRAMUSCULAR | Status: AC
  Administered 2022-02-06: 20 mg via INTRAMUSCULAR
  Filled 2022-02-06: qty 2

## 2022-02-06 NOTE — Discharge Instructions (Addendum)
Resume taking furosemide once a day.  Discussed with your cardiologist how to identify your dry weight and use that to adjust your furosemide dose.  Stay on a low-salt diet.  Return if you are having any problems.

## 2022-02-07 ENCOUNTER — Encounter (HOSPITAL_COMMUNITY): Payer: Self-pay | Admitting: Cardiology

## 2022-02-08 ENCOUNTER — Other Ambulatory Visit: Payer: Self-pay | Admitting: Family Medicine

## 2022-02-08 DIAGNOSIS — I110 Hypertensive heart disease with heart failure: Secondary | ICD-10-CM | POA: Diagnosis not present

## 2022-02-08 DIAGNOSIS — B9561 Methicillin susceptible Staphylococcus aureus infection as the cause of diseases classified elsewhere: Secondary | ICD-10-CM | POA: Diagnosis not present

## 2022-02-08 DIAGNOSIS — Z452 Encounter for adjustment and management of vascular access device: Secondary | ICD-10-CM | POA: Diagnosis not present

## 2022-02-08 DIAGNOSIS — Z5181 Encounter for therapeutic drug level monitoring: Secondary | ICD-10-CM | POA: Diagnosis not present

## 2022-02-08 DIAGNOSIS — I5022 Chronic systolic (congestive) heart failure: Secondary | ICD-10-CM | POA: Diagnosis not present

## 2022-02-08 DIAGNOSIS — R7881 Bacteremia: Secondary | ICD-10-CM | POA: Diagnosis not present

## 2022-02-08 DIAGNOSIS — E43 Unspecified severe protein-calorie malnutrition: Secondary | ICD-10-CM | POA: Diagnosis not present

## 2022-02-08 DIAGNOSIS — Z792 Long term (current) use of antibiotics: Secondary | ICD-10-CM | POA: Diagnosis not present

## 2022-02-08 NOTE — Telephone Encounter (Signed)
Last office visit 11/03/21 for weight loss.  Last refilled 12/29/21 for #30 with no refills.  Next Appt: 02/11/2022 for hosptial follow up.

## 2022-02-11 ENCOUNTER — Inpatient Hospital Stay: Payer: Medicare Other | Admitting: Family Medicine

## 2022-02-12 NOTE — Anesthesia Postprocedure Evaluation (Signed)
Anesthesia Post Note  Patient: Erik [name].  Procedure(s) Performed: TRANSESOPHAGEAL ECHOCARDIOGRAM (TEE) BUBBLE STUDY     Patient location during evaluation: PACU Anesthesia Type: MAC Level of consciousness: awake Pain management: pain level controlled Vital Signs Assessment: post-procedure vital signs reviewed and stable Respiratory status: spontaneous breathing Cardiovascular status: stable Postop Assessment: no apparent nausea or vomiting Anesthetic complications: no  No notable events documented.  Last Vitals:  Vitals:   02/03/22 1200 02/03/22 1557  BP: (!) 157/58 (!) 150/52  Pulse: 74 71  Resp: 18 20  Temp:  37.1 C  SpO2: 95% 95%    Last Pain:  Vitals:   02/03/22 1557  TempSrc: Oral  PainSc:                  Huston Foley

## 2022-02-15 ENCOUNTER — Telehealth: Payer: Self-pay | Admitting: Family Medicine

## 2022-02-15 ENCOUNTER — Inpatient Hospital Stay (HOSPITAL_COMMUNITY)
Admission: EM | Admit: 2022-02-15 | Discharge: 2022-02-20 | DRG: 155 | Disposition: A | Discharge status: Home Health | Payer: Medicare Other | Attending: Internal Medicine | Admitting: Internal Medicine

## 2022-02-15 ENCOUNTER — Other Ambulatory Visit: Payer: Self-pay

## 2022-02-15 ENCOUNTER — Telehealth: Payer: Self-pay

## 2022-02-15 ENCOUNTER — Encounter (HOSPITAL_COMMUNITY): Payer: Self-pay

## 2022-02-15 DIAGNOSIS — Z83438 Family history of other disorder of lipoprotein metabolism and other lipidemia: Secondary | ICD-10-CM | POA: Diagnosis not present

## 2022-02-15 DIAGNOSIS — E871 Hypo-osmolality and hyponatremia: Secondary | ICD-10-CM | POA: Diagnosis not present

## 2022-02-15 DIAGNOSIS — D649 Anemia, unspecified: Secondary | ICD-10-CM | POA: Diagnosis present

## 2022-02-15 DIAGNOSIS — Z7984 Long term (current) use of oral hypoglycemic drugs: Secondary | ICD-10-CM

## 2022-02-15 DIAGNOSIS — Z825 Family history of asthma and other chronic lower respiratory diseases: Secondary | ICD-10-CM

## 2022-02-15 DIAGNOSIS — I255 Ischemic cardiomyopathy: Secondary | ICD-10-CM | POA: Diagnosis present

## 2022-02-15 DIAGNOSIS — R040 Epistaxis: Secondary | ICD-10-CM | POA: Diagnosis not present

## 2022-02-15 DIAGNOSIS — D6832 Hemorrhagic disorder due to extrinsic circulating anticoagulants: Secondary | ICD-10-CM | POA: Diagnosis present

## 2022-02-15 DIAGNOSIS — R7881 Bacteremia: Secondary | ICD-10-CM | POA: Diagnosis present

## 2022-02-15 DIAGNOSIS — Z79899 Other long term (current) drug therapy: Secondary | ICD-10-CM

## 2022-02-15 DIAGNOSIS — Z792 Long term (current) use of antibiotics: Secondary | ICD-10-CM | POA: Diagnosis not present

## 2022-02-15 DIAGNOSIS — F17210 Nicotine dependence, cigarettes, uncomplicated: Secondary | ICD-10-CM | POA: Diagnosis present

## 2022-02-15 DIAGNOSIS — E785 Hyperlipidemia, unspecified: Secondary | ICD-10-CM | POA: Diagnosis present

## 2022-02-15 DIAGNOSIS — I2510 Atherosclerotic heart disease of native coronary artery without angina pectoris: Secondary | ICD-10-CM | POA: Diagnosis present

## 2022-02-15 DIAGNOSIS — T45525A Adverse effect of antithrombotic drugs, initial encounter: Secondary | ICD-10-CM | POA: Diagnosis present

## 2022-02-15 DIAGNOSIS — B9561 Methicillin susceptible Staphylococcus aureus infection as the cause of diseases classified elsewhere: Secondary | ICD-10-CM | POA: Diagnosis present

## 2022-02-15 DIAGNOSIS — Z800 Family history of malignant neoplasm of digestive organs: Secondary | ICD-10-CM | POA: Diagnosis not present

## 2022-02-15 DIAGNOSIS — D62 Acute posthemorrhagic anemia: Secondary | ICD-10-CM | POA: Diagnosis present

## 2022-02-15 DIAGNOSIS — Z7901 Long term (current) use of anticoagulants: Secondary | ICD-10-CM | POA: Diagnosis not present

## 2022-02-15 DIAGNOSIS — Z7902 Long term (current) use of antithrombotics/antiplatelets: Secondary | ICD-10-CM

## 2022-02-15 DIAGNOSIS — Z955 Presence of coronary angioplasty implant and graft: Secondary | ICD-10-CM | POA: Diagnosis not present

## 2022-02-15 DIAGNOSIS — Z87892 Personal history of anaphylaxis: Secondary | ICD-10-CM

## 2022-02-15 DIAGNOSIS — Z452 Encounter for adjustment and management of vascular access device: Secondary | ICD-10-CM | POA: Diagnosis not present

## 2022-02-15 DIAGNOSIS — Z5181 Encounter for therapeutic drug level monitoring: Secondary | ICD-10-CM | POA: Diagnosis not present

## 2022-02-15 DIAGNOSIS — N400 Enlarged prostate without lower urinary tract symptoms: Secondary | ICD-10-CM | POA: Diagnosis present

## 2022-02-15 DIAGNOSIS — E43 Unspecified severe protein-calorie malnutrition: Secondary | ICD-10-CM | POA: Diagnosis not present

## 2022-02-15 DIAGNOSIS — J449 Chronic obstructive pulmonary disease, unspecified: Secondary | ICD-10-CM | POA: Diagnosis not present

## 2022-02-15 DIAGNOSIS — I110 Hypertensive heart disease with heart failure: Secondary | ICD-10-CM | POA: Diagnosis present

## 2022-02-15 DIAGNOSIS — I5022 Chronic systolic (congestive) heart failure: Secondary | ICD-10-CM | POA: Diagnosis not present

## 2022-02-15 DIAGNOSIS — E861 Hypovolemia: Secondary | ICD-10-CM | POA: Diagnosis present

## 2022-02-15 DIAGNOSIS — Z8249 Family history of ischemic heart disease and other diseases of the circulatory system: Secondary | ICD-10-CM | POA: Diagnosis not present

## 2022-02-15 DIAGNOSIS — G2581 Restless legs syndrome: Secondary | ICD-10-CM | POA: Diagnosis present

## 2022-02-15 DIAGNOSIS — J3489 Other specified disorders of nose and nasal sinuses: Secondary | ICD-10-CM | POA: Diagnosis not present

## 2022-02-15 DIAGNOSIS — K920 Hematemesis: Secondary | ICD-10-CM | POA: Diagnosis present

## 2022-02-15 DIAGNOSIS — Z86010 Personal history of colonic polyps: Secondary | ICD-10-CM

## 2022-02-15 DIAGNOSIS — Z9861 Coronary angioplasty status: Secondary | ICD-10-CM | POA: Diagnosis not present

## 2022-02-15 DIAGNOSIS — Z885 Allergy status to narcotic agent status: Secondary | ICD-10-CM

## 2022-02-15 DIAGNOSIS — Z7982 Long term (current) use of aspirin: Secondary | ICD-10-CM

## 2022-02-15 MED ORDER — SODIUM CHLORIDE 0.9 % IV BOLUS
1000.0000 mL | Freq: Once | INTRAVENOUS | Status: AC
  Administered 2022-02-15: 1000 mL via INTRAVENOUS

## 2022-02-15 NOTE — ED Provider Triage Note (Incomplete Revision)
Emergency Medicine Provider Triage Evaluation Note  Erik [name]. , a 81 y.o. male  was evaluated in triage.  Pt complains of nosebleed.  Has been ongoing since noon today, refused to come in before now.  Has PICC line right arm due to recent sepsis.  He is on plavix and baby ASA.  Review of Systems  Positive: nosebleed Negative: dizziness  Physical Exam  BP (!) 113/53 (BP Location: Left Arm)   Pulse (!) 106   Temp 98.9 F (37.2 C)   Resp 20   SpO2 96%  Gen:   Awake, no distress   Resp:  Normal effort  MSK:   Moves extremities without difficulty  Other:  Paper towels in both nostrils, once removed there does appear to be clots present in both nostrils without active bleeding.  Medical Decision Making  Medically screening exam initiated at 11:15 PM.  Appropriate orders placed.  Erik [name]. was informed that the remainder of the evaluation will be completed by another provider, this initial triage assessment does not replace that evaluation, and the importance of remaining in the ED until their evaluation is complete.  Nosebleed.  Currently on plavix and ASA.  No active bleeding in triage but does have clots present.  11:29 PM Shortly after initial triage patient began vomiting up blood.  Suspect this is from his nosebleed.  Will check labs, type and screen.  Spoke with charge RN, will expedite room assignment.  11:53 PM Patient attempted to walk to bathroom and had near syncopal episode.  BP dropping into the 16'X systolic.  Placed on stretcher and liter of IVF started.   Larene Pickett, PA-C 02/15/22 [phone]

## 2022-02-15 NOTE — Progress Notes (Addendum)
Chronic Care Management Pharmacy Assistant   Name: Erik [name].  MRN: [id number] DOB: 1940-05-13  Reason for Encounter: CCM [facility] Follow-Up)  Medications: Outpatient Encounter Medications as of 02/15/2022  Medication Sig Note   acetaminophen (TYLENOL) 500 MG tablet Take 500 mg by mouth every 6 (six) hours as needed for mild pain or moderate pain.    aspirin EC 81 MG tablet Take 81 mg by mouth every evening.     ceFAZolin (ANCEF) IVPB Inject 2 g into the vein every 8 (eight) hours for 25 days. Indication:  MSSA Bacteremia First Dose: Yes Last Day of Therapy:  02/28/22  Labs - Once weekly:  CBC/D and BMP, Labs - Every other week:  ESR and CRP Method of administration: IV Push Method of administration may be changed at the discretion of home infusion pharmacist based upon assessment of the patient and/or caregiver's ability to self-administer the medication ordered.    clopidogrel (PLAVIX) 75 MG tablet TAKE 1 TABLET BY MOUTH EVERY DAY (Patient taking differently: Take 75 mg by mouth daily.)    cyanocobalamin (VITAMIN B12) 1000 MCG tablet Take 1,000 mcg by mouth daily.    dapagliflozin propanediol (FARXIGA) 10 MG TABS tablet Take 1 tablet (10 mg total) by mouth daily before breakfast.    ezetimibe-simvastatin (VYTORIN) 10-40 MG tablet TAKE 1 TABLET BY MOUTH EVERYDAY AT BEDTIME (Patient taking differently: Take 1 tablet by mouth at bedtime.)    famotidine (PEPCID) 20 MG tablet Take 1 tablet (20 mg total) by mouth at bedtime.    ferrous sulfate 325 (65 FE) MG tablet Take 325 mg by mouth daily with breakfast.    furosemide (LASIX) 20 MG tablet Take 1 tablet (20 mg total) by mouth daily.    methocarbamol (ROBAXIN) 500 MG tablet Take 1 tablet (500 mg total) by mouth at bedtime as needed for muscle spasms.    metoprolol succinate (TOPROL-XL) 50 MG 24 hr tablet TAKE 1 TABLET BY MOUTH EVERY DAY WITH OR IMMEDIATELY FOLLOWING MEAL (NEEDS OFFICE VISIT) (Patient taking differently: Take 50  mg by mouth daily.)    mirtazapine (REMERON) 15 MG tablet Take 15 mg by mouth at bedtime.    nitroGLYCERIN (NITROSTAT) 0.4 MG SL tablet Place 1 tablet (0.4 mg total) under the tongue every 5 (five) minutes x 3 doses as needed for chest pain. 06/10/2020: .   Omega-3 Fatty Acids (FISH OIL) 1000 MG CAPS Take 1 capsule by mouth daily.    rOPINIRole (REQUIP) 3 MG tablet TAKE 1/2 TABLET BY MOUTH IN THE MORNING & TAKE 1 TABLET AT NIGHT (Patient taking differently: Take 1.5-3 mg by mouth See admin instructions. Take 1.10m by mouth in the morning and take 341mby mouth at night.)    spironolactone (ALDACTONE) 25 MG tablet TAKE 1/2 TABLET BY MOUTH EVERY DAY (Patient taking differently: Take 12.5 mg by mouth daily.)    tamsulosin (FLOMAX) 0.4 MG CAPS capsule TAKE 1 CAPSULE BY MOUTH EVERY DAY (Patient taking differently: Take 0.4 mg by mouth daily.)    No facility-administered encounter medications on file as of 02/15/2022.    Reviewed hospital notes for details of recent visit. Has patient been contacted by Transitions of Care team? No Has patient seen PCP/specialist for hospital follow up (summarize OV if yes): No   Admitted to the hospital on 01/29/2022. Discharge date was 02/03/2022. [facility]  Discharge diagnosis (Principal Problem): Sepsis without acute organ dysfunction  Patient was discharged to Home with Home Health  Brief summary of hospital course: " He was in his usual state of health until 11/17 when he developed chills, cough, and mild abdominal pain.  Denies productive cough, nausea, vomiting, diarrhea.  No other complaints.  Upon arrival to the emergency department he was noted to be hypotensive refractory to IV fluid resuscitation and was started on norepinephrineceFAZolin (ANCEF) IVPB  infusion.  Work-up included chest x-ray which was clear and CT abdomen and pelvis which was concerning for possible early small bowel obstruction.  Laboratory evaluation was  significant for serum creatinine of 1.3, lactic acid 2.5, and hemoglobin 9.  PCCM was requested to admit to ICU for vasopressor management.  Patient presented with septic shock secondary to MSSA bacteremia. Source of infection undetermined. TTE/TEE no endocarditis. Has aortoiliac endograft, no signs of infection there or elswhere on CT. Treated with IV abx, will finish a course of cefazolin to end on 12/17, with ID f/u. PICC placed prior to discharge. Required pressor support initially, now weaned off and BP slightly elevated, will resume some but not all home meds at discharge. Transaminitis likely 2/2 shock liver as improved with time and fluids. PT/OT evaluated, home health ordered. "  New?Medications Started at [facility] Discharge:?? -Started ceFAZolin (ANCEF) IVPB    Medications Discontinued at Hospital Discharge: -Stopped Entresto 24-26 MG (sacubitril-valsartan)  Medications that remain the same after Hospital Discharge:??  -All other medications will remain the same.       2. Admitted to the ED on 02/05/2022. Discharge date was 02/06/2022.  Discharged from [facility].   Discharge diagnosis (Principal Problem): Acute on chronic systolic heart failure  Patient was discharged to Home with Home Health  Brief summary of hospital course: Peripheral edema likely secondary to discontinuation of furosemide.  I have reviewed his old records, and he was admitted to the hospital on 01/29/2022 with hypotension and found to be septic shock from methicillin sensitive Staph aureus.  Furosemide was discontinued because he was hypotensive.  He is now normotensive to slightly hypertensive.  Chest x-ray shows mild interstitial edema with small bilateral effusions.  I have independently viewed the images, and agree with radiologist's interpretation.  I reviewed and interpreted his laboratory tests, and my interpretation is mild elevation of random glucose, hypoalbuminemia which is likely nutritional,  slight elevation of AST with normal ALT (these were both moderately elevated on 11/22, mild anemia and thrombocytopenia which are both slightly improved compared with 11/22, mildly elevated troponin which may be residual from demand ischemia from his episode of sepsi,s -will need repeat troponin to see if it is trending upward, markedly elevated BNP essentially unchanged from 11/22.  I have ordered a dose of furosemide intravenously.  He is getting cefazolin at home via PICC line and has missed his scheduled dose at 10 PM so I have ordered a dose of cefazolin for him.   Repeat troponin has actually declined.  No evidence of non-STEMI.  He has had good urine output following dose of furosemide.  I feel he is safe for discharge at this point, no indication for hospitalization.  I have recommended that he resume taking furosemide once a day and follow-up with his cardiologist.  I have recommended that he start weighing himself daily and to identify what his dry weight is, and use that to direct future diuretic therapy.   New? Medications Started at [facility] Discharge:?? -Started furosemide (LASIX) 20 MG tablet  Medications that remain the same after Hospital Discharge:??  -All other medications will remain the same.  Next CCM appt: 04/09/2022  Other upcoming appts: PCP appointment on 02/16/2022 for [place], PharmD notified and will determine if action is needed.  Marijean Niemann, [place] Clinical Pharmacy Assistant [phone]   Pharmacist addendum: Per review - pt was readmitted 12/4-12/9. Needs close follow up.  Charlene Brooke, PharmD, BCACP 03/05/22 10:31 AM

## 2022-02-15 NOTE — ED Notes (Signed)
Pt had episode of vomiting up dark red emesis

## 2022-02-15 NOTE — ED Provider Triage Note (Cosign Needed)
Emergency Medicine Provider Triage Evaluation Note  Erik [name]. , a 81 y.o. male  was evaluated in triage.  Pt complains of nosebleed.  Has been ongoing since noon today, refused to come in before now.  Has PICC line right arm due to recent sepsis.  He is on plavix and baby ASA.  Review of Systems  Positive: nosebleed Negative: dizziness  Physical Exam  BP (!) 113/53 (BP Location: Left Arm)   Pulse (!) 106   Temp 98.9 F (37.2 C)   Resp 20   SpO2 96%  Gen:   Awake, no distress   Resp:  Normal effort  MSK:   Moves extremities without difficulty  Other:  Paper towels in both nostrils, once removed there does appear to be clots present in both nostrils without active bleeding.  Medical Decision Making  Medically screening exam initiated at 11:15 PM.  Appropriate orders placed.  Erik [name]. was informed that the remainder of the evaluation will be completed by another provider, this initial triage assessment does not replace that evaluation, and the importance of remaining in the ED until their evaluation is complete.  Nosebleed.  Currently on plavix and ASA.  No active bleeding in triage but does have clots present.  11:29 PM Shortly after initial triage patient began vomiting up blood.  Suspect this is from his nosebleed.  Will check labs, type and screen.  Spoke with charge RN, will expedite room assignment.  11:53 PM Patient attempted to walk to bathroom and had near syncopal episode.  BP dropping into the 48'G systolic.  Placed on stretcher and liter of IVF started.  Handoff given to MD assuming care.   Larene Pickett, PA-C 02/15/22 2331    Larene Pickett, PA-C 02/16/22 [phone]

## 2022-02-15 NOTE — ED Triage Notes (Signed)
Pt reports constant nose bleeding from both nares since 1pm today. He takes plavix and is currently on abx via PICC line. Denies any pain. His home health nurse did labs today and was told his hgb was 7.8. Bleeding controlled in triage with gauze packed in each nare.

## 2022-02-15 NOTE — Telephone Encounter (Signed)
Amy from [facility] called in and stated that she is at patient home and drawing labs today. She stated that last week his hemoglobin was 7.8 and those results will be sent over from last week today. She also stated that any labs that are drawn will be faxed over going forward. Thank you!

## 2022-02-15 NOTE — ED Notes (Signed)
Charge RN notified pt to be the next patient to go back.

## 2022-02-16 ENCOUNTER — Telehealth: Payer: Self-pay | Admitting: Family Medicine

## 2022-02-16 ENCOUNTER — Inpatient Hospital Stay: Payer: Medicare Other | Admitting: Family Medicine

## 2022-02-16 ENCOUNTER — Encounter (HOSPITAL_COMMUNITY): Payer: Self-pay | Admitting: Family Medicine

## 2022-02-16 DIAGNOSIS — J449 Chronic obstructive pulmonary disease, unspecified: Secondary | ICD-10-CM | POA: Diagnosis not present

## 2022-02-16 DIAGNOSIS — Z9861 Coronary angioplasty status: Secondary | ICD-10-CM | POA: Diagnosis not present

## 2022-02-16 DIAGNOSIS — D649 Anemia, unspecified: Secondary | ICD-10-CM | POA: Diagnosis not present

## 2022-02-16 DIAGNOSIS — B9561 Methicillin susceptible Staphylococcus aureus infection as the cause of diseases classified elsewhere: Secondary | ICD-10-CM | POA: Diagnosis not present

## 2022-02-16 DIAGNOSIS — R040 Epistaxis: Secondary | ICD-10-CM | POA: Diagnosis present

## 2022-02-16 DIAGNOSIS — I2510 Atherosclerotic heart disease of native coronary artery without angina pectoris: Secondary | ICD-10-CM

## 2022-02-16 DIAGNOSIS — E871 Hypo-osmolality and hyponatremia: Secondary | ICD-10-CM | POA: Diagnosis not present

## 2022-02-16 DIAGNOSIS — R7881 Bacteremia: Secondary | ICD-10-CM

## 2022-02-16 LAB — CBC WITH DIFFERENTIAL/PLATELET
Abs Immature Granulocytes: 0.03 10*3/uL (ref 0.00–0.07)
Basophils Absolute: 0.1 10*3/uL (ref 0.0–0.1)
Basophils Relative: 2 %
Eosinophils Absolute: 0.3 10*3/uL (ref 0.0–0.5)
Eosinophils Relative: 3 %
HCT: 22 % — ABNORMAL LOW (ref 39.0–52.0)
Hemoglobin: 6.8 g/dL — CL (ref 13.0–17.0)
Immature Granulocytes: 0 %
Lymphocytes Relative: 13 %
Lymphs Abs: 1.2 10*3/uL (ref 0.7–4.0)
MCH: 28.2 pg (ref 26.0–34.0)
MCHC: 30.9 g/dL (ref 30.0–36.0)
MCV: 91.3 fL (ref 80.0–100.0)
Monocytes Absolute: 0.9 10*3/uL (ref 0.1–1.0)
Monocytes Relative: 10 %
Neutro Abs: 6.2 10*3/uL (ref 1.7–7.7)
Neutrophils Relative %: 72 %
Platelets: 255 10*3/uL (ref 150–400)
RBC: 2.41 MIL/uL — ABNORMAL LOW (ref 4.22–5.81)
RDW: 14.9 % (ref 11.5–15.5)
WBC: 8.7 10*3/uL (ref 4.0–10.5)
nRBC: 0 % (ref 0.0–0.2)

## 2022-02-16 LAB — PROTIME-INR
INR: 1.3 — ABNORMAL HIGH (ref 0.8–1.2)
Prothrombin Time: 16.1 seconds — ABNORMAL HIGH (ref 11.4–15.2)

## 2022-02-16 LAB — COMPREHENSIVE METABOLIC PANEL
ALT: 6 U/L (ref 0–44)
AST: 23 U/L (ref 15–41)
Albumin: 2.3 g/dL — ABNORMAL LOW (ref 3.5–5.0)
Alkaline Phosphatase: 58 U/L (ref 38–126)
Anion gap: 10 (ref 5–15)
BUN: 31 mg/dL — ABNORMAL HIGH (ref 8–23)
CO2: 23 mmol/L (ref 22–32)
Calcium: 7.6 mg/dL — ABNORMAL LOW (ref 8.9–10.3)
Chloride: 97 mmol/L — ABNORMAL LOW (ref 98–111)
Creatinine, Ser: 1.27 mg/dL — ABNORMAL HIGH (ref 0.61–1.24)
GFR, Estimated: 57 mL/min — ABNORMAL LOW (ref >60)
Glucose, Bld: 135 mg/dL — ABNORMAL HIGH (ref 70–99)
Potassium: 3.8 mmol/L (ref 3.5–5.1)
Sodium: 130 mmol/L — ABNORMAL LOW (ref 135–145)
Total Bilirubin: 0.4 mg/dL (ref 0.3–1.2)
Total Protein: 7 g/dL (ref 6.5–8.1)

## 2022-02-16 LAB — MAGNESIUM: Magnesium: 1.9 mg/dL (ref 1.7–2.4)

## 2022-02-16 LAB — BASIC METABOLIC PANEL
Anion gap: 8 (ref 5–15)
BUN: 28 mg/dL — ABNORMAL HIGH (ref 8–23)
CO2: 24 mmol/L (ref 22–32)
Calcium: 7.5 mg/dL — ABNORMAL LOW (ref 8.9–10.3)
Chloride: 101 mmol/L (ref 98–111)
Creatinine, Ser: 1.1 mg/dL (ref 0.61–1.24)
GFR, Estimated: >60 mL/min (ref >60)
Glucose, Bld: 104 mg/dL — ABNORMAL HIGH (ref 70–99)
Potassium: 4.3 mmol/L (ref 3.5–5.1)
Sodium: 133 mmol/L — ABNORMAL LOW (ref 135–145)

## 2022-02-16 LAB — CBC
HCT: 24.1 % — ABNORMAL LOW (ref 39.0–52.0)
Hemoglobin: 8.1 g/dL — ABNORMAL LOW (ref 13.0–17.0)
MCH: 28.2 pg (ref 26.0–34.0)
MCHC: 33.6 g/dL (ref 30.0–36.0)
MCV: 84 fL (ref 80.0–100.0)
Platelets: 175 10*3/uL (ref 150–400)
RBC: 2.87 MIL/uL — ABNORMAL LOW (ref 4.22–5.81)
RDW: 17.6 % — ABNORMAL HIGH (ref 11.5–15.5)
WBC: 7.7 10*3/uL (ref 4.0–10.5)
nRBC: 0 % (ref 0.0–0.2)

## 2022-02-16 LAB — APTT: aPTT: 33 seconds (ref 24–36)

## 2022-02-16 LAB — PREPARE RBC (CROSSMATCH)

## 2022-02-16 MED ORDER — ACETAMINOPHEN 325 MG PO TABS
650.0000 mg | ORAL_TABLET | Freq: Four times a day (QID) | ORAL | Status: DC | PRN
  Administered 2022-02-16 – 2022-02-18 (×4): 650 mg via ORAL
  Filled 2022-02-16 (×4): qty 2

## 2022-02-16 MED ORDER — MIRTAZAPINE 15 MG PO TABS
15.0000 mg | ORAL_TABLET | Freq: Every day | ORAL | Status: DC
  Administered 2022-02-16 – 2022-02-19 (×4): 15 mg via ORAL
  Filled 2022-02-16 (×4): qty 1

## 2022-02-16 MED ORDER — FAMOTIDINE 20 MG PO TABS
20.0000 mg | ORAL_TABLET | Freq: Two times a day (BID) | ORAL | Status: DC
  Administered 2022-02-16 – 2022-02-20 (×9): 20 mg via ORAL
  Filled 2022-02-16 (×9): qty 1

## 2022-02-16 MED ORDER — ONDANSETRON HCL 4 MG PO TABS: 4.0000 mg | ORAL_TABLET | Freq: Four times a day (QID) | ORAL | Status: DC | PRN

## 2022-02-16 MED ORDER — METHOCARBAMOL 500 MG PO TABS
500.0000 mg | ORAL_TABLET | Freq: Every day | ORAL | Status: DC
  Administered 2022-02-16 – 2022-02-19 (×4): 500 mg via ORAL
  Filled 2022-02-16 (×4): qty 1

## 2022-02-16 MED ORDER — ACETAMINOPHEN 650 MG RE SUPP: 650.0000 mg | Freq: Four times a day (QID) | RECTAL | Status: DC | PRN

## 2022-02-16 MED ORDER — SODIUM CHLORIDE 0.9% FLUSH
3.0000 mL | Freq: Two times a day (BID) | INTRAVENOUS | Status: DC
  Administered 2022-02-16 – 2022-02-19 (×6): 3 mL via INTRAVENOUS

## 2022-02-16 MED ORDER — SPIRONOLACTONE 12.5 MG HALF TABLET
12.5000 mg | ORAL_TABLET | Freq: Every day | ORAL | Status: DC
  Administered 2022-02-17 – 2022-02-20 (×4): 12.5 mg via ORAL
  Filled 2022-02-16 (×5): qty 1

## 2022-02-16 MED ORDER — SENNOSIDES-DOCUSATE SODIUM 8.6-50 MG PO TABS: 1.0000 | ORAL_TABLET | Freq: Every evening | ORAL | Status: DC | PRN

## 2022-02-16 MED ORDER — METOPROLOL SUCCINATE ER 50 MG PO TB24
50.0000 mg | ORAL_TABLET | Freq: Every evening | ORAL | Status: DC
  Administered 2022-02-16 – 2022-02-19 (×4): 50 mg via ORAL
  Filled 2022-02-16 (×2): qty 1
  Filled 2022-02-16: qty 2
  Filled 2022-02-16: qty 1

## 2022-02-16 MED ORDER — TAMSULOSIN HCL 0.4 MG PO CAPS
0.4000 mg | ORAL_CAPSULE | Freq: Every day | ORAL | Status: DC
  Administered 2022-02-16 – 2022-02-20 (×5): 0.4 mg via ORAL
  Filled 2022-02-16 (×5): qty 1

## 2022-02-16 MED ORDER — ROPINIROLE HCL 1 MG PO TABS: 1.5000 mg | ORAL_TABLET | ORAL | Status: DC

## 2022-02-16 MED ORDER — ONDANSETRON HCL 4 MG/2ML IJ SOLN
4.0000 mg | Freq: Four times a day (QID) | INTRAMUSCULAR | Status: DC | PRN
  Administered 2022-02-17: 4 mg via INTRAVENOUS
  Filled 2022-02-16: qty 2

## 2022-02-16 MED ORDER — SODIUM CHLORIDE 0.9 % IV SOLN: 10.0000 mL/h | Freq: Once | INTRAVENOUS | Status: DC

## 2022-02-16 MED ORDER — ROPINIROLE HCL 1 MG PO TABS
1.5000 mg | ORAL_TABLET | Freq: Every morning | ORAL | Status: DC
  Administered 2022-02-16 – 2022-02-20 (×5): 1.5 mg via ORAL
  Filled 2022-02-16 (×5): qty 2

## 2022-02-16 MED ORDER — CEFAZOLIN SODIUM-DEXTROSE 2-4 GM/100ML-% IV SOLN
2.0000 g | Freq: Three times a day (TID) | INTRAVENOUS | Status: DC
  Administered 2022-02-16 – 2022-02-20 (×14): 2 g via INTRAVENOUS
  Filled 2022-02-16 (×14): qty 100

## 2022-02-16 MED ORDER — ALBUTEROL SULFATE (2.5 MG/3ML) 0.083% IN NEBU: 2.5000 mg | INHALATION_SOLUTION | RESPIRATORY_TRACT | Status: DC | PRN

## 2022-02-16 MED ORDER — FUROSEMIDE 20 MG PO TABS
20.0000 mg | ORAL_TABLET | Freq: Every day | ORAL | Status: DC
  Administered 2022-02-16 – 2022-02-20 (×5): 20 mg via ORAL
  Filled 2022-02-16 (×5): qty 1

## 2022-02-16 MED ORDER — HYDROMORPHONE HCL 1 MG/ML IJ SOLN
0.5000 mg | Freq: Once | INTRAMUSCULAR | Status: AC
  Administered 2022-02-16: 0.5 mg via INTRAVENOUS
  Filled 2022-02-16: qty 1

## 2022-02-16 MED ORDER — ROPINIROLE HCL 1 MG PO TABS
3.0000 mg | ORAL_TABLET | Freq: Every day | ORAL | Status: DC
  Administered 2022-02-16 – 2022-02-19 (×4): 3 mg via ORAL
  Filled 2022-02-16 (×4): qty 3

## 2022-02-16 MED ORDER — PHENOL 1.4 % MT LIQD: 1.0000 | OROMUCOSAL | Status: DC | PRN

## 2022-02-16 MED ORDER — HYDRALAZINE HCL 20 MG/ML IJ SOLN
5.0000 mg | Freq: Four times a day (QID) | INTRAMUSCULAR | Status: DC | PRN
  Administered 2022-02-17: 5 mg via INTRAVENOUS
  Filled 2022-02-16: qty 1

## 2022-02-16 MED ORDER — CHLORHEXIDINE GLUCONATE CLOTH 2 % EX PADS
6.0000 | MEDICATED_PAD | Freq: Every day | CUTANEOUS | Status: DC
  Administered 2022-02-17 – 2022-02-20 (×5): 6 via TOPICAL

## 2022-02-16 NOTE — Progress Notes (Signed)
Pharmacy Antibiotic Note  Danford Tat. is a 81 y.o. male admitted on 02/15/2022 with epistaxis.  Pharmacy has been consulted to continue Ancef dosing for MSSA bacteremia.  Plan: Ancef 2 g IV q8h  Height: '5\' 6"'$  (167.6 cm) Weight: 49.9 kg (110 lb) IBW/kg (Calculated) : 63.8  Temp (24hrs), Avg:98.6 F (37 C), Min:98.1 F (36.7 C), Max:98.9 F (37.2 C)  Recent Labs  Lab 02/15/22 2340  WBC 8.7  CREATININE 1.27*    Estimated Creatinine Clearance: 32.7 mL/min (A) (by C-G formula based on SCr of 1.27 mg/dL (H)).    Allergies  Allergen Reactions   Codeine Swelling    throat swells    Caryl Pina 02/16/2022 2:00 AM

## 2022-02-16 NOTE — ED Provider Notes (Signed)
[facility] Emergency Department Provider Note MRN:  [id number]  Arrival date & time: 02/16/22     Chief Complaint   Epistaxis   History of Present Illness   Erik [name]. is a 81 y.o. year-old male with a history of CAD, CHF presenting to the ED with chief complaint of epistaxis.  Persistent nosebleed since noon, came to the emergency department for evaluation.  Had an episode of hematemesis in the waiting room followed by near syncopal episode.  Found to have low blood pressure, rushed to the emergency room bed.  Patient endorsing continued trickling of blood, no pain, no trauma to the nose, no recent cough or cold-like symptoms.  Was recently in the hospital for sepsis.  Review of Systems  A thorough review of systems was obtained and all systems are negative except as noted in the HPI and PMH.   Patient's Health History    Past Medical History:  Diagnosis Date   Adenomatous colon polyp    Aortic aneurysm (HCC)    BPH (benign prostatic hypertrophy)    CAD (coronary artery disease)    Carotid artery stenosis    a. Bilateral CEA   Cataract    CHF (congestive heart failure) (HCC)    Chronic systolic heart failure (HCC)    a. EF 20-25%, mild LVH, mod HK, mid apicalanteroseptal myocardium, mild MR, LA mod dilated   Collagen vascular disease (HCC)    COPD (chronic obstructive pulmonary disease) ([place])    Coronary artery disease    a. LHC (08/2013): Lmain: short 30% distal, LAD: sml D1 & D2, 70% ostial D1, 95-99% LAD stenosis prox D2 LCx: sml/mod ramus subtot. occluded, 40% ostial set off lg OM1, 40% AV LCx after OM1, RCA: 90% prox (DES to RCA and prox LAD)   Diverticulosis    GERD (gastroesophageal reflux disease)    Heart murmur    History of colonic polyps    Hyperlipidemia    Hyperplastic colon polyp    Hypertension    Ischemic cardiomyopathy    RLS (restless legs syndrome)     Past Surgical History:  Procedure Laterality Date   BUBBLE  STUDY  02/03/2022   Procedure: BUBBLE STUDY;  Surgeon: Freada Bergeron, MD;  Location: [facility];  Service: Cardiovascular;;   cardiac stents  09-2013   CAROTID ENDARTERECTOMY  04/17/2008   right   CAROTID ENDARTERECTOMY  05/30/08   Left   CATARACT EXTRACTION W/PHACO Right 10/05/2018   Procedure: CATARACT EXTRACTION PHACO AND INTRAOCULAR LENS PLACEMENT ([place]), RIGHT;  Surgeon: Birder Robson, MD;  Location: [facility] ORS;  Service: Ophthalmology;  Laterality: Right;  [place] 01:06.4 cde 12.79 Fluid Pack Lot # [id number]   CATARACT EXTRACTION W/PHACO Left 11/02/2018   Procedure: CATARACT EXTRACTION PHACO AND INTRAOCULAR LENS PLACEMENT ([place]), LEFT;  Surgeon: Birder Robson, MD;  Location: [facility] ORS;  Service: Ophthalmology;  Laterality: Left;  [place]  01:10 CDE 11.52 Fluid pack lot # [id number]   CHOLECYSTECTOMY     Gall Bladder   CORONARY ANGIOGRAM  09/11/2013   Procedure: CORONARY ANGIOGRAM;  Surgeon: Troy Sine, MD;  Location: [facility] CATH LAB;  Service: Cardiovascular;;   CORONARY ANGIOPLASTY     STENTS   HERNIA REPAIR     lower aorta aneurysm  05/26/2015   [facility]   PERCUTANEOUS STENT INTERVENTION  09/11/2013   Procedure: PERCUTANEOUS STENT INTERVENTION;  Surgeon: Troy Sine, MD;  Location: [facility] CATH LAB;  Service: Cardiovascular;;  DES Prox RCA 3.5x15 xience  TEE WITHOUT CARDIOVERSION N/A 02/03/2022   Procedure: TRANSESOPHAGEAL ECHOCARDIOGRAM (TEE);  Surgeon: Freada Bergeron, MD;  Location: [facility] ENDOSCOPY;  Service: Cardiovascular;  Laterality: N/A;    Family History  Problem Relation Age of Onset   Heart disease Brother    Hyperlipidemia Brother    Pancreatic cancer Brother    Hypertension Mother    Cancer Father        unknown type; sounds GI   Emphysema Father    Hyperlipidemia Brother    Hyperlipidemia Brother    Heart attack Brother    Hyperlipidemia Brother    Multiple sclerosis Brother    Kidney disease Neg Hx    Prostate cancer Neg Hx    Colon cancer Neg Hx     Esophageal cancer Neg Hx    Rectal cancer Neg Hx    Stomach cancer Neg Hx     Social History   Socioeconomic History   Marital status: Married    Spouse name: Not on file   Number of children: Not on file   Years of education: Not on file   Highest education level: Not on file  Occupational History   Occupation: Retired Development worker, community  Tobacco Use   Smoking status: Every Day    Packs/day: 1.00    Years: 50.00    Total pack years: 50.00    Types: Cigarettes   Smokeless tobacco: Current   Tobacco comments:    off and on since age 2, currently smokes a pack a day [facility] 12/23/20  Vaping Use   Vaping Use: Never used  Substance and Sexual Activity   Alcohol use: No   Drug use: No   Sexual activity: Yes  Other Topics Concern   Not on file  Social History Narrative   Lives with wife in [place]. Retired from the post office   Social Determinants of Radio broadcast assistant Strain: Low Risk  (04/29/2021)   Overall Financial Resource Strain (CARDIA)    Difficulty of Paying Living Expenses: Not very hard  Food Insecurity: No Food Insecurity (04/29/2021)   Hunger Vital Sign    Worried About Running Out of Food in the Last Year: Never true    [place] in the Last Year: Never true  Transportation Needs: No Transportation Needs (05/31/2019)   PRAPARE - Hydrologist (Medical): No    Lack of Transportation (Non-Medical): No  Physical Activity: Inactive (05/31/2019)   Exercise Vital Sign    Days of Exercise per Week: 0 days    Minutes of Exercise per Session: 0 min  Stress: No Stress Concern Present (05/31/2019)   [place]    Feeling of Stress : Not at all  Social Connections: Not on file  Intimate Partner Violence: Not At Risk (05/31/2019)   Humiliation, Afraid, Rape, and Kick questionnaire    Fear of Current or Ex-Partner: No    Emotionally Abused: No    Physically Abused: No     Sexually Abused: No     Physical Exam   Vitals:   02/16/22 0116 02/16/22 0131  BP: (!) 122/47 (!) 122/50  Pulse: 84 80  Resp: 19 15  Temp: 98.8 F (37.1 C) 98.1 F (36.7 C)  SpO2:  95%    CONSTITUTIONAL: Ill-appearing, NAD, appears tired and pale NEURO/PSYCH:  Alert and oriented x 3, no focal deficits EYES:  eyes equal and reactive ENT/NECK:  no LAD, no JVD CARDIO:  Regular rate, well-perfused, normal S1 and S2 PULM:  CTAB no wheezing or rhonchi GI/GU:  non-distended, non-tender MSK/SPINE:  No gross deformities, no edema SKIN:  no rash, atraumatic   *Additional and/or pertinent findings included in MDM below  Diagnostic and Interventional Summary    EKG Interpretation  Date/Time:    Ventricular Rate:    PR Interval:    QRS Duration:   QT Interval:    QTC Calculation:   R Axis:     Text Interpretation:         Labs Reviewed  CBC WITH DIFFERENTIAL/PLATELET - Abnormal; Notable for the following components:      Result Value   RBC 2.41 (*)    Hemoglobin 6.8 (*)    HCT 22.0 (*)    All other components within normal limits  COMPREHENSIVE METABOLIC PANEL - Abnormal; Notable for the following components:   Sodium 130 (*)    Chloride 97 (*)    Glucose, Bld 135 (*)    BUN 31 (*)    Creatinine, Ser 1.27 (*)    Calcium 7.6 (*)    Albumin 2.3 (*)    GFR, Estimated 57 (*)    All other components within normal limits  PROTIME-INR - Abnormal; Notable for the following components:   Prothrombin Time 16.1 (*)    INR 1.3 (*)    All other components within normal limits  APTT  BASIC METABOLIC PANEL  MAGNESIUM  CBC  TYPE AND SCREEN  PREPARE RBC (CROSSMATCH)    No orders to display    Medications  0.9 %  sodium chloride infusion (0 mL/hr Intravenous Hold 02/16/22 0043)  sodium chloride flush (NS) 0.9 % injection 3 mL (3 mLs Intravenous Not Given 02/16/22 0134)  acetaminophen (TYLENOL) tablet 650 mg (has no administration in time range)    Or  acetaminophen  (TYLENOL) suppository 650 mg (has no administration in time range)  senna-docusate (Senokot-S) tablet 1 tablet (has no administration in time range)  ondansetron (ZOFRAN) tablet 4 mg (has no administration in time range)    Or  ondansetron (ZOFRAN) injection 4 mg (has no administration in time range)  albuterol (PROVENTIL) (2.5 MG/3ML) 0.083% nebulizer solution 2.5 mg (has no administration in time range)  sodium chloride 0.9 % bolus 1,000 mL (0 mLs Intravenous Stopped 02/16/22 0110)  HYDROmorphone (DILAUDID) injection 0.5 mg (0.5 mg Intravenous Given 02/16/22 0046)     Procedures  /  Critical Care .Critical Care  Performed by: Maudie Flakes, MD Authorized by: Maudie Flakes, MD   Critical care provider statement:    Critical care time (minutes):  35   Critical care was necessary to treat or prevent imminent or life-threatening deterioration of the following conditions: Symptomatic anemia requiring blood transfusion.   Critical care was time spent personally by me on the following activities:  Development of treatment plan with patient or surrogate, discussions with consultants, evaluation of patient's response to treatment, examination of patient, ordering and review of laboratory studies, ordering and review of radiographic studies, ordering and performing treatments and interventions, pulse oximetry, re-evaluation of patient's condition and review of old charts .Epistaxis Management  Date/Time: 02/16/2022 1:41 AM  Performed by: Maudie Flakes, MD Authorized by: Maudie Flakes, MD   Consent:    Consent obtained:  Verbal   Consent given by:  Patient   Risks, benefits, and alternatives were discussed: yes     Risks discussed:  Bleeding, infection, nasal injury and pain Universal protocol:  Procedure explained and questions answered to patient or proxy's satisfaction: yes     Immediately prior to procedure, a time out was called: yes     Patient identity confirmed:  Verbally with  patient Anesthesia:    Anesthesia method:  Topical application   Topical anesthesia: Lidocaine. Procedure details:    Treatment site:  R anterior, L anterior and R posterior   Treatment method:  Nasal balloon   Treatment complexity:  Extensive   Treatment episode: initial   Post-procedure details:    Assessment:  Bleeding stopped   Procedure completion:  Tolerated well, no immediate complications Comments:     Patient not really sure which nostril is bleeding more, blood is trickling down the back of the throat.  His right nare was packed in triage and so this nostril was treated first with a Rhino Rocket 900.  6 cc of air infused into both balloons to treat anteriorly and posteriorly.  He still felt some trickling of blood after this and so the left nare was treated as well.  The left nare would not take another [place], would not fit.  And so a 7.5 anterior Rhino Rocket was placed in the left nare, again with 6 cc of air.  On reassessment patient says that the bleeding has stopped.   ED Course and Medical Decision Making  Initial Impression and Ddx There is minimal blood visualized in the nares anteriorly, raising concern for possible posterior bleed.  Given the hypotension and hematemesis, decision was made to be aggressive with epistaxis management.  2 Rhino Rocket's placed, see procedural details above.  Patient's blood pressure has normalized quickly, suspect it was a vagal episode.  H&H does show hemoglobin of 6.8 down from a baseline of around 8 or 9 and so we will transfuse blood and admit to hospital service.  Past medical/surgical history that increases complexity of ED encounter: CAD on Plavix  Interpretation of Diagnostics I personally reviewed the laboratory assessment and my interpretation is as follows: Downtrending H&H, otherwise no significant blood count or electrolyte disturbance    Patient Reassessment and Ultimate Disposition/Management     Admission,  accepted by Dr. Octavia Bruckner Opyd.  Patient management required discussion with the following services or consulting groups:  Hospitalist Service  Complexity of Problems Addressed Acute illness or injury that poses threat of life of bodily function  Additional Data Reviewed and Analyzed Further history obtained from: Prior labs/imaging results  Additional Factors Impacting ED Encounter Risk Consideration of hospitalization  Barth Kirks. Sedonia Small, MD [place] [email or web address]  Final Clinical Impressions(s) / ED Diagnoses     ICD-10-CM   1. Epistaxis  R04.0     2. Symptomatic anemia  D64.9       ED Discharge Orders     None        Discharge Instructions Discussed with and Provided to Patient:   Discharge Instructions   None      Maudie Flakes, MD 02/16/22 [phone]

## 2022-02-16 NOTE — Telephone Encounter (Signed)
Patient wife called in and wanted to let Dr. [name] know that Erik [name] is in the hospital. His blood count is lows and they had to give him blood. Thank you!

## 2022-02-16 NOTE — ED Notes (Signed)
;  pt receiving blood'@336'$  lab

## 2022-02-16 NOTE — Progress Notes (Addendum)
Patient placed in observation after midnight, please see H&P.  Here with a nose bleed, ASA and plavix held.  Rhinorockets x 2 placed in ER.  Getting 2 units of blood.  Plan to monitor h/h after transfusion.  Called [phone] to make appointment for follow up but no answer-- will need to call again prior to d/c. Discussed briefly with on-call ENT and agree with current management-- if any changes can call back.  Per chart review of note from Dr. Aundra Dubin:  CAD: [place] 09/11/13 with 2 vessel disease that required PCI of proximal LAD and proximal RCA.  Cardiolite in 6/20 with no ischemia but fixed apical septal defect.  No recent chest pain.  - Continue Plavix + ASA. Will be on Plavix indefinitely.  Erik Bear DO

## 2022-02-16 NOTE — Telephone Encounter (Signed)
Agree with ER visit ASAP.  Not sure why notification was not sen for low Hg last week.

## 2022-02-16 NOTE — H&P (Signed)
History and Physical    Erik [name]. OEU:[id number] DOB: 1940/07/13 [name]: 02/15/2022  PCP: [name], MD   Patient coming from: home   Chief Complaint: Nose bleed  HPI: Erik [name]. is a 81 y.o. male with medical history significant for CAD, ischemic cardiomyopathy, mild COPD, and recent admission for MSSA bacteremia who now presents to the emergency department with epistaxis.  Patient reports he had been in his usual state of health until approximately 1 PM when he developed bleeding from bilateral nares.  His wife notes that he had been picking his nose prior to this.  He reports history of epistaxis that typically stops on its own, but the current episode persisted throughout the day and into the night, eventually prompting his presentation to the ED where he vomited dark red blood.  He has had lightheadedness associated with this.  Denies chest pain, abdominal pain, or shortness of breath.    ED Course: Upon arrival to the ED, patient is found to be afebrile and saturating well on room air with systolic blood pressure of 95 and greater.  Blood work notable for hemoglobin 6.8, sodium 130, potassium 1.27, and albumin 2.3.  Rhino Rocket was placed bilaterally, 1 L normal saline and 0.5 mg IV Dilaudid were administered, and transfusion of 2 units packed RBCs was initiated.  Review of Systems:  All other systems reviewed and apart from HPI, are negative.  Past Medical History:  Diagnosis Date   Adenomatous colon polyp    Aortic aneurysm (HCC)    BPH (benign prostatic hypertrophy)    CAD (coronary artery disease)    Carotid artery stenosis    a. Bilateral CEA   Cataract    CHF (congestive heart failure) (HCC)    Chronic systolic heart failure (HCC)    a. EF 20-25%, mild LVH, mod HK, mid apicalanteroseptal myocardium, mild MR, LA mod dilated   Collagen vascular disease (HCC)    COPD (chronic obstructive pulmonary disease) ([place])    Coronary artery disease    a.  LHC (08/2013): Lmain: short 30% distal, LAD: sml D1 & D2, 70% ostial D1, 95-99% LAD stenosis prox D2 LCx: sml/mod ramus subtot. occluded, 40% ostial set off lg OM1, 40% AV LCx after OM1, RCA: 90% prox (DES to RCA and prox LAD)   Diverticulosis    GERD (gastroesophageal reflux disease)    Heart murmur    History of colonic polyps    Hyperlipidemia    Hyperplastic colon polyp    Hypertension    Ischemic cardiomyopathy    RLS (restless legs syndrome)     Past Surgical History:  Procedure Laterality Date   BUBBLE STUDY  02/03/2022   Procedure: BUBBLE STUDY;  Surgeon: Freada Bergeron, MD;  Location: [place];  Service: Cardiovascular;;   cardiac stents  09-2013   CAROTID ENDARTERECTOMY  04/17/2008   right   CAROTID ENDARTERECTOMY  05/30/08   Left   CATARACT EXTRACTION W/PHACO Right 10/05/2018   Procedure: CATARACT EXTRACTION PHACO AND INTRAOCULAR LENS PLACEMENT ([place]), RIGHT;  Surgeon: Birder Robson, MD;  Location: [facility];  Service: Ophthalmology;  Laterality: Right;  [place] 01:06.4 cde 12.79 Fluid Pack Lot # [id number]   CATARACT EXTRACTION W/PHACO Left 11/02/2018   Procedure: CATARACT EXTRACTION PHACO AND INTRAOCULAR LENS PLACEMENT ([place]), LEFT;  Surgeon: Birder Robson, MD;  Location: [facility];  Service: Ophthalmology;  Laterality: Left;  [place]  01:10 CDE 11.52 Fluid pack lot # [id number]   CHOLECYSTECTOMY  Gall Bladder   CORONARY ANGIOGRAM  09/11/2013   Procedure: CORONARY ANGIOGRAM;  Surgeon: Troy Sine, MD;  Location: [facility] CATH LAB;  Service: Cardiovascular;;   CORONARY ANGIOPLASTY     STENTS   HERNIA REPAIR     lower aorta aneurysm  05/26/2015   [facility]   PERCUTANEOUS STENT INTERVENTION  09/11/2013   Procedure: PERCUTANEOUS STENT INTERVENTION;  Surgeon: Troy Sine, MD;  Location: [place] CATH LAB;  Service: Cardiovascular;;  DES Prox RCA 3.5x15 xience    TEE WITHOUT CARDIOVERSION N/A 02/03/2022   Procedure: TRANSESOPHAGEAL ECHOCARDIOGRAM (TEE);  Surgeon: Freada Bergeron, MD;  Location: [facility] ENDOSCOPY;  Service: Cardiovascular;  Laterality: N/A;    Social History:   reports that he has been smoking cigarettes. He has a 50.00 pack-year smoking history. He uses smokeless tobacco. He reports that he does not drink alcohol and does not use drugs.  Allergies  Allergen Reactions   Codeine Swelling    throat swells    Family History  Problem Relation Age of Onset   Heart disease Brother    Hyperlipidemia Brother    Pancreatic cancer Brother    Hypertension Mother    Cancer Father        unknown type; sounds GI   Emphysema Father    Hyperlipidemia Brother    Hyperlipidemia Brother    Heart attack Brother    Hyperlipidemia Brother    Multiple sclerosis Brother    Kidney disease Neg Hx    Prostate cancer Neg Hx    Colon cancer Neg Hx    Esophageal cancer Neg Hx    Rectal cancer Neg Hx    Stomach cancer Neg Hx      Prior to Admission medications   Medication Sig Start Date End Date Taking? Authorizing Provider  acetaminophen (TYLENOL) 500 MG tablet Take 500 mg by mouth every 6 (six) hours as needed for mild pain or moderate pain.    [provider]  aspirin EC 81 MG tablet Take 81 mg by mouth every evening.     [provider]  ceFAZolin (ANCEF) IVPB Inject 2 g into the vein every 8 (eight) hours for 25 days. Indication:  MSSA Bacteremia First Dose: Yes Last Day of Therapy:  02/28/22  Labs - Once weekly:  CBC/D and BMP, Labs - Every other week:  ESR and CRP Method of administration: IV Push Method of administration may be changed at the discretion of home infusion pharmacist based upon assessment of the patient and/or caregiver's ability to self-administer the medication ordered. 02/03/22 02/28/22  Carlyle Basques, MD  clopidogrel (PLAVIX) 75 MG tablet TAKE 1 TABLET BY MOUTH EVERY DAY Patient taking differently: Take 75 mg by mouth daily. 11/30/21   Larey Dresser, MD  cyanocobalamin (VITAMIN B12) 1000 MCG tablet Take 1,000 mcg  by mouth daily.    [provider]  dapagliflozin propanediol (FARXIGA) 10 MG TABS tablet Take 1 tablet (10 mg total) by mouth daily before breakfast. 11/27/21   Larey Dresser, MD  ezetimibe-simvastatin (VYTORIN) 10-40 MG tablet TAKE 1 TABLET BY MOUTH EVERYDAY AT BEDTIME Patient taking differently: Take 1 tablet by mouth at bedtime. 07/17/21   Bedsole, Amy E, MD  famotidine (PEPCID) 20 MG tablet Take 1 tablet (20 mg total) by mouth at bedtime. 01/30/19   Pyrtle, Lajuan Lines, MD  ferrous sulfate 325 (65 FE) MG tablet Take 325 mg by mouth daily with breakfast.    [provider]  furosemide (LASIX) 20 MG  tablet Take 1 tablet (20 mg total) by mouth daily. 51/88/41   Delora Fuel, MD  methocarbamol (ROBAXIN) 500 MG tablet Take 1 tablet (500 mg total) by mouth at bedtime as needed for muscle spasms. 02/09/22   Bedsole, Amy E, MD  metoprolol succinate (TOPROL-XL) 50 MG 24 hr tablet TAKE 1 TABLET BY MOUTH EVERY DAY WITH OR IMMEDIATELY FOLLOWING MEAL (NEEDS OFFICE VISIT) Patient taking differently: Take 50 mg by mouth daily. 12/29/21   Larey Dresser, MD  mirtazapine (REMERON) 15 MG tablet Take 15 mg by mouth at bedtime. 11/07/21   [provider]  nitroGLYCERIN (NITROSTAT) 0.4 MG SL tablet Place 1 tablet (0.4 mg total) under the tongue every 5 (five) minutes x 3 doses as needed for chest pain. 02/12/16   Bedsole, Amy E, MD  Omega-3 Fatty Acids (FISH OIL) 1000 MG CAPS Take 1 capsule by mouth daily.    [provider]  rOPINIRole (REQUIP) 3 MG tablet TAKE 1/2 TABLET BY MOUTH IN THE MORNING & TAKE 1 TABLET AT NIGHT Patient taking differently: Take 1.5-3 mg by mouth See admin instructions. Take 1.48m by mouth in the morning and take 324mby mouth at night. 09/23/21   Bedsole, Amy E, MD  spironolactone (ALDACTONE) 25 MG tablet TAKE 1/2 TABLET BY MOUTH EVERY DAY Patient taking differently: Take 12.5 mg by mouth daily. 11/18/21   McLarey DresserMD  tamsulosin (FLOMAX) 0.4 MG CAPS  capsule TAKE 1 CAPSULE BY MOUTH EVERY DAY Patient taking differently: Take 0.4 mg by mouth daily. 08/24/21   BeJinny SandersMD    Physical Exam: Vitals:   02/16/22 0045 02/16/22 0100 02/16/22 0115 02/16/22 0116  BP: (!) 127/48 (!) 118/45 (!) 123/46 (!) 122/47  Pulse: 84 83 86 84  Resp: _0 Temp:    98.8 F (37.1 C)  TempSrc:    Oral  SpO2: 96% 95% 97%   Weight:      Height:        Constitutional: NAD, no pallor or diaphoresis   Eyes: PERTLA, lids and conjunctivae normal ENMT: Mucous membranes are moist. Nasal packing b/l, crusted blood around nares.    Neck: supple, no masses  Respiratory: no wheezing, no crackles. No accessory muscle use.  Cardiovascular: S1 & S2 heard, regular rate and rhythm. No extremity edema.   Abdomen: No distension, no tenderness, soft. Bowel sounds active.  Musculoskeletal: no clubbing / cyanosis. No joint deformity upper and lower extremities.   Skin: no significant rashes, lesions, ulcers. Warm, dry, well-perfused. Neurologic: CN 2-12 grossly intact. Moving all extremities. Alert and oriented.  Psychiatric: Calm. Cooperative.    Labs and Imaging on Admission: I have personally reviewed following labs and imaging studies  CBC: Recent Labs  Lab 02/15/22 2340  WBC 8.7  NEUTROABS 6.2  HGB 6.8*  HCT 22.0*  MCV 91.3  PLT 25660 Basic Metabolic Panel: Recent Labs  Lab 02/15/22 2340  NA 130*  K 3.8  CL 97*  CO2 23  GLUCOSE 135*  BUN 31*  CREATININE 1.27*  CALCIUM 7.6*   GFR: Estimated Creatinine Clearance: 32.7 mL/min (A) (by C-G formula based on SCr of 1.27 mg/dL (H)). Liver Function Tests: Recent Labs  Lab 02/15/22 2340  AST 23  ALT 6  ALKPHOS 58  BILITOT 0.4  PROT 7.0  ALBUMIN 2.3*   No results for input(s): "LIPASE", "AMYLASE" in the last 168 hours. No results for input(s): "AMMONIA" in the last 168 hours. Coagulation Profile:  Recent Labs  Lab 02/15/22 2340  INR 1.3*   Cardiac Enzymes: No results for  input(s): "CKTOTAL", "CKMB", "CKMBINDEX", "TROPONINI" in the last 168 hours. BNP (last 3 results) No results for input(s): "PROBNP" in the last 8760 hours. HbA1C: No results for input(s): "HGBA1C" in the last 72 hours. CBG: No results for input(s): "GLUCAP" in the last 168 hours. Lipid Profile: No results for input(s): "CHOL", "HDL", "LDLCALC", "TRIG", "CHOLHDL", "LDLDIRECT" in the last 72 hours. Thyroid Function Tests: No results for input(s): "TSH", "T4TOTAL", "FREET4", "T3FREE", "THYROIDAB" in the last 72 hours. Anemia Panel: No results for input(s): "VITAMINB12", "FOLATE", "FERRITIN", "TIBC", "IRON", "RETICCTPCT" in the last 72 hours. Urine analysis:    Component Value Date/Time   COLORURINE YELLOW 01/29/2022 2032   APPEARANCEUR HAZY (A) 01/29/2022 2032   APPEARANCEUR Clear 09/10/2020 1110   LABSPEC 1.014 01/29/2022 2032   PHURINE 5.0 01/29/2022 2032   GLUCOSEU >=500 (A) 01/29/2022 2032   HGBUR NEGATIVE 01/29/2022 2032   BILIRUBINUR NEGATIVE 01/29/2022 2032   BILIRUBINUR Negative 09/10/2020 [place] 01/29/2022 2032   PROTEINUR 30 (A) 01/29/2022 2032   UROBILINOGEN 0.2 05/27/2008 1117   NITRITE NEGATIVE 01/29/2022 2032   LEUKOCYTESUR NEGATIVE 01/29/2022 2032   Sepsis Labs: _0 (procalcitonin:4,lacticidven:4) )No results found for this or any previous visit (from the past 240 hour(s)).   Radiological Exams on Admission: No results found.   Assessment/Plan   1. Epistaxis; symptomatic anemia  - Presents with lightheadedness after ~10 hours of epistaxis and found to have Hgb 6.8 (8.2 on November 24th)  - Rhino Rocket placed bilaterally in ED and bleeding appears to have stopped  - 2 units RBC transfusing in ED - Hold aspirin and Plavix initially, monitor for rebleeding, check post-transfusion H&H    2. Hyponatremia  - Serum sodium 130 on admission in setting of hypovolemia  - He was given 1 liter NS in ED, will repeat chem panel in am   3.  Ischemic cardiomyopathy  - EF 50-55% on TTE in November 2023  - Appears hypovolemic in ED  - Hold diuretics and beta-blocker initially in light of hypovolemia and low-normal BP, monitor daily wt and I/Os    4. MSSA bacteremia  - Repeat blood cultures were negative, he had negative TEE, and source was not identified during recent admission  - Continue Ancef with end date 02/28/22    5. CAD  - No anginal complaints  - Antiplatelets held on admission in light of epistaxis with symptomatic anemia    6. COPD  - Not in exacerbation on admission  - Continue as-needed albuterol     DVT prophylaxis: SCDs  Code Status: Full  Level of Care: Level of care: Progressive Family Communication: wife at bedside  Disposition Plan:  Patient is from: home  Anticipated d/c is to: home  Anticipated d/c date is: 02/17/22  Patient currently: Pending resolution of epistaxis and stable H&H  Consults called: none  Admission status: Observation     Vianne Bulls, MD [facility]  02/16/2022, 1:28 AM

## 2022-02-17 DIAGNOSIS — Z9861 Coronary angioplasty status: Secondary | ICD-10-CM | POA: Diagnosis not present

## 2022-02-17 DIAGNOSIS — T45525A Adverse effect of antithrombotic drugs, initial encounter: Secondary | ICD-10-CM | POA: Diagnosis present

## 2022-02-17 DIAGNOSIS — I5022 Chronic systolic (congestive) heart failure: Secondary | ICD-10-CM | POA: Diagnosis present

## 2022-02-17 DIAGNOSIS — J449 Chronic obstructive pulmonary disease, unspecified: Secondary | ICD-10-CM | POA: Diagnosis present

## 2022-02-17 DIAGNOSIS — I255 Ischemic cardiomyopathy: Secondary | ICD-10-CM | POA: Diagnosis present

## 2022-02-17 DIAGNOSIS — Z8249 Family history of ischemic heart disease and other diseases of the circulatory system: Secondary | ICD-10-CM | POA: Diagnosis not present

## 2022-02-17 DIAGNOSIS — Z7901 Long term (current) use of anticoagulants: Secondary | ICD-10-CM | POA: Diagnosis not present

## 2022-02-17 DIAGNOSIS — Z825 Family history of asthma and other chronic lower respiratory diseases: Secondary | ICD-10-CM | POA: Diagnosis not present

## 2022-02-17 DIAGNOSIS — Z800 Family history of malignant neoplasm of digestive organs: Secondary | ICD-10-CM | POA: Diagnosis not present

## 2022-02-17 DIAGNOSIS — B9561 Methicillin susceptible Staphylococcus aureus infection as the cause of diseases classified elsewhere: Secondary | ICD-10-CM | POA: Diagnosis present

## 2022-02-17 DIAGNOSIS — R040 Epistaxis: Secondary | ICD-10-CM | POA: Diagnosis present

## 2022-02-17 DIAGNOSIS — E871 Hypo-osmolality and hyponatremia: Secondary | ICD-10-CM | POA: Diagnosis present

## 2022-02-17 DIAGNOSIS — G2581 Restless legs syndrome: Secondary | ICD-10-CM | POA: Diagnosis present

## 2022-02-17 DIAGNOSIS — F17210 Nicotine dependence, cigarettes, uncomplicated: Secondary | ICD-10-CM | POA: Diagnosis present

## 2022-02-17 DIAGNOSIS — D6832 Hemorrhagic disorder due to extrinsic circulating anticoagulants: Secondary | ICD-10-CM | POA: Diagnosis present

## 2022-02-17 DIAGNOSIS — D62 Acute posthemorrhagic anemia: Secondary | ICD-10-CM | POA: Diagnosis present

## 2022-02-17 DIAGNOSIS — K920 Hematemesis: Secondary | ICD-10-CM | POA: Diagnosis present

## 2022-02-17 DIAGNOSIS — I2510 Atherosclerotic heart disease of native coronary artery without angina pectoris: Secondary | ICD-10-CM | POA: Diagnosis present

## 2022-02-17 DIAGNOSIS — Z955 Presence of coronary angioplasty implant and graft: Secondary | ICD-10-CM | POA: Diagnosis not present

## 2022-02-17 DIAGNOSIS — J3489 Other specified disorders of nose and nasal sinuses: Secondary | ICD-10-CM | POA: Diagnosis present

## 2022-02-17 DIAGNOSIS — Z83438 Family history of other disorder of lipoprotein metabolism and other lipidemia: Secondary | ICD-10-CM | POA: Diagnosis not present

## 2022-02-17 DIAGNOSIS — E785 Hyperlipidemia, unspecified: Secondary | ICD-10-CM | POA: Diagnosis present

## 2022-02-17 DIAGNOSIS — R7881 Bacteremia: Secondary | ICD-10-CM | POA: Diagnosis present

## 2022-02-17 DIAGNOSIS — I110 Hypertensive heart disease with heart failure: Secondary | ICD-10-CM | POA: Diagnosis present

## 2022-02-17 DIAGNOSIS — E861 Hypovolemia: Secondary | ICD-10-CM | POA: Diagnosis present

## 2022-02-17 DIAGNOSIS — D649 Anemia, unspecified: Secondary | ICD-10-CM | POA: Diagnosis not present

## 2022-02-17 DIAGNOSIS — N400 Enlarged prostate without lower urinary tract symptoms: Secondary | ICD-10-CM | POA: Diagnosis present

## 2022-02-17 LAB — TYPE AND SCREEN
ABO/RH(D): A POS
Antibody Screen: NEGATIVE
Unit division: 0
Unit division: 0

## 2022-02-17 LAB — CBC
HCT: 26.4 % — ABNORMAL LOW (ref 39.0–52.0)
Hemoglobin: 8.6 g/dL — ABNORMAL LOW (ref 13.0–17.0)
MCH: 27.7 pg (ref 26.0–34.0)
MCHC: 32.6 g/dL (ref 30.0–36.0)
MCV: 84.9 fL (ref 80.0–100.0)
Platelets: 212 10*3/uL (ref 150–400)
RBC: 3.11 MIL/uL — ABNORMAL LOW (ref 4.22–5.81)
RDW: 17.2 % — ABNORMAL HIGH (ref 11.5–15.5)
WBC: 8.8 10*3/uL (ref 4.0–10.5)
nRBC: 0 % (ref 0.0–0.2)

## 2022-02-17 LAB — BPAM RBC
Blood Product Expiration Date: 202312182359
Blood Product Expiration Date: 202312182359
ISSUE DATE / TIME: 202312050103
ISSUE DATE / TIME: 202312050404
Unit Type and Rh: 6200
Unit Type and Rh: 6200

## 2022-02-17 LAB — BASIC METABOLIC PANEL
Anion gap: 8 (ref 5–15)
BUN: 20 mg/dL (ref 8–23)
CO2: 23 mmol/L (ref 22–32)
Calcium: 7.7 mg/dL — ABNORMAL LOW (ref 8.9–10.3)
Chloride: 100 mmol/L (ref 98–111)
Creatinine, Ser: 1.08 mg/dL (ref 0.61–1.24)
GFR, Estimated: >60 mL/min (ref >60)
Glucose, Bld: 90 mg/dL (ref 70–99)
Potassium: 3.7 mmol/L (ref 3.5–5.1)
Sodium: 131 mmol/L — ABNORMAL LOW (ref 135–145)

## 2022-02-17 MED ORDER — SODIUM CHLORIDE 0.9 % IV SOLN: INTRAVENOUS | Status: AC

## 2022-02-17 MED ORDER — SODIUM CHLORIDE 0.9% FLUSH: 10.0000 mL | INTRAVENOUS | Status: DC | PRN

## 2022-02-17 MED ORDER — SODIUM CHLORIDE 0.9% FLUSH
10.0000 mL | Freq: Two times a day (BID) | INTRAVENOUS | Status: DC
  Administered 2022-02-17 – 2022-02-19 (×3): 10 mL

## 2022-02-17 NOTE — Assessment & Plan Note (Signed)
Patient presented with symptomatic epistaxis causing decrease in hemoglobin and persisting for about a day.  History of prior epistaxis but they normally resolved spontaneously.  Patient was on aspirin and Plavix which were held. S/p Rhino Rocket placed bilaterally, ENT is on board -Keep holding aspirin and Plavix -Packaging will remain in for 2 to 3 days and patient need to stay in hospital per ENT.

## 2022-02-17 NOTE — Consult Note (Signed)
Reason for Consult:epistaxis Referring Physician: Dr Harless Litten. is an 81 y.o. male.  HPI: hx of epistaxis yesterday that was packed in ER. He has essentially no significant bleeding at this point. He has many medical issues. He has heparin that is for his IV treatment and on Plavix. He has very slight ooze. He has been packed since just yesterday. He does have hx of nose bleeding.   Past Medical History:  Diagnosis Date   Adenomatous colon polyp    Aortic aneurysm (HCC)    BPH (benign prostatic hypertrophy)    CAD (coronary artery disease)    Carotid artery stenosis    a. Bilateral CEA   Cataract    CHF (congestive heart failure) (HCC)    Chronic systolic heart failure (HCC)    a. EF 20-25%, mild LVH, mod HK, mid apicalanteroseptal myocardium, mild MR, LA mod dilated   Collagen vascular disease (HCC)    COPD (chronic obstructive pulmonary disease) ([place])    Coronary artery disease    a. LHC (08/2013): Lmain: short 30% distal, LAD: sml D1 & D2, 70% ostial D1, 95-99% LAD stenosis prox D2 LCx: sml/mod ramus subtot. occluded, 40% ostial set off lg OM1, 40% AV LCx after OM1, RCA: 90% prox (DES to RCA and prox LAD)   Diverticulosis    GERD (gastroesophageal reflux disease)    Heart murmur    History of colonic polyps    Hyperlipidemia    Hyperplastic colon polyp    Hypertension    Ischemic cardiomyopathy    RLS (restless legs syndrome)     Past Surgical History:  Procedure Laterality Date   BUBBLE STUDY  02/03/2022   Procedure: BUBBLE STUDY;  Surgeon: Freada Bergeron, MD;  Location: [place];  Service: Cardiovascular;;   cardiac stents  09-2013   CAROTID ENDARTERECTOMY  04/17/2008   right   CAROTID ENDARTERECTOMY  05/30/08   Left   CATARACT EXTRACTION W/PHACO Right 10/05/2018   Procedure: CATARACT EXTRACTION PHACO AND INTRAOCULAR LENS PLACEMENT ([place]), RIGHT;  Surgeon: Birder Robson, MD;  Location: [facility] ORS;  Service: Ophthalmology;  Laterality: Right;   [place] 01:06.4 cde 12.79 Fluid Pack Lot # [id number]   CATARACT EXTRACTION W/PHACO Left 11/02/2018   Procedure: CATARACT EXTRACTION PHACO AND INTRAOCULAR LENS PLACEMENT ([place]), LEFT;  Surgeon: Birder Robson, MD;  Location: [facility];  Service: Ophthalmology;  Laterality: Left;  [place]  01:10 CDE 11.52 Fluid pack lot # [id number]   CHOLECYSTECTOMY     Gall Bladder   CORONARY ANGIOGRAM  09/11/2013   Procedure: CORONARY ANGIOGRAM;  Surgeon: Troy Sine, MD;  Location: [facility] CATH LAB;  Service: Cardiovascular;;   CORONARY ANGIOPLASTY     STENTS   HERNIA REPAIR     lower aorta aneurysm  05/26/2015   [facility]   PERCUTANEOUS STENT INTERVENTION  09/11/2013   Procedure: PERCUTANEOUS STENT INTERVENTION;  Surgeon: Troy Sine, MD;  Location: [facility] CATH LAB;  Service: Cardiovascular;;  DES Prox RCA 3.5x15 xience    TEE WITHOUT CARDIOVERSION N/A 02/03/2022   Procedure: TRANSESOPHAGEAL ECHOCARDIOGRAM (TEE);  Surgeon: Freada Bergeron, MD;  Location: [facility] ENDOSCOPY;  Service: Cardiovascular;  Laterality: N/A;    Family History  Problem Relation Age of Onset   Heart disease Brother    Hyperlipidemia Brother    Pancreatic cancer Brother    Hypertension Mother    Cancer Father        unknown type; sounds GI   Emphysema Father    Hyperlipidemia Brother  Hyperlipidemia Brother    Heart attack Brother    Hyperlipidemia Brother    Multiple sclerosis Brother    Kidney disease Neg Hx    Prostate cancer Neg Hx    Colon cancer Neg Hx    Esophageal cancer Neg Hx    Rectal cancer Neg Hx    Stomach cancer Neg Hx     Social History:  reports that he has been smoking cigarettes. He has a 50.00 pack-year smoking history. He uses smokeless tobacco. He reports that he does not drink alcohol and does not use drugs.  Allergies:  Allergies  Allergen Reactions   Codeine Anaphylaxis and Swelling    throat swells    Medications: I have reviewed the patient's current medications.  Results for orders placed or  performed during the hospital encounter of 02/15/22 (from the past 48 hour(s))  CBC with Differential     Status: Abnormal   Collection Time: 02/15/22 11:40 PM  Result Value Ref Range   WBC 8.7 4.0 - 10.5 K/uL   RBC 2.41 (L) 4.22 - 5.81 MIL/uL   Hemoglobin 6.8 (LL) 13.0 - 17.0 g/dL    Comment: REPEATED TO VERIFY THIS CRITICAL RESULT HAS VERIFIED AND BEEN CALLED TO RN [place] ON 12 05 2023 AT 0019, AND HAS BEEN READ BACK.     HCT 22.0 (L) 39.0 - 52.0 %   MCV 91.3 80.0 - 100.0 fL   MCH 28.2 26.0 - 34.0 pg   MCHC 30.9 30.0 - 36.0 g/dL   RDW 14.9 11.5 - 15.5 %   Platelets 255 150 - 400 K/uL   nRBC 0.0 0.0 - 0.2 %   Neutrophils Relative % 72 %   Neutro Abs 6.2 1.7 - 7.7 K/uL   Lymphocytes Relative 13 %   Lymphs Abs 1.2 0.7 - 4.0 K/uL   Monocytes Relative 10 %   Monocytes Absolute 0.9 0.1 - 1.0 K/uL   Eosinophils Relative 3 %   Eosinophils Absolute 0.3 0.0 - 0.5 K/uL   Basophils Relative 2 %   Basophils Absolute 0.1 0.0 - 0.1 K/uL   Immature Granulocytes 0 %   Abs Immature Granulocytes 0.03 0.00 - 0.07 K/uL    Comment: Performed at [facility] [address] [place], [place] [address]  Comprehensive metabolic panel     Status: Abnormal   Collection Time: 02/15/22 11:40 PM  Result Value Ref Range   Sodium 130 (L) 135 - 145 mmol/L   Potassium 3.8 3.5 - 5.1 mmol/L   Chloride 97 (L) 98 - 111 mmol/L   CO2 23 22 - 32 mmol/L   Glucose, Bld 135 (H) 70 - 99 mg/dL    Comment: Glucose reference range applies only to samples taken after fasting for at least 8 hours.   BUN 31 (H) 8 - 23 mg/dL   Creatinine, Ser 1.27 (H) 0.61 - 1.24 mg/dL   Calcium 7.6 (L) 8.9 - 10.3 mg/dL   Total Protein 7.0 6.5 - 8.1 g/dL   Albumin 2.3 (L) 3.5 - 5.0 g/dL   AST 23 15 - 41 U/L   ALT 6 0 - 44 U/L   Alkaline Phosphatase 58 38 - 126 U/L   Total Bilirubin 0.4 0.3 - 1.2 mg/dL   GFR, Estimated 57 (L) >60 mL/min    Comment: (NOTE) Calculated using the CKD-EPI Creatinine Equation  (2021)    Anion gap 10 5 - 15    Comment: Performed at [facility]  Lab, [address] [place], [place] [address]  Type and screen     Status: None   Collection Time: 02/15/22 11:40 PM  Result Value Ref Range   ABO/RH(D) A POS    Antibody Screen NEG    Sample Expiration 02/18/2022,2359    Unit Number [id number]    Blood Component Type RED CELLS,LR    Unit division 00    Status of Unit ISSUED,FINAL    Transfusion Status OK TO TRANSFUSE    Crossmatch Result Compatible    Unit Number [id number]    Blood Component Type RED CELLS,LR    Unit division 00    Status of Unit ISSUED,FINAL    Transfusion Status OK TO TRANSFUSE    Crossmatch Result      Compatible Performed at [facility], [place] [address] [place], [place] [address]   Protime-INR     Status: Abnormal   Collection Time: 02/15/22 11:40 PM  Result Value Ref Range   Prothrombin Time 16.1 (H) 11.4 - 15.2 seconds   INR 1.3 (H) 0.8 - 1.2    Comment: (NOTE) INR goal varies based on device and disease states. Performed at [facility], [place] [address] [place], [place] [address]   APTT     Status: None   Collection Time: 02/15/22 11:40 PM  Result Value Ref Range   aPTT 33 24 - 36 seconds    Comment: Performed at [facility] [address], [place], [place] [address]  Prepare RBC (crossmatch)     Status: None   Collection Time: 02/16/22 12:22 AM  Result Value Ref Range   Order Confirmation      ORDER PROCESSED BY BLOOD BANK Performed at [facility], [place] [address], [place], [place] [address]   Basic metabolic panel     Status: Abnormal   Collection Time: 02/16/22  6:59 AM  Result Value Ref Range   Sodium 133 (L) 135 - 145 mmol/L   Potassium 4.3 3.5 - 5.1 mmol/L   Chloride 101 98 - 111 mmol/L   CO2 24 22 - 32 mmol/L   Glucose, Bld 104 (H) 70 - 99 mg/dL    Comment: Glucose reference range applies only to samples taken after fasting for at least 8 hours.   BUN 28 (H) 8 - 23 mg/dL    Creatinine, Ser 1.10 0.61 - 1.24 mg/dL   Calcium 7.5 (L) 8.9 - 10.3 mg/dL   GFR, Estimated >60 >60 mL/min    Comment: (NOTE) Calculated using the CKD-EPI Creatinine Equation (2021)    Anion gap 8 5 - 15    Comment: Performed at [facility] [address] [place], [place] [address]  Magnesium     Status: None   Collection Time: 02/16/22  6:59 AM  Result Value Ref Range   Magnesium 1.9 1.7 - 2.4 mg/dL    Comment: Performed at [facility] [address] [place] [address]  CBC     Status: Abnormal   Collection Time: 02/16/22  9:29 AM  Result Value Ref Range   WBC 7.7 4.0 - 10.5 K/uL   RBC 2.87 (L) 4.22 - 5.81 MIL/uL   Hemoglobin 8.1 (L) 13.0 - 17.0 g/dL    Comment: REPEATED TO VERIFY POST TRANSFUSION SPECIMEN    HCT 24.1 (L) 39.0 - 52.0 %   MCV 84.0 80.0 - 100.0 fL    Comment: POST TRANSFUSION SPECIMEN REPEATED TO VERIFY  MCH 28.2 26.0 - 34.0 pg   MCHC 33.6 30.0 - 36.0 g/dL   RDW 17.6 (H) 11.5 - 15.5 %   Platelets 175 150 - 400 K/uL    Comment: REPEATED TO VERIFY   nRBC 0.0 0.0 - 0.2 %    Comment: Performed at [facility], [place] [address], [place], [place] [address]  Basic metabolic panel     Status: Abnormal   Collection Time: 02/17/22  2:35 AM  Result Value Ref Range   Sodium 131 (L) 135 - 145 mmol/L   Potassium 3.7 3.5 - 5.1 mmol/L   Chloride 100 98 - 111 mmol/L   CO2 23 22 - 32 mmol/L   Glucose, Bld 90 70 - 99 mg/dL    Comment: Glucose reference range applies only to samples taken after fasting for at least 8 hours.   BUN 20 8 - 23 mg/dL   Creatinine, Ser 1.08 0.61 - 1.24 mg/dL   Calcium 7.7 (L) 8.9 - 10.3 mg/dL   GFR, Estimated >60 >60 mL/min    Comment: (NOTE) Calculated using the CKD-EPI Creatinine Equation (2021)    Anion gap 8 5 - 15    Comment: Performed at [facility] [address] [place], [place] [address]  CBC     Status: Abnormal   Collection Time: 02/17/22  2:35 AM  Result Value Ref Range   WBC 8.8 4.0 -  10.5 K/uL   RBC 3.11 (L) 4.22 - 5.81 MIL/uL   Hemoglobin 8.6 (L) 13.0 - 17.0 g/dL   HCT 26.4 (L) 39.0 - 52.0 %   MCV 84.9 80.0 - 100.0 fL   MCH 27.7 26.0 - 34.0 pg   MCHC 32.6 30.0 - 36.0 g/dL   RDW 17.2 (H) 11.5 - 15.5 %   Platelets 212 150 - 400 K/uL   nRBC 0.0 0.0 - 0.2 %    Comment: Performed at [facility], [place] [address], [place],  [address]    No results found.  ROS Blood pressure (!) 134/48, pulse 93, temperature 98.4 F (36.9 C), resp. rate 16, height '5\' 6"'$  (1.676 m), weight 49.9 kg, SpO2 94 %. Physical Exam HENT:     Head: Normocephalic.     Nose:     Comments: Right side nose has a dual balloon posterior pack and left with single. There is no active bleeding just some clot at the anterior vestibule bilaterally. He occasional pushes a gauze to his nose and gets a small stain on it.     Mouth/Throat:     Comments: No blood Eyes:     Pupils: Pupils are equal, round, and reactive to light.  Neurological:     Mental Status: He is alert.       Assessment/Plan: Epistaxis- we discussed the options. He currently is essentailly not bleeding for just over 24 hours of packing. We discussed removal of these packs and examination for any areas for cautery but will most likely have to repack. We discussed giving him another day or two to see if he will resolve and not bleed. Also discussed IR. He for now will choose stay the coarse and keep these packs. Since he has bilateral packs and a posterior pack he needs to stay in hospital. Packs out in about 3 more days. Staph covering antibiotic while packs in. If he starts bleeding again through the weekend will need to call [facility] ENT to assist.   Melissa Montane 02/17/2022, 11:09 AM

## 2022-02-17 NOTE — TOC Initial Note (Signed)
Transition of Care (TOC) - Initial/Assessment Note    Patient Details  Name: Erik [name]. MRN: [id number] Date of Birth: 1940-05-23  Transition of Care [facility]) CM/SW Contact:    Erik Labrum, RN Phone Number: 02/17/2022, 4:21 PM  Clinical Narrative:                 Cm met with the patient at the bedside to discuss transitions of care needs.  The patient was admitted for nosebleed and Dr. Janace Hoard consulted - bilateral rhino-rockets noted.  The patient states that rhino rockets to remain bilaterally for the next 5 days.  The patient was receiving IV antibiotics at home through Right arm PICC line through 02/28/2022.  The patient is currently active with [facility] for RN and [facility] for IV antibiotics delivery to the home.  [facility] orders placed for continued home health RN - both agencies placed in the AVS.  Patient recently received 2 units of PRBC for low hgb.  CM will continue to follow the patient for [facility] needs for home - discharge date pending medical stability.  Expected Discharge Plan: [place] Barriers to Discharge: Continued Medical Work up   Patient Goals and CMS Choice Patient states their goals for this hospitalization and ongoing recovery are:: to get better and return home with wife CMS Medicare.gov Compare Post Acute Care list provided to:: Patient Choice offered to / list presented to : Patient  Expected Discharge Plan and Services Expected Discharge Plan: [place]   Discharge Planning Services: CM Consult Post Acute Care Choice: [place] arrangements for the past 2 months: Single Family Home                           HH Arranged: RN [place] Agency: [place] (Erik [name]) Date [place]: 02/17/22 Time [place]: 1620 Representative spoke with at [place]: Erik [place], RN with [facility]  Prior Living Arrangements/Services Living arrangements for the past 2 months: [place] with:: Spouse Patient language and need for interpreter reviewed:: Yes Do you feel safe going back to the place where you live?: Yes      Need for Family Participation in Patient Care: Yes (Comment) Care giver support system in place?: Yes (comment) Current home services: Home RN [facility] active for IV antibiotics through 02/28/2022 via Right arm PICC line) Criminal Activity/Legal Involvement Pertinent to Current Situation/Hospitalization: No - Comment as needed  Activities of Daily Living Home Assistive Devices/Equipment: None ADL Screening (condition at time of admission) Patient's cognitive ability adequate to safely complete daily activities?: Yes Is the patient deaf or have difficulty hearing?: No Does the patient have difficulty seeing, even when wearing glasses/contacts?: No Does the patient have difficulty concentrating, remembering, or making decisions?: No Patient able to express need for assistance with ADLs?: No Does the patient have difficulty dressing or bathing?: No Independently performs ADLs?: Yes (appropriate for developmental age) Does the patient have difficulty walking or climbing stairs?: No Weakness of Legs: None Weakness of Arms/Hands: None  Permission Sought/Granted Permission sought to share information with : Case Manager, Family Supports       Permission granted to share info w AGENCY: [facility] for [facility] RN, [facility] - Erik [name], RNCM is following for IV medications for home  Permission granted to share info w Relationship: spouse - Erik [name] - [phone]     Emotional Assessment Appearance:: Appears stated age  Attitude/Demeanor/Rapport: Gracious Affect (typically observed): Accepting Orientation: : Oriented to Self, Oriented to Place, Oriented to  Time, Oriented to Situation Alcohol / Substance Use: Not Applicable Psych Involvement: No (comment)  Admission diagnosis:  Epistaxis [R04.0] Symptomatic anemia [D64.9] Patient Active Problem  List   Diagnosis Date Noted   Epistaxis 02/16/2022   Symptomatic anemia 02/16/2022   Hyponatremia 02/16/2022   MSSA bacteremia 02/03/2022   Protein-calorie malnutrition, severe 02/01/2022   Shock ([place]) 01/30/2022   Iron deficiency anemia secondary to inadequate dietary iron intake 11/23/2021   Family history of B12 deficiency 11/23/2021   Decreased appetite 10/11/2021   Epigastric pain 10/11/2021   Weight loss 07/14/2021   Left groin pain 09/04/2020   Hydrocele in adult 09/04/2020   Epididymitis 09/04/2020   Bronchiectasis ([place]) 10/24/2019   Restless leg syndrome 05/30/2018   Abdominal aortic aneurysm (AAA) ([place]) 07/07/2015   Arteriosclerosis of coronary artery 07/07/2015   Cardiomyopathy, ischemic 07/07/2015   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   Solitary pulmonary nodule 04/16/2014   Counseling regarding end of life decision making 04/16/2014   HTN (hypertension) 10/03/2013   CAD S/P percutaneous coronary angioplasty 42/68/3419   Chronic systolic heart failure ([place]) 09/19/2013   NSTEMI (non-ST elevated myocardial infarction) ([place]) 09/10/2013   Family history of coronary artery disease in brother 09/10/2013   ED (erectile dysfunction) 12/10/2011   COPD, mild ([place]) 06/20/2008   COLONIC POLYPS, ADENOMATOUS 06/05/2008   BPH (benign prostatic hyperplasia) 06/05/2008   Prediabetes 05/14/2008   VENEREAL WART 05/07/2008   ONYCHOMYCOSIS, TOENAILS 05/07/2008   Hyperlipidemia 05/07/2008   Bilateral carotid artery stenosis 05/07/2008   INTERMITTENT CLAUDICATION 05/07/2008   HEART MURMUR, HX OF 05/07/2008   COLONIC POLYPS, HX OF 05/07/2008   PCP:  Erik Sanders, MD Pharmacy:   [id number] [id number]: [phone]: [phone]    Social Determinants of Health (SDOH) Interventions    Readmission Risk Interventions    02/17/2022    4:21 PM  Readmission Risk  Prevention Plan  Transportation Screening Complete  PCP or Specialist Appt within 3-5 Days Complete  [place] or [place]  Social Work Consult for RKakePlanning/Counseling Complete  Palliative Care Screening Complete  Medication Review (Press photographer Complete

## 2022-02-17 NOTE — Assessment & Plan Note (Signed)
-   Not in exacerbation on admission  - Continue as-needed albuterol

## 2022-02-17 NOTE — Assessment & Plan Note (Signed)
Most likely secondary to hypovolemia.  Some improvement of sodium to 131 after getting IV fluid. -Continue IV fluid for another day -Monitor sodium

## 2022-02-17 NOTE — Progress Notes (Signed)
Progress Note   Patient: Erik [name]. HEN:[id number] DOB: 22-Jul-1940 [name]: 02/15/2022     0 DOS: the patient was seen and examined on 02/17/2022   Brief hospital course: Taken from prior notes.   Duc Crocket. is a 81 y.o. male with medical history significant for CAD, ischemic cardiomyopathy, mild COPD, and recent admission for MSSA bacteremia who now presents to the emergency department with epistaxis.  Patient has an history of prior epistaxis but they normally resolved spontaneously. Patient also vomited dark red blood.  Having some associated lightheadedness.  Denies chest pain.  On arrival to ED patient was afebrile, borderline soft blood pressure.  Hemoglobin at 6.8, sodium 130, creatinine 1.27.  Rhino Rocket was placed bilaterally, 1 L normal saline and 0.5 mg IV Dilaudid were administered, and transfusion of 2 units packed RBCs was initiated.   12/6: Hemoglobin improved to 8.6 after getting 2 unit of PRBC.  Continue to have some oozing despite packaging for more than 24 hours.  Ask ENT to reevaluate. They were recommending continuation of packing for next 2 to 3 days as patient would like to be as conservative as possible, as he has bilateral packing along with a posterior pack they were recommending staph coverage , patient with recent history of MSSA bacteremia and he will continue Ancef with end date of 02/28/2022. Patient need to stay in hospital until packages were removed by ENT. If epistaxis get worse please contact [facility] ENT.      Assessment and Plan: * Epistaxis Patient presented with symptomatic epistaxis causing decrease in hemoglobin and persisting for about a day.  History of prior epistaxis but they normally resolved spontaneously.  Patient was on aspirin and Plavix which were held. S/p Rhino Rocket placed bilaterally, ENT is on board -Keep holding aspirin and Plavix -Packaging will remain in for 2 to 3 days and patient need to stay in hospital  per ENT.  Symptomatic anemia Secondary to significant epistaxis, hemoglobin at 6.8 on admission which has improved to 8.6 after getting 2 unit of PRBC -Monitor hemoglobin -Transfuse if below 7  MSSA bacteremia Patient with recent history of MSSA bacteremia.  Repeat blood cultures were negative.  he had negative TEE, and source was not identified during recent admission  - Continue Ancef with end date 02/28/22    Hyponatremia Most likely secondary to hypovolemia.  Some improvement of sodium to 131 after getting IV fluid. -Continue IV fluid for another day -Monitor sodium  CAD S/P percutaneous coronary angioplasty - No anginal complaints  - Antiplatelets held on admission in light of epistaxis with symptomatic anemia    COPD, mild (HCC) - Not in exacerbation on admission  - Continue as-needed albuterol    Subjective: Patient was complaining of pain at the mouth roof, no obvious lesion, may be with a pressure from picking up in the nose.  Continued to have little bit of oozing through packing and both naris full of blood.  Physical Exam: Vitals:   02/17/22 0510 02/17/22 0735 02/17/22 0850 02/17/22 1334  BP: (!) 158/64 (!) 134/48  (!) 138/53  Pulse: 88 93  83  Resp: '18 16  19  '$ Temp: 98.4 F (36.9 C) 99.3 F (37.4 C) 98.4 F (36.9 C) 98.2 F (36.8 C)  TempSrc: Oral   Oral  SpO2: 95% 94%  99%  Weight:      Height:       General.  Frail gentleman, in no acute distress.  Bilateral Rhino Rocket's in  place, both naris full of dark-colored blood.  No obvious lesion on the roof of mouth Pulmonary.  Lungs clear bilaterally, normal respiratory effort. CV.  Regular rate and rhythm, no JVD, rub or murmur. Abdomen.  Soft, nontender, nondistended, BS positive. CNS.  Alert and oriented .  No focal neurologic deficit. Extremities.  No edema, no cyanosis, pulses intact and symmetrical. Psychiatry.  Judgment and insight appears normal.   Data Reviewed: Prior data reviewed  Family  Communication: Discussed with wife at bedside  Disposition: Status is: Inpatient Remains inpatient appropriate because: Severity of illness  Planned Discharge Destination: Home  DVT prophylaxis.  SCDs Time spent: 45 minutes  This record has been created using Systems analyst. Errors have been sought and corrected,but may not always be located. Such creation errors do not reflect on the standard of care.   Author: Lorella Nimrod, MD 02/17/2022 3:30 PM  For on call review [email or web address].

## 2022-02-17 NOTE — Assessment & Plan Note (Signed)
Secondary to significant epistaxis, hemoglobin at 6.8 on admission which has improved to 8.6 after getting 2 unit of PRBC -Monitor hemoglobin -Transfuse if below 7

## 2022-02-17 NOTE — Assessment & Plan Note (Signed)
Patient with recent history of MSSA bacteremia.  Repeat blood cultures were negative.  he had negative TEE, and source was not identified during recent admission  - Continue Ancef with end date 02/28/22

## 2022-02-17 NOTE — Hospital Course (Addendum)
Taken from prior notes.   Erik [name]. is a 81 y.o. male with medical history significant for CAD, ischemic cardiomyopathy, mild COPD, and recent admission for MSSA bacteremia who now presents to the emergency department with epistaxis.  Patient has an history of prior epistaxis but they normally resolved spontaneously. Patient also vomited dark red blood.  Having some associated lightheadedness.  Denies chest pain.  On arrival to ED patient was afebrile, borderline soft blood pressure.  Hemoglobin at 6.8, sodium 130, creatinine 1.27.  Rhino Rocket was placed bilaterally, 1 L normal saline and 0.5 mg IV Dilaudid were administered, and transfusion of 2 units packed RBCs was initiated.   12/6: Hemoglobin improved to 8.6 after getting 2 unit of PRBC.  Continue to have some oozing despite packaging for more than 24 hours.  Ask ENT to reevaluate. They were recommending continuation of packing for next 2 to 3 days as patient would like to be as conservative as possible, as he has bilateral packing along with a posterior pack they were recommending staph coverage , patient with recent history of MSSA bacteremia and he will continue Ancef with end date of 02/28/2022.  12/9: Patient remained stable but did need another unit of blood when hemoglobin dropped to 7.3 after improving to 8.6.  Total of 3 units was given and hemoglobin on the day of discharge was 9.  Discussed with the ENT again and they remove the packing with no active bleeding.  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  He was advised to use saline nasal spray every hour while awake which he need to do for rest of his life per ENT note.  He can use Afrin nasal spray for any bleeding. He was advised to come to the hospital if unable to control bleeding with above-mentioned measures.  Patient can restart his home aspirin and Plavix from tomorrow.  Need to discuss with his  cardiologist the need to continue on both or if he can stop 1 of that medicine to decrease his risk of bleeding.  He will continue his antibiotics as planned before, his end date is 02/28/2022.  Patient will continue with rest of his home medications and need to have a close follow-up with his providers for further recommendations.

## 2022-02-17 NOTE — Assessment & Plan Note (Signed)
-   No anginal complaints  - Antiplatelets held on admission in light of epistaxis with symptomatic anemia

## 2022-02-18 LAB — BASIC METABOLIC PANEL
Anion gap: 6 (ref 5–15)
BUN: 17 mg/dL (ref 8–23)
CO2: 26 mmol/L (ref 22–32)
Calcium: 7.6 mg/dL — ABNORMAL LOW (ref 8.9–10.3)
Chloride: 100 mmol/L (ref 98–111)
Creatinine, Ser: 1 mg/dL (ref 0.61–1.24)
GFR, Estimated: >60 mL/min (ref >60)
Glucose, Bld: 109 mg/dL — ABNORMAL HIGH (ref 70–99)
Potassium: 3.9 mmol/L (ref 3.5–5.1)
Sodium: 132 mmol/L — ABNORMAL LOW (ref 135–145)

## 2022-02-18 LAB — CBC
HCT: 23.5 % — ABNORMAL LOW (ref 39.0–52.0)
Hemoglobin: 7.5 g/dL — ABNORMAL LOW (ref 13.0–17.0)
MCH: 28 pg (ref 26.0–34.0)
MCHC: 31.9 g/dL (ref 30.0–36.0)
MCV: 87.7 fL (ref 80.0–100.0)
Platelets: 178 10*3/uL (ref 150–400)
RBC: 2.68 MIL/uL — ABNORMAL LOW (ref 4.22–5.81)
RDW: 17.2 % — ABNORMAL HIGH (ref 11.5–15.5)
WBC: 8.2 10*3/uL (ref 4.0–10.5)
nRBC: 0 % (ref 0.0–0.2)

## 2022-02-18 LAB — HEMOGLOBIN AND HEMATOCRIT, BLOOD
HCT: 23.4 % — ABNORMAL LOW (ref 39.0–52.0)
Hemoglobin: 7.3 g/dL — ABNORMAL LOW (ref 13.0–17.0)

## 2022-02-18 NOTE — Progress Notes (Signed)
Mobility Specialist Progress Note   02/18/22 1500  Mobility  Activity Ambulated with assistance in hallway  Level of Assistance Independent after set-up  Assistive Device Other (Comment) (IV Pole)  Distance Ambulated (ft) 160 ft  Range of Motion/Exercises Active;All extremities  Activity Response Tolerated well  Mobility Referral Yes   Patient received in supine and agreeable to participate. Ambulated independently with slow steady gait. Returned to room without complaint or incident. Was left in supine with all needs met, call bell in reach.   Erik [name], BS EXP Mobility Specialist Please contact via SecureChat or Rehab office at [phone]

## 2022-02-18 NOTE — Progress Notes (Addendum)
Consultation Progress Note   Patient: Erik [name]. WCH:[id number] DOB: 1941-01-29 [name]: 02/15/2022 DOS: the patient was seen and examined on 02/18/2022 Primary service: Randen Kauth, Manfred Shirts, MD  Brief hospital course: Taken from prior notes.   Erik Calvin. is a 81 y.o. male with medical history significant for CAD, ischemic cardiomyopathy, mild COPD, and recent admission for MSSA bacteremia who now presents to the emergency department with epistaxis.  Patient has an history of prior epistaxis but they normally resolved spontaneously. Patient also vomited dark red blood.  Having some associated lightheadedness.  Denies chest pain.  On arrival to ED patient was afebrile, borderline soft blood pressure.  Hemoglobin at 6.8, sodium 130, creatinine 1.27.  Rhino Rocket was placed bilaterally, 1 L normal saline and 0.5 mg IV Dilaudid were administered, and transfusion of 2 units packed RBCs was initiated.   12/6: Hemoglobin improved to 8.6 after getting 2 unit of PRBC.  Continue to have some oozing despite packaging for more than 24 hours.  Ask ENT to reevaluate. They were recommending continuation of packing for next 2 to 3 days as patient would like to be as conservative as possible, as he has bilateral packing along with a posterior pack they were recommending staph coverage , patient with recent history of MSSA bacteremia and he will continue Ancef with end date of 02/28/2022. Patient need to stay in hospital until packages were removed by ENT. If epistaxis get worse please contact [facility] ENT.      Assessment and Plan: * Epistaxis Patient presented with symptomatic epistaxis causing decrease in hemoglobin and persisting for about a day.  History of prior epistaxis but they normally resolved spontaneously.  Patient was on aspirin and Plavix which were held. S/p Rhino Rocket placed bilaterally, ENT is on board -Keep holding aspirin and Plavix -Packaging will remain in for 2 to 3  days and patient need to stay in hospital per ENT.  Acute Blood loss anemia Secondary to significant epistaxis, hemoglobin at 6.8 on admission which has improved to 8.6 after getting 2 unit of PRBC -Monitor hemoglobin -Transfuse if below 7  12/7-Hb dropped to 7.5 from 8.6, recheck Hb tonight, transfuse if less than 7  MSSA bacteremia Patient with recent history of MSSA bacteremia.  Repeat blood cultures were negative.  he had negative TEE, and source was not identified during recent admission  - Continue Ancef with end date 02/28/22    Hyponatremia Most likely secondary to hypovolemia.  Some improvement of sodium to 131 after getting IV fluid. -Continue IV fluid for another day -Monitor sodium  CAD S/P percutaneous coronary angioplasty - No anginal complaints  - Antiplatelets held on admission in light of epistaxis with symptomatic anemia    COPD, mild (HCC) - Not in exacerbation on admission  - Continue as-needed albuterol         TRH will continue to follow the patient.  Subjective: No acute overnight events  Physical Exam: Vitals:   02/17/22 1700 02/17/22 2125 02/18/22 0600 02/18/22 0737  BP: (!) 134/48 (!) 128/49 (!) 144/58 (!) 140/59  Pulse: 84 75 75 73  Resp: '18 17 16 18  '$ Temp: 98.9 F (37.2 C) 98.5 F (36.9 C) 97.7 F (36.5 C) 98.1 F (36.7 C)  TempSrc: Oral  Oral Oral  SpO2: 95% 97% 97% 98%  Weight:      Height:      General.  Frail gentleman, in no acute distress.  Bilateral Rhino Rocket's in place, No obvious lesion  on the roof of mouth Pulmonary.  Lungs clear bilaterally, normal respiratory effort. CV.  Regular rate and rhythm, no JVD, rub or murmur. Abdomen.  Soft, nontender, nondistended, BS positive. CNS.  Alert and oriented .  No focal neurologic deficit. Extremities.  No edema, no cyanosis, pulses intact and symmetrical. Psychiatry.  Judgment and insight appears normal.     Data Reviewed:  There are no new results to review at this  time.  Time spent: 15 minutes.  Author: Cristela Felt, MD 02/18/2022 12:23 PM  For on call review [email or web address].

## 2022-02-19 LAB — CBC
HCT: 23.2 % — ABNORMAL LOW (ref 39.0–52.0)
Hemoglobin: 7.3 g/dL — ABNORMAL LOW (ref 13.0–17.0)
MCH: 27.7 pg (ref 26.0–34.0)
MCHC: 31.5 g/dL (ref 30.0–36.0)
MCV: 87.9 fL (ref 80.0–100.0)
Platelets: 165 10*3/uL (ref 150–400)
RBC: 2.64 MIL/uL — ABNORMAL LOW (ref 4.22–5.81)
RDW: 17.2 % — ABNORMAL HIGH (ref 11.5–15.5)
WBC: 6.5 10*3/uL (ref 4.0–10.5)
nRBC: 0 % (ref 0.0–0.2)

## 2022-02-19 LAB — PREPARE RBC (CROSSMATCH)

## 2022-02-19 LAB — HEMOGLOBIN AND HEMATOCRIT, BLOOD
HCT: 27.3 % — ABNORMAL LOW (ref 39.0–52.0)
Hemoglobin: 9 g/dL — ABNORMAL LOW (ref 13.0–17.0)

## 2022-02-19 MED ORDER — SODIUM CHLORIDE 0.9% IV SOLUTION: Freq: Once | INTRAVENOUS | Status: AC

## 2022-02-19 MED ORDER — SODIUM CHLORIDE 0.9% IV SOLUTION: Freq: Once | INTRAVENOUS | Status: DC

## 2022-02-19 NOTE — Care Management Important Message (Signed)
Important Message  Patient Details  Name: Erik [name]. MRN: [id number] Date of Birth: 09-18-1940   Medicare Important Message Given:  Yes     Hobie Kohles 02/19/2022, 3:16 PM

## 2022-02-19 NOTE — Progress Notes (Signed)
Pharmacy Antibiotic Note  Erik [name]. is a 81 y.o. male admitted on 02/15/2022 with epistaxis.  Pharmacy has been consulted to continue Ancef dosing for MSSA bacteremia - end date 12/17 per OPAT order from 02/03/22.  Renal function stable. He is afebrile and WBC are normal.  Plan: Ancef 2 g IV q8h Stop date entered Pharmacy signing off but will continue to follow peripherally - please re-consult if needed   Height: '5\' 6"'$  (167.6 cm) Weight: 49.9 kg (110 lb) IBW/kg (Calculated) : 63.8  Temp (24hrs), Avg:98.3 F (36.8 C), Min:97.9 F (36.6 C), Max:98.8 F (37.1 C)  Recent Labs  Lab 02/15/22 2340 02/16/22 0659 02/16/22 0929 02/17/22 0235 02/18/22 0440 02/19/22 0428  WBC 8.7  --  7.7 8.8 8.2 6.5  CREATININE 1.27* 1.10  --  1.08 1.00  --      Estimated Creatinine Clearance: 41.6 mL/min (by C-G formula based on SCr of 1 mg/dL).    Allergies  Allergen Reactions   Codeine Anaphylaxis and Swelling    throat swells    Thank you for involving pharmacy in this patient's care.  Renold Genta, PharmD, BCPS Clinical Pharmacist Clinical phone for 02/19/2022 is [phone] 02/19/2022 10:58 AM

## 2022-02-19 NOTE — Progress Notes (Signed)
Mobility Specialist Progress Note:   02/19/22 0941  Mobility  Activity Ambulated independently in hallway  Level of Assistance Modified independent, requires aide device or extra time  Assistive Device Other (Comment) (IV Pole)  Distance Ambulated (ft) 300 ft  Activity Response Tolerated well  Mobility Referral Yes  $Mobility charge 1 Mobility   Pt received in bed and agreeable. No complaints. Pt left in bed with all needs met, call bell in reach, and wife in room.   Andrey Campanile Mobility Specialist Please contact via SecureChat or  Rehab office at [phone]

## 2022-02-19 NOTE — Progress Notes (Signed)
Consultation Progress Note   Patient: Erik [name]. GYB:[id number] DOB: 30-Jan-1941 [name]: 02/15/2022 DOS: the patient was seen and examined on 02/19/2022 Primary service: Raeden Belzer, Manfred Shirts, MD  Brief hospital course: Taken from prior notes.   Erik Repsher. is a 81 y.o. male with medical history significant for CAD, ischemic cardiomyopathy, mild COPD, and recent admission for MSSA bacteremia who now presents to the emergency department with epistaxis.  Patient has an history of prior epistaxis but they normally resolved spontaneously. Patient also vomited dark red blood.  Having some associated lightheadedness.  Denies chest pain.  On arrival to ED patient was afebrile, borderline soft blood pressure.  Hemoglobin at 6.8, sodium 130, creatinine 1.27.  Rhino Rocket was placed bilaterally, 1 L normal saline and 0.5 mg IV Dilaudid were administered, and transfusion of 2 units packed RBCs was initiated.   12/6: Hemoglobin improved to 8.6 after getting 2 unit of PRBC.  Continue to have some oozing despite packaging for more than 24 hours.  Ask ENT to reevaluate. They were recommending continuation of packing for next 2 to 3 days as patient would like to be as conservative as possible, as he has bilateral packing along with a posterior pack they were recommending staph coverage , patient with recent history of MSSA bacteremia and he will continue Ancef with end date of 02/28/2022. Patient need to stay in hospital until packages were removed by ENT. If epistaxis get worse please contact [facility] ENT.      Assessment and Plan: * Epistaxis-Likely secondary to ASA and Plavix use. Patient presented with symptomatic epistaxis causing decrease in hemoglobin and persisting for about a day.  History of prior epistaxis but they normally resolved spontaneously.  Patient was on aspirin and Plavix which were held. S/p Rhino Rocket placed bilaterally, ENT is on board -Keep holding aspirin and  Plavix -Packaging will remain in for 3 days and patient need to stay in hospital per ENT. -Removal for 12/9  Symptomatic anemia Secondary to significant epistaxis, hemoglobin at 6.8 on admission which has improved to 8.6 after getting 2 unit of PRBC - hemoglobin at 7.3, would transfuse one unit today.   MSSA bacteremia Patient with recent history of MSSA bacteremia.  Repeat blood cultures were negative.  he had negative TEE, and source was not identified during recent admission  - Continue Ancef with end date 02/28/22    Hyponatremia Most likely secondary to hypovolemia.  Some improvement of sodium to 131 after getting IV fluid. -Continue IV fluid for another day -Monitor sodium  CAD S/P percutaneous coronary angioplasty - No anginal complaints  - Antiplatelets held on admission in light of epistaxis with symptomatic anemia    COPD, mild (HCC) - Not in exacerbation on admission  - Continue as-needed albuterol         TRH will continue to follow the patient.  Subjective: No complaints overnight. This morning complains of having a sensation in his throat. RN checked vitals and this has been stable. He is anxious about removal of his Rhinorockets.  Physical Exam: Vitals:   02/18/22 1535 02/18/22 1925 02/19/22 0212 02/19/22 0700  BP: (!) 138/57 (!) 128/57 (!) 148/52 (!) 153/56  Pulse: 68 65 70 74  Resp: '18 18 18 16  '$ Temp: 97.9 F (36.6 C) 98.2 F (36.8 C) 98.2 F (36.8 C) 98.8 F (37.1 C)  TempSrc: Oral Oral  Oral  SpO2: 96% 96% 98% 97%  Weight:      Height:  General.  Frail gentleman, in no acute distress.  Bilateral Rhino Rocket's in place, No obvious lesion on the roof of mouth Pulmonary.  Lungs clear bilaterally, normal respiratory effort. CV.  Regular rate and rhythm, no JVD, rub or murmur. Abdomen.  Soft, nontender, nondistended, BS positive. CNS.  Alert and oriented .  No focal neurologic deficit. Extremities.  No edema, no cyanosis, pulses intact and  symmetrical. Psychiatry.  Judgment and insight appears normal.  Data Reviewed:  There are no new results to review at this time.  Family Communication: Wife  Time spent: 15 minutes.  Author: Cristela Felt, MD 02/19/2022 11:21 AM  For on call review [email or web address].

## 2022-02-19 NOTE — Progress Notes (Signed)
Patient reporting of having trouble breathing. Vitals taken and patient saturating at 97% on RA, RR 16. Patient states he has something stuck in his throat and unable to cough up. Dr. Williams Che made aware. Per Dr. Williams Che to monitor patient for now

## 2022-02-20 DIAGNOSIS — R040 Epistaxis: Secondary | ICD-10-CM | POA: Diagnosis not present

## 2022-02-20 LAB — TYPE AND SCREEN
ABO/RH(D): A POS
Antibody Screen: NEGATIVE
Unit division: 0

## 2022-02-20 LAB — BPAM RBC
Blood Product Expiration Date: 202312242359
ISSUE DATE / TIME: 202312081252
Unit Type and Rh: 6200

## 2022-02-20 MED ORDER — SALINE SPRAY 0.65 % NA SOLN: 2.0000 | NASAL | 2 refills | Status: DC | PRN

## 2022-02-20 MED ORDER — OXYMETAZOLINE HCL 0.05 % NA SOLN: 2.0000 | Freq: Two times a day (BID) | NASAL | 2 refills | Status: DC | PRN

## 2022-02-20 MED ORDER — CEFAZOLIN IV (FOR PTA / DISCHARGE USE ONLY): 2.0000 g | Freq: Three times a day (TID) | INTRAVENOUS | 0 refills | Status: AC

## 2022-02-20 MED ORDER — SALINE SPRAY 0.65 % NA SOLN
2.0000 | NASAL | Status: DC | PRN
  Filled 2022-02-20: qty 44

## 2022-02-20 NOTE — Consult Note (Signed)
Reason for Consult: Epistaxis follow-up Referring Physician: Lorella Nimrod, MD  Erik [name]. is an 81 y.o. male.  HPI: Anticoagulated, history of epistaxis that initiated on the left side.  He was packed bilaterally in the emergency department late Monday night.  He has had minimal oozing since then.  Past Medical History:  Diagnosis Date   Adenomatous colon polyp    Aortic aneurysm (HCC)    BPH (benign prostatic hypertrophy)    CAD (coronary artery disease)    Carotid artery stenosis    a. Bilateral CEA   Cataract    CHF (congestive heart failure) (HCC)    Chronic systolic heart failure (HCC)    a. EF 20-25%, mild LVH, mod HK, mid apicalanteroseptal myocardium, mild MR, LA mod dilated   Collagen vascular disease (HCC)    COPD (chronic obstructive pulmonary disease) ([place])    Coronary artery disease    a. LHC (08/2013): Lmain: short 30% distal, LAD: sml D1 & D2, 70% ostial D1, 95-99% LAD stenosis prox D2 LCx: sml/mod ramus subtot. occluded, 40% ostial set off lg OM1, 40% AV LCx after OM1, RCA: 90% prox (DES to RCA and prox LAD)   Diverticulosis    GERD (gastroesophageal reflux disease)    Heart murmur    History of colonic polyps    Hyperlipidemia    Hyperplastic colon polyp    Hypertension    Ischemic cardiomyopathy    RLS (restless legs syndrome)     Past Surgical History:  Procedure Laterality Date   BUBBLE STUDY  02/03/2022   Procedure: BUBBLE STUDY;  Surgeon: Freada Bergeron, MD;  Location: [facility];  Service: Cardiovascular;;   cardiac stents  09-2013   CAROTID ENDARTERECTOMY  04/17/2008   right   CAROTID ENDARTERECTOMY  05/30/08   Left   CATARACT EXTRACTION W/PHACO Right 10/05/2018   Procedure: CATARACT EXTRACTION PHACO AND INTRAOCULAR LENS PLACEMENT ([place]), RIGHT;  Surgeon: Birder Robson, MD;  Location: [facility] ORS;  Service: Ophthalmology;  Laterality: Right;  [place] 01:06.4 cde 12.79 Fluid Pack Lot # [id number]   CATARACT EXTRACTION W/PHACO Left  11/02/2018   Procedure: CATARACT EXTRACTION PHACO AND INTRAOCULAR LENS PLACEMENT ([place]), LEFT;  Surgeon: Birder Robson, MD;  Location: [facility] ORS;  Service: Ophthalmology;  Laterality: Left;  [place]  01:10 CDE 11.52 Fluid pack lot # [id number]   CHOLECYSTECTOMY     Gall Bladder   CORONARY ANGIOGRAM  09/11/2013   Procedure: CORONARY ANGIOGRAM;  Surgeon: Troy Sine, MD;  Location: [facility] CATH LAB;  Service: Cardiovascular;;   CORONARY ANGIOPLASTY     STENTS   HERNIA REPAIR     lower aorta aneurysm  05/26/2015   [facility]   PERCUTANEOUS STENT INTERVENTION  09/11/2013   Procedure: PERCUTANEOUS STENT INTERVENTION;  Surgeon: Troy Sine, MD;  Location: [facility] CATH LAB;  Service: Cardiovascular;;  DES Prox RCA 3.5x15 xience    TEE WITHOUT CARDIOVERSION N/A 02/03/2022   Procedure: TRANSESOPHAGEAL ECHOCARDIOGRAM (TEE);  Surgeon: Freada Bergeron, MD;  Location: [facility] ENDOSCOPY;  Service: Cardiovascular;  Laterality: N/A;    Family History  Problem Relation Age of Onset   Heart disease Brother    Hyperlipidemia Brother    Pancreatic cancer Brother    Hypertension Mother    Cancer Father        unknown type; sounds GI   Emphysema Father    Hyperlipidemia Brother    Hyperlipidemia Brother    Heart attack Brother    Hyperlipidemia Brother    Multiple sclerosis Brother  Kidney disease Neg Hx    Prostate cancer Neg Hx    Colon cancer Neg Hx    Esophageal cancer Neg Hx    Rectal cancer Neg Hx    Stomach cancer Neg Hx     Social History:  reports that he has been smoking cigarettes. He has a 50.00 pack-year smoking history. He uses smokeless tobacco. He reports that he does not drink alcohol and does not use drugs.  Allergies:  Allergies  Allergen Reactions   Codeine Anaphylaxis and Swelling    throat swells    Medications: Reviewed  Results for orders placed or performed during the hospital encounter of 02/15/22 (from the past 48 hour(s))  Hemoglobin and hematocrit, blood     Status:  Abnormal   Collection Time: 02/18/22  7:09 PM  Result Value Ref Range   Hemoglobin 7.3 (L) 13.0 - 17.0 g/dL   HCT 23.4 (L) 39.0 - 52.0 %    Comment: Performed at [facility], [address] [place], [place] [address]  CBC     Status: Abnormal   Collection Time: 02/19/22  4:28 AM  Result Value Ref Range   WBC 6.5 4.0 - 10.5 K/uL   RBC 2.64 (L) 4.22 - 5.81 MIL/uL   Hemoglobin 7.3 (L) 13.0 - 17.0 g/dL   HCT 23.2 (L) 39.0 - 52.0 %   MCV 87.9 80.0 - 100.0 fL   MCH 27.7 26.0 - 34.0 pg   MCHC 31.5 30.0 - 36.0 g/dL   RDW 17.2 (H) 11.5 - 15.5 %   Platelets 165 150 - 400 K/uL   nRBC 0.0 0.0 - 0.2 %    Comment: Performed at [facility], [place] [address] [place], [place] [address]  Type and screen [place]     Status: None (Preliminary result)   Collection Time: 02/19/22 11:25 AM  Result Value Ref Range   ABO/RH(D) A POS    Antibody Screen NEG    Sample Expiration 02/22/2022,2359    Unit Number [id number]    Blood Component Type RED CELLS,LR    Unit division 00    Status of Unit ISSUED    Transfusion Status OK TO TRANSFUSE    Crossmatch Result      Compatible Performed at [facility], [place] [address] [place], [place] [address]   Prepare RBC (crossmatch)     Status: None   Collection Time: 02/19/22 11:26 AM  Result Value Ref Range   Order Confirmation      ORDER PROCESSED BY BLOOD BANK Performed at [facility], [place] [address] [place], [place] [address]   Hemoglobin and hematocrit, blood     Status: Abnormal   Collection Time: 02/19/22  8:22 PM  Result Value Ref Range   Hemoglobin 9.0 (L) 13.0 - 17.0 g/dL   HCT 27.3 (L) 39.0 - 52.0 %    Comment: Performed at [facility] [address] [place], [place] [address]    No results found.  TFT:[id number] except as listed in admit H&P  Blood pressure (!) 159/59, pulse 65, temperature 98.2 F (36.8 C), temperature source Oral, resp. rate 17, height '5\' 6"'$  (1.676 m), weight 49.9  kg, SpO2 99 %.  PHYSICAL EXAM: Overall appearance: Elderly gentleman, in no distress Head:  Normocephalic, atraumatic. Ears: External ears look normal. Nose: External nose is healthy in appearance.  Bilateral inflatable packing in place. Oral Cavity/Pharynx:  There are no mucosal lesions or masses  identified.  No bloody drainage down the oropharynx. Larynx/Hypopharynx: Deferred Neuro:  No identifiable neurologic deficits. Neck: No palpable neck masses.  Studies Reviewed: none  Procedures: Both of the nasal packs were deflated and removed.  The nasal cavities were carefully suctioned of all secretions.  There is no active bleeding.  Topical Afrin/Xylocaine was applied and spray form.  Careful inspection of the nasal cavity revealed a large septal perforation with some crusting along the posteroinferior edge.  There is no active granulation tissue no signs of neoplasm.   Assessment/Plan: Large nasal septal perforation causing epistaxis in conjunction with anticoagulation.  He is at risk for future bleeds.  Because of the perforation is not known.  No history of nasal trauma, nasal surgery or cocaine use.  Recommend frequent use of nasal saline spray every hour while awake.  He will need to do this for the rest of his life.  He can use Afrin spray to stop minor bleeding and will need to contact us for follow-up or come to the hospital if he has any heavy bleeding.   Z79.01 R04.0 J34.89  Medical Decision Making: #/Complex Problems: 3  Data Reviewed:1  Management:3 (1-Straightforward, 2-Low, 3-Moderate, 4-High)   Izora Gala 02/20/2022, 9:01 AM

## 2022-02-20 NOTE — Progress Notes (Signed)
Patient being discharged to home. Patient educated on discharge instructions and discharge medications including follow up appts. Ivx1 taken out. Patient came into hospital with PICC for outpatient infusion of abx. Patient left with PICC in place, CDI. All questions answered. Patient states he understands education provided to him. Patient has wife at bedside to take him home.

## 2022-02-20 NOTE — Discharge Instructions (Signed)
Spray nasal saline spray in both sides of the nose 2 sprays every hour while awake.  If there is any bleeding spray Afrin nasal spray in the nose repeat 3 times and if there is still continued bleeding contact our office or go to the emergency department.

## 2022-02-20 NOTE — Discharge Summary (Signed)
Physician Discharge Summary   Patient: Erik [name]. MRN: [id number] DOB: 07/19/1940  Admit date:     02/15/2022  Discharge date: 02/20/22  Discharge Physician: Lorella Nimrod   PCP: [name], MD   Recommendations at discharge:  Please obtain CBC and BMP in 1 week Follow-up with primary care provider Follow-up with PCP Follow-up with cardiology to discuss the need of DAPT  Discharge Diagnoses: Principal Problem:   Epistaxis Active Problems:   Symptomatic anemia   MSSA bacteremia   Hyponatremia   CAD S/P percutaneous coronary angioplasty   COPD, mild [facility])   Hospital Course: Taken from prior notes.   Erik [name]. is a 81 y.o. male with medical history significant for CAD, ischemic cardiomyopathy, mild COPD, and recent admission for MSSA bacteremia who now presents to the emergency department with epistaxis.  Patient has an history of prior epistaxis but they normally resolved spontaneously. Patient also vomited dark red blood.  Having some associated lightheadedness.  Denies chest pain.  On arrival to ED patient was afebrile, borderline soft blood pressure.  Hemoglobin at 6.8, sodium 130, creatinine 1.27.  Rhino Rocket was placed bilaterally, 1 L normal saline and 0.5 mg IV Dilaudid were administered, and transfusion of 2 units packed RBCs was initiated.   12/6: Hemoglobin improved to 8.6 after getting 2 unit of PRBC.  Continue to have some oozing despite packaging for more than 24 hours.  Ask ENT to reevaluate. They were recommending continuation of packing for next 2 to 3 days as patient would like to be as conservative as possible, as he has bilateral packing along with a posterior pack they were recommending staph coverage , patient with recent history of MSSA bacteremia and he will continue Ancef with end date of 02/28/2022.  12/9: Patient remained stable but did need another unit of blood when hemoglobin dropped to 7.3 after improving to 8.6.  Total of 3  units was given and hemoglobin on the day of discharge was 9.  Discussed with the ENT again and they remove the packing with no active bleeding.  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  He was advised to use saline nasal spray every hour while awake which he need to do for rest of his life per ENT note.  He can use Afrin nasal spray for any bleeding. He was advised to come to the hospital if unable to control bleeding with above-mentioned measures.  Patient can restart his home aspirin and Plavix from tomorrow.  Need to discuss with his cardiologist the need to continue on both or if he can stop 1 of that medicine to decrease his risk of bleeding.  He will continue his antibiotics as planned before, his end date is 02/28/2022.  Patient will continue with rest of his home medications and need to have a close follow-up with his providers for further recommendations.      Assessment and Plan: * Epistaxis Patient presented with symptomatic epistaxis causing decrease in hemoglobin and persisting for about a day.  History of prior epistaxis but they normally resolved spontaneously.  Patient was on aspirin and Plavix which were held. S/p Rhino Rocket placed bilaterally, ENT is on board -Keep holding aspirin and Plavix -Packaging will remain in for 2 to 3 days and patient need to stay in hospital per ENT.  Symptomatic anemia Secondary to significant epistaxis, hemoglobin at 6.8 on admission which has improved  to 8.6 after getting 2 unit of PRBC -Monitor hemoglobin -Transfuse if below 7  MSSA bacteremia Patient with recent history of MSSA bacteremia.  Repeat blood cultures were negative.  he had negative TEE, and source was not identified during recent admission  - Continue Ancef with end date 02/28/22    Hyponatremia Most likely secondary to hypovolemia.  Some improvement of sodium to 131 after getting IV fluid. -Continue  IV fluid for another day -Monitor sodium  CAD S/P percutaneous coronary angioplasty - No anginal complaints  - Antiplatelets held on admission in light of epistaxis with symptomatic anemia    COPD, mild (HCC) - Not in exacerbation on admission  - Continue as-needed albuterol    Consultants: ENT Procedures performed: Bilateral nasal packing. Disposition: Home Diet recommendation:  Discharge Diet Orders (From admission, onward)     Start     Ordered   02/20/22 0000  Diet - low sodium heart healthy        02/20/22 0945           Cardiac diet DISCHARGE MEDICATION: Allergies as of 02/20/2022       Reactions   Codeine Anaphylaxis, Swelling   throat swells        Medication List     TAKE these medications    acetaminophen 500 MG tablet Commonly known as: TYLENOL Take 1,000 mg by mouth at bedtime.   aspirin EC 81 MG tablet Take 81 mg by mouth every evening.   ceFAZolin  IVPB Commonly known as: ANCEF Inject 2 g into the vein every 8 (eight) hours for 25 days. Indication:  MSSA Bacteremia First Dose: Yes Last Day of Therapy:  02/28/22  Labs - Once weekly:  CBC/D and BMP, Labs - Every other week:  ESR and CRP Method of administration: IV Push Method of administration may be changed at the discretion of home infusion pharmacist based upon assessment of the patient and/or caregiver's ability to self-administer the medication ordered.   clopidogrel 75 MG tablet Commonly known as: PLAVIX TAKE 1 TABLET BY MOUTH EVERY DAY   cyanocobalamin 1000 MCG tablet Commonly known as: VITAMIN B12 Take 1,000 mcg by mouth daily.   dapagliflozin propanediol 10 MG Tabs tablet Commonly known as: Farxiga Take 1 tablet (10 mg total) by mouth daily before breakfast.   Entresto 24-26 MG Generic drug: sacubitril-valsartan Take 1 tablet by mouth 2 (two) times daily.   ezetimibe-simvastatin 10-40 MG tablet Commonly known as: VYTORIN TAKE 1 TABLET BY MOUTH EVERYDAY AT BEDTIME What  changed: See the new instructions.   famotidine 20 MG tablet Commonly known as: PEPCID Take 1 tablet (20 mg total) by mouth at bedtime. What changed: when to take this   ferrous sulfate 325 (65 FE) MG tablet Take 325 mg by mouth daily with breakfast.   Fish Oil 1000 MG Caps Take 1 capsule by mouth daily.   furosemide 20 MG tablet Commonly known as: LASIX Take 1 tablet (20 mg total) by mouth daily.   methocarbamol 500 MG tablet Commonly known as: ROBAXIN Take 1 tablet (500 mg total) by mouth at bedtime as needed for muscle spasms. What changed: when to take this   metoprolol succinate 50 MG 24 hr tablet Commonly known as: TOPROL-XL TAKE 1 TABLET BY MOUTH EVERY DAY WITH OR IMMEDIATELY FOLLOWING MEAL (NEEDS OFFICE VISIT) What changed: See the new instructions.   mirtazapine 15 MG tablet Commonly known as: REMERON Take 15 mg by mouth at bedtime.   nitroGLYCERIN 0.4 MG SL  tablet Commonly known as: NITROSTAT Place 1 tablet (0.4 mg total) under the tongue every 5 (five) minutes x 3 doses as needed for chest pain.   oxymetazoline 0.05 % nasal spray Commonly known as: AFRIN Place 2 sprays into both nostrils 2 (two) times daily as needed (For nose bleeding).   rOPINIRole 3 MG tablet Commonly known as: REQUIP TAKE 1/2 TABLET BY MOUTH IN THE MORNING & TAKE 1 TABLET AT NIGHT What changed:  how much to take how to take this when to take this additional instructions   sodium chloride 0.65 % Soln nasal spray Commonly known as: OCEAN Place 2 sprays into both nostrils every hour as needed (dryness).   spironolactone 25 MG tablet Commonly known as: ALDACTONE TAKE 1/2 TABLET BY MOUTH EVERY DAY   tamsulosin 0.4 MG Caps capsule Commonly known as: FLOMAX TAKE 1 CAPSULE BY MOUTH EVERY DAY        Follow-up Information     [facility] Follow up.   Why: [facility] will be providing IV antibiotics for home.        [place], [place] Follow up.   Contact  information: [place] [address] [phone]         Izora Gala, MD Follow up.   Specialty: Otolaryngology Why: As needed Contact information: [address] [place] [address] [phone]         [name], MD. Schedule an appointment as soon as possible for a visit in 1 week(s).   Specialty: Family Medicine Contact information: [place] [address] [phone]                Discharge Exam: Erik [name] Weights   02/15/22 2326  Weight: 49.9 kg   General.  Frail elderly man, in no acute distress. Pulmonary.  Lungs clear bilaterally, normal respiratory effort. CV.  Regular rate and rhythm, no JVD, rub or murmur. Abdomen.  Soft, nontender, nondistended, BS positive. CNS.  Alert and oriented .  No focal neurologic deficit. Extremities.  No edema, no cyanosis, pulses intact and symmetrical. Psychiatry.  Judgment and insight appears normal.   Condition at discharge: stable  The results of significant diagnostics from this hospitalization (including imaging, microbiology, ancillary and laboratory) are listed below for reference.   Imaging Studies: DG Chest 2 View  Result Date: 02/05/2022 CLINICAL DATA:  Shortness of breath with testicular and penile swelling. EXAM: CHEST - 2 VIEW COMPARISON:  January 29, 2022 FINDINGS: A right-sided PICC line is noted with its distal tip seen within the distal aspect of the superior vena cava. The heart size and mediastinal contours are within normal limits. A radiopaque aortic stent is seen along the junction of the descending thoracic aorta and abdominal aorta. Mild diffusely increased interstitial lung markings are seen. Small bilateral pleural effusions are noted, right greater than left. No pneumothorax is identified. The visualized skeletal structures are unremarkable. IMPRESSION: Mild interstitial edema with small bilateral pleural effusions, right greater  than left. Electronically Signed   By: Virgina Norfolk M.D.   On: 02/05/2022 22:29   [place] EKG SITE RITE  Result Date: 02/03/2022 If Site Rite image not attached, placement could not be confirmed due to current cardiac rhythm.  ECHO TEE  Result Date: 02/03/2022    TRANSESOPHOGEAL ECHO REPORT   Patient Name:   Erik [name]. Date of Exam: 02/03/2022 Medical Rec #:  [id number]  Height:       66.0 in Accession #:    [id number]            Weight:       125.0 lb Date of Birth:  01-31-1941              BSA:          1.638 m Patient Age:    26 years              BP:           130/55 mmHg Patient Gender: M                     HR:           70 bpm. Exam Location:  Inpatient Procedure: Transesophageal Echo, Color Doppler, Cardiac Doppler and 3D Echo Indications:     Bacteremia  History:         Patient has prior history of Echocardiogram examinations, most                  recent 01/30/2022. Cardiomyopathy, CAD, COPD,                  Signs/Symptoms:Murmur; Risk Factors:Dyslipidemia, Current                  Smoker and Hypertension.  Sonographer:     Johny Chess RDCS Referring Phys:  [id number] Lily Kocher Diagnosing Phys: Gwyndolyn Kaufman MD PROCEDURE: After discussion of the risks and benefits of a TEE, an informed consent was obtained from the patient. The transesophogeal probe was passed without difficulty through the esophogus of the patient. Imaged were obtained with the patient in a left lateral decubitus position. Sedation performed by different physician. The patient was monitored while under deep sedation. Anesthestetic sedation was provided intravenously by Anesthesiology: 181m of Propofol. The patient developed no complications during the procedure.  IMPRESSIONS  1. No valvular vegetations visualized.  2. Left ventricular ejection fraction, by estimation, is 50%. The left ventricle has low normal function.  3. Right ventricular systolic function is normal. The right ventricular size  is normal. There is moderately elevated pulmonary artery systolic pressure. The estimated right ventricular systolic pressure is 422.0mmHg.  4. Left atrial size was moderately dilated. No left atrial/left atrial appendage thrombus was detected.  5. Right atrial size was mildly dilated.  6. The mitral valve is degenerative. Mild to moderate mitral valve regurgitation with EROA 0.11cm2, RVol 268m Mild to moderate mitral stenosis with average mean gradient 6.64m66m at HR 62bpm. Severe mitral annular calcification most notably on the posterior MV annulus.  7. Tricuspid valve regurgitation is mild to moderate.  8. The aortic valve is tricuspid. There is mild calcification of the aortic valve. There is mild thickening of the aortic valve. Aortic valve regurgitation is mild. Aortic valve sclerosis/calcification is present, without any evidence of aortic stenosis.  9. There is Severe (Grade IV) plaque. 10. Evidence of atrial level shunting detected by color flow Doppler. 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 Agitated saline bubble study was positive with valsalva. FINDINGS  Left Ventricle: Left ventricular ejection fraction, by estimation, is 50%. The left ventricle has low normal function. The left ventricular internal cavity size was normal in size. Right Ventricle: The right ventricular size is normal. No increase in right ventricular wall thickness. Right ventricular systolic function is normal. There is moderately elevated pulmonary artery  systolic pressure. The tricuspid regurgitant velocity is 3.34 m/s, and with an assumed right atrial pressure of 3 mmHg, the estimated right ventricular systolic pressure is 71.6 mmHg. Left Atrium: Left atrial size was moderately dilated. No left atrial/left atrial appendage thrombus was detected. Right Atrium: Right atrial size was mildly dilated. Pericardium: There is no evidence of pericardial effusion. Mitral Valve:  EROA 0.11cm2, RVol 54m. The mitral valve is degenerative in appearance. There is mild thickening of the mitral valve leaflet(s). There is mild calcification of the mitral valve leaflet(s). Severe mitral annular calcification. Mild to moderate mitral valve regurgitation. Mild to moderate mitral valve stenosis. MV peak gradient, 11.6 mmHg. The mean mitral valve gradient is 6.5 mmHg. There is no evidence of mitral valve vegetation. Tricuspid Valve: The tricuspid valve is normal in structure. Tricuspid valve regurgitation is mild to moderate. There is no evidence of tricuspid valve vegetation. Aortic Valve: The aortic valve is tricuspid. There is mild calcification of the aortic valve. There is mild thickening of the aortic valve. Aortic valve regurgitation is mild. Aortic valve sclerosis/calcification is present, without any evidence of aortic stenosis. There is no evidence of aortic valve vegetation. Pulmonic Valve: The pulmonic valve was normal in structure. Pulmonic valve regurgitation is trivial. There is no evidence of pulmonic valve vegetation. Aorta: The aortic root is normal in size and structure. There is severe (Grade IV) plaque. IAS/Shunts: Evidence of atrial level shunting detected by color flow Doppler. Agitated saline contrast was given intravenously to evaluate for intracardiac shunting.  MITRAL VALVE                  TRICUSPID VALVE MV Peak grad: 11.6 mmHg       TR Peak grad:   44.6 mmHg MV Mean grad: 6.5 mmHg        TR Vmax:        334.00 cm/s MV Vmax:      1.70 m/s MV Vmean:     119.5 cm/s MR Peak grad:    141.6 mmHg MR Mean grad:    81.0 mmHg MR Vmax:         595.00 cm/s MR Vmean:        419.0 cm/s MR PISA:         1.57 cm MR PISA Eff ROA: 11 mm MR PISA Radius:  0.50 cm HGwyndolyn KaufmanMD Electronically signed by HGwyndolyn KaufmanMD Signature Date/Time: 02/03/2022/9:25:01 AM    Final    [place]dom[place] Limited RUQ (LIVER/GB)  Result Date: 02/02/2022 CLINICAL DATA:  Transaminitis.  History of  cholecystectomy. EXAM: ULTRASOUND ABDOMEN LIMITED RIGHT UPPER QUADRANT COMPARISON:  Abdominopelvic CT 01/29/2022. FINDINGS: Gallbladder: Status post cholecystectomy. Possible small amount of fluid within the cholecystectomy bed. Cholecystectomy does not appear to have been performed recently. Common bile duct: Diameter: 5 mm.  No intrahepatic biliary dilatation. Liver: Mildly increased hepatic echogenicity, most typically due to steatosis. No focal abnormality. Portal vein is patent on color Doppler imaging with normal direction of blood flow towards the liver. Other: No generalized ascites.  Aortic stent graft noted. IMPRESSION: 1. No biliary dilatation status post cholecystectomy. Possible small amount of fluid within the cholecystectomy bed, nonspecific. 2. Hepatic steatosis. Electronically Signed   By: WRichardean SaleM.D.   On: 02/02/2022 12:10   DG Abd 1 View  Result Date: 02/01/2022 CLINICAL DATA:  [id number] pain [id number]: ABDOMEN - 1 VIEW COMPARISON:  X-ray abdomen 01/30/2022, CT abdomen pelvis 01/29/2022 FINDINGS: Interval development of gaseous dilatation of the  colon. No gaseous dilatation of the small bowel. No radio-opaque calculi or other significant radiographic abnormality are seen. Aorta to bilateral iliac repair noted. IMPRESSION: Interval development of gaseous dilatation of the colon. Consider CT with intravenous contrast for further evaluation. Electronically Signed   By: Iven Finn M.D.   On: 02/01/2022 17:44   ECHOCARDIOGRAM COMPLETE  Result Date: 01/30/2022    ECHOCARDIOGRAM REPORT   Patient Name:   Erik [name]. Date of Exam: 01/30/2022 Medical Rec #:  [id number]             Height:       66.0 in Accession #:    [id number]            Weight:       121.3 lb Date of Birth:  1940/11/04              BSA:          1.617 m Patient Age:    62 years              BP:           104/57 mmHg Patient Gender: M                     HR:           84 bpm. Exam Location:   Inpatient Procedure: 2D Echo Indications:    cardiomyopathy-ischemic  History:        Patient has prior history of Echocardiogram examinations, most                 recent 07/21/2020. Cardiomyopathy, CAD, COPD,                 Signs/Symptoms:Murmur; Risk Factors:Dyslipidemia.  Sonographer:    Harvie Junior Referring Phys: [phone] Silvestre Moment [facility]  Sonographer Comments: Technically difficult study due to poor echo windows. IMPRESSIONS  1. Left ventricular ejection fraction, by estimation, is 50 to 55%. The left ventricle has low normal function. The left ventricle has no regional wall motion abnormalities. There is mild left ventricular hypertrophy. Left ventricular diastolic function  could not be evaluated.  2. Right ventricular systolic function is normal. The right ventricular size is normal. There is moderately elevated pulmonary artery systolic pressure.  3. Left atrial size was moderately dilated.  4. The mitral valve is normal in structure. Mild to moderate mitral valve regurgitation. Mild mitral stenosis. Severe mitral annular calcification.  5. The aortic valve has an indeterminant number of cusps. Aortic valve regurgitation is mild. Aortic valve sclerosis/calcification is present, without any evidence of aortic stenosis.  6. The inferior vena cava is dilated in size with <50% respiratory variability, suggesting right atrial pressure of 15 mmHg. FINDINGS  Left Ventricle: Left ventricular ejection fraction, by estimation, is 50 to 55%. The left ventricle has low normal function. The left ventricle has no regional wall motion abnormalities. The left ventricular internal cavity size was normal in size. There is mild left ventricular hypertrophy. Left ventricular diastolic function could not be evaluated due to mitral annular calcification (moderate or greater). Left ventricular diastolic function could not be evaluated. Right Ventricle: The right ventricular size is normal. Right ventricular systolic function is  normal. There is moderately elevated pulmonary artery systolic pressure. The tricuspid regurgitant velocity is 2.76 m/s, and with an assumed right atrial pressure of 15 mmHg, the estimated right ventricular systolic pressure is 79.0 mmHg. Left Atrium: Left atrial size was moderately dilated. Right Atrium:  Right atrial size was normal in size. Pericardium: There is no evidence of pericardial effusion. Mitral Valve: The mitral valve is normal in structure. Severe mitral annular calcification. Mild to moderate mitral valve regurgitation. Mild mitral valve stenosis. MV peak gradient, 15.5 mmHg. The mean mitral valve gradient is 5.3 mmHg. Tricuspid Valve: The tricuspid valve is normal in structure. Tricuspid valve regurgitation is trivial. No evidence of tricuspid stenosis. Aortic Valve: The aortic valve has an indeterminant number of cusps. Aortic valve regurgitation is mild. Aortic regurgitation PHT measures 342 msec. Aortic valve sclerosis/calcification is present, without any evidence of aortic stenosis. Aortic valve mean gradient measures 7.0 mmHg. Aortic valve peak gradient measures 14.3 mmHg. Aortic valve area, by VTI measures 1.97 cm. Pulmonic Valve: The pulmonic valve was not well visualized. Pulmonic valve regurgitation is not visualized. No evidence of pulmonic stenosis. Aorta: The aortic root is normal in size and structure. Venous: The inferior vena cava is dilated in size with less than 50% respiratory variability, suggesting right atrial pressure of 15 mmHg. IAS/Shunts: No atrial level shunt detected by color flow Doppler.  LEFT VENTRICLE PLAX 2D LVIDd:         4.50 cm      Diastology LVIDs:         3.30 cm      LV e' medial:    6.20 cm/s LV PW:         0.90 cm      LV E/e' medial:  25.0 LV IVS:        0.70 cm      LV e' lateral:   4.68 cm/s LVOT diam:     1.90 cm      LV E/e' lateral: 33.1 LV SV:         62 LV SV Index:   38 LVOT Area:     2.84 cm  LV Volumes (MOD) LV vol d, MOD A2C: 107.0 ml LV vol d,  MOD A4C: 123.0 ml LV vol s, MOD A2C: 57.4 ml LV vol s, MOD A4C: 58.5 ml LV SV MOD A2C:     49.6 ml LV SV MOD A4C:     123.0 ml LV SV MOD BP:      59.3 ml RIGHT VENTRICLE RV Basal diam:  4.10 cm RV Mid diam:    3.30 cm RV S prime:     14.00 cm/s TAPSE (M-mode): 2.5 cm LEFT ATRIUM             Index        RIGHT ATRIUM           Index LA diam:        3.40 cm 2.10 cm/m   RA Area:     16.50 cm LA Vol (A2C):   69.3 ml 42.87 ml/m  RA Volume:   47.40 ml  29.32 ml/m LA Vol (A4C):   71.5 ml 44.23 ml/m LA Biplane Vol: 71.0 ml 43.92 ml/m  AORTIC VALVE                     PULMONIC VALVE AV Area (Vmax):    1.83 cm      PV Vmax:       1.38 m/s AV Area (Vmean):   1.78 cm      PV Peak grad:  7.6 mmHg AV Area (VTI):     1.97 cm AV Vmax:           189.00 cm/s AV Vmean:  119.000 cm/s AV VTI:            0.312 m AV Peak Grad:      14.3 mmHg AV Mean Grad:      7.0 mmHg LVOT Vmax:         122.00 cm/s LVOT Vmean:        74.700 cm/s LVOT VTI:          0.217 m LVOT/AV VTI ratio: 0.70 AI PHT:            342 msec  AORTA Ao Root diam: 3.40 cm MITRAL VALVE                TRICUSPID VALVE MV Area (PHT): 4.52 cm     TR Peak grad:   30.5 mmHg MV Area VTI:   1.23 cm     TR Vmax:        276.00 cm/s MV Peak grad:  15.5 mmHg MV Mean grad:  5.3 mmHg     SHUNTS MV Vmax:       1.97 m/s     Systemic VTI:  0.22 m MV Vmean:      106.3 cm/s   Systemic Diam: 1.90 cm MV Decel Time: 168 msec MR Peak grad: 113.6 mmHg MR Vmax:      533.00 cm/s MV E velocity: 155.00 cm/s MV A velocity: 165.00 cm/s MV E/A ratio:  0.94 Erik Ruths MD Electronically signed by Erik Ruths MD Signature Date/Time: 01/30/2022/2:43:38 PM    Final    DG Abd Portable 1V  Result Date: 01/30/2022 CLINICAL DATA:  Small bowel obstruction. EXAM: PORTABLE ABDOMEN - 1 VIEW COMPARISON:  01/29/2022 CT and prior studies FINDINGS: A mildly distended loop of small bowel within the central abdomen is noted. No other gas-filled dilated bowel loops are present. Gas in the colon  is present. Contrast in the bladder from recent CT noted. Aorta bi-iliac stent again identified. IMPRESSION: Mildly distended loop of small bowel within the central abdomen. No other dilated bowel loops identified on this examination. Electronically Signed   By: Margarette Canada M.D.   On: 01/30/2022 09:36   CT Abdomen Pelvis W Contrast  Result Date: 01/29/2022 CLINICAL DATA:  Sepsis, abdominal pain EXAM: CT ABDOMEN AND PELVIS WITH CONTRAST TECHNIQUE: Multidetector CT imaging of the abdomen and pelvis was performed using the standard protocol following bolus administration of intravenous contrast. RADIATION DOSE REDUCTION: This exam was performed according to the departmental dose-optimization program which includes automated exposure control, adjustment of the mA and/or kV according to patient size and/or use of iterative reconstruction technique. CONTRAST:  25m OMNIPAQUE IOHEXOL 350 MG/ML SOLN COMPARISON:  10/29/2021 FINDINGS: Lower chest: No acute abnormality. Coronary artery and aortic calcifications. Hepatobiliary: No focal hepatic abnormality. Gallbladder unremarkable. Pancreas: No focal abnormality or ductal dilatation. Spleen: No focal abnormality.  Normal size. Adrenals/Urinary Tract: No adrenal abnormality. No focal renal abnormality. No stones or hydronephrosis. Urinary bladder is unremarkable. Stomach/Bowel: Dilated small bowel loops noted in the abdomen and continuing into the pelvis. Distal small bowel loops are decompressed. Cannot exclude mild distal small bowel obstruction. 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 Maximum aneurysm sac size 4.5 cm compared to 4.6 cm previously. No adenopathy Reproductive: Prostate enlargement. Other: No free fluid or free air. Musculoskeletal: No acute bony abnormality. IMPRESSION: Mildly dilated and fluid-filled small bowel loops continue to the pelvis. Distal small bowel is decompressed. Cannot exclude mild distal small bowel  obstruction. Electronically Signed   By: KRolm Baptise  M.D.   On: 01/29/2022 22:41   DG Chest Port 1 View  Result Date: 01/29/2022 CLINICAL DATA:  Fever, shortness of breath EXAM: PORTABLE CHEST 1 VIEW COMPARISON:  CT chest dated 06/16/2021 FINDINGS: Lungs are clear.  No pleural effusion or pneumothorax. The heart is normal in size. Aortic stent at the aortic hiatus. IMPRESSION: No evidence of acute cardiopulmonary disease. Electronically Signed   By: Julian Hy M.D.   On: 01/29/2022 20:48    Microbiology: Results for orders placed or performed during the hospital encounter of 01/29/22  Blood Culture (routine x 2)     Status: Abnormal   Collection Time: 01/29/22  8:11 PM   Specimen: BLOOD LEFT FOREARM  Result Value Ref Range Status   Specimen Description BLOOD LEFT FOREARM  Final   Special Requests   Final    BOTTLES DRAWN AEROBIC AND ANAEROBIC Blood Culture results may not be optimal due to an excessive volume of blood received in culture bottles   Culture  Setup Time   Final    GRAM POSITIVE COCCI IN BOTH AEROBIC AND ANAEROBIC BOTTLES CRITICAL RESULT CALLED TO, READ BACK BY AND VERIFIED WITH: PHARMD L BELL [id number] AT 1101 AM BY CM Performed at [facility], [place] [address], [name], [place] [address]    Culture STAPHYLOCOCCUS AUREUS (A)  Final   Report Status 02/01/2022 FINAL  Final   Organism ID, Bacteria STAPHYLOCOCCUS AUREUS  Final      Susceptibility   Staphylococcus aureus - MIC*    CIPROFLOXACIN <=0.5 SENSITIVE Sensitive     ERYTHROMYCIN RESISTANT Resistant     GENTAMICIN <=0.5 SENSITIVE Sensitive     OXACILLIN <=0.25 SENSITIVE Sensitive     TETRACYCLINE <=1 SENSITIVE Sensitive     VANCOMYCIN <=0.5 SENSITIVE Sensitive     TRIMETH/SULFA <=10 SENSITIVE Sensitive     CLINDAMYCIN RESISTANT Resistant     RIFAMPIN <=0.5 SENSITIVE Sensitive     Inducible Clindamycin POSITIVE Resistant     * STAPHYLOCOCCUS AUREUS  Blood Culture ID Panel (Reflexed)     Status: Abnormal    Collection Time: 01/29/22  8:11 PM  Result Value Ref Range Status   Enterococcus faecalis NOT DETECTED NOT DETECTED Final   Enterococcus Faecium NOT DETECTED NOT DETECTED Final   Listeria monocytogenes NOT DETECTED NOT DETECTED Final   Staphylococcus species DETECTED (A) NOT DETECTED Final    Comment: CRITICAL RESULT CALLED TO, READ BACK BY AND VERIFIED WITH: PHARMD L BELL [id number] AT 1101 AM BY CM    Staphylococcus aureus (BCID) DETECTED (A) NOT DETECTED Final    Comment: CRITICAL RESULT CALLED TO, READ BACK BY AND VERIFIED WITH: PHARMD L BELL [id number] AT 1101 AM BY CM    Staphylococcus epidermidis NOT DETECTED NOT DETECTED Final   Staphylococcus lugdunensis NOT DETECTED NOT DETECTED Final   Streptococcus species NOT DETECTED NOT DETECTED Final   Streptococcus agalactiae NOT DETECTED NOT DETECTED Final   Streptococcus pneumoniae NOT DETECTED NOT DETECTED Final   Streptococcus pyogenes NOT DETECTED NOT DETECTED Final   A.calcoaceticus-baumannii NOT DETECTED NOT DETECTED Final   Bacteroides fragilis NOT DETECTED NOT DETECTED Final   Enterobacterales NOT DETECTED NOT DETECTED Final   Enterobacter cloacae complex NOT DETECTED NOT DETECTED Final   Escherichia coli NOT DETECTED NOT DETECTED Final   Klebsiella aerogenes NOT DETECTED NOT DETECTED Final   Klebsiella oxytoca NOT DETECTED NOT DETECTED Final   Klebsiella pneumoniae NOT DETECTED NOT DETECTED Final   Proteus species NOT DETECTED NOT DETECTED Final  Salmonella species NOT DETECTED NOT DETECTED Final   Serratia marcescens NOT DETECTED NOT DETECTED Final   Haemophilus influenzae NOT DETECTED NOT DETECTED Final   Neisseria meningitidis NOT DETECTED NOT DETECTED Final   Pseudomonas aeruginosa NOT DETECTED NOT DETECTED Final   Stenotrophomonas maltophilia NOT DETECTED NOT DETECTED Final   Candida albicans NOT DETECTED NOT DETECTED Final   Candida auris NOT DETECTED NOT DETECTED Final   Candida glabrata NOT DETECTED NOT DETECTED  Final   Candida krusei NOT DETECTED NOT DETECTED Final   Candida parapsilosis NOT DETECTED NOT DETECTED Final   Candida tropicalis NOT DETECTED NOT DETECTED Final   Cryptococcus neoformans/gattii NOT DETECTED NOT DETECTED Final   Meth resistant mecA/C and MREJ NOT DETECTED NOT DETECTED Final    Comment: Performed at [facility], [address], [place], [place] [address]  Blood Culture (routine x 2)     Status: Abnormal   Collection Time: 01/29/22  8:12 PM   Specimen: BLOOD RIGHT FOREARM  Result Value Ref Range Status   Specimen Description BLOOD RIGHT FOREARM  Final   Special Requests   Final    BOTTLES DRAWN AEROBIC AND ANAEROBIC Blood Culture results may not be optimal due to an excessive volume of blood received in culture bottles   Culture  Setup Time   Final    GRAM POSITIVE COCCI IN BOTH AEROBIC AND ANAEROBIC BOTTLES CRITICAL VALUE NOTED.  VALUE IS CONSISTENT WITH PREVIOUSLY REPORTED AND CALLED VALUE.    Culture (A)  Final    STAPHYLOCOCCUS AUREUS SUSCEPTIBILITIES PERFORMED ON PREVIOUS CULTURE WITHIN THE LAST 5 DAYS. Performed at [facility], [place] [address] [place], [place] [address]    Report Status 02/01/2022 FINAL  Final  Resp Panel by RT-PCR (Flu A&B, Covid) Anterior Nasal Swab     Status: None   Collection Time: 01/29/22  8:15 PM   Specimen: Anterior Nasal Swab  Result Value Ref Range Status   SARS Coronavirus 2 by RT PCR NEGATIVE NEGATIVE Final    Comment: (NOTE) SARS-CoV-2 target nucleic acids are NOT DETECTED.  The SARS-CoV-2 RNA is generally detectable in upper respiratory specimens during the acute phase of infection. The lowest concentration of SARS-CoV-2 viral copies this assay can detect is 138 copies/mL. A negative result does not preclude SARS-Cov-2 infection and should not be used as the sole basis for treatment or other patient management decisions. A negative result may occur with  improper specimen collection/handling, submission of  specimen other than nasopharyngeal swab, presence of viral mutation(s) within the areas targeted by this assay, and inadequate number of viral copies(<138 copies/mL). A negative result must be combined with clinical observations, patient history, and epidemiological information. The expected result is Negative.  Fact Sheet for Patients:  EntrepreneurPulse.com.au  Fact Sheet for Healthcare Providers:  IncredibleEmployment.be  This test is no t yet approved or cleared by the [place] FDA and  has been authorized for detection and/or diagnosis of SARS-CoV-2 by FDA under an Emergency Use Authorization (EUA). This EUA will remain  in effect (meaning this test can be used) for the duration of the COVID-19 declaration under Section 564(b)(1) of the Act, 21 U.S.C.section 360bbb-3(b)(1), unless the authorization is terminated  or revoked sooner.       Influenza A by PCR NEGATIVE NEGATIVE Final   Influenza B by PCR NEGATIVE NEGATIVE Final    Comment: (NOTE) The Xpert Xpress SARS-CoV-2/FLU/RSV plus assay is intended as an aid in the diagnosis of influenza from Nasopharyngeal swab  specimens and should not be used as a sole basis for treatment. Nasal washings and aspirates are unacceptable for Xpert Xpress SARS-CoV-2/FLU/RSV testing.  Fact Sheet for Patients: EntrepreneurPulse.com.au  Fact Sheet for Healthcare Providers: IncredibleEmployment.be  This test is not yet approved or cleared by the [place] FDA and has been authorized for detection and/or diagnosis of SARS-CoV-2 by FDA under an Emergency Use Authorization (EUA). This EUA will remain in effect (meaning this test can be used) for the duration of the COVID-19 declaration under Section 564(b)(1) of the Act, 21 U.S.C. section 360bbb-3(b)(1), unless the authorization is terminated or revoked.  Performed at [facility], [place] [address]  [place], [place] [address]   Respiratory (~20 pathogens) panel by PCR     Status: None   Collection Time: 01/29/22  8:15 PM   Specimen: Nasopharyngeal Swab; Respiratory  Result Value Ref Range Status   Adenovirus NOT DETECTED NOT DETECTED Final   Coronavirus 229E NOT DETECTED NOT DETECTED Final    Comment: (NOTE) The Coronavirus on the Respiratory Panel, DOES NOT test for the novel  Coronavirus (2019 nCoV)    Coronavirus HKU1 NOT DETECTED NOT DETECTED Final   Coronavirus NL63 NOT DETECTED NOT DETECTED Final   Coronavirus OC43 NOT DETECTED NOT DETECTED Final   Metapneumovirus NOT DETECTED NOT DETECTED Final   Rhinovirus / Enterovirus NOT DETECTED NOT DETECTED Final   Influenza A NOT DETECTED NOT DETECTED Final   Influenza B NOT DETECTED NOT DETECTED Final   Parainfluenza Virus 1 NOT DETECTED NOT DETECTED Final   Parainfluenza Virus 2 NOT DETECTED NOT DETECTED Final   Parainfluenza Virus 3 NOT DETECTED NOT DETECTED Final   Parainfluenza Virus 4 NOT DETECTED NOT DETECTED Final   Respiratory Syncytial Virus NOT DETECTED NOT DETECTED Final   Bordetella pertussis NOT DETECTED NOT DETECTED Final   Bordetella Parapertussis NOT DETECTED NOT DETECTED Final   Chlamydophila pneumoniae NOT DETECTED NOT DETECTED Final   Mycoplasma pneumoniae NOT DETECTED NOT DETECTED Final    Comment: Performed at [facility], [place]. [address] [place], [place] [address]  Urine Culture     Status: Abnormal   Collection Time: 01/29/22  8:32 PM   Specimen: In/Out Cath Urine  Result Value Ref Range Status   Specimen Description IN/OUT CATH URINE  Final   Special Requests   Final    NONE Performed at [facility], [place] [address], [place], [place] [address]    Culture (A)  Final    200 COLONIES/mL ENTEROCOCCUS FAECALIS 500 COLONIES/mL STAPHYLOCOCCUS HAEMOLYTICUS    Report Status 02/01/2022 FINAL  Final   Organism ID, Bacteria STAPHYLOCOCCUS HAEMOLYTICUS (A)  Final   Organism ID, Bacteria ENTEROCOCCUS  FAECALIS (A)  Final      Susceptibility   Enterococcus faecalis - MIC*    AMPICILLIN <=2 SENSITIVE Sensitive     NITROFURANTOIN <=16 SENSITIVE Sensitive     VANCOMYCIN 1 SENSITIVE Sensitive     * 200 COLONIES/mL ENTEROCOCCUS FAECALIS   Staphylococcus haemolyticus - MIC*    CIPROFLOXACIN <=0.5 SENSITIVE Sensitive     GENTAMICIN <=0.5 SENSITIVE Sensitive     NITROFURANTOIN <=16 SENSITIVE Sensitive     OXACILLIN <=0.25 SENSITIVE Sensitive     TETRACYCLINE <=1 SENSITIVE Sensitive     VANCOMYCIN <=0.5 SENSITIVE Sensitive     TRIMETH/SULFA <=10 SENSITIVE Sensitive     CLINDAMYCIN <=0.25 SENSITIVE Sensitive     RIFAMPIN <=0.5 SENSITIVE Sensitive     Inducible Clindamycin NEGATIVE Sensitive     *  500 COLONIES/mL STAPHYLOCOCCUS HAEMOLYTICUS  MRSA Next Gen by PCR, Nasal     Status: None   Collection Time: 01/30/22  1:06 AM   Specimen: Nasal Mucosa; Nasal Swab  Result Value Ref Range Status   MRSA by PCR Next Gen NOT DETECTED NOT DETECTED Final    Comment: (NOTE) The GeneXpert MRSA Assay (FDA approved for NASAL specimens only), is one component of a comprehensive MRSA colonization surveillance program. It is not intended to diagnose MRSA infection nor to guide or monitor treatment for MRSA infections. Test performance is not FDA approved in patients less than 61 years old. Performed at [facility], [place] [address] [place], [place] [address]   Culture, blood (Routine X 2) w Reflex to ID Panel     Status: None   Collection Time: 01/31/22 11:01 AM   Specimen: BLOOD  Result Value Ref Range Status   Specimen Description BLOOD RIGHT ANTECUBITAL  Final   Special Requests   Final    BOTTLES DRAWN AEROBIC AND ANAEROBIC Blood Culture adequate volume   Culture   Final    NO GROWTH 5 DAYS Performed at [facility], [place] [address], [place], [place] [address]    Report Status 02/05/2022 FINAL  Final  Culture, blood (Routine X 2) w Reflex to ID Panel     Status: None   Collection  Time: 01/31/22 11:06 AM   Specimen: BLOOD RIGHT WRIST  Result Value Ref Range Status   Specimen Description BLOOD RIGHT WRIST  Final   Special Requests   Final    BOTTLES DRAWN AEROBIC AND ANAEROBIC Blood Culture adequate volume   Culture   Final    NO GROWTH 5 DAYS Performed at [facility], [place] [address], [place],  [address]    Report Status 02/05/2022 FINAL  Final    Labs: CBC: Recent Labs  Lab 02/15/22 2340 02/16/22 0929 02/17/22 0235 02/18/22 0440 02/18/22 1909 02/19/22 0428 02/19/22 2022  WBC 8.7 7.7 8.8 8.2  --  6.5  --   NEUTROABS 6.2  --   --   --   --   --   --   HGB 6.8* 8.1* 8.6* 7.5* 7.3* 7.3* 9.0*  HCT 22.0* 24.1* 26.4* 23.5* 23.4* 23.2* 27.3*  MCV 91.3 84.0 84.9 87.7  --  87.9  --   PLT 255 175 212 178  --  165  --    Basic Metabolic Panel: Recent Labs  Lab 02/15/22 2340 02/16/22 0659 02/17/22 0235 02/18/22 0440  NA 130* 133* 131* 132*  K 3.8 4.3 3.7 3.9  CL 97* 101 100 100  CO2 _0 GLUCOSE 135* 104* 90 109*  BUN 31* 28* 20 17  CREATININE 1.27* 1.10 1.08 1.00  CALCIUM 7.6* 7.5* 7.7* 7.6*  MG  --  1.9  --   --    Liver Function Tests: Recent Labs  Lab 02/15/22 2340  AST 23  ALT 6  ALKPHOS 58  BILITOT 0.4  PROT 7.0  ALBUMIN 2.3*   CBG: No results for input(s): "GLUCAP" in the last 168 hours.  Discharge time spent: greater than 30 minutes.  This record has been created using Systems analyst. Errors have been sought and corrected,but may not always be located. Such creation errors do not reflect on the standard of care.   Signed: Lorella Nimrod, MD [facility] 02/20/2022

## 2022-02-20 NOTE — Progress Notes (Signed)
Mobility Specialist Progress Note:   02/20/22 1019  Mobility  Activity Ambulated independently in hallway  Level of Assistance Independent  Assistive Device None  Distance Ambulated (ft) 300 ft  Activity Response Tolerated well  Mobility Referral Yes  $Mobility charge 1 Mobility   Pt received in chair and agreeable. No complaints. Pt left ambulating in room with all needs met, call bell in reach, and family in room.   Andrey Campanile Mobility Specialist Please contact via SecureChat or  Rehab office at [phone]

## 2022-02-20 NOTE — Progress Notes (Signed)
PHARMACY CONSULT NOTE FOR:  OUTPATIENT  PARENTERAL ANTIBIOTIC THERAPY (OPAT)  Indication: MSSA Bacteremia Regimen: Inject 2 g cefazolin into the vein every 8 (eight) hours. End date: 02/28/22   IV antibiotic discharge orders are pended. To discharging provider:  please sign these orders via discharge navigator,  Select New Orders & click on the button choice - Manage This Unsigned Work.     Thank you for allowing pharmacy to be a part of this patient's care.  Jeneen Rinks 48/10/3012, 15:97 AM

## 2022-02-20 NOTE — TOC Transition Note (Addendum)
Transition of Care [facility]) - CM/SW Discharge Note   Patient Details  Name: Erik [name]. MRN: [id number] Date of Birth: April 24, 1940  [facility] CM/SW Contact:  Carles Collet, RN Phone Number: 02/20/2022, 10:28 AM   Clinical Narrative:     [facility] notified of DC, they are able to resume [facility] services. [facility] notified of DC. They will need OPAT order and to confirm there are meds available at home. Requested that 2pm dose be given here.  Pharmacy consulted for OPAT order.  Will await clearance from [facility] for DC.   [facility] has confirmed receipt of OPAT order, meds at home have expired, and they will mix and deliver new IV Abx to the home today. Patient is ok to return home after 2pm dose here. Updated nurse.   Final next level of care: [place] Barriers to Discharge: Continued Medical Work up   Patient Goals and CMS Choice Patient states their goals for this hospitalization and ongoing recovery are:: to get better and return home with wife CMS Medicare.gov Compare Post Acute Care list provided to:: Patient Choice offered to / list presented to : Patient  Discharge Placement                       Discharge Plan and Services   Discharge Planning Services: CM Consult Post Acute Care Choice: Home Health                    HH Arranged: RN [place]: [place] ([facility]) Date [facility] Contacted: 02/17/22 Time [place]: 1620 Representative spoke with at [place]: Lillia [place], [place] with [facility]  Social Determinants of Health (SDOH) Interventions     Readmission Risk Interventions    02/17/2022    4:21 PM  Readmission Risk Prevention Plan  Transportation Screening Complete  PCP or Specialist Appt within 3-5 Days Complete  HRI or [place] Complete  Social Work Consult for [place] Planning/Counseling Complete  Palliative Care Screening Complete  Medication Review Press photographer) Complete

## 2022-02-22 ENCOUNTER — Telehealth: Payer: Self-pay | Admitting: *Deleted

## 2022-02-22 ENCOUNTER — Telehealth: Payer: Self-pay

## 2022-02-22 DIAGNOSIS — E43 Unspecified severe protein-calorie malnutrition: Secondary | ICD-10-CM | POA: Diagnosis not present

## 2022-02-22 DIAGNOSIS — Z792 Long term (current) use of antibiotics: Secondary | ICD-10-CM | POA: Diagnosis not present

## 2022-02-22 DIAGNOSIS — B9561 Methicillin susceptible Staphylococcus aureus infection as the cause of diseases classified elsewhere: Secondary | ICD-10-CM | POA: Diagnosis not present

## 2022-02-22 DIAGNOSIS — I5022 Chronic systolic (congestive) heart failure: Secondary | ICD-10-CM | POA: Diagnosis not present

## 2022-02-22 DIAGNOSIS — I110 Hypertensive heart disease with heart failure: Secondary | ICD-10-CM | POA: Diagnosis not present

## 2022-02-22 DIAGNOSIS — Z452 Encounter for adjustment and management of vascular access device: Secondary | ICD-10-CM | POA: Diagnosis not present

## 2022-02-22 DIAGNOSIS — R7881 Bacteremia: Secondary | ICD-10-CM | POA: Diagnosis not present

## 2022-02-22 DIAGNOSIS — Z5181 Encounter for therapeutic drug level monitoring: Secondary | ICD-10-CM | POA: Diagnosis not present

## 2022-02-22 NOTE — Patient Outreach (Signed)
  Care Coordination [facility] Note Transition Care Management Follow-up Telephone Call Date of discharge and from where: [id number] [facility] How have you been since you were released from the hospital? Doing good. Able to go out today Any questions or concerns? Yes  Items Reviewed: Did the pt receive and understand the discharge instructions provided? Yes  Medications obtained and verified? Yes  Other? No  Any new allergies since your discharge? No  Dietary orders reviewed? Yes low sodium heart healthy Do you have support at home? Yes   Home Care and Equipment/Supplies: Were home health services ordered? yes If so, what is the name of the agency? [facility]  Has the agency set up a time to come to the patient's home? yes Were any new equipment or medical supplies ordered?  Yes:   What is the name of the medical supply agency? [facility] Were you able to get the supplies/equipment? yes Do you have any questions related to the use of the equipment or supplies? Yes:    Functional Questionnaire: (I = Independent and D = Dependent) ADLs: I  Bathing/Dressing- I  Meal Prep- I  Eating- I  Maintaining continence- I  Transferring/Ambulation- I I Managing Meds- I  Follow up appointments reviewed:  PCP Hospital f/u appt confirmed? Tawnya Crook [phone] [facility] f/u appt confirmed? Yes  Cardiology [id number] 10:30 am. Are transportation arrangements needed? No  If their condition worsens, is the pt aware to call PCP or go to the Emergency Dept.? Yes Was the patient provided with contact information for the PCP's office or ED? Yes Was to pt encouraged to call back with questions or concerns? Yes  SDOH assessments and interventions completed:   Yes  SDOH Screenings   Food Insecurity: No Food Insecurity (02/22/2022)  Housing: Low Risk  (02/22/2022)  Transportation Needs: No Transportation Needs (02/22/2022)  Utilities: Not At Risk (02/17/2022)  Alcohol Screen: Low Risk   (05/31/2019)  Depression (PHQ2-9): Low Risk  (07/14/2021)  Financial Resource Strain: Low Risk  (04/29/2021)  Physical Activity: Inactive (05/31/2019)  Stress: No Stress Concern Present (05/31/2019)  Tobacco Use: High Risk (02/16/2022)     Care Coordination Interventions:  No Care Coordination interventions needed at this time.   Encounter Outcome:  Pt. Visit Completed    [place] Management [phone]

## 2022-02-22 NOTE — Telephone Encounter (Signed)
Received a call from Amy [phone]) [place] with [facility] asking if patient's end date was still 12/17 since patient does not see Dr. Baxter Flattery till 1/4. Per Dr. Baxter Flattery ok to pull picc on schedule end date 12/17 and follow up with her in January. Amy is aware and had no further questions.

## 2022-02-23 ENCOUNTER — Telehealth: Payer: Self-pay | Admitting: Family Medicine

## 2022-02-23 ENCOUNTER — Ambulatory Visit (INDEPENDENT_AMBULATORY_CARE_PROVIDER_SITE_OTHER): Payer: Medicare Other | Admitting: Family Medicine

## 2022-02-23 ENCOUNTER — Encounter: Payer: Self-pay | Admitting: Family Medicine

## 2022-02-23 VITALS — BP 130/60 | HR 61 | Temp 98.2°F | Ht 66.0 in | Wt 115.4 lb

## 2022-02-23 DIAGNOSIS — I6523 Occlusion and stenosis of bilateral carotid arteries: Secondary | ICD-10-CM

## 2022-02-23 DIAGNOSIS — R7881 Bacteremia: Secondary | ICD-10-CM | POA: Diagnosis not present

## 2022-02-23 DIAGNOSIS — R040 Epistaxis: Secondary | ICD-10-CM

## 2022-02-23 DIAGNOSIS — D508 Other iron deficiency anemias: Secondary | ICD-10-CM | POA: Diagnosis not present

## 2022-02-23 DIAGNOSIS — E871 Hypo-osmolality and hyponatremia: Secondary | ICD-10-CM

## 2022-02-23 DIAGNOSIS — B9561 Methicillin susceptible Staphylococcus aureus infection as the cause of diseases classified elsewhere: Secondary | ICD-10-CM

## 2022-02-23 DIAGNOSIS — I10 Essential (primary) hypertension: Secondary | ICD-10-CM | POA: Diagnosis not present

## 2022-02-23 DIAGNOSIS — I5022 Chronic systolic (congestive) heart failure: Secondary | ICD-10-CM | POA: Diagnosis not present

## 2022-02-23 NOTE — Telephone Encounter (Signed)
Please notify [facility] Home health orders for of the PICC line need to come from Dr. Baxter Flattery who is with infectious disease. Today he stated he had no physical,therapy occupational therapy  or home aide needs at this time.

## 2022-02-23 NOTE — Telephone Encounter (Signed)
Amy from [facility] called asking if [name] wanted to give orders for Home Health for pt? Amy knows [facility] isn't the provider suggesting HH, Dr. Baxter Flattery is. Amy is requesting a call back for more concerns/questions @ [phone].

## 2022-02-23 NOTE — Patient Instructions (Signed)
Please stop at the lab to have labs drawn.  

## 2022-02-24 LAB — COMPREHENSIVE METABOLIC PANEL
ALT: 1 U/L (ref 0–53)
AST: 16 U/L (ref 0–37)
Albumin: 3 g/dL — ABNORMAL LOW (ref 3.5–5.2)
Alkaline Phosphatase: 67 U/L (ref 39–117)
BUN: 17 mg/dL (ref 6–23)
CO2: 33 mEq/L — ABNORMAL HIGH (ref 19–32)
Calcium: 8.2 mg/dL — ABNORMAL LOW (ref 8.4–10.5)
Chloride: 97 mEq/L (ref 96–112)
Creatinine, Ser: 1.14 mg/dL (ref 0.40–1.50)
GFR: 60.66 mL/min (ref >60.00)
Glucose, Bld: 92 mg/dL (ref 70–99)
Potassium: 4.5 mEq/L (ref 3.5–5.1)
Sodium: 135 mEq/L (ref 135–145)
Total Bilirubin: 0.2 mg/dL (ref 0.2–1.2)
Total Protein: 7.1 g/dL (ref 6.0–8.3)

## 2022-02-24 LAB — CBC WITH DIFFERENTIAL/PLATELET
Basophils Absolute: 0.1 10*3/uL (ref 0.0–0.1)
Basophils Relative: 1.5 % (ref 0.0–3.0)
Eosinophils Absolute: 0.6 10*3/uL (ref 0.0–0.7)
Eosinophils Relative: 8.7 % — ABNORMAL HIGH (ref 0.0–5.0)
HCT: 31.7 % — ABNORMAL LOW (ref 39.0–52.0)
Hemoglobin: 10.4 g/dL — ABNORMAL LOW (ref 13.0–17.0)
Lymphocytes Relative: 14 % (ref 12.0–46.0)
Lymphs Abs: 0.9 10*3/uL (ref 0.7–4.0)
MCHC: 32.7 g/dL (ref 30.0–36.0)
MCV: 86.7 fl (ref 78.0–100.0)
Monocytes Absolute: 0.9 10*3/uL (ref 0.1–1.0)
Monocytes Relative: 13.2 % — ABNORMAL HIGH (ref 3.0–12.0)
Neutro Abs: 4.2 10*3/uL (ref 1.4–7.7)
Neutrophils Relative %: 62.6 % (ref 43.0–77.0)
Platelets: 264 10*3/uL (ref 150.0–400.0)
RBC: 3.66 Mil/uL — ABNORMAL LOW (ref 4.22–5.81)
RDW: 17.8 % — ABNORMAL HIGH (ref 11.5–15.5)
WBC: 6.7 10*3/uL (ref 4.0–10.5)

## 2022-02-24 NOTE — Telephone Encounter (Signed)
Verbal orders given to Amy via telephone for skilled nursing as requested

## 2022-02-24 NOTE — Telephone Encounter (Signed)
Amy with [facility] notified as instructed by telephone.  She was hoping to get orders for just skilled nursing to continuing going out 1 x week for the next 4 to 5 weeks until his certification period ends for CHF and medication management for anemia and nose bleeds.  She is just concerned because he has been hospitalized 2 times in the last 2 months.  Please advise.  Okay to given orders just for skilled nursing.

## 2022-02-24 NOTE — Telephone Encounter (Signed)
Okay for skilled nursing orders as requested

## 2022-03-01 DIAGNOSIS — E43 Unspecified severe protein-calorie malnutrition: Secondary | ICD-10-CM | POA: Diagnosis not present

## 2022-03-01 DIAGNOSIS — I110 Hypertensive heart disease with heart failure: Secondary | ICD-10-CM | POA: Diagnosis not present

## 2022-03-01 DIAGNOSIS — I5022 Chronic systolic (congestive) heart failure: Secondary | ICD-10-CM | POA: Diagnosis not present

## 2022-03-01 DIAGNOSIS — R7881 Bacteremia: Secondary | ICD-10-CM | POA: Diagnosis not present

## 2022-03-01 DIAGNOSIS — Z452 Encounter for adjustment and management of vascular access device: Secondary | ICD-10-CM | POA: Diagnosis not present

## 2022-03-01 DIAGNOSIS — B9561 Methicillin susceptible Staphylococcus aureus infection as the cause of diseases classified elsewhere: Secondary | ICD-10-CM | POA: Diagnosis not present

## 2022-03-04 ENCOUNTER — Other Ambulatory Visit: Payer: Self-pay | Admitting: Family Medicine

## 2022-03-04 NOTE — Telephone Encounter (Signed)
Last office visit 02/23/22 for hospital follow up.  Last refilled 02/09/22 for #30 with no refills.  Next Appt: 05/25/22 for multiple issues.

## 2022-03-07 DIAGNOSIS — G2581 Restless legs syndrome: Secondary | ICD-10-CM | POA: Diagnosis not present

## 2022-03-07 DIAGNOSIS — Z9841 Cataract extraction status, right eye: Secondary | ICD-10-CM | POA: Diagnosis not present

## 2022-03-07 DIAGNOSIS — I255 Ischemic cardiomyopathy: Secondary | ICD-10-CM | POA: Diagnosis not present

## 2022-03-07 DIAGNOSIS — Z955 Presence of coronary angioplasty implant and graft: Secondary | ICD-10-CM | POA: Diagnosis not present

## 2022-03-07 DIAGNOSIS — I110 Hypertensive heart disease with heart failure: Secondary | ICD-10-CM | POA: Diagnosis not present

## 2022-03-07 DIAGNOSIS — I2510 Atherosclerotic heart disease of native coronary artery without angina pectoris: Secondary | ICD-10-CM | POA: Diagnosis not present

## 2022-03-07 DIAGNOSIS — Z87891 Personal history of nicotine dependence: Secondary | ICD-10-CM | POA: Diagnosis not present

## 2022-03-07 DIAGNOSIS — Z961 Presence of intraocular lens: Secondary | ICD-10-CM | POA: Diagnosis not present

## 2022-03-07 DIAGNOSIS — R7881 Bacteremia: Secondary | ICD-10-CM | POA: Diagnosis not present

## 2022-03-07 DIAGNOSIS — J449 Chronic obstructive pulmonary disease, unspecified: Secondary | ICD-10-CM | POA: Diagnosis not present

## 2022-03-07 DIAGNOSIS — M359 Systemic involvement of connective tissue, unspecified: Secondary | ICD-10-CM | POA: Diagnosis not present

## 2022-03-07 DIAGNOSIS — E785 Hyperlipidemia, unspecified: Secondary | ICD-10-CM | POA: Diagnosis not present

## 2022-03-07 DIAGNOSIS — N400 Enlarged prostate without lower urinary tract symptoms: Secondary | ICD-10-CM | POA: Diagnosis not present

## 2022-03-07 DIAGNOSIS — D649 Anemia, unspecified: Secondary | ICD-10-CM | POA: Diagnosis not present

## 2022-03-07 DIAGNOSIS — I719 Aortic aneurysm of unspecified site, without rupture: Secondary | ICD-10-CM | POA: Diagnosis not present

## 2022-03-07 DIAGNOSIS — K5790 Diverticulosis of intestine, part unspecified, without perforation or abscess without bleeding: Secondary | ICD-10-CM | POA: Diagnosis not present

## 2022-03-07 DIAGNOSIS — I5022 Chronic systolic (congestive) heart failure: Secondary | ICD-10-CM | POA: Diagnosis not present

## 2022-03-07 DIAGNOSIS — R011 Cardiac murmur, unspecified: Secondary | ICD-10-CM | POA: Diagnosis not present

## 2022-03-07 DIAGNOSIS — R040 Epistaxis: Secondary | ICD-10-CM | POA: Diagnosis not present

## 2022-03-07 DIAGNOSIS — I6523 Occlusion and stenosis of bilateral carotid arteries: Secondary | ICD-10-CM | POA: Diagnosis not present

## 2022-03-07 DIAGNOSIS — K219 Gastro-esophageal reflux disease without esophagitis: Secondary | ICD-10-CM | POA: Diagnosis not present

## 2022-03-07 DIAGNOSIS — E43 Unspecified severe protein-calorie malnutrition: Secondary | ICD-10-CM | POA: Diagnosis not present

## 2022-03-07 DIAGNOSIS — B9561 Methicillin susceptible Staphylococcus aureus infection as the cause of diseases classified elsewhere: Secondary | ICD-10-CM | POA: Diagnosis not present

## 2022-03-07 DIAGNOSIS — Z452 Encounter for adjustment and management of vascular access device: Secondary | ICD-10-CM | POA: Diagnosis not present

## 2022-03-07 DIAGNOSIS — Z9842 Cataract extraction status, left eye: Secondary | ICD-10-CM | POA: Diagnosis not present

## 2022-03-08 NOTE — Progress Notes (Incomplete)
Patient ID: Erik [name]., male   DOB: 1940-11-15, 81 y.o.   MRN: [id number] PCP: Dr [name] Vascular: Dr Scot Dock Cardiology: Dr Aundra Dubin   HPI: Erik [name] is a 81 y.o. with a history of COPD, HTN , PAD with bilateral CEAs, ICM, CAD s/p PCI of proximal LAD and proximal RCA (10/1854), chronic systolic HF and tobacco abuse.  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 Sammuel [name]. Also had left renal artery stenting. Now followed at [facility].   Admitted with dyspnea in 08/2013. Found to have 2v CAD and EF 20-25%, which was a new finding. He wore a Lifevest. EF recovered on 05/2015 echo to 55-60%.   Echo was done in 5/20, showing EF 45-50% with septal hypokinesis, mild LVH, moderate AI.  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  Echo in 5/22 showed EF 40-45%, mild LVH, RV normal, moderate Erik, moderate AI.  Admitted 02/15/22 with recurrent epistaxis and symptomatic anemia. Had Rhino Rocket placed and given 2 units PRBCs. Aspirin and placix held for a couple of days.   Today he returns for HF follow up.Overall feeling fine. Denies SOB/PND/Orthopnea. Appetite ok. No fever or chills. Weight at home  pounds. Taking all medications  Labs 03/2016, K 3.8, creatinine 1.00 Labs 09/13/13 K 4.1 Creatinine 0.95  Labs 10/15/13 K 4.1 Cr 0.7 => 1.07 Labs 2/19: K 4.2, creatinine 0.98, LDL 49, HDL 43 Labs 5/20: K 4.2, creatinine 0.99, LDL 44, hgb 12.3 Labs 3/21: LDL 48, K 4.1, creatinine 0.91 Labs 4/22 LDL 37, K 4.2, creatinine 1.18 Labs 4/23: LDL 44 Labs 7/23: K 4.8, creatinine 1.11  ROS: All systems negative except as listed in HPI, PMH and Problem List.  Family Status  Relation Name Status   Brother  Deceased   Mother  Alive       HTN, Born [birth year]   Father  Deceased       brain tumor   Brother  Alive       HLD, Heart disease   Brother  Alive       HLD, CAD   Brother  Alive       HLD, MS   Neg Hx  (Not Specified)    Social  History   Socioeconomic History   Marital status: Married    Spouse name: Not on file   Number of children: Not on file   Years of education: Not on file   Highest education level: Not on file  Occupational History   Occupation: Retired Development worker, community  Tobacco Use   Smoking status: Every Day    Packs/day: 1.00    Years: 50.00    Total pack years: 50.00    Types: Cigarettes   Smokeless tobacco: Current   Tobacco comments:    off and on since age 81, currently smokes a pack a day [facility] 12/23/20  Vaping Use   Vaping Use: Never used  Substance and Sexual Activity   Alcohol use: No   Drug use: No   Sexual activity: Yes  Other Topics Concern   Not on file  Social History Narrative   Lives with wife in [place]. Retired from the post [employer]   Social Determinants of [place] Strain: Low Risk  (04/29/2021)   Overall Financial Resource Strain (CARDIA)    Difficulty of Paying Living Expenses: Not very hard  Food Insecurity: No Food Insecurity (02/22/2022)  Hunger Vital Sign    Worried About Running Out of Food in the Last Year: Never true    Ran Out of Food in the Last Year: Never true  Transportation Needs: No Transportation Needs (02/22/2022)   PRAPARE - Hydrologist (Medical): No    Lack of Transportation (Non-Medical): No  Physical Activity: Inactive (05/31/2019)   Exercise Vital Sign    Days of Exercise per Week: 0 days    Minutes of Exercise per Session: 0 min  Stress: No Stress Concern Present (05/31/2019)   [place]    Feeling of Stress : Not at all  Social Connections: Not on file  Intimate Partner Violence: Not At Risk (02/17/2022)   Humiliation, Afraid, Rape, and Kick questionnaire    Fear of Current or Ex-Partner: No    Emotionally Abused: No    Physically Abused: No    Sexually Abused: No    Past Medical History:  Diagnosis Date   Adenomatous colon  polyp    Aortic aneurysm (HCC)    BPH (benign prostatic hypertrophy)    CAD (coronary artery disease)    Carotid artery stenosis    a. Bilateral CEA   Cataract    CHF (congestive heart failure) (HCC)    Chronic systolic heart failure (HCC)    a. EF 20-25%, mild LVH, mod HK, mid apicalanteroseptal myocardium, mild Erik, LA mod dilated   Collagen vascular disease (HCC)    COPD (chronic obstructive pulmonary disease) (HCC)    Coronary artery disease    a. LHC (08/2013): Lmain: short 30% distal, LAD: sml D1 & D2, 70% ostial D1, 95-99% LAD stenosis prox D2 LCx: sml/mod ramus subtot. occluded, 40% ostial set off lg OM1, 40% AV LCx after OM1, RCA: 90% prox (DES to RCA and prox LAD)   Diverticulosis    GERD (gastroesophageal reflux disease)    Heart murmur    History of colonic polyps    Hyperlipidemia    Hyperplastic colon polyp    Hypertension    Ischemic cardiomyopathy    RLS (restless legs syndrome)     Current Outpatient Medications  Medication Sig Dispense Refill   acetaminophen (TYLENOL) 500 MG tablet Take 1,000 mg by mouth at bedtime.     aspirin EC 81 MG tablet Take 81 mg by mouth every evening.      clopidogrel (PLAVIX) 75 MG tablet TAKE 1 TABLET BY MOUTH EVERY DAY 90 tablet 3   cyanocobalamin (VITAMIN B12) 1000 MCG tablet Take 1,000 mcg by mouth daily.     dapagliflozin propanediol (FARXIGA) 10 MG TABS tablet Take 1 tablet (10 mg total) by mouth daily before breakfast. 30 tablet 11   ezetimibe-simvastatin (VYTORIN) 10-40 MG tablet TAKE 1 TABLET BY MOUTH EVERYDAY AT BEDTIME 90 tablet 3   famotidine (PEPCID) 40 MG tablet Take 40 mg by mouth 2 (two) times daily.     ferrous sulfate 325 (65 FE) MG tablet Take 325 mg by mouth daily with breakfast.     furosemide (LASIX) 20 MG tablet Take 1 tablet (20 mg total) by mouth daily. 30 tablet    methocarbamol (ROBAXIN) 500 MG tablet TAKE 1 TABLET (500 MG TOTAL) BY MOUTH EVERY DAY AT BEDTIME AS NEEDED FOR MUSCLE SPASM 30 tablet 0   metoprolol  succinate (TOPROL-XL) 50 MG 24 hr tablet TAKE 1 TABLET BY MOUTH EVERY DAY WITH OR IMMEDIATELY FOLLOWING MEAL (NEEDS OFFICE VISIT) 90 tablet 0  mirtazapine (REMERON) 15 MG tablet Take 15 mg by mouth at bedtime.     nitroGLYCERIN (NITROSTAT) 0.4 MG SL tablet Place 1 tablet (0.4 mg total) under the tongue every 5 (five) minutes x 3 doses as needed for chest pain. 30 tablet 3   Omega-3 Fatty Acids (FISH OIL) 1000 MG CAPS Take 1 capsule by mouth daily.     oxymetazoline (AFRIN) 0.05 % nasal spray Place 2 sprays into both nostrils 2 (two) times daily as needed (For nose bleeding). 15 mL 2   rOPINIRole (REQUIP) 3 MG tablet TAKE 1/2 TABLET BY MOUTH IN THE MORNING & TAKE 1 TABLET AT NIGHT 135 tablet 1   sodium chloride (OCEAN) 0.65 % SOLN nasal spray Place 2 sprays into both nostrils every hour as needed (dryness). 88 mL 2   spironolactone (ALDACTONE) 25 MG tablet TAKE 1/2 TABLET BY MOUTH EVERY DAY 45 tablet 3   tamsulosin (FLOMAX) 0.4 MG CAPS capsule TAKE 1 CAPSULE BY MOUTH EVERY DAY 90 capsule 3   No current facility-administered medications for this visit.    There were no vitals filed for this visit.   PHYSICAL EXAM: General:  Well appearing. No resp difficulty HEENT: normal Neck: supple. no JVD. Carotids 2+ bilat; no bruits. No lymphadenopathy or thryomegaly appreciated. Cor: PMI nondisplaced. Regular rate & rhythm. No rubs, gallops or murmurs. Lungs: clear Abdomen: soft, nontender, nondistended. No hepatosplenomegaly. No bruits or masses. Good bowel sounds. Extremities: no cyanosis, clubbing, rash, edema Neuro: alert & orientedx3, cranial nerves grossly intact. moves all 4 extremities w/o difficulty. Affect pleasant  ASSESSMENT & PLAN:  1. Chronic Systolic Heart Failure: Ischemic cardiomyopathy, EF 20-25% (08/2013), EF recovered on 2017 echo with EF 55-60%.  Most recent echo in 5/20 with EF 45-50%, septal hypokinesis, moderate AI.  Cardiolite in 6/20 with EF 40%, no ischemia, old apical  septal MI.  Echo in 5/22 with EF 40-45%.  Chronic LBBB.  NYHA  - Farxiga 10 mg daily. - Continue Entresto 24/26 bid.   - Continue Toprol XL 25 mg daily.  - Continue spironolactone 12.5 mg daily.  - Move Toprol XL and spironolactone to qhs to limit orthostatic symptoms.  -  2. CAD: LHC 09/11/13 with 2 vessel disease that required PCI of proximal LAD and proximal RCA.  Cardiolite in 6/20 with no ischemia but fixed apical septal defect.  No recent chest pain.  - Continue Plavix + ASA. Will be on Plavix indefinitely.  - Continue Vytorin, good lipids in 4/23.  3. COPD: Still smoking, I urged him to quit. He failed Chantix and does not want to try again.  4. H/O of bilateral CEAs: Followed by [facility]. - Continue ASA and Plavix.  - Carotid dopplers will be done at [facility].  5. History of aortobiiliac endograft with Dr. Sammuel [name] at [facility]. Follows for PAD with [facility] now. No claudication.  - Sees [facility] regularly.  6. Hyperlipidemia: Good lipids in 4/23 on Vytorin.   Follow up with Dr Aundra Dubin in 3-4 months wth Dr Precious Bard 03/08/2022

## 2022-03-09 ENCOUNTER — Emergency Department (HOSPITAL_COMMUNITY): Payer: Medicare Other

## 2022-03-09 ENCOUNTER — Emergency Department (HOSPITAL_COMMUNITY)
Admission: EM | Admit: 2022-03-09 | Discharge: 2022-03-09 | Disposition: A | Discharge status: Home / Self Care | Payer: Medicare Other | Attending: Emergency Medicine | Admitting: Emergency Medicine

## 2022-03-09 ENCOUNTER — Encounter (HOSPITAL_COMMUNITY): Payer: Self-pay | Admitting: Emergency Medicine

## 2022-03-09 ENCOUNTER — Encounter (HOSPITAL_COMMUNITY): Payer: Medicare Other

## 2022-03-09 DIAGNOSIS — Z7984 Long term (current) use of oral hypoglycemic drugs: Secondary | ICD-10-CM | POA: Insufficient documentation

## 2022-03-09 DIAGNOSIS — N50812 Left testicular pain: Secondary | ICD-10-CM | POA: Diagnosis not present

## 2022-03-09 DIAGNOSIS — J449 Chronic obstructive pulmonary disease, unspecified: Secondary | ICD-10-CM | POA: Diagnosis not present

## 2022-03-09 DIAGNOSIS — I110 Hypertensive heart disease with heart failure: Secondary | ICD-10-CM | POA: Diagnosis not present

## 2022-03-09 DIAGNOSIS — Z79899 Other long term (current) drug therapy: Secondary | ICD-10-CM | POA: Diagnosis not present

## 2022-03-09 DIAGNOSIS — R109 Unspecified abdominal pain: Secondary | ICD-10-CM | POA: Diagnosis not present

## 2022-03-09 DIAGNOSIS — R1032 Left lower quadrant pain: Secondary | ICD-10-CM | POA: Diagnosis present

## 2022-03-09 DIAGNOSIS — N503 Cyst of epididymis: Secondary | ICD-10-CM | POA: Diagnosis not present

## 2022-03-09 DIAGNOSIS — I2510 Atherosclerotic heart disease of native coronary artery without angina pectoris: Secondary | ICD-10-CM | POA: Insufficient documentation

## 2022-03-09 DIAGNOSIS — I5022 Chronic systolic (congestive) heart failure: Secondary | ICD-10-CM | POA: Insufficient documentation

## 2022-03-09 DIAGNOSIS — Z7982 Long term (current) use of aspirin: Secondary | ICD-10-CM | POA: Insufficient documentation

## 2022-03-09 DIAGNOSIS — N433 Hydrocele, unspecified: Secondary | ICD-10-CM | POA: Diagnosis not present

## 2022-03-09 LAB — COMPREHENSIVE METABOLIC PANEL
ALT: 9 U/L (ref 0–44)
AST: 19 U/L (ref 15–41)
Albumin: 2.8 g/dL — ABNORMAL LOW (ref 3.5–5.0)
Alkaline Phosphatase: 55 U/L (ref 38–126)
Anion gap: 6 (ref 5–15)
BUN: 18 mg/dL (ref 8–23)
CO2: 25 mmol/L (ref 22–32)
Calcium: 8.6 mg/dL — ABNORMAL LOW (ref 8.9–10.3)
Chloride: 102 mmol/L (ref 98–111)
Creatinine, Ser: 1.15 mg/dL (ref 0.61–1.24)
GFR, Estimated: >60 mL/min (ref >60)
Glucose, Bld: 106 mg/dL — ABNORMAL HIGH (ref 70–99)
Potassium: 4.4 mmol/L (ref 3.5–5.1)
Sodium: 133 mmol/L — ABNORMAL LOW (ref 135–145)
Total Bilirubin: 0.2 mg/dL — ABNORMAL LOW (ref 0.3–1.2)
Total Protein: 7.9 g/dL (ref 6.5–8.1)

## 2022-03-09 LAB — URINALYSIS, ROUTINE W REFLEX MICROSCOPIC
Bilirubin Urine: NEGATIVE
Glucose, UA: 150 mg/dL — AB
Hgb urine dipstick: NEGATIVE
Ketones, ur: NEGATIVE mg/dL
Leukocytes,Ua: NEGATIVE
Nitrite: NEGATIVE
Protein, ur: NEGATIVE mg/dL
Specific Gravity, Urine: >1.046 — ABNORMAL HIGH (ref 1.005–1.030)
pH: 5 (ref 5.0–8.0)

## 2022-03-09 LAB — CBC
HCT: 33.5 % — ABNORMAL LOW (ref 39.0–52.0)
Hemoglobin: 10.4 g/dL — ABNORMAL LOW (ref 13.0–17.0)
MCH: 27.8 pg (ref 26.0–34.0)
MCHC: 31 g/dL (ref 30.0–36.0)
MCV: 89.6 fL (ref 80.0–100.0)
Platelets: 304 10*3/uL (ref 150–400)
RBC: 3.74 MIL/uL — ABNORMAL LOW (ref 4.22–5.81)
RDW: 16.8 % — ABNORMAL HIGH (ref 11.5–15.5)
WBC: 11.8 10*3/uL — ABNORMAL HIGH (ref 4.0–10.5)
nRBC: 0 % (ref 0.0–0.2)

## 2022-03-09 LAB — LIPASE, BLOOD: Lipase: 61 U/L — ABNORMAL HIGH (ref 11–51)

## 2022-03-09 MED ORDER — OXYCODONE-ACETAMINOPHEN 5-325 MG PO TABS
1.0000 | ORAL_TABLET | Freq: Once | ORAL | Status: AC
  Administered 2022-03-09: 1 via ORAL
  Filled 2022-03-09: qty 1

## 2022-03-09 MED ORDER — IOHEXOL 350 MG/ML SOLN
75.0000 mL | Freq: Once | INTRAVENOUS | Status: AC | PRN
  Administered 2022-03-09: 75 mL via INTRAVENOUS

## 2022-03-09 NOTE — ED Triage Notes (Signed)
Pt endorses LLQ abd pain since 6 am today. Denies n/v/d. Last BM today and was normal.

## 2022-03-09 NOTE — ED Provider Notes (Signed)
Care of patient assumed from Dr. Alvino Chapel.  This patient presents for left lower abdominal pain that radiates to scrotum.  His testicle is tender.  He is currently awaiting scrotal ultrasound and urinalysis.  Discharge is anticipated. Physical Exam  BP (!) 167/63   Pulse 62   Temp 98 F (36.7 C) (Oral)   Resp 16   Ht '5\' 6"'$  (1.676 m)   Wt 52 kg   SpO2 100%   BMI 18.50 kg/m   Physical Exam Vitals and nursing note reviewed.  Constitutional:      General: He is not in acute distress.    Appearance: He is well-developed.  HENT:     Head: Normocephalic and atraumatic.  Eyes:     Conjunctiva/sclera: Conjunctivae normal.  Cardiovascular:     Rate and Rhythm: Normal rate and regular rhythm.  Pulmonary:     Effort: Pulmonary effort is normal. No respiratory distress.  Abdominal:     Palpations: Abdomen is soft.     Tenderness: There is no abdominal tenderness.  Musculoskeletal:        General: No swelling.     Cervical back: Neck supple.  Skin:    General: Skin is warm and dry.     Capillary Refill: Capillary refill takes less than 2 seconds.  Neurological:     General: No focal deficit present.     Mental Status: He is alert and oriented to person, place, and time.  Psychiatric:        Mood and Affect: Mood normal.        Behavior: Behavior normal.     Procedures  Procedures  ED Course / MDM    Medical Decision Making Amount and/or Complexity of Data Reviewed Labs: ordered. Radiology: ordered.  Risk Prescription drug management.   Ultrasound showed a small sized hydrocele only.  Although this could be reactive, this is unlikely to be causing his pain.  There were no other acute findings to explain his pain.  Urinalysis did not show any evidence of infection.  I have low suspicion of orchitis or epididymitis.  Patient states that his pain resolved following single dose of Percocet in the ED.  He was advised to follow-up with his primary care doctor.  He was discharged  in good condition.       Godfrey Pick, MD 03/09/22 [phone]

## 2022-03-09 NOTE — ED Notes (Signed)
I personally went down to the lab and urine specimen was found.

## 2022-03-09 NOTE — Discharge Instructions (Addendum)
Follow-up with your primary care doctor for management of chronic pain.  If you need to establish care with a urologist, a telephone number is listed below.  Call for follow-up of any ongoing testicular discomfort.  Return to the emergency department for any new or worsening symptoms of concern.

## 2022-03-09 NOTE — ED Provider Notes (Signed)
[facility] EMERGENCY DEPARTMENT Provider Note   CSN: [id number] Arrival date & time: 03/09/22  0759     History  Chief Complaint  Patient presents with   Abdominal Pain    Erik [name]. is a 81 y.o. male.   Abdominal Pain Patient with abdominal pain.  Left lower quadrant.  Began earlier today at around 6 AM.  Severe.  No dysuria.  Has been losing weight over the last year without trying.  PCP is worked up without cause.  States he has had scans.  No dysuria.  Pain improved now after oxycodone at home.    Past Medical History:  Diagnosis Date   Adenomatous colon polyp    Aortic aneurysm (HCC)    BPH (benign prostatic hypertrophy)    CAD (coronary artery disease)    Carotid artery stenosis    a. Bilateral CEA   Cataract    CHF (congestive heart failure) (HCC)    Chronic systolic heart failure (HCC)    a. EF 20-25%, mild LVH, mod HK, mid apicalanteroseptal myocardium, mild MR, LA mod dilated   Collagen vascular disease (HCC)    COPD (chronic obstructive pulmonary disease) ([place])    Coronary artery disease    a. LHC (08/2013): Lmain: short 30% distal, LAD: sml D1 & D2, 70% ostial D1, 95-99% LAD stenosis prox D2 LCx: sml/mod ramus subtot. occluded, 40% ostial set off lg OM1, 40% AV LCx after OM1, RCA: 90% prox (DES to RCA and prox LAD)   Diverticulosis    GERD (gastroesophageal reflux disease)    Heart murmur    History of colonic polyps    Hyperlipidemia    Hyperplastic colon polyp    Hypertension    Ischemic cardiomyopathy    RLS (restless legs syndrome)     Home Medications Prior to Admission medications   Medication Sig Start Date End Date Taking? Authorizing Provider  acetaminophen (TYLENOL) 500 MG tablet Take 1,000 mg by mouth at bedtime.    [provider]  aspirin EC 81 MG tablet Take 81 mg by mouth every evening.     [provider]  clopidogrel (PLAVIX) 75 MG tablet TAKE 1 TABLET BY MOUTH EVERY DAY 11/30/21    Larey Dresser, MD  cyanocobalamin (VITAMIN B12) 1000 MCG tablet Take 1,000 mcg by mouth daily.    [provider]  dapagliflozin propanediol (FARXIGA) 10 MG TABS tablet Take 1 tablet (10 mg total) by mouth daily before breakfast. 11/27/21   Larey Dresser, MD  ezetimibe-simvastatin (VYTORIN) 10-40 MG tablet TAKE 1 TABLET BY MOUTH EVERYDAY AT BEDTIME 07/17/21   Bedsole, Amy E, MD  famotidine (PEPCID) 40 MG tablet Take 40 mg by mouth 2 (two) times daily.    [provider]  ferrous sulfate 325 (65 FE) MG tablet Take 325 mg by mouth daily with breakfast.    [provider]  furosemide (LASIX) 20 MG tablet Take 1 tablet (20 mg total) by mouth daily. 22/29/79   Delora Fuel, MD  methocarbamol (ROBAXIN) 500 MG tablet TAKE 1 TABLET (500 MG TOTAL) BY MOUTH EVERY DAY AT BEDTIME AS NEEDED FOR MUSCLE SPASM 03/04/22   Bedsole, Amy E, MD  metoprolol succinate (TOPROL-XL) 50 MG 24 hr tablet TAKE 1 TABLET BY MOUTH EVERY DAY WITH OR IMMEDIATELY FOLLOWING MEAL (NEEDS OFFICE VISIT) 12/29/21   Larey Dresser, MD  mirtazapine (REMERON) 15 MG tablet Take 15 mg by mouth at bedtime. 11/07/21   [provider]  nitroGLYCERIN (  NITROSTAT) 0.4 MG SL tablet Place 1 tablet (0.4 mg total) under the tongue every 5 (five) minutes x 3 doses as needed for chest pain. 02/12/16   Bedsole, Amy E, MD  Omega-3 Fatty Acids (FISH OIL) 1000 MG CAPS Take 1 capsule by mouth daily.    [provider]  oxymetazoline (AFRIN) 0.05 % nasal spray Place 2 sprays into both nostrils 2 (two) times daily as needed (For nose bleeding). 02/20/22 02/20/23  Lorella Nimrod, MD  rOPINIRole (REQUIP) 3 MG tablet TAKE 1/2 TABLET BY MOUTH IN THE MORNING & TAKE 1 TABLET AT NIGHT 03/04/22   Bedsole, Amy E, MD  sodium chloride (OCEAN) 0.65 % SOLN nasal spray Place 2 sprays into both nostrils every hour as needed (dryness). 02/20/22   Lorella Nimrod, MD  spironolactone (ALDACTONE) 25 MG tablet TAKE 1/2 TABLET BY MOUTH EVERY  DAY 11/18/21   Larey Dresser, MD  tamsulosin (FLOMAX) 0.4 MG CAPS capsule TAKE 1 CAPSULE BY MOUTH EVERY DAY 08/24/21   [name], MD      Allergies    Codeine    Review of Systems   Review of Systems  Gastrointestinal:  Positive for abdominal pain.    Physical Exam Updated Vital Signs BP 135/66   Pulse 72   Temp 98 F (36.7 C) (Oral)   Resp 14   Ht '5\' 6"'$  (1.676 m)   Wt 52 kg   SpO2 99%   BMI 18.50 kg/m  Physical Exam Vitals and nursing note reviewed.  Pulmonary:     Breath sounds: Normal breath sounds.  Abdominal:     Hernia: No hernia is present.  Genitourinary:    Comments: Tenderness to left testicle.  Has some fluid in hemiscrotum potentially hydrocele. Skin:    General: Skin is warm.     Capillary Refill: Capillary refill takes less than 2 seconds.  Neurological:     Mental Status: He is alert.     ED Results / Procedures / Treatments   Labs (all labs ordered are listed, but only abnormal results are displayed) Labs Reviewed  LIPASE, BLOOD - Abnormal; Notable for the following components:      Result Value   Lipase 61 (*)    All other components within normal limits  COMPREHENSIVE METABOLIC PANEL - Abnormal; Notable for the following components:   Sodium 133 (*)    Glucose, Bld 106 (*)    Calcium 8.6 (*)    Albumin 2.8 (*)    Total Bilirubin 0.2 (*)    All other components within normal limits  CBC - Abnormal; Notable for the following components:   WBC 11.8 (*)    RBC 3.74 (*)    Hemoglobin 10.4 (*)    HCT 33.5 (*)    RDW 16.8 (*)    All other components within normal limits  URINALYSIS, ROUTINE W REFLEX MICROSCOPIC    EKG None  Radiology CT ABDOMEN PELVIS W CONTRAST  Result Date: 03/09/2022 CLINICAL DATA:  Abdominal pain, left lower quadrant pain EXAM: CT ABDOMEN AND PELVIS WITH CONTRAST TECHNIQUE: Multidetector CT imaging of the abdomen and pelvis was performed using the standard protocol following bolus administration of  intravenous contrast. RADIATION DOSE REDUCTION: This exam was performed according to the departmental dose-optimization program which includes automated exposure control, adjustment of the mA and/or kV according to patient size and/or use of iterative reconstruction technique. CONTRAST:  30m OMNIPAQUE IOHEXOL 350 MG/ML SOLN COMPARISON:  01/29/2022 FINDINGS: Lower chest: No acute abnormality. Hepatobiliary: No  focal liver abnormality is seen. Decompressed gallbladder not well visualized. Pancreas: Unremarkable. No pancreatic ductal dilatation or surrounding inflammatory changes. Spleen: Normal in size without focal abnormality. Adrenals/Urinary Tract: Adrenal glands are unremarkable. Kidneys are normal, without renal calculi, focal lesion, or hydronephrosis. Bladder is unremarkable. Stomach/Bowel: Stomach is within normal limits. No evidence of bowel wall thickening, distention, or inflammatory changes. Appendix is normal. Vascular/Lymphatic: Prior abdominal bi-iliac stent graft in satisfactory position. Native aneurysm sac measures 4.5 cm in greatest transverse diameter unchanged from 01/29/2022. No enlarged abdominal or pelvic lymph nodes. Reproductive: Enlarged prostate gland measuring 4.4 x 4.9 x 6.4 cm. Other: No abdominal wall hernia or abnormality. No abdominopelvic ascites. Musculoskeletal: No acute osseous abnormality. No aggressive osseous lesion. Degenerative disease with disc height loss at L5-S1 with bilateral facet arthropathy. IMPRESSION: 1. No acute abdominal or pelvic pathology. 2. Prior abdominal bi-iliac stent graft in satisfactory position. Native aneurysm sac measures 4.5 cm in greatest transverse diameter unchanged from 01/29/2022. 3. Enlarged prostate gland. Electronically Signed   By: Kathreen Devoid M.D.   On: 03/09/2022 10:37    Procedures Procedures    Medications Ordered in ED Medications  oxyCODONE-acetaminophen (PERCOCET/ROXICET) 5-325 MG per tablet 1 tablet (1 tablet Oral Given  03/09/22 0926)  iohexol (OMNIPAQUE) 350 MG/ML injection 75 mL (75 mLs Intravenous Contrast Given 03/09/22 1031)    ED Course/ Medical Decision Making/ A&P                           Medical Decision Making Amount and/or Complexity of Data Reviewed Labs: ordered. Radiology: ordered.  Risk Prescription drug management.   Patient with left groin/testicular pain.  Began acutely.  Improved somewhat now.  Lab work overall reassuring.  White count mildly elevated.  Hemoglobin 10.4.  Both are not far off his baseline.  Urinalysis still pending.  However CT scan done and reassuring.  No clear cause of the pain.  However does have left testicular tenderness.  No hernia seen in groin.  Will get Doppler and ultrasound to evaluate for testicular pathology.  Care will be turned over to Dr. Doren Custard        Final Clinical Impression(s) / ED Diagnoses Final diagnoses:  Pain in left testicle    Rx / DC Orders ED Discharge Orders     None         Davonna Belling, MD 03/09/22 1537

## 2022-03-09 NOTE — ED Notes (Signed)
Pt's wife stated, "Ma'am are you the one that collected his urine sample" I told her yes. Pt's wife responded, "Did you put a label on there?" I told the pt's wife yes. Pts wife stated, "They said that they are waiting for the urine to be collected. We have been here all day." I told the pt's wife that I put a label on the urine and sent it down to the lab.   Pt's wife then came up to the nurses station waving pts label in my face stating, "Did you put one of these labels on the cup?" I told her yes. The pt's wife stated, "Where did you get one of them, I didn't give you one." I told pts wife that I printed another set of labels and placed them on the cup before being sent to the lab.

## 2022-03-09 NOTE — ED Notes (Signed)
Pt notified on needing urine sample.

## 2022-03-09 NOTE — ED Provider Triage Note (Signed)
Emergency Medicine Provider Triage Evaluation Note  Erik [name]. , a 81 y.o. male  was evaluated in triage.  Pt complains of LLQ pain onset 6 AM this morning. Denies nausea, vomiting, diarrhea, chest pain, shortness of breath. Last BM was today and normal per patient. No history of diverticulitis.  Review of Systems  Positive:  Negative:   Physical Exam  BP (!) 124/56 (BP Location: Right Arm)   Pulse 73   Temp 97.8 F (36.6 C) (Oral)   Resp (!) 22   Ht '5\' 6"'$  (1.676 m)   Wt 52 kg   SpO2 99%   BMI 18.50 kg/m  Gen:   Awake, no distress   Resp:  Normal effort  MSK:   Moves extremities without difficulty  Other:  Mild TTP to LLQ  Medical Decision Making  Medically screening exam initiated at 9:13 AM.  Appropriate orders placed.  Erik [name]. was informed that the remainder of the evaluation will be completed by another provider, this initial triage assessment does not replace that evaluation, and the importance of remaining in the ED until their evaluation is complete.     Whitaker Holderman A, PA-C 03/09/22 [phone]

## 2022-03-10 DIAGNOSIS — R040 Epistaxis: Secondary | ICD-10-CM | POA: Diagnosis not present

## 2022-03-10 DIAGNOSIS — I5022 Chronic systolic (congestive) heart failure: Secondary | ICD-10-CM | POA: Diagnosis not present

## 2022-03-10 DIAGNOSIS — D649 Anemia, unspecified: Secondary | ICD-10-CM | POA: Diagnosis not present

## 2022-03-10 DIAGNOSIS — Z452 Encounter for adjustment and management of vascular access device: Secondary | ICD-10-CM | POA: Diagnosis not present

## 2022-03-10 DIAGNOSIS — R7881 Bacteremia: Secondary | ICD-10-CM | POA: Diagnosis not present

## 2022-03-10 DIAGNOSIS — I110 Hypertensive heart disease with heart failure: Secondary | ICD-10-CM | POA: Diagnosis not present

## 2022-03-18 ENCOUNTER — Other Ambulatory Visit: Payer: Self-pay

## 2022-03-18 ENCOUNTER — Encounter: Payer: Self-pay | Admitting: Internal Medicine

## 2022-03-18 ENCOUNTER — Ambulatory Visit (INDEPENDENT_AMBULATORY_CARE_PROVIDER_SITE_OTHER): Payer: Medicare Other | Admitting: Internal Medicine

## 2022-03-18 VITALS — BP 120/61 | HR 70 | Resp 15 | Ht 66.0 in | Wt 110.0 lb

## 2022-03-18 DIAGNOSIS — D649 Anemia, unspecified: Secondary | ICD-10-CM | POA: Diagnosis not present

## 2022-03-18 DIAGNOSIS — B9561 Methicillin susceptible Staphylococcus aureus infection as the cause of diseases classified elsewhere: Secondary | ICD-10-CM | POA: Diagnosis not present

## 2022-03-18 DIAGNOSIS — R7881 Bacteremia: Secondary | ICD-10-CM

## 2022-03-18 DIAGNOSIS — Z452 Encounter for adjustment and management of vascular access device: Secondary | ICD-10-CM | POA: Diagnosis not present

## 2022-03-18 DIAGNOSIS — R634 Abnormal weight loss: Secondary | ICD-10-CM | POA: Diagnosis not present

## 2022-03-18 DIAGNOSIS — I2510 Atherosclerotic heart disease of native coronary artery without angina pectoris: Secondary | ICD-10-CM

## 2022-03-18 DIAGNOSIS — I5022 Chronic systolic (congestive) heart failure: Secondary | ICD-10-CM | POA: Diagnosis not present

## 2022-03-18 DIAGNOSIS — R040 Epistaxis: Secondary | ICD-10-CM | POA: Diagnosis not present

## 2022-03-18 DIAGNOSIS — I110 Hypertensive heart disease with heart failure: Secondary | ICD-10-CM | POA: Diagnosis not present

## 2022-03-18 NOTE — Progress Notes (Signed)
RFV: hospital follow up for mssa bacteremia  Patient ID: Erik [name]., male   DOB: August 31, 1940, 82 y.o.   MRN: [id number]  HPI He was hospitalized in early-mid November for MSSA bacteremia where we ruled out endocarditis. Dr Si Raider Reached out to radiology and felt no signs of infection for aortic graft. He was treated for complicated bacteremia with  4 wk and presents today for follow up on hospitalization. Doing well. Outpatient Encounter Medications as of 03/18/2022  Medication Sig   acetaminophen (TYLENOL) 500 MG tablet Take 1,000 mg by mouth at bedtime.   aspirin EC 81 MG tablet Take 81 mg by mouth every evening.    clopidogrel (PLAVIX) 75 MG tablet TAKE 1 TABLET BY MOUTH EVERY DAY   cyanocobalamin (VITAMIN B12) 1000 MCG tablet Take 1,000 mcg by mouth daily.   dapagliflozin propanediol (FARXIGA) 10 MG TABS tablet Take 1 tablet (10 mg total) by mouth daily before breakfast.   ezetimibe-simvastatin (VYTORIN) 10-40 MG tablet TAKE 1 TABLET BY MOUTH EVERYDAY AT BEDTIME   famotidine (PEPCID) 40 MG tablet Take 40 mg by mouth 2 (two) times daily.   ferrous sulfate 325 (65 FE) MG tablet Take 325 mg by mouth daily with breakfast.   furosemide (LASIX) 20 MG tablet Take 1 tablet (20 mg total) by mouth daily.   methocarbamol (ROBAXIN) 500 MG tablet TAKE 1 TABLET (500 MG TOTAL) BY MOUTH EVERY DAY AT BEDTIME AS NEEDED FOR MUSCLE SPASM   metoprolol succinate (TOPROL-XL) 50 MG 24 hr tablet TAKE 1 TABLET BY MOUTH EVERY DAY WITH OR IMMEDIATELY FOLLOWING MEAL (NEEDS OFFICE VISIT)   mirtazapine (REMERON) 15 MG tablet Take 15 mg by mouth at bedtime.   nitroGLYCERIN (NITROSTAT) 0.4 MG SL tablet Place 1 tablet (0.4 mg total) under the tongue every 5 (five) minutes x 3 doses as needed for chest pain.   Omega-3 Fatty Acids (FISH OIL) 1000 MG CAPS Take 1 capsule by mouth daily.   oxymetazoline (AFRIN) 0.05 % nasal spray Place 2 sprays into both nostrils 2 (two) times daily as needed (For nose bleeding).    rOPINIRole (REQUIP) 3 MG tablet TAKE 1/2 TABLET BY MOUTH IN THE MORNING & TAKE 1 TABLET AT NIGHT   sodium chloride (OCEAN) 0.65 % SOLN nasal spray Place 2 sprays into both nostrils every hour as needed (dryness).   spironolactone (ALDACTONE) 25 MG tablet TAKE 1/2 TABLET BY MOUTH EVERY DAY   tamsulosin (FLOMAX) 0.4 MG CAPS capsule TAKE 1 CAPSULE BY MOUTH EVERY DAY   No facility-administered encounter medications on file as of 03/18/2022.     Patient Active Problem List   Diagnosis Date Noted   Epistaxis 02/16/2022   Symptomatic anemia 02/16/2022   Hyponatremia 02/16/2022   MSSA bacteremia 02/03/2022   Protein-calorie malnutrition, severe 02/01/2022   Iron deficiency anemia secondary to inadequate dietary iron intake 11/23/2021   Family history of B12 deficiency 11/23/2021   Decreased appetite 10/11/2021   Epigastric pain 10/11/2021   Weight loss 07/14/2021   Hydrocele in adult 09/04/2020   Epididymitis 09/04/2020   Bronchiectasis ([place]) 10/24/2019   Restless leg syndrome 05/30/2018   Abdominal aortic aneurysm (AAA) ([place]) 07/07/2015   Arteriosclerosis of coronary artery 07/07/2015   Cardiomyopathy, ischemic 07/07/2015   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   Solitary pulmonary nodule 04/16/2014   Counseling regarding end of life decision making 04/16/2014   HTN (hypertension) 10/03/2013   CAD S/P percutaneous coronary angioplasty [id number]   Chronic systolic heart failure ([place]) 09/19/2013  NSTEMI (non-ST elevated myocardial infarction) ([place]) 09/10/2013   Family history of coronary artery disease in brother 09/10/2013   ED (erectile dysfunction) 12/10/2011   COPD, mild ([place]) 06/20/2008   COLONIC POLYPS, ADENOMATOUS 06/05/2008   BPH (benign prostatic hyperplasia) 06/05/2008   Prediabetes 05/14/2008   VENEREAL WART 05/07/2008   ONYCHOMYCOSIS, TOENAILS 05/07/2008   Hyperlipidemia 05/07/2008   Bilateral carotid artery stenosis 05/07/2008   INTERMITTENT  CLAUDICATION 05/07/2008   HEART MURMUR, HX OF 05/07/2008   COLONIC POLYPS, HX OF 05/07/2008     Health Maintenance Due  Topic Date Due   Zoster Vaccines- Shingrix (1 of 2) Never done   DTaP/Tdap/Td (2 - Tdap) 06/06/2018   COVID-19 Vaccine (4 - 2023-24 season) 11/13/2021     Review of Systems 12 point ros is negative except what is mentioned above Physical Exam  BP 120/61   Pulse 70   Resp 15   Ht '5\' 6"'$  (1.676 m)   Wt 110 lb (49.9 kg)   SpO2 99%   BMI 17.75 kg/m  Physical Exam  Constitutional: He is oriented to person, place, and time. He appears well-developed and well-nourished. No distress.  HENT:  Mouth/Throat: Oropharynx is clear and moist. No oropharyngeal exudate.  Cardiovascular: Normal rate, regular rhythm and normal heart sounds. Exam reveals no gallop and no friction rub.  No murmur heard.  Pulmonary/Chest: Effort normal and breath sounds normal. No respiratory distress. He has no wheezes.  Abdominal: Soft. Bowel sounds are normal. He exhibits no distension. There is no tenderness.  Lymphadenopathy:  He has no cervical adenopathy.  Neurological: He is alert and oriented to person, place, and time.  Skin: Skin is warm and dry. No rash noted. No erythema.  Psychiatric: He has a normal mood and affect. His behavior is normal.    CBC Lab Results  Component Value Date   WBC 11.8 (H) 03/09/2022   RBC 3.74 (L) 03/09/2022   HGB 10.4 (L) 03/09/2022   HCT 33.5 (L) 03/09/2022   PLT 304 03/09/2022   MCV 89.6 03/09/2022   MCH 27.8 03/09/2022   MCHC 31.0 03/09/2022   RDW 16.8 (H) 03/09/2022   LYMPHSABS 0.9 02/23/2022   MONOABS 0.9 02/23/2022   EOSABS 0.6 02/23/2022    BMET Lab Results  Component Value Date   NA 133 (L) 03/09/2022   K 4.4 03/09/2022   CL 102 03/09/2022   CO2 25 03/09/2022   GLUCOSE 106 (H) 03/09/2022   BUN 18 03/09/2022   CREATININE 1.15 03/09/2022   CALCIUM 8.6 (L) 03/09/2022   GFRNONAA >60 03/09/2022   GFRAA 95 08/18/2018       Assessment and Plan Overall still trying to increase weight back up from loss of weight over the last 28month. Doing well since we last saw him. Has completed treatment for mssa bacteremia Rtc on PRN basis

## 2022-03-26 DIAGNOSIS — I5022 Chronic systolic (congestive) heart failure: Secondary | ICD-10-CM | POA: Diagnosis not present

## 2022-03-26 DIAGNOSIS — I110 Hypertensive heart disease with heart failure: Secondary | ICD-10-CM | POA: Diagnosis not present

## 2022-03-26 DIAGNOSIS — R040 Epistaxis: Secondary | ICD-10-CM | POA: Diagnosis not present

## 2022-03-26 DIAGNOSIS — D649 Anemia, unspecified: Secondary | ICD-10-CM | POA: Diagnosis not present

## 2022-03-26 DIAGNOSIS — Z452 Encounter for adjustment and management of vascular access device: Secondary | ICD-10-CM | POA: Diagnosis not present

## 2022-03-26 DIAGNOSIS — R7881 Bacteremia: Secondary | ICD-10-CM | POA: Diagnosis not present

## 2022-03-31 ENCOUNTER — Ambulatory Visit (HOSPITAL_COMMUNITY)
Admission: RE | Admit: 2022-03-31 | Discharge: 2022-03-31 | Disposition: A | Discharge status: Home / Self Care | Payer: Medicare Other | Source: Ambulatory Visit | Attending: Adult Health | Admitting: Adult Health

## 2022-03-31 VITALS — BP 120/60 | HR 69 | Wt 109.0 lb

## 2022-03-31 DIAGNOSIS — I2510 Atherosclerotic heart disease of native coronary artery without angina pectoris: Secondary | ICD-10-CM | POA: Diagnosis not present

## 2022-03-31 DIAGNOSIS — Z79899 Other long term (current) drug therapy: Secondary | ICD-10-CM | POA: Diagnosis not present

## 2022-03-31 DIAGNOSIS — F17210 Nicotine dependence, cigarettes, uncomplicated: Secondary | ICD-10-CM | POA: Diagnosis not present

## 2022-03-31 DIAGNOSIS — Z7982 Long term (current) use of aspirin: Secondary | ICD-10-CM | POA: Diagnosis not present

## 2022-03-31 DIAGNOSIS — I447 Left bundle-branch block, unspecified: Secondary | ICD-10-CM | POA: Diagnosis not present

## 2022-03-31 DIAGNOSIS — Z955 Presence of coronary angioplasty implant and graft: Secondary | ICD-10-CM | POA: Diagnosis not present

## 2022-03-31 DIAGNOSIS — I110 Hypertensive heart disease with heart failure: Secondary | ICD-10-CM | POA: Diagnosis not present

## 2022-03-31 DIAGNOSIS — E785 Hyperlipidemia, unspecified: Secondary | ICD-10-CM | POA: Diagnosis not present

## 2022-03-31 DIAGNOSIS — I5022 Chronic systolic (congestive) heart failure: Secondary | ICD-10-CM | POA: Diagnosis not present

## 2022-03-31 DIAGNOSIS — I252 Old myocardial infarction: Secondary | ICD-10-CM | POA: Insufficient documentation

## 2022-03-31 DIAGNOSIS — E7849 Other hyperlipidemia: Secondary | ICD-10-CM | POA: Diagnosis not present

## 2022-03-31 DIAGNOSIS — Z7902 Long term (current) use of antithrombotics/antiplatelets: Secondary | ICD-10-CM | POA: Diagnosis not present

## 2022-03-31 DIAGNOSIS — I255 Ischemic cardiomyopathy: Secondary | ICD-10-CM | POA: Diagnosis not present

## 2022-03-31 DIAGNOSIS — J449 Chronic obstructive pulmonary disease, unspecified: Secondary | ICD-10-CM | POA: Diagnosis not present

## 2022-03-31 NOTE — Addendum Note (Signed)
Encounter addended by: Payton Mccallum, RN on: 03/31/2022 12:32 PM  Actions taken: Clinical Note Signed

## 2022-03-31 NOTE — Patient Instructions (Addendum)
Thank you for coming in today    Ok to switch Spironolactone to the morning.   Dont take spironolactone today. Tomorrow start spironolactone 12.5 mg in the morning.    Your physician recommends that you schedule a follow-up appointment in:  Follow up with Dr Aundra Dubin in 3-4 months.  You will receive a reminder letter in the mail a few months in advance. If you don't receive a letter, please call our office to schedule the follow-up appointment.    Do the following things EVERYDAY: Weigh yourself in the morning before breakfast. Write it down and keep it in a log. Take your medicines as prescribed Eat low salt foods--Limit salt (sodium) to 2000 mg per day.  Stay as active as you can everyday Limit all fluids for the day to less than 2 liters  At the [facility], you and your health needs are our priority. As part of our continuing mission to provide you with exceptional heart care, we have created designated Provider Care Teams. These Care Teams include your primary Cardiologist (physician) and Advanced Practice Providers (APPs- Physician Assistants and Nurse Practitioners) who all work together to provide you with the care you need, when you need it.   You may see any of the following providers on your designated Care Team at your next follow up: Dr Glori Bickers Dr [name] Dr. Roxana Hires, NP Lyda Jester, [place] [facility] [place], [place] Forestine Na, NP Audry Riles, PharmD   Please be sure to bring in all your medications bottles to every appointment.   .call

## 2022-03-31 NOTE — Progress Notes (Signed)
Patient ID: Erik Scarlet., male   DOB: [birth year], 82 y.o.   MRN: [id number] PCP: Dr [name] Vascular: Dr Scot Dock Cardiology: Dr Aundra Dubin   HPI: Mr Erik [name] is a 82 y.o. with a history of COPD, HTN , PAD with bilateral CEAs, ICM, CAD s/p PCI of proximal LAD and proximal RCA (06/1935), chronic systolic HF and tobacco abuse.  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 Sammuel Hines. Also had left renal artery stenting. Now followed at [facility].   Admitted with dyspnea in 08/2013. Found to have 2v CAD and EF 20-25%, which was a new finding. He wore a Lifevest. EF recovered on 05/2015 echo to 55-60%.   Echo was done in 5/20, showing EF 45-50% with septal hypokinesis, mild LVH, moderate AI.  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  Echo in 5/22 showed EF 40-45%, mild LVH, RV normal, moderate MR, moderate AI.  Admitted 01/2022 with septic shock secondary to MSSA bacteremia. Had ECHO 01/2022 EF 50-55%. No endocarditis.   Admitted 02/15/22 with recurrent epistaxis and symptomatic anemia. Had Rhino Rocket placed and given 2 units PRBCs. Aspirin and placix held for a couple of days.   Today he returns for HF follow up.Overall feeling fine. SOB when he walks fast. Smokes 1 PPD.  Denies PND/Orthopnea. No chest pain.  Appetite ok. No fever or chills. Weight at home  trending down to 108 pounds. Taking all medications.   Labs 03/2016, K 3.8, creatinine 1.00 Labs 09/13/13 K 4.1 Creatinine 0.95  Labs 10/15/13 K 4.1 Cr 0.7 => 1.07 Labs 2/19: K 4.2, creatinine 0.98, LDL 49, HDL 43 Labs 5/20: K 4.2, creatinine 0.99, LDL 44, hgb 12.3 Labs 3/21: LDL 48, K 4.1, creatinine 0.91 Labs 4/22 LDL 37, K 4.2, creatinine 1.18 Labs 4/23: LDL 44 Labs 7/23: K 4.8, creatinine 1.11 Labs 03/09/22: K 4.4 Creatinine 1.15 Hgb 10.4   ROS: All systems negative except as listed in HPI, PMH and Problem List.  Family Status  Relation Name Status   Brother   Deceased   Mother  Alive       HTN, Born 71   Father  Deceased       brain tumor   Brother  Alive       HLD, Heart disease   Brother  Alive       HLD, CAD   Brother  Alive       HLD, MS   Neg Hx  (Not Specified)    Social History   Socioeconomic History   Marital status: Married    Spouse name: Not on file   Number of children: Not on file   Years of education: Not on file   Highest education level: Not on file  Occupational History   Occupation: Retired Development worker, community  Tobacco Use   Smoking status: Every Day    Packs/day: 1.00    Years: 50.00    Total pack years: 50.00    Types: Cigarettes   Smokeless tobacco: Current   Tobacco comments:    off and on since age 75, currently smokes a pack a day [facility] 12/23/20  Vaping Use   Vaping Use: Never used  Substance and Sexual Activity   Alcohol use: No   Drug use: No   Sexual activity: Yes  Other Topics Concern   Not on file  Social History Narrative   Lives with wife in [place]. Retired from  the post office   Social Determinants of Health   Financial Resource Strain: Low Risk  (04/29/2021)   Overall Financial Resource Strain (CARDIA)    Difficulty of Paying Living Expenses: Not very hard  Food Insecurity: No Food Insecurity (02/22/2022)   Hunger Vital Sign    Worried About Running Out of Food in the Last Year: Never true    Ran Out of Food in the Last Year: Never true  Transportation Needs: No Transportation Needs (02/22/2022)   PRAPARE - Hydrologist (Medical): No    Lack of Transportation (Non-Medical): No  Physical Activity: Inactive (05/31/2019)   Exercise Vital Sign    Days of Exercise per Week: 0 days    Minutes of Exercise per Session: 0 min  Stress: No Stress Concern Present (05/31/2019)   [place]    Feeling of Stress : Not at all  Social Connections: Not on file  Intimate Partner Violence: Not At Risk (02/17/2022)    Humiliation, Afraid, Rape, and Kick questionnaire    Fear of Current or Ex-Partner: No    Emotionally Abused: No    Physically Abused: No    Sexually Abused: No    Past Medical History:  Diagnosis Date   Adenomatous colon polyp    Aortic aneurysm (HCC)    BPH (benign prostatic hypertrophy)    CAD (coronary artery disease)    Carotid artery stenosis    a. Bilateral CEA   Cataract    CHF (congestive heart failure) (HCC)    Chronic systolic heart failure (HCC)    a. EF 20-25%, mild LVH, mod HK, mid apicalanteroseptal myocardium, mild MR, LA mod dilated   Collagen vascular disease (HCC)    COPD (chronic obstructive pulmonary disease) (HCC)    Coronary artery disease    a. LHC (08/2013): Lmain: short 30% distal, LAD: sml D1 & D2, 70% ostial D1, 95-99% LAD stenosis prox D2 LCx: sml/mod ramus subtot. occluded, 40% ostial set off lg OM1, 40% AV LCx after OM1, RCA: 90% prox (DES to RCA and prox LAD)   Diverticulosis    GERD (gastroesophageal reflux disease)    Heart murmur    History of colonic polyps    Hyperlipidemia    Hyperplastic colon polyp    Hypertension    Ischemic cardiomyopathy    RLS (restless legs syndrome)     Current Outpatient Medications  Medication Sig Dispense Refill   acetaminophen (TYLENOL) 500 MG tablet Take 1,000 mg by mouth at bedtime.     aspirin EC 81 MG tablet Take 81 mg by mouth every evening.      clopidogrel (PLAVIX) 75 MG tablet TAKE 1 TABLET BY MOUTH EVERY DAY 90 tablet 3   cyanocobalamin (VITAMIN B12) 1000 MCG tablet Take 1,000 mcg by mouth daily.     dapagliflozin propanediol (FARXIGA) 10 MG TABS tablet Take 1 tablet (10 mg total) by mouth daily before breakfast. 30 tablet 11   ezetimibe-simvastatin (VYTORIN) 10-40 MG tablet TAKE 1 TABLET BY MOUTH EVERYDAY AT BEDTIME 90 tablet 3   famotidine (PEPCID) 40 MG tablet Take 40 mg by mouth 2 (two) times daily.     ferrous sulfate 325 (65 FE) MG tablet Take 325 mg by mouth daily with breakfast.      furosemide (LASIX) 20 MG tablet Take 1 tablet (20 mg total) by mouth daily. 30 tablet    methocarbamol (ROBAXIN) 500 MG tablet TAKE 1 TABLET (500 MG  TOTAL) BY MOUTH EVERY DAY AT BEDTIME AS NEEDED FOR MUSCLE SPASM 30 tablet 0   metoprolol succinate (TOPROL-XL) 50 MG 24 hr tablet TAKE 1 TABLET BY MOUTH EVERY DAY WITH OR IMMEDIATELY FOLLOWING MEAL (NEEDS OFFICE VISIT) 90 tablet 0   mirtazapine (REMERON) 15 MG tablet Take 15 mg by mouth at bedtime.     nitroGLYCERIN (NITROSTAT) 0.4 MG SL tablet Place 1 tablet (0.4 mg total) under the tongue every 5 (five) minutes x 3 doses as needed for chest pain. 30 tablet 3   Omega-3 Fatty Acids (FISH OIL) 1000 MG CAPS Take 1 capsule by mouth daily.     oxymetazoline (AFRIN) 0.05 % nasal spray Place 2 sprays into both nostrils 2 (two) times daily as needed (For nose bleeding). 15 mL 2   rOPINIRole (REQUIP) 3 MG tablet TAKE 1/2 TABLET BY MOUTH IN THE MORNING & TAKE 1 TABLET AT NIGHT 135 tablet 1   sodium chloride (OCEAN) 0.65 % SOLN nasal spray Place 2 sprays into both nostrils every hour as needed (dryness). 88 mL 2   spironolactone (ALDACTONE) 25 MG tablet TAKE 1/2 TABLET BY MOUTH EVERY DAY 45 tablet 3   tamsulosin (FLOMAX) 0.4 MG CAPS capsule TAKE 1 CAPSULE BY MOUTH EVERY DAY 90 capsule 3   No current facility-administered medications for this encounter.    Vitals:   03/31/22 1102  BP: 120/60  Pulse: 69  SpO2: 100%  Weight: 49.4 kg (109 lb)   Wt Readings from Last 3 Encounters:  03/31/22 49.4 kg (109 lb)  03/18/22 49.9 kg (110 lb)  03/09/22 52 kg (114 lb 10.2 oz)     PHYSICAL EXAM: General:  Thin. Walked in the clinic. No resp difficulty HEENT: normal Neck: supple. no JVD. Carotids 2+ bilat; no bruits. No lymphadenopathy or thryomegaly appreciated. Cor: PMI nondisplaced. Regular rate & rhythm. No rubs, gallops or murmurs. Lungs: clear Abdomen: soft, nontender, nondistended. No hepatosplenomegaly. No bruits or masses. Good bowel  sounds. Extremities: no cyanosis, clubbing, rash, edema Neuro: alert & orientedx3, cranial nerves grossly intact. moves all 4 extremities w/o difficulty. Affect pleasant  EKG: 70 bpm   ASSESSMENT & PLAN:  1. Chronic Systolic Heart Failure: Ischemic cardiomyopathy, EF 20-25% (08/2013), EF recovered on 2017 echo with EF 55-60%.  Most recent echo in 5/20 with EF 45-50%, septal hypokinesis, moderate AI.  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  Echo in 5/22 with EF 40-45%.  01/2022 Echo  EF 50% Chronic LBBB.  NYHA III. Volume status stable.  - Farxiga 10 mg daily. - Continue Entresto 24/26 bid.   - Continue Toprol XL 25 mg daily at bedtime.   - Continue spironolactone 12.5 mg at bed time  - Recent blood work ok. BMET stable.   2. CAD: LHC 09/11/13 with 2 vessel disease that required PCI of proximal LAD and proximal RCA.  Cardiolite in 6/20 with no ischemia but fixed apical septal defect.  No recent chest pain.  - Continue Plavix + ASA. Will be on Plavix indefinitely.  - Continue Vytorin, good lipids in 4/23.  - No chest pain.  3. COPD: Heavy smoker. Not interested in quiting.   4. H/O of bilateral CEAs: Followed by [facility]. - Continue ASA and Plavix.  - Carotid dopplers will be done at [facility].  5. History of aortobiiliac endograft with Dr. Sammuel Hines at [facility]. Follows for PAD with [facility] now. No claudication.  - Sees [facility] regularly.  6. Hyperlipidemia: Good lipids in 4/23 on  Vytorin. 7. H/O MSSA Bacteremia No endocarditis on TEE.    Follow up with Dr Aundra Dubin in 3-4 months.   Belen Zwahlen NP-C  03/31/2022

## 2022-04-02 DIAGNOSIS — Z452 Encounter for adjustment and management of vascular access device: Secondary | ICD-10-CM | POA: Diagnosis not present

## 2022-04-02 DIAGNOSIS — I110 Hypertensive heart disease with heart failure: Secondary | ICD-10-CM | POA: Diagnosis not present

## 2022-04-02 DIAGNOSIS — D649 Anemia, unspecified: Secondary | ICD-10-CM | POA: Diagnosis not present

## 2022-04-02 DIAGNOSIS — R7881 Bacteremia: Secondary | ICD-10-CM | POA: Diagnosis not present

## 2022-04-02 DIAGNOSIS — R040 Epistaxis: Secondary | ICD-10-CM | POA: Diagnosis not present

## 2022-04-02 DIAGNOSIS — I5022 Chronic systolic (congestive) heart failure: Secondary | ICD-10-CM | POA: Diagnosis not present

## 2022-04-05 NOTE — Assessment & Plan Note (Signed)
Secondary to epistaxis.  Reevaluate today.

## 2022-04-05 NOTE — Assessment & Plan Note (Signed)
Stable, chronic.  Continue current medication.   Metoprolol XL 50 mg p.o. daily, Lasix 20 mg daily, Entresto 24/26 mg 2 times daily, spironolactone half tablet p.o. daily, lisinopril 5 mg p.o. daily

## 2022-04-05 NOTE — Assessment & Plan Note (Addendum)
Acute, no further episodes. Has scheduled follow-up with ENT  Now restarted on aspirin and Plavix at previous doses

## 2022-04-05 NOTE — Assessment & Plan Note (Signed)
Acute, now resolved per repeat blood cultures negative. Ancef to end date 02/28/2022. Reevaluate basic metabolic panel today

## 2022-04-05 NOTE — Assessment & Plan Note (Signed)
Acute on chronic Improved back on furosemide.  Euvolemic in office today. Has scheduled follow-up with cardiology

## 2022-04-05 NOTE — Assessment & Plan Note (Signed)
Acute, due for reevaluation

## 2022-04-08 ENCOUNTER — Telehealth: Payer: Self-pay

## 2022-04-08 NOTE — Progress Notes (Signed)
Care Management & Coordination Services Pharmacy Team  Reason for Encounter: Appointment Reminder  Contacted patient to confirm telephone appointment with Charlene Brooke,  PharmD, on 04/13/2022 at 9:30.  Unsuccessful outreach. Left voicemail for patient to return call.   Medications: Outpatient Encounter Medications as of 04/08/2022  Medication Sig Note   acetaminophen (TYLENOL) 500 MG tablet Take 1,000 mg by mouth at bedtime.    aspirin EC 81 MG tablet Take 81 mg by mouth every evening.     clopidogrel (PLAVIX) 75 MG tablet TAKE 1 TABLET BY MOUTH EVERY DAY    cyanocobalamin (VITAMIN B12) 1000 MCG tablet Take 1,000 mcg by mouth daily.    dapagliflozin propanediol (FARXIGA) 10 MG TABS tablet Take 1 tablet (10 mg total) by mouth daily before breakfast.    ezetimibe-simvastatin (VYTORIN) 10-40 MG tablet TAKE 1 TABLET BY MOUTH EVERYDAY AT BEDTIME    famotidine (PEPCID) 40 MG tablet Take 40 mg by mouth 2 (two) times daily.    ferrous sulfate 325 (65 FE) MG tablet Take 325 mg by mouth daily with breakfast.    furosemide (LASIX) 20 MG tablet Take 1 tablet (20 mg total) by mouth daily.    methocarbamol (ROBAXIN) 500 MG tablet TAKE 1 TABLET (500 MG TOTAL) BY MOUTH EVERY DAY AT BEDTIME AS NEEDED FOR MUSCLE SPASM    metoprolol succinate (TOPROL-XL) 50 MG 24 hr tablet TAKE 1 TABLET BY MOUTH EVERY DAY WITH OR IMMEDIATELY FOLLOWING MEAL (NEEDS OFFICE VISIT)    mirtazapine (REMERON) 15 MG tablet Take 15 mg by mouth at bedtime.    nitroGLYCERIN (NITROSTAT) 0.4 MG SL tablet Place 1 tablet (0.4 mg total) under the tongue every 5 (five) minutes x 3 doses as needed for chest pain. 06/10/2020: .   Omega-3 Fatty Acids (FISH OIL) 1000 MG CAPS Take 1 capsule by mouth daily.    oxymetazoline (AFRIN) 0.05 % nasal spray Place 2 sprays into both nostrils 2 (two) times daily as needed (For nose bleeding).    rOPINIRole (REQUIP) 3 MG tablet TAKE 1/2 TABLET BY MOUTH IN THE MORNING & TAKE 1 TABLET AT NIGHT    sodium  chloride (OCEAN) 0.65 % SOLN nasal spray Place 2 sprays into both nostrils every hour as needed (dryness).    spironolactone (ALDACTONE) 25 MG tablet TAKE 1/2 TABLET BY MOUTH EVERY DAY    tamsulosin (FLOMAX) 0.4 MG CAPS capsule TAKE 1 CAPSULE BY MOUTH EVERY DAY    No facility-administered encounter medications on file as of 04/08/2022.   Lab Results  Component Value Date/Time   HGBA1C 6.0 07/07/2021 09:51 AM   HGBA1C 5.6 07/03/2020 09:06 AM    BP Readings from Last 3 Encounters:  03/31/22 120/60  03/18/22 120/61  03/09/22 (!) 154/80    Star Rating Drugs:  Medication:    Last Fill: Day Supply Ezetimibe-Simvastatin 10-40 mg 04/08/2022 90  Care Gaps: Annual wellness visit in last year? Yes 07/14/2021  Charlene Brooke, PharmD notified  Marijean Niemann, [place] [facility] Assistant [phone]

## 2022-04-09 ENCOUNTER — Telehealth: Payer: Medicare Other

## 2022-04-13 ENCOUNTER — Ambulatory Visit: Payer: Medicare Other | Admitting: Pharmacist

## 2022-04-13 NOTE — Progress Notes (Unsigned)
Care Management & Coordination Services Pharmacy Note  04/13/2022 Name:  Erik [name]. MRN:  [id number] DOB:  [birth year]  Summary: -HF: pt is not taking furosemide, he reports he was told to stop this months ago; per chart review furosemide stopped 11/27/21, but was restarted 02/06/22 ED visit for acute HF, I do not see where anyone advised him to stop it since then; he reports weight has been stable 107-108 lbs, he denies excess swelling; BP has been 120s/50s  Recommendations/Changes made from today's visit: -Advised to monitor daily weight and call cardiology with wt gain 2+ lbs overnight or 5+ lbs in a week  Follow up plan: -[place] will call patient 2 months for HF update -Pharmacist follow up televisit scheduled for 3 months -PCP appt 05/25/22    Subjective: Erik [name]. is an 82 y.o. year old male who is a primary patient of Bedsole, Amy E, MD.  The care coordination team was consulted for assistance with disease management and care coordination needs.    Engaged with patient by telephone for follow up visit.  Recent office visits: 02/23/22 Dr [name] OV: epistaxis - resolved. Labs stable. Entresto removed from med list.  11/03/21 Dr [name] OV: weight loss. No benefit from mirtazapine. C/o epigastric pain - unclear etiology. Advise f/u with GI.   Recent consult visits: 03/31/22 HF Clinic: switch spironolactone to AM.   03/18/22 Dr Baxter Flattery (ID): f/u MSSA bacteremia. Resolved. F/U PRN.  11/27/21 Dr Aundra Dubin (HF clinic): f/u HF. Start [place]. Change Toprol and Spironolactone to nightly.  Hospital visits: 03/09/22 ED visit [facility]): testicular pain - [place] w/ small hydrocele. Pain resolved w/ Percocet. Discharged.   02/15/22 - 12/923 admission [facility]) epistaxis, anemia. Held aspirin and plavix. Gave 2 u PRBC, HGB improved 6.8 to 8.6. Restart ASA and plavix 12/10.   02/05/22 ED visit [facility]): acute CHF (Lasix was not restarted after recent hospitalization in s/o  hypotension). Restart furosemide 20 mg daily, weigh daily.  01/29/22 - 02/03/22 Admission [facility]): MSSA bacteremia. IV cefazolin through 02/28/22. D/C Entresto.   Objective:  Lab Results  Component Value Date   CREATININE 1.15 03/09/2022   BUN 18 03/09/2022   GFR 60.66 02/23/2022   GFRNONAA >60 03/09/2022   GFRAA 95 08/18/2018   NA 133 (L) 03/09/2022   K 4.4 03/09/2022   CALCIUM 8.6 (L) 03/09/2022   CO2 25 03/09/2022   GLUCOSE 106 (H) 03/09/2022    Lab Results  Component Value Date/Time   HGBA1C 6.0 07/07/2021 09:51 AM   HGBA1C 5.6 07/03/2020 09:06 AM   GFR 60.66 02/23/2022 04:21 PM   GFR 81.19 07/07/2021 09:51 AM    Last diabetic Eye exam: No results found for: "HMDIABEYEEXA"  Last diabetic Foot exam: No results found for: "HMDIABFOOTEX"   Lab Results  Component Value Date   CHOL 113 07/07/2021   HDL 49.10 07/07/2021   LDLCALC 44 07/07/2021   LDLDIRECT 176.9 05/09/2008   TRIG 102.0 07/07/2021   CHOLHDL 2 07/07/2021       Latest Ref Rng & Units 03/09/2022    8:12 AM 02/23/2022    4:21 PM 02/15/2022   11:40 PM  Hepatic Function  Total Protein 6.5 - 8.1 g/dL 7.9  7.1  7.0   Albumin 3.5 - 5.0 g/dL 2.8  3.0  2.3   AST 15 - 41 U/L '19  16  23   '$ ALT 0 - 44 U/L '9  1  6   '$ Alk Phosphatase 38 - 126 U/L 55  67  58   Total Bilirubin 0.3 - 1.2 mg/dL 0.2  0.2  0.4     Lab Results  Component Value Date/Time   TSH 1.75 10/09/2021 04:46 PM   TSH 1.178 02/05/2015 05:10 PM       Latest Ref Rng & Units 03/09/2022    8:12 AM 02/23/2022    4:21 PM 02/19/2022    8:22 PM  CBC  WBC 4.0 - 10.5 K/uL 11.8  6.7    Hemoglobin 13.0 - 17.0 g/dL 10.4  10.4  9.0   Hematocrit 39.0 - 52.0 % 33.5  31.7  27.3   Platelets 150 - 400 K/uL 304  264.0      Lab Results  Component Value Date/Time   VITAMINB12 536 11/03/2021 11:54 AM    Clinical ASCVD: Yes  The ASCVD Risk score (Arnett DK, et al., 2019) failed to calculate for the following reasons:   The 2019 ASCVD risk score is only  valid for ages 80 to 65   The patient has a prior MI or stroke diagnosis        03/18/2022    2:34 PM 07/14/2021    9:44 AM 07/10/2020   11:03 AM  Depression screen PHQ 2/9  Decreased Interest 0 0 0  Down, Depressed, Hopeless 0 0 0  PHQ - 2 Score 0 0 0     Social History   Tobacco Use  Smoking Status Every Day   Packs/day: 1.00   Years: 50.00   Total pack years: 50.00   Types: Cigarettes  Smokeless Tobacco Current  Tobacco Comments   off and on since age 31, currently smokes a pack a day [facility] 12/23/20   BP Readings from Last 3 Encounters:  03/31/22 120/60  03/18/22 120/61  03/09/22 (!) 154/80   Pulse Readings from Last 3 Encounters:  03/31/22 69  03/18/22 70  03/09/22 65   Wt Readings from Last 3 Encounters:  03/31/22 109 lb (49.4 kg)  03/18/22 110 lb (49.9 kg)  03/09/22 114 lb 10.2 oz (52 kg)   BMI Readings from Last 3 Encounters:  03/31/22 17.59 kg/m  03/18/22 17.75 kg/m  03/09/22 18.50 kg/m    Allergies  Allergen Reactions   Codeine Anaphylaxis and Swelling    throat swells    Medications Reviewed Today     Reviewed by Charlton Haws, [facility] (Pharmacist) on 04/13/22 at 1004  Med List Status: <None>   Medication Order Taking? Sig Documenting Provider Last Dose Status Informant  acetaminophen (TYLENOL) 500 MG tablet [id number] Yes Take 1,000 mg by mouth at bedtime. [provider] Taking Active Self, Spouse/Significant Other, Pharmacy Records           Med Note Vinie Sill Oct 03, 2018 10:05 AM)    aspirin EC 81 MG tablet [id number] Yes Take 81 mg by mouth every evening.  [provider] Taking Active Self, Spouse/Significant Other, Pharmacy Records  clopidogrel (PLAVIX) 75 MG tablet [id number] Yes TAKE 1 TABLET BY MOUTH EVERY DAY Larey Dresser, MD Taking Active Self, Spouse/Significant Other, Pharmacy Records  cyanocobalamin (VITAMIN B12) 1000 MCG tablet [id number] Yes Take 1,000 mcg by mouth daily. [provider] Taking Active Self, Spouse/Significant Other, Pharmacy Records  dapagliflozin propanediol (FARXIGA) 10 MG TABS tablet [id number] Yes Take 1 tablet (10 mg total) by mouth daily before breakfast. Larey Dresser, MD Taking Active Self, Spouse/Significant Other, Pharmacy Records  ezetimibe-simvastatin (VYTORIN) 10-40 MG tablet [id number] Yes TAKE 1 TABLET BY  MOUTH EVERYDAY AT BEDTIME Bedsole, Amy E, MD Taking Active Self, Spouse/Significant Other, Pharmacy Records  famotidine (PEPCID) 40 MG tablet [id number] Yes Take 40 mg by mouth 2 (two) times daily. [provider] Taking Active   ferrous sulfate 325 (65 FE) MG tablet [id number] Yes Take 325 mg by mouth daily with breakfast. [provider] Taking Active Self, Spouse/Significant Other, Pharmacy Records  furosemide (LASIX) 20 MG tablet [id number] No Take 20 mg by mouth as needed for fluid or edema (2+ weight gain overnight or 5+ wt gain in a week).  Patient not taking: Reported on 04/13/2022   [provider] Not Taking Active            Med Note Angelica Ran Apr 13, 2022 10:04 AM) Has not taken since ~Nov 2023  methocarbamol (ROBAXIN) 500 MG tablet [id number] Yes TAKE 1 TABLET (500 MG TOTAL) BY MOUTH EVERY DAY AT BEDTIME AS NEEDED FOR MUSCLE SPASM Bedsole, Amy E, MD Taking Active   metoprolol succinate (TOPROL-XL) 50 MG 24 hr tablet [id number] Yes TAKE 1 TABLET BY MOUTH EVERY DAY WITH OR IMMEDIATELY FOLLOWING MEAL (NEEDS OFFICE VISIT) Larey Dresser, MD Taking Active Self, Spouse/Significant Other, Pharmacy Records           Med Note ([place] Feb 23, 2022  3:45 PM)    mirtazapine (REMERON) 15 MG tablet [id number] Yes Take 15 mg by mouth at bedtime. [provider] Taking Active Self, Spouse/Significant Other, Pharmacy Records  nitroGLYCERIN (NITROSTAT) 0.4 MG SL tablet [id number] Yes Place 1 tablet (0.4 mg total) under the tongue every 5 (five) minutes x 3 doses as needed for chest pain.  [name], MD Taking Active Self, Spouse/Significant Other, Pharmacy Records           Med Note [facility] [place]   Tue Jun 10, 2020  9:01 AM) .  Omega-3 Fatty Acids (FISH OIL) 1000 MG CAPS [id number] Yes Take 1 capsule by mouth daily. [provider] Taking Active Self, Spouse/Significant Other, Pharmacy Records  oxymetazoline (AFRIN) 0.05 % nasal spray [id number] Yes Place 2 sprays into both nostrils 2 (two) times daily as needed (For nose bleeding). Lorella Nimrod, MD Taking Active   rOPINIRole (REQUIP) 3 MG tablet [id number] Yes TAKE 1/2 TABLET BY MOUTH IN THE MORNING & TAKE 1 TABLET AT NIGHT Bedsole, Amy E, MD Taking Active   sodium chloride (OCEAN) 0.65 % SOLN nasal spray [id number] Yes Place 2 sprays into both nostrils every hour as needed (dryness). Lorella Nimrod, MD Taking Active   spironolactone (ALDACTONE) 25 MG tablet [id number] Yes TAKE 1/2 TABLET BY MOUTH EVERY DAY Larey Dresser, MD Taking Active Self, Spouse/Significant Other, Pharmacy Records  tamsulosin (FLOMAX) 0.4 MG CAPS capsule [id number] Yes TAKE 1 CAPSULE BY MOUTH EVERY DAY Bedsole, Amy E, MD Taking Active Self, Spouse/Significant Other, Pharmacy Records            SDOH:  (Social Determinants of Health) assessments and interventions performed: No SDOH Interventions    Flowsheet Row Telephone from 02/22/2022 in [place] Management from 04/29/2021 in [facility] at [place] from 05/31/2019 in [place] at [place] Interventions Intervention Not Indicated Intervention Not Indicated --  Housing Interventions Intervention Not Indicated -- --  Transportation Interventions Intervention Not Indicated -- --  Depression Interventions/Treatment  -- -- [id number] Score <  4 Follow-up Not Indicated  Financial Strain Interventions -- Intervention Not Indicated --        Medication Assistance:  -Pt is able to use $10 copay card for [facility]  Medication Access: Within the past 30 days, how often has patient missed a dose of medication? 0 Is a pillbox or other method used to improve adherence? Yes  Factors that may affect medication adherence? lack of understanding of disease management Are meds synced by current pharmacy? No  Are meds delivered by current pharmacy? No  Does patient experience delays in picking up medications due to transportation concerns? No   [facility] Services Reviewed: Is patient disadvantaged to use [facility]?: Yes  Current Rx insurance plan: [place] [address] [place] Name and location of Current pharmacy:  [id number] [place] [id number]: [phone]: [phone] [facility] services reviewed with patient today?: No  Patient requests to transfer care to [facility]?: No  Reason patient declined to change pharmacies: Disadvantaged due to insurance/mail order  Compliance/Adherence/Medication fill history: Care Gaps: None  Star-Rating Drugs: Ezetimibe-simvastatin - PDC   Assessment/Plan   Hyperlipidemia / CAD (LDL goal < 70) -Controlled - LDL 44 (06/2021) at goal; pt endorses compliance with medications as prescribed; he denies bleeding issues; he has never had to use NTG -Hx NSTEMI 2015, intermittent claudication, bilateral carotid artery stenosis -Current treatment: Ezetimibe-simvastatin 10-40 mg daily HS - Appropriate, Effective, Safe, Accessible Nitroglycerin 0.4 mg SL prn -Appropriate, Effective, Safe, Accessible Aspirin 81 mg daily -Appropriate, Effective, Safe, Accessible Clopidogrel 75 mg daily -Appropriate, Effective, Safe, Accessible OTC fish oil 1000 mg daily -Appropriate, Effective, Safe, Accessible -Current dietary patterns: "whatever my wife makes" -Current exercise habits: walking, yard work -Educated on [employer]  Benefits of statin for ASCVD risk reduction; benefits of antiplatelet therapy for maintaining stent patency -Recommended to continue current medication  Heart Failure / HTN (Goal: BP < 140/90, and prevent exacerbations) -Controlled-  pt has not been taking furosemide, he states he was told FWilder Gladereplaces furosemide; he reports wt is stable at home -Last ejection fraction: 50% (Date: 01/2022) (previously 20-25% in 08/2013) -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 -Current home BP/HR readings: 120/50 -Daily weight: 107# -Current treatment: Furosemide 20 mg daily  - not taking Metoprolol succinate 50 mg daily HS -Appropriate, Effective, Safe, Accessible Spironolactone 25 mg - 1/2 tab daily AM -Appropriate, Effective, Safe, Accessible Farxiga 10 mg daily - Appropriate, Effective, Safe, Accessible -Medications previously tried: lisinopril, Entresto -Educated on Benefits of medications for managing symptoms and prolonging life;  Importance of blood pressure control; -Educated on Importance of weighing daily; if you gain more than 3 pounds in one day or 5 pounds in one week, contact cardiology -Recommended to continue current medication  Poor appetite  -Not ideally controlled - pt reports appetite is still poor, he continues to lose weight (122# 1 year ago, now 107#) -Current treatment  Mirtazapine 15 mg daily HS - Appropriate, Query Effective -Medications previously tried: n/a  -Discussed possibility of using Boost/Ensure to supplement diet -Recommended to continue current medication  Tobacco use (Goal: quit smoking) -Uncontrolled - pt is not ready quit; currently smoking ~1 ppd -Previous quit attempts: Chantix (recalled), bupropion (dry mouth) -On a scale of 1-10, reports MOTIVATION to quit is 0 -On a scale of 1-10, reports CONFIDENCE in quitting is 0 -Counseled on risks of continued smoking and benefits of cessation; offered help if patient changes his  mind in  the future  GERD (Goal: manage symptoms) -Controlled - pt reports stomach symptoms are under control; he stopped taking pantoprazole regularly over 6 months ago and denies issues -Current treatment  Famotidine 20 mg daily HS - Appropriate, Effective, Safe, Accessible -Recommend to continue current medication  RLS (Goal: manage symptoms) -Not ideally controlled - pt reports RLS symptoms are somewhat worse, sometimes he takes extra 1/2 dose ~3am after symptoms wake him up -Current treatment  Ropinirole 3 mg - 1/2 tab AM and 1 tab HS Ferrous sulfate 325 mg daily -Explained pt is on maximum dose of ropinirole (manufacturer does not recommend exceeding 4 mg/day); other options for RLS include gabapentin or pregabalin; pt is ok with continuing ropinirole for now and will contact PCP if he wishes to change treatment  Health Maintenance -Vaccine gaps: Shingrix, covid booster, TDAP -Counseled on Covid booster, Shingrix. Advised he get vaccines at local pharmacy. -4 Hospital/ED visits Nov-Dec 2023 for sepsis, acute HF, epistaxis, and testicular pain; reviewed notes and discussed importance of self-monitoring and contacting providers with new/worsening symptoms   Charlene Brooke, PharmD, BCACP Clinical Pharmacist [facility] [phone]

## 2022-04-14 NOTE — Patient Instructions (Signed)
Visit Information  Phone number for Pharmacist: [phone]  Thank you for meeting with me to discuss your medications! Below is a summary of what we talked about during the visit:   Recommendations/Changes made from today's visit: -Advised to monitor daily weight and call cardiology with wt gain 2+ lbs overnight or 5+ lbs in a week  Follow up plan: -[place] will call patient 2 months for HF update -Pharmacist follow up televisit scheduled for 3 months -PCP appt 05/25/22   Charlene Brooke, PharmD, BCACP Clinical Pharmacist [facility] [phone]

## 2022-05-14 ENCOUNTER — Other Ambulatory Visit: Payer: Self-pay | Admitting: Family Medicine

## 2022-05-16 NOTE — Telephone Encounter (Signed)
Last office visit 02/23/22 for hospital follow up.  Last refilled 03/04/22 for #30 with no refills.  Next Appt: 05/25/22 for follow up multiple issues.

## 2022-05-18 ENCOUNTER — Other Ambulatory Visit: Payer: Self-pay

## 2022-05-18 DIAGNOSIS — I6523 Occlusion and stenosis of bilateral carotid arteries: Secondary | ICD-10-CM

## 2022-05-18 DIAGNOSIS — I714 Abdominal aortic aneurysm, without rupture, unspecified: Secondary | ICD-10-CM

## 2022-05-25 ENCOUNTER — Encounter: Payer: Self-pay | Admitting: Family Medicine

## 2022-05-25 ENCOUNTER — Ambulatory Visit (INDEPENDENT_AMBULATORY_CARE_PROVIDER_SITE_OTHER): Payer: Medicare Other | Admitting: Family Medicine

## 2022-05-25 VITALS — BP 130/60 | HR 72 | Temp 98.0°F | Ht 66.0 in | Wt 114.5 lb

## 2022-05-25 DIAGNOSIS — D508 Other iron deficiency anemias: Secondary | ICD-10-CM

## 2022-05-25 DIAGNOSIS — I5022 Chronic systolic (congestive) heart failure: Secondary | ICD-10-CM

## 2022-05-25 DIAGNOSIS — E43 Unspecified severe protein-calorie malnutrition: Secondary | ICD-10-CM | POA: Diagnosis not present

## 2022-05-25 DIAGNOSIS — I10 Essential (primary) hypertension: Secondary | ICD-10-CM

## 2022-05-25 DIAGNOSIS — G2581 Restless legs syndrome: Secondary | ICD-10-CM

## 2022-05-25 LAB — CBC WITH DIFFERENTIAL/PLATELET
Basophils Absolute: 0.1 10*3/uL (ref 0.0–0.1)
Basophils Relative: 0.9 % (ref 0.0–3.0)
Eosinophils Absolute: 0.2 10*3/uL (ref 0.0–0.7)
Eosinophils Relative: 2.4 % (ref 0.0–5.0)
HCT: 27.4 % — ABNORMAL LOW (ref 39.0–52.0)
Hemoglobin: 8.8 g/dL — ABNORMAL LOW (ref 13.0–17.0)
Lymphocytes Relative: 12.1 % (ref 12.0–46.0)
Lymphs Abs: 1.1 10*3/uL (ref 0.7–4.0)
MCHC: 32 g/dL (ref 30.0–36.0)
MCV: 85.7 fl (ref 78.0–100.0)
Monocytes Absolute: 0.7 10*3/uL (ref 0.1–1.0)
Monocytes Relative: 7.4 % (ref 3.0–12.0)
Neutro Abs: 7.2 10*3/uL (ref 1.4–7.7)
Neutrophils Relative %: 77.2 % — ABNORMAL HIGH (ref 43.0–77.0)
Platelets: 475 10*3/uL — ABNORMAL HIGH (ref 150.0–400.0)
RBC: 3.2 Mil/uL — ABNORMAL LOW (ref 4.22–5.81)
RDW: 16.2 % — ABNORMAL HIGH (ref 11.5–15.5)
WBC: 9.3 10*3/uL (ref 4.0–10.5)

## 2022-05-25 LAB — IBC + FERRITIN
Ferritin: 37.2 ng/mL (ref 22.0–322.0)
Iron: 14 ug/dL — ABNORMAL LOW (ref 42–165)
Saturation Ratios: 5 % — ABNORMAL LOW (ref 20.0–50.0)
TIBC: 282.8 ug/dL (ref 250.0–450.0)
Transferrin: 202 mg/dL — ABNORMAL LOW (ref 212.0–360.0)

## 2022-05-25 MED ORDER — GABAPENTIN 100 MG PO CAPS: 100.0000 mg | ORAL_CAPSULE | Freq: Every day | ORAL | 0 refills | Status: DC

## 2022-05-25 NOTE — Assessment & Plan Note (Signed)
Chronic, euvolemic in office today Followed by cardiology.  Using Lasix 20 mg as needed

## 2022-05-25 NOTE — Assessment & Plan Note (Addendum)
Improving with increase appetite.  On Remeron 15 mg daily.... Will stop this medication as we are starting trial of gabapentin.  He will keep a close eye on his appetite and if it decreases we will consider restarting.

## 2022-05-25 NOTE — Assessment & Plan Note (Signed)
Chronic, inadequate control despite max dose of ropinirole.  Will try trial of gabapentin 100 mg p.o. nightly, may need to titrate up dose.

## 2022-05-25 NOTE — Assessment & Plan Note (Signed)
Stable, chronic.  Continue current medication.  Farxiga 10 mg p.o. daily, Entresto 24/26 twice daily, Toprol XL 25 mg daily, spironolactone 12.5 mg daily.

## 2022-05-25 NOTE — Assessment & Plan Note (Signed)
Due for reevaluation 

## 2022-05-25 NOTE — Progress Notes (Signed)
Patient ID: Erik Scarlet., male    DOB: 1940-10-28, 82 y.o.   MRN: [id number]  This visit was conducted in person.  BP 130/60   Pulse 72   Temp 98 F (36.7 C) (Temporal)   Ht '5\' 6"'$  (1.676 m)   Wt 114 lb 8 oz (51.9 kg)   SpO2 99%   BMI 18.48 kg/m    CC:  Chief Complaint  Patient presents with   Medical Management of Chronic Issues    Follow up multiple issues    Subjective:   HPI: Erik Kawalec. is a 82 y.o. male  with a history of COPD, HTN , PAD with bilateral CEAs, ICM, CAD s/p PCI of proximal LAD and proximal RCA ([id number]), chronic systolic HF and tobacco abuse. presenting on 05/25/2022 for Medical Management of Chronic Issues (Follow up multiple issues)  Symptomatic anemia secondary to epistaxis December 2023.   Admitted for MSSA bacteremia , completed treatment.  Released from ID Dr. Graylon Good per March 18, 2022 office visit note.  Reviewed appointment from heart failure clinic.  Seen for chronic systolic heart failure. Continued on Farxiga 10 mg p.o. daily, Entresto 24/26 twice daily, Toprol XL 25 mg daily, spironolactone 12.5 mg daily. CAD: On Plavix and ASA.  He has had significant weight gain since his hospitalization. Wt Readings from Last 3 Encounters:  05/25/22 114 lb 8 oz (51.9 kg)  03/31/22 109 lb (49.4 kg)  03/18/22 110 lb (49.9 kg)   COPD, heavy smoker, not interested in quitting.  History of carotid stenosis with bilateral CEA, PAD  and renal stent followed by vascular.     Today he reports  he has been feeling or hungry overall, eating more at bedtime.  Still some lower energy, but feeling better overall.   No further bleeding, no nose bleeds.   NO current epigastric pain   Restless leg keeping up at night.Marland Kitchen despite  max ropinirole... feels like electric sho I love to see you at a walk-in 120 or something like that  HTN, well controlled BP Readings from Last 3 Encounters:  05/25/22 130/60  03/31/22 120/60  03/18/22 120/61     Relevant past medical, surgical, family and social history reviewed and updated as indicated. Interim medical history since our last visit reviewed. Allergies and medications reviewed and updated. Outpatient Medications Prior to Visit  Medication Sig Dispense Refill   acetaminophen (TYLENOL) 500 MG tablet Take 1,000 mg by mouth at bedtime.     aspirin EC 81 MG tablet Take 81 mg by mouth every evening.      clopidogrel (PLAVIX) 75 MG tablet TAKE 1 TABLET BY MOUTH EVERY DAY 90 tablet 3   cyanocobalamin (VITAMIN B12) 1000 MCG tablet Take 1,000 mcg by mouth daily.     dapagliflozin propanediol (FARXIGA) 10 MG TABS tablet Take 1 tablet (10 mg total) by mouth daily before breakfast. 30 tablet 11   ezetimibe-simvastatin (VYTORIN) 10-40 MG tablet TAKE 1 TABLET BY MOUTH EVERYDAY AT BEDTIME 90 tablet 3   famotidine (PEPCID) 40 MG tablet Take 40 mg by mouth 2 (two) times daily.     ferrous sulfate 325 (65 FE) MG tablet Take 325 mg by mouth daily with breakfast.     furosemide (LASIX) 20 MG tablet Take 20 mg by mouth as needed for fluid or edema (2+ weight gain overnight or 5+ wt gain in a week).     methocarbamol (ROBAXIN) 500 MG tablet TAKE 1 TABLET (500 MG TOTAL) BY  MOUTH EVERY DAY AT BEDTIME AS NEEDED FOR MUSCLE SPASM 30 tablet 0   metoprolol succinate (TOPROL-XL) 50 MG 24 hr tablet TAKE 1 TABLET BY MOUTH EVERY DAY WITH OR IMMEDIATELY FOLLOWING MEAL (NEEDS OFFICE VISIT) 90 tablet 0   mirtazapine (REMERON) 15 MG tablet Take 15 mg by mouth at bedtime.     nitroGLYCERIN (NITROSTAT) 0.4 MG SL tablet Place 1 tablet (0.4 mg total) under the tongue every 5 (five) minutes x 3 doses as needed for chest pain. 30 tablet 3   Omega-3 Fatty Acids (FISH OIL) 1000 MG CAPS Take 1 capsule by mouth daily.     oxymetazoline (AFRIN) 0.05 % nasal spray Place 2 sprays into both nostrils 2 (two) times daily as needed (For nose bleeding). 15 mL 2   sodium chloride (OCEAN) 0.65 % SOLN nasal spray Place 2 sprays into both  nostrils every hour as needed (dryness). 88 mL 2   spironolactone (ALDACTONE) 25 MG tablet TAKE 1/2 TABLET BY MOUTH EVERY DAY 45 tablet 3   tamsulosin (FLOMAX) 0.4 MG CAPS capsule TAKE 1 CAPSULE BY MOUTH EVERY DAY 90 capsule 3   rOPINIRole (REQUIP) 3 MG tablet TAKE 1/2 TABLET BY MOUTH IN THE MORNING & TAKE 1 TABLET AT NIGHT 135 tablet 1   No facility-administered medications prior to visit.     Per HPI unless specifically indicated in ROS section below Review of Systems  Constitutional:  Negative for fatigue and fever.  HENT:  Negative for ear pain.   Eyes:  Negative for pain.  Respiratory:  Negative for cough and shortness of breath.   Cardiovascular:  Negative for chest pain, palpitations and leg swelling.  Gastrointestinal:  Negative for abdominal pain.  Genitourinary:  Negative for dysuria.  Musculoskeletal:  Negative for arthralgias.  Neurological:  Negative for syncope, light-headedness and headaches.  Psychiatric/Behavioral:  Negative for dysphoric mood.    Objective:  BP 130/60   Pulse 72   Temp 98 F (36.7 C) (Temporal)   Ht '5\' 6"'$  (1.676 m)   Wt 114 lb 8 oz (51.9 kg)   SpO2 99%   BMI 18.48 kg/m   Wt Readings from Last 3 Encounters:  05/25/22 114 lb 8 oz (51.9 kg)  03/31/22 109 lb (49.4 kg)  03/18/22 110 lb (49.9 kg)      Physical Exam Constitutional:      Appearance: He is well-developed.  HENT:     Head: Normocephalic.     Right Ear: Hearing normal.     Left Ear: Hearing normal.     Nose: Nose normal.  Neck:     Thyroid: No thyroid mass or thyromegaly.     Vascular: No carotid bruit.     Trachea: Trachea normal.  Cardiovascular:     Rate and Rhythm: Normal rate and regular rhythm.     Pulses: Normal pulses.     Heart sounds: Heart sounds not distant. Murmur heard.     Systolic murmur is present.     No friction rub. No gallop.     Comments: slight peripheral edema Pulmonary:     Effort: Pulmonary effort is normal. No respiratory distress.     Breath  sounds: Normal breath sounds.  Skin:    General: Skin is warm and dry.     Findings: No rash.  Psychiatric:        Speech: Speech normal.        Behavior: Behavior normal.        Thought Content: Thought content normal.  Results for orders placed or performed during the hospital encounter of 03/09/22  Lipase, blood  Result Value Ref Range   Lipase 61 (H) 11 - 51 U/L  Comprehensive metabolic panel  Result Value Ref Range   Sodium 133 (L) 135 - 145 mmol/L   Potassium 4.4 3.5 - 5.1 mmol/L   Chloride 102 98 - 111 mmol/L   CO2 25 22 - 32 mmol/L   Glucose, Bld 106 (H) 70 - 99 mg/dL   BUN 18 8 - 23 mg/dL   Creatinine, Ser 1.15 0.61 - 1.24 mg/dL   Calcium 8.6 (L) 8.9 - 10.3 mg/dL   Total Protein 7.9 6.5 - 8.1 g/dL   Albumin 2.8 (L) 3.5 - 5.0 g/dL   AST 19 15 - 41 U/L   ALT 9 0 - 44 U/L   Alkaline Phosphatase 55 38 - 126 U/L   Total Bilirubin 0.2 (L) 0.3 - 1.2 mg/dL   GFR, Estimated >60 >60 mL/min   Anion gap 6 5 - 15  CBC  Result Value Ref Range   WBC 11.8 (H) 4.0 - 10.5 K/uL   RBC 3.74 (L) 4.22 - 5.81 MIL/uL   Hemoglobin 10.4 (L) 13.0 - 17.0 g/dL   HCT 33.5 (L) 39.0 - 52.0 %   MCV 89.6 80.0 - 100.0 fL   MCH 27.8 26.0 - 34.0 pg   MCHC 31.0 30.0 - 36.0 g/dL   RDW 16.8 (H) 11.5 - 15.5 %   Platelets 304 150 - 400 K/uL   nRBC 0.0 0.0 - 0.2 %  Urinalysis, Routine w reflex microscopic Urine, Clean Catch  Result Value Ref Range   Color, Urine YELLOW YELLOW   APPearance CLEAR CLEAR   Specific Gravity, Urine >1.046 (H) 1.005 - 1.030   pH 5.0 5.0 - 8.0   Glucose, UA 150 (A) NEGATIVE mg/dL   Hgb urine dipstick NEGATIVE NEGATIVE   Bilirubin Urine NEGATIVE NEGATIVE   Ketones, ur NEGATIVE NEGATIVE mg/dL   Protein, ur NEGATIVE NEGATIVE mg/dL   Nitrite NEGATIVE NEGATIVE   Leukocytes,Ua NEGATIVE NEGATIVE    Assessment and Plan  Primary hypertension Assessment & Plan: Stable, chronic.  Continue current medication.  Farxiga 10 mg p.o. daily, Entresto 24/26 twice daily,  Toprol XL 25 mg daily, spironolactone 12.5 mg daily.     Chronic systolic heart failure (HCC) Assessment & Plan: Chronic, euvolemic in office today Followed by cardiology.  Using Lasix 20 mg as needed   Iron deficiency anemia secondary to inadequate dietary iron intake Assessment & Plan: Due for reevaluation  Orders: -     CBC with Differential/Platelet -     IBC + Ferritin  Protein-calorie malnutrition, severe Assessment & Plan:  Improving with increase appetite.  On Remeron 15 mg daily.... Will stop this medication as we are starting trial of gabapentin.  He will keep a close eye on his appetite and if it decreases we will consider restarting.   Restless leg syndrome Assessment & Plan: Chronic, inadequate control despite max dose of ropinirole.  Will try trial of gabapentin 100 mg p.o. nightly, may need to titrate up dose.   Other orders -     Gabapentin; Take 1 capsule (100 mg total) by mouth at bedtime.  Dispense: 30 capsule; Refill: 0    Return in about 3 months (around 08/25/2022) for phone AMW,  fasting labs then 22mn CPE with me.   AEliezer Lofts MD

## 2022-05-25 NOTE — Patient Instructions (Addendum)
Please stop at the lab to have labs drawn.  Will try trial of gabapentin for pain in feet at night... call if not helping as expected.  Stop Remeron... consider restart if appetite decreasing again.

## 2022-05-27 ENCOUNTER — Ambulatory Visit (INDEPENDENT_AMBULATORY_CARE_PROVIDER_SITE_OTHER)
Admission: RE | Admit: 2022-05-27 | Discharge: 2022-05-27 | Disposition: A | Discharge status: Home / Self Care | Payer: Medicare Other | Source: Ambulatory Visit | Attending: Vascular Surgery | Admitting: Vascular Surgery

## 2022-05-27 ENCOUNTER — Ambulatory Visit (HOSPITAL_COMMUNITY)
Admission: RE | Admit: 2022-05-27 | Discharge: 2022-05-27 | Disposition: A | Discharge status: Home / Self Care | Payer: Medicare Other | Source: Ambulatory Visit | Attending: Vascular Surgery | Admitting: Vascular Surgery

## 2022-05-27 ENCOUNTER — Ambulatory Visit (INDEPENDENT_AMBULATORY_CARE_PROVIDER_SITE_OTHER): Payer: Medicare Other | Admitting: Physician Assistant

## 2022-05-27 VITALS — BP 159/51 | HR 60 | Temp 97.5°F | Resp 20 | Ht 66.0 in | Wt 114.4 lb

## 2022-05-27 DIAGNOSIS — I714 Abdominal aortic aneurysm, without rupture, unspecified: Secondary | ICD-10-CM

## 2022-05-27 DIAGNOSIS — I6523 Occlusion and stenosis of bilateral carotid arteries: Secondary | ICD-10-CM | POA: Diagnosis not present

## 2022-05-27 DIAGNOSIS — F17200 Nicotine dependence, unspecified, uncomplicated: Secondary | ICD-10-CM

## 2022-05-27 NOTE — Progress Notes (Signed)
Office Note     CC:  follow up Requesting Provider:  [name], MD  HPI: Erik [name]. is a 82 y.o. (07-25-1940) male who presents for surveillance.  He underwent right CEA in February 2010 by Dr. Scot Dock due to symptomatic stenosis and left CEA in March 2010 by Dr. Scot Dock due to asymptomatic stenosis.  He also has surgical history significant for fenestrated EVAR with Z FEN device in [facility] in March 2017 by Dr. Sammuel Hines.  He denies any new or changing abdominal or back pain.  Patient states he has a follow-up with Dr. Sammuel Hines next month involving a CTA chest abdomen pelvis.  He also denies any neurological events since last office visit including slurring speech, changes in vision, or one-sided weakness.  He is on aspirin and Plavix daily.  He is a daily smoker and smokes about 1 pack a day.  He has no interest in quitting.   Past Medical History:  Diagnosis Date   Adenomatous colon polyp    Aortic aneurysm (HCC)    BPH (benign prostatic hypertrophy)    CAD (coronary artery disease)    Carotid artery stenosis    a. Bilateral CEA   Cataract    CHF (congestive heart failure) (HCC)    Chronic systolic heart failure (HCC)    a. EF 20-25%, mild LVH, mod HK, mid apicalanteroseptal myocardium, mild MR, LA mod dilated   Collagen vascular disease (HCC)    COPD (chronic obstructive pulmonary disease) ([place])    Coronary artery disease    a. LHC (08/2013): Lmain: short 30% distal, LAD: sml D1 & D2, 70% ostial D1, 95-99% LAD stenosis prox D2 LCx: sml/mod ramus subtot. occluded, 40% ostial set off lg OM1, 40% AV LCx after OM1, RCA: 90% prox (DES to RCA and prox LAD)   Diverticulosis    GERD (gastroesophageal reflux disease)    Heart murmur    History of colonic polyps    Hyperlipidemia    Hyperplastic colon polyp    Hypertension    Ischemic cardiomyopathy    RLS (restless legs syndrome)     Past Surgical History:  Procedure Laterality Date   BUBBLE STUDY  02/03/2022    Procedure: BUBBLE STUDY;  Surgeon: Freada Bergeron, MD;  Location: [facility];  Service: Cardiovascular;;   cardiac stents  09-2013   CAROTID ENDARTERECTOMY  04/17/2008   right   CAROTID ENDARTERECTOMY  05/30/08   Left   CATARACT EXTRACTION W/PHACO Right 10/05/2018   Procedure: CATARACT EXTRACTION PHACO AND INTRAOCULAR LENS PLACEMENT ([place]), RIGHT;  Surgeon: Birder Robson, MD;  Location: [facility] ORS;  Service: Ophthalmology;  Laterality: Right;  [place] 01:06.4 cde 12.79 Fluid Pack Lot # [id number]   CATARACT EXTRACTION W/PHACO Left 11/02/2018   Procedure: CATARACT EXTRACTION PHACO AND INTRAOCULAR LENS PLACEMENT ([place]), LEFT;  Surgeon: Birder Robson, MD;  Location: [facility] ORS;  Service: Ophthalmology;  Laterality: Left;  [place]  01:10 CDE 11.52 Fluid pack lot # [id number]   CHOLECYSTECTOMY     Gall Bladder   CORONARY ANGIOGRAM  09/11/2013   Procedure: CORONARY ANGIOGRAM;  Surgeon: Troy Sine, MD;  Location[facility] CATH LAB;  Service: Cardiovascular;;   CORONARY ANGIOPLASTY     STENTS   HERNIA REPAIR     lower aorta aneurysm  05/26/2015   [facility]   PERCUTANEOUS STENT INTERVENTION  09/11/2013   Procedure: PERCUTANEOUS STENT INTERVENTION;  Surgeon: Troy Sine, MD;  Location: [facility] CATH LAB;  Service: Cardiovascular;;  DES Prox RCA 3.5x15  xience    TEE WITHOUT CARDIOVERSION N/A 02/03/2022   Procedure: TRANSESOPHAGEAL ECHOCARDIOGRAM (TEE);  Surgeon: Freada Bergeron, MD;  Location: [facility] ENDOSCOPY;  Service: Cardiovascular;  Laterality: N/A;    Social History   Socioeconomic History   Marital status: Married    Spouse name: Not on file   Number of children: Not on file   Years of education: Not on file   Highest education level: Not on file  Occupational History   Occupation: Retired Development worker, community  Tobacco Use   Smoking status: Every Day    Packs/day: 1.00    Years: 50.00    Additional pack years: 0.00    Total pack years: 50.00    Types: Cigarettes    Passive exposure: Never   Smokeless  tobacco: Current   Tobacco comments:    off and on since age 25, currently smokes a pack a day [facility] 12/23/20  Vaping Use   Vaping Use: Never used  Substance and Sexual Activity   Alcohol use: No   Drug use: No   Sexual activity: Yes  Other Topics Concern   Not on file  Social History Narrative   Lives with wife in [place]. Retired from the post office   Social Determinants of Radio broadcast assistant Strain: Low Risk  (04/29/2021)   Overall Financial Resource Strain (CARDIA)    Difficulty of Paying Living Expenses: Not very hard  Food Insecurity: No Food Insecurity (02/22/2022)   Hunger Vital Sign    Worried About Running Out of Food in the Last Year: Never true    [place] in the Last Year: Never true  Transportation Needs: No Transportation Needs (02/22/2022)   PRAPARE - Hydrologist (Medical): No    Lack of Transportation (Non-Medical): No  Physical Activity: Inactive (05/31/2019)   Exercise Vital Sign    Days of Exercise per Week: 0 days    Minutes of Exercise per Session: 0 min  Stress: No Stress Concern Present (05/31/2019)   [place]    Feeling of Stress : Not at all  Social Connections: Not on file  Intimate Partner Violence: Not At Risk (02/17/2022)   Humiliation, Afraid, Rape, and Kick questionnaire    Fear of Current or Ex-Partner: No    Emotionally Abused: No    Physically Abused: No    Sexually Abused: No    Family History  Problem Relation Age of Onset   Heart disease Brother    Hyperlipidemia Brother    Pancreatic cancer Brother    Hypertension Mother    Cancer Father        unknown type; sounds GI   Emphysema Father    Hyperlipidemia Brother    Hyperlipidemia Brother    Heart attack Brother    Hyperlipidemia Brother    Multiple sclerosis Brother    Kidney disease Neg Hx    Prostate cancer Neg Hx    Colon cancer Neg Hx    Esophageal  cancer Neg Hx    Rectal cancer Neg Hx    Stomach cancer Neg Hx     Current Outpatient Medications  Medication Sig Dispense Refill   acetaminophen (TYLENOL) 500 MG tablet Take 1,000 mg by mouth at bedtime.     aspirin EC 81 MG tablet Take 81 mg by mouth every evening.      clopidogrel (PLAVIX) 75 MG tablet TAKE 1 TABLET BY MOUTH EVERY DAY 90  tablet 3   cyanocobalamin (VITAMIN B12) 1000 MCG tablet Take 1,000 mcg by mouth daily.     dapagliflozin propanediol (FARXIGA) 10 MG TABS tablet Take 1 tablet (10 mg total) by mouth daily before breakfast. 30 tablet 11   ezetimibe-simvastatin (VYTORIN) 10-40 MG tablet TAKE 1 TABLET BY MOUTH EVERYDAY AT BEDTIME 90 tablet 3   famotidine (PEPCID) 40 MG tablet Take 40 mg by mouth 2 (two) times daily.     ferrous sulfate 325 (65 FE) MG tablet Take 325 mg by mouth daily with breakfast.     furosemide (LASIX) 20 MG tablet Take 20 mg by mouth as needed for fluid or edema (2+ weight gain overnight or 5+ wt gain in a week).     gabapentin (NEURONTIN) 100 MG capsule Take 1 capsule (100 mg total) by mouth at bedtime. 30 capsule 0   methocarbamol (ROBAXIN) 500 MG tablet TAKE 1 TABLET (500 MG TOTAL) BY MOUTH EVERY DAY AT BEDTIME AS NEEDED FOR MUSCLE SPASM 30 tablet 0   metoprolol succinate (TOPROL-XL) 50 MG 24 hr tablet TAKE 1 TABLET BY MOUTH EVERY DAY WITH OR IMMEDIATELY FOLLOWING MEAL (NEEDS OFFICE VISIT) 90 tablet 0   mirtazapine (REMERON) 15 MG tablet Take 15 mg by mouth at bedtime.     nitroGLYCERIN (NITROSTAT) 0.4 MG SL tablet Place 1 tablet (0.4 mg total) under the tongue every 5 (five) minutes x 3 doses as needed for chest pain. 30 tablet 3   Omega-3 Fatty Acids (FISH OIL) 1000 MG CAPS Take 1 capsule by mouth daily.     oxymetazoline (AFRIN) 0.05 % nasal spray Place 2 sprays into both nostrils 2 (two) times daily as needed (For nose bleeding). 15 mL 2   sodium chloride (OCEAN) 0.65 % SOLN nasal spray Place 2 sprays into both nostrils every hour as needed  (dryness). 88 mL 2   spironolactone (ALDACTONE) 25 MG tablet TAKE 1/2 TABLET BY MOUTH EVERY DAY 45 tablet 3   tamsulosin (FLOMAX) 0.4 MG CAPS capsule TAKE 1 CAPSULE BY MOUTH EVERY DAY 90 capsule 3   No current facility-administered medications for this visit.    Allergies  Allergen Reactions   Codeine Anaphylaxis and Swelling    throat swells     REVIEW OF SYSTEMS:   '[X]'$  denotes positive finding, '[ ]'$  denotes negative finding Cardiac  Comments:  Chest pain or chest pressure:    Shortness of breath upon exertion:    Short of breath when lying flat:    Irregular heart rhythm:        Vascular    Pain in calf, thigh, or hip brought on by ambulation:    Pain in feet at night that wakes you up from your sleep:     Blood clot in your veins:    Leg swelling:         Pulmonary    Oxygen at home:    Productive cough:     Wheezing:         Neurologic    Sudden weakness in arms or legs:     Sudden numbness in arms or legs:     Sudden onset of difficulty speaking or slurred speech:    Temporary loss of vision in one eye:     Problems with dizziness:         Gastrointestinal    Blood in stool:     Vomited blood:         Genitourinary    Burning when urinating:  Blood in urine:        Psychiatric    Major depression:         Hematologic    Bleeding problems:    Problems with blood clotting too easily:        Skin    Rashes or ulcers:        Constitutional    Fever or chills:      PHYSICAL EXAMINATION:  Vitals:   05/27/22 0844 05/27/22 0846  BP: (!) 150/52 (!) 159/51  Pulse: 60   Resp: 20   Temp: (!) 97.5 F (36.4 C)   TempSrc: Temporal   SpO2: 100%   Weight: 114 lb 6.4 oz (51.9 kg)   Height: '5\' 6"'$  (1.676 m)     General:  WDWN in NAD; vital signs documented above Gait: Not observed HENT: WNL, normocephalic Pulmonary: normal non-labored breathing , without Rales, rhonchi,  wheezing Cardiac: regular HR Abdomen: soft, NT, no masses Skin: without  rashes Extremities: without ischemic changes, without Gangrene , without cellulitis; without open wounds;  Musculoskeletal: no muscle wasting or atrophy  Neurologic: A&O X 3;  No focal weakness or paresthesias are detected Psychiatric:  The pt has Normal affect.   Non-Invasive Vascular Imaging:   EVAR duplex shows aneurysmal sac measuring 4.2 cm at largest diameter.  He has a CTA abdomen and pelvis from December 2023 that shows AAA sac size measuring 4.5 cm at largest diameter No evidence of endoleak   Bilateral carotid endarterectomy sites without restenosis    ASSESSMENT/PLAN:: 82 y.o. male here for follow up for surveillance of FEVAR and carotid stenosis  - FEVAR duplex shows 4.2 cm AAA sac at the largest diameter.  He does have a CT abdomen and pelvis with contrast from December 2023 that demonstrates a AAA sac measuring 4.5 cm.  No evidence of endoleak by duplex.  He will follow-up with Dr. Sammuel Hines next month for a repeat CT as well.  -Bilateral internal carotid arteries without stenosis.  Endarterectomy sites are widely patent.  We will repeat carotid duplex in 1 year -Encouraged smoking cessation however no interest in quitting -Continue aspirin and Plavix daily -Patient knows to call/return office sooner with any questions or concerns.   Dagoberto Ligas, PA-C [facility] [phone]  Clinic MD:   Trula Slade

## 2022-05-31 ENCOUNTER — Other Ambulatory Visit (INDEPENDENT_AMBULATORY_CARE_PROVIDER_SITE_OTHER): Payer: Medicare Other | Admitting: Radiology

## 2022-05-31 DIAGNOSIS — D508 Other iron deficiency anemias: Secondary | ICD-10-CM

## 2022-06-01 ENCOUNTER — Other Ambulatory Visit: Payer: Self-pay | Admitting: Family Medicine

## 2022-06-01 ENCOUNTER — Telehealth: Payer: Self-pay | Admitting: Internal Medicine

## 2022-06-01 DIAGNOSIS — D508 Other iron deficiency anemias: Secondary | ICD-10-CM

## 2022-06-01 LAB — HEMOCCULT SLIDES (X 3 CARDS)
Fecal Occult Blood: NEGATIVE
OCCULT 1: NEGATIVE
OCCULT 2: NEGATIVE
OCCULT 3: NEGATIVE
OCCULT 4: NEGATIVE
OCCULT 5: NEGATIVE

## 2022-06-01 NOTE — Telephone Encounter (Signed)
scheduled per 3/19 referral, pt has been called and confirmed date and time. Pt is aware of location and to arrive early for check in

## 2022-06-03 DIAGNOSIS — N50812 Left testicular pain: Secondary | ICD-10-CM

## 2022-06-04 ENCOUNTER — Telehealth (HOSPITAL_COMMUNITY): Payer: Self-pay

## 2022-06-04 NOTE — Telephone Encounter (Signed)
He cannot afford [place]. They used coupon already. It was $95 today.

## 2022-06-07 ENCOUNTER — Other Ambulatory Visit (HOSPITAL_COMMUNITY): Payer: Self-pay

## 2022-06-16 ENCOUNTER — Other Ambulatory Visit (HOSPITAL_COMMUNITY): Payer: Self-pay | Admitting: Cardiology

## 2022-06-17 ENCOUNTER — Other Ambulatory Visit (HOSPITAL_COMMUNITY): Payer: Self-pay

## 2022-06-17 ENCOUNTER — Ambulatory Visit
Admission: RE | Admit: 2022-06-17 | Discharge: 2022-06-17 | Disposition: A | Discharge status: Home / Self Care | Payer: Medicare Other | Source: Ambulatory Visit | Attending: Acute Care | Admitting: Acute Care

## 2022-06-17 DIAGNOSIS — I700 Atherosclerosis of aorta: Secondary | ICD-10-CM | POA: Diagnosis not present

## 2022-06-17 DIAGNOSIS — R918 Other nonspecific abnormal finding of lung field: Secondary | ICD-10-CM

## 2022-06-17 DIAGNOSIS — J439 Emphysema, unspecified: Secondary | ICD-10-CM | POA: Diagnosis not present

## 2022-06-17 DIAGNOSIS — R911 Solitary pulmonary nodule: Secondary | ICD-10-CM | POA: Diagnosis not present

## 2022-06-17 NOTE — Telephone Encounter (Signed)
Patient Advocate Encounter  Spoke with patient by phone. He was previously getting refills for $0, and was recently informed that 30 day supply is ~96$. Patient does not have Part D coverage at this time, and is using commercial insurance. Test claims with [facility] pharmacy return $0 copay.  Copay card used for test claim is as follows: BIN: [id number] PCN: CN GROUP: [id number]  I have instructed the patient to contact me when the next [facility] refill is due so that I can assist with the billing for the next month supply. Pt expressed understanding.  Clista Bernhardt, CPhT Rx Patient Advocate Phone: [phone]

## 2022-06-21 ENCOUNTER — Other Ambulatory Visit: Payer: Self-pay | Admitting: Family Medicine

## 2022-06-21 ENCOUNTER — Other Ambulatory Visit (HOSPITAL_COMMUNITY): Payer: Self-pay | Admitting: Cardiology

## 2022-06-22 ENCOUNTER — Other Ambulatory Visit (HOSPITAL_COMMUNITY): Payer: Self-pay

## 2022-06-22 ENCOUNTER — Other Ambulatory Visit: Payer: Self-pay

## 2022-06-22 DIAGNOSIS — D508 Other iron deficiency anemias: Secondary | ICD-10-CM

## 2022-06-22 NOTE — Telephone Encounter (Signed)
Last office visit 05/25/2022 for HTN, CHF, Iron Deficiency Anemia, Malnutrition and Restless Legs.  Last refilled:  Gabapentin 05/25/2022 for #30 with no refills.  Methocarbamol 05/18/2022 for #30 with no refills.  Next Appt: 08/26/22 for CPE.

## 2022-06-23 ENCOUNTER — Inpatient Hospital Stay: Payer: Medicare Other

## 2022-06-23 ENCOUNTER — Inpatient Hospital Stay: Payer: Medicare Other | Attending: Internal Medicine | Admitting: Internal Medicine

## 2022-06-23 VITALS — BP 144/50 | HR 65 | Temp 98.4°F | Resp 14 | Ht 66.93 in | Wt 117.1 lb

## 2022-06-23 DIAGNOSIS — N400 Enlarged prostate without lower urinary tract symptoms: Secondary | ICD-10-CM | POA: Insufficient documentation

## 2022-06-23 DIAGNOSIS — D508 Other iron deficiency anemias: Secondary | ICD-10-CM

## 2022-06-23 DIAGNOSIS — G2581 Restless legs syndrome: Secondary | ICD-10-CM | POA: Insufficient documentation

## 2022-06-23 DIAGNOSIS — Z79899 Other long term (current) drug therapy: Secondary | ICD-10-CM | POA: Insufficient documentation

## 2022-06-23 DIAGNOSIS — I110 Hypertensive heart disease with heart failure: Secondary | ICD-10-CM | POA: Diagnosis not present

## 2022-06-23 DIAGNOSIS — Z86010 Personal history of colonic polyps: Secondary | ICD-10-CM | POA: Insufficient documentation

## 2022-06-23 DIAGNOSIS — Z800 Family history of malignant neoplasm of digestive organs: Secondary | ICD-10-CM | POA: Insufficient documentation

## 2022-06-23 DIAGNOSIS — Z7902 Long term (current) use of antithrombotics/antiplatelets: Secondary | ICD-10-CM | POA: Insufficient documentation

## 2022-06-23 DIAGNOSIS — D500 Iron deficiency anemia secondary to blood loss (chronic): Secondary | ICD-10-CM | POA: Diagnosis not present

## 2022-06-23 DIAGNOSIS — I2510 Atherosclerotic heart disease of native coronary artery without angina pectoris: Secondary | ICD-10-CM | POA: Diagnosis not present

## 2022-06-23 DIAGNOSIS — D3501 Benign neoplasm of right adrenal gland: Secondary | ICD-10-CM | POA: Diagnosis not present

## 2022-06-23 DIAGNOSIS — I509 Heart failure, unspecified: Secondary | ICD-10-CM | POA: Diagnosis not present

## 2022-06-23 DIAGNOSIS — F17210 Nicotine dependence, cigarettes, uncomplicated: Secondary | ICD-10-CM

## 2022-06-23 DIAGNOSIS — D3502 Benign neoplasm of left adrenal gland: Secondary | ICD-10-CM | POA: Diagnosis not present

## 2022-06-23 LAB — FOLATE: Folate: 18.8 ng/mL (ref >5.9)

## 2022-06-23 LAB — CBC WITH DIFFERENTIAL/PLATELET
Abs Immature Granulocytes: 0.03 10*3/uL (ref 0.00–0.07)
Basophils Absolute: 0.1 10*3/uL (ref 0.0–0.1)
Basophils Relative: 1 %
Eosinophils Absolute: 0.2 10*3/uL (ref 0.0–0.5)
Eosinophils Relative: 3 %
HCT: 28.3 % — ABNORMAL LOW (ref 39.0–52.0)
Hemoglobin: 8.5 g/dL — ABNORMAL LOW (ref 13.0–17.0)
Immature Granulocytes: 0 %
Lymphocytes Relative: 12 %
Lymphs Abs: 0.9 10*3/uL (ref 0.7–4.0)
MCH: 26 pg (ref 26.0–34.0)
MCHC: 30 g/dL (ref 30.0–36.0)
MCV: 86.5 fL (ref 80.0–100.0)
Monocytes Absolute: 0.6 10*3/uL (ref 0.1–1.0)
Monocytes Relative: 9 %
Neutro Abs: 5.2 10*3/uL (ref 1.7–7.7)
Neutrophils Relative %: 75 %
Platelets: 264 10*3/uL (ref 150–400)
RBC: 3.27 MIL/uL — ABNORMAL LOW (ref 4.22–5.81)
RDW: 16.4 % — ABNORMAL HIGH (ref 11.5–15.5)
WBC: 6.9 10*3/uL (ref 4.0–10.5)
nRBC: 0 % (ref 0.0–0.2)

## 2022-06-23 LAB — COMPREHENSIVE METABOLIC PANEL
ALT: 9 U/L (ref 0–44)
AST: 13 U/L — ABNORMAL LOW (ref 15–41)
Albumin: 3.4 g/dL — ABNORMAL LOW (ref 3.5–5.0)
Alkaline Phosphatase: 62 U/L (ref 38–126)
Anion gap: 4 — ABNORMAL LOW (ref 5–15)
BUN: 15 mg/dL (ref 8–23)
CO2: 29 mmol/L (ref 22–32)
Calcium: 8.8 mg/dL — ABNORMAL LOW (ref 8.9–10.3)
Chloride: 104 mmol/L (ref 98–111)
Creatinine, Ser: 0.99 mg/dL (ref 0.61–1.24)
GFR, Estimated: >60 mL/min (ref >60)
Glucose, Bld: 134 mg/dL — ABNORMAL HIGH (ref 70–99)
Potassium: 3.8 mmol/L (ref 3.5–5.1)
Sodium: 137 mmol/L (ref 135–145)
Total Bilirubin: 0.3 mg/dL (ref 0.3–1.2)
Total Protein: 7.5 g/dL (ref 6.5–8.1)

## 2022-06-23 LAB — VITAMIN B12: Vitamin B-12: 1005 pg/mL — ABNORMAL HIGH (ref 180–914)

## 2022-06-23 LAB — IRON AND IRON BINDING CAPACITY (CC-WL,HP ONLY)
Iron: 22 ug/dL — ABNORMAL LOW (ref 45–182)
Saturation Ratios: 6 % — ABNORMAL LOW (ref 17.9–39.5)
TIBC: 353 ug/dL (ref 250–450)
UIBC: 331 ug/dL (ref 117–376)

## 2022-06-23 LAB — FERRITIN: Ferritin: 21 ng/mL — ABNORMAL LOW (ref 24–336)

## 2022-06-23 NOTE — Progress Notes (Signed)
[facility] Telephone:([phone]) [phone]   Fax:(336) [phone]  CONSULT NOTE  REFERRING PHYSICIAN: Dr. Kerby NoraAmy Bedsole  REASON FOR CONSULTATION:  82 years old white male with persistent anemia  HPI Duane LopeHaywood L Mccauley Jr. is a 82 y.o. male with past medical history significant for multiple medical problems including history of coronary artery disease, congestive heart failure, hypertension, benign prostatic hypertrophy, collagen vascular disease, aortic aneurysm, history of COPD, diverticulosis and adenomatous colon polyps.  The patient has a history of anemia that has been going on for several years.  He was evaluated by gastroenterology and had upper endoscopy and colonoscopy on September 19, 2018 that showed the colon polyps.  He has been on oral iron tablets since that time with no significant improvement of his anemia.  He was seen recently by his primary care physician and because of the persistent anemia with decline in his hemoglobin and hematocrit, he was referred to me today for evaluation and recommendation regarding his condition.  He is also currently on Plavix for the coronary artery disease. When seen today he is feeling fine except for the mild fatigue and shortness of breath with exertion and occasional dizzy spells.  He lost around 30 pounds in the last 2 years.  He has no nausea, vomiting, diarrhea or constipation.  He has no chest pain, shortness of breath but has cough with no hemoptysis.  He has no headache or visual changes.  He also has restless leg. Family history significant for father with GI malignancy.  Brother had pancreatic cancer and mother had hypertension. The patient is married and has 2 daughters.  He is currently retired and used to work for the [employer].  He was accompanied today by his wife Eber JonesCarolyn.  He has a history of smoking more than 1 pack/day for around 55 years and he unfortunately continues to smoke and I strongly encouraged him to quit.  He has no  history of alcohol or drug abuse.  HPI  Past Medical History:  Diagnosis Date   Adenomatous colon polyp    Aortic aneurysm (HCC)    BPH (benign prostatic hypertrophy)    CAD (coronary artery disease)    Carotid artery stenosis    a. Bilateral CEA   Cataract    CHF (congestive heart failure) (HCC)    Chronic systolic heart failure (HCC)    a. EF 20-25%, mild LVH, mod HK, mid apicalanteroseptal myocardium, mild MR, LA mod dilated   Collagen vascular disease (HCC)    COPD (chronic obstructive pulmonary disease) (HCC)    Coronary artery disease    a. LHC (08/2013): Lmain: short 30% distal, LAD: sml D1 & D2, 70% ostial D1, 95-99% LAD stenosis prox D2 LCx: sml/mod ramus subtot. occluded, 40% ostial set off lg OM1, 40% AV LCx after OM1, RCA: 90% prox (DES to RCA and prox LAD)   Diverticulosis    GERD (gastroesophageal reflux disease)    Heart murmur    History of colonic polyps    Hyperlipidemia    Hyperplastic colon polyp    Hypertension    Ischemic cardiomyopathy    RLS (restless legs syndrome)     Past Surgical History:  Procedure Laterality Date   BUBBLE STUDY  02/03/2022   Procedure: BUBBLE STUDY;  Surgeon: Meriam SpraguePemberton, Heather E, MD;  [facility];  Service: Cardiovascular;;   cardiac stents  09-2013   CAROTID ENDARTERECTOMY  04/17/2008   right   CAROTID ENDARTERECTOMY  05/30/08   Left  CATARACT EXTRACTION W/PHACO Right 10/05/2018   Procedure: CATARACT EXTRACTION PHACO AND INTRAOCULAR LENS PLACEMENT (IOC), RIGHT;  Surgeon: Galen Manila, MD;  Location: [facility] ORS;  Service: Ophthalmology;  Laterality: Right;  [place] 01:06.4 cde 12.79 Fluid Pack Lot # [id number]   CATARACT EXTRACTION W/PHACO Left 11/02/2018   Procedure: CATARACT EXTRACTION PHACO AND INTRAOCULAR LENS PLACEMENT (IOC), LEFT;  Surgeon: Galen Manila, MD;  Location: [facility] ORS;  Service: Ophthalmology;  Laterality: Left;  [place]  01:10 CDE 11.52 Fluid pack lot # [id number]   CHOLECYSTECTOMY     Gall Bladder    CORONARY ANGIOGRAM  09/11/2013   Procedure: CORONARY ANGIOGRAM;  Surgeon: Lennette Bihari, MD;  Location: [facility] CATH LAB;  Service: Cardiovascular;;   CORONARY ANGIOPLASTY     STENTS   HERNIA REPAIR     lower aorta aneurysm  05/26/2015   [facility]   PERCUTANEOUS STENT INTERVENTION  09/11/2013   Procedure: PERCUTANEOUS STENT INTERVENTION;  Surgeon: Lennette Bihari, MD;  Location: [facility] CATH LAB;  Service: Cardiovascular;;  DES Prox RCA 3.5x15 xience    TEE WITHOUT CARDIOVERSION N/A 02/03/2022   Procedure: TRANSESOPHAGEAL ECHOCARDIOGRAM (TEE);  Surgeon: Meriam Sprague, MD;  Location: [facility] ENDOSCOPY;  Service: Cardiovascular;  Laterality: N/A;    Family History  Problem Relation Age of Onset   Heart disease Brother    Hyperlipidemia Brother    Pancreatic cancer Brother    Hypertension Mother    Cancer Father        unknown type; sounds GI   Emphysema Father    Hyperlipidemia Brother    Hyperlipidemia Brother    Heart attack Brother    Hyperlipidemia Brother    Multiple sclerosis Brother    Kidney disease Neg Hx    Prostate cancer Neg Hx    Colon cancer Neg Hx    Esophageal cancer Neg Hx    Rectal cancer Neg Hx    Stomach cancer Neg Hx     Social History Social History   Tobacco Use   Smoking status: Every Day    Packs/day: 1.00    Years: 50.00    Additional pack years: 0.00    Total pack years: 50.00    Types: Cigarettes    Passive exposure: Never   Smokeless tobacco: Current   Tobacco comments:    off and on since age 18, currently smokes a pack a day [facility] 12/23/20  Vaping Use   Vaping Use: Never used  Substance Use Topics   Alcohol use: No   Drug use: No    Allergies  Allergen Reactions   Codeine Anaphylaxis and Swelling    throat swells    Current Outpatient Medications  Medication Sig Dispense Refill   acetaminophen (TYLENOL) 500 MG tablet Take 1,000 mg by mouth at bedtime.     aspirin EC 81 MG tablet Take 81 mg by mouth every evening.      clopidogrel (PLAVIX)  75 MG tablet TAKE 1 TABLET BY MOUTH EVERY DAY 90 tablet 3   cyanocobalamin (VITAMIN B12) 1000 MCG tablet Take 1,000 mcg by mouth daily.     dapagliflozin propanediol (FARXIGA) 10 MG TABS tablet Take 1 tablet (10 mg total) by mouth daily before breakfast. 30 tablet 11   ezetimibe-simvastatin (VYTORIN) 10-40 MG tablet TAKE 1 TABLET BY MOUTH EVERYDAY AT BEDTIME 90 tablet 3   famotidine (PEPCID) 40 MG tablet Take 40 mg by mouth 2 (two) times daily.     ferrous sulfate 325 (65 FE) MG tablet Take 325 mg  by mouth daily with breakfast.     furosemide (LASIX) 20 MG tablet Take 20 mg by mouth as needed for fluid or edema (2+ weight gain overnight or 5+ wt gain in a week).     gabapentin (NEURONTIN) 100 MG capsule TAKE 1 CAPSULE BY MOUTH AT BEDTIME. 30 capsule 0   methocarbamol (ROBAXIN) 500 MG tablet TAKE 1 TABLET (500 MG TOTAL) BY MOUTH EVERY DAY AT BEDTIME AS NEEDED FOR MUSCLE SPASM 30 tablet 0   metoprolol succinate (TOPROL-XL) 50 MG 24 hr tablet TAKE 1 TABLET BY MOUTH EVERY DAY WITH OR IMMEDIATELY FOLLOWING MEAL (NEEDS OFFICE VISIT) 90 tablet 0   mirtazapine (REMERON) 15 MG tablet Take 15 mg by mouth at bedtime.     nitroGLYCERIN (NITROSTAT) 0.4 MG SL tablet Place 1 tablet (0.4 mg total) under the tongue every 5 (five) minutes x 3 doses as needed for chest pain. 30 tablet 3   Omega-3 Fatty Acids (FISH OIL) 1000 MG CAPS Take 1 capsule by mouth daily.     oxymetazoline (AFRIN) 0.05 % nasal spray Place 2 sprays into both nostrils 2 (two) times daily as needed (For nose bleeding). 15 mL 2   sodium chloride (OCEAN) 0.65 % SOLN nasal spray Place 2 sprays into both nostrils every hour as needed (dryness). 88 mL 2   spironolactone (ALDACTONE) 25 MG tablet TAKE 1/2 TABLET BY MOUTH EVERY DAY 45 tablet 3   tamsulosin (FLOMAX) 0.4 MG CAPS capsule TAKE 1 CAPSULE BY MOUTH EVERY DAY 90 capsule 0   No current facility-administered medications for this visit.    Review of Systems  Constitutional: positive for  fatigue and weight loss Eyes: negative Ears, nose, mouth, throat, and face: negative Respiratory: positive for cough Cardiovascular: negative Gastrointestinal: negative Genitourinary:negative Integument/breast: negative Hematologic/lymphatic: negative Musculoskeletal:negative Neurological: negative Behavioral/Psych: negative Endocrine: negative Allergic/Immunologic: negative  Physical Exam  WUJ:WJXBJ, healthy, no distress, well nourished, and well developed SKIN: skin color, texture, turgor are normal, no rashes or significant lesions HEAD: Normocephalic, No masses, lesions, tenderness or abnormalities EYES: normal, PERRLA, Conjunctiva are pink and non-injected EARS: External ears normal, Canals clear OROPHARYNX:no exudate, no erythema, and lips, buccal mucosa, and tongue normal  NECK: supple, no adenopathy, no JVD LYMPH:  no palpable lymphadenopathy, no hepatosplenomegaly LUNGS: clear to auscultation , and palpation HEART: regular rate & rhythm, no murmurs, and no gallops ABDOMEN:abdomen soft, non-tender, normal bowel sounds, and no masses or organomegaly BACK: Back symmetric, no curvature., No CVA tenderness EXTREMITIES:no joint deformities, effusion, or inflammation, no edema  NEURO: alert & oriented x 3 with fluent speech, no focal motor/sensory deficits  PERFORMANCE STATUS: ECOG 1  LABORATORY DATA: Lab Results  Component Value Date   WBC 6.9 06/23/2022   HGB 8.5 (L) 06/23/2022   HCT 28.3 (L) 06/23/2022   MCV 86.5 06/23/2022   PLT 264 06/23/2022      Chemistry      Component Value Date/Time   NA 137 06/23/2022 1104   NA 138 08/18/2018 1135   K 3.8 06/23/2022 1104   CL 104 06/23/2022 1104   CO2 29 06/23/2022 1104   BUN 15 06/23/2022 1104   BUN 19 08/18/2018 1135   CREATININE 0.99 06/23/2022 1104   CREATININE 1.11 10/09/2021 1646      Component Value Date/Time   CALCIUM 8.8 (L) 06/23/2022 1104   ALKPHOS 62 06/23/2022 1104   AST 13 (L) 06/23/2022 1104    ALT 9 06/23/2022 1104   BILITOT 0.3 06/23/2022 1104  RADIOGRAPHIC STUDIES: CT Chest Wo Contrast  Result Date: 06/18/2022 CLINICAL DATA:  Lung nodule, smoker. EXAM: CT CHEST WITHOUT CONTRAST TECHNIQUE: Multidetector CT imaging of the chest was performed following the standard protocol without IV contrast. RADIATION DOSE REDUCTION: This exam was performed according to the departmental dose-optimization program which includes automated exposure control, adjustment of the mA and/or kV according to patient size and/or use of iterative reconstruction technique. COMPARISON:  06/16/2021 and 08/06/2020. FINDINGS: Cardiovascular: Atherosclerotic calcification of the aorta, aortic valve and coronary arteries. Heart is enlarged. No pericardial effusion. Mediastinum/Nodes: Mediastinal lymph nodes measure up to 12 mm in the low right paratracheal station, unchanged. No pathologically enlarged mediastinal or axillary lymph nodes. Hilar regions are difficult to definitively evaluate without IV contrast. Esophagus is grossly unremarkable. Lungs/Pleura: Biapical pleuroparenchymal scarring. Centrilobular and paraseptal emphysema. Smoking related respiratory bronchiolitis. 8 mm nodule in the apical segment right upper lobe (6 x 9 mm, 8/24), stable from 06/16/2021 but slightly enlarged from 08/06/2020, at which time it measured 6 mm. New mid and lower lung zone predominant peribronchovascular nodularity, scattered consolidation and mucoid impaction. No pleural fluid. Airway is unremarkable. Upper Abdomen: Visualized portion of the liver is unremarkable. Cholecystectomy. Bilateral adrenal nodules measure 1.9 cm and -14 also units on the right and 2.0 cm and -1 Hounsfield units on the left. No follow-up necessary. Subcentimeter low-attenuation lesion in the right kidney, too small to characterize. No specific follow-up necessary. Renal vascular calcifications bilaterally. Visualized portions of the spleen, pancreas, stomach  and bowel are grossly unremarkable. An endograft stent is seen in the distal descending and abdominal aorta, incompletely visualized. Infrarenal aorta measures up to 4.0 cm but is incompletely imaged. Musculoskeletal: Degenerative changes in the spine. No worrisome lytic or sclerotic lesions. IMPRESSION: 1. 8 mm apical segment right upper lobe nodule, stable from 06/16/2021 but slightly enlarged from 08/06/2020. Recommended follow-up CT chest in 1 year as adenocarcinoma cannot be excluded. 2. New mid and lower lung zone predominant peribronchovascular nodularity and consolidation with scattered mucoid impaction, findings indicative of an infectious bronchiolitis/bronchopneumonia. 3. Bilateral adrenal adenomas. 4. Partially imaged infrarenal aortic aneurysm with endograft stent in place. 5. Aortic atherosclerosis (ICD10-I70.0). Coronary artery calcification. 6.  Emphysema (ICD10-J43.9). Electronically Signed   By: Leanna Battles M.D.   On: 06/18/2022 08:38   VAS US CAROTID  Result Date: 05/27/2022 Carotid Arterial Duplex Study Patient Name:  Eduard Penkala.  Date of Exam:   05/27/2022 Medical Rec #: [id number]              Accession #:    [id number] Date of Birth: 01-Aug-1940               Patient Gender: M Patient Age:   16 years Exam Location:  [facility] Procedure:      VAS US CAROTID Referring Phys: Cristal Deer DICKSON --------------------------------------------------------------------------------  Indications:       Bilateral endarterectomies. Risk Factors:      Hypertension, hyperlipidemia, coronary artery disease. Comparison Study:  04/02/21: Patent bilateral CEA. Bilateral turbulent subclavian                    arteries and atypical vertebral waveforms. Performing Technologist: Thereasa Parkin RVT  Examination Guidelines: A complete evaluation includes B-mode imaging, spectral Doppler, color Doppler, and power Doppler as needed of all accessible portions of each vessel. Bilateral  testing is considered an integral part of a complete examination. Limited examinations for reoccurring indications may be performed as noted.  Right Carotid  Findings: +----------+--------+--------+--------+------------------+--------+           PSV cm/sEDV cm/sStenosisPlaque DescriptionComments +----------+--------+--------+--------+------------------+--------+ CCA Prox  72      7               heterogenous               +----------+--------+--------+--------+------------------+--------+ CCA Mid   61      13                                         +----------+--------+--------+--------+------------------+--------+ CCA Distal51      7                                          +----------+--------+--------+--------+------------------+--------+ ICA Prox  71      16      Normal                             +----------+--------+--------+--------+------------------+--------+ ICA Mid   92      17                                         +----------+--------+--------+--------+------------------+--------+ ICA Distal122     23                                         +----------+--------+--------+--------+------------------+--------+ ECA       72      0                                          +----------+--------+--------+--------+------------------+--------+ +----------+--------+-------+---------+-------------------+           PSV cm/sEDV cmsDescribe Arm Pressure (mmHG) +----------+--------+-------+---------+-------------------+ Subclavian310     2      Turbulent130                 +----------+--------+-------+---------+-------------------+ +---------+--------+--+--------+--+--------+ VertebralPSV cm/s43EDV cm/s11Atypical +---------+--------+--+--------+--+--------+  Left Carotid Findings: +----------+--------+--------+--------+------------------+--------+           PSV cm/sEDV cm/sStenosisPlaque DescriptionComments  +----------+--------+--------+--------+------------------+--------+ CCA Prox  67      4                                          +----------+--------+--------+--------+------------------+--------+ CCA Mid   89      11              heterogenous               +----------+--------+--------+--------+------------------+--------+ CCA Distal77      13                                         +----------+--------+--------+--------+------------------+--------+ ICA Prox  82      12      Normal                             +----------+--------+--------+--------+------------------+--------+  ICA Mid   125     25                                         +----------+--------+--------+--------+------------------+--------+ ICA Distal81      20                                         +----------+--------+--------+--------+------------------+--------+ ECA       64      0                                          +----------+--------+--------+--------+------------------+--------+ +----------+--------+--------+---------+-------------------+           PSV cm/sEDV cm/sDescribe Arm Pressure (mmHG) +----------+--------+--------+---------+-------------------+ [id number]     0       Turbulent135                 +----------+--------+--------+---------+-------------------+ +---------+--------+--+--------+-+----------------------------------+ VertebralPSV cm/s66EDV cm/s9Atypical and Systolic deceleration +---------+--------+--+--------+-+----------------------------------+   Summary:   *See table(s) above for measurements and observations.  Electronically signed by Coral Else MD on 05/27/2022 at 10:07:09 AM.    Final    VAS [place] EVAR DUPLEX  Result Date: 05/27/2022 Endovascular Aortic Repair Study (EVAR) Patient Name:  Wyndham Santilli.  Date of Exam:   05/27/2022 Medical Rec #: [id number]              Accession #:    [id number] Date of Birth: [birth year]               Patient  Gender: M Patient Age:   41 years Exam Location:  [facility] Procedure:      VAS [place] EVAR DUPLEX Referring Phys: Cristal Deer DICKSON --------------------------------------------------------------------------------  Indications: Follow up exam for EVAR. Risk Factors: Hypertension, hyperlipidemia, coronary artery disease.  Comparison Study: 04/02/21: 3.11 x 3.37 cm Performing Technologist: Thereasa Parkin RVT  Examination Guidelines: A complete evaluation includes B-mode imaging, spectral Doppler, color Doppler, and power Doppler as needed of all accessible portions of each vessel. Bilateral testing is considered an integral part of a complete examination. Limited examinations for reoccurring indications may be performed as noted.  Endovascular Aortic Repair (EVAR): +----------+----------------+-------------------+-------------------+           Diameter AP (cm)Diameter Trans (cm)Velocities (cm/sec) +----------+----------------+-------------------+-------------------+ Aorta     3.72            4.18               38                  +----------+----------------+-------------------+-------------------+ Right Limb1.36            1.13               177                 +----------+----------------+-------------------+-------------------+ Left Limb 1.10            1.05               77                  +----------+----------------+-------------------+-------------------+  Summary: Abdominal Aorta: Patent endovascular aneurysm repair with no evidence of endoleak.  *See table(s)  above for measurements and observations.  Electronically signed by Coral Else MD on 05/27/2022 at 10:04:55 AM.   Final     ASSESSMENT: This is a very pleasant 82 years old white male with persistent anemia and multiple medical conditions.  His anemia is likely anemia of chronic disease plus iron deficiency secondary to gastrointestinal blood loss in a patient with history of colon polyp and has been on treatment  with Plavix.  He has no improvement on the oral iron tablets.   PLAN: I had a lengthy discussion with the patient and his wife today about his current condition and treatment options. I ordered several studies today including repeat CBC that showed hemoglobin of 8.5 and hematocrit 28.3 with MCV of 86.5.  Iron studies showed low serum iron of 22 with iron saturation of 6%.  Vitamin B12, serum folate and ferritin are still pending. I recommended for the patient to proceed with iron infusion with Venofer 300 Mg IV weekly for 3 weeks at the [facility]. He will continue on the oral iron tablets for now. I will see him back for follow-up visit in 3 months for evaluation with repeat CBC, iron study and ferritin. The patient was advised to call immediately if he has any other concerning symptoms in the interval.  The patient voices understanding of current disease status and treatment options and is in agreement with the current care plan.  All questions were answered. The patient knows to call the clinic with any problems, questions or concerns. We can certainly see the patient much sooner if necessary.  Thank you so much for allowing me to participate in the care of Keshan L [employer].. I will continue to follow up the patient with you and assist in his care.  The total time spent in the appointment was 60 minutes.  Disclaimer: This note was dictated with voice recognition software. Similar sounding words can inadvertently be transcribed and may not be corrected upon review.   Lajuana Matte June 23, 2022, 11:46 AM

## 2022-06-24 ENCOUNTER — Telehealth: Payer: Self-pay | Admitting: Pharmacy Technician

## 2022-06-24 ENCOUNTER — Other Ambulatory Visit: Payer: Self-pay | Admitting: Internal Medicine

## 2022-06-24 ENCOUNTER — Other Ambulatory Visit: Payer: Self-pay | Admitting: Pharmacy Technician

## 2022-06-24 DIAGNOSIS — K5730 Diverticulosis of large intestine without perforation or abscess without bleeding: Secondary | ICD-10-CM | POA: Diagnosis not present

## 2022-06-24 DIAGNOSIS — I701 Atherosclerosis of renal artery: Secondary | ICD-10-CM | POA: Diagnosis not present

## 2022-06-24 DIAGNOSIS — I7142 Juxtarenal abdominal aortic aneurysm, without rupture: Secondary | ICD-10-CM | POA: Diagnosis not present

## 2022-06-24 DIAGNOSIS — Z95828 Presence of other vascular implants and grafts: Secondary | ICD-10-CM | POA: Diagnosis not present

## 2022-06-24 NOTE — Telephone Encounter (Addendum)
Dr. Arbutus Ped,  Medicare will not cover venofer with Dx code D50.0. Patient has Medicare A/B and [employer] supp. Monoferric will be covered at 100% x1 infusion. Would you like to use monoferric?

## 2022-06-28 ENCOUNTER — Other Ambulatory Visit: Payer: Self-pay | Admitting: Physician Assistant

## 2022-06-29 ENCOUNTER — Other Ambulatory Visit: Payer: Self-pay | Admitting: Medical Oncology

## 2022-06-29 ENCOUNTER — Telehealth: Payer: Self-pay | Admitting: Medical Oncology

## 2022-06-29 ENCOUNTER — Other Ambulatory Visit: Payer: Self-pay

## 2022-06-29 NOTE — Telephone Encounter (Signed)
Iron infusions- Wife notified to expect a call from [facility] for iron infusion appts.  04/25-appt for Retina repair . He will have to lay face down and if he has to get up he has to look down x 1 week .

## 2022-07-01 DIAGNOSIS — I110 Hypertensive heart disease with heart failure: Secondary | ICD-10-CM | POA: Diagnosis not present

## 2022-07-01 DIAGNOSIS — J449 Chronic obstructive pulmonary disease, unspecified: Secondary | ICD-10-CM | POA: Diagnosis not present

## 2022-07-01 DIAGNOSIS — I5022 Chronic systolic (congestive) heart failure: Secondary | ICD-10-CM | POA: Diagnosis not present

## 2022-07-01 DIAGNOSIS — Z01818 Encounter for other preprocedural examination: Secondary | ICD-10-CM | POA: Diagnosis not present

## 2022-07-01 DIAGNOSIS — E785 Hyperlipidemia, unspecified: Secondary | ICD-10-CM | POA: Diagnosis not present

## 2022-07-01 DIAGNOSIS — K219 Gastro-esophageal reflux disease without esophagitis: Secondary | ICD-10-CM | POA: Diagnosis not present

## 2022-07-01 DIAGNOSIS — I2510 Atherosclerotic heart disease of native coronary artery without angina pectoris: Secondary | ICD-10-CM | POA: Diagnosis not present

## 2022-07-01 DIAGNOSIS — I255 Ischemic cardiomyopathy: Secondary | ICD-10-CM | POA: Diagnosis not present

## 2022-07-02 ENCOUNTER — Ambulatory Visit (INDEPENDENT_AMBULATORY_CARE_PROVIDER_SITE_OTHER): Payer: Medicare Other | Admitting: *Deleted

## 2022-07-02 VITALS — BP 168/56 | HR 58 | Temp 98.1°F | Resp 20 | Ht 66.0 in | Wt 114.0 lb

## 2022-07-02 DIAGNOSIS — K922 Gastrointestinal hemorrhage, unspecified: Secondary | ICD-10-CM

## 2022-07-02 DIAGNOSIS — D500 Iron deficiency anemia secondary to blood loss (chronic): Secondary | ICD-10-CM

## 2022-07-02 MED ORDER — SODIUM CHLORIDE 0.9 % IV SOLN
1000.0000 mg | Freq: Once | INTRAVENOUS | Status: AC
  Administered 2022-07-02: 1000 mg via INTRAVENOUS
  Filled 2022-07-02: qty 10

## 2022-07-02 MED ORDER — DIPHENHYDRAMINE HCL 50 MG/ML IJ SOLN
25.0000 mg | Freq: Once | INTRAMUSCULAR | Status: AC
  Administered 2022-07-02: 25 mg via INTRAVENOUS
  Filled 2022-07-02: qty 1

## 2022-07-02 MED ORDER — ACETAMINOPHEN 325 MG PO TABS
650.0000 mg | ORAL_TABLET | Freq: Once | ORAL | Status: AC
  Administered 2022-07-02: 650 mg via ORAL
  Filled 2022-07-02: qty 2

## 2022-07-02 NOTE — Progress Notes (Signed)
Diagnosis: Iron Deficiency Anemia  Provider:  Chilton Greathouse MD  Procedure: Infusion  IV Type: Peripheral, IV Location: L Antecubital  Monoferric (Ferric Derisomaltose), Dose: 1000 mg  Infusion Start Time: 1338  Infusion Stop Time: 1401  Post Infusion IV Care: Observation period completed and Peripheral IV Discontinued  Discharge: Condition: Good, Destination: Home . AVS Provided  Performed by:  Evelena Peat, RN

## 2022-07-07 ENCOUNTER — Telehealth: Payer: Self-pay

## 2022-07-07 DIAGNOSIS — I2510 Atherosclerotic heart disease of native coronary artery without angina pectoris: Secondary | ICD-10-CM | POA: Diagnosis not present

## 2022-07-07 DIAGNOSIS — H35341 Macular cyst, hole, or pseudohole, right eye: Secondary | ICD-10-CM | POA: Diagnosis not present

## 2022-07-07 DIAGNOSIS — Z79899 Other long term (current) drug therapy: Secondary | ICD-10-CM | POA: Diagnosis not present

## 2022-07-07 DIAGNOSIS — Z885 Allergy status to narcotic agent status: Secondary | ICD-10-CM | POA: Diagnosis not present

## 2022-07-07 DIAGNOSIS — Z7982 Long term (current) use of aspirin: Secondary | ICD-10-CM | POA: Diagnosis not present

## 2022-07-07 DIAGNOSIS — N400 Enlarged prostate without lower urinary tract symptoms: Secondary | ICD-10-CM | POA: Diagnosis not present

## 2022-07-07 DIAGNOSIS — I5022 Chronic systolic (congestive) heart failure: Secondary | ICD-10-CM | POA: Diagnosis not present

## 2022-07-07 DIAGNOSIS — F17210 Nicotine dependence, cigarettes, uncomplicated: Secondary | ICD-10-CM | POA: Diagnosis not present

## 2022-07-07 DIAGNOSIS — Z86010 Personal history of colonic polyps: Secondary | ICD-10-CM | POA: Diagnosis not present

## 2022-07-07 DIAGNOSIS — E785 Hyperlipidemia, unspecified: Secondary | ICD-10-CM | POA: Diagnosis not present

## 2022-07-07 DIAGNOSIS — J449 Chronic obstructive pulmonary disease, unspecified: Secondary | ICD-10-CM | POA: Diagnosis not present

## 2022-07-07 DIAGNOSIS — I110 Hypertensive heart disease with heart failure: Secondary | ICD-10-CM | POA: Diagnosis not present

## 2022-07-07 DIAGNOSIS — I255 Ischemic cardiomyopathy: Secondary | ICD-10-CM | POA: Diagnosis not present

## 2022-07-07 DIAGNOSIS — Z7902 Long term (current) use of antithrombotics/antiplatelets: Secondary | ICD-10-CM | POA: Diagnosis not present

## 2022-07-07 NOTE — Progress Notes (Signed)
Care Management & Coordination Services Pharmacy Team  Reason for Encounter: Appointment Reminder  Contacted patient to confirm telephone appointment with Al Corpus, PharmD on 07/12/2022 at 3:00.  Unsuccessful outreach. Left voicemail for patient to return call.  Star Rating Drugs:  Medication:  Last Fill: Day Supply Ezetimibe-Simvastatin 10-40 mg 04/08/22  90ds  Care Gaps: Annual wellness visit in last year? Yes 07/14/21  Al Corpus, PharmD notified  Claudina Lick, RMA Clinical Pharmacy Assistant [phone]

## 2022-07-09 ENCOUNTER — Other Ambulatory Visit: Payer: Self-pay | Admitting: Family Medicine

## 2022-07-12 ENCOUNTER — Ambulatory Visit: Payer: Medicare Other | Admitting: Pharmacist

## 2022-07-12 ENCOUNTER — Other Ambulatory Visit: Payer: Self-pay | Admitting: Medical Oncology

## 2022-07-12 NOTE — Progress Notes (Signed)
I told wife pt received monoferric per his insurance and he does not need anymore doses right now.   I explained the difference between this and feraheme.

## 2022-07-12 NOTE — Progress Notes (Signed)
Care Management & Coordination Services Pharmacy Note  07/12/2022 Name:  Erik [name]. MRN:  [id number] DOB:  Mar 31, 1940  Summary: F/U visit  -HF: pt reports compliance with medications and denies issues; he has been off loop diuretic for several months and weight is stable; BP has been elevated in office visits  -RLS / iron deficiency: reviewed importance of iron repletion for improving RLS symptoms, pt has received 1 iron infusion 4/19 and will follow up with hematology in July  Recommendations/Changes made from today's visit: -Advised to monitor daily weight and call cardiology with wt gain 2+ lbs overnight or 5+ lbs in a week -F/u with GI as scheduled  Follow up plan: -Health Concierge will call patient 1 month for BP update -Pharmacist follow up televisit scheduled for 6 months -PCP appt 08/26/22; Hematology 09/22/22    Subjective: Erik [name]. is an 82 y.o. year old male who is a primary patient of Bedsole, Amy E, MD.  The care coordination team was consulted for assistance with disease management and care coordination needs.    Engaged with patient by telephone for follow up visit.  Recent office visits: 05/25/22 Dr Ermalene Searing OV: f/u - RLS uncontrolled. D/C ropinrole, trial gabapentin for RLS. Trial off Remeron, monitor appetite. HGB 8.8, TSAT 5; advised increase iron to 2 tablets and f/u with GI.  02/23/22 Dr Ermalene Searing OV: epistaxis - resolved. Labs stable. Entresto removed from med list.  11/03/21 Dr Ermalene Searing OV: weight loss. No benefit from mirtazapine. C/o epigastric pain - unclear etiology. Advise f/u with GI.   Recent consult visits: 06/23/22 Dr Arbutus Ped (Hematology): IDA - proceed with iron infusion, continue oral iron. F/u 3 months.   05/27/22 PA Eveland (Vascular): f/u carotid stenosis. No interest smoking cessation.  03/31/22 HF Clinic: switch spironolactone to AM.   03/18/22 Dr Drue Second (ID): f/u MSSA bacteremia. Resolved. F/U PRN.  11/27/21 Dr Shirlee Latch (HF  clinic): f/u HF. Start Antoine. Change Toprol and Spironolactone to nightly.  Hospital visits: 03/09/22 ED visit [facility]): testicular pain - [place] w/ small hydrocele. Pain resolved w/ Percocet. Discharged.   02/15/22 - 12/923 admission [facility]) epistaxis, anemia. Held aspirin and plavix. Gave 2 u PRBC, HGB improved 6.8 to 8.6. Restart ASA and plavix 12/10.   02/05/22 ED visit [facility]): acute CHF (Lasix was not restarted after recent hospitalization in s/o hypotension). Restart furosemide 20 mg daily, weigh daily.  01/29/22 - 02/03/22 Admission [facility]): MSSA bacteremia. IV cefazolin through 02/28/22. D/C Entresto.   Objective:  Lab Results  Component Value Date   CREATININE 0.99 06/23/2022   BUN 15 06/23/2022   GFR 60.66 02/23/2022   GFRNONAA >60 06/23/2022   GFRAA 95 08/18/2018   NA 137 06/23/2022   K 3.8 06/23/2022   CALCIUM 8.8 (L) 06/23/2022   CO2 29 06/23/2022   GLUCOSE 134 (H) 06/23/2022    Lab Results  Component Value Date/Time   HGBA1C 6.0 07/07/2021 09:51 AM   HGBA1C 5.6 07/03/2020 09:06 AM   GFR 60.66 02/23/2022 04:21 PM   GFR 81.19 07/07/2021 09:51 AM    Last diabetic Eye exam: No results found for: "HMDIABEYEEXA"  Last diabetic Foot exam: No results found for: "HMDIABFOOTEX"   Lab Results  Component Value Date   CHOL 113 07/07/2021   HDL 49.10 07/07/2021   LDLCALC 44 07/07/2021   LDLDIRECT 176.9 05/09/2008   TRIG 102.0 07/07/2021   CHOLHDL 2 07/07/2021       Latest Ref Rng & Units 06/23/2022   11:04 AM 03/09/2022  8:12 AM 02/23/2022    4:21 PM  Hepatic Function  Total Protein 6.5 - 8.1 g/dL 7.5  7.9  7.1   Albumin 3.5 - 5.0 g/dL 3.4  2.8  3.0   AST 15 - 41 U/L 13  19  16    ALT 0 - 44 U/L 9  9  1    Alk Phosphatase 38 - 126 U/L 62  55  67   Total Bilirubin 0.3 - 1.2 mg/dL 0.3  0.2  0.2     Lab Results  Component Value Date/Time   TSH 1.75 10/09/2021 04:46 PM   TSH 1.178 02/05/2015 05:10 PM       Latest Ref Rng & Units 06/23/2022   11:04 AM 05/25/2022    10:53 AM 03/09/2022    8:12 AM  CBC  WBC 4.0 - 10.5 K/uL 6.9  9.3  11.8   Hemoglobin 13.0 - 17.0 g/dL 8.5  8.8 Repeated and verified X2.  10.4   Hematocrit 39.0 - 52.0 % 28.3  27.4  33.5   Platelets 150 - 400 K/uL 264  475.0  304    Iron/TIBC/Ferritin/ %Sat    Component Value Date/Time   IRON 22 (L) 06/23/2022 1104   TIBC 353 06/23/2022 1104   FERRITIN 21 (L) 06/23/2022 1105   IRONPCTSAT 6 (L) 06/23/2022 1104   IRONPCTSAT 11 (L) 10/09/2021 1646    Lab Results  Component Value Date/Time   VITAMINB12 1,005 (H) 06/23/2022 11:04 AM   VITAMINB12 536 11/03/2021 11:54 AM    Clinical ASCVD: Yes  The ASCVD Risk score (Arnett DK, et al., 2019) failed to calculate for the following reasons:   The 2019 ASCVD risk score is only valid for ages 26 to 43   The patient has a prior MI or stroke diagnosis        05/25/2022   10:23 AM 03/18/2022    2:34 PM 07/14/2021    9:44 AM  Depression screen PHQ 2/9  Decreased Interest 0 0 0  Down, Depressed, Hopeless 0 0 0  PHQ - 2 Score 0 0 0     Social History   Tobacco Use  Smoking Status Every Day   Packs/day: 1.00   Years: 50.00   Additional pack years: 0.00   Total pack years: 50.00   Types: Cigarettes   Passive exposure: Never  Smokeless Tobacco Current  Tobacco Comments   off and on since age 82, currently smokes a pack a day [facility] 12/23/20   BP Readings from Last 3 Encounters:  07/02/22 (!) 168/56  06/23/22 (!) 144/50  05/27/22 (!) 159/51   Pulse Readings from Last 3 Encounters:  07/02/22 (!) 58  06/23/22 65  05/27/22 60   Wt Readings from Last 3 Encounters:  07/02/22 114 lb (51.7 kg)  06/23/22 117 lb 1.6 oz (53.1 kg)  05/27/22 114 lb 6.4 oz (51.9 kg)   BMI Readings from Last 3 Encounters:  07/02/22 18.40 kg/m  06/23/22 18.38 kg/m  05/27/22 18.46 kg/m    Allergies  Allergen Reactions   Codeine Anaphylaxis and Swelling    throat swells    Medications Reviewed Today     Reviewed by Kathyrn Sheriff, RPH  (Pharmacist) on 07/12/22 at 1520  Med List Status: <None>   Medication Order Taking? Sig Documenting Provider Last Dose Status Informant  acetaminophen (TYLENOL) 500 MG tablet [id number] Yes Take 1,000 mg by mouth at bedtime. [provider] Taking Active Self, Spouse/Significant Other, Pharmacy Records  Med Note Tiburcio Pea, Luvenia Redden   Tue Oct 03, 2018 10:05 AM)    aspirin EC 81 MG tablet [id number] Yes Take 81 mg by mouth every evening.  [provider] Taking Active Self, Spouse/Significant Other, Pharmacy Records  clopidogrel (PLAVIX) 75 MG tablet [id number] Yes TAKE 1 TABLET BY MOUTH EVERY DAY Laurey Morale, MD Taking Active Self, Spouse/Significant Other, Pharmacy Records  cyanocobalamin (VITAMIN B12) 1000 MCG tablet [id number] Yes Take 1,000 mcg by mouth daily. [provider] Taking Active Self, Spouse/Significant Other, Pharmacy Records  dapagliflozin propanediol (FARXIGA) 10 MG TABS tablet [id number] Yes Take 1 tablet (10 mg total) by mouth daily before breakfast. Laurey Morale, MD Taking Active Self, Spouse/Significant Other, Pharmacy Records  ezetimibe-simvastatin (VYTORIN) 10-40 MG tablet [id number] Yes TAKE 1 TABLET BY MOUTH EVERYDAY AT BEDTIME Bedsole, Amy E, MD Taking Active   famotidine (PEPCID) 40 MG tablet [id number] Yes Take 40 mg by mouth 2 (two) times daily. [provider] Taking Active   ferrous sulfate 325 (65 FE) MG tablet [id number] Yes Take 325 mg by mouth daily with breakfast. [provider] Taking Active Self, Spouse/Significant Other, Pharmacy Records  gabapentin (NEURONTIN) 100 MG capsule [id number] Yes TAKE 1 CAPSULE BY MOUTH AT BEDTIME. Excell Seltzer, MD Taking Active   methocarbamol (ROBAXIN) 500 MG tablet [id number] Yes TAKE 1 TABLET (500 MG TOTAL) BY MOUTH EVERY DAY AT BEDTIME AS NEEDED FOR MUSCLE SPASM Bedsole, Amy E, MD Taking Active   metoprolol succinate (TOPROL-XL) 50 MG 24 hr tablet [id number] Yes TAKE 1 TABLET  BY MOUTH EVERY DAY WITH OR IMMEDIATELY FOLLOWING MEAL (NEEDS OFFICE VISIT) Laurey Morale, MD Taking Active   mirtazapine (REMERON) 15 MG tablet [id number] No Take 15 mg by mouth at bedtime.  Patient not taking: Reported on 07/12/2022   [provider] Not Taking Active Self, Spouse/Significant Other, Pharmacy Records  Omega-3 Fatty Acids (FISH OIL) 1000 MG CAPS [id number] Yes Take 1 capsule by mouth daily. [provider] Taking Active Self, Spouse/Significant Other, Pharmacy Records  spironolactone (ALDACTONE) 25 MG tablet [id number] Yes TAKE 1/2 TABLET BY MOUTH EVERY DAY Laurey Morale, MD Taking Active Self, Spouse/Significant Other, Pharmacy Records  tamsulosin (FLOMAX) 0.4 MG CAPS capsule [id number] Yes TAKE 1 CAPSULE BY MOUTH EVERY DAY Bedsole, Amy E, MD Taking Active             SDOH:  (Social Determinants of Health) assessments and interventions performed: No SDOH Interventions    Flowsheet Row Telephone from 02/22/2022 in [facility] Community Care Coordination Chronic Care Management from 04/29/2021 in [facility] Clinical Support from 05/31/2019 in [facility] [place]  SDOH Interventions     Food Insecurity Interventions Intervention Not Indicated Intervention Not Indicated --  Housing Interventions Intervention Not Indicated -- --  Transportation Interventions Intervention Not Indicated -- --  Depression Interventions/Treatment  -- -- OHY0-7 Score <4 Follow-up Not Indicated  Financial Strain Interventions -- Intervention Not Indicated --       Medication Assistance:  -Pt is able to use $10 copay card for [facility]  Medication Access: Within the past 30 days, how often has patient missed a dose of medication? 0 Is a pillbox or other method used to improve adherence? Yes  Factors that may affect medication adherence? lack of understanding of disease management Are meds synced by  current pharmacy? No  Are meds delivered by current pharmacy? No  Does patient experience delays in picking up  medications due to transportation concerns? No   [facility] Services Reviewed: Is patient disadvantaged to use [facility]?: Yes  Current Rx insurance plan: GEHA Name and location of Current pharmacy:  [facility] [phone] - [place], [place] - [address] [place] [address] Phone: [phone] Fax: [phone]  [facility] services reviewed with patient today?: No  Patient requests to transfer care to [facility]?: No  Reason patient declined to change pharmacies: Disadvantaged due to insurance/mail order  Compliance/Adherence/Medication fill history: Care Gaps: None  Star-Rating Drugs: Ezetimibe-simvastatin - PDC   Assessment/Plan  Heart Failure / HTN (Goal: BP < 140/90, and prevent exacerbations) -Controlled-  pt has not been taking furosemide, he states he was told Marcelline Deist replaces furosemide; he reports wt is stable at home -Last ejection fraction: 50% (Date: 01/2022) (previously 20-25% in 08/2013) -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 -Current home BP/HR readings: 120/50 -Daily weight: 107# -Current treatment: Metoprolol succinate 50 mg daily HS -Appropriate, Effective, Safe, Accessible Spironolactone 25 mg - 1/2 tab daily AM -Appropriate, Effective, Safe, Accessible Farxiga 10 mg daily - Appropriate, Effective, Safe, Accessible -Medications previously tried: lisinopril, Entresto, furosemide -Educated on Benefits of medications for managing symptoms and prolonging life;  Importance of blood pressure control; -Educated on Importance of weighing daily; if you gain more than 3 pounds in one day or 5 pounds in one week, contact cardiology -Recommended to continue current medication  Hyperlipidemia / CAD (LDL goal < 70) -Controlled - LDL 44 (06/2021) at goal; pt endorses compliance with medications  as prescribed; he denies bleeding issues; he has never had to use NTG -Hx NSTEMI 2015, intermittent claudication, bilateral carotid artery stenosis -Current treatment: Ezetimibe-simvastatin 10-40 mg daily HS - Appropriate, Effective, Safe, Accessible Nitroglycerin 0.4 mg SL prn -Appropriate, Effective, Safe, Accessible Aspirin 81 mg daily -Appropriate, Effective, Safe, Accessible Clopidogrel 75 mg daily -Appropriate, Effective, Safe, Accessible OTC fish oil 1000 mg daily -Appropriate, Effective, Safe, Accessible -Current dietary patterns: "whatever my wife makes" -Current exercise habits: walking, yard work -Educated on [employer]; Benefits of statin for ASCVD risk reduction; benefits of antiplatelet therapy for maintaining stent patency -Recommended to continue current medication  RLS (Goal: manage symptoms) -Not ideally controlled -Pt received 1 iron infusion 4/19, following up with GI in July -Current treatment  Gabapentin 100 mg HS - Appropriate, Effective, Safe, Accessible Ferrous sulfate 325 mg daily -Appropriate, Effective, Safe, Accessible -Medications previously tried: ropinirole -Reviewed importance of iron repletion to treat RLS, among other reasons -Recommend to continue current medication  Poor appetite  -Not ideally controlled - pt reports apeptite somewhat improved, he did gain some weight (nadir 107#, 122# 1 year ago, now 114#) -Current treatment  Mirtazapine 15 mg daily HS - on hold -Medications previously tried: n/a  -Discussed possibility of using Boost/Ensure to supplement diet -Recommended to continue current medication  Tobacco use (Goal: quit smoking) -Uncontrolled - pt is not ready quit; currently smoking ~1 ppd -Previous quit attempts: Chantix (recalled), bupropion (dry mouth) -Counseled on risks of continued smoking and benefits of cessation; offered help if patient changes his mind in the future  Health Maintenance -Vaccine gaps: Shingrix, covid  booster, TDAP -GERD: on famotidine; hx PPI use -Counseled on Covid booster, Shingrix. Advised he get vaccines at local pharmacy. -4 Hospital/ED visits Nov-Dec 2023 for sepsis, acute HF, epistaxis, and testicular pain; reviewed notes and discussed importance of self-monitoring and contacting providers with new/worsening symptoms   Al Corpus, PharmD, [facility] Clinical Pharmacist [place]  [facility] at [place]

## 2022-07-13 ENCOUNTER — Encounter: Payer: Self-pay | Admitting: Physician Assistant

## 2022-07-22 ENCOUNTER — Other Ambulatory Visit: Payer: Self-pay | Admitting: Family Medicine

## 2022-07-22 NOTE — Telephone Encounter (Signed)
Last office visit 05/25/2022 chronic issues.  Last refilled 06/22/2022 for #30 with no refills.  Next Appt: CPE 08/26/2022.

## 2022-07-30 ENCOUNTER — Other Ambulatory Visit: Payer: Self-pay | Admitting: Family Medicine

## 2022-08-03 ENCOUNTER — Telehealth: Payer: Self-pay | Admitting: *Deleted

## 2022-08-03 DIAGNOSIS — E785 Hyperlipidemia, unspecified: Secondary | ICD-10-CM

## 2022-08-03 DIAGNOSIS — R7303 Prediabetes: Secondary | ICD-10-CM

## 2022-08-03 NOTE — Telephone Encounter (Signed)
-----   Message from Alvina Chou sent at 08/02/2022  3:36 PM EDT ----- Regarding: Lab orders for Thursday, 6.6.24 Patient is scheduled for CPX labs, please order future labs, Thanks , Camelia Eng

## 2022-08-10 DIAGNOSIS — H35341 Macular cyst, hole, or pseudohole, right eye: Secondary | ICD-10-CM | POA: Diagnosis not present

## 2022-08-19 ENCOUNTER — Other Ambulatory Visit (INDEPENDENT_AMBULATORY_CARE_PROVIDER_SITE_OTHER): Payer: Medicare Other

## 2022-08-19 DIAGNOSIS — R7303 Prediabetes: Secondary | ICD-10-CM | POA: Diagnosis not present

## 2022-08-19 DIAGNOSIS — E785 Hyperlipidemia, unspecified: Secondary | ICD-10-CM

## 2022-08-19 LAB — COMPREHENSIVE METABOLIC PANEL
ALT: 9 U/L (ref 0–53)
AST: 17 U/L (ref 0–37)
Albumin: 3.5 g/dL (ref 3.5–5.2)
Alkaline Phosphatase: 60 U/L (ref 39–117)
BUN: 16 mg/dL (ref 6–23)
CO2: 27 mEq/L (ref 19–32)
Calcium: 8.7 mg/dL (ref 8.4–10.5)
Chloride: 103 mEq/L (ref 96–112)
Creatinine, Ser: 1 mg/dL (ref 0.40–1.50)
GFR: 70.75 mL/min (ref >60.00)
Glucose, Bld: 84 mg/dL (ref 70–99)
Potassium: 4.8 mEq/L (ref 3.5–5.1)
Sodium: 136 mEq/L (ref 135–145)
Total Bilirubin: 0.3 mg/dL (ref 0.2–1.2)
Total Protein: 7.4 g/dL (ref 6.0–8.3)

## 2022-08-19 LAB — LIPID PANEL
Cholesterol: 90 mg/dL (ref 0–200)
HDL: 46.2 mg/dL (ref >39.00)
LDL Cholesterol: 28 mg/dL (ref 0–99)
NonHDL: 44.27
Total CHOL/HDL Ratio: 2
Triglycerides: 81 mg/dL (ref 0.0–149.0)
VLDL: 16.2 mg/dL (ref 0.0–40.0)

## 2022-08-19 LAB — HEMOGLOBIN A1C: Hgb A1c MFr Bld: 4.5 % — ABNORMAL LOW (ref 4.6–6.5)

## 2022-08-19 NOTE — Progress Notes (Signed)
No critical labs need to be addressed urgently. We will discuss labs in detail at upcoming office visit.   

## 2022-08-26 ENCOUNTER — Encounter: Payer: Self-pay | Admitting: Family Medicine

## 2022-08-26 ENCOUNTER — Ambulatory Visit (INDEPENDENT_AMBULATORY_CARE_PROVIDER_SITE_OTHER): Payer: Medicare Other | Admitting: Family Medicine

## 2022-08-26 VITALS — BP 158/54 | HR 61 | Temp 97.4°F | Ht 63.25 in | Wt 114.4 lb

## 2022-08-26 DIAGNOSIS — E785 Hyperlipidemia, unspecified: Secondary | ICD-10-CM | POA: Diagnosis not present

## 2022-08-26 DIAGNOSIS — N138 Other obstructive and reflux uropathy: Secondary | ICD-10-CM

## 2022-08-26 DIAGNOSIS — I5022 Chronic systolic (congestive) heart failure: Secondary | ICD-10-CM

## 2022-08-26 DIAGNOSIS — R7303 Prediabetes: Secondary | ICD-10-CM

## 2022-08-26 DIAGNOSIS — I10 Essential (primary) hypertension: Secondary | ICD-10-CM

## 2022-08-26 DIAGNOSIS — R9389 Abnormal findings on diagnostic imaging of other specified body structures: Secondary | ICD-10-CM | POA: Insufficient documentation

## 2022-08-26 DIAGNOSIS — J479 Bronchiectasis, uncomplicated: Secondary | ICD-10-CM | POA: Diagnosis not present

## 2022-08-26 DIAGNOSIS — Z0000 Encounter for general adult medical examination without abnormal findings: Secondary | ICD-10-CM

## 2022-08-26 DIAGNOSIS — J449 Chronic obstructive pulmonary disease, unspecified: Secondary | ICD-10-CM | POA: Diagnosis not present

## 2022-08-26 DIAGNOSIS — I6523 Occlusion and stenosis of bilateral carotid arteries: Secondary | ICD-10-CM

## 2022-08-26 DIAGNOSIS — G2581 Restless legs syndrome: Secondary | ICD-10-CM

## 2022-08-26 DIAGNOSIS — I714 Abdominal aortic aneurysm, without rupture, unspecified: Secondary | ICD-10-CM

## 2022-08-26 DIAGNOSIS — D508 Other iron deficiency anemias: Secondary | ICD-10-CM

## 2022-08-26 DIAGNOSIS — R911 Solitary pulmonary nodule: Secondary | ICD-10-CM

## 2022-08-26 DIAGNOSIS — N401 Enlarged prostate with lower urinary tract symptoms: Secondary | ICD-10-CM

## 2022-08-26 MED ORDER — GABAPENTIN 100 MG PO CAPS: 100.0000 mg | ORAL_CAPSULE | Freq: Every day | ORAL | 3 refills | Status: DC

## 2022-08-26 NOTE — Assessment & Plan Note (Signed)
Status post CEA.  Followed by vascular. ? ?LDL goal less than 70. ?

## 2022-08-26 NOTE — Assessment & Plan Note (Signed)
Chronic, blood pressure above goal in office today  Entresto 24/26 twice daily, Toprol XL 25 mg daily, spironolactone 12.5 mg daily.

## 2022-08-26 NOTE — Assessment & Plan Note (Signed)
Vascular following AAA, reviewed last OV note from 06/24/2022 Recent CT scan demonstrated intact repair, unchanged from prior.  No recommendations for further intervention. Recommended follow up with Erik [name] in 2 years with repeat CT Endo CAP w/ contrast, carotid duplex studies, and BLE duplex studies

## 2022-08-26 NOTE — Assessment & Plan Note (Signed)
Followed in the past by pulmonary, no recent symptoms.

## 2022-08-26 NOTE — Assessment & Plan Note (Addendum)
Incidental noting of: Bilateral scattered centrilobular nodules in the dependent segments of the right middle, left upper, and bilateral lower lobes, suggestive of chronic aspiration seen on CT.  Of note this was not mentioned on  lung cancer screening CT from 06/2022   Of note I did discuss with patient in detail possible evaluation for chronic aspiration given CT scan findings per one of the CT scans, he states he to has no swallowing difficulty or coughing during eating.  He is not interested in workup for aspiration.

## 2022-08-26 NOTE — Assessment & Plan Note (Signed)
Improved. Lab Results  Component Value Date   HGBA1C 4.5 (L) 08/19/2022

## 2022-08-26 NOTE — Assessment & Plan Note (Addendum)
Chronic stable- He states in the past he has tried different inhaler medications and they have not helped.  He states that shortness of breath does not interfere with his activity and he is not interested in returning to see pulmonary or trial of a different inhaler.

## 2022-08-26 NOTE — Assessment & Plan Note (Signed)
Chronic, inadequate control despite max dose of ropinirole.  He has not noted clear benefit from low-dose gabapentin.  We will try titrating this up over time.

## 2022-08-26 NOTE — Assessment & Plan Note (Signed)
Chronic, well controlled on Vytorin.  LDL at goal less than 70 ?

## 2022-08-26 NOTE — Assessment & Plan Note (Signed)
Chronic,  followed by Dr. Shirline Frees.  Reviewed June 23, 2022 office visit Recommended iron infusion with Venofer 300 Mg IV weekly  Workup started and recommended follow-up in 3 months.

## 2022-08-26 NOTE — Patient Instructions (Addendum)
Follow BP at home.Marland Kitchen at call if > 150/90 consistently  Can try increased dose of gabapentin for RLS.Marland Kitchen try 200 mg at beditme, if not better can increase to 300 mg at bedtime. Can also try  100 mg during the day if no oversedation. Make a change weekly, not faster.

## 2022-08-26 NOTE — Assessment & Plan Note (Addendum)
It appears CT scan from 20 22-20 23 did show increase in size of pulmonary nodule, but this nodule has now been stable between 2023 and 2024.  Agree with plan to recheck in 1 year.  Of note I did discuss with patient in detail possible evaluation for chronic aspiration given CT scan findings per one of the CT scans, he states he to has no swallowing difficulty or

## 2022-08-26 NOTE — Assessment & Plan Note (Signed)
Chronic, euvolemic in office today Followed by cardiology.  Using Lasix 20 mg as needed 

## 2022-08-26 NOTE — Progress Notes (Signed)
Patient ID: [name], male    DOB: 03-Feb-1941, 82 y.o.   MRN: [id number]  This visit was conducted in person.  BP (!) 158/54   Pulse 61   Temp (!) 97.4 F (36.3 C) (Temporal)   Ht 5' 3.25" (1.607 m)   Wt 114 lb 6 oz (51.9 kg)   SpO2 99%   BMI 20.10 kg/m    CC:  Chief Complaint  Patient presents with   Medicare Wellness    Subjective:   HPI: Erik [name]. is a 82 y.o. male presenting on 08/26/2022 for Medicare Wellness  The patient presents for annual medicare wellness, complete physical and review of chronic health problems. He/She also has the following acute concerns today:   I have personally reviewed the Medicare Annual Wellness questionnaire and have noted 1. The patient's medical and social history 2. Their use of alcohol, tobacco or illicit drugs 3. Their current medications and supplements 4. The patient's functional ability including ADL's, fall risks, home safety risks and hearing or visual             impairment. 5. Diet and physical activities 6. Evidence for depression or mood disorders 7.         Updated provider list Cognitive evaluation was performed and recorded on pt medicare questionnaire form. The patients weight, height, BMI and visual acuity have been recorded in the chart   I have made referrals, counseling and provided education to the patient based review of the above and I have provided the pt with a written personalized care plan for preventive services.   Documentation of this information was scanned into the electronic record under the media tab. .  Advance directives and end of life planning reviewed in detail with patient and documented in EMR. Patient given handout on advance care directives if needed. HCPOA and living will updated if needed.  Hearing Screening   500Hz  1000Hz  2000Hz  4000Hz   Right ear 25 40 0 0  Left ear 25 40 0 0  Vision Screening - Comments:: Last eye exam within past yr.   No falls in last 12  months.     08/26/2022    9:38 AM 05/25/2022   10:23 AM 03/18/2022    2:34 PM  Depression screen PHQ 2/9  Decreased Interest 0 0 0  Down, Depressed, Hopeless 0 0 0  PHQ - 2 Score 0 0 0  Altered sleeping 1    Tired, decreased energy 1    Change in appetite 1    Feeling bad or failure about yourself  0    Trouble concentrating 0    Moving slowly or fidgety/restless 0    Suicidal thoughts 0    PHQ-9 Score 3    Difficult doing work/chores Not difficult at all         Weight loss stable Wt Readings from Last 3 Encounters:  08/26/22 114 lb 6 oz (51.9 kg)  07/02/22 114 lb (51.7 kg)  06/23/22 117 lb 1.6 oz (53.1 kg)      Vascular following AAA, reviewed last OV note from 06/24/2022 Recent CT scan demonstrated intact repair, unchanged from prior.  No recommendations for further intervention. Recommended follow up with Erik [name] in 2 years with repeat CT Endo CAP w/ contrast, carotid duplex studies, and BLE duplex studies   Additionally, per CT imaging: - Interval increase in size of right apical pulmonary nodule ( 2022 to 2024), now measuring 0.7 cm, previously 0.5 cm.  Consider dedicated CT chest at 6 to 12 months. - Bilateral scattered centrilobular nodules in the dependent segments of the right middle, left upper, and bilateral lower lobes, suggestive of chronic aspiration.   BUT also reviewed lung CT done for screening .Marland Kitchen Nodule stable from 2023 to 2024..plan repeat in 1 year.  Chronic systolic heart failure: Euvolemic in office today, followed by cardiology.  Using Lasix 20 mg p.o. as needed, spironolactone 12.5 mg p.o. daily Reviewed last office visit with cardiology March 31, 2022.Marland Kitchen  Not clear if he followed up with Dr. Shirlee Latch as recommended.  COPD and bronchiectasis:  No recent pulmonary eval.  Per pt stable breathing issue.. does have wheeze and SOB with walking.  Iron deficiency anemia followed by Dr. Shirline Frees.  Reviewed June 23, 2022 office visit Recommended iron  infusion with Venofer 300 Mg IV weekly  Workup started and recommended follow-up in 3 months.  Elevated Cholesterol: LDL at goal  < 70 given CAD on Vytorin. Using medications without problems: Muscle aches:  Diet compliance: moderate Exercise: minimal Other complaints:  Hypertension:  Not at goal in office today on Entresto 24/26 twice daily, Toprol XL 25 mg daily, spironolactone 12.5 mg daily.  BP Readings from Last 3 Encounters:  08/26/22 (!) 158/54  07/02/22 (!) 168/56  06/23/22 (!) 144/50  Using medication without problems or lightheadedness:  Chest pain with exertion: none Edema: later in the day. Short of breath: stable Average home BPs: Other issues:      Relevant past medical, surgical, family and social history reviewed and updated as indicated. Interim medical history since our last visit reviewed. Allergies and medications reviewed and updated. Outpatient Medications Prior to Visit  Medication Sig Dispense Refill   acetaminophen (TYLENOL) 500 MG tablet Take 1,000 mg by mouth at bedtime.     aspirin EC 81 MG tablet Take 81 mg by mouth every evening.      clopidogrel (PLAVIX) 75 MG tablet TAKE 1 TABLET BY MOUTH EVERY DAY 90 tablet 3   cyanocobalamin (VITAMIN B12) 1000 MCG tablet Take 1,000 mcg by mouth daily.     dapagliflozin propanediol (FARXIGA) 10 MG TABS tablet Take 1 tablet (10 mg total) by mouth daily before breakfast. 30 tablet 11   ezetimibe-simvastatin (VYTORIN) 10-40 MG tablet TAKE 1 TABLET BY MOUTH EVERYDAY AT BEDTIME 90 tablet 0   famotidine (PEPCID) 40 MG tablet Take 40 mg by mouth 2 (two) times daily.     ferrous sulfate 325 (65 FE) MG tablet Take 325 mg by mouth daily with breakfast.     methocarbamol (ROBAXIN) 500 MG tablet TAKE 1 TABLET (500 MG TOTAL) BY MOUTH EVERY DAY AT BEDTIME AS NEEDED FOR MUSCLE SPASM 30 tablet 0   metoprolol succinate (TOPROL-XL) 50 MG 24 hr tablet TAKE 1 TABLET BY MOUTH EVERY DAY WITH OR IMMEDIATELY FOLLOWING MEAL (NEEDS OFFICE  VISIT) 90 tablet 0   mirtazapine (REMERON) 15 MG tablet Take 15 mg by mouth at bedtime.     Omega-3 Fatty Acids (FISH OIL) 1000 MG CAPS Take 1 capsule by mouth daily.     spironolactone (ALDACTONE) 25 MG tablet TAKE 1/2 TABLET BY MOUTH EVERY DAY 45 tablet 3   tamsulosin (FLOMAX) 0.4 MG CAPS capsule TAKE 1 CAPSULE BY MOUTH EVERY DAY 90 capsule 0   gabapentin (NEURONTIN) 100 MG capsule TAKE 1 CAPSULE BY MOUTH AT BEDTIME. 30 capsule 0   No facility-administered medications prior to visit.     Per HPI unless specifically indicated in ROS section below  Review of Systems  Constitutional:  Positive for unexpected weight change. Negative for fatigue and fever.  HENT:  Negative for ear pain.   Eyes:  Negative for pain.  Respiratory:  Negative for cough and shortness of breath.   Cardiovascular:  Negative for chest pain, palpitations and leg swelling.  Gastrointestinal:  Negative for abdominal pain.  Genitourinary:  Negative for dysuria.  Musculoskeletal:  Negative for arthralgias.  Neurological:  Negative for syncope, light-headedness and headaches.  Psychiatric/Behavioral:  Negative for dysphoric mood.    Objective:  BP (!) 158/54   Pulse 61   Temp (!) 97.4 F (36.3 C) (Temporal)   Ht 5' 3.25" (1.607 m)   Wt 114 lb 6 oz (51.9 kg)   SpO2 99%   BMI 20.10 kg/m   Wt Readings from Last 3 Encounters:  08/26/22 114 lb 6 oz (51.9 kg)  07/02/22 114 lb (51.7 kg)  06/23/22 117 lb 1.6 oz (53.1 kg)      Physical Exam Constitutional:      General: He is not in acute distress.    Appearance: Normal appearance. He is well-developed. He is not ill-appearing or toxic-appearing.  HENT:     Head: Normocephalic and atraumatic.     Right Ear: Hearing, tympanic membrane, ear canal and external ear normal.     Left Ear: Hearing, tympanic membrane, ear canal and external ear normal.     Nose: Nose normal.     Mouth/Throat:     Pharynx: Uvula midline.  Eyes:     General: Lids are normal. Lids are  everted, no foreign bodies appreciated.     Conjunctiva/sclera: Conjunctivae normal.     Pupils: Pupils are equal, round, and reactive to light.  Neck:     Thyroid: No thyroid mass or thyromegaly.     Vascular: No carotid bruit.     Trachea: Trachea and phonation normal.  Cardiovascular:     Rate and Rhythm: Normal rate and regular rhythm.     Pulses: Normal pulses.     Heart sounds: S1 normal and S2 normal. No murmur heard.    No gallop.  Pulmonary:     Breath sounds: Normal breath sounds. No wheezing, rhonchi or rales.  Abdominal:     General: Bowel sounds are normal.     Palpations: Abdomen is soft.     Tenderness: There is no abdominal tenderness. There is no guarding or rebound.     Hernia: No hernia is present. There is no hernia in the umbilical area, ventral area, left inguinal area, right femoral area, left femoral area or right inguinal area.  Musculoskeletal:     Cervical back: Normal range of motion and neck supple.  Lymphadenopathy:     Cervical: No cervical adenopathy.  Skin:    General: Skin is warm and dry.     Findings: No rash.  Neurological:     Mental Status: He is alert.     Cranial Nerves: No cranial nerve deficit.     Sensory: No sensory deficit.     Gait: Gait normal.     Deep Tendon Reflexes: Reflexes are normal and symmetric.  Psychiatric:        Speech: Speech normal.        Behavior: Behavior normal.        Judgment: Judgment normal.       Results for orders placed or performed in visit on 08/19/22  Lipid panel  Result Value Ref Range   Cholesterol 90 0 -  200 mg/dL   Triglycerides 16.1 0.0 - 149.0 mg/dL   HDL 09.60 >45.40 mg/dL   VLDL 98.1 0.0 - 19.1 mg/dL   LDL Cholesterol 28 0 - 99 mg/dL   Total CHOL/HDL Ratio 2    NonHDL 44.27   Comprehensive metabolic panel  Result Value Ref Range   Sodium 136 135 - 145 mEq/L   Potassium 4.8 3.5 - 5.1 mEq/L   Chloride 103 96 - 112 mEq/L   CO2 27 19 - 32 mEq/L   Glucose, Bld 84 70 - 99 mg/dL   BUN  16 6 - 23 mg/dL   Creatinine, Ser 4.78 0.40 - 1.50 mg/dL   Total Bilirubin 0.3 0.2 - 1.2 mg/dL   Alkaline Phosphatase 60 39 - 117 U/L   AST 17 0 - 37 U/L   ALT 9 0 - 53 U/L   Total Protein 7.4 6.0 - 8.3 g/dL   Albumin 3.5 3.5 - 5.2 g/dL   GFR 29.56 >21.30 mL/min   Calcium 8.7 8.4 - 10.5 mg/dL  Hemoglobin Q6V  Result Value Ref Range   Hgb A1c MFr Bld 4.5 (L) 4.6 - 6.5 %    This visit occurred during the SARS-CoV-2 public health emergency.  Safety protocols were in place, including screening questions prior to the visit, additional usage of staff PPE, and extensive cleaning of exam room while observing appropriate contact time as indicated for disinfecting solutions.   COVID 19 screen:  No recent travel or known exposure to COVID19 The patient denies respiratory symptoms of COVID 19 at this time. The importance of social distancing was discussed today.   Assessment and Plan   Due for Tdap.. refused. Uptodate with PNA and  COVID vaccine x 3. Shingles:not interested    PSA: Hx of BPH on tamsulosin.  PSA not indicated  Colon: 09/2018 polyps  Dr. Rhea Belton, no further indicated   Smoker: spirometry performed 2017 nml. Pulmonary nodules:  lung cancer screening program:  06/16/2021 IMPRESSION: 1. The 200 cubic mm right upper lobe nodule is unchanged from 11/08/2070 but increased in size from 04/22/2014. That noted, this is in fairly close proximity to right apical pleuroparenchymal scarring which is also substantially increased from 2016, and possibilities for this nodule included extension of this biapical pleuroparenchymal scarring versus a small low-grade adenocarcinoma. Accordingly, surveillance would likely be prudent. Given the lack of change over the last 8 months, follow up noncontrast chest CT would be recommended in 12 months time.  hep C testing: done  Smoker: pre-contemplative  Problem List Items Addressed This Visit     Abdominal aortic aneurysm (AAA) (HCC)    Vascular  following AAA, reviewed last OV note from 06/24/2022 Recent CT scan demonstrated intact repair, unchanged from prior.  No recommendations for further intervention. Recommended follow up with Mr. Nidiffer in 2 years with repeat CT Endo CAP w/ contrast, carotid duplex studies, and BLE duplex studies       Abnormal CT scan    Incidental noting of: Bilateral scattered centrilobular nodules in the dependent segments of the right middle, left upper, and bilateral lower lobes, suggestive of chronic aspiration seen on CT.  Of note this was not mentioned on  lung cancer screening CT from 06/2022   Of note I did discuss with patient in detail possible evaluation for chronic aspiration given CT scan findings per one of the CT scans, he states he to has no swallowing difficulty or coughing during eating.  He is not interested in workup for aspiration.  Bilateral carotid artery stenosis (Chronic)    Status post CEA.  Followed by vascular.  LDL goal less than 70.      BPH with obstruction/lower urinary tract symptoms   Bronchiectasis (HCC)    Followed in the past by pulmonary, no recent symptoms.      Chronic systolic heart failure (HCC)    Chronic, euvolemic in office today Followed by cardiology.  Using Lasix 20 mg as needed      COPD, mild (HCC) (Chronic)    Chronic stable- He states in the past he has tried different inhaler medications and they have not helped.  He states that shortness of breath does not interfere with his activity and he is not interested in returning to see pulmonary or trial of a different inhaler.      HTN (hypertension)    Chronic, blood pressure above goal in office today  Entresto 24/26 twice daily, Toprol XL 25 mg daily, spironolactone 12.5 mg daily.       Hyperlipidemia (Chronic)    Chronic, well controlled on Vytorin.  LDL at goal less than 70      Iron deficiency anemia secondary to inadequate dietary iron intake    Chronic,  followed by Dr.  Shirline Frees.  Reviewed June 23, 2022 office visit Recommended iron infusion with Venofer 300 Mg IV weekly  Workup started and recommended follow-up in 3 months.      Prediabetes    Improved. Lab Results  Component Value Date   HGBA1C 4.5 (L) 08/19/2022        Restless leg syndrome    Chronic, inadequate control despite max dose of ropinirole.  He has not noted clear benefit from low-dose gabapentin.  We will try titrating this up over time.      Relevant Medications   gabapentin (NEURONTIN) 100 MG capsule   Solitary pulmonary nodule    It appears CT scan from 20 22-20 23 did show increase in size of pulmonary nodule, but this nodule has now been stable between 2023 and 2024.  Agree with plan to recheck in 1 year.  Of note I did discuss with patient in detail possible evaluation for chronic aspiration given CT scan findings per one of the CT scans, he states he to has no swallowing difficulty or      Other Visit Diagnoses     Medicare annual wellness visit, subsequent    -  Primary       Meds ordered this encounter  Medications   gabapentin (NEURONTIN) 100 MG capsule    Sig: Take 1 capsule (100 mg total) by mouth at bedtime.    Dispense:  90 capsule    Refill:  3     [name], MD

## 2022-09-08 ENCOUNTER — Other Ambulatory Visit: Payer: Self-pay | Admitting: Family Medicine

## 2022-09-08 ENCOUNTER — Other Ambulatory Visit (HOSPITAL_COMMUNITY): Payer: Self-pay | Admitting: Cardiology

## 2022-09-08 NOTE — Telephone Encounter (Signed)
Last office visit 08/26/2022 for MWV.  Last refilled 07/22/2022 for #30 with no refills.  Next Appt:  No future appointments with PCP.

## 2022-09-20 ENCOUNTER — Other Ambulatory Visit: Payer: Self-pay | Admitting: Family Medicine

## 2022-09-22 ENCOUNTER — Ambulatory Visit: Payer: Medicare Other | Admitting: Internal Medicine

## 2022-09-22 ENCOUNTER — Inpatient Hospital Stay: Payer: Medicare Other | Attending: Internal Medicine

## 2022-09-22 ENCOUNTER — Other Ambulatory Visit: Payer: Self-pay | Admitting: Internal Medicine

## 2022-09-22 ENCOUNTER — Inpatient Hospital Stay (HOSPITAL_BASED_OUTPATIENT_CLINIC_OR_DEPARTMENT_OTHER): Payer: Medicare Other | Admitting: Internal Medicine

## 2022-09-22 ENCOUNTER — Other Ambulatory Visit: Payer: Medicare Other

## 2022-09-22 VITALS — BP 135/49 | HR 70 | Temp 97.6°F | Resp 17 | Ht 63.25 in | Wt 114.2 lb

## 2022-09-22 DIAGNOSIS — D500 Iron deficiency anemia secondary to blood loss (chronic): Secondary | ICD-10-CM | POA: Diagnosis not present

## 2022-09-22 DIAGNOSIS — D509 Iron deficiency anemia, unspecified: Secondary | ICD-10-CM | POA: Diagnosis not present

## 2022-09-22 LAB — CBC WITH DIFFERENTIAL (CANCER CENTER ONLY)
Abs Immature Granulocytes: 0.03 10*3/uL (ref 0.00–0.07)
Basophils Absolute: 0.1 10*3/uL (ref 0.0–0.1)
Basophils Relative: 1 %
Eosinophils Absolute: 0.1 10*3/uL (ref 0.0–0.5)
Eosinophils Relative: 1 %
HCT: 26.8 % — ABNORMAL LOW (ref 39.0–52.0)
Hemoglobin: 8.5 g/dL — ABNORMAL LOW (ref 13.0–17.0)
Immature Granulocytes: 0 %
Lymphocytes Relative: 8 %
Lymphs Abs: 0.8 10*3/uL (ref 0.7–4.0)
MCH: 28.1 pg (ref 26.0–34.0)
MCHC: 31.7 g/dL (ref 30.0–36.0)
MCV: 88.7 fL (ref 80.0–100.0)
Monocytes Absolute: 1.2 10*3/uL — ABNORMAL HIGH (ref 0.1–1.0)
Monocytes Relative: 12 %
Neutro Abs: 8.1 10*3/uL — ABNORMAL HIGH (ref 1.7–7.7)
Neutrophils Relative %: 78 %
Platelet Count: 323 10*3/uL (ref 150–400)
RBC: 3.02 MIL/uL — ABNORMAL LOW (ref 4.22–5.81)
RDW: 15.5 % (ref 11.5–15.5)
WBC Count: 10.3 10*3/uL (ref 4.0–10.5)
nRBC: 0 % (ref 0.0–0.2)

## 2022-09-22 LAB — IRON AND IRON BINDING CAPACITY (CC-WL,HP ONLY)
Iron: 16 ug/dL — ABNORMAL LOW (ref 45–182)
Saturation Ratios: 7 % — ABNORMAL LOW (ref 17.9–39.5)
TIBC: 223 ug/dL — ABNORMAL LOW (ref 250–450)
UIBC: 207 ug/dL (ref 117–376)

## 2022-09-22 LAB — FERRITIN: Ferritin: 101 ng/mL (ref 24–336)

## 2022-09-22 NOTE — Progress Notes (Signed)
[facility] [phone]   [phone]  OFFICE PROGRESS NOTE  Excell Seltzer, MD [address] [place] [address]  DIAGNOSIS: Anemia of chronic disease +/- iron deficiency secondary to gastrointestinal blood loss.  He has no improvement on the oral iron supplement.  PRIOR THERAPY: Iron infusion with Venofer 300 Mg IV weekly for 3 weeks.  Last dose was giving July 02, 2022  CURRENT THERAPY: Observation.  INTERVAL HISTORY: Erik [name]. 82 y.o. male returns to the clinic today for follow-up visit accompanied by his wife Rayfield Citizen.  The patient is feeling fine today with no concerning complaints except for the persistent fatigue.  He received only 1 dose of the iron infusion with Venofer this was discontinued because of insurance coverage issues.  He continues on the oral iron tablet with ferrous sulfate with no improvement in his condition.  He denied having any current chest pain, shortness of breath except with exertion with no cough or hemoptysis.  He has no nausea, vomiting, diarrhea or constipation.  He has no headache or visual changes.  He is here today for evaluation and repeat blood work.  MEDICAL HISTORY: Past Medical History:  Diagnosis Date   Adenomatous colon polyp    Aortic aneurysm (HCC)    BPH (benign prostatic hypertrophy)    CAD (coronary artery disease)    Carotid artery stenosis    a. Bilateral CEA   Cataract    CHF (congestive heart failure) (HCC)    Chronic systolic heart failure (HCC)    a. EF 20-25%, mild LVH, mod HK, mid apicalanteroseptal myocardium, mild MR, LA mod dilated   Collagen vascular disease (HCC)    COPD (chronic obstructive pulmonary disease) (HCC)    Coronary artery disease    a. LHC (08/2013): Lmain: short 30% distal, LAD: sml D1 & D2, 70% ostial D1, 95-99% LAD stenosis prox D2 LCx: sml/mod ramus subtot. occluded, 40% ostial set off lg OM1, 40% AV LCx after OM1, RCA: 90% prox (DES to RCA  and prox LAD)   Diverticulosis    GERD (gastroesophageal reflux disease)    Heart murmur    History of colonic polyps    Hyperlipidemia    Hyperplastic colon polyp    Hypertension    Ischemic cardiomyopathy    RLS (restless legs syndrome)     ALLERGIES:  is allergic to codeine.  MEDICATIONS:  Current Outpatient Medications  Medication Sig Dispense Refill   acetaminophen (TYLENOL) 500 MG tablet Take 1,000 mg by mouth at bedtime.     aspirin EC 81 MG tablet Take 81 mg by mouth every evening.      clopidogrel (PLAVIX) 75 MG tablet TAKE 1 TABLET BY MOUTH EVERY DAY 90 tablet 3   cyanocobalamin (VITAMIN B12) 1000 MCG tablet Take 1,000 mcg by mouth daily.     dapagliflozin propanediol (FARXIGA) 10 MG TABS tablet Take 1 tablet (10 mg total) by mouth daily before breakfast. 30 tablet 11   ezetimibe-simvastatin (VYTORIN) 10-40 MG tablet TAKE 1 TABLET BY MOUTH EVERYDAY AT BEDTIME 90 tablet 0   famotidine (PEPCID) 40 MG tablet Take 40 mg by mouth 2 (two) times daily.     ferrous sulfate 325 (65 FE) MG tablet Take 325 mg by mouth daily with breakfast.     gabapentin (NEURONTIN) 100 MG capsule Take 1 capsule (100 mg total) by mouth at bedtime. 90 capsule 3   methocarbamol (ROBAXIN) 500 MG tablet TAKE 1 TABLET (  500 MG TOTAL) BY MOUTH EVERY DAY AT BEDTIME AS NEEDED FOR MUSCLE SPASM 30 tablet 0   metoprolol succinate (TOPROL-XL) 50 MG 24 hr tablet TAKE 1 TABLET BY MOUTH EVERY DAY WITH OR IMMEDIATELY FOLLOWING MEAL (NEEDS OFFICE VISIT) 60 tablet 0   mirtazapine (REMERON) 15 MG tablet Take 15 mg by mouth at bedtime.     Omega-3 Fatty Acids (FISH OIL) 1000 MG CAPS Take 1 capsule by mouth daily.     spironolactone (ALDACTONE) 25 MG tablet TAKE 1/2 TABLET BY MOUTH EVERY DAY 45 tablet 3   tamsulosin (FLOMAX) 0.4 MG CAPS capsule TAKE 1 CAPSULE BY MOUTH EVERY DAY 90 capsule 3   No current facility-administered medications for this visit.    SURGICAL HISTORY:  Past Surgical History:  Procedure Laterality  Date   BUBBLE STUDY  02/03/2022   Procedure: BUBBLE STUDY;  Surgeon: Meriam Sprague, MD;  Location: [facility] ENDOSCOPY;  Service: Cardiovascular;;   cardiac stents  09-2013   CAROTID ENDARTERECTOMY  04/17/2008   right   CAROTID ENDARTERECTOMY  05/30/08   Left   CATARACT EXTRACTION W/PHACO Right 10/05/2018   Procedure: CATARACT EXTRACTION PHACO AND INTRAOCULAR LENS PLACEMENT (IOC), RIGHT;  Surgeon: Galen Manila, MD;  Location: [facility] ORS;  Service: Ophthalmology;  Laterality: Right;  [place] 01:06.4 cde 12.79 Fluid Pack Lot # [id number]   CATARACT EXTRACTION W/PHACO Left 11/02/2018   Procedure: CATARACT EXTRACTION PHACO AND INTRAOCULAR LENS PLACEMENT (IOC), LEFT;  Surgeon: Galen Manila, MD;  Location: [facility] ORS;  Service: Ophthalmology;  Laterality: Left;  [place]  01:10 CDE 11.52 Fluid pack lot # [id number]   CHOLECYSTECTOMY     Gall Bladder   CORONARY ANGIOGRAM  09/11/2013   Procedure: CORONARY ANGIOGRAM;  Surgeon: Lennette Bihari, MD;  Location: [facility] CATH LAB;  Service: Cardiovascular;;   CORONARY ANGIOPLASTY     STENTS   HERNIA REPAIR     lower aorta aneurysm  05/26/2015   [facility]   PERCUTANEOUS STENT INTERVENTION  09/11/2013   Procedure: PERCUTANEOUS STENT INTERVENTION;  Surgeon: Lennette Bihari, MD;  Location: [facility] CATH LAB;  Service: Cardiovascular;;  DES Prox RCA 3.5x15 xience    TEE WITHOUT CARDIOVERSION N/A 02/03/2022   Procedure: TRANSESOPHAGEAL ECHOCARDIOGRAM (TEE);  Surgeon: Meriam Sprague, MD;  Location: [facility] ENDOSCOPY;  Service: Cardiovascular;  Laterality: N/A;    REVIEW OF SYSTEMS:  A comprehensive review of systems was negative except for: Constitutional: positive for fatigue Respiratory: positive for dyspnea on exertion   PHYSICAL EXAMINATION: General appearance: alert, cooperative, fatigued, and no distress Head: Normocephalic, without obvious abnormality, atraumatic Neck: no adenopathy, no JVD, supple, symmetrical, trachea midline, and thyroid not enlarged, symmetric, no  tenderness/mass/nodules Lymph nodes: Cervical, supraclavicular, and axillary nodes normal. Resp: clear to auscultation bilaterally Back: symmetric, no curvature. ROM normal. No CVA tenderness. Cardio: regular rate and rhythm, S1, S2 normal, no murmur, click, rub or gallop GI: soft, non-tender; bowel sounds normal; no masses,  no organomegaly Extremities: extremities normal, atraumatic, no cyanosis or edema  ECOG PERFORMANCE STATUS: 1 - Symptomatic but completely ambulatory  Blood pressure (!) 135/49, pulse 70, temperature 97.6 F (36.4 C), temperature source Oral, resp. rate 17, height 5' 3.25" (1.607 m), weight 114 lb 3.2 oz (51.8 kg), SpO2 97 %.  LABORATORY DATA: Lab Results  Component Value Date   WBC 10.3 09/22/2022   HGB 8.5 (L) 09/22/2022   HCT 26.8 (L) 09/22/2022   MCV 88.7 09/22/2022   PLT 323 09/22/2022      Chemistry  Component Value Date/Time   NA 136 08/19/2022 0819   NA 138 08/18/2018 1135   K 4.8 08/19/2022 0819   CL 103 08/19/2022 0819   CO2 27 08/19/2022 0819   BUN 16 08/19/2022 0819   BUN 19 08/18/2018 1135   CREATININE 1.00 08/19/2022 0819   CREATININE 1.11 10/09/2021 1646      Component Value Date/Time   CALCIUM 8.7 08/19/2022 0819   ALKPHOS 60 08/19/2022 0819   AST 17 08/19/2022 0819   ALT 9 08/19/2022 0819   BILITOT 0.3 08/19/2022 0819       RADIOGRAPHIC STUDIES: No results found.  ASSESSMENT AND PLAN: This is a very pleasant 82 years old white male with persistent anemia likely anemia of chronic disease plus iron deficiency.  He is status post treatment with 1 iron infusion with Venofer discontinued secondary to lack of insurance coverage.  The patient is also on oral iron tablet with ferrous sulfate once daily with no improvement in his anemia. He had repeat CBC today that showed persistent anemia with hemoglobin of 8.5 and hematocrit 26.8%.  Iron study and ferritin are still pending. I recommended for the patient to continue on the oral  iron supplement with ferrous sulfate and orange juice or vitamin C. I will also arrange for the patient a bone marrow biopsy and aspirate to rule out any underlying myelodysplastic syndrome contributing to his anemia. I will see him back for follow-up visit in around 4 weeks for evaluation and discussion of the biopsy results. He was advised to call immediately if he has any other concerning symptoms in the interval. The patient voices understanding of current disease status and treatment options and is in agreement with the current care plan.  All questions were answered. The patient knows to call the clinic with any problems, questions or concerns. We can certainly see the patient much sooner if necessary.  The total time spent in the appointment was 20 minutes.  Disclaimer: This note was dictated with voice recognition software. Similar sounding words can inadvertently be transcribed and may not be corrected upon review.

## 2022-09-23 ENCOUNTER — Telehealth: Payer: Self-pay

## 2022-09-23 ENCOUNTER — Other Ambulatory Visit: Payer: Self-pay | Admitting: Internal Medicine

## 2022-09-23 NOTE — Telephone Encounter (Signed)
This nurse spoke with patients wife and made her aware that patients iron is low and the provider would like to send him to [facility] infusion center for iron infusion. Advised that the scheduling team will reach out to them to set the appointments.    She acknowledged understanding and has no further questions at this time.

## 2022-09-24 ENCOUNTER — Telehealth: Payer: Self-pay

## 2022-09-24 NOTE — Telephone Encounter (Signed)
Auth Submission: NO AUTH NEEDED Site of care: Site of care: [facility] Payer: Medicare A/B & [employer]  Medication & CPT/J Code(s) submitted: Feraheme (ferumoxytol) [id number] Route of submission (phone, fax, portal):  Phone # Fax # Auth type: Buy/Bill Units/visits requested: 510mg  x 2 doses, at least 7 days apart  Approval from: 09/24/2022 to 01/25/2023

## 2022-09-27 ENCOUNTER — Telehealth: Payer: Self-pay | Admitting: Internal Medicine

## 2022-09-27 ENCOUNTER — Encounter: Payer: Self-pay | Admitting: Primary Care

## 2022-09-27 ENCOUNTER — Ambulatory Visit: Payer: Medicare Other | Admitting: Primary Care

## 2022-09-27 VITALS — BP 130/62 | HR 75 | Temp 98.6°F | Ht 63.25 in | Wt 118.4 lb

## 2022-09-27 DIAGNOSIS — R051 Acute cough: Secondary | ICD-10-CM | POA: Diagnosis not present

## 2022-09-27 LAB — POC COVID19 BINAXNOW: SARS Coronavirus 2 Ag: NEGATIVE

## 2022-09-27 MED ORDER — PREDNISONE 20 MG PO TABS: ORAL_TABLET | ORAL | 0 refills | Status: DC

## 2022-09-27 MED ORDER — BENZONATATE 200 MG PO CAPS
200.0000 mg | ORAL_CAPSULE | Freq: Three times a day (TID) | ORAL | 0 refills | Status: DC | PRN
Start: 2022-09-27 — End: 2022-10-20

## 2022-09-27 MED ORDER — AZITHROMYCIN 250 MG PO TABS: ORAL_TABLET | ORAL | 0 refills | Status: DC

## 2022-09-27 NOTE — Progress Notes (Signed)
Subjective:    Patient ID: Erik Lope., male    DOB: [birth year], 82 y.o.   MRN: [id number]  HPI  Erik [name]. is a very pleasant 82 y.o. male patient of Dr. Ermalene Searing with a history of CAD with NSTEMI, CHF, hypertension, cardiomyopathy, COPD, bronchiectasis, pulmonary nodule, iron deficiency anemia who presents today to discuss cough.  Symptom onset two weeks ago with deep, worsening than usual. He cough is mildly productive. He's also noticed increased shortness of breath.   He denies fevers, chills, body aches.   He's been taking Mucinex with temporary improvement. Overall his cough has not improved and is worse at night.   He underwent CT chest in April 2024 which revealed a stable 8 mm right upper lobe nodule, enlarged from 2022.  Follow-up CT is recommended for April 2025 to evaluate for adenocarcinoma.  He has not tested for COVID-19.   Review of Systems  Constitutional:  Positive for fatigue. Negative for fever.  HENT:  Positive for congestion. Negative for ear pain, sinus pressure, sinus pain and sore throat.   Musculoskeletal:  Negative for myalgias.         Past Medical History:  Diagnosis Date   Adenomatous colon polyp    Aortic aneurysm (HCC)    BPH (benign prostatic hypertrophy)    CAD (coronary artery disease)    Carotid artery stenosis    a. Bilateral CEA   Cataract    CHF (congestive heart failure) (HCC)    Chronic systolic heart failure (HCC)    a. EF 20-25%, mild LVH, mod HK, mid apicalanteroseptal myocardium, mild MR, LA mod dilated   Collagen vascular disease (HCC)    COPD (chronic obstructive pulmonary disease) (HCC)    Coronary artery disease    a. LHC (08/2013): Lmain: short 30% distal, LAD: sml D1 & D2, 70% ostial D1, 95-99% LAD stenosis prox D2 LCx: sml/mod ramus subtot. occluded, 40% ostial set off lg OM1, 40% AV LCx after OM1, RCA: 90% prox (DES to RCA and prox LAD)   Diverticulosis    GERD (gastroesophageal reflux disease)     Heart murmur    History of colonic polyps    Hyperlipidemia    Hyperplastic colon polyp    Hypertension    Ischemic cardiomyopathy    RLS (restless legs syndrome)     Social History   Socioeconomic History   Marital status: Married    Spouse name: Not on file   Number of children: Not on file   Years of education: Not on file   Highest education level: Not on file  Occupational History   Occupation: Retired Radiographer, therapeutic  Tobacco Use   Smoking status: Every Day    Current packs/day: 1.00    Average packs/day: 1 pack/day for 50.0 years (50.0 ttl pk-yrs)    Types: Cigarettes    Passive exposure: Never   Smokeless tobacco: Current   Tobacco comments:    off and on since age 10, currently smokes a pack a day [facility] 12/23/20  Vaping Use   Vaping status: Never Used  Substance and Sexual Activity   Alcohol use: No   Drug use: No   Sexual activity: Yes  Other Topics Concern   Not on file  Social History Narrative   Lives with wife in [place]. Retired from the post [employer]   Social Determinants of Health   Financial Resource Strain: Low Risk  (04/29/2021)   Overall Financial Resource Strain (CARDIA)    Difficulty  of Paying Living Expenses: Not very hard  Food Insecurity: No Food Insecurity (02/22/2022)   Hunger Vital Sign    Worried About Running Out of Food in the Last Year: Never true    Ran Out of Food in the Last Year: Never true  Transportation Needs: No Transportation Needs (02/22/2022)   PRAPARE - Administrator, Civil Service (Medical): No    Lack of Transportation (Non-Medical): No  Physical Activity: Inactive (05/31/2019)   Exercise Vital Sign    Days of Exercise per Week: 0 days    Minutes of Exercise per Session: 0 min  Stress: No Stress Concern Present (05/31/2019)   [employer] of Occupational Health - Occupational Stress Questionnaire    Feeling of Stress : Not at all  Social Connections: Not on file  Intimate Partner Violence: Not At Risk  (02/17/2022)   Humiliation, Afraid, Rape, and Kick questionnaire    Fear of Current or Ex-Partner: No    Emotionally Abused: No    Physically Abused: No    Sexually Abused: No    Past Surgical History:  Procedure Laterality Date   BUBBLE STUDY  02/03/2022   Procedure: BUBBLE STUDY;  Surgeon: Meriam Sprague, MD;  Location: [facility] ENDOSCOPY;  Service: Cardiovascular;;   cardiac stents  09-2013   CAROTID ENDARTERECTOMY  04/17/2008   right   CAROTID ENDARTERECTOMY  05/30/08   Left   CATARACT EXTRACTION W/PHACO Right 10/05/2018   Procedure: CATARACT EXTRACTION PHACO AND INTRAOCULAR LENS PLACEMENT (IOC), RIGHT;  Surgeon: Galen Manila, MD;  Location: [facility] ORS;  Service: Ophthalmology;  Laterality: Right;  [place] 01:06.4 cde 12.79 Fluid Pack Lot # [id number]   CATARACT EXTRACTION W/PHACO Left 11/02/2018   Procedure: CATARACT EXTRACTION PHACO AND INTRAOCULAR LENS PLACEMENT (IOC), LEFT;  Surgeon: Galen Manila, MD;  Location: [facility] ORS;  Service: Ophthalmology;  Laterality: Left;  [place]  01:10 CDE 11.52 Fluid pack lot # [id number]   CHOLECYSTECTOMY     Gall Bladder   CORONARY ANGIOGRAM  09/11/2013   Procedure: CORONARY ANGIOGRAM;  Surgeon: Lennette Bihari, MD;  Location: [facility] CATH LAB;  Service: Cardiovascular;;   CORONARY ANGIOPLASTY     STENTS   HERNIA REPAIR     lower aorta aneurysm  05/26/2015   [facility]   PERCUTANEOUS STENT INTERVENTION  09/11/2013   Procedure: PERCUTANEOUS STENT INTERVENTION;  Surgeon: Lennette Bihari, MD;  Location: [facility] CATH LAB;  Service: Cardiovascular;;  DES Prox RCA 3.5x15 xience    TEE WITHOUT CARDIOVERSION N/A 02/03/2022   Procedure: TRANSESOPHAGEAL ECHOCARDIOGRAM (TEE);  Surgeon: Meriam Sprague, MD;  Location: [facility] ENDOSCOPY;  Service: Cardiovascular;  Laterality: N/A;    Family History  Problem Relation Age of Onset   Heart disease Brother    Hyperlipidemia Brother    Pancreatic cancer Brother    Hypertension Mother    Cancer Father        unknown type; sounds  GI   Emphysema Father    Hyperlipidemia Brother    Hyperlipidemia Brother    Heart attack Brother    Hyperlipidemia Brother    Multiple sclerosis Brother    Kidney disease Neg Hx    Prostate cancer Neg Hx    Colon cancer Neg Hx    Esophageal cancer Neg Hx    Rectal cancer Neg Hx    Stomach cancer Neg Hx     Allergies  Allergen Reactions   Codeine Anaphylaxis and Swelling    throat swells    Current Outpatient Medications on  File Prior to Visit  Medication Sig Dispense Refill   acetaminophen (TYLENOL) 500 MG tablet Take 1,000 mg by mouth at bedtime.     aspirin EC 81 MG tablet Take 81 mg by mouth every evening.      clopidogrel (PLAVIX) 75 MG tablet TAKE 1 TABLET BY MOUTH EVERY DAY 90 tablet 3   cyanocobalamin (VITAMIN B12) 1000 MCG tablet Take 1,000 mcg by mouth daily.     dapagliflozin propanediol (FARXIGA) 10 MG TABS tablet Take 1 tablet (10 mg total) by mouth daily before breakfast. 30 tablet 11   ezetimibe-simvastatin (VYTORIN) 10-40 MG tablet TAKE 1 TABLET BY MOUTH EVERYDAY AT BEDTIME 90 tablet 0   famotidine (PEPCID) 40 MG tablet Take 40 mg by mouth 2 (two) times daily.     ferrous sulfate 325 (65 FE) MG tablet Take 325 mg by mouth daily with breakfast.     gabapentin (NEURONTIN) 100 MG capsule Take 1 capsule (100 mg total) by mouth at bedtime. 90 capsule 3   methocarbamol (ROBAXIN) 500 MG tablet TAKE 1 TABLET (500 MG TOTAL) BY MOUTH EVERY DAY AT BEDTIME AS NEEDED FOR MUSCLE SPASM 30 tablet 0   metoprolol succinate (TOPROL-XL) 50 MG 24 hr tablet TAKE 1 TABLET BY MOUTH EVERY DAY WITH OR IMMEDIATELY FOLLOWING MEAL (NEEDS OFFICE VISIT) 60 tablet 0   mirtazapine (REMERON) 15 MG tablet Take 15 mg by mouth at bedtime.     Omega-3 Fatty Acids (FISH OIL) 1000 MG CAPS Take 1 capsule by mouth daily.     spironolactone (ALDACTONE) 25 MG tablet TAKE 1/2 TABLET BY MOUTH EVERY DAY 45 tablet 3   tamsulosin (FLOMAX) 0.4 MG CAPS capsule TAKE 1 CAPSULE BY MOUTH EVERY DAY 90 capsule 3   No  current facility-administered medications on file prior to visit.    BP 130/62   Pulse 75   Temp 98.6 F (37 C) (Temporal)   Ht 5' 3.25" (1.607 m)   Wt 118 lb 6.4 oz (53.7 kg)   SpO2 95%   BMI 20.81 kg/m  Objective:   Physical Exam Constitutional:      Appearance: He is ill-appearing.  HENT:     Right Ear: Tympanic membrane and ear canal normal.     Left Ear: Tympanic membrane and ear canal normal.     Nose: No mucosal edema.     Right Sinus: No maxillary sinus tenderness or frontal sinus tenderness.     Left Sinus: No maxillary sinus tenderness or frontal sinus tenderness.     Mouth/Throat:     Mouth: Mucous membranes are moist.  Eyes:     Conjunctiva/sclera: Conjunctivae normal.  Cardiovascular:     Rate and Rhythm: Normal rate and regular rhythm.  Pulmonary:     Effort: Pulmonary effort is normal. No tachypnea.     Breath sounds: Examination of the left-lower field reveals rhonchi. Rhonchi present. No wheezing.  Musculoskeletal:     Cervical back: Neck supple.  Skin:    General: Skin is warm and dry.           Assessment & Plan:  Acute cough Assessment & Plan: Questionable lung sounds to left lower fields, overall patient appears stable in the office today.  Given duration of symptoms, coupled with exam findings and medical history, will treat for presumed bacterial involvement.  Start Azithromycin antibiotics for infection. Take 2 tablets by mouth today, then 1 tablet daily for 4 additional days. Start prednisone 20 mg tablets. Take 2 tablets by mouth once  daily in the morning for 5 days. You may take Benzonatate capsules for cough. Take 1 capsule by mouth three times daily as needed for cough.  Please notify us if no improvement within a few days.  Orders: -     POC COVID-19 BinaxNow -     predniSONE; Take 2 tablets by mouth once daily for 5 days.  Dispense: 10 tablet; Refill: 0 -     Azithromycin; Take 2 tablets by mouth today, then 1 tablet daily for 4  additional days.  Dispense: 6 tablet; Refill: 0 -     Benzonatate; Take 1 capsule (200 mg total) by mouth 3 (three) times daily as needed for cough.  Dispense: 30 capsule; Refill: 0        Doreene Nest, NP

## 2022-09-27 NOTE — Telephone Encounter (Signed)
Scheduled per 07/10 los, patient has been called and notified. 

## 2022-09-27 NOTE — Assessment & Plan Note (Addendum)
Questionable lung sounds to left lower fields, overall patient appears stable in the office today.  Given duration of symptoms, coupled with exam findings and medical history, will treat for presumed bacterial involvement.  Start Azithromycin antibiotics for infection. Take 2 tablets by mouth today, then 1 tablet daily for 4 additional days. Start prednisone 20 mg tablets. Take 2 tablets by mouth once daily in the morning for 5 days. You may take Benzonatate capsules for cough. Take 1 capsule by mouth three times daily as needed for cough.  Please notify us if no improvement within a few days. Negative COVID-19 test.

## 2022-09-27 NOTE — Patient Instructions (Signed)
Start Azithromycin antibiotics for infection. Take 2 tablets by mouth today, then 1 tablet daily for 4 additional days.  Start prednisone 20 mg tablets. Take 2 tablets by mouth once daily in the morning for 5 days.  You may take Benzonatate capsules for cough. Take 1 capsule by mouth three times daily as needed for cough.  It was a pleasure meeting you!

## 2022-10-04 ENCOUNTER — Other Ambulatory Visit: Payer: Self-pay | Admitting: Family Medicine

## 2022-10-04 ENCOUNTER — Other Ambulatory Visit (HOSPITAL_COMMUNITY): Payer: Self-pay | Admitting: Cardiology

## 2022-10-04 NOTE — Telephone Encounter (Signed)
Last office visit 09/27/22 with Allayne Gitelman for acute cough.  Last refilled 09/08/2022 for #30 with no refills.  Next appt: No future appointments with PCP.

## 2022-10-05 ENCOUNTER — Ambulatory Visit (INDEPENDENT_AMBULATORY_CARE_PROVIDER_SITE_OTHER): Payer: Medicare Other | Admitting: *Deleted

## 2022-10-05 VITALS — BP 138/52 | HR 67 | Temp 98.3°F | Resp 16 | Ht 66.0 in | Wt 118.2 lb

## 2022-10-05 DIAGNOSIS — D500 Iron deficiency anemia secondary to blood loss (chronic): Secondary | ICD-10-CM

## 2022-10-05 DIAGNOSIS — K922 Gastrointestinal hemorrhage, unspecified: Secondary | ICD-10-CM

## 2022-10-05 MED ORDER — DIPHENHYDRAMINE HCL 25 MG PO CAPS
25.0000 mg | ORAL_CAPSULE | Freq: Once | ORAL | Status: AC
  Administered 2022-10-05: 25 mg via ORAL
  Filled 2022-10-05: qty 1

## 2022-10-05 MED ORDER — SODIUM CHLORIDE 0.9 % IV SOLN
510.0000 mg | Freq: Once | INTRAVENOUS | Status: AC
  Administered 2022-10-05: 510 mg via INTRAVENOUS
  Filled 2022-10-05: qty 17

## 2022-10-05 MED ORDER — ACETAMINOPHEN 325 MG PO TABS
650.0000 mg | ORAL_TABLET | Freq: Once | ORAL | Status: AC
  Administered 2022-10-05: 650 mg via ORAL
  Filled 2022-10-05: qty 2

## 2022-10-05 NOTE — Progress Notes (Signed)
Diagnosis: Iron Deficiency Anemia  Provider:  Chilton Greathouse MD  Procedure: IV Infusion  IV Type: Peripheral, IV Location: R Antecubital  Feraheme (Ferumoxytol), Dose: 510 mg  Infusion Start Time: 1435  Infusion Stop Time: 1451  Post Infusion IV Care: Observation period completed and Peripheral IV Discontinued  Discharge: Condition: Good, Destination: Home . AVS Provided  Performed by:  Forrest Moron, RN

## 2022-10-06 ENCOUNTER — Other Ambulatory Visit: Payer: Self-pay | Admitting: Family Medicine

## 2022-10-10 ENCOUNTER — Other Ambulatory Visit: Payer: Self-pay | Admitting: Family Medicine

## 2022-10-11 MED ORDER — ROPINIROLE HCL 3 MG PO TABS: ORAL_TABLET | ORAL | 1 refills | Status: DC

## 2022-10-11 NOTE — Telephone Encounter (Signed)
Spoke with Erik [name].  She states Erik [name] is still taking the Ropinirole 3 mg and doesn't understand why is was taken off of his medication list. I am thinking Remeron should have been removed and not the ropinirole.  Office note on 05/25/22 states:   Improving with increase appetite.   On Remeron 15 mg daily.... Will stop this medication as we are starting trial of gabapentin.  He will keep a close eye on his appetite and if it decreases we will consider restarting.   She states she didn't ask anything about a cream.  I advised I would send the refill to their pharmacy now.

## 2022-10-11 NOTE — Telephone Encounter (Signed)
Not on current medication list.  States it was discontinued by Dr. Ermalene Searing on 05/25/2022.  Please advise.

## 2022-10-11 NOTE — Telephone Encounter (Signed)
If he is still taking the ropinirole it is okay to refill the medication. I think it was discontinued as I thought he said it was not helping.  I am not sure what cream she is talking about, please verify.

## 2022-10-11 NOTE — Telephone Encounter (Signed)
Erik [name] call back asking if we could increase the Requip to taking it 1/2 tab am,  1/2 tab at lunch and 1 tab at bedtime.  Please advise.  If okay,  please sign Rx below.

## 2022-10-11 NOTE — Addendum Note (Signed)
Addended by: Damita Lack on: 10/11/2022 04:47 PM   Modules accepted: Orders

## 2022-10-11 NOTE — Telephone Encounter (Signed)
Prescription Request  10/11/2022  LOV: 08/26/2022  What is the name of the medication or equipment?  rOPINIRole HCl 3 MG  Have you contacted your pharmacy to request a refill? No   Which pharmacy would you like this sent to?  [id number], [place] - [address] [place] [address] Phone: [phone] Fax: [phone]    Patient notified that their request is being sent to the clinical staff for review and that they should receive a response within 2 business days.   Please advise at Mobile [phone] (mobile)   Couldn't find meds on pt's current med list.

## 2022-10-11 NOTE — Telephone Encounter (Signed)
Pt's wife, [name], called to get update on med refill? [name] states the pt's pharmacy gave the pt 2 extra tablets due to the pt being out of meds. [name] asked why was the cream discontinued without the pt knowing? Please advise. Call back # [phone]

## 2022-10-12 ENCOUNTER — Ambulatory Visit (INDEPENDENT_AMBULATORY_CARE_PROVIDER_SITE_OTHER): Payer: Medicare Other

## 2022-10-12 VITALS — BP 119/46 | HR 62 | Temp 97.9°F | Resp 18 | Ht 66.0 in | Wt 110.0 lb

## 2022-10-12 DIAGNOSIS — K922 Gastrointestinal hemorrhage, unspecified: Secondary | ICD-10-CM

## 2022-10-12 DIAGNOSIS — D500 Iron deficiency anemia secondary to blood loss (chronic): Secondary | ICD-10-CM

## 2022-10-12 MED ORDER — DIPHENHYDRAMINE HCL 25 MG PO CAPS
25.0000 mg | ORAL_CAPSULE | Freq: Once | ORAL | Status: AC
  Administered 2022-10-12: 25 mg via ORAL
  Filled 2022-10-12: qty 1

## 2022-10-12 MED ORDER — ROPINIROLE HCL 3 MG PO TABS: ORAL_TABLET | ORAL | 1 refills | Status: DC

## 2022-10-12 MED ORDER — SODIUM CHLORIDE 0.9 % IV SOLN
510.0000 mg | Freq: Once | INTRAVENOUS | Status: AC
  Administered 2022-10-12: 510 mg via INTRAVENOUS
  Filled 2022-10-12: qty 17

## 2022-10-12 MED ORDER — ACETAMINOPHEN 325 MG PO TABS
650.0000 mg | ORAL_TABLET | Freq: Once | ORAL | Status: AC
  Administered 2022-10-12: 650 mg via ORAL
  Filled 2022-10-12: qty 2

## 2022-10-12 NOTE — Telephone Encounter (Signed)
I called and [facility] to make sure the right Rx was getting refilled on the Requip since we sent in dose change.  They will cancel refill sent in yesterday.  Spoke with Erik [name] to let him know that Dr. Ermalene Searing did send in increase dose for him as requested and [facility] is [facility] now.

## 2022-10-12 NOTE — Addendum Note (Signed)
Addended by: Linzie Collin on: 10/12/2022 03:11 PM   Modules accepted: Orders

## 2022-10-12 NOTE — Progress Notes (Signed)
Diagnosis: Iron Deficiency Anemia  Provider:  Chilton Greathouse MD  Procedure: IV Infusion  IV Type: Peripheral, IV Location: L Forearm  Feraheme (Ferumoxytol), Dose: 510 mg  Infusion Start Time: 1437  Infusion Stop Time: 1455  Post Infusion IV Care: Patient declined observation and Peripheral IV Discontinued  Discharge: Condition: Good, Destination: Home . AVS Declined  Performed by:  Marlow Baars [place], RN

## 2022-10-13 ENCOUNTER — Other Ambulatory Visit: Payer: Self-pay | Admitting: Physician Assistant

## 2022-10-13 DIAGNOSIS — Z01818 Encounter for other preprocedural examination: Secondary | ICD-10-CM

## 2022-10-14 ENCOUNTER — Other Ambulatory Visit: Payer: Self-pay

## 2022-10-14 ENCOUNTER — Ambulatory Visit (HOSPITAL_COMMUNITY)
Admission: RE | Admit: 2022-10-14 | Discharge: 2022-10-14 | Disposition: A | Discharge status: Home / Self Care | Payer: Medicare Other | Source: Ambulatory Visit

## 2022-10-14 ENCOUNTER — Encounter (HOSPITAL_COMMUNITY): Payer: Self-pay

## 2022-10-14 ENCOUNTER — Ambulatory Visit (HOSPITAL_COMMUNITY)
Admission: RE | Admit: 2022-10-14 | Discharge: 2022-10-14 | Disposition: A | Discharge status: Home / Self Care | Payer: Medicare Other | Source: Ambulatory Visit | Attending: Internal Medicine | Admitting: Internal Medicine

## 2022-10-14 DIAGNOSIS — D508 Other iron deficiency anemias: Secondary | ICD-10-CM

## 2022-10-14 DIAGNOSIS — D500 Iron deficiency anemia secondary to blood loss (chronic): Secondary | ICD-10-CM

## 2022-10-14 DIAGNOSIS — D649 Anemia, unspecified: Secondary | ICD-10-CM | POA: Diagnosis not present

## 2022-10-14 DIAGNOSIS — Z01812 Encounter for preprocedural laboratory examination: Secondary | ICD-10-CM | POA: Insufficient documentation

## 2022-10-14 DIAGNOSIS — D539 Nutritional anemia, unspecified: Secondary | ICD-10-CM | POA: Diagnosis not present

## 2022-10-14 DIAGNOSIS — Z01818 Encounter for other preprocedural examination: Secondary | ICD-10-CM

## 2022-10-14 LAB — PREPARE RBC (CROSSMATCH)

## 2022-10-14 LAB — CBC WITH DIFFERENTIAL/PLATELET
Abs Immature Granulocytes: 0.03 10*3/uL (ref 0.00–0.07)
Basophils Absolute: 0.1 10*3/uL (ref 0.0–0.1)
Basophils Relative: 1 %
Eosinophils Absolute: 0.4 10*3/uL (ref 0.0–0.5)
Eosinophils Relative: 5 %
HCT: 22.4 % — ABNORMAL LOW (ref 39.0–52.0)
Hemoglobin: 6.5 g/dL — CL (ref 13.0–17.0)
Immature Granulocytes: 0 %
Lymphocytes Relative: 14 %
Lymphs Abs: 0.9 10*3/uL (ref 0.7–4.0)
MCH: 29.5 pg (ref 26.0–34.0)
MCHC: 29 g/dL — ABNORMAL LOW (ref 30.0–36.0)
MCV: 101.8 fL — ABNORMAL HIGH (ref 80.0–100.0)
Monocytes Absolute: 0.8 10*3/uL (ref 0.1–1.0)
Monocytes Relative: 12 %
Neutro Abs: 4.6 10*3/uL (ref 1.7–7.7)
Neutrophils Relative %: 68 %
Platelets: 316 10*3/uL (ref 150–400)
RBC: 2.2 MIL/uL — ABNORMAL LOW (ref 4.22–5.81)
RDW: 24.7 % — ABNORMAL HIGH (ref 11.5–15.5)
WBC: 6.8 10*3/uL (ref 4.0–10.5)
nRBC: 0.3 % — ABNORMAL HIGH (ref 0.0–0.2)

## 2022-10-14 LAB — BPAM RBC
Blood Product Expiration Date: 202408272359
Blood Product Expiration Date: 202408272359
ISSUE DATE / TIME: 202408011130
ISSUE DATE / TIME: 202408011405
Unit Type and Rh: 6200
Unit Type and Rh: 6200

## 2022-10-14 LAB — TYPE AND SCREEN
ABO/RH(D): A POS
Antibody Screen: NEGATIVE
Unit division: 0
Unit division: 0

## 2022-10-14 MED ORDER — MIDAZOLAM HCL 2 MG/2ML IJ SOLN
INTRAMUSCULAR | Status: AC
  Filled 2022-10-14: qty 4

## 2022-10-14 MED ORDER — SODIUM CHLORIDE 0.9 % IV SOLN: INTRAVENOUS | Status: DC

## 2022-10-14 MED ORDER — FENTANYL CITRATE (PF) 100 MCG/2ML IJ SOLN
INTRAMUSCULAR | Status: AC
  Filled 2022-10-14: qty 4

## 2022-10-14 MED ORDER — ACETAMINOPHEN 325 MG PO TABS
650.0000 mg | ORAL_TABLET | Freq: Once | ORAL | Status: AC
  Administered 2022-10-14: 650 mg via ORAL
  Filled 2022-10-14: qty 2

## 2022-10-14 MED ORDER — SODIUM CHLORIDE 0.9% IV SOLUTION: Freq: Once | INTRAVENOUS | Status: DC

## 2022-10-14 MED ORDER — NALOXONE HCL 0.4 MG/ML IJ SOLN
INTRAMUSCULAR | Status: AC
  Filled 2022-10-14: qty 1

## 2022-10-14 MED ORDER — FLUMAZENIL 0.5 MG/5ML IV SOLN
INTRAVENOUS | Status: AC
  Filled 2022-10-14: qty 5

## 2022-10-14 MED ORDER — LIDOCAINE HCL (PF) 1 % IJ SOLN
INTRAMUSCULAR | Status: AC | PRN
  Administered 2022-10-14: 10 mL

## 2022-10-14 MED ORDER — DIPHENHYDRAMINE HCL 25 MG PO CAPS: 25.0000 mg | ORAL_CAPSULE | Freq: Once | ORAL | Status: DC

## 2022-10-14 MED ORDER — MIDAZOLAM HCL 2 MG/2ML IJ SOLN
INTRAMUSCULAR | Status: AC | PRN
  Administered 2022-10-14 (×2): 1 mg via INTRAVENOUS

## 2022-10-14 MED ORDER — FUROSEMIDE 10 MG/ML IJ SOLN
20.0000 mg | Freq: Once | INTRAMUSCULAR | Status: AC
  Administered 2022-10-14: 20 mg via INTRAVENOUS
  Filled 2022-10-14: qty 2

## 2022-10-14 MED ORDER — FENTANYL CITRATE (PF) 100 MCG/2ML IJ SOLN
INTRAMUSCULAR | Status: AC | PRN
Start: 2022-10-14 — End: 2022-10-14
  Administered 2022-10-14 (×2): 50 ug via INTRAVENOUS

## 2022-10-14 NOTE — Discharge Instructions (Addendum)
Moderate Conscious Sedation-Care After  This sheet gives you information about how to care for yourself after your procedure. Your health care provider may also give you more specific instructions. If you have problems or questions, contact your health care provider.  After the procedure, it is common to have: Sleepiness for several hours. Impaired judgment for several hours. Difficulty with balance. Vomiting if you eat too soon.  Follow these instructions at home:  Rest. Do not participate in activities where you could fall or become injured. Do not drive or use machinery. Do not drink alcohol. Do not take sleeping pills or medicines that cause drowsiness. Do not make important decisions or sign legal documents. Do not take care of children on your own.  Eating and drinking Follow the diet recommended by your health care provider. Drink enough fluid to keep your urine pale yellow. If you vomit: Drink water, juice, or soup when you can drink without vomiting. Make sure you have little or no nausea before eating solid foods.  General instructions Take over-the-counter and prescription medicines only as told by your health care provider. Have a responsible adult stay with you for the time you are told. It is important to have someone help care for you until you are awake and alert. Do not smoke. Keep all follow-up visits as told by your health care provider. This is important.  Contact a health care provider if: You are still sleepy or having trouble with balance after 24 hours. You feel light-headed. You keep feeling nauseous or you keep vomiting. You develop a rash. You have a fever. You have redness or swelling around the IV site.  Get help right away if: You have trouble breathing. You have new-onset confusion at home.  This information is not intended to replace advice given to you by your health care provider. Make sure you discuss any questions you have with your  healthcare provider.  Please call Interventional Radiology clinic [phone] with any questions or concerns.  You may remove your dressing and shower tomorrow.  After the procedure, it is common to have: Soreness Bruising Mild pain  Follow these instructions at home:  Medication: Do not use Aspirin or ibuprofen products, such as Advil or Motrin, as it may increase bleeding You may resume your usual medications as ordered by your doctor If your doctor prescribed antibiotics, take them as directed. Do not stop taking them just because you feel better. You need to take the full course of antibiotics  Eating and drinking: Drink plenty of liquids to keep your urine pale yellow You can resume your regular diet as directed by your doctor   Care of the procedure site  Check your biopsy site every day until it heals  Keep the bandage dry. You can take the bandage off and shower tomorrow  If you are bleeding from the biopsy site, apply firm pressure on the area for at least 30 minutes  If directed, apply ice to your biopsy site 2-3 times a day.  o Put ice in a plastic bag  o Place a towel between your skin and the ice bag  o Leave the ice in place for 20 minutes 2-3 times a day  Activity Do not take baths, swim, or use a hot tub until your health care provider approves  Keep all follow-up visits as told by your doctor  Contact your doctor or seek immediate medical care if: You have bright red bleeding from the biopsy site that does not  stop after 30 minutes of holding direct pressure on the site. You have signs of infection, such as: Increased pain, swelling, warmth, or redness at the biopsy site Red streaks leading from the biopsy site Yellow or green drainage from the biopsy site A fever (temperature over 100.55F) chills, or both

## 2022-10-14 NOTE — Procedures (Signed)
Interventional Radiology Procedure Note ° °Indication: Anemia ° °Procedure: CT guided aspirate and core biopsy of right iliac bone ° °Complications: None ° °Bleeding: Minimal ° °Farhaan Mir, MD °[phone] ° ° °

## 2022-10-14 NOTE — Progress Notes (Signed)
Discussed with IR team and Dr. Arbutus Ped patient being sleepy post BM bx. Discussed holding benadryl and per Dr. Arbutus Ped:  "Yes. He can completely avoid the Benadryl if needed."  Benadryl held.

## 2022-10-14 NOTE — Sedation Documentation (Signed)
Sample #3 obtained 

## 2022-10-14 NOTE — Progress Notes (Signed)
CRITICAL RESULT PROVIDER NOTIFICATION  Test performed and critical result:  CBC/Hg of 6.5  Date and time result received:  10/14/22 @ 08:07  Provider name/title: Dr. Bryn Gulling   Date and time provider notified: 10/14/22 @ 08:08  Date and time provider responded: 10/14/22 @ 08:08  Provider response: No new orders at this time.

## 2022-10-14 NOTE — Sedation Documentation (Signed)
Sample #2 obtained 

## 2022-10-14 NOTE — H&P (Addendum)
Chief Complaint: Patient was seen in consultation today for bone marrow biopsy   Referring Physician(s): Mohamed,Mohamed  Supervising Physician: Mir, Mauri Reading  Patient Status: [facility] - Out-pt  History of Present Illness: Erik [name]. is a 82 y.o. male being worked up for anemia. He is referred for bone marrow biopsy.  PMHx, meds, labs, imaging, allergies reviewed. Feels well, no recent fevers, chills, illness. Has been NPO today as directed. Wife at bedside.   Past Medical History:  Diagnosis Date   Adenomatous colon polyp    Aortic aneurysm (HCC)    BPH (benign prostatic hypertrophy)    CAD (coronary artery disease)    Carotid artery stenosis    a. Bilateral CEA   Cataract    CHF (congestive heart failure) (HCC)    Chronic systolic heart failure (HCC)    a. EF 20-25%, mild LVH, mod HK, mid apicalanteroseptal myocardium, mild MR, LA mod dilated   Collagen vascular disease (HCC)    COPD (chronic obstructive pulmonary disease) (HCC)    Coronary artery disease    a. LHC (08/2013): Lmain: short 30% distal, LAD: sml D1 & D2, 70% ostial D1, 95-99% LAD stenosis prox D2 LCx: sml/mod ramus subtot. occluded, 40% ostial set off lg OM1, 40% AV LCx after OM1, RCA: 90% prox (DES to RCA and prox LAD)   Diverticulosis    GERD (gastroesophageal reflux disease)    Heart murmur    History of colonic polyps    Hyperlipidemia    Hyperplastic colon polyp    Hypertension    Ischemic cardiomyopathy    RLS (restless legs syndrome)     Past Surgical History:  Procedure Laterality Date   BUBBLE STUDY  02/03/2022   Procedure: BUBBLE STUDY;  Surgeon: Meriam Sprague, MD;  Location: [facility] ENDOSCOPY;  Service: Cardiovascular;;   cardiac stents  09-2013   CAROTID ENDARTERECTOMY  04/17/2008   right   CAROTID ENDARTERECTOMY  05/30/08   Left   CATARACT EXTRACTION W/PHACO Right 10/05/2018   Procedure: CATARACT EXTRACTION PHACO AND INTRAOCULAR LENS PLACEMENT (IOC), RIGHT;  Surgeon:  Galen Manila, MD;  Location: [facility] ORS;  Service: Ophthalmology;  Laterality: Right;  [place] 01:06.4 cde 12.79 Fluid Pack Lot # [id number]   CATARACT EXTRACTION W/PHACO Left 11/02/2018   Procedure: CATARACT EXTRACTION PHACO AND INTRAOCULAR LENS PLACEMENT (IOC), LEFT;  Surgeon: Galen Manila, MD;  Location: [facility] ORS;  Service: Ophthalmology;  Laterality: Left;  [place]  01:10 CDE 11.52 Fluid pack lot # [id number]   CHOLECYSTECTOMY     Gall Bladder   CORONARY ANGIOGRAM  09/11/2013   Procedure: CORONARY ANGIOGRAM;  Surgeon: Lennette Bihari, MD;  Location: [facility] CATH LAB;  Service: Cardiovascular;;   CORONARY ANGIOPLASTY     STENTS   HERNIA REPAIR     lower aorta aneurysm  05/26/2015   [facility]   PERCUTANEOUS STENT INTERVENTION  09/11/2013   Procedure: PERCUTANEOUS STENT INTERVENTION;  Surgeon: Lennette Bihari, MD;  Location: [facility] CATH LAB;  Service: Cardiovascular;;  DES Prox RCA 3.5x15 xience    TEE WITHOUT CARDIOVERSION N/A 02/03/2022   Procedure: TRANSESOPHAGEAL ECHOCARDIOGRAM (TEE);  Surgeon: Meriam Sprague, MD;  Location: [facility] ENDOSCOPY;  Service: Cardiovascular;  Laterality: N/A;    Allergies: Codeine  Medications: Prior to Admission medications   Medication Sig Start Date End Date Taking? Authorizing Provider  acetaminophen (TYLENOL) 500 MG tablet Take 1,000 mg by mouth at bedtime.   Yes [provider]  aspirin EC 81 MG tablet Take 81 mg by mouth  every evening.    Yes [provider]  clopidogrel (PLAVIX) 75 MG tablet TAKE 1 TABLET BY MOUTH EVERY DAY 11/30/21  Yes Laurey Morale, MD  cyanocobalamin (VITAMIN B12) 1000 MCG tablet Take 1,000 mcg by mouth daily.   Yes [provider]  dapagliflozin propanediol (FARXIGA) 10 MG TABS tablet Take 1 tablet (10 mg total) by mouth daily before breakfast. 11/27/21  Yes Laurey Morale, MD  ezetimibe-simvastatin (VYTORIN) 10-40 MG tablet TAKE 1 TABLET BY MOUTH EVERYDAY AT BEDTIME 10/06/22  Yes Bedsole, Amy E, MD  famotidine  (PEPCID) 40 MG tablet Take 40 mg by mouth 2 (two) times daily.   Yes [provider]  ferrous sulfate 325 (65 FE) MG tablet Take 325 mg by mouth daily with breakfast.   Yes [provider]  gabapentin (NEURONTIN) 100 MG capsule Take 1 capsule (100 mg total) by mouth at bedtime. 08/26/22  Yes Bedsole, Amy E, MD  methocarbamol (ROBAXIN) 500 MG tablet TAKE 1 TABLET BY MOUTH EVERY DAY AT BEDTIME AS NEEDED FOR MUSCLE SPASMS 10/05/22  Yes Bedsole, Amy E, MD  metoprolol succinate (TOPROL-XL) 50 MG 24 hr tablet TAKE 1 TABLET BY MOUTH EVERY DAY WITH OR IMMEDIATELY FOLLOWING MEAL (NEEDS OFFICE VISIT) 10/04/22  Yes Laurey Morale, MD  Omega-3 Fatty Acids (FISH OIL) 1000 MG CAPS Take 1 capsule by mouth daily.   Yes [provider]  rOPINIRole (REQUIP) 3 MG tablet TAKE 1/2 TABLET BY MOUTH IN THE MORNING, 1/2 TABLET MID DAY & TAKE 1 TABLET AT NIGHT 10/12/22  Yes Bedsole, Amy E, MD  spironolactone (ALDACTONE) 25 MG tablet TAKE 1/2 TABLET BY MOUTH EVERY DAY 11/18/21  Yes Laurey Morale, MD  tamsulosin (FLOMAX) 0.4 MG CAPS capsule TAKE 1 CAPSULE BY MOUTH EVERY DAY 09/08/22  Yes Bedsole, Amy E, MD  azithromycin (ZITHROMAX) 250 MG tablet Take 2 tablets by mouth today, then 1 tablet daily for 4 additional days. 09/27/22   Doreene Nest, NP  benzonatate (TESSALON) 200 MG capsule Take 1 capsule (200 mg total) by mouth 3 (three) times daily as needed for cough. 09/27/22   Doreene Nest, NP  predniSONE (DELTASONE) 20 MG tablet Take 2 tablets by mouth once daily for 5 days. 09/27/22   Doreene Nest, NP     Family History  Problem Relation Age of Onset   Heart disease Brother    Hyperlipidemia Brother    Pancreatic cancer Brother    Hypertension Mother    Cancer Father        unknown type; sounds GI   Emphysema Father    Hyperlipidemia Brother    Hyperlipidemia Brother    Heart attack Brother    Hyperlipidemia Brother    Multiple sclerosis Brother    Kidney disease Neg Hx     Prostate cancer Neg Hx    Colon cancer Neg Hx    Esophageal cancer Neg Hx    Rectal cancer Neg Hx    Stomach cancer Neg Hx     Social History   Socioeconomic History   Marital status: Married    Spouse name: Not on file   Number of children: Not on file   Years of education: Not on file   Highest education level: Not on file  Occupational History   Occupation: Retired Radiographer, therapeutic  Tobacco Use   Smoking status: Every Day    Current packs/day: 1.00    Average packs/day: 1 pack/day for 50.0 years (50.0 ttl pk-yrs)  Types: Cigarettes    Passive exposure: Never   Smokeless tobacco: Current   Tobacco comments:    off and on since age 45, currently smokes a pack a day [facility] 12/23/20  Vaping Use   Vaping status: Never Used  Substance and Sexual Activity   Alcohol use: No   Drug use: No   Sexual activity: Yes  Other Topics Concern   Not on file  Social History Narrative   Lives with wife in [place]. Retired from the [employer]   Social Determinants of Corporate investment banker Strain: Low Risk  (04/29/2021)   Overall Financial Resource Strain (CARDIA)    Difficulty of Paying Living Expenses: Not very hard  Food Insecurity: No Food Insecurity (02/22/2022)   Hunger Vital Sign    Worried About Running Out of Food in the Last Year: Never true    Ran Out of Food in the Last Year: Never true  Transportation Needs: No Transportation Needs (02/22/2022)   PRAPARE - Administrator, Civil Service (Medical): No    Lack of Transportation (Non-Medical): No  Physical Activity: Inactive (05/31/2019)   Exercise Vital Sign    Days of Exercise per Week: 0 days    Minutes of Exercise per Session: 0 min  Stress: No Stress Concern Present (05/31/2019)   [employer] of Occupational Health - Occupational Stress Questionnaire    Feeling of Stress : Not at all  Social Connections: Not on file    Review of Systems: A 12 point ROS discussed and pertinent positives are indicated  in the HPI above.  All other systems are negative.  Review of Systems  Vital Signs: BP (!) 131/40   Pulse (!) 56   Temp 97.6 F (36.4 C) (Oral)   Resp 18   SpO2 96%   Physical Exam Constitutional:      Appearance: He is not ill-appearing.  HENT:     Mouth/Throat:     Mouth: Mucous membranes are moist.     Pharynx: Oropharynx is clear.  Cardiovascular:     Rate and Rhythm: Normal rate and regular rhythm.     Heart sounds: Normal heart sounds.  Pulmonary:     Effort: Pulmonary effort is normal. No respiratory distress.     Breath sounds: Normal breath sounds.  Skin:    General: Skin is warm and dry.  Neurological:     General: No focal deficit present.     Mental Status: He is alert and oriented to person, place, and time.  Psychiatric:        Mood and Affect: Mood normal.        Thought Content: Thought content normal.     Imaging: No results found.  Labs:  CBC: Recent Labs    05/25/22 1053 06/23/22 1104 09/22/22 0957 10/14/22 0711  WBC 9.3 6.9 10.3 6.8  HGB 8.8 Repeated and verified X2.* 8.5* 8.5* 6.5*  HCT 27.4* 28.3* 26.8* 22.4*  PLT 475.0* 264 323 316    COAGS: Recent Labs    01/29/22 2011 01/31/22 1102 02/02/22 0616 02/03/22 0536 02/15/22 2340  INR 1.4* 2.2* 1.3* 1.3* 1.3*  APTT 34 48*  --   --  33    BMP: Recent Labs    02/17/22 0235 02/18/22 0440 02/23/22 1621 03/09/22 0812 06/23/22 1104 08/19/22 0819  NA 131* 132* 135 133* 137 136  K 3.7 3.9 4.5 4.4 3.8 4.8  CL 100 100 97 102 104 103  CO2 23 26 33*  25 29 27   GLUCOSE 90 109* 92 106* 134* 84  BUN 20 17 17 18 15 16   CALCIUM 7.7* 7.6* 8.2* 8.6* 8.8* 8.7  CREATININE 1.08 1.00 1.14 1.15 0.99 1.00  GFRNONAA >60 >60  --  >60 >60  --     LIVER FUNCTION TESTS: Recent Labs    02/23/22 1621 03/09/22 0812 06/23/22 1104 08/19/22 0819  BILITOT 0.2 0.2* 0.3 0.3  AST 16 19 13* 17  ALT 1 9 9 9   ALKPHOS 67 55 62 60  PROT 7.1 7.9 7.5 7.4  ALBUMIN 3.0* 2.8* 3.4* 3.5      Assessment and Plan: Anemia Hgb decreased to 6.5 from last 8.3 Have reached out to Dr. Shirline Frees For image guided bone marrow biopsy. Risks and benefits of bone marrow bx was discussed with the patient and/or patient's family including, but not limited to bleeding, infection, damage to adjacent structures or low yield requiring additional tests.  All of the questions were answered and there is agreement to proceed.  Consent signed and in chart.    Electronically Signed: Brayton El, PA-C 10/14/2022, 8:26 AM   I spent a total of 20 minutes in face to face in clinical consultation, greater than 50% of which was counseling/coordinating care for bone marrow bx

## 2022-10-14 NOTE — Sedation Documentation (Signed)
Sample #1 obtained 

## 2022-10-20 ENCOUNTER — Other Ambulatory Visit: Payer: Self-pay

## 2022-10-20 ENCOUNTER — Inpatient Hospital Stay: Payer: Medicare Other | Attending: Internal Medicine | Admitting: Internal Medicine

## 2022-10-20 ENCOUNTER — Inpatient Hospital Stay: Payer: Medicare Other

## 2022-10-20 ENCOUNTER — Ambulatory Visit (HOSPITAL_COMMUNITY)
Admission: RE | Admit: 2022-10-20 | Discharge: 2022-10-20 | Disposition: A | Discharge status: Home / Self Care | Payer: Medicare Other | Source: Ambulatory Visit | Attending: Internal Medicine | Admitting: Internal Medicine

## 2022-10-20 VITALS — BP 126/45 | HR 60 | Temp 97.8°F | Resp 16 | Wt 109.3 lb

## 2022-10-20 DIAGNOSIS — Z79899 Other long term (current) drug therapy: Secondary | ICD-10-CM | POA: Diagnosis not present

## 2022-10-20 DIAGNOSIS — J984 Other disorders of lung: Secondary | ICD-10-CM | POA: Diagnosis not present

## 2022-10-20 DIAGNOSIS — R634 Abnormal weight loss: Secondary | ICD-10-CM | POA: Diagnosis not present

## 2022-10-20 DIAGNOSIS — D649 Anemia, unspecified: Secondary | ICD-10-CM | POA: Diagnosis not present

## 2022-10-20 DIAGNOSIS — D500 Iron deficiency anemia secondary to blood loss (chronic): Secondary | ICD-10-CM

## 2022-10-20 DIAGNOSIS — J479 Bronchiectasis, uncomplicated: Secondary | ICD-10-CM | POA: Diagnosis not present

## 2022-10-20 DIAGNOSIS — Z95828 Presence of other vascular implants and grafts: Secondary | ICD-10-CM | POA: Diagnosis not present

## 2022-10-20 DIAGNOSIS — R911 Solitary pulmonary nodule: Secondary | ICD-10-CM | POA: Diagnosis not present

## 2022-10-20 LAB — CBC WITH DIFFERENTIAL (CANCER CENTER ONLY)
Abs Immature Granulocytes: 0.03 10*3/uL (ref 0.00–0.07)
Basophils Absolute: 0.1 10*3/uL (ref 0.0–0.1)
Basophils Relative: 1 %
Eosinophils Absolute: 0.3 10*3/uL (ref 0.0–0.5)
Eosinophils Relative: 5 %
HCT: 34.7 % — ABNORMAL LOW (ref 39.0–52.0)
Hemoglobin: 10.9 g/dL — ABNORMAL LOW (ref 13.0–17.0)
Immature Granulocytes: 1 %
Lymphocytes Relative: 12 %
Lymphs Abs: 0.8 10*3/uL (ref 0.7–4.0)
MCH: 30.6 pg (ref 26.0–34.0)
MCHC: 31.4 g/dL (ref 30.0–36.0)
MCV: 97.5 fL (ref 80.0–100.0)
Monocytes Absolute: 0.6 10*3/uL (ref 0.1–1.0)
Monocytes Relative: 10 %
Neutro Abs: 4.3 10*3/uL (ref 1.7–7.7)
Neutrophils Relative %: 71 %
Platelet Count: 231 10*3/uL (ref 150–400)
RBC: 3.56 MIL/uL — ABNORMAL LOW (ref 4.22–5.81)
RDW: 19.1 % — ABNORMAL HIGH (ref 11.5–15.5)
WBC Count: 6.1 10*3/uL (ref 4.0–10.5)
nRBC: 0 % (ref 0.0–0.2)

## 2022-10-20 LAB — IRON AND IRON BINDING CAPACITY (CC-WL,HP ONLY)
Iron: 42 ug/dL — ABNORMAL LOW (ref 45–182)
Saturation Ratios: 15 % — ABNORMAL LOW (ref 17.9–39.5)
TIBC: 286 ug/dL (ref 250–450)
UIBC: 244 ug/dL (ref 117–376)

## 2022-10-20 LAB — FERRITIN: Ferritin: 322 ng/mL (ref 24–336)

## 2022-10-20 NOTE — Addendum Note (Signed)
Addended by: Princella Ion on: 10/20/2022 09:13 AM   Modules accepted: Orders

## 2022-10-20 NOTE — Progress Notes (Signed)
[facility] [phone]   [phone]  OFFICE PROGRESS NOTE  Excell Seltzer, MD [address] 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  He has no improvement on the oral iron supplement.  PRIOR THERAPY: Iron infusion with Venofer 300 Mg IV weekly for 3 weeks.  Last dose was giving July 02, 2022  CURRENT THERAPY: Observation.  INTERVAL HISTORY: Erik [name]. 82 y.o. male returns to the clinic today for follow-up visit accompanied by his wife.  The patient is feeling fine today with no concerning complaints except for anemia.  He was seen recently at the hospital with significant fatigue and weakness and his hemoglobin was down to 6.5 on 10/14/2022.  He received 2 units of PRBCs transfusion at the emergency department.  The patient was treated with iron infusion at the [facility] with Venofer.  He denied having any current chest pain, shortness breath, but has cough with no hemoptysis.  He has no nausea, vomiting, diarrhea or constipation.  He lost a lot of weight over 30 pounds in the last 2 years.  He had a bone marrow biopsy and aspirate performed recently and he is here for evaluation and discussion of his lab results.  MEDICAL HISTORY: Past Medical History:  Diagnosis Date   Adenomatous colon polyp    Aortic aneurysm (HCC)    BPH (benign prostatic hypertrophy)    CAD (coronary artery disease)    Carotid artery stenosis    a. Bilateral CEA   Cataract    CHF (congestive heart failure) (HCC)    Chronic systolic heart failure (HCC)    a. EF 20-25%, mild LVH, mod HK, mid apicalanteroseptal myocardium, mild MR, LA mod dilated   Collagen vascular disease (HCC)    COPD (chronic obstructive pulmonary disease) (HCC)    Coronary artery disease    a. LHC (08/2013): Lmain: short 30% distal, LAD: sml D1 & D2, 70% ostial D1, 95-99% LAD stenosis  prox D2 LCx: sml/mod ramus subtot. occluded, 40% ostial set off lg OM1, 40% AV LCx after OM1, RCA: 90% prox (DES to RCA and prox LAD)   Diverticulosis    GERD (gastroesophageal reflux disease)    Heart murmur    History of colonic polyps    Hyperlipidemia    Hyperplastic colon polyp    Hypertension    Ischemic cardiomyopathy    RLS (restless legs syndrome)     ALLERGIES:  is allergic to codeine.  MEDICATIONS:  Current Outpatient Medications  Medication Sig Dispense Refill   acetaminophen (TYLENOL) 500 MG tablet Take 1,000 mg by mouth at bedtime.     aspirin EC 81 MG tablet Take 81 mg by mouth every evening.      azithromycin (ZITHROMAX) 250 MG tablet Take 2 tablets by mouth today, then 1 tablet daily for 4 additional days. 6 tablet 0   benzonatate (TESSALON) 200 MG capsule Take 1 capsule (200 mg total) by mouth 3 (three) times daily as needed for cough. 30 capsule 0   clopidogrel (PLAVIX) 75 MG tablet TAKE 1 TABLET BY MOUTH EVERY DAY 90 tablet 3   cyanocobalamin (VITAMIN B12) 1000 MCG tablet Take 1,000 mcg by mouth daily.     dapagliflozin propanediol (FARXIGA) 10 MG TABS tablet Take 1 tablet (10 mg total) by mouth daily before breakfast. 30 tablet 11   ezetimibe-simvastatin (VYTORIN) 10-40 MG tablet TAKE 1  TABLET BY MOUTH EVERYDAY AT BEDTIME 90 tablet 3   famotidine (PEPCID) 40 MG tablet Take 40 mg by mouth 2 (two) times daily.     ferrous sulfate 325 (65 FE) MG tablet Take 325 mg by mouth daily with breakfast.     gabapentin (NEURONTIN) 100 MG capsule Take 1 capsule (100 mg total) by mouth at bedtime. 90 capsule 3   methocarbamol (ROBAXIN) 500 MG tablet TAKE 1 TABLET BY MOUTH EVERY DAY AT BEDTIME AS NEEDED FOR MUSCLE SPASMS 30 tablet 0   metoprolol succinate (TOPROL-XL) 50 MG 24 hr tablet TAKE 1 TABLET BY MOUTH EVERY DAY WITH OR IMMEDIATELY FOLLOWING MEAL (NEEDS OFFICE VISIT) 180 tablet 0   Omega-3 Fatty Acids (FISH OIL) 1000 MG CAPS Take 1 capsule by mouth daily.     predniSONE  (DELTASONE) 20 MG tablet Take 2 tablets by mouth once daily for 5 days. 10 tablet 0   rOPINIRole (REQUIP) 3 MG tablet TAKE 1/2 TABLET BY MOUTH IN THE MORNING, 1/2 TABLET MID DAY & TAKE 1 TABLET AT NIGHT 180 tablet 1   spironolactone (ALDACTONE) 25 MG tablet TAKE 1/2 TABLET BY MOUTH EVERY DAY 45 tablet 3   tamsulosin (FLOMAX) 0.4 MG CAPS capsule TAKE 1 CAPSULE BY MOUTH EVERY DAY 90 capsule 3   No current facility-administered medications for this visit.    SURGICAL HISTORY:  Past Surgical History:  Procedure Laterality Date   BUBBLE STUDY  02/03/2022   Procedure: BUBBLE STUDY;  Surgeon: Meriam Sprague, MD;  [facility] ENDOSCOPY;  Service: Cardiovascular;;   cardiac stents  09-2013   CAROTID ENDARTERECTOMY  04/17/2008   right   CAROTID ENDARTERECTOMY  05/30/08   Left   CATARACT EXTRACTION W/PHACO Right 10/05/2018   Procedure: CATARACT EXTRACTION PHACO AND INTRAOCULAR LENS PLACEMENT (IOC), RIGHT;  Surgeon: Galen Manila, MD;  Location: [facility] ORS;  Service: Ophthalmology;  Laterality: Right;  [place] 01:06.4 cde 12.79 Fluid Pack Lot # [id number]   CATARACT EXTRACTION W/PHACO Left 11/02/2018   Procedure: CATARACT EXTRACTION PHACO AND INTRAOCULAR LENS PLACEMENT (IOC), LEFT;  Surgeon: Galen Manila, MD;  Location: [facility] ORS;  Service: Ophthalmology;  Laterality: Left;  [place]  01:10 CDE 11.52 Fluid pack lot # [id number]   CHOLECYSTECTOMY     Gall Bladder   CORONARY ANGIOGRAM  09/11/2013   Procedure: CORONARY ANGIOGRAM;  Surgeon: Lennette Bihari, MD;  Location: [facility] CATH LAB;  Service: Cardiovascular;;   CORONARY ANGIOPLASTY     STENTS   HERNIA REPAIR     lower aorta aneurysm  05/26/2015   [facility]   PERCUTANEOUS STENT INTERVENTION  09/11/2013   Procedure: PERCUTANEOUS STENT INTERVENTION;  Surgeon: Lennette Bihari, MD;  Location: [facility] CATH LAB;  Service: Cardiovascular;;  DES Prox RCA 3.5x15 xience    TEE WITHOUT CARDIOVERSION N/A 02/03/2022   Procedure: TRANSESOPHAGEAL ECHOCARDIOGRAM (TEE);   Surgeon: Meriam Sprague, MD;  Location: [facility] ENDOSCOPY;  Service: Cardiovascular;  Laterality: N/A;    REVIEW OF SYSTEMS:  Constitutional: positive for fatigue and weight loss Eyes: negative Ears, nose, mouth, throat, and face: negative Respiratory: positive for cough Cardiovascular: negative Gastrointestinal: negative Genitourinary:negative Integument/breast: negative Hematologic/lymphatic: negative Musculoskeletal:negative Neurological: negative Behavioral/Psych: negative Endocrine: negative Allergic/Immunologic: negative   PHYSICAL EXAMINATION: General appearance: alert, cooperative, fatigued, and no distress Head: Normocephalic, without obvious abnormality, atraumatic Neck: no adenopathy, no JVD, supple, symmetrical, trachea midline, and thyroid not enlarged, symmetric, no tenderness/mass/nodules Lymph nodes: Cervical, supraclavicular, and axillary nodes normal. Resp: clear to auscultation bilaterally Back: symmetric, no curvature. ROM normal.  No CVA tenderness. Cardio: regular rate and rhythm, S1, S2 normal, no murmur, click, rub or gallop GI: soft, non-tender; bowel sounds normal; no masses,  no organomegaly Extremities: extremities normal, atraumatic, no cyanosis or edema Neurologic: Alert and oriented X 3, normal strength and tone. Normal symmetric reflexes. Normal coordination and gait  ECOG PERFORMANCE STATUS: 1 - Symptomatic but completely ambulatory  Blood pressure (!) 126/45, pulse 60, temperature 97.8 F (36.6 C), temperature source Oral, resp. rate 16, weight 109 lb 4.8 oz (49.6 kg), SpO2 98%.  LABORATORY DATA: Lab Results  Component Value Date   WBC 6.8 10/14/2022   HGB 6.5 (LL) 10/14/2022   HCT 22.4 (L) 10/14/2022   MCV 101.8 (H) 10/14/2022   PLT 316 10/14/2022      Chemistry      Component Value Date/Time   NA 136 08/19/2022 0819   NA 138 08/18/2018 1135   K 4.8 08/19/2022 0819   CL 103 08/19/2022 0819   CO2 27 08/19/2022 0819   BUN 16  08/19/2022 0819   BUN 19 08/18/2018 1135   CREATININE 1.00 08/19/2022 0819   CREATININE 1.11 10/09/2021 1646      Component Value Date/Time   CALCIUM 8.7 08/19/2022 0819   ALKPHOS 60 08/19/2022 0819   AST 17 08/19/2022 0819   ALT 9 08/19/2022 0819   BILITOT 0.3 08/19/2022 0819       RADIOGRAPHIC STUDIES: CT BONE MARROW BIOPSY & ASPIRATION  Result Date: 10/14/2022 INDICATION: Anemia EXAM: CT GUIDED BONE MARROW ASPIRATES AND BIOPSY MEDICATIONS: None. ANESTHESIA/SEDATION: Versed 2 mg IV Fentanyl 100 mcg IV Moderate Sedation Time:  10 minutes The patient was continuously monitored during the procedure by the interventional radiology nurse under my direct supervision. TECHNIQUE: Multidetector CT imaging of the pelvis was performed following the standard protocol without IV contrast. RADIATION DOSE REDUCTION: This exam was performed according to the departmental dose-optimization program which includes automated exposure control, adjustment of the mA and/or kV according to patient size and/or use of iterative reconstruction technique. COMPLICATIONS: None immediate. PROCEDURE: The procedure was explained to the patient. The risks and benefits of the procedure were discussed and the patient's questions were addressed. Informed consent was obtained from the patient. The patient was placed prone on CT table. Images of the pelvis were obtained. The right side of back was prepped and draped in sterile fashion. The skin and right posterior ilium were anesthetized with 1% lidocaine. 11 gauge bone needle was directed into the right ilium with CT guidance. Two aspirates and 1 core biopsy were obtained. Bandage placed over the puncture site. IMPRESSION: CT guided bone marrow aspiration and core biopsy. Electronically Signed   By: Acquanetta Belling M.D.   On: 10/14/2022 13:02    ASSESSMENT AND PLAN: This is a very pleasant 82 years old white male with persistent anemia likely anemia of chronic disease plus iron  deficiency.  He was treated with iron infusion recently.  He also received 2 units of PRBCs transfusion The patient is also on oral iron tablet with ferrous sulfate once daily with no improvement in his anemia. The patient had a bone marrow biopsy and aspirate that showed no concerning finding for MDS or myeloproliferative disorder. I recommended for the patient to have repeat CBC, iron study and ferritin today. For the persistent weight loss that is unexplained, I will also check chest x-ray on him today. I will see the patient back for follow-up visit in 1 months for evaluation and repeat blood work. He was  advised to call immediately if he has any other concerning symptoms in the interval. The patient voices understanding of current disease status and treatment options and is in agreement with the current care plan.  All questions were answered. The patient knows to call the clinic with any problems, questions or concerns. We can certainly see the patient much sooner if necessary.  The total time spent in the appointment was 30 minutes.  Disclaimer: This note was dictated with voice recognition software. Similar sounding words can inadvertently be transcribed and may not be corrected upon review.

## 2022-10-21 ENCOUNTER — Ambulatory Visit: Payer: Medicare Other | Admitting: Internal Medicine

## 2022-10-23 ENCOUNTER — Other Ambulatory Visit: Payer: Self-pay | Admitting: Family Medicine

## 2022-10-25 NOTE — Telephone Encounter (Signed)
Last office visit 09/27/22 with [facility] for acute cough.  Last refilled 10/05/22 for #30 with no refills.  Next Appt: No future appointments with PCP.

## 2022-11-03 ENCOUNTER — Other Ambulatory Visit (HOSPITAL_COMMUNITY): Payer: Self-pay | Admitting: Cardiology

## 2022-11-07 ENCOUNTER — Other Ambulatory Visit (HOSPITAL_COMMUNITY): Payer: Self-pay | Admitting: Cardiology

## 2022-11-09 DIAGNOSIS — H35341 Macular cyst, hole, or pseudohole, right eye: Secondary | ICD-10-CM | POA: Diagnosis not present

## 2022-11-09 DIAGNOSIS — Z961 Presence of intraocular lens: Secondary | ICD-10-CM | POA: Diagnosis not present

## 2022-11-17 ENCOUNTER — Other Ambulatory Visit: Payer: Self-pay | Admitting: Family Medicine

## 2022-11-17 MED ORDER — METHOCARBAMOL 500 MG PO TABS: 500.0000 mg | ORAL_TABLET | Freq: Every evening | ORAL | 0 refills | Status: DC | PRN

## 2022-11-17 NOTE — Telephone Encounter (Signed)
Last office 08/26/22 for MWV.  Last refilled 10/05/2022 for #30 with no refills.  Next Appt: No future appointments with PCP.

## 2022-11-17 NOTE — Telephone Encounter (Signed)
Prescription Request  11/17/2022  LOV: 08/26/2022  What is the name of the medication or equipment? methocarbamol (ROBAXIN) 500 MG tablet   Have you contacted your pharmacy to request a refill? Yes   Which pharmacy would you like this sent to?  [id number] [place]ann Sheen, [place] - [address] [place] [address] Phone: [phone] Fax: [phone]    Patient notified that their request is being sent to the clinical staff for review and that they should receive a response within 2 business days.   Please advise at [facility] [phone]

## 2022-11-24 ENCOUNTER — Inpatient Hospital Stay: Payer: Medicare Other | Attending: Internal Medicine

## 2022-11-24 ENCOUNTER — Inpatient Hospital Stay (HOSPITAL_BASED_OUTPATIENT_CLINIC_OR_DEPARTMENT_OTHER): Payer: Medicare Other | Admitting: Internal Medicine

## 2022-11-24 VITALS — BP 178/50 | HR 58 | Temp 98.2°F | Resp 16 | Ht 66.0 in | Wt 112.9 lb

## 2022-11-24 DIAGNOSIS — Z79899 Other long term (current) drug therapy: Secondary | ICD-10-CM | POA: Insufficient documentation

## 2022-11-24 DIAGNOSIS — D500 Iron deficiency anemia secondary to blood loss (chronic): Secondary | ICD-10-CM

## 2022-11-24 DIAGNOSIS — D649 Anemia, unspecified: Secondary | ICD-10-CM | POA: Insufficient documentation

## 2022-11-24 LAB — CBC WITH DIFFERENTIAL (CANCER CENTER ONLY)
Abs Immature Granulocytes: 0.02 10*3/uL (ref 0.00–0.07)
Basophils Absolute: 0.1 10*3/uL (ref 0.0–0.1)
Basophils Relative: 1 %
Eosinophils Absolute: 0.2 10*3/uL (ref 0.0–0.5)
Eosinophils Relative: 3 %
HCT: 34.6 % — ABNORMAL LOW (ref 39.0–52.0)
Hemoglobin: 10.8 g/dL — ABNORMAL LOW (ref 13.0–17.0)
Immature Granulocytes: 0 %
Lymphocytes Relative: 13 %
Lymphs Abs: 1 10*3/uL (ref 0.7–4.0)
MCH: 29.4 pg (ref 26.0–34.0)
MCHC: 31.2 g/dL (ref 30.0–36.0)
MCV: 94.3 fL (ref 80.0–100.0)
Monocytes Absolute: 0.7 10*3/uL (ref 0.1–1.0)
Monocytes Relative: 10 %
Neutro Abs: 5.2 10*3/uL (ref 1.7–7.7)
Neutrophils Relative %: 73 %
Platelet Count: 294 10*3/uL (ref 150–400)
RBC: 3.67 MIL/uL — ABNORMAL LOW (ref 4.22–5.81)
RDW: 15.9 % — ABNORMAL HIGH (ref 11.5–15.5)
WBC Count: 7.1 10*3/uL (ref 4.0–10.5)
nRBC: 0 % (ref 0.0–0.2)

## 2022-11-24 LAB — IRON AND IRON BINDING CAPACITY (CC-WL,HP ONLY)
Iron: 32 ug/dL — ABNORMAL LOW (ref 45–182)
Saturation Ratios: 10 % — ABNORMAL LOW (ref 17.9–39.5)
TIBC: 312 ug/dL (ref 250–450)
UIBC: 280 ug/dL (ref 117–376)

## 2022-11-24 LAB — VITAMIN B12: Vitamin B-12: 993 pg/mL — ABNORMAL HIGH (ref 180–914)

## 2022-11-24 LAB — FOLATE: Folate: 16.9 ng/mL (ref >5.9)

## 2022-11-24 LAB — FERRITIN: Ferritin: 71 ng/mL (ref 24–336)

## 2022-11-24 NOTE — Progress Notes (Signed)
[facility] [phone]   [phone]  OFFICE PROGRESS NOTE  Excell Seltzer, MD [address] [place] [address]  DIAGNOSIS: Anemia of chronic disease +/- iron deficiency secondary to gastrointestinal blood loss.  He has no improvement on the oral iron supplement.  PRIOR THERAPY: Iron infusion with Venofer 300 Mg IV weekly for 3 weeks.  Last dose was giving July 02, 2022  CURRENT THERAPY: Over-the-counter ferrous sulfate 325 mg p.o. daily with orange juice  INTERVAL HISTORY: Erik [name]. 82 y.o. male returns to the clinic today for follow-up visit accompanied by his wife.  The patient is feeling fine today with no concerning complaints except for mild fatigue and occasional dizzy spells.  He denied having any chest pain, shortness of breath, cough or hemoptysis.  He denied having any fever or chills.  He has no nausea, vomiting, diarrhea or constipation.  He has no headache or visual changes.  He has no recent weight loss or night sweats.  He continues to tolerate the over-the-counter ferrous sulfate fairly well.  He is here today for evaluation and repeat blood work.   MEDICAL HISTORY: Past Medical History:  Diagnosis Date   Adenomatous colon polyp    Aortic aneurysm (HCC)    BPH (benign prostatic hypertrophy)    CAD (coronary artery disease)    Carotid artery stenosis    a. Bilateral CEA   Cataract    CHF (congestive heart failure) (HCC)    Chronic systolic heart failure (HCC)    a. EF 20-25%, mild LVH, mod HK, mid apicalanteroseptal myocardium, mild MR, LA mod dilated   Collagen vascular disease (HCC)    COPD (chronic obstructive pulmonary disease) (HCC)    Coronary artery disease    a. LHC (08/2013): Lmain: short 30% distal, LAD: sml D1 & D2, 70% ostial D1, 95-99% LAD stenosis prox D2 LCx: sml/mod ramus subtot. occluded, 40% ostial set off lg OM1, 40% AV LCx after OM1, RCA: 90% prox (DES to RCA and prox LAD)    Diverticulosis    GERD (gastroesophageal reflux disease)    Heart murmur    History of colonic polyps    Hyperlipidemia    Hyperplastic colon polyp    Hypertension    Ischemic cardiomyopathy    RLS (restless legs syndrome)     ALLERGIES:  is allergic to codeine.  MEDICATIONS:  Current Outpatient Medications  Medication Sig Dispense Refill   acetaminophen (TYLENOL) 500 MG tablet Take 1,000 mg by mouth at bedtime.     aspirin EC 81 MG tablet Take 81 mg by mouth every evening.      azithromycin (ZITHROMAX) 250 MG tablet Take 2 tablets by mouth today, then 1 tablet daily for 4 additional days. 6 tablet 0   clopidogrel (PLAVIX) 75 MG tablet TAKE 1 TABLET BY MOUTH EVERY DAY 90 tablet 3   cyanocobalamin (VITAMIN B12) 1000 MCG tablet Take 1,000 mcg by mouth daily.     dapagliflozin propanediol (FARXIGA) 10 MG TABS tablet Take 1 tablet (10 mg total) by mouth daily before breakfast. 30 tablet 11   ezetimibe-simvastatin (VYTORIN) 10-40 MG tablet TAKE 1 TABLET BY MOUTH EVERYDAY AT BEDTIME 90 tablet 3   famotidine (PEPCID) 40 MG tablet Take 40 mg by mouth 2 (two) times daily.     ferrous sulfate 325 (65 FE) MG tablet Take 325 mg by mouth daily with breakfast.     gabapentin (NEURONTIN) 100 MG capsule Take  1 capsule (100 mg total) by mouth at bedtime. 90 capsule 3   methocarbamol (ROBAXIN) 500 MG tablet Take 1 tablet (500 mg total) by mouth at bedtime as needed for muscle spasms. 30 tablet 0   metoprolol succinate (TOPROL-XL) 50 MG 24 hr tablet TAKE 1 TABLET BY MOUTH EVERY DAY WITH OR IMMEDIATELY FOLLOWING MEAL (NEEDS OFFICE VISIT) 180 tablet 0   Omega-3 Fatty Acids (FISH OIL) 1000 MG CAPS Take 1 capsule by mouth daily.     rOPINIRole (REQUIP) 3 MG tablet TAKE 1/2 TABLET BY MOUTH IN THE MORNING, 1/2 TABLET MID DAY & TAKE 1 TABLET AT NIGHT 180 tablet 1   spironolactone (ALDACTONE) 25 MG tablet TAKE 1/2 TABLET BY MOUTH DAILY 45 tablet 1   tamsulosin (FLOMAX) 0.4 MG CAPS capsule TAKE 1 CAPSULE BY MOUTH  EVERY DAY 90 capsule 3   No current facility-administered medications for this visit.    SURGICAL HISTORY:  Past Surgical History:  Procedure Laterality Date   BUBBLE STUDY  02/03/2022   Procedure: BUBBLE STUDY;  Surgeon: Meriam Sprague, MD;  Location: [facility] ENDOSCOPY;  Service: Cardiovascular;;   cardiac stents  09-2013   CAROTID ENDARTERECTOMY  04/17/2008   right   CAROTID ENDARTERECTOMY  05/30/08   Left   CATARACT EXTRACTION W/PHACO Right 10/05/2018   Procedure: CATARACT EXTRACTION PHACO AND INTRAOCULAR LENS PLACEMENT (IOC), RIGHT;  Surgeon: Galen Manila, MD;  Location: [facility] ORS;  Service: Ophthalmology;  Laterality: Right;  [place] 01:06.4 cde 12.79 Fluid Pack Lot # [id number]   CATARACT EXTRACTION W/PHACO Left 11/02/2018   Procedure: CATARACT EXTRACTION PHACO AND INTRAOCULAR LENS PLACEMENT (IOC), LEFT;  Surgeon: Galen Manila, MD;  Location: [facility] ORS;  Service: Ophthalmology;  Laterality: Left;  [place]  01:10 CDE 11.52 Fluid pack lot # [id number]   CHOLECYSTECTOMY     Gall Bladder   CORONARY ANGIOGRAM  09/11/2013   Procedure: CORONARY ANGIOGRAM;  Surgeon: Lennette Bihari, MD;  Location: [facility] CATH LAB;  Service: Cardiovascular;;   CORONARY ANGIOPLASTY     STENTS   HERNIA REPAIR     lower aorta aneurysm  05/26/2015   [facility]   PERCUTANEOUS STENT INTERVENTION  09/11/2013   Procedure: PERCUTANEOUS STENT INTERVENTION;  Surgeon: Lennette Bihari, MD;  Location: [facility] CATH LAB;  Service: Cardiovascular;;  DES Prox RCA 3.5x15 xience    TEE WITHOUT CARDIOVERSION N/A 02/03/2022   Procedure: TRANSESOPHAGEAL ECHOCARDIOGRAM (TEE);  Surgeon: Meriam Sprague, MD;  Location: [facility] ENDOSCOPY;  Service: Cardiovascular;  Laterality: N/A;    REVIEW OF SYSTEMS:  A comprehensive review of systems was negative except for: Constitutional: positive for fatigue Neurological: positive for dizziness   PHYSICAL EXAMINATION: General appearance: alert, cooperative, fatigued, and no distress Head: Normocephalic,  without obvious abnormality, atraumatic Neck: no adenopathy, no JVD, supple, symmetrical, trachea midline, and thyroid not enlarged, symmetric, no tenderness/mass/nodules Lymph nodes: Cervical, supraclavicular, and axillary nodes normal. Resp: clear to auscultation bilaterally Back: symmetric, no curvature. ROM normal. No CVA tenderness. Cardio: regular rate and rhythm, S1, S2 normal, no murmur, click, rub or gallop GI: soft, non-tender; bowel sounds normal; no masses,  no organomegaly Extremities: extremities normal, atraumatic, no cyanosis or edema  ECOG PERFORMANCE STATUS: 1 - Symptomatic but completely ambulatory  Blood pressure (!) 166/67, pulse (!) 58, temperature 98.2 F (36.8 C), temperature source Oral, resp. rate 16, height 5\' 6"  (1.676 m), weight 112 lb 14.4 oz (51.2 kg), SpO2 100%.  LABORATORY DATA: Lab Results  Component Value Date   WBC 7.1 11/24/2022  HGB 10.8 (L) 11/24/2022   HCT 34.6 (L) 11/24/2022   MCV 94.3 11/24/2022   PLT 294 11/24/2022      Chemistry      Component Value Date/Time   NA 136 08/19/2022 0819   NA 138 08/18/2018 1135   K 4.8 08/19/2022 0819   CL 103 08/19/2022 0819   CO2 27 08/19/2022 0819   BUN 16 08/19/2022 0819   BUN 19 08/18/2018 1135   CREATININE 1.00 08/19/2022 0819   CREATININE 1.11 10/09/2021 1646      Component Value Date/Time   CALCIUM 8.7 08/19/2022 0819   ALKPHOS 60 08/19/2022 0819   AST 17 08/19/2022 0819   ALT 9 08/19/2022 0819   BILITOT 0.3 08/19/2022 0819       RADIOGRAPHIC STUDIES: No results found.  ASSESSMENT AND PLAN: This is a very pleasant 82 years old white male with persistent anemia likely anemia of chronic disease plus iron deficiency.  He was treated with iron infusion recently.  He also received 2 units of PRBCs transfusion The patient is also on oral iron tablet with ferrous sulfate once daily with no improvement in his anemia. The patient had a bone marrow biopsy and aspirate that showed no concerning  finding for MDS or myeloproliferative disorder. He continues to tolerate the oral iron tablets fairly well. Repeat CBC today showed stable hemoglobin of 10.8 and hematocrit 34.6%.  Iron study and ferritin are still pending I recommended for the patient to continue his current treatment with the oral ferrous sulfate. I will see him back for follow-up visit in 6 months for evaluation with repeat blood work. He was advised to call immediately if he has any other concerning symptoms in the interval. The patient voices understanding of current disease status and treatment options and is in agreement with the current care plan.  All questions were answered. The patient knows to call the clinic with any problems, questions or concerns. We can certainly see the patient much sooner if necessary.  The total time spent in the appointment was 20 minutes.  Disclaimer: This note was dictated with voice recognition software. Similar sounding words can inadvertently be transcribed and may not be corrected upon review.

## 2022-11-27 ENCOUNTER — Other Ambulatory Visit (HOSPITAL_COMMUNITY): Payer: Self-pay | Admitting: Cardiology

## 2022-12-14 ENCOUNTER — Other Ambulatory Visit: Payer: Self-pay | Admitting: Family Medicine

## 2022-12-24 ENCOUNTER — Other Ambulatory Visit: Payer: Self-pay | Admitting: Family Medicine

## 2022-12-27 NOTE — Telephone Encounter (Signed)
Last office visit 09/27/2022 with Allayne Gitelman for acute cough.  Last refilled 12/15/2022 for #30 with no refills.  Next Appt: No future appointments with PCP.

## 2022-12-30 ENCOUNTER — Emergency Department (HOSPITAL_BASED_OUTPATIENT_CLINIC_OR_DEPARTMENT_OTHER): Payer: Medicare Other | Admitting: Radiology

## 2022-12-30 ENCOUNTER — Encounter (HOSPITAL_BASED_OUTPATIENT_CLINIC_OR_DEPARTMENT_OTHER): Payer: Self-pay

## 2022-12-30 ENCOUNTER — Emergency Department (HOSPITAL_BASED_OUTPATIENT_CLINIC_OR_DEPARTMENT_OTHER)
Admission: EM | Admit: 2022-12-30 | Discharge: 2022-12-30 | Disposition: A | Discharge status: Home / Self Care | Payer: Medicare Other | Attending: Emergency Medicine | Admitting: Emergency Medicine

## 2022-12-30 ENCOUNTER — Other Ambulatory Visit: Payer: Self-pay

## 2022-12-30 DIAGNOSIS — Z23 Encounter for immunization: Secondary | ICD-10-CM | POA: Insufficient documentation

## 2022-12-30 DIAGNOSIS — S62631A Displaced fracture of distal phalanx of left index finger, initial encounter for closed fracture: Secondary | ICD-10-CM | POA: Diagnosis not present

## 2022-12-30 DIAGNOSIS — Z7982 Long term (current) use of aspirin: Secondary | ICD-10-CM | POA: Insufficient documentation

## 2022-12-30 DIAGNOSIS — S61211A Laceration without foreign body of left index finger without damage to nail, initial encounter: Secondary | ICD-10-CM | POA: Insufficient documentation

## 2022-12-30 DIAGNOSIS — S6992XA Unspecified injury of left wrist, hand and finger(s), initial encounter: Secondary | ICD-10-CM | POA: Diagnosis present

## 2022-12-30 DIAGNOSIS — W293XXA Contact with powered garden and outdoor hand tools and machinery, initial encounter: Secondary | ICD-10-CM | POA: Diagnosis not present

## 2022-12-30 DIAGNOSIS — Z7902 Long term (current) use of antithrombotics/antiplatelets: Secondary | ICD-10-CM | POA: Diagnosis not present

## 2022-12-30 DIAGNOSIS — S62639A Displaced fracture of distal phalanx of unspecified finger, initial encounter for closed fracture: Secondary | ICD-10-CM | POA: Diagnosis not present

## 2022-12-30 MED ORDER — CEFAZOLIN SODIUM-DEXTROSE 2-4 GM/100ML-% IV SOLN
2.0000 g | Freq: Once | INTRAVENOUS | Status: AC
  Administered 2022-12-30: 2 g via INTRAVENOUS
  Filled 2022-12-30: qty 100

## 2022-12-30 MED ORDER — TETANUS-DIPHTH-ACELL PERTUSSIS 5-2.5-18.5 LF-MCG/0.5 IM SUSY
0.5000 mL | PREFILLED_SYRINGE | Freq: Once | INTRAMUSCULAR | Status: AC
  Administered 2022-12-30: 0.5 mL via INTRAMUSCULAR
  Filled 2022-12-30: qty 0.5

## 2022-12-30 MED ORDER — LIDOCAINE HCL (PF) 1 % IJ SOLN
15.0000 mL | Freq: Once | INTRAMUSCULAR | Status: DC
  Filled 2022-12-30: qty 15

## 2022-12-30 MED ORDER — AMOXICILLIN-POT CLAVULANATE 875-125 MG PO TABS: 1.0000 | ORAL_TABLET | Freq: Two times a day (BID) | ORAL | 0 refills | Status: AC

## 2022-12-30 MED ORDER — ACETAMINOPHEN 500 MG PO TABS
1000.0000 mg | ORAL_TABLET | Freq: Once | ORAL | Status: DC
  Filled 2022-12-30: qty 2

## 2022-12-30 NOTE — Discharge Instructions (Addendum)
You had absorbable sutures placed today in your finger.  You need to follow-up with the hand team on Monday, 01/03/2023.  I have already spoken to the PA there, Payton Mccallum.  You need to call their office listed below and schedule this appointment for Monday.  You may gently clean the area around your laceration as needed with soap and water.  However, please keep the area dry.  Do not apply an antibiotic ointment to your laceration as this can weaken the sutures.  Keep the laceration covered with sterile gauze as shown here if you are doing an activity in which it may get dirty. You may pick these supplies up at any drugstore.  Do not submerge your laceration in water (no baths, swimming) until it is fully healed. You may shower.   Please wear the finger splint provided at all times.  You may take this off for hygiene (to wash your hands and shower).   You have been prescribed Augmentin. Take this antibiotic 2 times a day for the next 7 days. Take the full course of your antibiotic even if you start feeling better. Antibiotics may cause you to have diarrhea.  You may take up to 1000mg  of tylenol every 6 hours as needed for pain.  Do not take more then 4g per day.  Return to the ER should you develop fever, chills, pus drainage from your wound, redness around your wound.

## 2022-12-30 NOTE — ED Notes (Signed)
Finger wrapped with non-stick, guaze, kling, tape and splint applied.

## 2022-12-30 NOTE — ED Triage Notes (Signed)
Pt POV from home after cutting L pointer finger with hand saw. 3 cm lac tip of finger, pt on plavix, pressure dressing applied in triage, bleeding controlled at this time.

## 2022-12-30 NOTE — ED Provider Notes (Signed)
[facility] Provider Note   CSN: [id number] Arrival date & time: 12/30/22  1618     History  Chief Complaint  Patient presents with   Laceration    Erik [name]. is a 82 y.o. male on Plavix, presents with concern for laceration to his left pointer finger that occurred at 2 PM today.  He cut the finger with a saw.  Last tetanus was 15 years ago.  Denies any numbness or tingling to the finger.  Denies any difficulty with moving his finger.   Laceration      Home Medications Prior to Admission medications   Medication Sig Start Date End Date Taking? Authorizing Provider  amoxicillin-clavulanate (AUGMENTIN) 875-125 MG tablet Take 1 tablet by mouth every 12 (twelve) hours for 7 days. 12/30/22 01/06/23 Yes Arabella Merles, PA-C  acetaminophen (TYLENOL) 500 MG tablet Take 1,000 mg by mouth at bedtime.    [provider]  aspirin EC 81 MG tablet Take 81 mg by mouth every evening.     [provider]  azithromycin (ZITHROMAX) 250 MG tablet Take 2 tablets by mouth today, then 1 tablet daily for 4 additional days. 09/27/22   Doreene Nest, NP  clopidogrel (PLAVIX) 75 MG tablet TAKE 1 TABLET BY MOUTH EVERY DAY 11/03/22   Laurey Morale, MD  cyanocobalamin (VITAMIN B12) 1000 MCG tablet Take 1,000 mcg by mouth daily.    [provider]  ezetimibe-simvastatin (VYTORIN) 10-40 MG tablet TAKE 1 TABLET BY MOUTH EVERYDAY AT BEDTIME 10/06/22   Bedsole, Amy E, MD  famotidine (PEPCID) 40 MG tablet Take 40 mg by mouth 2 (two) times daily.    [provider]  FARXIGA 10 MG TABS tablet TAKE 1 TABLET BY MOUTH DAILY BEFORE BREAKFAST. 11/29/22   Laurey Morale, MD  ferrous sulfate 325 (65 FE) MG tablet Take 325 mg by mouth daily with breakfast.    [provider]  gabapentin (NEURONTIN) 100 MG capsule Take 1 capsule (100 mg total) by mouth at bedtime. 08/26/22   Bedsole, Amy E, MD  methocarbamol (ROBAXIN)  500 MG tablet TAKE 1 TABLET (500 MG TOTAL) BY MOUTH EVERY DAY AT BEDTIME AS NEEDED FOR MUSCLE SPASM 12/28/22   Bedsole, Amy E, MD  metoprolol succinate (TOPROL-XL) 50 MG 24 hr tablet TAKE 1 TABLET BY MOUTH EVERY DAY WITH OR IMMEDIATELY FOLLOWING MEAL (NEEDS OFFICE VISIT) 10/04/22   Laurey Morale, MD  Omega-3 Fatty Acids (FISH OIL) 1000 MG CAPS Take 1 capsule by mouth daily.    [provider]  rOPINIRole (REQUIP) 3 MG tablet TAKE 1/2 TABLET BY MOUTH IN THE MORNING, 1/2 TABLET MID DAY & TAKE 1 TABLET AT NIGHT 10/12/22   Bedsole, Amy E, MD  spironolactone (ALDACTONE) 25 MG tablet TAKE 1/2 TABLET BY MOUTH DAILY 11/10/22   Laurey Morale, MD  tamsulosin (FLOMAX) 0.4 MG CAPS capsule TAKE 1 CAPSULE BY MOUTH EVERY DAY 09/08/22   Excell Seltzer, MD      Allergies    Codeine    Review of Systems   Review of Systems  Skin:  Positive for wound.    Physical Exam Updated Vital Signs BP 135/69 (BP Location: Left Arm)   Pulse 86   Temp 98 F (36.7 C) (Oral)   Resp 17   Ht 5\' 6"  (1.676 m)   Wt 49 kg   SpO2 97%   BMI 17.43 kg/m  Physical Exam Vitals and nursing note reviewed.  Constitutional:      Appearance: Normal appearance.  HENT:     Head: Atraumatic.  Cardiovascular:     Comments: 2+ radial pulses bilaterally Pulmonary:     Effort: Pulmonary effort is normal.  Musculoskeletal:     Comments: Left second digit with 3.5 cm laceration to the palmar aspect over the distal phalanx.  No involvement of the nailbed.  Laceration still bleeding upon arrival to the ER.  No obvious debris, no bone visualized  Able to flex and extend at the left second digit MCP, PIP, DIP  Intact sensation to the left second digit  Neurological:     General: No focal deficit present.     Mental Status: He is alert.  Psychiatric:        Mood and Affect: Mood normal.        Behavior: Behavior normal.     ED Results / Procedures / Treatments   Labs (all labs ordered are listed, but only abnormal  results are displayed) Labs Reviewed - No data to display  EKG None  Radiology DG Finger Index Left  Result Date: 12/30/2022 CLINICAL DATA:  Cut left pointer finger with hand saw EXAM: LEFT INDEX FINGER 2+V COMPARISON:  None Available. FINDINGS: Laceration about the tip of the left second finger. Overlying bandage material. Cortical irregularity about the tuft of the distal phalanx compatible with fracture. IMPRESSION: Laceration about the tip of the left second finger with fracture of the tuft of the distal phalanx. If there is associated nail bed injury, this should be considered an open fracture. Electronically Signed   By: Minerva Fester M.D.   On: 12/30/2022 18:10    Procedures .Marland KitchenLaceration Repair  Date/Time: 12/30/2022 10:24 PM  Performed by: Arabella Merles, PA-C Authorized by: Arabella Merles, PA-C   Consent:    Consent obtained:  Verbal   Consent given by:  Patient   Risks, benefits, and alternatives were discussed: yes     Risks discussed:  Infection, pain and poor cosmetic result   Alternatives discussed:  No treatment Universal protocol:    Procedure explained and questions answered to patient or proxy's satisfaction: yes     Imaging studies available: yes     Patient identity confirmed:  Verbally with patient Anesthesia:    Anesthesia method:  Nerve block   Block location:  Left 2nd digit   Block needle gauge:  25 G   Block anesthetic:  Lidocaine 1% w/o epi (8ml)   Block injection procedure:  Anatomic landmarks identified, introduced needle, negative aspiration for blood and incremental injection   Block outcome:  Anesthesia achieved Laceration details:    Location:  Finger   Finger location:  L index finger   Length (cm):  3.5   Depth (mm):  3 Pre-procedure details:    Preparation:  Patient was prepped and draped in usual sterile fashion Exploration:    Hemostasis achieved with:  Tourniquet and direct pressure   Imaging outcome: foreign body noted      Wound exploration: wound explored through full range of motion and entire depth of wound visualized     Wound extent: no nerve damage and no tendon damage     Contaminated: no   Treatment:    Area cleansed with:  Chlorhexidine   Amount of cleaning:  Standard   Irrigation solution:  Sterile saline   Irrigation volume:  2L   Irrigation method:  Syringe   Debridement:  None   Undermining:  Minimal Skin repair:  Repair method:  Sutures   Suture size:  6-0   Wound skin closure material used: Vicryl.   Suture technique:  Simple interrupted   Number of sutures:  12 Repair type:    Repair type:  Intermediate Post-procedure details:    Dressing:  Sterile dressing and splint for protection   Procedure completion:  Tolerated well, no immediate complications     Medications Ordered in ED Medications  acetaminophen (TYLENOL) tablet 1,000 mg (1,000 mg Oral Not Given 12/30/22 2016)  lidocaine (PF) (XYLOCAINE) 1 % injection 15 mL (has no administration in time range)  ceFAZolin (ANCEF) IVPB 2g/100 mL premix (0 g Intravenous Stopped 12/30/22 2055)  Tdap (BOOSTRIX) injection 0.5 mL (0.5 mLs Intramuscular Given 12/30/22 2019)    ED Course/ Medical Decision Making/ A&P                                 Medical Decision Making Amount and/or Complexity of Data Reviewed Radiology: ordered.  Risk OTC drugs. Prescription drug management.   82 y.o. male on plavix presents to the ED for concern of laceration to his left index finger with saw, occurred at 2 PM today  Differential diagnosis includes but is not limited to left finger laceration, tendon injury, cellulitis, fracture  ED Course:  Patient overall well-appearing, no acute distress.  Upon my initial evaluation, finger is wrapped in gauze and Coban.  This was removed and laceration evaluated.  Laceration was approximately 3.5 cm in size involving the pad of the left second digit distal phalanx.  No bone or foreign debris visualized.   Patient was able to fully flex and extend at the left second digit MCP, PIP, DIP.  Intact sensation of the left 2nd digit.  No concern for laceration or nerve injury at this time.  Bleeding was not well-controlled.  The laceration was wrapped well with gauze and compression with Coban.   Hand PA Payton Mccallum was consulted, she recommended irrigating the area well, closing with absorbable suture, and having patient follow-up in the office on 01/03/2023.  A digital block of the finger was performed.  Then a tourniquet was applied and the finger was irrigated well with 2 L sterile saline.  The laceration was closed with Vicryl simple interrupted sutures.  Bleeding well-controlled and tourniquet removed.  Patient able to fully flex and extend at the left second digit MCP, PIP, DIP after suture placement.  Intact sensation of the left second digit after suture placement.  Patient laceration wrapped with sterile gauze and he was placed in a finger splint for his distal phalanx tuft fracture of the left second digit. Patient given 2 g Ancef IV Patient on Plavix, cannot give ibuprofen for pain control.  Patient already took Tylenol just prior to arrival.  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   Impression: Left second digit laceration over the distal phalanx Left second digit distal phalanx tuft fracture  Disposition:  The patient was discharged home with instructions to follow-up with the hand office on Monday.  He was instructed to call the office to make his appointment.  Tylenol as needed for pain.  Instructed to keep the area clean and dry.  No submersion in water.  Wear the finger splint provided.  Take the Augmentin as prescribed. Return precautions given.  Imaging Studies ordered: I ordered imaging studies including x-ray left index finger I independently visualized the imaging  with scope of interpretation limited to determining acute life  threatening conditions related to emergency care. Imaging showed fracture of the tuft of the distal phalanx I agree with the radiologist interpretation    Consultations Obtained: I requested consultation with the orthopedic Dr. Jena Gauss 8:22pm,  and discussed lab and imaging findings as well as pertinent plan - they recommend: consulting hand I requested consultation with the hand team [facility],  and discussed lab and imaging findings as well as pertinent plan - they recommend: Irrigating the finger well here, suturing with absorbable sutures, follow-up in office on Monday.  Recommend having patient call to schedule appointment.           Final Clinical Impression(s) / ED Diagnoses Final diagnoses:  Laceration of left index finger without foreign body without damage to nail, initial encounter  Closed fracture of tuft of distal phalanx of left index finger    Rx / DC Orders ED Discharge Orders          Ordered    amoxicillin-clavulanate (AUGMENTIN) 875-125 MG tablet  Every 12 hours        12/30/22 2206              Arabella Merles, PA-C 12/30/22 2242    Royanne Foots, DO 01/03/23 [phone]

## 2022-12-30 NOTE — ED Notes (Addendum)
Report given to the next RN.Marland KitchenMarland Kitchen

## 2023-01-03 NOTE — Plan of Care (Signed)
[facility] Tonsillectomy/Adenoidectomy, Postoperative PEDS care plan entered in error.

## 2023-01-04 DIAGNOSIS — S61211A Laceration without foreign body of left index finger without damage to nail, initial encounter: Secondary | ICD-10-CM | POA: Diagnosis not present

## 2023-01-11 ENCOUNTER — Encounter: Payer: Medicare Other | Admitting: Pharmacist

## 2023-01-13 DIAGNOSIS — S61211A Laceration without foreign body of left index finger without damage to nail, initial encounter: Secondary | ICD-10-CM | POA: Diagnosis not present

## 2023-02-03 DIAGNOSIS — S61211A Laceration without foreign body of left index finger without damage to nail, initial encounter: Secondary | ICD-10-CM | POA: Diagnosis not present

## 2023-02-03 DIAGNOSIS — M79645 Pain in left finger(s): Secondary | ICD-10-CM | POA: Diagnosis not present

## 2023-02-06 ENCOUNTER — Other Ambulatory Visit: Payer: Self-pay | Admitting: Family Medicine

## 2023-02-07 ENCOUNTER — Other Ambulatory Visit: Payer: Self-pay | Admitting: Family Medicine

## 2023-02-07 NOTE — Telephone Encounter (Signed)
Last office visit 09/27/22 for acute cough.  Last refilled 12/28/2022 for #30 with no refills.  Next Appt: No future appointments with PCP.

## 2023-02-21 ENCOUNTER — Inpatient Hospital Stay (HOSPITAL_COMMUNITY)
Admission: EM | Admit: 2023-02-21 | Discharge: 2023-02-24 | DRG: 871 | Disposition: A | Discharge status: Home / Self Care | Payer: Medicare Other | Attending: Internal Medicine | Admitting: Internal Medicine

## 2023-02-21 ENCOUNTER — Encounter (HOSPITAL_COMMUNITY): Payer: Self-pay | Admitting: Emergency Medicine

## 2023-02-21 ENCOUNTER — Emergency Department (HOSPITAL_COMMUNITY): Payer: Medicare Other

## 2023-02-21 ENCOUNTER — Other Ambulatory Visit: Payer: Self-pay

## 2023-02-21 DIAGNOSIS — Z7982 Long term (current) use of aspirin: Secondary | ICD-10-CM

## 2023-02-21 DIAGNOSIS — Z95828 Presence of other vascular implants and grafts: Secondary | ICD-10-CM

## 2023-02-21 DIAGNOSIS — R197 Diarrhea, unspecified: Secondary | ICD-10-CM | POA: Diagnosis not present

## 2023-02-21 DIAGNOSIS — I447 Left bundle-branch block, unspecified: Secondary | ICD-10-CM | POA: Diagnosis present

## 2023-02-21 DIAGNOSIS — Z885 Allergy status to narcotic agent status: Secondary | ICD-10-CM | POA: Diagnosis not present

## 2023-02-21 DIAGNOSIS — I959 Hypotension, unspecified: Secondary | ICD-10-CM | POA: Diagnosis present

## 2023-02-21 DIAGNOSIS — J189 Pneumonia, unspecified organism: Principal | ICD-10-CM | POA: Diagnosis present

## 2023-02-21 DIAGNOSIS — D509 Iron deficiency anemia, unspecified: Secondary | ICD-10-CM | POA: Diagnosis not present

## 2023-02-21 DIAGNOSIS — A419 Sepsis, unspecified organism: Principal | ICD-10-CM | POA: Diagnosis present

## 2023-02-21 DIAGNOSIS — K521 Toxic gastroenteritis and colitis: Secondary | ICD-10-CM | POA: Diagnosis not present

## 2023-02-21 DIAGNOSIS — Z825 Family history of asthma and other chronic lower respiratory diseases: Secondary | ICD-10-CM

## 2023-02-21 DIAGNOSIS — G2581 Restless legs syndrome: Secondary | ICD-10-CM | POA: Diagnosis present

## 2023-02-21 DIAGNOSIS — N400 Enlarged prostate without lower urinary tract symptoms: Secondary | ICD-10-CM | POA: Diagnosis present

## 2023-02-21 DIAGNOSIS — F17210 Nicotine dependence, cigarettes, uncomplicated: Secondary | ICD-10-CM | POA: Diagnosis present

## 2023-02-21 DIAGNOSIS — E8729 Other acidosis: Secondary | ICD-10-CM | POA: Diagnosis not present

## 2023-02-21 DIAGNOSIS — I5022 Chronic systolic (congestive) heart failure: Secondary | ICD-10-CM | POA: Diagnosis present

## 2023-02-21 DIAGNOSIS — E1151 Type 2 diabetes mellitus with diabetic peripheral angiopathy without gangrene: Secondary | ICD-10-CM | POA: Diagnosis present

## 2023-02-21 DIAGNOSIS — Z8249 Family history of ischemic heart disease and other diseases of the circulatory system: Secondary | ICD-10-CM

## 2023-02-21 DIAGNOSIS — R918 Other nonspecific abnormal finding of lung field: Secondary | ICD-10-CM | POA: Diagnosis not present

## 2023-02-21 DIAGNOSIS — Z79899 Other long term (current) drug therapy: Secondary | ICD-10-CM

## 2023-02-21 DIAGNOSIS — R0689 Other abnormalities of breathing: Secondary | ICD-10-CM | POA: Diagnosis not present

## 2023-02-21 DIAGNOSIS — T3695XA Adverse effect of unspecified systemic antibiotic, initial encounter: Secondary | ICD-10-CM | POA: Diagnosis not present

## 2023-02-21 DIAGNOSIS — Z87892 Personal history of anaphylaxis: Secondary | ICD-10-CM

## 2023-02-21 DIAGNOSIS — Z800 Family history of malignant neoplasm of digestive organs: Secondary | ICD-10-CM

## 2023-02-21 DIAGNOSIS — I255 Ischemic cardiomyopathy: Secondary | ICD-10-CM | POA: Diagnosis present

## 2023-02-21 DIAGNOSIS — J929 Pleural plaque without asbestos: Secondary | ICD-10-CM | POA: Diagnosis not present

## 2023-02-21 DIAGNOSIS — R0602 Shortness of breath: Secondary | ICD-10-CM | POA: Diagnosis not present

## 2023-02-21 DIAGNOSIS — D649 Anemia, unspecified: Secondary | ICD-10-CM | POA: Diagnosis not present

## 2023-02-21 DIAGNOSIS — E878 Other disorders of electrolyte and fluid balance, not elsewhere classified: Secondary | ICD-10-CM | POA: Diagnosis not present

## 2023-02-21 DIAGNOSIS — I110 Hypertensive heart disease with heart failure: Secondary | ICD-10-CM | POA: Diagnosis not present

## 2023-02-21 DIAGNOSIS — R059 Cough, unspecified: Secondary | ICD-10-CM | POA: Diagnosis not present

## 2023-02-21 DIAGNOSIS — R000 Tachycardia, unspecified: Secondary | ICD-10-CM | POA: Diagnosis not present

## 2023-02-21 DIAGNOSIS — Z1152 Encounter for screening for COVID-19: Secondary | ICD-10-CM | POA: Diagnosis not present

## 2023-02-21 DIAGNOSIS — E785 Hyperlipidemia, unspecified: Secondary | ICD-10-CM | POA: Diagnosis present

## 2023-02-21 DIAGNOSIS — J440 Chronic obstructive pulmonary disease with acute lower respiratory infection: Secondary | ICD-10-CM | POA: Diagnosis present

## 2023-02-21 DIAGNOSIS — I2510 Atherosclerotic heart disease of native coronary artery without angina pectoris: Secondary | ICD-10-CM | POA: Diagnosis present

## 2023-02-21 DIAGNOSIS — K219 Gastro-esophageal reflux disease without esophagitis: Secondary | ICD-10-CM | POA: Diagnosis present

## 2023-02-21 DIAGNOSIS — R9431 Abnormal electrocardiogram [ECG] [EKG]: Secondary | ICD-10-CM | POA: Diagnosis not present

## 2023-02-21 DIAGNOSIS — Z820 Family history of epilepsy and other diseases of the nervous system: Secondary | ICD-10-CM

## 2023-02-21 DIAGNOSIS — Z7902 Long term (current) use of antithrombotics/antiplatelets: Secondary | ICD-10-CM

## 2023-02-21 DIAGNOSIS — Z955 Presence of coronary angioplasty implant and graft: Secondary | ICD-10-CM

## 2023-02-21 DIAGNOSIS — Z860101 Personal history of adenomatous and serrated colon polyps: Secondary | ICD-10-CM

## 2023-02-21 DIAGNOSIS — Z7984 Long term (current) use of oral hypoglycemic drugs: Secondary | ICD-10-CM

## 2023-02-21 DIAGNOSIS — Z83438 Family history of other disorder of lipoprotein metabolism and other lipidemia: Secondary | ICD-10-CM

## 2023-02-21 LAB — URINALYSIS, W/ REFLEX TO CULTURE (INFECTION SUSPECTED)
Bacteria, UA: NONE SEEN
Bilirubin Urine: NEGATIVE
Glucose, UA: >=500 mg/dL — AB
Hgb urine dipstick: NEGATIVE
Ketones, ur: NEGATIVE mg/dL
Leukocytes,Ua: NEGATIVE
Nitrite: NEGATIVE
Protein, ur: NEGATIVE mg/dL
Specific Gravity, Urine: 1.021 (ref 1.005–1.030)
pH: 5 (ref 5.0–8.0)

## 2023-02-21 LAB — PROTIME-INR
INR: 1.1 (ref 0.8–1.2)
Prothrombin Time: 14.7 s (ref 11.4–15.2)

## 2023-02-21 LAB — COMPREHENSIVE METABOLIC PANEL
ALT: 13 U/L (ref 0–44)
AST: 19 U/L (ref 15–41)
Albumin: 3 g/dL — ABNORMAL LOW (ref 3.5–5.0)
Alkaline Phosphatase: 63 U/L (ref 38–126)
Anion gap: 14 (ref 5–15)
BUN: 20 mg/dL (ref 8–23)
CO2: 18 mmol/L — ABNORMAL LOW (ref 22–32)
Calcium: 8.6 mg/dL — ABNORMAL LOW (ref 8.9–10.3)
Chloride: 103 mmol/L (ref 98–111)
Creatinine, Ser: 1.15 mg/dL (ref 0.61–1.24)
GFR, Estimated: >60 mL/min (ref >60)
Glucose, Bld: 141 mg/dL — ABNORMAL HIGH (ref 70–99)
Potassium: 4 mmol/L (ref 3.5–5.1)
Sodium: 135 mmol/L (ref 135–145)
Total Bilirubin: 0.4 mg/dL (ref <1.2)
Total Protein: 7.3 g/dL (ref 6.5–8.1)

## 2023-02-21 LAB — CBC WITH DIFFERENTIAL/PLATELET
Abs Immature Granulocytes: 0.06 10*3/uL (ref 0.00–0.07)
Basophils Absolute: 0.1 10*3/uL (ref 0.0–0.1)
Basophils Relative: 1 %
Eosinophils Absolute: 0 10*3/uL (ref 0.0–0.5)
Eosinophils Relative: 0 %
HCT: 31.4 % — ABNORMAL LOW (ref 39.0–52.0)
Hemoglobin: 9.7 g/dL — ABNORMAL LOW (ref 13.0–17.0)
Immature Granulocytes: 1 %
Lymphocytes Relative: 3 %
Lymphs Abs: 0.4 10*3/uL — ABNORMAL LOW (ref 0.7–4.0)
MCH: 28.1 pg (ref 26.0–34.0)
MCHC: 30.9 g/dL (ref 30.0–36.0)
MCV: 91 fL (ref 80.0–100.0)
Monocytes Absolute: 0.6 10*3/uL (ref 0.1–1.0)
Monocytes Relative: 4 %
Neutro Abs: 11.9 10*3/uL — ABNORMAL HIGH (ref 1.7–7.7)
Neutrophils Relative %: 91 %
Platelets: 314 10*3/uL (ref 150–400)
RBC: 3.45 MIL/uL — ABNORMAL LOW (ref 4.22–5.81)
RDW: 14.5 % (ref 11.5–15.5)
WBC: 13 10*3/uL — ABNORMAL HIGH (ref 4.0–10.5)
nRBC: 0 % (ref 0.0–0.2)

## 2023-02-21 LAB — CBC
HCT: 28.8 % — ABNORMAL LOW (ref 39.0–52.0)
Hemoglobin: 8.9 g/dL — ABNORMAL LOW (ref 13.0–17.0)
MCH: 28.3 pg (ref 26.0–34.0)
MCHC: 30.9 g/dL (ref 30.0–36.0)
MCV: 91.7 fL (ref 80.0–100.0)
Platelets: 293 10*3/uL (ref 150–400)
RBC: 3.14 MIL/uL — ABNORMAL LOW (ref 4.22–5.81)
RDW: 14.4 % (ref 11.5–15.5)
WBC: 18.2 10*3/uL — ABNORMAL HIGH (ref 4.0–10.5)
nRBC: 0 % (ref 0.0–0.2)

## 2023-02-21 LAB — RESP PANEL BY RT-PCR (RSV, FLU A&B, COVID)  RVPGX2
Influenza A by PCR: NEGATIVE
Influenza B by PCR: NEGATIVE
Resp Syncytial Virus by PCR: NEGATIVE
SARS Coronavirus 2 by RT PCR: NEGATIVE

## 2023-02-21 LAB — PROCALCITONIN: Procalcitonin: 2.71 ng/mL

## 2023-02-21 LAB — I-STAT CG4 LACTIC ACID, ED: Lactic Acid, Venous: 1.6 mmol/L (ref 0.5–1.9)

## 2023-02-21 LAB — APTT: aPTT: 34 s (ref 24–36)

## 2023-02-21 MED ORDER — SODIUM CHLORIDE 0.9 % IV SOLN
1.0000 g | Freq: Once | INTRAVENOUS | Status: AC
  Administered 2023-02-21: 1 g via INTRAVENOUS
  Filled 2023-02-21: qty 10

## 2023-02-21 MED ORDER — SODIUM CHLORIDE 0.9 % IV SOLN
500.0000 mg | INTRAVENOUS | Status: DC
  Administered 2023-02-21: 500 mg via INTRAVENOUS
  Filled 2023-02-21: qty 5

## 2023-02-21 MED ORDER — LOPERAMIDE HCL 2 MG PO CAPS
2.0000 mg | ORAL_CAPSULE | Freq: Four times a day (QID) | ORAL | Status: DC | PRN
  Administered 2023-02-21: 2 mg via ORAL
  Filled 2023-02-21: qty 1

## 2023-02-21 MED ORDER — SODIUM CHLORIDE 0.9 % IV SOLN
1.0000 g | INTRAVENOUS | Status: DC
  Administered 2023-02-22 – 2023-02-24 (×3): 1 g via INTRAVENOUS
  Filled 2023-02-21 (×3): qty 10

## 2023-02-21 MED ORDER — ROPINIROLE HCL 1 MG PO TABS
3.0000 mg | ORAL_TABLET | Freq: Once | ORAL | Status: AC
  Administered 2023-02-21: 3 mg via ORAL
  Filled 2023-02-21: qty 3

## 2023-02-21 MED ORDER — CLOPIDOGREL BISULFATE 75 MG PO TABS
75.0000 mg | ORAL_TABLET | Freq: Every day | ORAL | Status: DC
  Administered 2023-02-21 – 2023-02-24 (×4): 75 mg via ORAL
  Filled 2023-02-21 (×4): qty 1

## 2023-02-21 MED ORDER — SIMVASTATIN 20 MG PO TABS
40.0000 mg | ORAL_TABLET | Freq: Every day | ORAL | Status: DC
  Administered 2023-02-21 – 2023-02-23 (×3): 40 mg via ORAL
  Filled 2023-02-21 (×3): qty 2

## 2023-02-21 MED ORDER — ACETAMINOPHEN 500 MG PO TABS
1000.0000 mg | ORAL_TABLET | Freq: Every day | ORAL | Status: DC
  Administered 2023-02-21 – 2023-02-23 (×3): 1000 mg via ORAL
  Filled 2023-02-21 (×3): qty 2

## 2023-02-21 MED ORDER — ACETAMINOPHEN 325 MG PO TABS
650.0000 mg | ORAL_TABLET | Freq: Four times a day (QID) | ORAL | Status: DC | PRN
  Administered 2023-02-23: 650 mg via ORAL
  Filled 2023-02-21: qty 2

## 2023-02-21 MED ORDER — GABAPENTIN 100 MG PO CAPS
100.0000 mg | ORAL_CAPSULE | Freq: Every day | ORAL | Status: DC
  Administered 2023-02-21 – 2023-02-23 (×3): 100 mg via ORAL
  Filled 2023-02-21 (×3): qty 1

## 2023-02-21 MED ORDER — ACETAMINOPHEN 325 MG PO TABS
325.0000 mg | ORAL_TABLET | Freq: Once | ORAL | Status: AC
  Administered 2023-02-21: 325 mg via ORAL
  Filled 2023-02-21: qty 1

## 2023-02-21 MED ORDER — ONDANSETRON HCL 4 MG PO TABS: 4.0000 mg | ORAL_TABLET | Freq: Four times a day (QID) | ORAL | Status: DC | PRN

## 2023-02-21 MED ORDER — ENOXAPARIN SODIUM 40 MG/0.4ML IJ SOSY
40.0000 mg | PREFILLED_SYRINGE | INTRAMUSCULAR | Status: DC
  Administered 2023-02-23 – 2023-02-24 (×2): 40 mg via SUBCUTANEOUS
  Filled 2023-02-21 (×2): qty 0.4

## 2023-02-21 MED ORDER — EZETIMIBE 10 MG PO TABS
10.0000 mg | ORAL_TABLET | Freq: Every day | ORAL | Status: DC
  Administered 2023-02-21 – 2023-02-23 (×3): 10 mg via ORAL
  Filled 2023-02-21 (×3): qty 1

## 2023-02-21 MED ORDER — ACETAMINOPHEN 650 MG RE SUPP
650.0000 mg | Freq: Four times a day (QID) | RECTAL | Status: DC | PRN
Start: 2023-02-21 — End: 2023-02-24

## 2023-02-21 MED ORDER — SODIUM CHLORIDE 0.9 % IV BOLUS (SEPSIS)
1000.0000 mL | Freq: Once | INTRAVENOUS | Status: AC
Start: 2023-02-21 — End: 2023-02-21
  Administered 2023-02-21: 1000 mL via INTRAVENOUS

## 2023-02-21 MED ORDER — ASPIRIN 81 MG PO TBEC
81.0000 mg | DELAYED_RELEASE_TABLET | Freq: Every evening | ORAL | Status: DC
  Administered 2023-02-21 – 2023-02-23 (×3): 81 mg via ORAL
  Filled 2023-02-21 (×3): qty 1

## 2023-02-21 MED ORDER — EZETIMIBE-SIMVASTATIN 10-40 MG PO TABS
1.0000 | ORAL_TABLET | Freq: Every day | ORAL | Status: DC
  Filled 2023-02-21: qty 1

## 2023-02-21 MED ORDER — SODIUM CHLORIDE 0.9% FLUSH
10.0000 mL | Freq: Two times a day (BID) | INTRAVENOUS | Status: DC
  Administered 2023-02-21 – 2023-02-24 (×6): 10 mL via INTRAVENOUS

## 2023-02-21 MED ORDER — ONDANSETRON HCL 4 MG/2ML IJ SOLN: 4.0000 mg | Freq: Four times a day (QID) | INTRAMUSCULAR | Status: DC | PRN

## 2023-02-21 NOTE — H&P (Signed)
TRH H&P    Patient Demographics:    Erik [name], is a 82 y.o. male  MRN: [id number]  DOB - 1940-07-04  Admit Date - 02/21/2023  Referring MD/NP/PA: Rolla Flatten  Outpatient Primary MD for the patient is Excell Seltzer, MD  Patient coming from: Home  Chief complaint- Generalized weakness, fever   HPI:    Erik [name]  is a 82 y.o. male, with history of COPD, hypertension, PAD with bilateral CEAs, CAD status post PCI proximal LAD and proximal RCA, chronically on Plavix, chronic systolic heart failure with EF 45 to 50% as of 5/20, BPH, hyperlipidemia, hypertension, ischemic cardiomyopathy who came to hospital with complaints of generalized weakness, upper respiratory symptoms with runny nose for about a week.  As per patient's wife he was very weak yesterday was unable to get up from bed.  Also had fever at home. Denies chest pain or shortness of breath Denies nausea vomiting or diarrhea.  No abdominal pain Denies dysuria In the ED he was found to be hypotensive, febrile with Tmax 101.2 Chest x-ray showed left retrocardiac opacity consistent with pneumonia Started on ceftriaxone Zithromax Lactic acid 1.6    Review of systems:    In addition to the HPI above,    All other systems reviewed and are negative.    Past History of the following :    Past Medical History:  Diagnosis Date   Adenomatous colon polyp    Aortic aneurysm (HCC)    BPH (benign prostatic hypertrophy)    CAD (coronary artery disease)    Carotid artery stenosis    a. Bilateral CEA   Cataract    CHF (congestive heart failure) (HCC)    Chronic systolic heart failure (HCC)    a. EF 20-25%, mild LVH, mod HK, mid apicalanteroseptal myocardium, mild MR, LA mod dilated   Collagen vascular disease (HCC)    COPD (chronic obstructive pulmonary disease) (HCC)    Coronary artery disease    a. LHC (08/2013): Lmain: short 30% distal,  LAD: sml D1 & D2, 70% ostial D1, 95-99% LAD stenosis prox D2 LCx: sml/mod ramus subtot. occluded, 40% ostial set off lg OM1, 40% AV LCx after OM1, RCA: 90% prox (DES to RCA and prox LAD)   Diverticulosis    GERD (gastroesophageal reflux disease)    Heart murmur    History of colonic polyps    Hyperlipidemia    Hyperplastic colon polyp    Hypertension    Ischemic cardiomyopathy    RLS (restless legs syndrome)       Past Surgical History:  Procedure Laterality Date   BUBBLE STUDY  02/03/2022   Procedure: BUBBLE STUDY;  Surgeon: Meriam Sprague, MD;  Location[facility] ENDOSCOPY;  Service: Cardiovascular;;   cardiac stents  09-2013   CAROTID ENDARTERECTOMY  04/17/2008   right   CAROTID ENDARTERECTOMY  05/30/08   Left   CATARACT EXTRACTION W/PHACO Right 10/05/2018   Procedure: CATARACT EXTRACTION PHACO AND INTRAOCULAR LENS PLACEMENT (IOC), RIGHT;  Surgeon: Galen Manila, MD;  Location: [facility] ORS;  Service:  Ophthalmology;  Laterality: Right;  [place] 01:06.4 cde 12.79 Fluid Pack Lot # [id number]   CATARACT EXTRACTION W/PHACO Left 11/02/2018   Procedure: CATARACT EXTRACTION PHACO AND INTRAOCULAR LENS PLACEMENT (IOC), LEFT;  Surgeon: Galen Manila, MD;  Location: [facility] ORS;  Service: Ophthalmology;  Laterality: Left;  [place]  01:10 CDE 11.52 Fluid pack lot # [id number]   CHOLECYSTECTOMY     Gall Bladder   CORONARY ANGIOGRAM  09/11/2013   Procedure: CORONARY ANGIOGRAM;  Surgeon: Lennette Bihari, MD;  Location: [facility] CATH LAB;  Service: Cardiovascular;;   CORONARY ANGIOPLASTY     STENTS   HERNIA REPAIR     lower aorta aneurysm  05/26/2015   [facility]   PERCUTANEOUS STENT INTERVENTION  09/11/2013   Procedure: PERCUTANEOUS STENT INTERVENTION;  Surgeon: Lennette Bihari, MD;  Location: [facility] CATH LAB;  Service: Cardiovascular;;  DES Prox RCA 3.5x15 xience    TEE WITHOUT CARDIOVERSION N/A 02/03/2022   Procedure: TRANSESOPHAGEAL ECHOCARDIOGRAM (TEE);  Surgeon: Meriam Sprague, MD;  Location: [facility] ENDOSCOPY;   Service: Cardiovascular;  Laterality: N/A;      Social History:      Social History   Tobacco Use   Smoking status: Every Day    Current packs/day: 1.00    Average packs/day: 1 pack/day for 50.0 years (50.0 ttl pk-yrs)    Types: Cigarettes    Passive exposure: Never   Smokeless tobacco: Current   Tobacco comments:    off and on since age 32, currently smokes a pack a day [facility] 12/23/20  Substance Use Topics   Alcohol use: No       Family History :     Family History  Problem Relation Age of Onset   Heart disease Brother    Hyperlipidemia Brother    Pancreatic cancer Brother    Hypertension Mother    Cancer Father        unknown type; sounds GI   Emphysema Father    Hyperlipidemia Brother    Hyperlipidemia Brother    Heart attack Brother    Hyperlipidemia Brother    Multiple sclerosis Brother    Kidney disease Neg Hx    Prostate cancer Neg Hx    Colon cancer Neg Hx    Esophageal cancer Neg Hx    Rectal cancer Neg Hx    Stomach cancer Neg Hx      Home Medications:   Prior to Admission medications   Medication Sig Start Date End Date Taking? Authorizing Provider  acetaminophen (TYLENOL) 500 MG tablet Take 1,000 mg by mouth at bedtime.    [provider]  aspirin EC 81 MG tablet Take 81 mg by mouth every evening.     [provider]  azithromycin (ZITHROMAX) 250 MG tablet Take 2 tablets by mouth today, then 1 tablet daily for 4 additional days. 09/27/22   Doreene Nest, NP  clopidogrel (PLAVIX) 75 MG tablet TAKE 1 TABLET BY MOUTH EVERY DAY 11/03/22   Laurey Morale, MD  cyanocobalamin (VITAMIN B12) 1000 MCG tablet Take 1,000 mcg by mouth daily.    [provider]  ezetimibe-simvastatin (VYTORIN) 10-40 MG tablet TAKE 1 TABLET BY MOUTH EVERYDAY AT BEDTIME 10/06/22   Bedsole, Amy E, MD  famotidine (PEPCID) 40 MG tablet Take 40 mg by mouth 2 (two) times daily.    [provider]  FARXIGA 10 MG TABS tablet TAKE 1 TABLET BY MOUTH  DAILY BEFORE BREAKFAST. 11/29/22   Laurey Morale, MD  ferrous sulfate 325 (65  FE) MG tablet Take 325 mg by mouth daily with breakfast.    [provider]  gabapentin (NEURONTIN) 100 MG capsule Take 1 capsule (100 mg total) by mouth at bedtime. 08/26/22   Bedsole, Amy E, MD  methocarbamol (ROBAXIN) 500 MG tablet TAKE 1 TABLET (500 MG TOTAL) BY MOUTH EVERY DAY AT BEDTIME AS NEEDED FOR MUSCLE SPASM 02/07/23   Bedsole, Amy E, MD  metoprolol succinate (TOPROL-XL) 50 MG 24 hr tablet TAKE 1 TABLET BY MOUTH EVERY DAY WITH OR IMMEDIATELY FOLLOWING MEAL (NEEDS OFFICE VISIT) 10/04/22   Laurey Morale, MD  Omega-3 Fatty Acids (FISH OIL) 1000 MG CAPS Take 1 capsule by mouth daily.    [provider]  rOPINIRole (REQUIP) 3 MG tablet TAKE 1/2 TABLET BY MOUTH IN THE MORNING, 1/2 TABLET MID DAY & TAKE 1 TABLET AT NIGHT 02/08/23   Bedsole, Amy E, MD  spironolactone (ALDACTONE) 25 MG tablet TAKE 1/2 TABLET BY MOUTH DAILY 11/10/22   Laurey Morale, MD  tamsulosin (FLOMAX) 0.4 MG CAPS capsule TAKE 1 CAPSULE BY MOUTH EVERY DAY 09/08/22   Excell Seltzer, MD     Allergies:     Allergies  Allergen Reactions   Codeine Anaphylaxis and Swelling    throat swells     Physical Exam:   Vitals  Blood pressure (!) 99/53, pulse 79, temperature 98.7 F (37.1 C), temperature source Oral, resp. rate (!) 22, SpO2 100%.  1.  General: Appears in no acute distress  2. Psychiatric: Alert , Oriented x 3, intact insight and judgment  3. Neurologic: Cranial nerves II through XII grossly intact, no focal deficit noted  4. HEENMT:  Atraumatic normocephalic, extraocular's are intact  5. Respiratory : Decreased breath sounds in left lower lung field  6. Cardiovascular : S1-S2, regular, no murmur auscultated  7. Gastrointestinal:  Abdomen is soft, nontender, no organomegaly  No edema in the lower extremities   Data Review:    CBC Recent Labs  Lab 02/21/23 1508  WBC 13.0*  HGB 9.7*  HCT  31.4*  PLT 314  MCV 91.0  MCH 28.1  MCHC 30.9  RDW 14.5  LYMPHSABS 0.4*  MONOABS 0.6  EOSABS 0.0  BASOSABS 0.1   ------------------------------------------------------------------------------------------------------------------  Results for orders placed or performed during the hospital encounter of 02/21/23 (from the past 48 hour(s))  Comprehensive metabolic panel     Status: Abnormal   Collection Time: 02/21/23  3:08 PM  Result Value Ref Range   Sodium 135 135 - 145 mmol/L   Potassium 4.0 3.5 - 5.1 mmol/L   Chloride 103 98 - 111 mmol/L   CO2 18 (L) 22 - 32 mmol/L   Glucose, Bld 141 (H) 70 - 99 mg/dL    Comment: Glucose reference range applies only to samples taken after fasting for at least 8 hours.   BUN 20 8 - 23 mg/dL   Creatinine, Ser 5.28 0.61 - 1.24 mg/dL   Calcium 8.6 (L) 8.9 - 10.3 mg/dL   Total Protein 7.3 6.5 - 8.1 g/dL   Albumin 3.0 (L) 3.5 - 5.0 g/dL   AST 19 15 - 41 U/L   ALT 13 0 - 44 U/L   Alkaline Phosphatase 63 38 - 126 U/L   Total Bilirubin 0.4 <1.2 mg/dL   GFR, Estimated >41 >32 mL/min    Comment: (NOTE) Calculated using the CKD-EPI Creatinine Equation (2021)    Anion gap 14 5 - 15    Comment: Performed at [facility],  [address], [place], [place] [address]  CBC with Differential     Status: Abnormal   Collection Time: 02/21/23  3:08 PM  Result Value Ref Range   WBC 13.0 (H) 4.0 - 10.5 K/uL   RBC 3.45 (L) 4.22 - 5.81 MIL/uL   Hemoglobin 9.7 (L) 13.0 - 17.0 g/dL   HCT 60.4 (L) 54.0 - 98.1 %   MCV 91.0 80.0 - 100.0 fL   MCH 28.1 26.0 - 34.0 pg   MCHC 30.9 30.0 - 36.0 g/dL   RDW 19.1 47.8 - 29.5 %   Platelets 314 150 - 400 K/uL   nRBC 0.0 0.0 - 0.2 %   Neutrophils Relative % 91 %   Neutro Abs 11.9 (H) 1.7 - 7.7 K/uL   Lymphocytes Relative 3 %   Lymphs Abs 0.4 (L) 0.7 - 4.0 K/uL   Monocytes Relative 4 %   Monocytes Absolute 0.6 0.1 - 1.0 K/uL   Eosinophils Relative 0 %   Eosinophils Absolute 0.0 0.0 - 0.5 K/uL   Basophils Relative  1 %   Basophils Absolute 0.1 0.0 - 0.1 K/uL   Immature Granulocytes 1 %   Abs Immature Granulocytes 0.06 0.00 - 0.07 K/uL    Comment: Performed at [facility], [address], [place], [place] [address]  Protime-INR     Status: None   Collection Time: 02/21/23  3:08 PM  Result Value Ref Range   Prothrombin Time 14.7 11.4 - 15.2 seconds   INR 1.1 0.8 - 1.2    Comment: (NOTE) INR goal varies based on device and disease states. Performed at [facility], [address], [place], [place] [address]   APTT     Status: None   Collection Time: 02/21/23  3:08 PM  Result Value Ref Range   aPTT 34 24 - 36 seconds    Comment: Performed at [facility], [address] [place], [place] [address]  I-Stat Lactic Acid, ED     Status: None   Collection Time: 02/21/23  3:21 PM  Result Value Ref Range   Lactic Acid, Venous 1.6 0.5 - 1.9 mmol/L    Chemistries  Recent Labs  Lab 02/21/23 1508  NA 135  K 4.0  CL 103  CO2 18*  GLUCOSE 141*  BUN 20  CREATININE 1.15  CALCIUM 8.6*  AST 19  ALT 13  ALKPHOS 63  BILITOT 0.4   ------------------------------------------------------------------------------------------------------------------  ------------------------------------------------------------------------------------------------------------------ GFR: CrCl cannot be calculated (Unknown ideal weight.). Liver Function Tests: Recent Labs  Lab 02/21/23 1508  AST 19  ALT 13  ALKPHOS 63  BILITOT 0.4  PROT 7.3  ALBUMIN 3.0*   No results for input(s): "LIPASE", "AMYLASE" in the last 168 hours. No results for input(s): "AMMONIA" in the last 168 hours. Coagulation Profile: Recent Labs  Lab 02/21/23 1508  INR 1.1   Cardiac Enzymes: No results for input(s): "CKTOTAL", "CKMB", "CKMBINDEX", "TROPONINI" in the last 168 hours. BNP (last 3 results) No results for input(s): "PROBNP" in the last 8760 hours. HbA1C: No results for input(s): "HGBA1C" in the last 72  hours. CBG: No results for input(s): "GLUCAP" in the last 168 hours. Lipid Profile: No results for input(s): "CHOL", "HDL", "LDLCALC", "TRIG", "CHOLHDL", "LDLDIRECT" in the last 72 hours. Thyroid Function Tests: No results for input(s): "TSH", "T4TOTAL", "FREET4", "T3FREE", "THYROIDAB" in the last 72 hours. Anemia Panel: No results for input(s): "VITAMINB12", "FOLATE", "FERRITIN", "TIBC", "IRON", "RETICCTPCT" in the last 72 hours.  --------------------------------------------------------------------------------------------------------------- Urine  analysis:    Component Value Date/Time   COLORURINE YELLOW 03/09/2022 1750   APPEARANCEUR CLEAR 03/09/2022 1750   APPEARANCEUR Clear 09/10/2020 1110   LABSPEC >1.046 (H) 03/09/2022 1750   PHURINE 5.0 03/09/2022 1750   GLUCOSEU 150 (A) 03/09/2022 1750   HGBUR NEGATIVE 03/09/2022 1750   BILIRUBINUR NEGATIVE 03/09/2022 1750   BILIRUBINUR Negative 09/10/2020 1110   KETONESUR NEGATIVE 03/09/2022 1750   PROTEINUR NEGATIVE 03/09/2022 1750   UROBILINOGEN 0.2 05/27/2008 1117   NITRITE NEGATIVE 03/09/2022 1750   LEUKOCYTESUR NEGATIVE 03/09/2022 1750      Imaging Results:    DG Chest 1 View  Result Date: 02/21/2023 CLINICAL DATA:  Shortness of breath and cough and weakness. Began last night EXAM: CHEST  1 VIEW COMPARISON:  Chest x-ray 10/20/2022. FINDINGS: Hyperinflation. Chronic lung changes. Apical pleural thickening. No pneumothorax, effusion. More focal opacity seen in the left retrocardiac. Possible acute infiltrate. Normal cardiopericardial silhouette. Calcified aorta without edema. Aortic endograft seen at the level of the diaphragm. IMPRESSION: New left retrocardiac opacity. Possible acute infiltrate. Recommend follow-up. Hyperinflation with other chronic changes Electronically Signed   By: Karen Kays M.D.   On: 02/21/2023 16:28    My personal review of EKG: Rhythm NSR, no ST-T changes   Assessment & Plan:    Principal Problem:    CAP (community acquired pneumonia)   Community-acquired pneumonia-chest x-ray shows new left retrocardiac opacity, likely acute infiltrate.  Started on Rocephin and Zithromax, will continue with antibiotics.  Check procalcitonin.  Currently not requiring oxygen. SIRS due to pneumonia-presented with symptoms of tachypnea, hypotension, fever.  Started on antibiotics as above.  Blood pressure is soft, received 1 L of fluid bolus in the ED.  Will avoid further  boluses due to history of CHF.  Clinically stable at this time. CAD with PCI to proximal LAD and proximal RCA-continue aspirin, Plavix.  Hold metoprolol due to hypotension. Chronic systolic heart failure-last echo from May 2024 showed EF of 45 to 50%.  Currently euvolemic.  Will avoid further fluid boluses due to underlying CHF.  Hold Toprol XL and Aldactone due to hypotension. COPD-smokes 1-1/2 pack/day.  Currently stable.  Not requiring oxygen. History of bilateral CEAs-followed by vascular surgery.  Continue aspirin and Plavix. Peripheral arterial disease-history of aorto by iliac endograft with Dr. Pattricia Boss [facility].  Follows vascular surgery as outpatient. Hyperlipidemia-takes Vytorin at home.  Continue Vytorin. BPH-hold Flomax due to low blood pressure. Metabolic acidosis-likely in setting of hypotension, follow bicarb in AM.   DVT Prophylaxis-   Lovenox   AM Labs Ordered, also please review Full Orders  Family Communication: Admission, patients condition and plan of care including tests being ordered have been discussed with the patient who indicate understanding and agree with the plan and Code Status.  Code Status: Full code  Admission status: Inpatient :The appropriate admission status for this patient is INPATIENT. Inpatient status is judged to be reasonable and necessary in order to provide the required intensity of service to ensure the patient's safety. The patient's presenting symptoms, physical exam findings, and initial  radiographic and laboratory data in the context of their chronic comorbidities is felt to place them at high risk for further clinical deterioration. Furthermore, it is not anticipated that the patient will be medically stable for discharge from the hospital within 2 midnights of admission. The following factors support the admission status of inpatient.        Time spent in minutes : 60 minutes   Dijuan Sleeth S Murray Guzzetta M.D

## 2023-02-21 NOTE — ED Triage Notes (Signed)
Pt bib [facility] from home with reports SHOB. Cough, and weakness that started last night. Pt febrile on arrival.

## 2023-02-21 NOTE — ED Provider Notes (Signed)
[place] EMERGENCY DEPARTMENT AT [facility] Provider Note   CSN: [id number] Arrival date & time: 02/21/23  1446     History  Chief Complaint  Patient presents with   Shortness of Breath    Erik [name]. is a 82 y.o. male.  82 year old male with past medical history of tobacco use, CAD s/p stenting, HTN, HLD, T2DM, CHF presents for concerns of generalized weakness that began last night.  Symptoms are most noticeable when he is turning over in bed.  Patient denies headache, chest pain, shortness of breath, nausea, vomiting, abdominal pain.  He does endorse a cough, which is chronic for him and associated with his tobacco use.  Cough is productive of sputum, which is also chronic for him.  No changes in sputum production.  Patient denying dysuria or hematuria.  No known sick contacts.  Not having URI symptoms.  [place] history later obtained from the patient's wife, who states that he has been coughing more than normal.  He has been rundown for the last week or so.  Patient was unable to get out of bed today due to his weakness.  The history is provided by the patient and the spouse.       Home Medications Prior to Admission medications   Medication Sig Start Date End Date Taking? Authorizing Provider  acetaminophen (TYLENOL) 500 MG tablet Take 1,000 mg by mouth at bedtime.    [provider]  aspirin EC 81 MG tablet Take 81 mg by mouth every evening.     [provider]  azithromycin (ZITHROMAX) 250 MG tablet Take 2 tablets by mouth today, then 1 tablet daily for 4 additional days. 09/27/22   Doreene Nest, NP  clopidogrel (PLAVIX) 75 MG tablet TAKE 1 TABLET BY MOUTH EVERY DAY 11/03/22   Laurey Morale, MD  cyanocobalamin (VITAMIN B12) 1000 MCG tablet Take 1,000 mcg by mouth daily.    [provider]  ezetimibe-simvastatin (VYTORIN) 10-40 MG tablet TAKE 1 TABLET BY MOUTH EVERYDAY AT BEDTIME 10/06/22   Bedsole, Amy E, MD  famotidine  (PEPCID) 40 MG tablet Take 40 mg by mouth 2 (two) times daily.    [provider]  FARXIGA 10 MG TABS tablet TAKE 1 TABLET BY MOUTH DAILY BEFORE BREAKFAST. 11/29/22   Laurey Morale, MD  ferrous sulfate 325 (65 FE) MG tablet Take 325 mg by mouth daily with breakfast.    [provider]  gabapentin (NEURONTIN) 100 MG capsule Take 1 capsule (100 mg total) by mouth at bedtime. 08/26/22   Bedsole, Amy E, MD  methocarbamol (ROBAXIN) 500 MG tablet TAKE 1 TABLET (500 MG TOTAL) BY MOUTH EVERY DAY AT BEDTIME AS NEEDED FOR MUSCLE SPASM 02/07/23   Bedsole, Amy E, MD  metoprolol succinate (TOPROL-XL) 50 MG 24 hr tablet TAKE 1 TABLET BY MOUTH EVERY DAY WITH OR IMMEDIATELY FOLLOWING MEAL (NEEDS OFFICE VISIT) 10/04/22   Laurey Morale, MD  Omega-3 Fatty Acids (FISH OIL) 1000 MG CAPS Take 1 capsule by mouth daily.    [provider]  rOPINIRole (REQUIP) 3 MG tablet TAKE 1/2 TABLET BY MOUTH IN THE MORNING, 1/2 TABLET MID DAY & TAKE 1 TABLET AT NIGHT 02/08/23   Bedsole, Amy E, MD  spironolactone (ALDACTONE) 25 MG tablet TAKE 1/2 TABLET BY MOUTH DAILY 11/10/22   Laurey Morale, MD  tamsulosin (FLOMAX) 0.4 MG CAPS capsule TAKE 1 CAPSULE BY MOUTH EVERY DAY 09/08/22   Excell Seltzer, MD  Allergies    Codeine    Review of Systems   As noted in HPI  Physical Exam Updated Vital Signs BP (!) 115/46 (BP Location: Left Arm)   Pulse 95   Temp (!) 101.2 F (38.4 C) (Oral)   Resp (!) 22   SpO2 98%  Physical Exam Vitals reviewed.  Constitutional:      General: He is not in acute distress.    Appearance: Normal appearance. He is not ill-appearing, toxic-appearing or diaphoretic.  HENT:     Head: Normocephalic.     Nose: Nose normal.     Mouth/Throat:     Mouth: Mucous membranes are moist.  Eyes:     Conjunctiva/sclera: Conjunctivae normal.  Cardiovascular:     Rate and Rhythm: Normal rate and regular rhythm.     Pulses: Normal pulses.          Radial pulses are 2+ on the right  side and 2+ on the left side.       Dorsalis pedis pulses are 2+ on the right side and 2+ on the left side.     Heart sounds: Murmur (Mechanical sounding murmur in the left lower and right upper sternal borders) heard.     No friction rub. No gallop.  Pulmonary:     Effort: Pulmonary effort is normal.     Breath sounds: Examination of the left-lower field reveals rales. Rales present. No wheezing or rhonchi.  Chest:     Chest wall: No tenderness.  Abdominal:     General: There is no distension.     Palpations: Abdomen is soft.     Tenderness: There is no abdominal tenderness. There is no guarding or rebound.  Musculoskeletal:     Right lower leg: No edema.     Left lower leg: No edema.  Skin:    General: Skin is warm and dry.     Coloration: Skin is not jaundiced or pale.  Neurological:     Mental Status: He is alert.     ED Results / Procedures / Treatments   Labs (all labs ordered are listed, but only abnormal results are displayed) Labs Reviewed  RESP PANEL BY RT-PCR (RSV, FLU A&B, COVID)  RVPGX2  CULTURE, BLOOD (ROUTINE X 2)  CULTURE, BLOOD (ROUTINE X 2)  COMPREHENSIVE METABOLIC PANEL  CBC WITH DIFFERENTIAL/PLATELET  PROTIME-INR  APTT  URINALYSIS, W/ REFLEX TO CULTURE (INFECTION SUSPECTED)  I-STAT CG4 LACTIC ACID, ED    EKG None  Radiology No results found.  Procedures Procedures    Medications Ordered in ED Medications  sodium chloride flush (NS) 0.9 % injection 10 mL (10 mLs Intravenous Given 02/21/23 2134)  azithromycin (ZITHROMAX) 500 mg in sodium chloride 0.9 % 250 mL IVPB (0 mg Intravenous Stopped 02/21/23 1903)  acetaminophen (TYLENOL) tablet 1,000 mg (1,000 mg Oral Given 02/21/23 2132)  aspirin EC tablet 81 mg (81 mg Oral Given 02/21/23 1807)  clopidogrel (PLAVIX) tablet 75 mg (75 mg Oral Given 02/21/23 1807)  gabapentin (NEURONTIN) capsule 100 mg (100 mg Oral Given 02/21/23 2133)  enoxaparin (LOVENOX) injection 40 mg (has no administration in time  range)  acetaminophen (TYLENOL) tablet 650 mg (has no administration in time range)    Or  acetaminophen (TYLENOL) suppository 650 mg (has no administration in time range)  ondansetron (ZOFRAN) tablet 4 mg (has no administration in time range)    Or  ondansetron (ZOFRAN) injection 4 mg (has no administration in time range)  cefTRIAXone (ROCEPHIN) 1 g in sodium  chloride 0.9 % 100 mL IVPB (has no administration in time range)  ezetimibe (ZETIA) tablet 10 mg (10 mg Oral Given 02/21/23 1818)    And  simvastatin (ZOCOR) tablet 40 mg (40 mg Oral Given 02/21/23 1819)  loperamide (IMODIUM) capsule 2 mg (2 mg Oral Given 02/21/23 1905)  acetaminophen (TYLENOL) tablet 325 mg (325 mg Oral Given 02/21/23 1524)  cefTRIAXone (ROCEPHIN) 1 g in sodium chloride 0.9 % 100 mL IVPB (0 g Intravenous Stopped 02/21/23 1700)  sodium chloride 0.9 % bolus 1,000 mL (0 mLs Intravenous Stopped 02/21/23 1903)  rOPINIRole (REQUIP) tablet 3 mg (3 mg Oral Given 02/21/23 1759)    ED Course/ Medical Decision Making/ A&P Clinical Course as of 02/21/23 1708  Mon Feb 21, 2023  1530 DG Chest 1 View I independently interpreted this image.  No focal opacities.  No cardiomegaly or pleural effusions.  No mediastinal widening.  No evidence of pneumothorax. [JR]  1532 ED EKG 12-Lead I independently interpreted this ECG.  Sinus rhythm.  Rate of 97.  Prolonged QRS of 140 ms.  Prolonged QTc of 508 ms.  Left bundle branch block.  When compared to prior ECG, no significant changes noted.  Nonischemic ECG. [JR]  1549 Stable 82 YOM with a chief complaint of SOB HX CAD, tob use. Fever here  [CC]  1701 DG Chest 1 View New left retrocardiac opacity. Possible acute infiltrate. Recommend follow-up.   [JR]    Clinical Course User Index [CC] Glyn Ade, MD [JR] Rolla Flatten, MD                                 Medical Decision Making Amount and/or Complexity of Data Reviewed Independent Historian: spouse Radiology: ordered and  independent interpretation performed. Decision-making details documented in ED Course. ECG/medicine tests: ordered and independent interpretation performed. Decision-making details documented in ED Course.  Risk Decision regarding hospitalization.   82 year old male presents here for concerns of generalized weakness for the past 1 day.  Patient's wife also reporting that he is coughing more frequently than normal.  Arrives here febrile but otherwise hemodynamically stable.  On exam, he is nontoxic-appearing.  He does have focal rales in the left lower lobe on exam.  Differential includes pneumonia, URI, UTI, electrolyte abnormality, anemia.  I independently interpreted the patient's chest x-ray, which does have a focal retrocardiac opacity on the left.  Given focal findings on exam, this likely reflects an acute pneumonia.  I independently interpreted the patient's ECG, which is nonischemic.  Patient's laboratory workup notable for leukocytosis with left shift.  Hemoglobin is at his baseline.  Remainder of laboratory workup unrevealing.  Blood cultures are pending.  Patient given 1 L fluid bolus for potential sepsis.  However, he is not hypotensive.  In the setting of fluid shortage, do not feel that he requires 30 cc/kg bolus.  Will empirically treat with Ceftin and azithromycin for community-acquired pneumonia.  Patient would benefit from admission for IV antibiotics to ensure response to treatment.  Patient was discussed with the hospitalist, Dr. Sharl Ma, who has accepted the patient for admission.  Patient's presentation is most consistent with acute presentation with potential threat to life or bodily function.         Final Clinical Impression(s) / ED Diagnoses Final diagnoses:  Community acquired pneumonia of left lower lobe of lung    Rx / DC Orders ED Discharge Orders     None  Rolla Flatten, MD 02/21/23 5621    Glyn Ade, MD 02/21/23 2250

## 2023-02-21 NOTE — Sepsis Progress Note (Signed)
Elink monitoring for the code sepsis protocol.  

## 2023-02-22 DIAGNOSIS — R197 Diarrhea, unspecified: Secondary | ICD-10-CM

## 2023-02-22 DIAGNOSIS — J189 Pneumonia, unspecified organism: Secondary | ICD-10-CM | POA: Diagnosis not present

## 2023-02-22 DIAGNOSIS — D649 Anemia, unspecified: Secondary | ICD-10-CM

## 2023-02-22 LAB — CBC
HCT: 25 % — ABNORMAL LOW (ref 39.0–52.0)
Hemoglobin: 7.5 g/dL — ABNORMAL LOW (ref 13.0–17.0)
MCH: 27.7 pg (ref 26.0–34.0)
MCHC: 30 g/dL (ref 30.0–36.0)
MCV: 92.3 fL (ref 80.0–100.0)
Platelets: 238 10*3/uL (ref 150–400)
RBC: 2.71 MIL/uL — ABNORMAL LOW (ref 4.22–5.81)
RDW: 14.6 % (ref 11.5–15.5)
WBC: 14.6 10*3/uL — ABNORMAL HIGH (ref 4.0–10.5)
nRBC: 0 % (ref 0.0–0.2)

## 2023-02-22 LAB — COMPREHENSIVE METABOLIC PANEL
ALT: 9 U/L (ref 0–44)
AST: 14 U/L — ABNORMAL LOW (ref 15–41)
Albumin: 2.1 g/dL — ABNORMAL LOW (ref 3.5–5.0)
Alkaline Phosphatase: 44 U/L (ref 38–126)
Anion gap: 8 (ref 5–15)
BUN: 17 mg/dL (ref 8–23)
CO2: 18 mmol/L — ABNORMAL LOW (ref 22–32)
Calcium: 7.1 mg/dL — ABNORMAL LOW (ref 8.9–10.3)
Chloride: 112 mmol/L — ABNORMAL HIGH (ref 98–111)
Creatinine, Ser: 0.99 mg/dL (ref 0.61–1.24)
GFR, Estimated: >60 mL/min (ref >60)
Glucose, Bld: 90 mg/dL (ref 70–99)
Potassium: 3.6 mmol/L (ref 3.5–5.1)
Sodium: 138 mmol/L (ref 135–145)
Total Bilirubin: 0.2 mg/dL (ref <1.2)
Total Protein: 5.3 g/dL — ABNORMAL LOW (ref 6.5–8.1)

## 2023-02-22 MED ORDER — ROPINIROLE HCL 1 MG PO TABS
3.0000 mg | ORAL_TABLET | Freq: Every day | ORAL | Status: DC
  Administered 2023-02-22 – 2023-02-23 (×2): 3 mg via ORAL
  Filled 2023-02-22 (×2): qty 3

## 2023-02-22 MED ORDER — ROPINIROLE HCL 1 MG PO TABS
1.5000 mg | ORAL_TABLET | Freq: Two times a day (BID) | ORAL | Status: DC
  Administered 2023-02-22 – 2023-02-24 (×4): 1.5 mg via ORAL
  Filled 2023-02-22 (×4): qty 2

## 2023-02-22 MED ORDER — AZITHROMYCIN 500 MG PO TABS
500.0000 mg | ORAL_TABLET | Freq: Every day | ORAL | Status: DC
  Administered 2023-02-22 – 2023-02-24 (×3): 500 mg via ORAL
  Filled 2023-02-22 (×3): qty 1

## 2023-02-22 NOTE — ED Notes (Signed)
C/o restless legs, alert, NAD, calm, interactive, restless, mildly irritable, denies pain, requip given, sitting on side of bed, visitor at [facility].

## 2023-02-22 NOTE — ED Notes (Signed)
Pt eating, transport here, visitor at [facility].

## 2023-02-22 NOTE — Plan of Care (Signed)

## 2023-02-22 NOTE — Progress Notes (Addendum)
[facility]  PROGRESS NOTE  Erik [name]. ZHY:[id number] DOB: 1941/01/25 [name]: 02/21/2023 PCP: Excell Seltzer, MD   Brief HPI:    82 y.o. male, with history of COPD, hypertension, PAD with bilateral CEAs, CAD status post PCI proximal LAD and proximal RCA, chronically on Plavix, chronic systolic heart failure with EF 45 to 50% as of 5/20, BPH, hyperlipidemia, hypertension, ischemic cardiomyopathy who came to hospital with complaints of generalized weakness, upper respiratory symptoms with runny nose for about a week.  As per patient's wife he was very weak yesterday was unable to get up from bed.  Also had fever at home.     Assessment/Plan:   Community-acquired pneumonia-chest x-ray shows new left retrocardiac opacity, likely acute infiltrate.  Started on Rocephin and Zithromax, will continue with antibiotics.  Procalcitonin elevated at 2.71.   Currently not requiring oxygen.  SIRS due to pneumonia-presented with symptoms of tachypnea, hypotension, fever.  Started on antibiotics as above.  SIRS physiology has resolved.   Marland Kitchen CAD with PCI to proximal LAD and proximal RCA-continue aspirin, Plavix.  Hold metoprolol due to hypotension.  Chronic systolic heart failure-last echo from May 2024 showed EF of 45 to 50%.  Currently euvolemic.  Will avoid further fluid boluses due to underlying CHF.  Hold Toprol XL and Aldactone due to hypotension.  COPD-smokes 1-1/2 pack/day.  Currently stable.  Not requiring oxygen.  History of bilateral CEAs-followed by vascular surgery.  Continue aspirin and Plavix.  Peripheral arterial disease-history of aorto by iliac endograft with Dr. Pattricia Boss at [facility].  Follows vascular surgery as outpatient.  Hyperlipidemia-takes Vytorin at home.  Continue Vytorin.  BPH-hold Flomax due to low blood pressure.  Metabolic acidosis-hyperchloremic metabolic acidosis, follow bicarb level in a.m.  Normocytic anemia-hemoglobin down to 7.5 from 8.9 yesterday.  Could be  dilutional from IV fluids.  Will hold Lovenox and check CBC in AM.  IV fluids have been discontinued.  Diarrhea-resolved, likely antibiotic induced.  Continue Imodium as needed.  Denies abdominal pain.    Medications     acetaminophen  1,000 mg Oral QHS   aspirin EC  81 mg Oral QPM   clopidogrel  75 mg Oral Daily   enoxaparin (LOVENOX) injection  40 mg Subcutaneous Q24H   ezetimibe  10 mg Oral q1800   And   simvastatin  40 mg Oral q1800   gabapentin  100 mg Oral QHS   sodium chloride flush  10 mL Intravenous Q12H     Data Reviewed:   CBG:  No results for input(s): "GLUCAP" in the last 168 hours.  SpO2: 100 %    Vitals:   02/22/23 0030 02/22/23 0138 02/22/23 0418 02/22/23 0700  BP: (!) 92/51  (!) 126/51 (!) 113/52  Pulse: 66  71 72  Resp: 18  18 17   Temp:  98 F (36.7 C) (!) 97.5 F (36.4 C)   TempSrc:  Oral Oral   SpO2: 98%  99% 100%      Data Reviewed:  Basic Metabolic Panel: Recent Labs  Lab 02/21/23 1508 02/22/23 0505  NA 135 138  K 4.0 3.6  CL 103 112*  CO2 18* 18*  GLUCOSE 141* 90  BUN 20 17  CREATININE 1.15 0.99  CALCIUM 8.6* 7.1*    CBC: Recent Labs  Lab 02/21/23 1508 02/21/23 1848 02/22/23 0505  WBC 13.0* 18.2* 14.6*  NEUTROABS 11.9*  --   --   HGB 9.7* 8.9* 7.5*  HCT 31.4* 28.8* 25.0*  MCV 91.0 91.7 92.3  PLT 314 293 238    LFT Recent Labs  Lab 02/21/23 1508 02/22/23 0505  AST 19 14*  ALT 13 9  ALKPHOS 63 44  BILITOT 0.4 0.2  PROT 7.3 5.3*  ALBUMIN 3.0* 2.1*     Antibiotics: Anti-infectives (From admission, onward)    Start     Dose/Rate Route Frequency Ordered Stop   02/22/23 1000  cefTRIAXone (ROCEPHIN) 1 g in sodium chloride 0.9 % 100 mL IVPB        1 g 200 mL/hr over 30 Minutes Intravenous Every 24 hours 02/21/23 1753     02/21/23 1715  azithromycin (ZITHROMAX) 500 mg in sodium chloride 0.9 % 250 mL IVPB        500 mg 250 mL/hr over 60 Minutes Intravenous Every 24 hours 02/21/23 1702 02/26/23 1714   02/21/23  1600  cefTRIAXone (ROCEPHIN) 1 g in sodium chloride 0.9 % 100 mL IVPB        1 g 200 mL/hr over 30 Minutes Intravenous  Once 02/21/23 1554 02/21/23 1700        DVT prophylaxis: Lovenox  Code Status: Full code  Family Communication: No family at bedside   CONSULTS    Subjective   Developed diarrhea last night, currently resolved.  Denies abdominal pain.   Objective    Physical Examination:   General: Appears in no acute distress Cardiovascular: S1-S2, regular Respiratory: Mild crackles auscultated at left lung base Abdomen: Soft, nontender, no organomegaly Extremities: No edema in the lower extremities Neurologic: Alert, oriented x 3, no focal deficit noted   Status is: Inpatient:             Meredeth Ide   [facility] If 7PM-7AM, please contact night-coverage at [email or web address], Office  [phone]   02/22/2023, 7:46 AM  LOS: 1 day

## 2023-02-23 DIAGNOSIS — J189 Pneumonia, unspecified organism: Secondary | ICD-10-CM | POA: Diagnosis not present

## 2023-02-23 LAB — CBC
HCT: 27.2 % — ABNORMAL LOW (ref 39.0–52.0)
Hemoglobin: 8.2 g/dL — ABNORMAL LOW (ref 13.0–17.0)
MCH: 27.5 pg (ref 26.0–34.0)
MCHC: 30.1 g/dL (ref 30.0–36.0)
MCV: 91.3 fL (ref 80.0–100.0)
Platelets: 260 10*3/uL (ref 150–400)
RBC: 2.98 MIL/uL — ABNORMAL LOW (ref 4.22–5.81)
RDW: 14.3 % (ref 11.5–15.5)
WBC: 9.6 10*3/uL (ref 4.0–10.5)
nRBC: 0 % (ref 0.0–0.2)

## 2023-02-23 LAB — IRON AND TIBC
Iron: 12 ug/dL — ABNORMAL LOW (ref 45–182)
Saturation Ratios: 4 % — ABNORMAL LOW (ref 17.9–39.5)
TIBC: 286 ug/dL (ref 250–450)
UIBC: 274 ug/dL

## 2023-02-23 LAB — FOLATE: Folate: 14.6 ng/mL (ref >5.9)

## 2023-02-23 LAB — VITAMIN B12: Vitamin B-12: 1456 pg/mL — ABNORMAL HIGH (ref 180–914)

## 2023-02-23 MED ORDER — SODIUM CHLORIDE 0.9 % IV SOLN
100.0000 mg | Freq: Once | INTRAVENOUS | Status: AC
  Administered 2023-02-23: 100 mg via INTRAVENOUS
  Filled 2023-02-23: qty 5

## 2023-02-23 NOTE — Progress Notes (Signed)
  Progress Note   Patient: Erik [name]. PPI:[id number] DOB: 1940/10/26 [name]: 02/21/2023     2 DOS: the patient was seen and examined on 02/23/2023   Brief hospital course: 82 y.o. male, with history of COPD, hypertension, PAD with bilateral CEAs, CAD status post PCI proximal LAD and proximal RCA, chronically on Plavix, chronic systolic heart failure with EF 45 to 50% as of 5/20, BPH, hyperlipidemia, hypertension, ischemic cardiomyopathy who came to hospital with complaints of generalized weakness, upper respiratory symptoms with runny nose for about a week. As per patient's wife he was very weak yesterday was unable to get up from bed. Also had fever at home.   Assessment and Plan:  Community-acquired pneumonia with sepsis present on admit-chest x-ray shows new left retrocardiac opacity, likely acute infiltrate.  Started on Rocephin and Zithromax, will continue with antibiotics.  Procalcitonin elevated at 2.71.    WBC has improved. Pt reports feeling better Sepsis physiology resolved . CAD with PCI to proximal LAD and proximal RCA-continue aspirin, Plavix.   Metoprolol was on hold due to hypotension.   Chronic systolic heart failure-last echo from May 2024 showed EF of 45 to 50%.  Currently euvolemic.  Toprol XL and Aldactone were held due to hypotension.   COPD-smokes 1-1/2 pack/day.  Currently stable.  Not requiring oxygen.   History of bilateral CEAs-followed by vascular surgery.   Continue aspirin and Plavix. Checking occult stool per below   Peripheral arterial disease-history of aorto by iliac endograft with Dr. Pattricia Boss at [facility].  Follows vascular surgery as outpatient.   Hyperlipidemia-takes Vytorin at home.  Continue Vytorin.   BPH-hold Flomax due to low blood pressure.   Metabolic acidosis-hyperchloremic metabolic acidosis, follow bicarb level in a.m.   Iron deficiency anemia hemoglobin down to 7.5 recently Pt does report black stools, however is on PO iron PTA Occult  stool pending Ordered and reviewed iron level. Iron low at 12 with iron sat of 4.  Will order dose of IV iron Recheck CBC in AM Vit B12 and folate unremarkable   Diarrhea-resolved, likely antibiotic induced.  Continue Imodium as needed.  Denies abdominal pain.   Subjective: Reports feeling better today  Physical Exam: Vitals:   02/22/23 2110 02/23/23 0442 02/23/23 0739 02/23/23 1443  BP: 124/64 (!) 141/58 (!) 132/55 (!) 120/53  Pulse: 82 76 74 64  Resp: 18 18 19 17   Temp: 99.3 F (37.4 C) 98.7 F (37.1 C) 98.6 F (37 C) 98 F (36.7 C)  TempSrc: Oral Oral Oral Oral  SpO2: 97% 98% 96% 99%  Weight:      Height:       General exam: Awake, laying in bed, in nad Respiratory system: Normal respiratory effort, no wheezing Cardiovascular system: regular rate, s1, s2 Gastrointestinal system: Soft, nondistended, positive BS Central nervous system: CN2-12 grossly intact, strength intact Extremities: Perfused, no clubbing Skin: Normal skin turgor, no notable skin lesions seen Psychiatry: Mood normal // no visual hallucinations   Data Reviewed:  Labs reviewed: Iron 12, Saturation ratio 4, Vit B12 1456, Folate 14.6  Family Communication: Pt in room, family at bedside  Disposition: Status is: Inpatient Remains inpatient appropriate because: severity of illness  Planned Discharge Destination: Home    Author: Rickey Barbara, MD 02/23/2023 3:56 PM  For on call review [email or web address].

## 2023-02-23 NOTE — Hospital Course (Signed)
82 y.o. male, with history of COPD, hypertension, PAD with bilateral CEAs, CAD status post PCI proximal LAD and proximal RCA, chronically on Plavix, chronic systolic heart failure with EF 45 to 50% as of 5/20, BPH, hyperlipidemia, hypertension, ischemic cardiomyopathy who came to hospital with complaints of generalized weakness, upper respiratory symptoms with runny nose for about a week. As per patient's wife he was very weak yesterday was unable to get up from bed. Also had fever at home.

## 2023-02-23 NOTE — Plan of Care (Signed)

## 2023-02-24 ENCOUNTER — Other Ambulatory Visit (HOSPITAL_COMMUNITY): Payer: Self-pay

## 2023-02-24 DIAGNOSIS — J189 Pneumonia, unspecified organism: Secondary | ICD-10-CM | POA: Diagnosis not present

## 2023-02-24 LAB — COMPREHENSIVE METABOLIC PANEL
ALT: 13 U/L (ref 0–44)
AST: 17 U/L (ref 15–41)
Albumin: 2.6 g/dL — ABNORMAL LOW (ref 3.5–5.0)
Alkaline Phosphatase: 56 U/L (ref 38–126)
Anion gap: 7 (ref 5–15)
BUN: 13 mg/dL (ref 8–23)
CO2: 23 mmol/L (ref 22–32)
Calcium: 8.2 mg/dL — ABNORMAL LOW (ref 8.9–10.3)
Chloride: 103 mmol/L (ref 98–111)
Creatinine, Ser: 0.96 mg/dL (ref 0.61–1.24)
GFR, Estimated: >60 mL/min (ref >60)
Glucose, Bld: 105 mg/dL — ABNORMAL HIGH (ref 70–99)
Potassium: 4 mmol/L (ref 3.5–5.1)
Sodium: 133 mmol/L — ABNORMAL LOW (ref 135–145)
Total Bilirubin: 0.4 mg/dL (ref <1.2)
Total Protein: 6.7 g/dL (ref 6.5–8.1)

## 2023-02-24 LAB — CBC
HCT: 29.1 % — ABNORMAL LOW (ref 39.0–52.0)
Hemoglobin: 9.1 g/dL — ABNORMAL LOW (ref 13.0–17.0)
MCH: 28.3 pg (ref 26.0–34.0)
MCHC: 31.3 g/dL (ref 30.0–36.0)
MCV: 90.7 fL (ref 80.0–100.0)
Platelets: 272 10*3/uL (ref 150–400)
RBC: 3.21 MIL/uL — ABNORMAL LOW (ref 4.22–5.81)
RDW: 14.3 % (ref 11.5–15.5)
WBC: 8.6 10*3/uL (ref 4.0–10.5)
nRBC: 0 % (ref 0.0–0.2)

## 2023-02-24 MED ORDER — AZITHROMYCIN 500 MG PO TABS
500.0000 mg | ORAL_TABLET | Freq: Every day | ORAL | 0 refills | Status: DC
  Filled 2023-02-24: qty 3, 3d supply, fill #0

## 2023-02-24 MED ORDER — AZITHROMYCIN 500 MG PO TABS
500.0000 mg | ORAL_TABLET | Freq: Every day | ORAL | 0 refills | Status: AC
  Filled 2023-02-24: qty 1, 1d supply, fill #0

## 2023-02-24 MED ORDER — CEFDINIR 300 MG PO CAPS
300.0000 mg | ORAL_CAPSULE | Freq: Two times a day (BID) | ORAL | 0 refills | Status: AC
  Filled 2023-02-24: qty 6, 3d supply, fill #0

## 2023-02-24 NOTE — Care Management Important Message (Signed)
Important Message  Patient Details  Name: Erik [name]. MRN: [id number] Date of Birth: 1940-12-19   Important Message Given:  Yes - Medicare IM Patient left prior to Im delivery will mail a copy to the patient home .    Lannie Yusuf 02/24/2023, 1:20 PM

## 2023-02-24 NOTE — Plan of Care (Signed)
  Problem: Education: Goal: Knowledge of General Education information will improve Description: Including pain rating scale, medication(s)/side effects and non-pharmacologic comfort measures Outcome: Progressing   Problem: Clinical Measurements: Goal: Respiratory complications will improve Outcome: Progressing   Problem: Activity: Goal: Risk for activity intolerance will decrease Outcome: Progressing   

## 2023-02-24 NOTE — Discharge Summary (Signed)
Physician Discharge Summary   Patient: Erik [name]. MRN: [id number] DOB: Jun 10, 1940  Admit date:     02/21/2023  Discharge date: 02/24/23  Discharge Physician: Rickey Barbara   PCP: Excell Seltzer, MD   Recommendations at discharge:    Follow up with PCP in 1-2 weeks Follow up with Hematology as scheduled  Discharge Diagnoses: Principal Problem:   CAP (community acquired pneumonia)  Resolved Problems:   * No resolved hospital problems. *  Hospital Course: 82 y.o. male, with history of COPD, hypertension, PAD with bilateral CEAs, CAD status post PCI proximal LAD and proximal RCA, chronically on Plavix, chronic systolic heart failure with EF 45 to 50% as of 5/20, BPH, hyperlipidemia, hypertension, ischemic cardiomyopathy who came to hospital with complaints of generalized weakness, upper respiratory symptoms with runny nose for about a week. As per patient's wife he was very weak yesterday was unable to get up from bed. Also had fever at home.   Assessment and Plan: Community-acquired pneumonia with sepsis present on admit-chest x-ray shows new left retrocardiac opacity, likely acute infiltrate.  Started on Rocephin and Zithromax, will continue with antibiotics.  Procalcitonin elevated at 2.71.    WBC has normalized. Pt reports feeling better Sepsis physiology resolved Prescribed azithro and omnicef to complete 5 days of abx . CAD with PCI to proximal LAD and proximal RCA-continue aspirin, Plavix.   Metoprolol was on hold due to hypotension. BP had improved. Pt to resume home meds on d/c   Chronic systolic heart failure-last echo from May 2024 showed EF of 45 to 50%.  Currently euvolemic.  Toprol XL and Aldactone were held due to hypotension. BP meds resumed on d/c   COPD-smokes 1-1/2 pack/day.  Currently stable.  Not requiring oxygen.   History of bilateral CEAs-followed by vascular surgery.   Continue aspirin and Plavix.   Peripheral arterial disease-history of aorto  by iliac endograft with Dr. Pattricia Boss at [facility].  Follows vascular surgery as outpatient.   Hyperlipidemia-takes Vytorin at home.  Continue Vytorin.   BPH-hold Flomax due to low blood pressure.   Metabolic acidosis-hyperchloremic metabolic acidosis, follow bicarb level in a.m.   Iron deficiency anemia hemoglobin down to 7.5 recently Pt does report black stools, however is on PO iron PTA Ordered and reviewed iron level. Iron low at 12 with iron sat of 4.  Pt was given dose of IV iron Hgb had improved to over 9 Recommend continued close f/u with Dr. Arbutus Ped as outpatient   Diarrhea-resolved, likely antibiotic induced.  Continue Imodium as needed.  Denies abdominal pain.   Consultants:  Procedures performed:   Disposition: Home Diet recommendation:  Carb modified diet DISCHARGE MEDICATION: Allergies as of 02/24/2023       Reactions   Codeine Anaphylaxis, Swelling   throat swells        Medication List     TAKE these medications    acetaminophen 500 MG tablet Commonly known as: TYLENOL Take 1,000 mg by mouth at bedtime.   aspirin EC 81 MG tablet Take 81 mg by mouth every evening.   azithromycin 500 MG tablet Commonly known as: ZITHROMAX Take 1 tablet (500 mg total) by mouth daily for 1 day. Start taking on: February 25, 2023 What changed:  medication strength how much to take how to take this when to take this additional instructions   cefdinir 300 MG capsule Commonly known as: OMNICEF Take 1 capsule (300 mg total) by mouth 2 (two) times daily for 3 days. Start taking  on: February 25, 2023   clopidogrel 75 MG tablet Commonly known as: PLAVIX TAKE 1 TABLET BY MOUTH EVERY DAY   cyanocobalamin 1000 MCG tablet Commonly known as: VITAMIN B12 Take 1,000 mcg by mouth daily.   ezetimibe-simvastatin 10-40 MG tablet Commonly known as: VYTORIN TAKE 1 TABLET BY MOUTH EVERYDAY AT BEDTIME   famotidine 40 MG tablet Commonly known as: PEPCID Take 40 mg by mouth 2  (two) times daily.   Farxiga 10 MG Tabs tablet Generic drug: dapagliflozin propanediol TAKE 1 TABLET BY MOUTH DAILY BEFORE BREAKFAST.   ferrous sulfate 325 (65 FE) MG tablet Take 325 mg by mouth daily with breakfast.   Fish Oil 1000 MG Caps Take 1 capsule by mouth daily.   gabapentin 100 MG capsule Commonly known as: NEURONTIN Take 1 capsule (100 mg total) by mouth at bedtime.   methocarbamol 500 MG tablet Commonly known as: ROBAXIN TAKE 1 TABLET (500 MG TOTAL) BY MOUTH EVERY DAY AT BEDTIME AS NEEDED FOR MUSCLE SPASM   metoprolol succinate 50 MG 24 hr tablet Commonly known as: TOPROL-XL TAKE 1 TABLET BY MOUTH EVERY DAY WITH OR IMMEDIATELY FOLLOWING MEAL (NEEDS OFFICE VISIT)   mirtazapine 15 MG tablet Commonly known as: REMERON Take 15 mg by mouth at bedtime.   rOPINIRole 3 MG tablet Commonly known as: REQUIP TAKE 1/2 TABLET BY MOUTH IN THE MORNING, 1/2 TABLET MID DAY & TAKE 1 TABLET AT NIGHT   spironolactone 25 MG tablet Commonly known as: ALDACTONE TAKE 1/2 TABLET BY MOUTH DAILY   tamsulosin 0.4 MG Caps capsule Commonly known as: FLOMAX TAKE 1 CAPSULE BY MOUTH EVERY DAY        Follow-up Information     Bedsole, Amy E, MD Follow up in 2 week(s).   Specialty: Family Medicine Why: Hospital follow up Contact information: [address] [place] [address] [phone]         Si Gaul, MD Follow up.   Specialty: Oncology Why: Hospital follow up, as scheduled Contact information: [address] [place] [address] [phone]                Discharge Exam: Ceasar Mons Weights   02/22/23 1329  Weight: 50.7 kg   General exam: Awake, laying in bed, in nad Respiratory system: Normal respiratory effort, no wheezing Cardiovascular system: regular rate, s1, s2 Gastrointestinal system: Soft, nondistended, positive BS Central nervous system: CN2-12 grossly intact, strength intact Extremities: Perfused, no clubbing Skin:  Normal skin turgor, no notable skin lesions seen Psychiatry: Mood normal // no visual hallucinations    Condition at discharge: fair  The results of significant diagnostics from this hospitalization (including imaging, microbiology, ancillary and laboratory) are listed below for reference.   Imaging Studies: DG Chest 1 View Result Date: 02/21/2023 CLINICAL DATA:  Shortness of breath and cough and weakness. Began last night EXAM: CHEST  1 VIEW COMPARISON:  Chest x-ray 10/20/2022. FINDINGS: Hyperinflation. Chronic lung changes. Apical pleural thickening. No pneumothorax, effusion. More focal opacity seen in the left retrocardiac. Possible acute infiltrate. Normal cardiopericardial silhouette. Calcified aorta without edema. Aortic endograft seen at the level of the diaphragm. IMPRESSION: New left retrocardiac opacity. Possible acute infiltrate. Recommend follow-up. Hyperinflation with other chronic changes Electronically Signed   By: Karen Kays M.D.   On: 02/21/2023 16:28    Microbiology: Results for orders placed or performed during the hospital encounter of 02/21/23  Blood Culture (routine x 2)     Status: None (Preliminary result)   Collection  Time: 02/21/23  3:07 PM   Specimen: BLOOD LEFT ARM  Result Value Ref Range Status   Specimen Description BLOOD LEFT ARM  Final   Special Requests   Final    BOTTLES DRAWN AEROBIC AND ANAEROBIC Blood Culture adequate volume   Culture   Final    NO GROWTH 3 DAYS Performed at [facility], [address], [place], [place] [address]    Report Status PENDING  Incomplete  Blood Culture (routine x 2)     Status: None (Preliminary result)   Collection Time: 02/21/23  3:09 PM   Specimen: BLOOD RIGHT ARM  Result Value Ref Range Status   Specimen Description BLOOD RIGHT ARM  Final   Special Requests   Final    BOTTLES DRAWN AEROBIC AND ANAEROBIC Blood Culture results may not be optimal due to an inadequate volume of blood received in culture  bottles   Culture   Final    NO GROWTH 3 DAYS Performed at [facility], [address], [place], [place] [address]    Report Status PENDING  Incomplete  Resp panel by RT-PCR (RSV, Flu A&B, Covid) Anterior Nasal Swab     Status: None   Collection Time: 02/21/23  3:26 PM   Specimen: Anterior Nasal Swab  Result Value Ref Range Status   SARS Coronavirus 2 by RT PCR NEGATIVE NEGATIVE Final   Influenza A by PCR NEGATIVE NEGATIVE Final   Influenza B by PCR NEGATIVE NEGATIVE Final    Comment: (NOTE) The Xpert Xpress SARS-CoV-2/FLU/RSV plus assay is intended as an aid in the diagnosis of influenza from Nasopharyngeal swab specimens and should not be used as a sole basis for treatment. Nasal washings and aspirates are unacceptable for Xpert Xpress SARS-CoV-2/FLU/RSV testing.  Fact Sheet for Patients: BloggerCourse.com  Fact Sheet for Healthcare Providers: SeriousBroker.it  This test is not yet approved or cleared by the [place] FDA and has been authorized for detection and/or diagnosis of SARS-CoV-2 by FDA under an Emergency Use Authorization (EUA). This EUA will remain in effect (meaning this test can be used) for the duration of the COVID-19 declaration under Section 564(b)(1) of the Act, 21 U.S.C. section 360bbb-3(b)(1), unless the authorization is terminated or revoked.     Resp Syncytial Virus by PCR NEGATIVE NEGATIVE Final    Comment: (NOTE) Fact Sheet for Patients: BloggerCourse.com  Fact Sheet for Healthcare Providers: SeriousBroker.it  This test is not yet approved or cleared by the [place] FDA and has been authorized for detection and/or diagnosis of SARS-CoV-2 by FDA under an Emergency Use Authorization (EUA). This EUA will remain in effect (meaning this test can be used) for the duration of the COVID-19 declaration under Section 564(b)(1) of the Act, 21  U.S.C. section 360bbb-3(b)(1), unless the authorization is terminated or revoked.  Performed at [facility], [address] [place], [place] [address]     Labs: CBC: Recent Labs  Lab 02/21/23 1508 02/21/23 1848 02/22/23 0505 02/23/23 1609 02/24/23 0630  WBC 13.0* 18.2* 14.6* 9.6 8.6  NEUTROABS 11.9*  --   --   --   --   HGB 9.7* 8.9* 7.5* 8.2* 9.1*  HCT 31.4* 28.8* 25.0* 27.2* 29.1*  MCV 91.0 91.7 92.3 91.3 90.7  PLT 314 293 238 260 272   Basic Metabolic Panel: Recent Labs  Lab 02/21/23 1508 02/22/23 0505 02/24/23 0630  NA 135 138 133*  K 4.0 3.6 4.0  CL 103 112* 103  CO2 18*  18* 23  GLUCOSE 141* 90 105*  BUN 20 17 13   CREATININE 1.15 0.99 0.96  CALCIUM 8.6* 7.1* 8.2*   Liver Function Tests: Recent Labs  Lab 02/21/23 1508 02/22/23 0505 02/24/23 0630  AST 19 14* 17  ALT 13 9 13   ALKPHOS 63 44 56  BILITOT 0.4 0.2 0.4  PROT 7.3 5.3* 6.7  ALBUMIN 3.0* 2.1* 2.6*   CBG: No results for input(s): "GLUCAP" in the last 168 hours.  Discharge time spent: less than 30 minutes.  Signed: Rickey Barbara, MD [facility] 02/24/2023

## 2023-02-25 ENCOUNTER — Telehealth: Payer: Self-pay

## 2023-02-25 NOTE — Transitions of Care (Post Inpatient/ED Visit) (Signed)
02/25/2023  Name: Erik [name]. MRN: [id number] DOB: 11/08/1940  Today's TOC FU Call Status: Today's TOC FU Call Status:: Successful TOC FU Call Completed TOC FU Call Complete Date: 02/25/23 Patient's Name and Date of Birth confirmed.  Transition Care Management Follow-up Telephone Call Date of Discharge: 02/24/23 Discharge Facility: [facility]) Type of Discharge: Inpatient Admission Primary Inpatient Discharge Diagnosis:: Pneumonia How have you been since you were released from the hospital?: Better Any questions or concerns?: No  Items Reviewed: Did you receive and understand the discharge instructions provided?: Yes Medications obtained,verified, and reconciled?: Yes (Medications Reviewed) Any new allergies since your discharge?: No Dietary orders reviewed?: Yes Type of Diet Ordered:: Carb modified Do you have support at home?: Yes People in Home: spouse Name of Support/Comfort Primary Source: [name], Spouse  Medications Reviewed Today: Medications Reviewed Today     Reviewed by Redge Gainer, RN (Case Manager) on 02/25/23 at [phone]  Med List Status: <None>   Medication Order Taking? Sig Documenting Provider Last Dose Status Informant  acetaminophen (TYLENOL) 500 MG tablet [id number] No Take 1,000 mg by mouth at bedtime. [provider] 02/20/2023 Active Self, Spouse/Significant Other, Pharmacy Records           Med Note Tiburcio Pea, Jacqulyn Bath T   Tue Oct 03, 2018 10:05 AM)    aspirin EC 81 MG tablet [id number] No Take 81 mg by mouth every evening.  [provider] 02/19/2023 Active Self, Spouse/Significant Other, Pharmacy Records  azithromycin (ZITHROMAX) 500 MG tablet [id number]  Take 1 tablet (500 mg total) by mouth daily for 1 day. Jerald Kief, MD  Active   cefdinir (OMNICEF) 300 MG capsule [id number]  Take 1 capsule (300 mg total) by mouth 2 (two) times daily for 3 days. Jerald Kief, MD  Active   clopidogrel (PLAVIX) 75 MG tablet [id number]  No TAKE 1 TABLET BY MOUTH EVERY DAY Laurey Morale, MD 02/20/2023 Active Self, Spouse/Significant Other, Pharmacy Records  cyanocobalamin (VITAMIN B12) 1000 MCG tablet [id number] No Take 1,000 mcg by mouth daily. [provider] 02/20/2023 Active Self, Spouse/Significant Other, Pharmacy Records  ezetimibe-simvastatin (VYTORIN) 10-40 MG tablet [id number] No TAKE 1 TABLET BY MOUTH EVERYDAY AT BEDTIME Excell Seltzer, MD 02/19/2023 Active Self, Spouse/Significant Other, Pharmacy Records  famotidine (PEPCID) 40 MG tablet [id number] No Take 40 mg by mouth 2 (two) times daily. [provider] 02/20/2023 Active Self, Spouse/Significant Other, Pharmacy Records  FARXIGA 10 MG TABS tablet [id number] No TAKE 1 TABLET BY MOUTH DAILY BEFORE BREAKFAST. Laurey Morale, MD 02/20/2023 Active Self, Spouse/Significant Other, Pharmacy Records  ferrous sulfate 325 (65 FE) MG tablet [id number] No Take 325 mg by mouth daily with breakfast. [provider] 02/20/2023 Active Self, Spouse/Significant Other, Pharmacy Records  gabapentin (NEURONTIN) 100 MG capsule [id number] No Take 1 capsule (100 mg total) by mouth at bedtime. Excell Seltzer, MD 02/19/2023 Active Self, Spouse/Significant Other, Pharmacy Records  methocarbamol (ROBAXIN) 500 MG tablet [id number] No TAKE 1 TABLET (500 MG TOTAL) BY MOUTH EVERY DAY AT BEDTIME AS NEEDED FOR MUSCLE SPASM Ermalene Searing, Amy E, MD Past Week Active Self, Spouse/Significant Other, Pharmacy Records  metoprolol succinate (TOPROL-XL) 50 MG 24 hr tablet [id number] No TAKE 1 TABLET BY MOUTH EVERY DAY WITH OR IMMEDIATELY FOLLOWING MEAL (NEEDS OFFICE VISIT) Laurey Morale, MD 02/20/2023 0800 Active Self, Spouse/Significant Other, Pharmacy Records  mirtazapine (REMERON) 15 MG tablet [id number] No Take 15 mg by mouth at bedtime. [provider] 02/19/2023 Active Self, Spouse/Significant Other, Pharmacy  Records  Omega-3 Fatty Acids (FISH OIL) 1000 MG CAPS [id number] No Take 1 capsule by  mouth daily. [provider] 02/20/2023 Active Self, Spouse/Significant Other, Pharmacy Records  rOPINIRole (REQUIP) 3 MG tablet [id number] No TAKE 1/2 TABLET BY MOUTH IN THE MORNING, 1/2 TABLET MID DAY & TAKE 1 TABLET AT Geannie Risen, Amy E, MD 02/20/2023 Active Self, Spouse/Significant Other, Pharmacy Records  spironolactone (ALDACTONE) 25 MG tablet [id number] No TAKE 1/2 TABLET BY MOUTH DAILY Laurey Morale, MD 02/20/2023 Active Self, Spouse/Significant Other, Pharmacy Records  tamsulosin (FLOMAX) 0.4 MG CAPS capsule [id number] No TAKE 1 CAPSULE BY MOUTH EVERY DAY Bedsole, Amy E, MD 02/20/2023 Active Self, Spouse/Significant Other, Pharmacy Records            Home Care and Equipment/Supplies: Were Home Health Services Ordered?: NA Any new equipment or medical supplies ordered?: NA  Functional Questionnaire: Do you need assistance with bathing/showering or dressing?: No Do you need assistance with meal preparation?: No Do you need assistance with eating?: No Do you have difficulty maintaining continence: No Do you need assistance with getting out of bed/getting out of a chair/moving?: No Do you have difficulty managing or taking your medications?: No  Follow up appointments reviewed: PCP Follow-up appointment confirmed?: Yes Date of PCP follow-up appointment?: 03/01/23 Follow-up Provider: [facility] Follow-up appointment confirmed?: No Reason Specialist Follow-Up Not Confirmed: Patient has Specialist Provider Number and will Call for Appointment Do you need transportation to your follow-up appointment?: No Do you understand care options if your condition(s) worsen?: Yes-patient verbalized understanding  SDOH Interventions Today    Flowsheet Row Most Recent Value  SDOH Interventions   Food Insecurity Interventions Intervention Not Indicated  Housing Interventions Intervention Not Indicated  Transportation Interventions Intervention Not Indicated   Utilities Interventions Intervention Not Indicated      TOC Outreach today. The patient states he is doing much better. He has picked up his antibiotics and starts them today. He has no concerns. Verbal understanding of the discharge paperwork. PCP appointment scheduled today for Tuesday December 17th. The patient were call his Hematologist and schedule a follow up visit. The patient has been provided with contact information for the care management team and has been advised to call with any health-related questions or concerns. The patient verbalized understanding with current POC. The patient is directed to their insurance card regarding availability of benefits coverage.  Deidre Ala, RN Medical illustrator [facility] [phone]

## 2023-02-26 LAB — CULTURE, BLOOD (ROUTINE X 2)
Culture: NO GROWTH
Culture: NO GROWTH
Special Requests: ADEQUATE

## 2023-03-01 ENCOUNTER — Encounter: Payer: Self-pay | Admitting: Family Medicine

## 2023-03-01 ENCOUNTER — Ambulatory Visit (INDEPENDENT_AMBULATORY_CARE_PROVIDER_SITE_OTHER): Payer: Medicare Other | Admitting: Family Medicine

## 2023-03-01 VITALS — BP 128/60 | HR 61 | Temp 98.0°F | Ht 66.0 in | Wt 115.1 lb

## 2023-03-01 DIAGNOSIS — J189 Pneumonia, unspecified organism: Secondary | ICD-10-CM | POA: Diagnosis not present

## 2023-03-01 DIAGNOSIS — I5022 Chronic systolic (congestive) heart failure: Secondary | ICD-10-CM | POA: Diagnosis not present

## 2023-03-01 DIAGNOSIS — G2581 Restless legs syndrome: Secondary | ICD-10-CM | POA: Diagnosis not present

## 2023-03-01 DIAGNOSIS — D508 Other iron deficiency anemias: Secondary | ICD-10-CM

## 2023-03-01 NOTE — Assessment & Plan Note (Signed)
Acute, significant improvement in symptoms with antibiotics.  Will reevaluate chest x-ray for resolution in 2 weeks.

## 2023-03-01 NOTE — Progress Notes (Signed)
Patient ID: [name], male    DOB: 1941-02-26, 82 y.o.   MRN: [id number]  This visit was conducted in person.  BP 128/60 (BP Location: Right Arm, Patient Position: Sitting, Cuff Size: Normal)   Pulse 61   Temp 98 F (36.7 C) (Temporal)   Ht 5\' 6"  (1.676 m)   Wt 115 lb 2 oz (52.2 kg)   SpO2 99%   BMI 18.58 kg/m    CC:  Chief Complaint  Patient presents with   Hospitalization Follow-up    Subjective:   HPI: Matheson Gavette. is a 82 y.o. male presenting on 03/01/2023 for Hospitalization Follow-up  Reviewed hospital admission from December 9  Discharged February 24, 2023  Principal problem community-acquired pneumonia... Summary copied as follows Community-acquired pneumonia with sepsis present on admit-chest x-ray shows new left retrocardiac opacity, likely acute infiltrate.  Started on Rocephin and Zithromax, will continue with antibiotics.  Procalcitonin elevated at 2.71.    WBC has normalized. Pt reports feeling better Sepsis physiology resolved Prescribed azithro and omnicef to complete 5 days of abx . CAD with PCI to proximal LAD and proximal RCA-continue aspirin, Plavix.   Metoprolol was on hold due to hypotension. BP had improved. Pt to resume home meds on d/c   Chronic systolic heart failure-last echo from May 2024 showed EF of 45 to 50%.  Currently euvolemic.  Toprol XL and Aldactone were held due to hypotension. BP meds resumed on d/c  Iron deficiency anemia hemoglobin down to 7.5 recently Pt does report black stools, however is on PO iron PTA Ordered and reviewed iron level. Iron low at 12 with iron sat of 4.  Pt was given dose of IV iron Hgb had improved to over 9 at discharge Recommend continued close f/u with Dr. Arbutus Ped as outpatient   Today he reports  significant improved SOB and less cough. BAck at baseline. No fever recently.  Good po intake.  Weakness resolved.   On ferrous sulfate 325 mg daily, no SE. Has appt with Heme.. Dr.  Tanja Port in 05/2023.. will try to call and get appt sooner.  . Relevant past medical, surgical, family and social history reviewed and updated as indicated. Interim medical history since our last visit reviewed. Allergies and medications reviewed and updated. Outpatient Medications Prior to Visit  Medication Sig Dispense Refill   acetaminophen (TYLENOL) 500 MG tablet Take 1,000 mg by mouth at bedtime.     aspirin EC 81 MG tablet Take 81 mg by mouth every evening.      clopidogrel (PLAVIX) 75 MG tablet TAKE 1 TABLET BY MOUTH EVERY DAY 90 tablet 3   cyanocobalamin (VITAMIN B12) 1000 MCG tablet Take 1,000 mcg by mouth daily.     ezetimibe-simvastatin (VYTORIN) 10-40 MG tablet TAKE 1 TABLET BY MOUTH EVERYDAY AT BEDTIME 90 tablet 3   famotidine (PEPCID) 40 MG tablet Take 40 mg by mouth 2 (two) times daily.     FARXIGA 10 MG TABS tablet TAKE 1 TABLET BY MOUTH DAILY BEFORE BREAKFAST. 30 tablet 11   ferrous sulfate 325 (65 FE) MG tablet Take 325 mg by mouth daily with breakfast.     gabapentin (NEURONTIN) 100 MG capsule Take 1 capsule (100 mg total) by mouth at bedtime. 90 capsule 3   methocarbamol (ROBAXIN) 500 MG tablet TAKE 1 TABLET (500 MG TOTAL) BY MOUTH EVERY DAY AT BEDTIME AS NEEDED FOR MUSCLE SPASM 30 tablet 0   metoprolol succinate (TOPROL-XL) 50 MG 24 hr tablet  TAKE 1 TABLET BY MOUTH EVERY DAY WITH OR IMMEDIATELY FOLLOWING MEAL (NEEDS OFFICE VISIT) 180 tablet 0   mirtazapine (REMERON) 15 MG tablet Take 15 mg by mouth at bedtime.     Omega-3 Fatty Acids (FISH OIL) 1000 MG CAPS Take 1 capsule by mouth daily.     rOPINIRole (REQUIP) 3 MG tablet TAKE 1/2 TABLET BY MOUTH IN THE MORNING, 1/2 TABLET MID DAY & TAKE 1 TABLET AT NIGHT 180 tablet 1   spironolactone (ALDACTONE) 25 MG tablet TAKE 1/2 TABLET BY MOUTH DAILY 45 tablet 1   tamsulosin (FLOMAX) 0.4 MG CAPS capsule TAKE 1 CAPSULE BY MOUTH EVERY DAY 90 capsule 3   No facility-administered medications prior to visit.     Per HPI unless  specifically indicated in ROS section below Review of Systems  Constitutional:  Negative for fatigue and fever.  HENT:  Negative for ear pain.   Eyes:  Negative for pain.  Respiratory:  Negative for cough and shortness of breath.   Cardiovascular:  Negative for chest pain, palpitations and leg swelling.  Gastrointestinal:  Negative for abdominal pain.  Genitourinary:  Negative for dysuria.  Musculoskeletal:  Negative for arthralgias.  Neurological:  Negative for syncope, light-headedness and headaches.  Psychiatric/Behavioral:  Negative for dysphoric mood.    Objective:  BP 128/60 (BP Location: Right Arm, Patient Position: Sitting, Cuff Size: Normal)   Pulse 61   Temp 98 F (36.7 C) (Temporal)   Ht 5\' 6"  (1.676 m)   Wt 115 lb 2 oz (52.2 kg)   SpO2 99%   BMI 18.58 kg/m   Wt Readings from Last 3 Encounters:  03/01/23 115 lb 2 oz (52.2 kg)  02/22/23 111 lb 12.4 oz (50.7 kg)  12/30/22 108 lb (49 kg)      Physical Exam Vitals reviewed.  Constitutional:      Appearance: He is well-developed.  HENT:     Head: Normocephalic.     Right Ear: Hearing normal.     Left Ear: Hearing normal.     Nose: Nose normal.  Neck:     Thyroid: No thyroid mass or thyromegaly.     Vascular: No carotid bruit.     Trachea: Trachea normal.  Cardiovascular:     Rate and Rhythm: Normal rate and regular rhythm.     Pulses: Normal pulses.     Heart sounds: Heart sounds not distant. No murmur heard.    No friction rub. No gallop.     Comments: No peripheral edema Pulmonary:     Effort: Pulmonary effort is normal. No respiratory distress.     Breath sounds: Examination of the right-lower field reveals rales. Examination of the left-lower field reveals rales. Rales present.  Skin:    General: Skin is warm and dry.     Findings: No rash.  Psychiatric:        Speech: Speech normal.        Behavior: Behavior normal.        Thought Content: Thought content normal.       Results for orders placed  or performed during the hospital encounter of 02/21/23  Blood Culture (routine x 2)   Collection Time: 02/21/23  3:07 PM   Specimen: BLOOD LEFT ARM  Result Value Ref Range   Specimen Description BLOOD LEFT ARM    Special Requests      BOTTLES DRAWN AEROBIC AND ANAEROBIC Blood Culture adequate volume   Culture      NO GROWTH 5 DAYS Performed  at [facility], [address] [place], [place] [address]    Report Status 02/26/2023 FINAL   Comprehensive metabolic panel   Collection Time: 02/21/23  3:08 PM  Result Value Ref Range   Sodium 135 135 - 145 mmol/L   Potassium 4.0 3.5 - 5.1 mmol/L   Chloride 103 98 - 111 mmol/L   CO2 18 (L) 22 - 32 mmol/L   Glucose, Bld 141 (H) 70 - 99 mg/dL   BUN 20 8 - 23 mg/dL   Creatinine, Ser 8.46 0.61 - 1.24 mg/dL   Calcium 8.6 (L) 8.9 - 10.3 mg/dL   Total Protein 7.3 6.5 - 8.1 g/dL   Albumin 3.0 (L) 3.5 - 5.0 g/dL   AST 19 15 - 41 U/L   ALT 13 0 - 44 U/L   Alkaline Phosphatase 63 38 - 126 U/L   Total Bilirubin 0.4 <1.2 mg/dL   GFR, Estimated >96 >29 mL/min   Anion gap 14 5 - 15  CBC with Differential   Collection Time: 02/21/23  3:08 PM  Result Value Ref Range   WBC 13.0 (H) 4.0 - 10.5 K/uL   RBC 3.45 (L) 4.22 - 5.81 MIL/uL   Hemoglobin 9.7 (L) 13.0 - 17.0 g/dL   HCT 52.8 (L) 41.3 - 24.4 %   MCV 91.0 80.0 - 100.0 fL   MCH 28.1 26.0 - 34.0 pg   MCHC 30.9 30.0 - 36.0 g/dL   RDW 01.0 27.2 - 53.6 %   Platelets 314 150 - 400 K/uL   nRBC 0.0 0.0 - 0.2 %   Neutrophils Relative % 91 %   Neutro Abs 11.9 (H) 1.7 - 7.7 K/uL   Lymphocytes Relative 3 %   Lymphs Abs 0.4 (L) 0.7 - 4.0 K/uL   Monocytes Relative 4 %   Monocytes Absolute 0.6 0.1 - 1.0 K/uL   Eosinophils Relative 0 %   Eosinophils Absolute 0.0 0.0 - 0.5 K/uL   Basophils Relative 1 %   Basophils Absolute 0.1 0.0 - 0.1 K/uL   Immature Granulocytes 1 %   Abs Immature Granulocytes 0.06 0.00 - 0.07 K/uL  Protime-INR   Collection Time: 02/21/23  3:08 PM  Result Value Ref Range    Prothrombin Time 14.7 11.4 - 15.2 seconds   INR 1.1 0.8 - 1.2  APTT   Collection Time: 02/21/23  3:08 PM  Result Value Ref Range   aPTT 34 24 - 36 seconds  Blood Culture (routine x 2)   Collection Time: 02/21/23  3:09 PM   Specimen: BLOOD RIGHT ARM  Result Value Ref Range   Specimen Description BLOOD RIGHT ARM    Special Requests      BOTTLES DRAWN AEROBIC AND ANAEROBIC Blood Culture results may not be optimal due to an inadequate volume of blood received in culture bottles   Culture      NO GROWTH 5 DAYS Performed at [facility], [address], [place], [place] [address]    Report Status 02/26/2023 FINAL   I-Stat Lactic Acid, ED   Collection Time: 02/21/23  3:21 PM  Result Value Ref Range   Lactic Acid, Venous 1.6 0.5 - 1.9 mmol/L  Resp panel by RT-PCR (RSV, Flu A&B, Covid) Anterior Nasal Swab   Collection Time: 02/21/23  3:26 PM   Specimen: Anterior Nasal Swab  Result Value Ref Range   SARS Coronavirus 2 by RT PCR NEGATIVE NEGATIVE   Influenza A by PCR NEGATIVE NEGATIVE   Influenza B  by PCR NEGATIVE NEGATIVE   Resp Syncytial Virus by PCR NEGATIVE NEGATIVE  Urinalysis, w/ Reflex to Culture (Infection Suspected) -Urine, Clean Catch   Collection Time: 02/21/23  5:42 PM  Result Value Ref Range   Specimen Source URINE, CLEAN CATCH    Color, Urine YELLOW YELLOW   APPearance CLEAR CLEAR   Specific Gravity, Urine 1.021 1.005 - 1.030   pH 5.0 5.0 - 8.0   Glucose, UA >=500 (A) NEGATIVE mg/dL   Hgb urine dipstick NEGATIVE NEGATIVE   Bilirubin Urine NEGATIVE NEGATIVE   Ketones, ur NEGATIVE NEGATIVE mg/dL   Protein, ur NEGATIVE NEGATIVE mg/dL   Nitrite NEGATIVE NEGATIVE   Leukocytes,Ua NEGATIVE NEGATIVE   RBC / HPF 0-5 0 - 5 RBC/hpf   WBC, UA 0-5 0 - 5 WBC/hpf   Bacteria, UA NONE SEEN NONE SEEN   Squamous Epithelial / HPF 0-5 0 - 5 /HPF  Procalcitonin   Collection Time: 02/21/23  6:48 PM  Result Value Ref Range   Procalcitonin 2.71 ng/mL  CBC   Collection Time:  02/21/23  6:48 PM  Result Value Ref Range   WBC 18.2 (H) 4.0 - 10.5 K/uL   RBC 3.14 (L) 4.22 - 5.81 MIL/uL   Hemoglobin 8.9 (L) 13.0 - 17.0 g/dL   HCT 91.4 (L) 78.2 - 95.6 %   MCV 91.7 80.0 - 100.0 fL   MCH 28.3 26.0 - 34.0 pg   MCHC 30.9 30.0 - 36.0 g/dL   RDW 21.3 08.6 - 57.8 %   Platelets 293 150 - 400 K/uL   nRBC 0.0 0.0 - 0.2 %  CBC   Collection Time: 02/22/23  5:05 AM  Result Value Ref Range   WBC 14.6 (H) 4.0 - 10.5 K/uL   RBC 2.71 (L) 4.22 - 5.81 MIL/uL   Hemoglobin 7.5 (L) 13.0 - 17.0 g/dL   HCT 46.9 (L) 62.9 - 52.8 %   MCV 92.3 80.0 - 100.0 fL   MCH 27.7 26.0 - 34.0 pg   MCHC 30.0 30.0 - 36.0 g/dL   RDW 41.3 24.4 - 01.0 %   Platelets 238 150 - 400 K/uL   nRBC 0.0 0.0 - 0.2 %  Comprehensive metabolic panel   Collection Time: 02/22/23  5:05 AM  Result Value Ref Range   Sodium 138 135 - 145 mmol/L   Potassium 3.6 3.5 - 5.1 mmol/L   Chloride 112 (H) 98 - 111 mmol/L   CO2 18 (L) 22 - 32 mmol/L   Glucose, Bld 90 70 - 99 mg/dL   BUN 17 8 - 23 mg/dL   Creatinine, Ser 2.72 0.61 - 1.24 mg/dL   Calcium 7.1 (L) 8.9 - 10.3 mg/dL   Total Protein 5.3 (L) 6.5 - 8.1 g/dL   Albumin 2.1 (L) 3.5 - 5.0 g/dL   AST 14 (L) 15 - 41 U/L   ALT 9 0 - 44 U/L   Alkaline Phosphatase 44 38 - 126 U/L   Total Bilirubin 0.2 <1.2 mg/dL   GFR, Estimated >53 >66 mL/min   Anion gap 8 5 - 15  Iron and TIBC   Collection Time: 02/23/23 12:04 PM  Result Value Ref Range   Iron 12 (L) 45 - 182 ug/dL   TIBC 440 347 - 425 ug/dL   Saturation Ratios 4 (L) 17.9 - 39.5 %   UIBC 274 ug/dL  Folate   Collection Time: 02/23/23 12:04 PM  Result Value Ref Range   Folate 14.6 >5.9 ng/mL  Vitamin B12  Collection Time: 02/23/23 12:04 PM  Result Value Ref Range   Vitamin B-12 1,456 (H) 180 - 914 pg/mL  CBC   Collection Time: 02/23/23  4:09 PM  Result Value Ref Range   WBC 9.6 4.0 - 10.5 K/uL   RBC 2.98 (L) 4.22 - 5.81 MIL/uL   Hemoglobin 8.2 (L) 13.0 - 17.0 g/dL   HCT 08.6 (L) 57.8 - 46.9 %   MCV  91.3 80.0 - 100.0 fL   MCH 27.5 26.0 - 34.0 pg   MCHC 30.1 30.0 - 36.0 g/dL   RDW 62.9 52.8 - 41.3 %   Platelets 260 150 - 400 K/uL   nRBC 0.0 0.0 - 0.2 %  Comprehensive metabolic panel   Collection Time: 02/24/23  6:30 AM  Result Value Ref Range   Sodium 133 (L) 135 - 145 mmol/L   Potassium 4.0 3.5 - 5.1 mmol/L   Chloride 103 98 - 111 mmol/L   CO2 23 22 - 32 mmol/L   Glucose, Bld 105 (H) 70 - 99 mg/dL   BUN 13 8 - 23 mg/dL   Creatinine, Ser 2.44 0.61 - 1.24 mg/dL   Calcium 8.2 (L) 8.9 - 10.3 mg/dL   Total Protein 6.7 6.5 - 8.1 g/dL   Albumin 2.6 (L) 3.5 - 5.0 g/dL   AST 17 15 - 41 U/L   ALT 13 0 - 44 U/L   Alkaline Phosphatase 56 38 - 126 U/L   Total Bilirubin 0.4 <1.2 mg/dL   GFR, Estimated >01 >02 mL/min   Anion gap 7 5 - 15  CBC   Collection Time: 02/24/23  6:30 AM  Result Value Ref Range   WBC 8.6 4.0 - 10.5 K/uL   RBC 3.21 (L) 4.22 - 5.81 MIL/uL   Hemoglobin 9.1 (L) 13.0 - 17.0 g/dL   HCT 72.5 (L) 36.6 - 44.0 %   MCV 90.7 80.0 - 100.0 fL   MCH 28.3 26.0 - 34.0 pg   MCHC 31.3 30.0 - 36.0 g/dL   RDW 34.7 42.5 - 95.6 %   Platelets 272 150 - 400 K/uL   nRBC 0.0 0.0 - 0.2 %    Assessment and Plan  Community acquired pneumonia of left lower lobe of lung Assessment & Plan: Acute, significant improvement in symptoms with antibiotics.  Will reevaluate chest x-ray for resolution in 2 weeks.   Chronic systolic heart failure (HCC) Assessment & Plan: Chronic, euvolemic in office today Followed by cardiology.  Using Lasix 20 mg as needed   Iron deficiency anemia secondary to inadequate dietary iron intake Assessment & Plan: 8 acute, significant drop to 7.5 hemoglobin recently On oral iron and given dose of IV iron in hospital. Will have him return for iron panel and CBC in 2 weeks. He will keep follow-up as planned with Dr. Shirline Frees, hematology..  Will try to move appointment sooner   Restless leg syndrome Assessment & Plan: Chronic, poor control on  propranolol likely secondary to significant iron deficiency.  Discussed treating iron deficiency to help restless leg.  He has started low-dose gabapentin but has not noticed any benefit.  Will titrate this up as needed for pain     Return in about 2 weeks (around 03/15/2023) for  follow up  with cbc, iron panel and CXR.   [name], MD

## 2023-03-01 NOTE — Patient Instructions (Addendum)
Increased gabapentin at night to 200 mg for RLS... if still not better without side effects increase 100 mg in AM and 200 mg at night.  Continue  iron 325 mg daily.  Call to set up earlier appointment. with hematology if able.

## 2023-03-01 NOTE — Assessment & Plan Note (Signed)
8 acute, significant drop to 7.5 hemoglobin recently On oral iron and given dose of IV iron in hospital. Will have him return for iron panel and CBC in 2 weeks. He will keep follow-up as planned with Dr. Shirline Frees, hematology..  Will try to move appointment sooner

## 2023-03-01 NOTE — Assessment & Plan Note (Signed)
Chronic, poor control on propranolol likely secondary to significant iron deficiency.  Discussed treating iron deficiency to help restless leg.  He has started low-dose gabapentin but has not noticed any benefit.  Will titrate this up as needed for pain

## 2023-03-01 NOTE — Assessment & Plan Note (Signed)
Chronic, euvolemic in office today Followed by cardiology.  Using Lasix 20 mg as needed

## 2023-03-06 ENCOUNTER — Other Ambulatory Visit: Payer: Self-pay | Admitting: Family Medicine

## 2023-03-06 ENCOUNTER — Other Ambulatory Visit (HOSPITAL_COMMUNITY): Payer: Self-pay | Admitting: Cardiology

## 2023-03-07 NOTE — Telephone Encounter (Signed)
Last office visit 03/01/2023 for hosptial follow up.  Last refilled 02/07/2023 for #30 with no refills.  Next Appt: 03/17/23 for follow up.

## 2023-03-17 ENCOUNTER — Ambulatory Visit (INDEPENDENT_AMBULATORY_CARE_PROVIDER_SITE_OTHER)
Admission: RE | Admit: 2023-03-17 | Discharge: 2023-03-17 | Disposition: A | Discharge status: Home / Self Care | Payer: Medicare Other | Source: Ambulatory Visit | Attending: Family Medicine | Admitting: Family Medicine

## 2023-03-17 ENCOUNTER — Encounter: Payer: Self-pay | Admitting: Family Medicine

## 2023-03-17 ENCOUNTER — Ambulatory Visit: Payer: Medicare Other | Admitting: Family Medicine

## 2023-03-17 VITALS — BP 164/80 | HR 59 | Temp 97.1°F | Ht 66.0 in | Wt 118.0 lb

## 2023-03-17 DIAGNOSIS — J189 Pneumonia, unspecified organism: Secondary | ICD-10-CM

## 2023-03-17 DIAGNOSIS — R918 Other nonspecific abnormal finding of lung field: Secondary | ICD-10-CM | POA: Diagnosis not present

## 2023-03-17 DIAGNOSIS — D500 Iron deficiency anemia secondary to blood loss (chronic): Secondary | ICD-10-CM | POA: Diagnosis not present

## 2023-03-17 LAB — CBC WITH DIFFERENTIAL/PLATELET
Basophils Absolute: 0.1 10*3/uL (ref 0.0–0.1)
Basophils Relative: 1.3 % (ref 0.0–3.0)
Eosinophils Absolute: 0.3 10*3/uL (ref 0.0–0.7)
Eosinophils Relative: 4.1 % (ref 0.0–5.0)
HCT: 31.1 % — ABNORMAL LOW (ref 39.0–52.0)
Hemoglobin: 9.9 g/dL — ABNORMAL LOW (ref 13.0–17.0)
Lymphocytes Relative: 15.6 % (ref 12.0–46.0)
Lymphs Abs: 1.1 10*3/uL (ref 0.7–4.0)
MCHC: 31.9 g/dL (ref 30.0–36.0)
MCV: 90.1 fL (ref 78.0–100.0)
Monocytes Absolute: 0.8 10*3/uL (ref 0.1–1.0)
Monocytes Relative: 10.5 % (ref 3.0–12.0)
Neutro Abs: 5 10*3/uL (ref 1.4–7.7)
Neutrophils Relative %: 68.5 % (ref 43.0–77.0)
Platelets: 292 10*3/uL (ref 150.0–400.0)
RBC: 3.45 Mil/uL — ABNORMAL LOW (ref 4.22–5.81)
RDW: 16 % — ABNORMAL HIGH (ref 11.5–15.5)
WBC: 7.3 10*3/uL (ref 4.0–10.5)

## 2023-03-17 LAB — IBC + FERRITIN
Ferritin: 24.4 ng/mL (ref 22.0–322.0)
Iron: 36 ug/dL — ABNORMAL LOW (ref 42–165)
Saturation Ratios: 10.1 % — ABNORMAL LOW (ref 20.0–50.0)
TIBC: 355.6 ug/dL (ref 250.0–450.0)
Transferrin: 254 mg/dL (ref 212.0–360.0)

## 2023-03-17 NOTE — Assessment & Plan Note (Signed)
 Chronic, patient feels fatigue is gradually improved over time.  Will reevaluate iron panel and CBC today.  He has follow-up with hematology within this month.

## 2023-03-17 NOTE — Assessment & Plan Note (Signed)
 Acute, likely resolved.  Symptoms have resolved entirely.  Will reevaluate with chest x-ray to ensure resolution and no underlying pathology.

## 2023-03-17 NOTE — Progress Notes (Signed)
 Patient ID: Elon LITTIE Antonetta Mickey., male    DOB: 1941-02-16, 83 y.o.   MRN: [id number]  This visit was conducted in person.  BP (!) 164/80   Pulse (!) 59   Temp (!) 97.1 F (36.2 C) (Temporal)   Ht 5' 6 (1.676 m)   Wt 118 lb (53.5 kg)   SpO2 98%   BMI 19.05 kg/m    CC:  Chief Complaint  Patient presents with   Pneumonia    F/u from pneumonia    Subjective:   HPI: Abdulraheem Pineo. is a 83 y.o. male presenting on 03/17/2023 for Pneumonia (F/u from pneumonia)  Reviewed hospital admission from December 9  Discharged February 24, 2023  Principal problem community-acquired pneumonia... Summary copied as follows Community-acquired pneumonia with sepsis present on admit-chest x-ray shows new left retrocardiac opacity, likely acute infiltrate.  Started on Rocephin  and Zithromax , will continue with antibiotics.  Procalcitonin elevated at 2.71.    WBC has normalized. Pt reports feeling better Sepsis physiology resolved Prescribed azithro and omnicef  to complete 5 days of abx . CAD with PCI to proximal LAD and proximal RCA-continue aspirin , Plavix .   Metoprolol  was on hold due to hypotension. BP had improved. Pt to resume home meds on d/c   Chronic systolic heart failure-last echo from May 2024 showed EF of 45 to 50%.  Currently euvolemic.  Toprol  XL and Aldactone  were held due to hypotension. BP meds resumed on d/c  Iron  deficiency anemia hemoglobin down to 7.5 recently Pt does report black stools, however is on PO iron  PTA Ordered and reviewed iron  level. Iron  low at 12 with iron  sat of 4.  Pt was given dose of IV iron  Hgb had improved to over 9 at discharge Recommend continued close f/u with Dr. Sherrod as outpatient   Today.. he is due for CXR to eval for resolution of PNA.   At baseline.. no SOB, no fever.  Still some fatigue but at baseline energy.   Due for cbc today to check on hemoglobin... on ferrous sulfate  325 mg daily, no SE. Has appt with Heme.. Dr.  Virgene  as able to move earlier to 04/04/2022.  SABRA BP Readings from Last 3 Encounters:  03/17/23 (!) 164/80  03/01/23 128/60  02/24/23 (!) 151/59    Relevant past medical, surgical, family and social history reviewed and updated as indicated. Interim medical history since our last visit reviewed. Allergies and medications reviewed and updated. Outpatient Medications Prior to Visit  Medication Sig Dispense Refill   acetaminophen  (TYLENOL ) 500 MG tablet Take 1,000 mg by mouth at bedtime.     aspirin  EC 81 MG tablet Take 81 mg by mouth every evening.      clopidogrel  (PLAVIX ) 75 MG tablet TAKE 1 TABLET BY MOUTH EVERY DAY 90 tablet 3   cyanocobalamin  (VITAMIN B12) 1000 MCG tablet Take 1,000 mcg by mouth daily.     ezetimibe -simvastatin  (VYTORIN ) 10-40 MG tablet TAKE 1 TABLET BY MOUTH EVERYDAY AT BEDTIME 90 tablet 3   famotidine  (PEPCID ) 40 MG tablet Take 40 mg by mouth 2 (two) times daily.     FARXIGA  10 MG TABS tablet TAKE 1 TABLET BY MOUTH DAILY BEFORE BREAKFAST. 30 tablet 11   ferrous sulfate  325 (65 FE) MG tablet Take 325 mg by mouth daily with breakfast.     gabapentin  (NEURONTIN ) 100 MG capsule Take 1 capsule (100 mg total) by mouth at bedtime. 90 capsule 3   methocarbamol  (ROBAXIN ) 500 MG tablet TAKE  1 TABLET (500 MG TOTAL) BY MOUTH EVERY DAY AT BEDTIME AS NEEDED FOR MUSCLE SPASM 30 tablet 0   metoprolol  succinate (TOPROL -XL) 50 MG 24 hr tablet TAKE 1 TABLET BY MOUTH EVERY DAY WITH OR IMMEDIATELY FOLLOWING MEAL (NEEDS OFFICE VISIT) 180 tablet 0   mirtazapine  (REMERON ) 15 MG tablet TAKE 1 TABLET BY MOUTH EVERYDAY AT BEDTIME 90 tablet 1   Omega-3 Fatty Acids (FISH OIL) 1000 MG CAPS Take 1 capsule by mouth daily.     rOPINIRole  (REQUIP ) 3 MG tablet TAKE 1/2 TABLET BY MOUTH IN THE MORNING, 1/2 TABLET MID DAY & TAKE 1 TABLET AT NIGHT 180 tablet 1   spironolactone  (ALDACTONE ) 25 MG tablet TAKE 1/2 TABLET BY MOUTH DAILY 45 tablet 1   tamsulosin  (FLOMAX ) 0.4 MG CAPS capsule TAKE 1 CAPSULE BY  MOUTH EVERY DAY 90 capsule 3   No facility-administered medications prior to visit.     Per HPI unless specifically indicated in ROS section below Review of Systems  Constitutional:  Negative for fatigue and fever.  HENT:  Negative for ear pain.   Eyes:  Negative for pain.  Respiratory:  Negative for cough and shortness of breath.   Cardiovascular:  Negative for chest pain, palpitations and leg swelling.  Gastrointestinal:  Negative for abdominal pain.  Genitourinary:  Negative for dysuria.  Musculoskeletal:  Negative for arthralgias.  Neurological:  Negative for syncope, light-headedness and headaches.  Psychiatric/Behavioral:  Negative for dysphoric mood.    Objective:  BP (!) 164/80   Pulse (!) 59   Temp (!) 97.1 F (36.2 C) (Temporal)   Ht 5' 6 (1.676 m)   Wt 118 lb (53.5 kg)   SpO2 98%   BMI 19.05 kg/m   Wt Readings from Last 3 Encounters:  03/17/23 118 lb (53.5 kg)  03/01/23 115 lb 2 oz (52.2 kg)  02/22/23 111 lb 12.4 oz (50.7 kg)      Physical Exam Vitals reviewed.  Constitutional:      Appearance: He is well-developed.  HENT:     Head: Normocephalic.     Right Ear: Hearing normal.     Left Ear: Hearing normal.     Nose: Nose normal.  Neck:     Thyroid : No thyroid  mass or thyromegaly.     Vascular: No carotid bruit.     Trachea: Trachea normal.  Cardiovascular:     Rate and Rhythm: Normal rate and regular rhythm.     Pulses: Normal pulses.     Heart sounds: Heart sounds not distant. No murmur heard.    No friction rub. No gallop.     Comments: No peripheral edema Pulmonary:     Effort: Pulmonary effort is normal. No respiratory distress.     Breath sounds: Examination of the right-lower field reveals rales. Examination of the left-lower field reveals rales. Rales present.  Skin:    General: Skin is warm and dry.     Findings: No rash.  Psychiatric:        Speech: Speech normal.        Behavior: Behavior normal.        Thought Content: Thought  content normal.       Results for orders placed or performed in visit on 03/17/23  CBC with Differential/Platelet   Collection Time: 03/17/23 10:42 AM  Result Value Ref Range   WBC 7.3 4.0 - 10.5 K/uL   RBC 3.45 (L) 4.22 - 5.81 Mil/uL   Hemoglobin 9.9 (L) 13.0 - 17.0 g/dL  HCT 31.1 (L) 39.0 - 52.0 %   MCV 90.1 78.0 - 100.0 fl   MCHC 31.9 30.0 - 36.0 g/dL   RDW 83.9 (H) 88.4 - 84.4 %   Platelets 292.0 150.0 - 400.0 K/uL   Neutrophils Relative % 68.5 43.0 - 77.0 %   Lymphocytes Relative 15.6 12.0 - 46.0 %   Monocytes Relative 10.5 3.0 - 12.0 %   Eosinophils Relative 4.1 0.0 - 5.0 %   Basophils Relative 1.3 0.0 - 3.0 %   Neutro Abs 5.0 1.4 - 7.7 K/uL   Lymphs Abs 1.1 0.7 - 4.0 K/uL   Monocytes Absolute 0.8 0.1 - 1.0 K/uL   Eosinophils Absolute 0.3 0.0 - 0.7 K/uL   Basophils Absolute 0.1 0.0 - 0.1 K/uL  IBC + Ferritin   Collection Time: 03/17/23 10:42 AM  Result Value Ref Range   Iron  36 (L) 42 - 165 ug/dL   Transferrin 745.9 787.9 - 360.0 mg/dL   Saturation Ratios 89.8 (L) 20.0 - 50.0 %   Ferritin 24.4 22.0 - 322.0 ng/mL   TIBC 355.6 250.0 - 450.0 mcg/dL    Assessment and Plan  Community acquired pneumonia of left lower lobe of lung Assessment & Plan: Acute, likely resolved.  Symptoms have resolved entirely.  Will reevaluate with chest x-ray to ensure resolution and no underlying pathology.  Orders: -     DG Chest 2 View; Future  Iron  deficiency anemia due to chronic blood loss Assessment & Plan: Chronic, patient feels fatigue is gradually improved over time.  Will reevaluate iron  panel and CBC today.  He has follow-up with hematology within this month.  Orders: -     CBC with Differential/Platelet -     IBC + Ferritin     No follow-ups on file.   Greig Ring, MD

## 2023-03-17 NOTE — Patient Instructions (Addendum)
 Follow BP at home.. call if > 140/90.  Please stop at the lab to have labs drawn.

## 2023-03-27 ENCOUNTER — Other Ambulatory Visit: Payer: Self-pay | Admitting: Family Medicine

## 2023-03-27 ENCOUNTER — Other Ambulatory Visit (HOSPITAL_COMMUNITY): Payer: Self-pay | Admitting: Cardiology

## 2023-03-27 DIAGNOSIS — G2581 Restless legs syndrome: Secondary | ICD-10-CM

## 2023-03-27 NOTE — Telephone Encounter (Signed)
 Note from Pharmacy:  :PATIENT STATES THAT THEY NOW TAKE 3 DAILY. Okay to change dosing instructions and quantity?

## 2023-03-27 NOTE — Telephone Encounter (Signed)
 Last office visit 03/17/2023 for CAP.  Last refilled 03/07/23  for #30 with no refills.  Next Appt: No future appointments with PCP.

## 2023-03-29 MED ORDER — GABAPENTIN 100 MG PO CAPS: 100.0000 mg | ORAL_CAPSULE | Freq: Three times a day (TID) | ORAL | 0 refills | Status: DC

## 2023-03-29 NOTE — Addendum Note (Signed)
 Addended by: Damita Lack on: 03/29/2023 02:02 PM   Modules accepted: Orders

## 2023-04-04 ENCOUNTER — Other Ambulatory Visit: Payer: Self-pay | Admitting: Medical Oncology

## 2023-04-04 ENCOUNTER — Telehealth: Payer: Self-pay | Admitting: Medical Oncology

## 2023-04-04 DIAGNOSIS — D500 Iron deficiency anemia secondary to blood loss (chronic): Secondary | ICD-10-CM

## 2023-04-04 NOTE — Telephone Encounter (Addendum)
12/09-admitted for sepsis. Received iron infusion too.  12/12 discharged with HGB 9.9. iron=36, sat 10 and ferritin=24  Tomorrow -"Hospital f/u -"  Today , I spoke to wife . 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   Per [facility] I cancelled his appt for tomorrow .  LOS from sept with f/u in March sent to [facility]  I told wife to call if Erik [name] starts to feel  tired and weak.

## 2023-04-05 ENCOUNTER — Inpatient Hospital Stay: Payer: Medicare Other

## 2023-04-05 ENCOUNTER — Inpatient Hospital Stay: Payer: Medicare Other | Admitting: Internal Medicine

## 2023-05-03 ENCOUNTER — Encounter: Payer: Self-pay | Admitting: Pulmonary Disease

## 2023-05-16 ENCOUNTER — Other Ambulatory Visit: Payer: Self-pay | Admitting: Family Medicine

## 2023-05-16 NOTE — Telephone Encounter (Signed)
 Last office visit 03/17/23 for Pneumonia.  Last refilled 03/29/23 for #30 with no refills.  Next Appt: No future appointments with PCP.

## 2023-05-18 ENCOUNTER — Other Ambulatory Visit: Payer: Self-pay | Admitting: Family Medicine

## 2023-05-18 NOTE — Telephone Encounter (Signed)
 Copied from CRM [id number]. Topic: Clinical - Medication Refill >> May 18, 2023 11:08 AM Drema Balzarine wrote: Most Recent Primary Care Visit:  Provider: [name] E  Department: [facility]  Visit Type: OFFICE VISIT  Date: 03/17/2023  Medication: methocarbamol   Has the patient contacted their pharmacy? Yes, pharmacy says they reach out to office   (Agent: If no, request that the patient contact the pharmacy for the refill. If patient does not wish to contact the pharmacy document the reason why and proceed with request.) (Agent: If yes, when and what did the pharmacy advise?)  Is this the correct pharmacy for this prescription? Yes If no, delete pharmacy and type the correct one.  This is the patient's preferred pharmacy:   [phone] [facility], [place] - [address] [place] [address] Phone: [phone] Fax: [phone]   Has the prescription been filled recently? Yes  Is the patient out of the medication? Yes, for the past two days  Has the patient been seen for an appointment in the last year OR does the patient have an upcoming appointment? Yes  Can we respond through MyChart? Yes  Agent: Please be advised that Rx refills may take up to 3 business days. We ask that you follow-up with your pharmacy.

## 2023-05-25 ENCOUNTER — Other Ambulatory Visit: Payer: Self-pay

## 2023-05-25 ENCOUNTER — Inpatient Hospital Stay: Payer: Medicare Other | Attending: Internal Medicine

## 2023-05-25 ENCOUNTER — Other Ambulatory Visit: Payer: Medicare Other

## 2023-05-25 ENCOUNTER — Ambulatory Visit: Payer: Medicare Other | Admitting: Internal Medicine

## 2023-05-25 ENCOUNTER — Inpatient Hospital Stay (HOSPITAL_BASED_OUTPATIENT_CLINIC_OR_DEPARTMENT_OTHER): Payer: Medicare Other | Admitting: Internal Medicine

## 2023-05-25 VITALS — BP 160/63 | HR 60 | Temp 98.4°F | Resp 16 | Ht 66.0 in | Wt 116.0 lb

## 2023-05-25 DIAGNOSIS — D500 Iron deficiency anemia secondary to blood loss (chronic): Secondary | ICD-10-CM

## 2023-05-25 DIAGNOSIS — E611 Iron deficiency: Secondary | ICD-10-CM | POA: Diagnosis not present

## 2023-05-25 DIAGNOSIS — Z79899 Other long term (current) drug therapy: Secondary | ICD-10-CM | POA: Insufficient documentation

## 2023-05-25 DIAGNOSIS — G2581 Restless legs syndrome: Secondary | ICD-10-CM | POA: Insufficient documentation

## 2023-05-25 DIAGNOSIS — I714 Abdominal aortic aneurysm, without rupture, unspecified: Secondary | ICD-10-CM

## 2023-05-25 DIAGNOSIS — K922 Gastrointestinal hemorrhage, unspecified: Secondary | ICD-10-CM | POA: Diagnosis not present

## 2023-05-25 DIAGNOSIS — D638 Anemia in other chronic diseases classified elsewhere: Secondary | ICD-10-CM | POA: Diagnosis not present

## 2023-05-25 DIAGNOSIS — I6523 Occlusion and stenosis of bilateral carotid arteries: Secondary | ICD-10-CM

## 2023-05-25 LAB — CBC WITH DIFFERENTIAL (CANCER CENTER ONLY)
Abs Immature Granulocytes: 0.03 10*3/uL (ref 0.00–0.07)
Basophils Absolute: 0.1 10*3/uL (ref 0.0–0.1)
Basophils Relative: 1 %
Eosinophils Absolute: 0.3 10*3/uL (ref 0.0–0.5)
Eosinophils Relative: 4 %
HCT: 34.8 % — ABNORMAL LOW (ref 39.0–52.0)
Hemoglobin: 10.9 g/dL — ABNORMAL LOW (ref 13.0–17.0)
Immature Granulocytes: 0 %
Lymphocytes Relative: 14 %
Lymphs Abs: 1.1 10*3/uL (ref 0.7–4.0)
MCH: 27.2 pg (ref 26.0–34.0)
MCHC: 31.3 g/dL (ref 30.0–36.0)
MCV: 86.8 fL (ref 80.0–100.0)
Monocytes Absolute: 0.8 10*3/uL (ref 0.1–1.0)
Monocytes Relative: 10 %
Neutro Abs: 5.3 10*3/uL (ref 1.7–7.7)
Neutrophils Relative %: 71 %
Platelet Count: 303 10*3/uL (ref 150–400)
RBC: 4.01 MIL/uL — ABNORMAL LOW (ref 4.22–5.81)
RDW: 15.7 % — ABNORMAL HIGH (ref 11.5–15.5)
WBC Count: 7.5 10*3/uL (ref 4.0–10.5)
nRBC: 0 % (ref 0.0–0.2)

## 2023-05-25 LAB — IRON AND IRON BINDING CAPACITY (CC-WL,HP ONLY)
Iron: 43 ug/dL — ABNORMAL LOW (ref 45–182)
Saturation Ratios: 11 % — ABNORMAL LOW (ref 17.9–39.5)
TIBC: 381 ug/dL (ref 250–450)
UIBC: 338 ug/dL (ref 117–376)

## 2023-05-25 LAB — FERRITIN: Ferritin: 31 ng/mL (ref 24–336)

## 2023-05-25 NOTE — Progress Notes (Signed)
 [facility] [phone]   [phone]  OFFICE PROGRESS NOTE  Excell Seltzer, MD [address] [place] [address]  DIAGNOSIS: Anemia of chronic disease +/- iron deficiency secondary to gastrointestinal blood loss.  He has no improvement on the oral iron supplement.  PRIOR THERAPY: Iron infusion with Venofer 300 Mg IV weekly for 3 weeks.  Last dose was giving July 02, 2022  CURRENT THERAPY: Over-the-counter ferrous sulfate 325 mg p.o. daily with orange juice  INTERVAL HISTORY: Erik [name]. 83 y.o. male returns to the clinic today for 49-month follow-up visit.Discussed the use of AI scribe software for clinical note transcription with the patient, who gave verbal consent to proceed.  History of Present Illness   The patient is an 83 year old with anemia of chronic disease and iron deficiency who presents for follow-up.  He has a history of anemia of chronic disease and iron deficiency, attributed to gastrointestinal blood loss. He has been treated with iron infusions using Venofer and is currently taking over-the-counter ferrous sulfate with orange juice. He feels 'pretty good' and has not noticed any changes since his last visit six months ago. No increased fatigue, dizziness, chest pain, shortness of breath, cough, or bleeding, including blood in stool or urine. His hemoglobin levels have improved over the past few months, with a current level of 10.9, compared to 9.9 in January and 9.1 in December.  He experiences restless leg syndrome, which he describes as 'wearing me out'. He forgot to take his medication for this condition on the morning of the visit.  He mentions experiencing pain on the left side under the ribcage, which starts at the back and spreads. He has a history of inguinal hernia, which sometimes causes sharp pain.        MEDICAL HISTORY: Past Medical History:  Diagnosis Date   Adenomatous colon polyp     Aortic aneurysm (HCC)    BPH (benign prostatic hypertrophy)    CAD (coronary artery disease)    Carotid artery stenosis    a. Bilateral CEA   Cataract    CHF (congestive heart failure) (HCC)    Chronic systolic heart failure (HCC)    a. EF 20-25%, mild LVH, mod HK, mid apicalanteroseptal myocardium, mild MR, LA mod dilated   Collagen vascular disease (HCC)    COPD (chronic obstructive pulmonary disease) (HCC)    Coronary artery disease    a. LHC (08/2013): Lmain: short 30% distal, LAD: sml D1 & D2, 70% ostial D1, 95-99% LAD stenosis prox D2 LCx: sml/mod ramus subtot. occluded, 40% ostial set off lg OM1, 40% AV LCx after OM1, RCA: 90% prox (DES to RCA and prox LAD)   Diverticulosis    GERD (gastroesophageal reflux disease)    Heart murmur    History of colonic polyps    Hyperlipidemia    Hyperplastic colon polyp    Hypertension    Ischemic cardiomyopathy    RLS (restless legs syndrome)     ALLERGIES:  is allergic to codeine.  MEDICATIONS:  Current Outpatient Medications  Medication Sig Dispense Refill   acetaminophen (TYLENOL) 500 MG tablet Take 1,000 mg by mouth at bedtime.     aspirin EC 81 MG tablet Take 81 mg by mouth every evening.      clopidogrel (PLAVIX) 75 MG tablet TAKE 1 TABLET BY MOUTH EVERY DAY 90 tablet 3   cyanocobalamin (VITAMIN B12) 1000 MCG tablet Take 1,000 mcg by  mouth daily.     ezetimibe-simvastatin (VYTORIN) 10-40 MG tablet TAKE 1 TABLET BY MOUTH EVERYDAY AT BEDTIME 90 tablet 3   famotidine (PEPCID) 40 MG tablet Take 40 mg by mouth 2 (two) times daily.     FARXIGA 10 MG TABS tablet TAKE 1 TABLET BY MOUTH DAILY BEFORE BREAKFAST. 30 tablet 11   ferrous sulfate 325 (65 FE) MG tablet Take 325 mg by mouth daily with breakfast.     gabapentin (NEURONTIN) 100 MG capsule Take 1 capsule (100 mg total) by mouth 3 (three) times daily. 270 capsule 0   methocarbamol (ROBAXIN) 500 MG tablet TAKE 1 TABLET (500 MG TOTAL) BY MOUTH EVERY DAY AT BEDTIME AS NEEDED FOR MUSCLE  SPASM 30 tablet 0   metoprolol succinate (TOPROL-XL) 50 MG 24 hr tablet Take 1 tablet (50 mg total) by mouth daily. 90 tablet 3   mirtazapine (REMERON) 15 MG tablet TAKE 1 TABLET BY MOUTH EVERYDAY AT BEDTIME 90 tablet 1   Omega-3 Fatty Acids (FISH OIL) 1000 MG CAPS Take 1 capsule by mouth daily.     rOPINIRole (REQUIP) 3 MG tablet TAKE 1/2 TABLET BY MOUTH IN THE MORNING, 1/2 TABLET MID DAY & TAKE 1 TABLET AT NIGHT 180 tablet 1   spironolactone (ALDACTONE) 25 MG tablet TAKE 1/2 TABLET BY MOUTH DAILY 45 tablet 3   tamsulosin (FLOMAX) 0.4 MG CAPS capsule TAKE 1 CAPSULE BY MOUTH EVERY DAY 90 capsule 3   No current facility-administered medications for this visit.    SURGICAL HISTORY:  Past Surgical History:  Procedure Laterality Date   BUBBLE STUDY  02/03/2022   Procedure: BUBBLE STUDY;  Surgeon: Meriam Sprague, MD;  Location: [facility] ENDOSCOPY;  Service: Cardiovascular;;   cardiac stents  09-2013   CAROTID ENDARTERECTOMY  04/17/2008   right   CAROTID ENDARTERECTOMY  05/30/08   Left   CATARACT EXTRACTION W/PHACO Right 10/05/2018   Procedure: CATARACT EXTRACTION PHACO AND INTRAOCULAR LENS PLACEMENT (IOC), RIGHT;  Surgeon: Galen Manila, MD;  Location: [facility] ORS;  Service: Ophthalmology;  Laterality: Right;  [place] 01:06.4 cde 12.79 Fluid Pack Lot # [id number]   CATARACT EXTRACTION W/PHACO Left 11/02/2018   Procedure: CATARACT EXTRACTION PHACO AND INTRAOCULAR LENS PLACEMENT (IOC), LEFT;  Surgeon: Galen Manila, MD;  Location: [facility] ORS;  Service: Ophthalmology;  Laterality: Left;  [place]  01:10 CDE 11.52 Fluid pack lot # [id number]   CHOLECYSTECTOMY     Gall Bladder   CORONARY ANGIOGRAM  09/11/2013   Procedure: CORONARY ANGIOGRAM;  Surgeon: Lennette Bihari, MD;  Location: [facility] CATH LAB;  Service: Cardiovascular;;   CORONARY ANGIOPLASTY     STENTS   HERNIA REPAIR     lower aorta aneurysm  05/26/2015   [facility]   PERCUTANEOUS STENT INTERVENTION  09/11/2013   Procedure: PERCUTANEOUS STENT INTERVENTION;   Surgeon: Lennette Bihari, MD;  Location: [facility] CATH LAB;  Service: Cardiovascular;;  DES Prox RCA 3.5x15 xience    TEE WITHOUT CARDIOVERSION N/A 02/03/2022   Procedure: TRANSESOPHAGEAL ECHOCARDIOGRAM (TEE);  Surgeon: Meriam Sprague, MD;  Location: [facility] ENDOSCOPY;  Service: Cardiovascular;  Laterality: N/A;    REVIEW OF SYSTEMS:  A comprehensive review of systems was negative except for: Constitutional: positive for fatigue Neurological: positive for restless leg    PHYSICAL EXAMINATION: General appearance: alert, cooperative, fatigued, and no distress Head: Normocephalic, without obvious abnormality, atraumatic Neck: no adenopathy, no JVD, supple, symmetrical, trachea midline, and thyroid not enlarged, symmetric, no tenderness/mass/nodules Lymph nodes: Cervical, supraclavicular, and axillary nodes normal. Resp: clear to  auscultation bilaterally Back: symmetric, no curvature. ROM normal. No CVA tenderness. Cardio: regular rate and rhythm, S1, S2 normal, no murmur, click, rub or gallop GI: soft, non-tender; bowel sounds normal; no masses,  no organomegaly Extremities: extremities normal, atraumatic, no cyanosis or edema  ECOG PERFORMANCE STATUS: 1 - Symptomatic but completely ambulatory  Blood pressure (!) 160/63, pulse 60, temperature 98.4 F (36.9 C), temperature source Temporal, resp. rate 16, height 5\' 6"  (1.676 m), weight 116 lb (52.6 kg), SpO2 100%.  LABORATORY DATA: Lab Results  Component Value Date   WBC 7.5 05/25/2023   HGB 10.9 (L) 05/25/2023   HCT 34.8 (L) 05/25/2023   MCV 86.8 05/25/2023   PLT 303 05/25/2023      Chemistry      Component Value Date/Time   NA 133 (L) 02/24/2023 0630   NA 138 08/18/2018 1135   K 4.0 02/24/2023 0630   CL 103 02/24/2023 0630   CO2 23 02/24/2023 0630   BUN 13 02/24/2023 0630   BUN 19 08/18/2018 1135   CREATININE 0.96 02/24/2023 0630   CREATININE 1.11 10/09/2021 1646      Component Value Date/Time   CALCIUM 8.2 (L) 02/24/2023 0630    ALKPHOS 56 02/24/2023 0630   AST 17 02/24/2023 0630   ALT 13 02/24/2023 0630   BILITOT 0.4 02/24/2023 0630       RADIOGRAPHIC STUDIES: No results found.  ASSESSMENT AND PLAN: This is a very pleasant 83 years old white male with persistent anemia likely anemia of chronic disease plus iron deficiency.  He was treated with iron infusion recently.  He also received 2 units of PRBCs transfusion The patient is also on oral iron tablet with ferrous sulfate once daily with no improvement in his anemia. The patient had a bone marrow biopsy and aspirate that showed no concerning finding for MDS or myeloproliferative disorder. He continues to tolerate the oral iron tablets fairly well.    Anemia of chronic disease with iron deficiency Anemia likely due to chronic disease and iron deficiency, attributed to gastrointestinal blood loss. Previous treatment included iron infusion with Venofer, and currently, he is taking over-the-counter ferrous sulfate with orange juice. Hemoglobin levels have improved from 9.1 in December to 10.9 currently, indicating a positive response to treatment. Awaiting results of iron studies and ferritin to determine the need for further iron infusion. - Await results of iron studies and ferritin. - Arrange for iron infusion if iron studies and ferritin are low. - Continue current regimen of ferrous sulfate with orange juice. - Schedule follow-up in six months.  Restless leg syndrome Reports persistent restless leg syndrome, which he describes as wearing him out. Forgot to take his medication this morning, which may contribute to his symptoms.  Left-sided abdominal pain Reports pain on the left side under the ribcage, starting from the back and spreading. Inguinal hernia sometimes causes sharp pain. No history of kidney stones. Pain should be monitored, and if it worsens, he should contact his family doctor. - Advise to contact family doctor if pain worsens.   The patient  was advised to call immediately if he has any concerning symptoms in the interval.  The patient voices understanding of current disease status and treatment options and is in agreement with the current care plan.  All questions were answered. The patient knows to call the clinic with any problems, questions or concerns. We can certainly see the patient much sooner if necessary.  The total time spent in the appointment was 20  minutes.  Disclaimer: This note was dictated with voice recognition software. Similar sounding words can inadvertently be transcribed and may not be corrected upon review.

## 2023-06-01 DIAGNOSIS — H35341 Macular cyst, hole, or pseudohole, right eye: Secondary | ICD-10-CM | POA: Diagnosis not present

## 2023-06-01 DIAGNOSIS — Z961 Presence of intraocular lens: Secondary | ICD-10-CM | POA: Diagnosis not present

## 2023-06-01 DIAGNOSIS — H35373 Puckering of macula, bilateral: Secondary | ICD-10-CM | POA: Diagnosis not present

## 2023-06-02 ENCOUNTER — Ambulatory Visit (HOSPITAL_COMMUNITY)
Admission: RE | Admit: 2023-06-02 | Discharge: 2023-06-02 | Disposition: A | Discharge status: Home / Self Care | Payer: Medicare Other | Source: Ambulatory Visit | Attending: Vascular Surgery | Admitting: Vascular Surgery

## 2023-06-02 ENCOUNTER — Ambulatory Visit (INDEPENDENT_AMBULATORY_CARE_PROVIDER_SITE_OTHER): Payer: Medicare Other | Admitting: Physician Assistant

## 2023-06-02 ENCOUNTER — Ambulatory Visit (INDEPENDENT_AMBULATORY_CARE_PROVIDER_SITE_OTHER)
Admission: RE | Admit: 2023-06-02 | Discharge: 2023-06-02 | Disposition: A | Discharge status: Home / Self Care | Payer: Medicare Other | Source: Ambulatory Visit | Attending: Vascular Surgery

## 2023-06-02 VITALS — BP 152/62 | HR 57 | Temp 97.6°F | Ht 66.0 in | Wt 113.2 lb

## 2023-06-02 DIAGNOSIS — I6523 Occlusion and stenosis of bilateral carotid arteries: Secondary | ICD-10-CM

## 2023-06-02 DIAGNOSIS — I714 Abdominal aortic aneurysm, without rupture, unspecified: Secondary | ICD-10-CM

## 2023-06-02 DIAGNOSIS — F17200 Nicotine dependence, unspecified, uncomplicated: Secondary | ICD-10-CM

## 2023-06-02 NOTE — Progress Notes (Signed)
 Office Note     CC:  follow up Requesting Provider:  Excell Seltzer, MD  HPI: Erik [name]. is a 83 y.o. ([birth year]) male who presents for surveillance.  He underwent right CEA in February 2010 by Dr. Edilia Bo due to symptomatic stenosis and left CEA in March 2010 by Dr. Edilia Bo due to asymptomatic high-grade stenosis.  He also has surgical history significant for fenestrated EVAR with X fen device in [facility] by Dr. Pattricia Boss in March 2017.  He has no new or changing abdominal or back pain.  AAA sac measured 4.2 cm 1 year ago by duplex.  He denies any strokelike symptoms since last office visit.  He continues to smoke on a daily basis.  He is on a daily aspirin and plavix.   Past Medical History:  Diagnosis Date   Adenomatous colon polyp    Aortic aneurysm (HCC)    BPH (benign prostatic hypertrophy)    CAD (coronary artery disease)    Carotid artery stenosis    a. Bilateral CEA   Cataract    CHF (congestive heart failure) (HCC)    Chronic systolic heart failure (HCC)    a. EF 20-25%, mild LVH, mod HK, mid apicalanteroseptal myocardium, mild MR, LA mod dilated   Collagen vascular disease (HCC)    COPD (chronic obstructive pulmonary disease) (HCC)    Coronary artery disease    a. LHC (08/2013): Lmain: short 30% distal, LAD: sml D1 & D2, 70% ostial D1, 95-99% LAD stenosis prox D2 LCx: sml/mod ramus subtot. occluded, 40% ostial set off lg OM1, 40% AV LCx after OM1, RCA: 90% prox (DES to RCA and prox LAD)   Diverticulosis    GERD (gastroesophageal reflux disease)    Heart murmur    History of colonic polyps    Hyperlipidemia    Hyperplastic colon polyp    Hypertension    Ischemic cardiomyopathy    RLS (restless legs syndrome)     Past Surgical History:  Procedure Laterality Date   BUBBLE STUDY  02/03/2022   Procedure: BUBBLE STUDY;  Surgeon: Meriam Sprague, MD;  Location: [facility] ENDOSCOPY;  Service: Cardiovascular;;   cardiac stents  09-2013   CAROTID ENDARTERECTOMY   04/17/2008   right   CAROTID ENDARTERECTOMY  05/30/08   Left   CATARACT EXTRACTION W/PHACO Right 10/05/2018   Procedure: CATARACT EXTRACTION PHACO AND INTRAOCULAR LENS PLACEMENT (IOC), RIGHT;  Surgeon: Galen Manila, MD;  Location: [facility] ORS;  Service: Ophthalmology;  Laterality: Right;  [place] 01:06.4 cde 12.79 Fluid Pack Lot # [id number]   CATARACT EXTRACTION W/PHACO Left 11/02/2018   Procedure: CATARACT EXTRACTION PHACO AND INTRAOCULAR LENS PLACEMENT (IOC), LEFT;  Surgeon: Galen Manila, MD;  Location: [facility] ORS;  Service: Ophthalmology;  Laterality: Left;  [place]  01:10 CDE 11.52 Fluid pack lot # [id number]   CHOLECYSTECTOMY     Gall Bladder   CORONARY ANGIOGRAM  09/11/2013   Procedure: CORONARY ANGIOGRAM;  Surgeon: Lennette Bihari, MD;  Location: [facility] CATH LAB;  Service: Cardiovascular;;   CORONARY ANGIOPLASTY     STENTS   HERNIA REPAIR     lower aorta aneurysm  05/26/2015   [facility]   PERCUTANEOUS STENT INTERVENTION  09/11/2013   Procedure: PERCUTANEOUS STENT INTERVENTION;  Surgeon: Lennette Bihari, MD;  Location: [facility] CATH LAB;  Service: Cardiovascular;;  DES Prox RCA 3.5x15 xience    TEE WITHOUT CARDIOVERSION N/A 02/03/2022   Procedure: TRANSESOPHAGEAL ECHOCARDIOGRAM (TEE);  Surgeon: Meriam Sprague, MD;  Location: [facility] ENDOSCOPY;  Service: Cardiovascular;  Laterality: N/A;    Social History   Socioeconomic History   Marital status: Married    Spouse name: Not on file   Number of children: Not on file   Years of education: Not on file   Highest education level: Not on file  Occupational History   Occupation: Retired Radiographer, therapeutic  Tobacco Use   Smoking status: Every Day    Current packs/day: 1.00    Average packs/day: 1 pack/day for 50.0 years (50.0 ttl pk-yrs)    Types: Cigarettes    Passive exposure: Never   Smokeless tobacco: Current   Tobacco comments:    off and on since age 4, currently smokes a pack a day [facility] 12/23/20  Vaping Use   Vaping status: Never Used  Substance and Sexual  Activity   Alcohol use: No   Drug use: No   Sexual activity: Yes  Other Topics Concern   Not on file  Social History Narrative   Lives with wife in [place]. Retired from the post [employer]   Social Drivers of Corporate investment banker Strain: Low Risk  (04/29/2021)   Overall Financial Resource Strain (CARDIA)    Difficulty of Paying Living Expenses: Not very hard  Food Insecurity: No Food Insecurity (02/25/2023)   Hunger Vital Sign    Worried About Running Out of Food in the Last Year: Never true    Ran Out of Food in the Last Year: Never true  Transportation Needs: No Transportation Needs (02/25/2023)   PRAPARE - Administrator, Civil Service (Medical): No    Lack of Transportation (Non-Medical): No  Physical Activity: Inactive (05/31/2019)   Exercise Vital Sign    Days of Exercise per Week: 0 days    Minutes of Exercise per Session: 0 min  Stress: No Stress Concern Present (05/31/2019)   [employer] of Occupational Health - Occupational Stress Questionnaire    Feeling of Stress : Not at all  Social Connections: Not on file  Intimate Partner Violence: Not At Risk (02/25/2023)   Humiliation, Afraid, Rape, and Kick questionnaire    Fear of Current or Ex-Partner: No    Emotionally Abused: No    Physically Abused: No    Sexually Abused: No    Family History  Problem Relation Age of Onset   Heart disease Brother    Hyperlipidemia Brother    Pancreatic cancer Brother    Hypertension Mother    Cancer Father        unknown type; sounds GI   Emphysema Father    Hyperlipidemia Brother    Hyperlipidemia Brother    Heart attack Brother    Hyperlipidemia Brother    Multiple sclerosis Brother    Kidney disease Neg Hx    Prostate cancer Neg Hx    Colon cancer Neg Hx    Esophageal cancer Neg Hx    Rectal cancer Neg Hx    Stomach cancer Neg Hx     Current Outpatient Medications  Medication Sig Dispense Refill   acetaminophen (TYLENOL) 500 MG tablet Take  1,000 mg by mouth at bedtime.     aspirin EC 81 MG tablet Take 81 mg by mouth every evening.      clopidogrel (PLAVIX) 75 MG tablet TAKE 1 TABLET BY MOUTH EVERY DAY 90 tablet 3   cyanocobalamin (VITAMIN B12) 1000 MCG tablet Take 1,000 mcg by mouth daily.     ezetimibe-simvastatin (VYTORIN) 10-40 MG tablet TAKE 1 TABLET BY MOUTH EVERYDAY  AT BEDTIME 90 tablet 3   famotidine (PEPCID) 40 MG tablet Take 40 mg by mouth 2 (two) times daily.     FARXIGA 10 MG TABS tablet TAKE 1 TABLET BY MOUTH DAILY BEFORE BREAKFAST. 30 tablet 11   ferrous sulfate 325 (65 FE) MG tablet Take 325 mg by mouth daily with breakfast.     gabapentin (NEURONTIN) 100 MG capsule Take 1 capsule (100 mg total) by mouth 3 (three) times daily. 270 capsule 0   methocarbamol (ROBAXIN) 500 MG tablet TAKE 1 TABLET (500 MG TOTAL) BY MOUTH EVERY DAY AT BEDTIME AS NEEDED FOR MUSCLE SPASM 30 tablet 0   metoprolol succinate (TOPROL-XL) 50 MG 24 hr tablet Take 1 tablet (50 mg total) by mouth daily. 90 tablet 3   mirtazapine (REMERON) 15 MG tablet TAKE 1 TABLET BY MOUTH EVERYDAY AT BEDTIME 90 tablet 1   Omega-3 Fatty Acids (FISH OIL) 1000 MG CAPS Take 1 capsule by mouth daily.     rOPINIRole (REQUIP) 3 MG tablet TAKE 1/2 TABLET BY MOUTH IN THE MORNING, 1/2 TABLET MID DAY & TAKE 1 TABLET AT NIGHT 180 tablet 1   spironolactone (ALDACTONE) 25 MG tablet TAKE 1/2 TABLET BY MOUTH DAILY 45 tablet 3   tamsulosin (FLOMAX) 0.4 MG CAPS capsule TAKE 1 CAPSULE BY MOUTH EVERY DAY 90 capsule 3   No current facility-administered medications for this visit.    Allergies  Allergen Reactions   Codeine Anaphylaxis and Swelling    throat swells     REVIEW OF SYSTEMS:   [X]  denotes positive finding, [ ]  denotes negative finding Cardiac  Comments:  Chest pain or chest pressure:    Shortness of breath upon exertion:    Short of breath when lying flat:    Irregular heart rhythm:        Vascular    Pain in calf, thigh, or hip brought on by ambulation:     Pain in feet at night that wakes you up from your sleep:     Blood clot in your veins:    Leg swelling:         Pulmonary    Oxygen at home:    Productive cough:     Wheezing:         Neurologic    Sudden weakness in arms or legs:     Sudden numbness in arms or legs:     Sudden onset of difficulty speaking or slurred speech:    Temporary loss of vision in one eye:     Problems with dizziness:         Gastrointestinal    Blood in stool:     Vomited blood:         Genitourinary    Burning when urinating:     Blood in urine:        Psychiatric    Major depression:         Hematologic    Bleeding problems:    Problems with blood clotting too easily:        Skin    Rashes or ulcers:        Constitutional    Fever or chills:      PHYSICAL EXAMINATION:  Vitals:   06/02/23 0955  BP: (!) 152/62  Pulse: (!) 57  Temp: 97.6 F (36.4 C)  SpO2: 94%  Weight: 113 lb 3.2 oz (51.3 kg)  Height: 5\' 6"  (1.676 m)    General:  WDWN in NAD; vital signs  documented above Gait: Not observed HENT: WNL, normocephalic Pulmonary: normal non-labored breathing , without Rales, rhonchi,  wheezing Cardiac: regular HR Abdomen: soft, NT, no masses Skin: without rashes Extremities: without ischemic changes, without Gangrene , without cellulitis; without open wounds;  Musculoskeletal: no muscle wasting or atrophy  Neurologic: A&O X 3; CN grossly intact Psychiatric:  The pt has Normal affect.   Non-Invasive Vascular Imaging:   AAA sac measuring 5.6 cm without evidence of endoleak    ASSESSMENT/PLAN:: 83 y.o. 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 Lefebre is an 83 year old male who underwent bilateral carotid endarterectomies in 2010 by Dr. Edilia Bo.  Right sided stenosis was symptomatic.  He denies any neurological events since last office visit.  Duplex  demonstrates widely patent endarterectomy sites.  We will repeat carotid duplex in another year.  Encouraged smoking cessation but he has no interest in quitting.  He is also followed for endovascular repair of juxtarenal AAA by Dr. Pattricia Boss in 2017.  AAA sac has increased from 4.2 cm to 5.6 cm by duplex in 1 year.  We will check a CTA abdomen and pelvis in the next couple weeks.  He will follow-up with Dr. Randie Heinz to review the results.   Emilie Rutter, PA-C [facility] [phone]  Clinic MD:   Randie Heinz

## 2023-06-03 ENCOUNTER — Other Ambulatory Visit: Payer: Self-pay

## 2023-06-03 DIAGNOSIS — I714 Abdominal aortic aneurysm, without rupture, unspecified: Secondary | ICD-10-CM

## 2023-06-09 ENCOUNTER — Other Ambulatory Visit: Payer: Self-pay | Admitting: Family Medicine

## 2023-06-09 ENCOUNTER — Ambulatory Visit
Admission: RE | Admit: 2023-06-09 | Discharge: 2023-06-09 | Disposition: A | Discharge status: Home / Self Care | Source: Ambulatory Visit | Attending: Vascular Surgery

## 2023-06-09 DIAGNOSIS — I714 Abdominal aortic aneurysm, without rupture, unspecified: Secondary | ICD-10-CM

## 2023-06-09 DIAGNOSIS — Z9889 Other specified postprocedural states: Secondary | ICD-10-CM | POA: Diagnosis not present

## 2023-06-09 DIAGNOSIS — I7143 Infrarenal abdominal aortic aneurysm, without rupture: Secondary | ICD-10-CM | POA: Diagnosis not present

## 2023-06-09 MED ORDER — IOPAMIDOL (ISOVUE-370) INJECTION 76%
75.0000 mL | Freq: Once | INTRAVENOUS | Status: AC | PRN
  Administered 2023-06-09: 75 mL via INTRAVENOUS

## 2023-06-09 NOTE — Telephone Encounter (Signed)
 Last office visit 03/17/2023 for Pneumonia.  Last refilled 05/18/2023 for #30 with no refills.  Next Appt: No future appointment with PCP.

## 2023-06-19 ENCOUNTER — Inpatient Hospital Stay (HOSPITAL_BASED_OUTPATIENT_CLINIC_OR_DEPARTMENT_OTHER)
Admission: EM | Admit: 2023-06-19 | Discharge: 2023-07-03 | DRG: 329 | Disposition: A | Discharge status: Hospice | Attending: Internal Medicine | Admitting: Internal Medicine

## 2023-06-19 ENCOUNTER — Emergency Department (HOSPITAL_BASED_OUTPATIENT_CLINIC_OR_DEPARTMENT_OTHER)

## 2023-06-19 ENCOUNTER — Encounter (HOSPITAL_BASED_OUTPATIENT_CLINIC_OR_DEPARTMENT_OTHER): Payer: Self-pay | Admitting: Emergency Medicine

## 2023-06-19 ENCOUNTER — Other Ambulatory Visit: Payer: Self-pay

## 2023-06-19 DIAGNOSIS — R579 Shock, unspecified: Secondary | ICD-10-CM | POA: Diagnosis not present

## 2023-06-19 DIAGNOSIS — R1110 Vomiting, unspecified: Secondary | ICD-10-CM | POA: Diagnosis not present

## 2023-06-19 DIAGNOSIS — R100 Acute abdomen: Secondary | ICD-10-CM | POA: Diagnosis not present

## 2023-06-19 DIAGNOSIS — J439 Emphysema, unspecified: Secondary | ICD-10-CM | POA: Diagnosis not present

## 2023-06-19 DIAGNOSIS — R571 Hypovolemic shock: Secondary | ICD-10-CM | POA: Diagnosis not present

## 2023-06-19 DIAGNOSIS — K55059 Acute (reversible) ischemia of intestine, part and extent unspecified: Secondary | ICD-10-CM | POA: Diagnosis present

## 2023-06-19 DIAGNOSIS — Z720 Tobacco use: Secondary | ICD-10-CM | POA: Diagnosis not present

## 2023-06-19 DIAGNOSIS — Z8673 Personal history of transient ischemic attack (TIA), and cerebral infarction without residual deficits: Secondary | ICD-10-CM

## 2023-06-19 DIAGNOSIS — Z9911 Dependence on respirator [ventilator] status: Secondary | ICD-10-CM | POA: Diagnosis not present

## 2023-06-19 DIAGNOSIS — D62 Acute posthemorrhagic anemia: Secondary | ICD-10-CM | POA: Diagnosis not present

## 2023-06-19 DIAGNOSIS — K219 Gastro-esophageal reflux disease without esophagitis: Secondary | ICD-10-CM | POA: Diagnosis present

## 2023-06-19 DIAGNOSIS — Y848 Other medical procedures as the cause of abnormal reaction of the patient, or of later complication, without mention of misadventure at the time of the procedure: Secondary | ICD-10-CM | POA: Diagnosis present

## 2023-06-19 DIAGNOSIS — G35 Multiple sclerosis: Secondary | ICD-10-CM | POA: Diagnosis present

## 2023-06-19 DIAGNOSIS — Z515 Encounter for palliative care: Secondary | ICD-10-CM | POA: Diagnosis not present

## 2023-06-19 DIAGNOSIS — Z7902 Long term (current) use of antithrombotics/antiplatelets: Secondary | ICD-10-CM

## 2023-06-19 DIAGNOSIS — B9562 Methicillin resistant Staphylococcus aureus infection as the cause of diseases classified elsewhere: Secondary | ICD-10-CM | POA: Diagnosis not present

## 2023-06-19 DIAGNOSIS — I110 Hypertensive heart disease with heart failure: Secondary | ICD-10-CM | POA: Diagnosis not present

## 2023-06-19 DIAGNOSIS — N179 Acute kidney failure, unspecified: Secondary | ICD-10-CM | POA: Diagnosis not present

## 2023-06-19 DIAGNOSIS — I38 Endocarditis, valve unspecified: Secondary | ICD-10-CM | POA: Diagnosis not present

## 2023-06-19 DIAGNOSIS — Z9889 Other specified postprocedural states: Secondary | ICD-10-CM | POA: Diagnosis not present

## 2023-06-19 DIAGNOSIS — K529 Noninfective gastroenteritis and colitis, unspecified: Secondary | ICD-10-CM | POA: Diagnosis not present

## 2023-06-19 DIAGNOSIS — I509 Heart failure, unspecified: Secondary | ICD-10-CM | POA: Diagnosis not present

## 2023-06-19 DIAGNOSIS — B9561 Methicillin susceptible Staphylococcus aureus infection as the cause of diseases classified elsewhere: Secondary | ICD-10-CM | POA: Diagnosis not present

## 2023-06-19 DIAGNOSIS — E8721 Acute metabolic acidosis: Secondary | ICD-10-CM | POA: Diagnosis not present

## 2023-06-19 DIAGNOSIS — S35228A Other injury of superior mesenteric artery, initial encounter: Secondary | ICD-10-CM | POA: Diagnosis not present

## 2023-06-19 DIAGNOSIS — N401 Enlarged prostate with lower urinary tract symptoms: Secondary | ICD-10-CM | POA: Diagnosis present

## 2023-06-19 DIAGNOSIS — J811 Chronic pulmonary edema: Secondary | ICD-10-CM | POA: Diagnosis not present

## 2023-06-19 DIAGNOSIS — K551 Chronic vascular disorders of intestine: Secondary | ICD-10-CM | POA: Diagnosis not present

## 2023-06-19 DIAGNOSIS — K5900 Constipation, unspecified: Secondary | ICD-10-CM | POA: Diagnosis not present

## 2023-06-19 DIAGNOSIS — G2581 Restless legs syndrome: Secondary | ICD-10-CM | POA: Diagnosis not present

## 2023-06-19 DIAGNOSIS — T827XXA Infection and inflammatory reaction due to other cardiac and vascular devices, implants and grafts, initial encounter: Secondary | ICD-10-CM | POA: Diagnosis not present

## 2023-06-19 DIAGNOSIS — R1312 Dysphagia, oropharyngeal phase: Secondary | ICD-10-CM | POA: Diagnosis not present

## 2023-06-19 DIAGNOSIS — I7143 Infrarenal abdominal aortic aneurysm, without rupture: Secondary | ICD-10-CM | POA: Diagnosis not present

## 2023-06-19 DIAGNOSIS — I2510 Atherosclerotic heart disease of native coronary artery without angina pectoris: Secondary | ICD-10-CM

## 2023-06-19 DIAGNOSIS — Z9841 Cataract extraction status, right eye: Secondary | ICD-10-CM

## 2023-06-19 DIAGNOSIS — S36899A Unspecified injury of other intra-abdominal organs, initial encounter: Secondary | ICD-10-CM | POA: Diagnosis not present

## 2023-06-19 DIAGNOSIS — F17210 Nicotine dependence, cigarettes, uncomplicated: Secondary | ICD-10-CM | POA: Diagnosis not present

## 2023-06-19 DIAGNOSIS — I5020 Unspecified systolic (congestive) heart failure: Secondary | ICD-10-CM | POA: Diagnosis not present

## 2023-06-19 DIAGNOSIS — R7402 Elevation of levels of lactic acid dehydrogenase (LDH): Secondary | ICD-10-CM | POA: Diagnosis not present

## 2023-06-19 DIAGNOSIS — N138 Other obstructive and reflux uropathy: Secondary | ICD-10-CM | POA: Diagnosis not present

## 2023-06-19 DIAGNOSIS — I6523 Occlusion and stenosis of bilateral carotid arteries: Secondary | ICD-10-CM | POA: Diagnosis present

## 2023-06-19 DIAGNOSIS — R918 Other nonspecific abnormal finding of lung field: Secondary | ICD-10-CM | POA: Diagnosis not present

## 2023-06-19 DIAGNOSIS — E8809 Other disorders of plasma-protein metabolism, not elsewhere classified: Secondary | ICD-10-CM | POA: Diagnosis present

## 2023-06-19 DIAGNOSIS — E785 Hyperlipidemia, unspecified: Secondary | ICD-10-CM | POA: Diagnosis not present

## 2023-06-19 DIAGNOSIS — E870 Hyperosmolality and hypernatremia: Secondary | ICD-10-CM | POA: Diagnosis present

## 2023-06-19 DIAGNOSIS — I714 Abdominal aortic aneurysm, without rupture, unspecified: Secondary | ICD-10-CM | POA: Diagnosis not present

## 2023-06-19 DIAGNOSIS — Z9861 Coronary angioplasty status: Secondary | ICD-10-CM | POA: Diagnosis not present

## 2023-06-19 DIAGNOSIS — D696 Thrombocytopenia, unspecified: Secondary | ICD-10-CM | POA: Diagnosis not present

## 2023-06-19 DIAGNOSIS — J9601 Acute respiratory failure with hypoxia: Secondary | ICD-10-CM | POA: Diagnosis not present

## 2023-06-19 DIAGNOSIS — R0989 Other specified symptoms and signs involving the circulatory and respiratory systems: Secondary | ICD-10-CM | POA: Diagnosis not present

## 2023-06-19 DIAGNOSIS — D649 Anemia, unspecified: Secondary | ICD-10-CM | POA: Insufficient documentation

## 2023-06-19 DIAGNOSIS — J449 Chronic obstructive pulmonary disease, unspecified: Secondary | ICD-10-CM | POA: Diagnosis present

## 2023-06-19 DIAGNOSIS — N400 Enlarged prostate without lower urinary tract symptoms: Secondary | ICD-10-CM | POA: Diagnosis not present

## 2023-06-19 DIAGNOSIS — Z4682 Encounter for fitting and adjustment of non-vascular catheter: Secondary | ICD-10-CM | POA: Diagnosis not present

## 2023-06-19 DIAGNOSIS — K5530 Necrotizing enterocolitis, unspecified: Secondary | ICD-10-CM | POA: Diagnosis not present

## 2023-06-19 DIAGNOSIS — R1084 Generalized abdominal pain: Secondary | ICD-10-CM | POA: Diagnosis not present

## 2023-06-19 DIAGNOSIS — Z961 Presence of intraocular lens: Secondary | ICD-10-CM | POA: Diagnosis present

## 2023-06-19 DIAGNOSIS — K5651 Intestinal adhesions [bands], with partial obstruction: Principal | ICD-10-CM | POA: Diagnosis present

## 2023-06-19 DIAGNOSIS — Z95828 Presence of other vascular implants and grafts: Secondary | ICD-10-CM | POA: Diagnosis not present

## 2023-06-19 DIAGNOSIS — D72829 Elevated white blood cell count, unspecified: Secondary | ICD-10-CM | POA: Diagnosis not present

## 2023-06-19 DIAGNOSIS — R6521 Severe sepsis with septic shock: Secondary | ICD-10-CM | POA: Diagnosis not present

## 2023-06-19 DIAGNOSIS — R0602 Shortness of breath: Secondary | ICD-10-CM | POA: Diagnosis not present

## 2023-06-19 DIAGNOSIS — R7881 Bacteremia: Secondary | ICD-10-CM | POA: Diagnosis not present

## 2023-06-19 DIAGNOSIS — E43 Unspecified severe protein-calorie malnutrition: Secondary | ICD-10-CM | POA: Diagnosis not present

## 2023-06-19 DIAGNOSIS — Y92239 Unspecified place in hospital as the place of occurrence of the external cause: Secondary | ICD-10-CM | POA: Diagnosis not present

## 2023-06-19 DIAGNOSIS — I255 Ischemic cardiomyopathy: Secondary | ICD-10-CM | POA: Diagnosis present

## 2023-06-19 DIAGNOSIS — Y832 Surgical operation with anastomosis, bypass or graft as the cause of abnormal reaction of the patient, or of later complication, without mention of misadventure at the time of the procedure: Secondary | ICD-10-CM | POA: Diagnosis not present

## 2023-06-19 DIAGNOSIS — Z681 Body mass index (BMI) 19 or less, adult: Secondary | ICD-10-CM

## 2023-06-19 DIAGNOSIS — R0600 Dyspnea, unspecified: Secondary | ICD-10-CM | POA: Diagnosis not present

## 2023-06-19 DIAGNOSIS — K921 Melena: Secondary | ICD-10-CM | POA: Diagnosis not present

## 2023-06-19 DIAGNOSIS — T80219A Unspecified infection due to central venous catheter, initial encounter: Secondary | ICD-10-CM | POA: Diagnosis not present

## 2023-06-19 DIAGNOSIS — J984 Other disorders of lung: Secondary | ICD-10-CM | POA: Diagnosis not present

## 2023-06-19 DIAGNOSIS — Z8679 Personal history of other diseases of the circulatory system: Secondary | ICD-10-CM

## 2023-06-19 DIAGNOSIS — J9690 Respiratory failure, unspecified, unspecified whether with hypoxia or hypercapnia: Secondary | ICD-10-CM | POA: Diagnosis not present

## 2023-06-19 DIAGNOSIS — B9620 Unspecified Escherichia coli [E. coli] as the cause of diseases classified elsewhere: Secondary | ICD-10-CM | POA: Diagnosis not present

## 2023-06-19 DIAGNOSIS — Z9049 Acquired absence of other specified parts of digestive tract: Secondary | ICD-10-CM | POA: Diagnosis not present

## 2023-06-19 DIAGNOSIS — J690 Pneumonitis due to inhalation of food and vomit: Secondary | ICD-10-CM | POA: Diagnosis not present

## 2023-06-19 DIAGNOSIS — K56609 Unspecified intestinal obstruction, unspecified as to partial versus complete obstruction: Secondary | ICD-10-CM | POA: Diagnosis not present

## 2023-06-19 DIAGNOSIS — Z885 Allergy status to narcotic agent status: Secondary | ICD-10-CM

## 2023-06-19 DIAGNOSIS — Z7982 Long term (current) use of aspirin: Secondary | ICD-10-CM

## 2023-06-19 DIAGNOSIS — K55049 Acute infarction of large intestine, extent unspecified: Secondary | ICD-10-CM | POA: Diagnosis not present

## 2023-06-19 DIAGNOSIS — K55039 Acute (reversible) ischemia of large intestine, extent unspecified: Secondary | ICD-10-CM | POA: Diagnosis not present

## 2023-06-19 DIAGNOSIS — F05 Delirium due to known physiological condition: Secondary | ICD-10-CM | POA: Diagnosis not present

## 2023-06-19 DIAGNOSIS — R509 Fever, unspecified: Secondary | ICD-10-CM | POA: Diagnosis not present

## 2023-06-19 DIAGNOSIS — A499 Bacterial infection, unspecified: Secondary | ICD-10-CM | POA: Diagnosis not present

## 2023-06-19 DIAGNOSIS — E8729 Other acidosis: Secondary | ICD-10-CM | POA: Diagnosis not present

## 2023-06-19 DIAGNOSIS — Z860101 Personal history of adenomatous and serrated colon polyps: Secondary | ICD-10-CM

## 2023-06-19 DIAGNOSIS — K659 Peritonitis, unspecified: Secondary | ICD-10-CM | POA: Diagnosis not present

## 2023-06-19 DIAGNOSIS — J90 Pleural effusion, not elsewhere classified: Secondary | ICD-10-CM | POA: Diagnosis not present

## 2023-06-19 DIAGNOSIS — Z452 Encounter for adjustment and management of vascular access device: Secondary | ICD-10-CM | POA: Diagnosis not present

## 2023-06-19 DIAGNOSIS — Z66 Do not resuscitate: Secondary | ICD-10-CM | POA: Diagnosis present

## 2023-06-19 DIAGNOSIS — R109 Unspecified abdominal pain: Secondary | ICD-10-CM | POA: Diagnosis present

## 2023-06-19 DIAGNOSIS — I739 Peripheral vascular disease, unspecified: Secondary | ICD-10-CM | POA: Diagnosis present

## 2023-06-19 DIAGNOSIS — T82330A Leakage of aortic (bifurcation) graft (replacement), initial encounter: Secondary | ICD-10-CM | POA: Diagnosis not present

## 2023-06-19 DIAGNOSIS — T508X5A Adverse effect of diagnostic agents, initial encounter: Secondary | ICD-10-CM | POA: Diagnosis not present

## 2023-06-19 DIAGNOSIS — I700 Atherosclerosis of aorta: Secondary | ICD-10-CM | POA: Diagnosis not present

## 2023-06-19 DIAGNOSIS — A419 Sepsis, unspecified organism: Secondary | ICD-10-CM | POA: Diagnosis not present

## 2023-06-19 DIAGNOSIS — S36408A Unspecified injury of other part of small intestine, initial encounter: Secondary | ICD-10-CM | POA: Diagnosis not present

## 2023-06-19 DIAGNOSIS — I708 Atherosclerosis of other arteries: Secondary | ICD-10-CM | POA: Diagnosis present

## 2023-06-19 DIAGNOSIS — Z83438 Family history of other disorder of lipoprotein metabolism and other lipidemia: Secondary | ICD-10-CM

## 2023-06-19 DIAGNOSIS — Z79899 Other long term (current) drug therapy: Secondary | ICD-10-CM

## 2023-06-19 DIAGNOSIS — I771 Stricture of artery: Secondary | ICD-10-CM | POA: Diagnosis present

## 2023-06-19 DIAGNOSIS — R6511 Systemic inflammatory response syndrome (SIRS) of non-infectious origin with acute organ dysfunction: Secondary | ICD-10-CM | POA: Diagnosis not present

## 2023-06-19 DIAGNOSIS — S36898A Other injury of other intra-abdominal organs, initial encounter: Secondary | ICD-10-CM | POA: Diagnosis not present

## 2023-06-19 DIAGNOSIS — I5022 Chronic systolic (congestive) heart failure: Secondary | ICD-10-CM | POA: Diagnosis not present

## 2023-06-19 DIAGNOSIS — K658 Other peritonitis: Secondary | ICD-10-CM | POA: Diagnosis not present

## 2023-06-19 DIAGNOSIS — E876 Hypokalemia: Secondary | ICD-10-CM | POA: Diagnosis not present

## 2023-06-19 DIAGNOSIS — Z825 Family history of asthma and other chronic lower respiratory diseases: Secondary | ICD-10-CM

## 2023-06-19 DIAGNOSIS — Z9842 Cataract extraction status, left eye: Secondary | ICD-10-CM

## 2023-06-19 DIAGNOSIS — R54 Age-related physical debility: Secondary | ICD-10-CM | POA: Diagnosis present

## 2023-06-19 DIAGNOSIS — R0902 Hypoxemia: Secondary | ICD-10-CM | POA: Diagnosis not present

## 2023-06-19 DIAGNOSIS — Z7189 Other specified counseling: Secondary | ICD-10-CM | POA: Diagnosis not present

## 2023-06-19 DIAGNOSIS — I10 Essential (primary) hypertension: Secondary | ICD-10-CM | POA: Diagnosis not present

## 2023-06-19 DIAGNOSIS — R140 Abdominal distension (gaseous): Secondary | ICD-10-CM | POA: Diagnosis not present

## 2023-06-19 DIAGNOSIS — K660 Peritoneal adhesions (postprocedural) (postinfection): Secondary | ICD-10-CM | POA: Diagnosis not present

## 2023-06-19 DIAGNOSIS — Z8249 Family history of ischemic heart disease and other diseases of the circulatory system: Secondary | ICD-10-CM

## 2023-06-19 DIAGNOSIS — Z95820 Peripheral vascular angioplasty status with implants and grafts: Secondary | ICD-10-CM | POA: Diagnosis not present

## 2023-06-19 DIAGNOSIS — K449 Diaphragmatic hernia without obstruction or gangrene: Secondary | ICD-10-CM | POA: Diagnosis not present

## 2023-06-19 DIAGNOSIS — J929 Pleural plaque without asbestos: Secondary | ICD-10-CM | POA: Diagnosis not present

## 2023-06-19 DIAGNOSIS — T80219D Unspecified infection due to central venous catheter, subsequent encounter: Secondary | ICD-10-CM | POA: Diagnosis not present

## 2023-06-19 DIAGNOSIS — A4902 Methicillin resistant Staphylococcus aureus infection, unspecified site: Secondary | ICD-10-CM | POA: Diagnosis not present

## 2023-06-19 LAB — URINALYSIS, ROUTINE W REFLEX MICROSCOPIC
Bacteria, UA: NONE SEEN
Bilirubin Urine: NEGATIVE
Glucose, UA: >1000 mg/dL — AB
Hgb urine dipstick: NEGATIVE
Ketones, ur: NEGATIVE mg/dL
Leukocytes,Ua: NEGATIVE
Nitrite: NEGATIVE
Protein, ur: NEGATIVE mg/dL
Specific Gravity, Urine: 1.025 (ref 1.005–1.030)
pH: 5.5 (ref 5.0–8.0)

## 2023-06-19 LAB — CBC
HCT: 31 % — ABNORMAL LOW (ref 39.0–52.0)
Hemoglobin: 9.8 g/dL — ABNORMAL LOW (ref 13.0–17.0)
MCH: 27.7 pg (ref 26.0–34.0)
MCHC: 31.6 g/dL (ref 30.0–36.0)
MCV: 87.6 fL (ref 80.0–100.0)
Platelets: 280 10*3/uL (ref 150–400)
RBC: 3.54 MIL/uL — ABNORMAL LOW (ref 4.22–5.81)
RDW: 15.8 % — ABNORMAL HIGH (ref 11.5–15.5)
WBC: 9.2 10*3/uL (ref 4.0–10.5)
nRBC: 0 % (ref 0.0–0.2)

## 2023-06-19 LAB — BASIC METABOLIC PANEL WITH GFR
Anion gap: 9 (ref 5–15)
BUN: 22 mg/dL (ref 8–23)
CO2: 23 mmol/L (ref 22–32)
Calcium: 8.8 mg/dL — ABNORMAL LOW (ref 8.9–10.3)
Chloride: 103 mmol/L (ref 98–111)
Creatinine, Ser: 0.98 mg/dL (ref 0.61–1.24)
GFR, Estimated: >60 mL/min (ref >60)
Glucose, Bld: 104 mg/dL — ABNORMAL HIGH (ref 70–99)
Potassium: 4.2 mmol/L (ref 3.5–5.1)
Sodium: 135 mmol/L (ref 135–145)

## 2023-06-19 MED ORDER — ONDANSETRON HCL 4 MG/2ML IJ SOLN
4.0000 mg | Freq: Once | INTRAMUSCULAR | Status: AC
  Administered 2023-06-19: 4 mg via INTRAVENOUS
  Filled 2023-06-19: qty 2

## 2023-06-19 MED ORDER — ROPINIROLE HCL 1 MG PO TABS: 3.0000 mg | ORAL_TABLET | Freq: Once | ORAL | Status: DC

## 2023-06-19 MED ORDER — IOHEXOL 350 MG/ML SOLN
100.0000 mL | Freq: Once | INTRAVENOUS | Status: AC | PRN
  Administered 2023-06-19: 100 mL via INTRAVENOUS

## 2023-06-19 MED ORDER — HYDROMORPHONE HCL 1 MG/ML IJ SOLN
0.5000 mg | Freq: Once | INTRAMUSCULAR | Status: AC
  Administered 2023-06-19: 0.5 mg via INTRAVENOUS
  Filled 2023-06-19: qty 1

## 2023-06-19 NOTE — ED Provider Notes (Signed)
 [place] EMERGENCY DEPARTMENT AT [facility] Provider Note   CSN: [id number] Arrival date & time: 06/19/23  1827     History  Chief Complaint  Patient presents with   Flank Pain    Erik [name]. is a 83 y.o. male.  Patient to ED for evaluation of left flank and left side abdominal pain that started today. It started in the back and extended to the abdomen through the day. He has had nausea without vomiting. No fever. No urinary symptoms. He states he may have had a kidney stone in the remote past.   The history is provided by the patient and the spouse. No language interpreter was used.  Flank Pain       Home Medications Prior to Admission medications   Medication Sig Start Date End Date Taking? Authorizing Provider  acetaminophen (TYLENOL) 500 MG tablet Take 1,000 mg by mouth at bedtime.    [provider]  aspirin EC 81 MG tablet Take 81 mg by mouth every evening.     [provider]  clopidogrel (PLAVIX) 75 MG tablet TAKE 1 TABLET BY MOUTH EVERY DAY 11/03/22   Laurey Morale, MD  cyanocobalamin (VITAMIN B12) 1000 MCG tablet Take 1,000 mcg by mouth daily.    [provider]  ezetimibe-simvastatin (VYTORIN) 10-40 MG tablet TAKE 1 TABLET BY MOUTH EVERYDAY AT BEDTIME 10/06/22   Bedsole, Amy E, MD  famotidine (PEPCID) 40 MG tablet Take 40 mg by mouth 2 (two) times daily.    [provider]  FARXIGA 10 MG TABS tablet TAKE 1 TABLET BY MOUTH DAILY BEFORE BREAKFAST. 11/29/22   Laurey Morale, MD  ferrous sulfate 325 (65 FE) MG tablet Take 325 mg by mouth daily with breakfast.    [provider]  gabapentin (NEURONTIN) 100 MG capsule Take 1 capsule (100 mg total) by mouth 3 (three) times daily. 03/29/23   Bedsole, Amy E, MD  methocarbamol (ROBAXIN) 500 MG tablet TAKE 1 TABLET (500 MG TOTAL) BY MOUTH EVERY DAY AT BEDTIME AS NEEDED FOR MUSCLE SPASM 06/09/23   Bedsole, Amy E, MD  metoprolol succinate (TOPROL-XL) 50 MG 24 hr  tablet Take 1 tablet (50 mg total) by mouth daily. 03/29/23   Laurey Morale, MD  mirtazapine (REMERON) 15 MG tablet TAKE 1 TABLET BY MOUTH EVERYDAY AT BEDTIME 03/07/23   Bedsole, Amy E, MD  Omega-3 Fatty Acids (FISH OIL) 1000 MG CAPS Take 1 capsule by mouth daily.    [provider]  rOPINIRole (REQUIP) 3 MG tablet TAKE 1/2 TABLET BY MOUTH IN THE MORNING, 1/2 TABLET MID DAY & TAKE 1 TABLET AT NIGHT 02/08/23   Bedsole, Amy E, MD  spironolactone (ALDACTONE) 25 MG tablet TAKE 1/2 TABLET BY MOUTH DAILY 03/29/23   Laurey Morale, MD  tamsulosin (FLOMAX) 0.4 MG CAPS capsule TAKE 1 CAPSULE BY MOUTH EVERY DAY 09/08/22   Excell Seltzer, MD      Allergies    Codeine    Review of Systems   Review of Systems  Genitourinary:  Positive for flank pain.    Physical Exam Updated Vital Signs BP 138/66 (BP Location: Right Arm)   Pulse 79   Temp 98.3 F (36.8 C) (Oral)   Resp 18   Ht 5\' 6"  (1.676 m)   Wt 50.8 kg   SpO2 95%   BMI 18.08 kg/m  Physical Exam Vitals and nursing note reviewed.  Constitutional:      Appearance: Normal appearance.  Comments: Uncomfortable appearing.   Cardiovascular:     Rate and Rhythm: Normal rate and regular rhythm.  Pulmonary:     Effort: Pulmonary effort is normal.     Breath sounds: No wheezing.  Abdominal:     Palpations: Abdomen is soft.     Tenderness: There is abdominal tenderness (Tender over left mid- and lower abdomen.). There is left CVA tenderness.  Skin:    General: Skin is warm and dry.  Neurological:     Mental Status: He is alert and oriented to person, place, and time.     ED Results / Procedures / Treatments   Labs (all labs ordered are listed, but only abnormal results are displayed) Labs Reviewed  URINALYSIS, ROUTINE W REFLEX MICROSCOPIC - Abnormal; Notable for the following components:      Result Value   Glucose, UA >1,000 (*)    All other components within normal limits  BASIC METABOLIC PANEL WITH GFR - Abnormal;  Notable for the following components:   Glucose, Bld 104 (*)    Calcium 8.8 (*)    All other components within normal limits  CBC - Abnormal; Notable for the following components:   RBC 3.54 (*)    Hemoglobin 9.8 (*)    HCT 31.0 (*)    RDW 15.8 (*)    All other components within normal limits   Results for orders placed or performed during the hospital encounter of 06/19/23  Basic metabolic panel   Collection Time: 06/19/23  8:00 PM  Result Value Ref Range   Sodium 135 135 - 145 mmol/L   Potassium 4.2 3.5 - 5.1 mmol/L   Chloride 103 98 - 111 mmol/L   CO2 23 22 - 32 mmol/L   Glucose, Bld 104 (H) 70 - 99 mg/dL   BUN 22 8 - 23 mg/dL   Creatinine, Ser 2.84 0.61 - 1.24 mg/dL   Calcium 8.8 (L) 8.9 - 10.3 mg/dL   GFR, Estimated >13 >24 mL/min   Anion gap 9 5 - 15  CBC   Collection Time: 06/19/23  8:00 PM  Result Value Ref Range   WBC 9.2 4.0 - 10.5 K/uL   RBC 3.54 (L) 4.22 - 5.81 MIL/uL   Hemoglobin 9.8 (L) 13.0 - 17.0 g/dL   HCT 40.1 (L) 02.7 - 25.3 %   MCV 87.6 80.0 - 100.0 fL   MCH 27.7 26.0 - 34.0 pg   MCHC 31.6 30.0 - 36.0 g/dL   RDW 66.4 (H) 40.3 - 47.4 %   Platelets 280 150 - 400 K/uL   nRBC 0.0 0.0 - 0.2 %  Urinalysis, Routine w reflex microscopic -Urine, Unspecified Source   Collection Time: 06/19/23  8:22 PM  Result Value Ref Range   Color, Urine YELLOW YELLOW   APPearance CLEAR CLEAR   Specific Gravity, Urine 1.025 1.005 - 1.030   pH 5.5 5.0 - 8.0   Glucose, UA >1,000 (A) NEGATIVE mg/dL   Hgb urine dipstick NEGATIVE NEGATIVE   Bilirubin Urine NEGATIVE NEGATIVE   Ketones, ur NEGATIVE NEGATIVE mg/dL   Protein, ur NEGATIVE NEGATIVE mg/dL   Nitrite NEGATIVE NEGATIVE   Leukocytes,Ua NEGATIVE NEGATIVE   RBC / HPF 0-5 0 - 5 RBC/hpf   WBC, UA 0-5 0 - 5 WBC/hpf   Bacteria, UA NONE SEEN NONE SEEN   Squamous Epithelial / HPF 0-5 0 - 5 /HPF    EKG None  Radiology CT Angio Abd/Pel W and/or Wo Contrast Result Date: 06/19/2023 CLINICAL DATA:  Abdominal  pain EXAM:  CTA ABDOMEN AND PELVIS WITHOUT AND WITH CONTRAST TECHNIQUE: Multidetector CT imaging of the abdomen and pelvis was performed using the standard protocol during bolus administration of intravenous contrast. Multiplanar reconstructed images and MIPs were obtained and reviewed to evaluate the vascular anatomy. RADIATION DOSE REDUCTION: This exam was performed according to the departmental dose-optimization program which includes automated exposure control, adjustment of the mA and/or kV according to patient size and/or use of iterative reconstruction technique. CONTRAST:  OMNIPAQUE IOHEXOL 350 MG/ML SOLN COMPARISON:  CT 06/09/2023 FINDINGS: VASCULAR Aorta: Status post endovascular repair of aortic aneurysm with endovascular stent extending from the distal descending thoracic aorta to the iliac vessels. Patent stent. Maximum diameter of excluded aneurysm sac measures 5.4 cm, previously 5.4 cm. 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 Distal patency with no aneurysm or dissection. 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 Distal vascular patency and no dissection or aneurysm Renals: Bilateral renal artery stents which appear patent IMA: Occluded as before. Inflow: Patent common iliac artery stents. External iliac arteries are widely patent. Stenosis at the origin of the internal iliac arteries. Proximal Outflow: Bilateral common femoral and visualized portions of the superficial and profunda femoral arteries are patent without evidence of aneurysm, dissection, vasculitis or significant stenosis. Veins: Suboptimally evaluated Review of the MIP images confirms the above findings. NON-VASCULAR Lower chest: Lung bases demonstrate emphysema. No acute airspace disease. Hepatobiliary: Cholecystectomy.  No biliary dilatation. Pancreas: No inflammation. Ductal dilatation up to 5 mm which is chronic. Spleen: Normal in size without focal abnormality.  Adrenals/Urinary Tract: Adrenal glands are normal. Kidneys show no hydronephrosis. The bladder is unremarkable Stomach/Bowel: Moderate fluid distension of the stomach. Interval development of multiple fluid-filled mildly dilated loops of small bowel measuring up to 3.1 cm. No acute bowel wall thickening. Large volume frothy stool in the right colon. Lymphatic: No suspicious lymph nodes Reproductive: Enlarged prostate gland Other: Negative for pelvic effusion or free air Musculoskeletal: No acute or suspicious osseous abnormality. IMPRESSION: VASCULAR 1. 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 2. 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 NON-VASCULAR 1. Interim development of multiple fluid-filled mildly dilated loops of small bowel, without well-defined transition point suggestive of mild ileus or partial/developing bowel obstruction. 2. Enlarged prostate Electronically Signed   By: Jasmine Pang M.D.   On: 06/19/2023 23:10    Procedures Procedures    Medications Ordered in ED Medications  rOPINIRole (REQUIP) tablet 3 mg (3 mg Oral Not Given 06/19/23 2034)  HYDROmorphone (DILAUDID) injection 0.5 mg (0.5 mg Intravenous Given 06/19/23 2024)  ondansetron (ZOFRAN) injection 4 mg (4 mg Intravenous Given 06/19/23 2022)  iohexol (OMNIPAQUE) 350 MG/ML injection 100 mL (100 mLs Intravenous Contrast Given 06/19/23 2131)  HYDROmorphone (DILAUDID) injection 0.5 mg (0.5 mg Intravenous Given 06/19/23 2354)  ondansetron (ZOFRAN) injection 4 mg (4 mg Intravenous Given 06/19/23 2354)    ED Course/ Medical Decision Making/ A&P                                 Medical Decision Making This patient presents to the ED for concern of flank and abdominal pain, this  involves an extensive number  of treatment options, and is a complaint that carries with it a high risk of complications and morbidity.  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 He had a CTA abd/pel on 3/29 Randie Heinz) -    Lab Tests:  I Ordered, and personally interpreted labs.  The pertinent results include:  UA - no hematura, no infection, >1000 glucose; No leukocytosis, , Hgb 9.8 (c/w past values); no electrolyte abnormalities, normal renal function    Imaging Studies ordered:  I ordered imaging studies including CTA abd/pel w/ and w/o CM Per radiologist interpretation:   IMPRESSION: VASCULAR   1. 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 2. 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   NON-VASCULAR   1. Interim development of multiple fluid-filled mildly dilated loops of small bowel, without well-defined transition point suggestive of mild ileus or partial/developing bowel obstruction. 2. Enlarged prostate   Cardiac Monitoring:  The patient was maintained on a cardiac monitor.  I personally viewed and interpreted the cardiac monitored which showed an underlying rhythm of: n/a   Medicines ordered and prescription drug management:  I ordered medication including Dilaudid  for pain Reevaluation of the  patient after these medicines showed that the patient improved I have reviewed the patients home medicines and have made adjustments as needed   Test Considered:  N/a   Critical Interventions:  N/a   Consultations Obtained:  I requested consultation with the Dr. Fredricka Bonine, gen surg,  and discussed lab and imaging findings as well as pertinent plan - they recommend: they will provide consult while inpatient. 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 Per radiology, there is concern for a developing/early SBO with air/fluid levels present, no clear transition point. This was discussed with Dr. Fredricka Bonine, gen surg, who will provide consultation while inpatient.   Hospitalist paged for admission.   Reevaluation:  After the interventions noted above, I reevaluated the patient and found that they have :worsened   Social Determinants of Health:  Lives at home with wife   Disposition:  After consideration of the diagnostic results and the patients response to treatment, I feel that the patient would benefit from admit.   Amount and/or Complexity of Data Reviewed Labs: ordered. Radiology: ordered.  Risk Prescription drug management.           Final Clinical Impression(s) / ED Diagnoses Final diagnoses:  SBO (small bowel obstruction) [facility])    Rx / DC Orders ED Discharge Orders     None         Elpidio Anis, PA-C 06/19/23 2355    Vanetta Mulders, MD 06/23/23 1742

## 2023-06-19 NOTE — ED Triage Notes (Signed)
 Pt via pov from home with left flank pain that has been going on intermittently for a few months. He reports that the pain is significantly worse today. Pt denies any urinary symptoms including frequency, pain, hematuria. Pt alert & oriented, nad noted.

## 2023-06-19 NOTE — Progress Notes (Signed)
 Plan of Care Note for accepted transfer   Patient: Erik [name]. MRN: [id number]   [name]: 06/19/2023  Facility requesting transfer: [facility]   Requesting Provider: Elpidio Anis, PA   Reason for transfer: Ileus vs partial or developing SBO   Facility course: 83 yr old man with HTn, PAD, CAD, CHF, and COPD presents with left flank and abdominal pain.   Pain had been intermittent but worse today and associated with nausea but no vomiting. He has not been passing flatus per ED PA.   He is afebrile with stable vitals. Labs appear stable. CT findings are suggestive of ileus vs partial or developing SBO.   Surgery (Dr. Fredricka Bonine) was consulted by ED PA and the patient was treated with Dilaudid and Zofran.   Plan of care: The patient is accepted for admission to Med-surg  unit, at [facility].   Author: Briscoe Deutscher, MD 06/19/2023  Check [email or web address] for on-call coverage.  Nursing staff, Please call [facility] Admits & Consults System-Wide number on Amion as soon as patient's arrival, so appropriate admitting provider can evaluate the pt.

## 2023-06-19 NOTE — ED Notes (Signed)
 Pt placed on 2lt [place] due to SATS of 87% while sleeping.

## 2023-06-20 ENCOUNTER — Encounter (HOSPITAL_COMMUNITY)
Admission: EM | Disposition: A | Discharge status: Hospice | Payer: Self-pay | Source: Home / Self Care | Attending: Pulmonary Disease

## 2023-06-20 ENCOUNTER — Inpatient Hospital Stay (HOSPITAL_COMMUNITY)

## 2023-06-20 ENCOUNTER — Inpatient Hospital Stay (HOSPITAL_COMMUNITY): Admitting: Anesthesiology

## 2023-06-20 ENCOUNTER — Other Ambulatory Visit: Payer: Self-pay

## 2023-06-20 ENCOUNTER — Encounter (HOSPITAL_COMMUNITY): Payer: Self-pay | Admitting: Family Medicine

## 2023-06-20 ENCOUNTER — Observation Stay (HOSPITAL_COMMUNITY)

## 2023-06-20 ENCOUNTER — Inpatient Hospital Stay (HOSPITAL_COMMUNITY): Admitting: Registered Nurse

## 2023-06-20 DIAGNOSIS — Z9049 Acquired absence of other specified parts of digestive tract: Secondary | ICD-10-CM | POA: Diagnosis not present

## 2023-06-20 DIAGNOSIS — S36408A Unspecified injury of other part of small intestine, initial encounter: Secondary | ICD-10-CM | POA: Diagnosis not present

## 2023-06-20 DIAGNOSIS — Z66 Do not resuscitate: Secondary | ICD-10-CM | POA: Diagnosis present

## 2023-06-20 DIAGNOSIS — Z515 Encounter for palliative care: Secondary | ICD-10-CM | POA: Diagnosis not present

## 2023-06-20 DIAGNOSIS — J449 Chronic obstructive pulmonary disease, unspecified: Secondary | ICD-10-CM | POA: Diagnosis present

## 2023-06-20 DIAGNOSIS — R109 Unspecified abdominal pain: Secondary | ICD-10-CM

## 2023-06-20 DIAGNOSIS — R509 Fever, unspecified: Secondary | ICD-10-CM | POA: Diagnosis not present

## 2023-06-20 DIAGNOSIS — K5530 Necrotizing enterocolitis, unspecified: Secondary | ICD-10-CM | POA: Diagnosis not present

## 2023-06-20 DIAGNOSIS — K5900 Constipation, unspecified: Secondary | ICD-10-CM | POA: Diagnosis not present

## 2023-06-20 DIAGNOSIS — R100 Acute abdomen: Secondary | ICD-10-CM | POA: Diagnosis not present

## 2023-06-20 DIAGNOSIS — B9562 Methicillin resistant Staphylococcus aureus infection as the cause of diseases classified elsewhere: Secondary | ICD-10-CM | POA: Diagnosis not present

## 2023-06-20 DIAGNOSIS — Z720 Tobacco use: Secondary | ICD-10-CM | POA: Diagnosis not present

## 2023-06-20 DIAGNOSIS — R7402 Elevation of levels of lactic acid dehydrogenase (LDH): Secondary | ICD-10-CM

## 2023-06-20 DIAGNOSIS — K660 Peritoneal adhesions (postprocedural) (postinfection): Secondary | ICD-10-CM | POA: Diagnosis not present

## 2023-06-20 DIAGNOSIS — I714 Abdominal aortic aneurysm, without rupture, unspecified: Secondary | ICD-10-CM | POA: Diagnosis not present

## 2023-06-20 DIAGNOSIS — I7143 Infrarenal abdominal aortic aneurysm, without rupture: Secondary | ICD-10-CM | POA: Diagnosis present

## 2023-06-20 DIAGNOSIS — N400 Enlarged prostate without lower urinary tract symptoms: Secondary | ICD-10-CM | POA: Diagnosis not present

## 2023-06-20 DIAGNOSIS — A419 Sepsis, unspecified organism: Secondary | ICD-10-CM | POA: Diagnosis not present

## 2023-06-20 DIAGNOSIS — J9601 Acute respiratory failure with hypoxia: Secondary | ICD-10-CM | POA: Diagnosis not present

## 2023-06-20 DIAGNOSIS — Z95828 Presence of other vascular implants and grafts: Secondary | ICD-10-CM

## 2023-06-20 DIAGNOSIS — A499 Bacterial infection, unspecified: Secondary | ICD-10-CM | POA: Diagnosis not present

## 2023-06-20 DIAGNOSIS — E785 Hyperlipidemia, unspecified: Secondary | ICD-10-CM | POA: Diagnosis not present

## 2023-06-20 DIAGNOSIS — B9620 Unspecified Escherichia coli [E. coli] as the cause of diseases classified elsewhere: Secondary | ICD-10-CM | POA: Diagnosis not present

## 2023-06-20 DIAGNOSIS — K56609 Unspecified intestinal obstruction, unspecified as to partial versus complete obstruction: Principal | ICD-10-CM | POA: Diagnosis present

## 2023-06-20 DIAGNOSIS — I509 Heart failure, unspecified: Secondary | ICD-10-CM | POA: Diagnosis not present

## 2023-06-20 DIAGNOSIS — R1312 Dysphagia, oropharyngeal phase: Secondary | ICD-10-CM | POA: Diagnosis not present

## 2023-06-20 DIAGNOSIS — D62 Acute posthemorrhagic anemia: Secondary | ICD-10-CM | POA: Diagnosis not present

## 2023-06-20 DIAGNOSIS — I2510 Atherosclerotic heart disease of native coronary artery without angina pectoris: Secondary | ICD-10-CM | POA: Diagnosis not present

## 2023-06-20 DIAGNOSIS — J439 Emphysema, unspecified: Secondary | ICD-10-CM | POA: Diagnosis not present

## 2023-06-20 DIAGNOSIS — E43 Unspecified severe protein-calorie malnutrition: Secondary | ICD-10-CM | POA: Diagnosis not present

## 2023-06-20 DIAGNOSIS — A4902 Methicillin resistant Staphylococcus aureus infection, unspecified site: Secondary | ICD-10-CM | POA: Diagnosis not present

## 2023-06-20 DIAGNOSIS — R0602 Shortness of breath: Secondary | ICD-10-CM | POA: Diagnosis not present

## 2023-06-20 DIAGNOSIS — E8721 Acute metabolic acidosis: Secondary | ICD-10-CM | POA: Diagnosis not present

## 2023-06-20 DIAGNOSIS — R7881 Bacteremia: Secondary | ICD-10-CM | POA: Diagnosis not present

## 2023-06-20 DIAGNOSIS — K658 Other peritonitis: Secondary | ICD-10-CM | POA: Diagnosis not present

## 2023-06-20 DIAGNOSIS — R1110 Vomiting, unspecified: Secondary | ICD-10-CM | POA: Diagnosis not present

## 2023-06-20 DIAGNOSIS — I700 Atherosclerosis of aorta: Secondary | ICD-10-CM | POA: Diagnosis not present

## 2023-06-20 DIAGNOSIS — K55059 Acute (reversible) ischemia of intestine, part and extent unspecified: Secondary | ICD-10-CM

## 2023-06-20 DIAGNOSIS — K5651 Intestinal adhesions [bands], with partial obstruction: Secondary | ICD-10-CM | POA: Diagnosis present

## 2023-06-20 DIAGNOSIS — D72829 Elevated white blood cell count, unspecified: Secondary | ICD-10-CM

## 2023-06-20 DIAGNOSIS — Z9861 Coronary angioplasty status: Secondary | ICD-10-CM | POA: Diagnosis not present

## 2023-06-20 DIAGNOSIS — T80219D Unspecified infection due to central venous catheter, subsequent encounter: Secondary | ICD-10-CM | POA: Diagnosis not present

## 2023-06-20 DIAGNOSIS — F17210 Nicotine dependence, cigarettes, uncomplicated: Secondary | ICD-10-CM | POA: Diagnosis not present

## 2023-06-20 DIAGNOSIS — J690 Pneumonitis due to inhalation of food and vomit: Secondary | ICD-10-CM | POA: Diagnosis not present

## 2023-06-20 DIAGNOSIS — Y848 Other medical procedures as the cause of abnormal reaction of the patient, or of later complication, without mention of misadventure at the time of the procedure: Secondary | ICD-10-CM | POA: Diagnosis present

## 2023-06-20 DIAGNOSIS — R579 Shock, unspecified: Secondary | ICD-10-CM | POA: Diagnosis not present

## 2023-06-20 DIAGNOSIS — R918 Other nonspecific abnormal finding of lung field: Secondary | ICD-10-CM | POA: Diagnosis not present

## 2023-06-20 DIAGNOSIS — J9690 Respiratory failure, unspecified, unspecified whether with hypoxia or hypercapnia: Secondary | ICD-10-CM | POA: Diagnosis not present

## 2023-06-20 DIAGNOSIS — I5022 Chronic systolic (congestive) heart failure: Secondary | ICD-10-CM | POA: Diagnosis present

## 2023-06-20 DIAGNOSIS — J929 Pleural plaque without asbestos: Secondary | ICD-10-CM | POA: Diagnosis not present

## 2023-06-20 DIAGNOSIS — K55039 Acute (reversible) ischemia of large intestine, extent unspecified: Secondary | ICD-10-CM | POA: Diagnosis not present

## 2023-06-20 DIAGNOSIS — I38 Endocarditis, valve unspecified: Secondary | ICD-10-CM | POA: Diagnosis not present

## 2023-06-20 DIAGNOSIS — N138 Other obstructive and reflux uropathy: Secondary | ICD-10-CM | POA: Diagnosis present

## 2023-06-20 DIAGNOSIS — I5020 Unspecified systolic (congestive) heart failure: Secondary | ICD-10-CM | POA: Diagnosis not present

## 2023-06-20 DIAGNOSIS — E870 Hyperosmolality and hypernatremia: Secondary | ICD-10-CM | POA: Diagnosis present

## 2023-06-20 DIAGNOSIS — Z7189 Other specified counseling: Secondary | ICD-10-CM | POA: Diagnosis not present

## 2023-06-20 DIAGNOSIS — J984 Other disorders of lung: Secondary | ICD-10-CM | POA: Diagnosis not present

## 2023-06-20 DIAGNOSIS — R0989 Other specified symptoms and signs involving the circulatory and respiratory systems: Secondary | ICD-10-CM | POA: Diagnosis not present

## 2023-06-20 DIAGNOSIS — R0902 Hypoxemia: Secondary | ICD-10-CM | POA: Diagnosis not present

## 2023-06-20 DIAGNOSIS — B9561 Methicillin susceptible Staphylococcus aureus infection as the cause of diseases classified elsewhere: Secondary | ICD-10-CM | POA: Diagnosis not present

## 2023-06-20 DIAGNOSIS — R0600 Dyspnea, unspecified: Secondary | ICD-10-CM | POA: Diagnosis not present

## 2023-06-20 DIAGNOSIS — G35 Multiple sclerosis: Secondary | ICD-10-CM | POA: Diagnosis present

## 2023-06-20 DIAGNOSIS — K449 Diaphragmatic hernia without obstruction or gangrene: Secondary | ICD-10-CM | POA: Diagnosis not present

## 2023-06-20 DIAGNOSIS — D696 Thrombocytopenia, unspecified: Secondary | ICD-10-CM | POA: Diagnosis not present

## 2023-06-20 DIAGNOSIS — K551 Chronic vascular disorders of intestine: Secondary | ICD-10-CM

## 2023-06-20 DIAGNOSIS — G2581 Restless legs syndrome: Secondary | ICD-10-CM | POA: Diagnosis present

## 2023-06-20 DIAGNOSIS — R1084 Generalized abdominal pain: Secondary | ICD-10-CM | POA: Diagnosis not present

## 2023-06-20 DIAGNOSIS — I110 Hypertensive heart disease with heart failure: Secondary | ICD-10-CM | POA: Diagnosis present

## 2023-06-20 DIAGNOSIS — T80219A Unspecified infection due to central venous catheter, initial encounter: Secondary | ICD-10-CM | POA: Diagnosis present

## 2023-06-20 DIAGNOSIS — R571 Hypovolemic shock: Secondary | ICD-10-CM | POA: Diagnosis not present

## 2023-06-20 DIAGNOSIS — R6521 Severe sepsis with septic shock: Secondary | ICD-10-CM | POA: Diagnosis not present

## 2023-06-20 DIAGNOSIS — Z95820 Peripheral vascular angioplasty status with implants and grafts: Secondary | ICD-10-CM

## 2023-06-20 DIAGNOSIS — J811 Chronic pulmonary edema: Secondary | ICD-10-CM | POA: Diagnosis not present

## 2023-06-20 DIAGNOSIS — S36898A Other injury of other intra-abdominal organs, initial encounter: Secondary | ICD-10-CM | POA: Diagnosis not present

## 2023-06-20 DIAGNOSIS — Z9889 Other specified postprocedural states: Secondary | ICD-10-CM

## 2023-06-20 DIAGNOSIS — Z452 Encounter for adjustment and management of vascular access device: Secondary | ICD-10-CM | POA: Diagnosis not present

## 2023-06-20 DIAGNOSIS — K529 Noninfective gastroenteritis and colitis, unspecified: Secondary | ICD-10-CM | POA: Diagnosis not present

## 2023-06-20 DIAGNOSIS — N179 Acute kidney failure, unspecified: Secondary | ICD-10-CM | POA: Diagnosis not present

## 2023-06-20 DIAGNOSIS — D649 Anemia, unspecified: Secondary | ICD-10-CM | POA: Diagnosis present

## 2023-06-20 DIAGNOSIS — K659 Peritonitis, unspecified: Secondary | ICD-10-CM | POA: Diagnosis present

## 2023-06-20 DIAGNOSIS — T827XXA Infection and inflammatory reaction due to other cardiac and vascular devices, implants and grafts, initial encounter: Secondary | ICD-10-CM | POA: Diagnosis not present

## 2023-06-20 DIAGNOSIS — S35228A Other injury of superior mesenteric artery, initial encounter: Secondary | ICD-10-CM | POA: Diagnosis not present

## 2023-06-20 DIAGNOSIS — I10 Essential (primary) hypertension: Secondary | ICD-10-CM | POA: Diagnosis not present

## 2023-06-20 DIAGNOSIS — Y92239 Unspecified place in hospital as the place of occurrence of the external cause: Secondary | ICD-10-CM | POA: Diagnosis not present

## 2023-06-20 DIAGNOSIS — Z4682 Encounter for fitting and adjustment of non-vascular catheter: Secondary | ICD-10-CM | POA: Diagnosis not present

## 2023-06-20 DIAGNOSIS — I6523 Occlusion and stenosis of bilateral carotid arteries: Secondary | ICD-10-CM | POA: Diagnosis present

## 2023-06-20 DIAGNOSIS — Y832 Surgical operation with anastomosis, bypass or graft as the cause of abnormal reaction of the patient, or of later complication, without mention of misadventure at the time of the procedure: Secondary | ICD-10-CM | POA: Diagnosis not present

## 2023-06-20 DIAGNOSIS — K55049 Acute infarction of large intestine, extent unspecified: Secondary | ICD-10-CM

## 2023-06-20 DIAGNOSIS — J90 Pleural effusion, not elsewhere classified: Secondary | ICD-10-CM | POA: Diagnosis not present

## 2023-06-20 DIAGNOSIS — I708 Atherosclerosis of other arteries: Secondary | ICD-10-CM

## 2023-06-20 HISTORY — PX: INSERTION OF ILIAC STENT: SHX6256

## 2023-06-20 HISTORY — PX: AORTOGRAM: SHX6300

## 2023-06-20 HISTORY — PX: LAPAROSCOPY: SHX197

## 2023-06-20 LAB — HEPATIC FUNCTION PANEL
ALT: 14 U/L (ref 0–44)
AST: 35 U/L (ref 15–41)
Albumin: 3.5 g/dL (ref 3.5–5.0)
Alkaline Phosphatase: 78 U/L (ref 38–126)
Bilirubin, Direct: 0.1 mg/dL (ref 0.0–0.2)
Indirect Bilirubin: 0.5 mg/dL (ref 0.3–0.9)
Total Bilirubin: 0.6 mg/dL (ref 0.0–1.2)
Total Protein: 8.4 g/dL — ABNORMAL HIGH (ref 6.5–8.1)

## 2023-06-20 LAB — LACTIC ACID, PLASMA
Lactic Acid, Venous: 6.2 mmol/L (ref 0.5–1.9)
Lactic Acid, Venous: 4.8 mmol/L (ref 0.5–1.9)
Lactic Acid, Venous: 7.4 mmol/L (ref 0.5–1.9)
Lactic Acid, Venous: 5.2 mmol/L (ref 0.5–1.9)
Lactic Acid, Venous: 2.3 mmol/L (ref 0.5–1.9)

## 2023-06-20 LAB — RETICULOCYTES
Immature Retic Fract: 26.1 % — ABNORMAL HIGH (ref 2.3–15.9)
RBC.: 4.02 MIL/uL — ABNORMAL LOW (ref 4.22–5.81)
Retic Count, Absolute: 92.1 10*3/uL (ref 19.0–186.0)
Retic Ct Pct: 2.3 % (ref 0.4–3.1)

## 2023-06-20 LAB — CBC
HCT: 36.7 % — ABNORMAL LOW (ref 39.0–52.0)
Hemoglobin: 11.4 g/dL — ABNORMAL LOW (ref 13.0–17.0)
MCH: 27.8 pg (ref 26.0–34.0)
MCHC: 31.1 g/dL (ref 30.0–36.0)
MCV: 89.5 fL (ref 80.0–100.0)
Platelets: 222 10*3/uL (ref 150–400)
RBC: 4.1 MIL/uL — ABNORMAL LOW (ref 4.22–5.81)
RDW: 15.5 % (ref 11.5–15.5)
WBC: 10.8 10*3/uL — ABNORMAL HIGH (ref 4.0–10.5)
nRBC: 0 % (ref 0.0–0.2)

## 2023-06-20 LAB — FERRITIN: Ferritin: 54 ng/mL (ref 24–336)

## 2023-06-20 LAB — BASIC METABOLIC PANEL WITH GFR
Anion gap: 16 — ABNORMAL HIGH (ref 5–15)
BUN: 28 mg/dL — ABNORMAL HIGH (ref 8–23)
CO2: 20 mmol/L — ABNORMAL LOW (ref 22–32)
Calcium: 9 mg/dL (ref 8.9–10.3)
Chloride: 101 mmol/L (ref 98–111)
Creatinine, Ser: 1.27 mg/dL — ABNORMAL HIGH (ref 0.61–1.24)
GFR, Estimated: 56 mL/min — ABNORMAL LOW (ref >60)
Glucose, Bld: 168 mg/dL — ABNORMAL HIGH (ref 70–99)
Potassium: 3.9 mmol/L (ref 3.5–5.1)
Sodium: 137 mmol/L (ref 135–145)

## 2023-06-20 LAB — CBC WITH DIFFERENTIAL/PLATELET
Abs Immature Granulocytes: 0.09 10*3/uL — ABNORMAL HIGH (ref 0.00–0.07)
Basophils Absolute: 0.1 10*3/uL (ref 0.0–0.1)
Basophils Relative: 0 %
Eosinophils Absolute: 0 10*3/uL (ref 0.0–0.5)
Eosinophils Relative: 0 %
HCT: 36.8 % — ABNORMAL LOW (ref 39.0–52.0)
Hemoglobin: 10.9 g/dL — ABNORMAL LOW (ref 13.0–17.0)
Immature Granulocytes: 0 %
Lymphocytes Relative: 2 %
Lymphs Abs: 0.3 10*3/uL — ABNORMAL LOW (ref 0.7–4.0)
MCH: 27.1 pg (ref 26.0–34.0)
MCHC: 29.6 g/dL — ABNORMAL LOW (ref 30.0–36.0)
MCV: 91.5 fL (ref 80.0–100.0)
Monocytes Absolute: 1.3 10*3/uL — ABNORMAL HIGH (ref 0.1–1.0)
Monocytes Relative: 6 %
Neutro Abs: 18.5 10*3/uL — ABNORMAL HIGH (ref 1.7–7.7)
Neutrophils Relative %: 92 %
Platelets: 363 10*3/uL (ref 150–400)
RBC: 4.02 MIL/uL — ABNORMAL LOW (ref 4.22–5.81)
RDW: 15.9 % — ABNORMAL HIGH (ref 11.5–15.5)
WBC: 20.2 10*3/uL — ABNORMAL HIGH (ref 4.0–10.5)
nRBC: 0 % (ref 0.0–0.2)

## 2023-06-20 LAB — IRON AND TIBC
Iron: 74 ug/dL (ref 45–182)
Saturation Ratios: 19 % (ref 17.9–39.5)
TIBC: 396 ug/dL (ref 250–450)
UIBC: 322 ug/dL

## 2023-06-20 LAB — CREATININE, SERUM
Creatinine, Ser: 1.18 mg/dL (ref 0.61–1.24)
GFR, Estimated: >60 mL/min (ref >60)

## 2023-06-20 LAB — BLOOD GAS, ARTERIAL
Acid-base deficit: 9.8 mmol/L — ABNORMAL HIGH (ref 0.0–2.0)
Bicarbonate: 19 mmol/L — ABNORMAL LOW (ref 20.0–28.0)
Drawn by: 38235
O2 Saturation: 96.6 %
Patient temperature: 36.6
pCO2 arterial: 51 mmHg — ABNORMAL HIGH (ref 32–48)
pH, Arterial: 7.18 — CL (ref 7.35–7.45)
pO2, Arterial: 74 mmHg — ABNORMAL LOW (ref 83–108)

## 2023-06-20 LAB — GLUCOSE, CAPILLARY
Glucose-Capillary: 124 mg/dL — ABNORMAL HIGH (ref 70–99)
Glucose-Capillary: 143 mg/dL — ABNORMAL HIGH (ref 70–99)

## 2023-06-20 LAB — TYPE AND SCREEN
ABO/RH(D): A POS
Antibody Screen: NEGATIVE

## 2023-06-20 LAB — FOLATE: Folate: 15.3 ng/mL (ref >5.9)

## 2023-06-20 LAB — VITAMIN B12: Vitamin B-12: 7123 pg/mL — ABNORMAL HIGH (ref 180–914)

## 2023-06-20 LAB — PREPARE RBC (CROSSMATCH)

## 2023-06-20 SURGERY — LAPAROSCOPY, DIAGNOSTIC
Anesthesia: General

## 2023-06-20 SURGERY — LAPAROSCOPY, DIAGNOSTIC
Anesthesia: General | Site: Abdomen

## 2023-06-20 MED ORDER — HYDROMORPHONE HCL 1 MG/ML IJ SOLN
0.2000 mg | INTRAMUSCULAR | Status: DC | PRN
  Administered 2023-06-20 (×2): 0.2 mg via INTRAVENOUS
  Filled 2023-06-20 (×2): qty 0.5

## 2023-06-20 MED ORDER — IODIXANOL 320 MG/ML IV SOLN
INTRAVENOUS | Status: DC | PRN
  Administered 2023-06-20: 140 mL

## 2023-06-20 MED ORDER — DEXAMETHASONE SODIUM PHOSPHATE 10 MG/ML IJ SOLN
INTRAMUSCULAR | Status: AC
  Filled 2023-06-20: qty 1

## 2023-06-20 MED ORDER — ROCURONIUM BROMIDE 10 MG/ML (PF) SYRINGE
PREFILLED_SYRINGE | INTRAVENOUS | Status: AC
  Filled 2023-06-20: qty 10

## 2023-06-20 MED ORDER — 0.9 % SODIUM CHLORIDE (POUR BTL) OPTIME
TOPICAL | Status: DC | PRN
  Administered 2023-06-20: 1000 mL

## 2023-06-20 MED ORDER — HYDROMORPHONE HCL 1 MG/ML IJ SOLN
0.5000 mg | Freq: Once | INTRAMUSCULAR | Status: AC
  Administered 2023-06-20: 0.5 mg via INTRAVENOUS
  Filled 2023-06-20: qty 0.5

## 2023-06-20 MED ORDER — ONDANSETRON HCL 4 MG/2ML IJ SOLN
INTRAMUSCULAR | Status: AC
  Filled 2023-06-20: qty 2

## 2023-06-20 MED ORDER — SODIUM CHLORIDE 0.9 % IV SOLN: INTRAVENOUS | Status: DC

## 2023-06-20 MED ORDER — PROPOFOL 10 MG/ML IV BOLUS
INTRAVENOUS | Status: AC
Start: 2023-06-20 — End: ?
  Filled 2023-06-20: qty 20

## 2023-06-20 MED ORDER — DIATRIZOATE MEGLUMINE & SODIUM 66-10 % PO SOLN
90.0000 mL | Freq: Once | ORAL | Status: AC
  Administered 2023-06-20: 90 mL via ORAL
  Filled 2023-06-20: qty 90

## 2023-06-20 MED ORDER — SODIUM CHLORIDE 0.9% FLUSH
3.0000 mL | Freq: Two times a day (BID) | INTRAVENOUS | Status: DC
  Administered 2023-06-21 – 2023-06-27 (×11): 3 mL via INTRAVENOUS

## 2023-06-20 MED ORDER — CHLORHEXIDINE GLUCONATE CLOTH 2 % EX PADS
6.0000 | MEDICATED_PAD | Freq: Every day | CUTANEOUS | Status: DC
  Administered 2023-06-20 – 2023-06-25 (×6): 6 via TOPICAL

## 2023-06-20 MED ORDER — ROCURONIUM BROMIDE 10 MG/ML (PF) SYRINGE
PREFILLED_SYRINGE | INTRAVENOUS | Status: DC | PRN
  Administered 2023-06-20 (×2): 40 mg via INTRAVENOUS
  Administered 2023-06-20: 20 mg via INTRAVENOUS

## 2023-06-20 MED ORDER — BUPIVACAINE-EPINEPHRINE (PF) 0.25% -1:200000 IJ SOLN
INTRAMUSCULAR | Status: AC
  Filled 2023-06-20: qty 30

## 2023-06-20 MED ORDER — SODIUM CHLORIDE 0.9 % IV SOLN: INTRAVENOUS | Status: DC | PRN

## 2023-06-20 MED ORDER — PROTAMINE SULFATE 10 MG/ML IV SOLN
INTRAVENOUS | Status: AC
  Filled 2023-06-20: qty 5

## 2023-06-20 MED ORDER — DEXTROSE IN LACTATED RINGERS 5 % IV SOLN: INTRAVENOUS | Status: DC

## 2023-06-20 MED ORDER — SODIUM CHLORIDE 0.9 % WEIGHT BASED INFUSION
1.0000 mL/kg/h | INTRAVENOUS | Status: AC
  Administered 2023-06-20: 1 mL/kg/h via INTRAVENOUS

## 2023-06-20 MED ORDER — ARFORMOTEROL TARTRATE 15 MCG/2ML IN NEBU
15.0000 ug | INHALATION_SOLUTION | Freq: Two times a day (BID) | RESPIRATORY_TRACT | Status: DC
  Administered 2023-06-20 – 2023-07-03 (×25): 15 ug via RESPIRATORY_TRACT
  Filled 2023-06-20 (×23): qty 2

## 2023-06-20 MED ORDER — SODIUM CHLORIDE 0.9 % IV SOLN
INTRAVENOUS | Status: AC
Start: 2023-06-20 — End: 2023-06-21

## 2023-06-20 MED ORDER — PROTAMINE SULFATE 10 MG/ML IV SOLN
INTRAVENOUS | Status: DC | PRN
  Administered 2023-06-20: 20 mg via INTRAVENOUS

## 2023-06-20 MED ORDER — REVEFENACIN 175 MCG/3ML IN SOLN
175.0000 ug | Freq: Every day | RESPIRATORY_TRACT | Status: DC
  Administered 2023-06-20 – 2023-07-03 (×12): 175 ug via RESPIRATORY_TRACT
  Filled 2023-06-20 (×13): qty 3

## 2023-06-20 MED ORDER — SODIUM CHLORIDE 0.9% IV SOLUTION: Freq: Once | INTRAVENOUS | Status: DC

## 2023-06-20 MED ORDER — SUCCINYLCHOLINE CHLORIDE 200 MG/10ML IV SOSY
PREFILLED_SYRINGE | INTRAVENOUS | Status: DC | PRN
  Administered 2023-06-20: 80 mg via INTRAVENOUS

## 2023-06-20 MED ORDER — HYDRALAZINE HCL 20 MG/ML IJ SOLN: 5.0000 mg | INTRAMUSCULAR | Status: DC | PRN

## 2023-06-20 MED ORDER — SODIUM CHLORIDE 0.9 % IV BOLUS
1000.0000 mL | Freq: Once | INTRAVENOUS | Status: AC
  Administered 2023-06-20: 1000 mL via INTRAVENOUS

## 2023-06-20 MED ORDER — FENTANYL CITRATE (PF) 250 MCG/5ML IJ SOLN
INTRAMUSCULAR | Status: DC | PRN
  Administered 2023-06-20: 50 ug via INTRAVENOUS

## 2023-06-20 MED ORDER — ACETAMINOPHEN 325 MG PO TABS
650.0000 mg | ORAL_TABLET | ORAL | Status: DC | PRN
  Administered 2023-06-28: 650 mg
  Filled 2023-06-20: qty 2

## 2023-06-20 MED ORDER — ONDANSETRON HCL 4 MG/2ML IJ SOLN
4.0000 mg | Freq: Four times a day (QID) | INTRAMUSCULAR | Status: DC | PRN
  Administered 2023-06-20: 4 mg via INTRAVENOUS
  Filled 2023-06-20: qty 2

## 2023-06-20 MED ORDER — DEXAMETHASONE SODIUM PHOSPHATE 10 MG/ML IJ SOLN
INTRAMUSCULAR | Status: DC | PRN
Start: 2023-06-20 — End: 2023-06-20
  Administered 2023-06-20: 10 mg via INTRAVENOUS

## 2023-06-20 MED ORDER — HEPARIN SODIUM (PORCINE) 5000 UNIT/ML IJ SOLN
5000.0000 [IU] | Freq: Three times a day (TID) | INTRAMUSCULAR | Status: DC
  Filled 2023-06-20: qty 1

## 2023-06-20 MED ORDER — CLOPIDOGREL BISULFATE 75 MG PO TABS
75.0000 mg | ORAL_TABLET | Freq: Every day | ORAL | Status: DC
  Administered 2023-06-21 – 2023-06-25 (×4): 75 mg
  Filled 2023-06-20 (×4): qty 1

## 2023-06-20 MED ORDER — PROPOFOL 1000 MG/100ML IV EMUL
INTRAVENOUS | Status: AC
  Filled 2023-06-20: qty 100

## 2023-06-20 MED ORDER — PROPOFOL 1000 MG/100ML IV EMUL
0.0000 ug/kg/min | INTRAVENOUS | Status: DC
  Administered 2023-06-20 – 2023-06-21 (×2): 20 ug/kg/min via INTRAVENOUS
  Filled 2023-06-20: qty 100

## 2023-06-20 MED ORDER — LABETALOL HCL 5 MG/ML IV SOLN
5.0000 mg | INTRAVENOUS | Status: DC
  Administered 2023-06-22 – 2023-06-26 (×10): 5 mg via INTRAVENOUS
  Filled 2023-06-20 (×15): qty 4

## 2023-06-20 MED ORDER — PIPERACILLIN-TAZOBACTAM 3.375 G IVPB
3.3750 g | Freq: Three times a day (TID) | INTRAVENOUS | Status: DC
  Administered 2023-06-20 – 2023-06-24 (×12): 3.375 g via INTRAVENOUS
  Filled 2023-06-20 (×12): qty 50

## 2023-06-20 MED ORDER — CALCIUM CHLORIDE 10 % IV SOLN
INTRAVENOUS | Status: DC | PRN
Start: 2023-06-20 — End: 2023-06-20
  Administered 2023-06-20 (×2): 1 g via INTRAVENOUS

## 2023-06-20 MED ORDER — SODIUM CHLORIDE 0.9 % IV BOLUS
1000.0000 mL | Freq: Once | INTRAVENOUS | Status: AC
Start: 2023-06-20 — End: 2023-06-20
  Administered 2023-06-20: 1000 mL via INTRAVENOUS

## 2023-06-20 MED ORDER — FENTANYL CITRATE (PF) 100 MCG/2ML IJ SOLN
INTRAMUSCULAR | Status: AC
  Filled 2023-06-20: qty 2

## 2023-06-20 MED ORDER — PROPOFOL 10 MG/ML IV BOLUS
INTRAVENOUS | Status: AC
  Filled 2023-06-20: qty 20

## 2023-06-20 MED ORDER — ASPIRIN 81 MG PO CHEW: 81.0000 mg | CHEWABLE_TABLET | Freq: Every day | ORAL | Status: DC

## 2023-06-20 MED ORDER — SODIUM CHLORIDE 0.9 % IV SOLN
INTRAVENOUS | Status: DC | PRN
Start: 2023-06-20 — End: 2023-06-20

## 2023-06-20 MED ORDER — CLOPIDOGREL BISULFATE 75 MG PO TABS
300.0000 mg | ORAL_TABLET | Freq: Once | ORAL | Status: AC
  Administered 2023-06-20: 300 mg
  Filled 2023-06-20: qty 4

## 2023-06-20 MED ORDER — CALCIUM CHLORIDE 10 % IV SOLN
INTRAVENOUS | Status: AC
  Filled 2023-06-20: qty 10

## 2023-06-20 MED ORDER — ACETAMINOPHEN 10 MG/ML IV SOLN
INTRAVENOUS | Status: AC
  Filled 2023-06-20: qty 100

## 2023-06-20 MED ORDER — PANTOPRAZOLE SODIUM 40 MG IV SOLR
40.0000 mg | INTRAVENOUS | Status: DC
  Administered 2023-06-20: 40 mg via INTRAVENOUS
  Filled 2023-06-20: qty 10

## 2023-06-20 MED ORDER — HYDROMORPHONE HCL 1 MG/ML IJ SOLN
0.5000 mg | INTRAMUSCULAR | Status: DC | PRN
Start: 2023-06-20 — End: 2023-06-20
  Administered 2023-06-20: 1 mg via INTRAVENOUS
  Administered 2023-06-20: 0.5 mg via INTRAVENOUS
  Filled 2023-06-20 (×2): qty 1

## 2023-06-20 MED ORDER — LABETALOL HCL 5 MG/ML IV SOLN
10.0000 mg | INTRAVENOUS | Status: DC | PRN
  Administered 2023-06-22 (×2): 10 mg via INTRAVENOUS
  Filled 2023-06-20 (×2): qty 4

## 2023-06-20 MED ORDER — ASPIRIN 300 MG RE SUPP
300.0000 mg | Freq: Once | RECTAL | Status: AC
  Administered 2023-06-20: 300 mg via RECTAL
  Filled 2023-06-20: qty 1

## 2023-06-20 MED ORDER — HYDROMORPHONE HCL 1 MG/ML IJ SOLN
0.5000 mg | INTRAMUSCULAR | Status: DC | PRN
  Administered 2023-06-21 (×2): 1 mg via INTRAVENOUS
  Filled 2023-06-20 (×2): qty 1

## 2023-06-20 MED ORDER — HYDROMORPHONE HCL 1 MG/ML IJ SOLN
0.5000 mg | INTRAMUSCULAR | Status: AC | PRN
  Administered 2023-06-20 – 2023-06-21 (×3): 0.5 mg via INTRAVENOUS
  Filled 2023-06-20 (×3): qty 0.5

## 2023-06-20 MED ORDER — SODIUM CHLORIDE 0.9 % IV SOLN: 250.0000 mL | INTRAVENOUS | Status: AC | PRN

## 2023-06-20 MED ORDER — ACETAMINOPHEN 10 MG/ML IV SOLN
INTRAVENOUS | Status: DC | PRN
  Administered 2023-06-20: 1000 mg via INTRAVENOUS

## 2023-06-20 MED ORDER — LIDOCAINE HCL (PF) 2 % IJ SOLN
INTRAMUSCULAR | Status: AC
  Filled 2023-06-20: qty 5

## 2023-06-20 MED ORDER — NOREPINEPHRINE 4 MG/250ML-% IV SOLN
INTRAVENOUS | Status: DC | PRN
Start: 2023-06-20 — End: 2023-06-20
  Administered 2023-06-20: 4 ug/min via INTRAVENOUS

## 2023-06-20 MED ORDER — SODIUM BICARBONATE 8.4 % IV SOLN
INTRAVENOUS | Status: DC | PRN
  Administered 2023-06-20 (×2): 50 meq via INTRAVENOUS

## 2023-06-20 MED ORDER — SODIUM CHLORIDE 0.9% FLUSH: 3.0000 mL | INTRAVENOUS | Status: DC | PRN

## 2023-06-20 MED ORDER — PROPOFOL 500 MG/50ML IV EMUL
INTRAVENOUS | Status: DC | PRN
  Administered 2023-06-20: 30 ug/kg/min via INTRAVENOUS

## 2023-06-20 MED ORDER — ETOMIDATE 2 MG/ML IV SOLN
INTRAVENOUS | Status: DC | PRN
  Administered 2023-06-20: 8 mg via INTRAVENOUS

## 2023-06-20 MED ORDER — FENTANYL CITRATE (PF) 250 MCG/5ML IJ SOLN
INTRAMUSCULAR | Status: AC
  Filled 2023-06-20: qty 5

## 2023-06-20 MED ORDER — BUDESONIDE 0.25 MG/2ML IN SUSP
0.2500 mg | Freq: Two times a day (BID) | RESPIRATORY_TRACT | Status: DC
  Administered 2023-06-20 – 2023-07-03 (×25): 0.25 mg via RESPIRATORY_TRACT
  Filled 2023-06-20 (×25): qty 2

## 2023-06-20 MED ORDER — ALBUMIN HUMAN 5 % IV SOLN: INTRAVENOUS | Status: DC | PRN

## 2023-06-20 MED ORDER — HEPARIN SODIUM (PORCINE) 1000 UNIT/ML IJ SOLN
INTRAMUSCULAR | Status: DC | PRN
Start: 2023-06-20 — End: 2023-06-20
  Administered 2023-06-20 (×2): 5000 [IU] via INTRAVENOUS

## 2023-06-20 SURGICAL SUPPLY — 85 items
ANTIFOG SOL W/FOAM PAD STRL (MISCELLANEOUS) ×1 IMPLANT
APPLIER CLIP 9.375 SM OPEN (CLIP) ×1 IMPLANT
BAG COUNTER SPONGE SURGICOUNT (BAG) ×2 IMPLANT
BLADE CLIPPER SURG (BLADE) IMPLANT
CANISTER SUCT 3000ML PPV (MISCELLANEOUS) ×1 IMPLANT
CATH BEACON 5 .035 65 KMP TIP (CATHETERS) IMPLANT
CATH OMNI FLUSH 5F 65CM (CATHETERS) ×1 IMPLANT
CATH QUICKCROSS SUPP .035X90CM (MICROCATHETER) IMPLANT
CHLORAPREP W/TINT 26 (MISCELLANEOUS) ×2 IMPLANT
CLEANER TIP ELECTROSURG 2X2 (MISCELLANEOUS) IMPLANT
CLIP APPLIE 9.375 SM OPEN (CLIP) ×1 IMPLANT
CLIP TI MEDIUM 6 (CLIP) ×1 IMPLANT
CLIP TI WIDE RED SMALL 6 (CLIP) ×1 IMPLANT
CLOSURE PERCLOSE PROSTYLE (VASCULAR PRODUCTS) IMPLANT
COVER MAYO STAND STRL (DRAPES) IMPLANT
COVER PROBE W GEL 5X96 (DRAPES) ×1 IMPLANT
COVER SURGICAL LIGHT HANDLE (MISCELLANEOUS) ×1 IMPLANT
CUTTER FLEX LINEAR 45M (STAPLE) ×1 IMPLANT
DERMABOND ADVANCED .7 DNX12 (GAUZE/BANDAGES/DRESSINGS) ×1 IMPLANT
DEVICE TORQUE KENDALL .025-038 (MISCELLANEOUS) IMPLANT
DRAPE C-ARM 42X72 X-RAY (DRAPES) IMPLANT
ELECT REM PT RETURN 9FT ADLT (ELECTROSURGICAL) ×2 IMPLANT
ELECTRODE REM PT RTRN 9FT ADLT (ELECTROSURGICAL) ×2 IMPLANT
FILTER CO2 0.2 MICRON (VASCULAR PRODUCTS) IMPLANT
FILTER CO2 INSUFFLATOR AX1008 (MISCELLANEOUS) IMPLANT
GLIDEWIRE ADV .035X260CM (WIRE) IMPLANT
GLOVE BIO SURGEON STRL SZ8 (GLOVE) ×1 IMPLANT
GLOVE BIOGEL PI IND STRL 8 (GLOVE) ×2 IMPLANT
GLOVE INDICATOR 8.0 STRL GRN (GLOVE) ×1 IMPLANT
GOWN STRL REUS W/ TWL LRG LVL3 (GOWN DISPOSABLE) ×5 IMPLANT
GOWN STRL REUS W/ TWL XL LVL3 (GOWN DISPOSABLE) ×1 IMPLANT
GOWN STRL REUS W/TWL 2XL LVL3 (GOWN DISPOSABLE) ×2 IMPLANT
GUIDEWIRE ANGLED .035X150CM (WIRE) IMPLANT
IRRIG SUCT STRYKERFLOW 2 WTIP (MISCELLANEOUS) ×1 IMPLANT
IRRIGATION SUCT STRKRFLW 2 WTP (MISCELLANEOUS) ×1 IMPLANT
KIT BASIN OR (CUSTOM PROCEDURE TRAY) ×2 IMPLANT
KIT ENCORE 26 ADVANTAGE (KITS) IMPLANT
KIT TURNOVER KIT B (KITS) ×2 IMPLANT
MANIFOLD NEPTUNE II (INSTRUMENTS) ×1 IMPLANT
NDL 22X1.5 STRL (OR ONLY) (MISCELLANEOUS) ×1 IMPLANT
NDL PERC 18GX7CM (NEEDLE) ×1 IMPLANT
NEEDLE 22X1.5 STRL (OR ONLY) (MISCELLANEOUS) ×1 IMPLANT
NEEDLE PERC 18GX7CM (NEEDLE) ×1 IMPLANT
NS IRRIG 1000ML POUR BTL (IV SOLUTION) ×3 IMPLANT
PACK ENDO MINOR (CUSTOM PROCEDURE TRAY) ×1 IMPLANT
PAD ARMBOARD POSITIONER FOAM (MISCELLANEOUS) ×3 IMPLANT
POUCH RETRIEVAL ECOSAC 10 (ENDOMECHANICALS) ×1 IMPLANT
RELOAD 45 VASCULAR/THIN (ENDOMECHANICALS) IMPLANT
RELOAD STAPLE 45 2.5 WHT GRN (ENDOMECHANICALS) IMPLANT
RELOAD STAPLE 45 3.5 BLU ETS (ENDOMECHANICALS) IMPLANT
RELOAD STAPLE TA45 3.5 REG BLU (ENDOMECHANICALS) IMPLANT
SCISSORS LAP 5X35 DISP (ENDOMECHANICALS) IMPLANT
SET FLUSH CO2 (MISCELLANEOUS) IMPLANT
SET MICROPUNCTURE 5F STIFF (MISCELLANEOUS) IMPLANT
SET TUBE SMOKE EVAC HIGH FLOW (TUBING) ×1 IMPLANT
SHEARS HARMONIC ACE PLUS 36CM (ENDOMECHANICALS) ×1 IMPLANT
SHEATH APTUS 7.0FR 180D 55 (SHEATH) IMPLANT
SHEATH PINNACLE 5F 10CM (SHEATH) ×1 IMPLANT
SHEATH PINNACLE 6F 10CM (SHEATH) IMPLANT
SHEATH PINNACLE 8F 10CM (SHEATH) ×1 IMPLANT
SHEATH PINNACLE ST 6F 45CM (SHEATH) IMPLANT
SLEEVE Z-THREAD 5X100MM (TROCAR) IMPLANT
SOLUTION ANTFG W/FOAM PAD STRL (MISCELLANEOUS) IMPLANT
SPECIMEN JAR SMALL (MISCELLANEOUS) ×1 IMPLANT
STENT VIABAHN 7X29 6FR 135 (Permanent Stent) IMPLANT
STOPCOCK MORSE 400PSI 3WAY (MISCELLANEOUS) IMPLANT
SUT VIC AB 4-0 PS2 27 (SUTURE) ×1 IMPLANT
SUT VICRYL 0 UR6 27IN ABS (SUTURE) IMPLANT
SYR 20ML LL LF (SYRINGE) ×2 IMPLANT
SYR MEDRAD MARK V 150ML (SYRINGE) ×1 IMPLANT
TOWEL GREEN STERILE (TOWEL DISPOSABLE) ×3 IMPLANT
TOWEL GREEN STERILE FF (TOWEL DISPOSABLE) ×1 IMPLANT
TRAY LAPAROSCOPIC MC (CUSTOM PROCEDURE TRAY) ×1 IMPLANT
TROCAR BALLN 12MMX100 BLUNT (TROCAR) ×1 IMPLANT
TROCAR Z THREAD OPTICAL 12X100 (TROCAR) ×1 IMPLANT
TROCAR Z-THREAD OPTICAL 5X100M (TROCAR) ×1 IMPLANT
TUBING HIGH PRESSURE 120CM (CONNECTOR) IMPLANT
TUBING INJECTOR 48 (MISCELLANEOUS) IMPLANT
UNDERPAD 30X36 HEAVY ABSORB (UNDERPADS AND DIAPERS) ×1 IMPLANT
WARMER LAPAROSCOPE (MISCELLANEOUS) ×1 IMPLANT
WATER STERILE IRR 1000ML POUR (IV SOLUTION) ×2 IMPLANT
WIRE AMPLATZ SS-J .035X180CM (WIRE) IMPLANT
WIRE AMPLATZ SS-J .035X260CM (WIRE) IMPLANT
WIRE BENTSON .035X145CM (WIRE) ×1 IMPLANT
WIRE ROSEN-J .035X260CM (WIRE) IMPLANT

## 2023-06-20 NOTE — Anesthesia Procedure Notes (Signed)
 Procedure Name: Intubation Date/Time: 06/20/2023 3:05 PM  Performed by: Loleta Darby Shadwick, CRNAPre-anesthesia Checklist: Patient identified, Emergency Drugs available, Suction available and Patient being monitored Patient Re-evaluated:Patient Re-evaluated prior to induction Oxygen Delivery Method: Circle system utilized Preoxygenation: Pre-oxygenation with 100% oxygen Induction Type: IV induction Ventilation: Mask ventilation without difficulty Laryngoscope Size: Mac and 4 Grade View: Grade I Tube type: Oral Tube size: 7.5 mm Number of attempts: 1 Airway Equipment and Method: Stylet and Oral airway Placement Confirmation: ETT inserted through vocal cords under direct vision, positive ETCO2 and breath sounds checked- equal and bilateral Secured at: 22 cm Tube secured with: Tape Dental Injury: Teeth and Oropharynx as per pre-operative assessment

## 2023-06-20 NOTE — Transfer of Care (Signed)
 Immediate Anesthesia Transfer of Care Note  Patient: Erik [name].  Procedure(s) Performed: LAPAROSCOPY, DIAGNOSTIC (Abdomen) MESENTERIC AORTOGRAM (Abdomen) INSERTION VIABAHN 7x29 STENT IN SUPERIOR MESENTERIC ARTERY (Abdomen)  Patient Location: SICU  Anesthesia Type:General  Level of Consciousness: sedated and Patient remains intubated per anesthesia plan  Airway & Oxygen Therapy: Patient connected to T-piece oxygen, Patient remains intubated per anesthesia plan, and Patient placed on Ventilator (see vital sign flow sheet for setting)  Post-op Assessment: Report given to RN and Post -op Vital signs reviewed and stable  Post vital signs: Reviewed and stable  Last Vitals:  Vitals Value Taken Time  BP 180/60   Temp 98.6   Pulse 93 06/20/23 1745  Resp 16 06/20/23 1745  SpO2 97 % 06/20/23 1745  Vitals shown include unfiled device data.  Last Pain:  Vitals:   06/20/23 1314  TempSrc:   PainSc: 10-Worst pain ever         Complications: No notable events documented.

## 2023-06-20 NOTE — Progress Notes (Signed)
 Attempted to call for report on patient for his surgery. No answer. Patient en route via carelink to [facility].

## 2023-06-20 NOTE — Op Note (Signed)
 NAME: Erik [name].    MRN: [id number] DOB: [birth year]    DATE OF OPERATION: 06/20/2023  PREOP DIAGNOSIS:    Acute mesenteric ischemia  POSTOP DIAGNOSIS:    Same  PROCEDURE:    Ultrasound-guided micropuncture access of the right common femoral artery retrograde fashion Aortogram Selective angiography of the superior mesenteric artery Stenting of the superior mesenteric artery 7 x 29 mm Gore VBX Device assisted closure-Pro-glide  SURGEON: [name]  ASSIST: Lemar Livings, MD  ANESTHESIA: General  EBL: 5 mL  INDICATIONS:    Erik [name]. is a 83 y.o. male who presented to the outside hospital with abdominal pain.  He was originally taken to [facility] with plans for conservative management, however had an increase in lactate as well as leukocytosis with new onset peritonitis.  He was transferred to [facility] due to concern for mesenteric ischemia versus small bowel obstruction.  I immediately booked him for the operating room with my colleague Dr. Violeta Gelinas for diagnostic laparoscopy, mesenteric angiography.  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  General esthesia induced the patient's prepped draped in standard fashion.  Please see Dr. Adelene Idler note for diagnostic laparoscopy.  There is no bowel ischemia appreciated, however I elected to move forward with diagnostic angiography as the CT scan demonstrated near occlusion of the superior mesenteric artery and celiac arteries.  An ultrasound-guided micropuncture needle was used to access the right common femoral artery in retrograde fashion.  This was dilated to 6 [place], and a Pro-glide device was deployed in [employer] fashion.  Next,  a flush catheter was placed in the mesenteric aorta and diagnostic angiogram followed.  This demonstrated occlusion of the celiac artery, superior mesenteric artery.  There was filling of the bilateral renal arteries, and filling of the infrarenal aorta, bilateral iliac arteries.  Multiple orthogonal views were taken in an effort to further define critical stenosis versus occlusion of the celiac artery and superior mesenteric artery.  There appeared to be 99% stenosis of the celiac artery with subsequent occlusion distally after roughly 1 cm.  No branches filled.  The superior mesenteric artery was not appreciated.  No branches filed.  I elected to attempt intervention.  The patient was heparinized.  A 7 French inner Aptus device was brought into the field and positioned at the celiac artery.  Using a series of wires and catheters, I was fortunately able to cannulate the superior mesenteric artery.  The Glidewire advantage was exchanged over a quick cross catheter to a Bentson wire.  I called my partner Dr. Lemar Livings for help as I was having difficulty transitioning the stent.  With more hands, we are able to bring up of the 7 x 29 mm VBX stent, position at across the scalloped portion of the previous fenestrated device, and inflated.  Follow-up angiography demonstrated excellent result with resolution of the occlusion.  There was retrograde filling of the celiac axis.  I was very happy with this result.  The Pro-glide was tightened with follow-up angiography of the right common femoral artery demonstrating excellent filling distally.  Heparin was reversed with use of protamine.  The patient was taken to the ICU in critical condition and remained intubated.  [name], MD [facility] DATE OF DICTATION:   06/20/2023

## 2023-06-20 NOTE — Progress Notes (Signed)
 Pharmacy Antibiotic Note  Erik [name]. is a 83 y.o. male admitted on 06/19/2023 with abdominal pain. Leukocytosis 4/7. Pharmacy has been consulted for Zosyn dosing for intra-abdominal infection.  Plan: -Zosyn 3.375 g IV q8h extended infusion  Height: 5\' 6"  (167.6 cm) Weight: 50.8 kg (112 lb) IBW/kg (Calculated) : 63.8  Temp (24hrs), Avg:98.3 F (36.8 C), Min:98.1 F (36.7 C), Max:98.6 F (37 C)  Recent Labs  Lab 06/19/23 2000 06/20/23 0726  WBC 9.2 20.2*  CREATININE 0.98 1.27*    Estimated Creatinine Clearance: 32.2 mL/min (A) (by C-G formula based on SCr of 1.27 mg/dL (H)).    Allergies  Allergen Reactions   Codeine Anaphylaxis and Swelling    throat swells    Antimicrobials this admission: Zosyn 4/7 >>  Microbiology results: NA  Thank you for allowing pharmacy to be a part of this patient's care.  Pricilla Riffle, PharmD, BCPS Clinical Pharmacist 06/20/2023 9:08 AM

## 2023-06-20 NOTE — Progress Notes (Signed)
  Daily Progress Note  I was called this morning regarding [name] symptoms, concern for SBO with reported moderate celiac and SMA stenosis.  Plan was to evaluate after the first surgical case at [facility].  After case completion, the patient was chart checked demonstrating a worsening in leukocytosis, some improvement in lactate.  He was seen by general surgery, and found to have peritonitis with plans to go to the operating room.    I called my general surgery colleagues to discuss physical exam findings.  My concern and moving forward with operative intervention at [facility], is that they do not have any supplies necessary for endovascular intervention, or even open vascular surgery intervention.  Upon evaluation of the CT scan, there appeared to be severe superior mesenteric artery, celiac artery stenosis.  This has been present for quite some time looking better previous scans.  I called the patient's wife, who stated that he has had some waxing waning pain over the last 2 months, but has been able to eat with no food fear.  At this time, mesenteric ischemia cannot be ruled out.  Rather than moving forward with operating room at [facility], I have asked the patient to be emergently transferred to [facility] for diagnostic laparoscopy, possible angiography.  I have discussed this with my colleague, Dr. Violeta Gelinas general surgery, as well as Dr. Axel Filler, general surgeon at [facility].   Should an SBO be appreciated, and have no plans to stent the SMA, however any concerns for mesenteric ischemia I would move forward with stenting.  My plan is to evaluate him once he arrives.    [name] MD MS [facility] [phone] 06/20/2023  1:29 PM

## 2023-06-20 NOTE — Op Note (Signed)
  06/19/2023 - 06/20/2023  4:00 PM  PATIENT:  Erik [name].  83 y.o. male  PRE-OPERATIVE DIAGNOSIS:  Mesenteric Injury  POST-OPERATIVE DIAGNOSIS:  Mesenteric Injury  PROCEDURE:  Procedure(s): LAPAROSCOPY, DIAGNOSTIC  SURGEON:  Violeta Gelinas, MD  ASSISTANTS: Lyndel Pleasure, MD; Leary Roca, PA-C  ANESTHESIA:   general  EBL:  Total I/O In: 1900 [I.V.:1900] Out: 700 [Urine:500; Emesis/NG output:200]  BLOOD ADMINISTERED:none  DRAINS: none   SPECIMEN:  No Specimen  DISPOSITION OF SPECIMEN:  N/A  COUNTS:  YES  DICTATION: .Dragon Dictation Procedure in detail: Informed consent was obtained.  Patient was transferred emergently from [facility] to the operating room at [facility] [place].  I examined the patient and documented agreement with the surgical plan per Dr. Derrell Lolling and Dr. Sherral Hammers.  He underwent central line and arterial line placement by anesthesia.  He underwent general endotracheal anesthesia.  Foley catheter was placed by nursing.  His abdomen was prepped and draped in a sterile fashion.  Timeout procedure was performed.The supraumbilical region was infiltrated with local. Supraumbilical incision was made. Subcutaneous tissues were dissected down revealing the anterior fascia. This was divided sharply along the midline. Peritoneal cavity was entered under direct vision without complication. A 0 Vicryl pursestring was placed around the fascial opening. Hassan trocar was inserted into the abdomen. The abdomen was insufflated with carbon dioxide in standard fashion. Under direct vision a 5 mm mid port port was placed.  I began inspecting the bowel which all looked viable.  There was a tiny amount of murky fluid in the pelvis but no succus.  We placed an additional 5 mm port in the suprapubic region under direct vision.  There was a patch of omental adhesions up to the anterior abdominal wall which was taken down uneventfully.  Dr. Derrell Lolling then assisted me with running the  bowel in its entirety.  The stomach looked viable.  The ligament of Treitz was identified and the small bowel was run from the ligament of Treitz down to the terminal ileum.  All of the small bowel was viable without evidence of ischemia or other abnormality.  He did appear to have some venous congestion in the mesentery.  The right colon, transverse colon, left colon, and rectosigmoid all appeared viable.  The liver was viable.  At this point, we discussed with Dr. Sherral Hammers in the operating room and he will proceed with mesenteric angiogram.  His help was appreciated.  Ports were removed under direct vision.  Pneumoperitoneum was released.  The supraumbilical fascia was closed by tying the pursestring.  All 3 wounds were irrigated and the skin of each was closed with 4-0 Vicryl's followed by Dermabond.  All counts were correct.  There were no apparent complications from my portion of the procedure.  He remained in the operating room with Dr. Karin Lieu.  PATIENT DISPOSITION: Remains in the operating room with Dr. Karin Lieu   Delay start of Pharmacological VTE agent (>24hrs) due to surgical blood loss or risk of bleeding:  no  Violeta Gelinas, MD, MPH, FACS Pager: [phone]  4/7/20254:00 PM

## 2023-06-20 NOTE — Consult Note (Signed)
 Erik [name]. 1940-07-16  [id number].    Requesting MD: Dr. Toniann Fail Chief Complaint/Reason for Consult: abdominal pain  HPI:  83 yo male with history of left inguinal hernia repair, multiple vascular procedures and CHF presents with 1 month of abdominal pain that acutely worsened over the last 2 days. He has been vomiting and it is green in color. Pain is all over his abdomen, it is constant. He has not had flatus since coming to the hospital  ROS: Review of Systems  Constitutional: Negative.   HENT: Negative.    Eyes: Negative.   Respiratory: Negative.    Cardiovascular: Negative.   Gastrointestinal:  Positive for abdominal pain, nausea and vomiting.  Genitourinary: Negative.   Musculoskeletal: Negative.   Skin: Negative.   Neurological: Negative.   Endo/Heme/Allergies: Negative.   Psychiatric/Behavioral: Negative.      Family History  Problem Relation Age of Onset   Heart disease Brother    Hyperlipidemia Brother    Pancreatic cancer Brother    Hypertension Mother    Cancer Father        unknown type; sounds GI   Emphysema Father    Hyperlipidemia Brother    Hyperlipidemia Brother    Heart attack Brother    Hyperlipidemia Brother    Multiple sclerosis Brother    Kidney disease Neg Hx    Prostate cancer Neg Hx    Colon cancer Neg Hx    Esophageal cancer Neg Hx    Rectal cancer Neg Hx    Stomach cancer Neg Hx     Past Medical History:  Diagnosis Date   Adenomatous colon polyp    Aortic aneurysm (HCC)    BPH (benign prostatic hypertrophy)    CAD (coronary artery disease)    Carotid artery stenosis    a. Bilateral CEA   Cataract    CHF (congestive heart failure) (HCC)    Chronic systolic heart failure (HCC)    a. EF 20-25%, mild LVH, mod HK, mid apicalanteroseptal myocardium, mild MR, LA mod dilated   Collagen vascular disease (HCC)    COPD (chronic obstructive pulmonary disease) (HCC)    Coronary artery disease    a. LHC (08/2013): Lmain:  short 30% distal, LAD: sml D1 & D2, 70% ostial D1, 95-99% LAD stenosis prox D2 LCx: sml/mod ramus subtot. occluded, 40% ostial set off lg OM1, 40% AV LCx after OM1, RCA: 90% prox (DES to RCA and prox LAD)   Diverticulosis    GERD (gastroesophageal reflux disease)    Heart murmur    History of colonic polyps    Hyperlipidemia    Hyperplastic colon polyp    Hypertension    Ischemic cardiomyopathy    RLS (restless legs syndrome)     Past Surgical History:  Procedure Laterality Date   BUBBLE STUDY  02/03/2022   Procedure: BUBBLE STUDY;  Surgeon: Meriam Sprague, MD;  Location: [facility] ENDOSCOPY;  Service: Cardiovascular;;   cardiac stents  09-2013   CAROTID ENDARTERECTOMY  04/17/2008   right   CAROTID ENDARTERECTOMY  05/30/08   Left   CATARACT EXTRACTION W/PHACO Right 10/05/2018   Procedure: CATARACT EXTRACTION PHACO AND INTRAOCULAR LENS PLACEMENT (IOC), RIGHT;  Surgeon: Galen Manila, MD;  Location: [facility] ORS;  Service: Ophthalmology;  Laterality: Right;  [place] 01:06.4 cde 12.79 Fluid Pack Lot # [id number]   CATARACT EXTRACTION W/PHACO Left 11/02/2018   Procedure: CATARACT EXTRACTION PHACO AND INTRAOCULAR LENS PLACEMENT (IOC), LEFT;  Surgeon: Galen Manila, MD;  Location: [facility]  ORS;  Service: Ophthalmology;  Laterality: Left;  [place]  01:10 CDE 11.52 Fluid pack lot # [id number]   CHOLECYSTECTOMY     Gall Bladder   CORONARY ANGIOGRAM  09/11/2013   Procedure: CORONARY ANGIOGRAM;  Surgeon: Lennette Bihari, MD;  Location: [facility] CATH LAB;  Service: Cardiovascular;;   CORONARY ANGIOPLASTY     STENTS   HERNIA REPAIR     lower aorta aneurysm  05/26/2015   [facility]   PERCUTANEOUS STENT INTERVENTION  09/11/2013   Procedure: PERCUTANEOUS STENT INTERVENTION;  Surgeon: Lennette Bihari, MD;  Location: [facility] CATH LAB;  Service: Cardiovascular;;  DES Prox RCA 3.5x15 xience    TEE WITHOUT CARDIOVERSION N/A 02/03/2022   Procedure: TRANSESOPHAGEAL ECHOCARDIOGRAM (TEE);  Surgeon: Meriam Sprague, MD;  Location: [facility]  ENDOSCOPY;  Service: Cardiovascular;  Laterality: N/A;    Social History:  reports that he has been smoking cigarettes. He has a 50 pack-year smoking history. He has never been exposed to tobacco smoke. He uses smokeless tobacco. He reports that he does not drink alcohol and does not use drugs.  Allergies:  Allergies  Allergen Reactions   Codeine Anaphylaxis and Swelling    throat swells    Medications Prior to Admission  Medication Sig Dispense Refill   acetaminophen (TYLENOL) 500 MG tablet Take 1,000 mg by mouth every 6 (six) hours as needed for moderate pain (pain score 4-6).     aspirin EC 81 MG tablet Take 81 mg by mouth every evening.      clopidogrel (PLAVIX) 75 MG tablet TAKE 1 TABLET BY MOUTH EVERY DAY 90 tablet 3   cyanocobalamin (VITAMIN B12) 1000 MCG tablet Take 1,000 mcg by mouth daily.     ezetimibe-simvastatin (VYTORIN) 10-40 MG tablet TAKE 1 TABLET BY MOUTH EVERYDAY AT BEDTIME 90 tablet 3   famotidine (PEPCID) 40 MG tablet Take 40 mg by mouth 2 (two) times daily.     FARXIGA 10 MG TABS tablet TAKE 1 TABLET BY MOUTH DAILY BEFORE BREAKFAST. 30 tablet 11   ferrous sulfate 325 (65 FE) MG tablet Take 325 mg by mouth daily with breakfast.     gabapentin (NEURONTIN) 100 MG capsule Take 1 capsule (100 mg total) by mouth 3 (three) times daily. (Patient taking differently: Take 100-200 mg by mouth See admin instructions. Take 100mg  (1 capsule) by mouth every morning and 200mg  (2 capsules) at night.) 270 capsule 0   methocarbamol (ROBAXIN) 500 MG tablet TAKE 1 TABLET (500 MG TOTAL) BY MOUTH EVERY DAY AT BEDTIME AS NEEDED FOR MUSCLE SPASM 30 tablet 0   metoprolol succinate (TOPROL-XL) 50 MG 24 hr tablet Take 1 tablet (50 mg total) by mouth daily. 90 tablet 3   mirtazapine (REMERON) 15 MG tablet TAKE 1 TABLET BY MOUTH EVERYDAY AT BEDTIME 90 tablet 1   Omega-3 Fatty Acids (FISH OIL) 1000 MG CAPS Take 1 capsule by mouth daily.     rOPINIRole (REQUIP) 3 MG tablet TAKE 1/2 TABLET BY MOUTH  IN THE MORNING, 1/2 TABLET MID DAY & TAKE 1 TABLET AT NIGHT 180 tablet 1   spironolactone (ALDACTONE) 25 MG tablet TAKE 1/2 TABLET BY MOUTH DAILY 45 tablet 3   tamsulosin (FLOMAX) 0.4 MG CAPS capsule TAKE 1 CAPSULE BY MOUTH EVERY DAY 90 capsule 3    Physical Exam: Blood pressure (!) 144/69, pulse (!) 104, temperature 98.6 F (37 C), temperature source Oral, resp. rate 18, height 5\' 6"  (1.676 m), weight 50.8 kg, SpO2 93%. Gen: uncomfortable Resp: nonlabored CV: tachycardic Abd:  soft, tender throughout, nondistended Neuro: AOx4  Results for orders placed or performed during the hospital encounter of 06/19/23 (from the past 48 hours)  Basic metabolic panel     Status: Abnormal   Collection Time: 06/19/23  8:00 PM  Result Value Ref Range   Sodium 135 135 - 145 mmol/L   Potassium 4.2 3.5 - 5.1 mmol/L   Chloride 103 98 - 111 mmol/L   CO2 23 22 - 32 mmol/L   Glucose, Bld 104 (H) 70 - 99 mg/dL    Comment: Glucose reference range applies only to samples taken after fasting for at least 8 hours.   BUN 22 8 - 23 mg/dL   Creatinine, Ser 4.09 0.61 - 1.24 mg/dL   Calcium 8.8 (L) 8.9 - 10.3 mg/dL   GFR, Estimated >81 >19 mL/min    Comment: (NOTE) Calculated using the CKD-EPI Creatinine Equation (2021)    Anion gap 9 5 - 15    Comment: Performed at [facility], [address] [place], [place] [address]  CBC     Status: Abnormal   Collection Time: 06/19/23  8:00 PM  Result Value Ref Range   WBC 9.2 4.0 - 10.5 K/uL   RBC 3.54 (L) 4.22 - 5.81 MIL/uL   Hemoglobin 9.8 (L) 13.0 - 17.0 g/dL   HCT 95.6 (L) 21.3 - 08.6 %   MCV 87.6 80.0 - 100.0 fL   MCH 27.7 26.0 - 34.0 pg   MCHC 31.6 30.0 - 36.0 g/dL   RDW 57.8 (H) 46.9 - 62.9 %   Platelets 280 150 - 400 K/uL   nRBC 0.0 0.0 - 0.2 %    Comment: Performed at [facility], [address], [place], [place] [address]  Urinalysis, Routine w reflex microscopic -Urine, Unspecified Source     Status: Abnormal    Collection Time: 06/19/23  8:22 PM  Result Value Ref Range   Color, Urine YELLOW YELLOW   APPearance CLEAR CLEAR   Specific Gravity, Urine 1.025 1.005 - 1.030   pH 5.5 5.0 - 8.0   Glucose, UA >1,000 (A) NEGATIVE mg/dL   Hgb urine dipstick NEGATIVE NEGATIVE   Bilirubin Urine NEGATIVE NEGATIVE   Ketones, ur NEGATIVE NEGATIVE mg/dL   Protein, ur NEGATIVE NEGATIVE mg/dL   Nitrite NEGATIVE NEGATIVE   Leukocytes,Ua NEGATIVE NEGATIVE   RBC / HPF 0-5 0 - 5 RBC/hpf   WBC, UA 0-5 0 - 5 WBC/hpf   Bacteria, UA NONE SEEN NONE SEEN   Squamous Epithelial / HPF 0-5 0 - 5 /HPF    Comment: Performed at [facility], [address], [place], [place] [address]  Hepatic function panel     Status: Abnormal   Collection Time: 06/20/23  5:58 AM  Result Value Ref Range   Total Protein 8.4 (H) 6.5 - 8.1 g/dL   Albumin 3.5 3.5 - 5.0 g/dL   AST 35 15 - 41 U/L   ALT 14 0 - 44 U/L   Alkaline Phosphatase 78 38 - 126 U/L   Total Bilirubin 0.6 0.0 - 1.2 mg/dL   Bilirubin, Direct 0.1 0.0 - 0.2 mg/dL   Indirect Bilirubin 0.5 0.3 - 0.9 mg/dL    Comment: Performed at [facility], [address] [place], [place] [address]  Basic metabolic panel     Status: Abnormal   Collection Time: 06/20/23  7:26 AM  Result Value Ref Range   Sodium 137 135 - 145 mmol/L   Potassium 3.9 3.5 -  5.1 mmol/L   Chloride 101 98 - 111 mmol/L   CO2 20 (L) 22 - 32 mmol/L   Glucose, Bld 168 (H) 70 - 99 mg/dL    Comment: Glucose reference range applies only to samples taken after fasting for at least 8 hours.   BUN 28 (H) 8 - 23 mg/dL   Creatinine, Ser 1.61 (H) 0.61 - 1.24 mg/dL   Calcium 9.0 8.9 - 09.6 mg/dL   GFR, Estimated 56 (L) >60 mL/min    Comment: (NOTE) Calculated using the CKD-EPI Creatinine Equation (2021)    Anion gap 16 (H) 5 - 15    Comment: Performed at [facility], [address] [place], [place] [address]  CBC with Differential/Platelet     Status:  Abnormal   Collection Time: 06/20/23  7:26 AM  Result Value Ref Range   WBC 20.2 (H) 4.0 - 10.5 K/uL   RBC 4.02 (L) 4.22 - 5.81 MIL/uL   Hemoglobin 10.9 (L) 13.0 - 17.0 g/dL   HCT 98.1 (L) 19.1 - 47.8 %   MCV 91.5 80.0 - 100.0 fL   MCH 27.1 26.0 - 34.0 pg   MCHC 29.6 (L) 30.0 - 36.0 g/dL   RDW 29.5 (H) 62.1 - 30.8 %   Platelets 363 150 - 400 K/uL   nRBC 0.0 0.0 - 0.2 %   Neutrophils Relative % 92 %   Neutro Abs 18.5 (H) 1.7 - 7.7 K/uL   Lymphocytes Relative 2 %   Lymphs Abs 0.3 (L) 0.7 - 4.0 K/uL   Monocytes Relative 6 %   Monocytes Absolute 1.3 (H) 0.1 - 1.0 K/uL   Eosinophils Relative 0 %   Eosinophils Absolute 0.0 0.0 - 0.5 K/uL   Basophils Relative 0 %   Basophils Absolute 0.1 0.0 - 0.1 K/uL   Immature Granulocytes 0 %   Abs Immature Granulocytes 0.09 (H) 0.00 - 0.07 K/uL    Comment: Performed at [facility], [address], [place], [place] [address]  Reticulocytes     Status: Abnormal   Collection Time: 06/20/23  7:26 AM  Result Value Ref Range   Retic Ct Pct 2.3 0.4 - 3.1 %   RBC. 4.02 (L) 4.22 - 5.81 MIL/uL   Retic Count, Absolute 92.1 19.0 - 186.0 K/uL   Immature Retic Fract 26.1 (H) 2.3 - 15.9 %    Comment: Performed at [facility], [address], [place], [place] [address]   DG Abd 1 View Result Date: 06/20/2023 CLINICAL DATA:  Abdominal pain and vomiting. EXAM: ABDOMEN - 1 VIEW COMPARISON:  06/19/2023 FINDINGS: Cholecystectomy clips. Status post stent graft repair of the abdominal aorta. Bilateral renal artery stents. Decreased gaseous distension of the small bowel loops. Previous stool burden within the right colon has progressed to the level of the splenic flexure and proximal descending colon. Contrast material noted within the bladder. IMPRESSION: 1. Decreased gaseous distension of the small bowel loops. 2. Antegrade progression of moderate retained stool in the right colon. Electronically Signed   By: Signa Kell M.D.    On: 06/20/2023 06:37   CT Angio Abd/Pel W and/or Wo Contrast Result Date: 06/19/2023 CLINICAL DATA:  Abdominal pain EXAM: CTA ABDOMEN AND PELVIS WITHOUT AND WITH CONTRAST TECHNIQUE: Multidetector CT imaging of the abdomen and pelvis was performed using the standard protocol during bolus administration of intravenous contrast. Multiplanar reconstructed images and MIPs were obtained and reviewed to evaluate the vascular anatomy. RADIATION DOSE REDUCTION: This exam was  performed according to the departmental dose-optimization program which includes automated exposure control, adjustment of the mA and/or kV according to patient size and/or use of iterative reconstruction technique. CONTRAST:  OMNIPAQUE IOHEXOL 350 MG/ML SOLN COMPARISON:  CT 06/09/2023 FINDINGS: VASCULAR Aorta: Status post endovascular repair of aortic aneurysm with endovascular stent extending from the distal descending thoracic aorta to the iliac vessels. Patent stent. Maximum diameter of excluded aneurysm sac measures 5.4 cm, previously 5.4 cm. 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 Distal patency with no aneurysm or dissection. 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 Distal vascular patency and no dissection or aneurysm Renals: Bilateral renal artery stents which appear patent IMA: Occluded as before. Inflow: Patent common iliac artery stents. External iliac arteries are widely patent. Stenosis at the origin of the internal iliac arteries. Proximal Outflow: Bilateral common femoral and visualized portions of the superficial and profunda femoral arteries are patent without evidence of aneurysm, dissection, vasculitis or significant stenosis. Veins: Suboptimally evaluated Review of the MIP images confirms the above findings. NON-VASCULAR Lower chest: Lung bases demonstrate emphysema. No acute airspace disease. Hepatobiliary: Cholecystectomy.  No biliary dilatation.  Pancreas: No inflammation. Ductal dilatation up to 5 mm which is chronic. Spleen: Normal in size without focal abnormality. Adrenals/Urinary Tract: Adrenal glands are normal. Kidneys show no hydronephrosis. The bladder is unremarkable Stomach/Bowel: Moderate fluid distension of the stomach. Interval development of multiple fluid-filled mildly dilated loops of small bowel measuring up to 3.1 cm. No acute bowel wall thickening. Large volume frothy stool in the right colon. Lymphatic: No suspicious lymph nodes Reproductive: Enlarged prostate gland Other: Negative for pelvic effusion or free air Musculoskeletal: No acute or suspicious osseous abnormality. IMPRESSION: VASCULAR 1. 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 2. 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 NON-VASCULAR 1. Interim development of multiple fluid-filled mildly dilated loops of small bowel, without well-defined transition point suggestive of mild ileus or partial/developing bowel obstruction. 2. Enlarged prostate Electronically Signed   By: Jasmine Pang M.D.   On: 06/19/2023 23:10    Assessment/Plan 83 yo male with acute abdominal pain. Today he has an acute worsening with WBC 20 from 10 yesterday. Ct scan not showing acute transition.    FEN - NPO, NGT insertion, if pain improves likely protocol VTE - SCDs ID - I think empiric antibiotics are reasonable given leukocytosis, UA negative for WBc or bacteria but very positive for glucose. Admit - inpatient  I reviewed last 24 h vitals and pain scores, last 48 h intake and output, last 24 h labs and trends, and last 24 h imaging results.  De Blanch [facility] 06/20/2023, 8:55 AM Please see Amion for pager  number during day hours 7:00am-4:30pm or 7:00am -11:30am on weekends

## 2023-06-20 NOTE — Progress Notes (Signed)
 New admission orders for patient included stat abdominal x-ray, notified by radiology tech Donnella Sham) that she would be up around 6am, when she does her portables for the morning, to do his scan, Dr. Toniann Fail messaged about the scan being done and he was made aware of the radiology tech's timeline (acknowledged with a thumbs up)

## 2023-06-20 NOTE — Anesthesia Procedure Notes (Signed)
 Central Venous Catheter Insertion Performed by: Gaynelle Adu, MD, anesthesiologist Start/End4/09/2023 1:30 PM, 06/20/2023 1:45 PM Patient location: Pre-op. Preanesthetic checklist: patient identified, IV checked, site marked, risks and benefits discussed, surgical consent, monitors and equipment checked, pre-op evaluation, timeout performed and anesthesia consent Position: Trendelenburg Lidocaine 1% used for infiltration and patient sedated Hand hygiene performed , maximum sterile barriers used  and Seldinger technique used Catheter size: 9 Fr Total catheter length 10. Central line was placed.MAC introducer Procedure performed using ultrasound guided technique. Ultrasound Notes:anatomy identified, needle tip was noted to be adjacent to the nerve/plexus identified, no ultrasound evidence of intravascular and/or intraneural injection and image(s) printed for medical record Attempts: 1 Following insertion, line sutured, dressing applied and Biopatch. Post procedure assessment: blood return through all ports, free fluid flow and no air  Patient tolerated the procedure well with no immediate complications.

## 2023-06-20 NOTE — Progress Notes (Signed)
 RT called to bedside due to pt not getting volumes and audible cuff leak. Original placement of ETT was at 24 at the lips. ETT secured at 20 when this RT check placement. ETT advanced back to 24cm at the lips. Cuff leak resolved and normal volumes resumed on the ventilator. BS stable throughout. E-link RN made aware. X-ray ordered to check ETT placement.

## 2023-06-20 NOTE — Progress Notes (Addendum)
 PROGRESS NOTE    Erik [name].  ZOX:[id number] DOB: [birth year] [name]: 06/19/2023 PCP: Excell Seltzer, MD    Brief Narrative:   Erik [name]. is a 83 y.o. male with past medical history significant for CAD s/p PCI, chronic systolic congestive heart failure, abdominal aortic aneurysm s/p endovascular repair, COPD who presented to [facility] ED on 06/19/2023 with progressive abdominal pain over the last 2 days.  Patient reports ongoing intermittent abdominal pain over the last 2-3 months that acutely worsened.  Last bowel movement 2 days prior.  Also endorses nausea without vomiting.  In the ED, temperature 98.3 F, HR 70, RR 20, BP 145/57, SpO2 99% on room air.  WBC 9.2, hemoglobin 9.8, platelet count 280.  Sodium 135, potassium 4.2, chloride 103, CO2 23, glucose 104, BUN 22, creatinine 0.98.  AST 35, ALT 14, total bilirubin 0.5.  Lactic acid 6.2.  Urinalysis unrevealing.  CT angiogram abdomen/pelvis with noted endovascular aortic repair infrarenal abdominal aortic aneurysm with patent graft and stable diameter of the excluded aneurysm sac, redemonstrated significant stenosis proximal celiac artery likely related to median arcuate compression with poststenotic dilation, moderate/severe focal stenosis origin of SMA, multiple fluid-filled mildly dilated loops of small bowel without well-defined transition point concerning for partial/developing bowel obstruction versus ileus, enlarged prostate gland.  General surgery was consulted.  TRH consulted for admission and patient was transferred to [facility] for further evaluation and management.  Assessment & Plan:    Partial small bowel obstruction versus ileus Concern for bowel ischemia Patient presenting with acute worsening abdominal pain over the last 2 days, history of intermittent abdominal pain over the last 2-3 months.  CT angiogram abdomen/pelvis with findings concerning for partial small bowel obstruction versus  ileus. -- General Surgery following, appreciate assistance -- WBC 9.2>20.2 -- Lactic Acid: 6.2>4.8 -- NGT to LWIS -- NPO -- NS 1L bolus followed by NS at 75 mL/h -- Zosyn 3.375 g IV every 8 hours -- Per general surgery, plan to take to the OR emergently  SMA/celiac artery stenosis Patient reporting chronic intermittent abdominal pain over the last 2-3 months with acute worsening as above.  CT angiogram abdomen/pelvis with read demonstrated significant stenosis proximal celiac artery and moderate/severe focal stenosis of SMA. -- Vascular surgery consulted by admitting physician  CAD s/p PCI Chronic systolic congestive heart failure Hypertension TTE 01/30/2022 with LVEF 50-55%, LV with no regional wall motion abnormalities, mild LVH, no aortic stenosis, IVC dilated.  At baseline on metoprolol succinate 50 mg p.o. daily, spironolactone 12.5 mg PO daily, aspirin and Plavix. -- Holding oral medications given bowel obstruction as above -- Labetalol 5 mg IV every 6 hours  COPD Not in acute exacerbation.  Not on any controlling inhalers outpatient.  BPH -- Holding home tamsulosin while NPO  Anemia, chronic -- Hgb 10.9; stable  Peripheral artery disease Hx abdominal aortic aneurysm s/p endovascular repair Hx carotid artery stenosis s/p carotid endarterectomy -- Holding home aspirin, Plavix while NPO.  Not on statin outpatient.  DVT prophylaxis: SCDs Start: 06/20/23 0348    Code Status: Full Code Family Communication: Updated spouse present at bedside this morning  Disposition Plan:  Level of care: Med-Surg Status is: Observation The patient will require care spanning > 2 midnights and should be moved to inpatient because: Abdominal pain secondary to ileus versus small bowel obstruction, concern for ischemic bowel requiring IV antibiotics, IV fluids, NG tube to suction    Consultants:  General Surgery Vascular surgery  Procedures:  None  Antimicrobials:  Zosyn  4/7>>   Subjective: Patient seen examined bedside, lying in bed.  Continues with abdominal pain and distention.  Spouse present at bedside.  RN present and pending placement of NG tube.  WBC count worsened to 20.0 this morning, started on IV antibiotics.  Also lactic acid elevated; with some concerns for potential ischemic bowel given stenosis of SMA/celiac artery.  Vascular surgery, Dr. Karin Lieu consulted by admitting physician.  Patient with no other special complaints, concerns or questions at this time.  Denies headache, no chest pain, no shortness of breath, no fever/chills/night sweats, no vomiting/diarrhea, no focal weakness, no fatigue, no paresthesia.  No acute events overnight per nursing.  Objective: Vitals:   06/20/23 0107 06/20/23 0428 06/20/23 0849 06/20/23 1204  BP: (!) 142/57 (!) 141/67 (!) 144/69 (!) 142/68  Pulse: 95 (!) 104  (!) 101  Resp: 17 18  18   Temp: 98.1 F (36.7 C) 98.6 F (37 C)  (!) 97.5 F (36.4 C)  TempSrc: Oral Oral  Oral  SpO2: 98% 93%  96%  Weight:      Height:        Intake/Output Summary (Last 24 hours) at 06/20/2023 1223 Last data filed at 06/20/2023 0850 Gross per 24 hour  Intake 0 ml  Output 300 ml  Net -300 ml   Filed Weights   06/19/23 1836  Weight: 50.8 kg    Examination:  Physical Exam: GEN: NAD, alert and oriented x 3, ill in appearance HEENT: NCAT, PERRL, EOMI, sclera clear, MMM PULM: CTAB w/o wheezes/crackles, normal respiratory effort CV: RRR w/o M/G/R GI: abd soft, + distention, + TTP diffusely, no appreciable bowel sounds MSK: no peripheral edema, muscle strength globally intact 5/5 bilateral upper/lower extremities NEURO: CN II-XII intact, no focal deficits, sensation to light touch intact PSYCH: normal mood/affect Integumentary: No concerning rashes/lesions/wounds noted on exposed skin surfaces    Data Reviewed: I have personally reviewed following labs and imaging studies  CBC: Recent Labs  Lab 06/19/23 2000  06/20/23 0726  WBC 9.2 20.2*  NEUTROABS  --  18.5*  HGB 9.8* 10.9*  HCT 31.0* 36.8*  MCV 87.6 91.5  PLT 280 363   Basic Metabolic Panel: Recent Labs  Lab 06/19/23 2000 06/20/23 0726  NA 135 137  K 4.2 3.9  CL 103 101  CO2 23 20*  GLUCOSE 104* 168*  BUN 22 28*  CREATININE 0.98 1.27*  CALCIUM 8.8* 9.0   GFR: Estimated Creatinine Clearance: 32.2 mL/min (A) (by C-G formula based on SCr of 1.27 mg/dL (H)). Liver Function Tests: Recent Labs  Lab 06/20/23 0558  AST 35  ALT 14  ALKPHOS 78  BILITOT 0.6  PROT 8.4*  ALBUMIN 3.5   No results for input(s): "LIPASE", "AMYLASE" in the last 168 hours. No results for input(s): "AMMONIA" in the last 168 hours. Coagulation Profile: No results for input(s): "INR", "PROTIME" in the last 168 hours. Cardiac Enzymes: No results for input(s): "CKTOTAL", "CKMB", "CKMBINDEX", "TROPONINI" in the last 168 hours. BNP (last 3 results) No results for input(s): "PROBNP" in the last 8760 hours. HbA1C: No results for input(s): "HGBA1C" in the last 72 hours. CBG: No results for input(s): "GLUCAP" in the last 168 hours. Lipid Profile: No results for input(s): "CHOL", "HDL", "LDLCALC", "TRIG", "CHOLHDL", "LDLDIRECT" in the last 72 hours. Thyroid Function Tests: No results for input(s): "TSH", "T4TOTAL", "FREET4", "T3FREE", "THYROIDAB" in the last 72 hours. Anemia Panel: Recent Labs    06/20/23 0726  FOLATE 15.3  RETICCTPCT  2.3   Sepsis Labs: Recent Labs  Lab 06/20/23 0726 06/20/23 1025  LATICACIDVEN 6.2* 4.8*    No results found for this or any previous visit (from the past 240 hours).       Radiology Studies: DG Abd 1 View Result Date: 06/20/2023 CLINICAL DATA:  Abdominal pain and vomiting. EXAM: ABDOMEN - 1 VIEW COMPARISON:  06/19/2023 FINDINGS: Cholecystectomy clips. Status post stent graft repair of the abdominal aorta. Bilateral renal artery stents. Decreased gaseous distension of the small bowel loops. Previous stool burden  within the right colon has progressed to the level of the splenic flexure and proximal descending colon. Contrast material noted within the bladder. IMPRESSION: 1. Decreased gaseous distension of the small bowel loops. 2. Antegrade progression of moderate retained stool in the right colon. Electronically Signed   By: Signa Kell M.D.   On: 06/20/2023 06:37   CT Angio Abd/Pel W and/or Wo Contrast Result Date: 06/19/2023 CLINICAL DATA:  Abdominal pain EXAM: CTA ABDOMEN AND PELVIS WITHOUT AND WITH CONTRAST TECHNIQUE: Multidetector CT imaging of the abdomen and pelvis was performed using the standard protocol during bolus administration of intravenous contrast. Multiplanar reconstructed images and MIPs were obtained and reviewed to evaluate the vascular anatomy. RADIATION DOSE REDUCTION: This exam was performed according to the departmental dose-optimization program which includes automated exposure control, adjustment of the mA and/or kV according to patient size and/or use of iterative reconstruction technique. CONTRAST:  OMNIPAQUE IOHEXOL 350 MG/ML SOLN COMPARISON:  CT 06/09/2023 FINDINGS: VASCULAR Aorta: Status post endovascular repair of aortic aneurysm with endovascular stent extending from the distal descending thoracic aorta to the iliac vessels. Patent stent. Maximum diameter of excluded aneurysm sac measures 5.4 cm, previously 5.4 cm. 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 Distal patency with no aneurysm or dissection. 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 Distal vascular patency and no dissection or aneurysm Renals: Bilateral renal artery stents which appear patent IMA: Occluded as before. Inflow: Patent common iliac artery stents. External iliac arteries are widely patent. Stenosis at the origin of the internal iliac arteries. Proximal Outflow: Bilateral common femoral and visualized portions of the superficial and  profunda femoral arteries are patent without evidence of aneurysm, dissection, vasculitis or significant stenosis. Veins: Suboptimally evaluated Review of the MIP images confirms the above findings. NON-VASCULAR Lower chest: Lung bases demonstrate emphysema. No acute airspace disease. Hepatobiliary: Cholecystectomy.  No biliary dilatation. Pancreas: No inflammation. Ductal dilatation up to 5 mm which is chronic. Spleen: Normal in size without focal abnormality. Adrenals/Urinary Tract: Adrenal glands are normal. Kidneys show no hydronephrosis. The bladder is unremarkable Stomach/Bowel: Moderate fluid distension of the stomach. Interval development of multiple fluid-filled mildly dilated loops of small bowel measuring up to 3.1 cm. No acute bowel wall thickening. Large volume frothy stool in the right colon. Lymphatic: No suspicious lymph nodes Reproductive: Enlarged prostate gland Other: Negative for pelvic effusion or free air Musculoskeletal: No acute or suspicious osseous abnormality. IMPRESSION: VASCULAR 1. 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 2. 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 NON-VASCULAR 1. Interim development of multiple fluid-filled mildly dilated loops of small bowel, without well-defined transition point suggestive of mild ileus or partial/developing bowel obstruction. 2. Enlarged prostate Electronically  Signed   By: Jasmine Pang M.D.   On: 06/19/2023 23:10        Scheduled Meds:  labetalol  5 mg Intravenous Q4H   Continuous Infusions:  sodium chloride     piperacillin-tazobactam (ZOSYN)  IV       LOS: 0 days    Time spent: 52 minutes spent on 06/20/2023 caring for this patient face-to-face including chart review,  ordering labs/tests, documenting, discussion with nursing staff, consultants, updating family and interview/physical exam    Alvira Philips [place], DO [facility] Available via Epic secure chat 7am-7pm After these hours, please refer to coverage provider listed on [email or web address] 06/20/2023, 12:23 PM

## 2023-06-20 NOTE — Progress Notes (Signed)
 * Day of Surgery *    Subjective: Patient thoroughly discussed with Dr. Sherral Hammers and Dr. Derrell Lolling.  Transferred emergently from [facility] for exploratory laparotomy with possible vascular surgery intervention. ROS negative except as listed above. Objective: Vital signs in last 24 hours: Temp:  [97.5 F (36.4 C)-98.6 F (37 C)] 97.5 F (36.4 C) (04/07 1204) Pulse Rate:  [70-104] 101 (04/07 1204) Resp:  [17-20] 18 (04/07 1204) BP: (138-145)/(57-69) 142/68 (04/07 1204) SpO2:  [93 %-99 %] 96 % (04/07 1204) Weight:  [50.8 kg] 50.8 kg (04/06 1836) Last BM Date : 06/19/23  Intake/Output from previous day: No intake/output data recorded. Intake/Output this shift: Total I/O In: -  Out: 300 [Urine:100; Emesis/NG output:200]  Agree with examination findings from general surgery and vascular surgery  Lab Results: CBC  Recent Labs    06/19/23 2000 06/20/23 0726  WBC 9.2 20.2*  HGB 9.8* 10.9*  HCT 31.0* 36.8*  PLT 280 363   BMET Recent Labs    06/19/23 2000 06/20/23 0726  NA 135 137  K 4.2 3.9  CL 103 101  CO2 23 20*  GLUCOSE 104* 168*  BUN 22 28*  CREATININE 0.98 1.27*  CALCIUM 8.8* 9.0   PT/INR No results for input(s): "LABPROT", "INR" in the last 72 hours. ABG No results for input(s): "PHART", "HCO3" in the last 72 hours.  Invalid input(s): "PCO2", "PO2"  Studies/Results: HYBRID OR IMAGING ([facility] ONLY) Result Date: 06/20/2023 There is no interpretation for this exam.  This order is for images obtained during a surgical procedure.  Please See "Surgeries" Tab for more information regarding the procedure.   DG Abd Portable 1V Result Date: 06/20/2023 CLINICAL DATA:  NG tube placement EXAM: PORTABLE ABDOMEN - 1 VIEW COMPARISON:  06/20/2023 FINDINGS: NG tube tip is in the stomach with the side port in the proximal stomach. Nonobstructive bowel gas pattern. Abdominal aortic stent graft noted. Visualized lungs clear. IMPRESSION: NG tube tip in the stomach. Electronically  Signed   By: Charlett Nose M.D.   On: 06/20/2023 13:52   DG Abd 1 View Result Date: 06/20/2023 CLINICAL DATA:  Abdominal pain and vomiting. EXAM: ABDOMEN - 1 VIEW COMPARISON:  06/19/2023 FINDINGS: Cholecystectomy clips. Status post stent graft repair of the abdominal aorta. Bilateral renal artery stents. Decreased gaseous distension of the small bowel loops. Previous stool burden within the right colon has progressed to the level of the splenic flexure and proximal descending colon. Contrast material noted within the bladder. IMPRESSION: 1. Decreased gaseous distension of the small bowel loops. 2. Antegrade progression of moderate retained stool in the right colon. Electronically Signed   By: Signa Kell M.D.   On: 06/20/2023 06:37   CT Angio Abd/Pel W and/or Wo Contrast Result Date: 06/19/2023 CLINICAL DATA:  Abdominal pain EXAM: CTA ABDOMEN AND PELVIS WITHOUT AND WITH CONTRAST TECHNIQUE: Multidetector CT imaging of the abdomen and pelvis was performed using the standard protocol during bolus administration of intravenous contrast. Multiplanar reconstructed images and MIPs were obtained and reviewed to evaluate the vascular anatomy. RADIATION DOSE REDUCTION: This exam was performed according to the departmental dose-optimization program which includes automated exposure control, adjustment of the mA and/or kV according to patient size and/or use of iterative reconstruction technique. CONTRAST:  OMNIPAQUE IOHEXOL 350 MG/ML SOLN COMPARISON:  CT 06/09/2023 FINDINGS: VASCULAR Aorta: Status post endovascular repair of aortic aneurysm with endovascular stent extending from the distal descending thoracic aorta to the iliac vessels. Patent stent. Maximum diameter of excluded aneurysm sac measures  5.4 cm, previously 5.4 cm. 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 Distal patency with no aneurysm or dissection. SMA: Moderate severe stenosis at the  origin of the SMA. Distal vascular patency and no dissection or aneurysm Renals: Bilateral renal artery stents which appear patent IMA: Occluded as before. Inflow: Patent common iliac artery stents. External iliac arteries are widely patent. Stenosis at the origin of the internal iliac arteries. Proximal Outflow: Bilateral common femoral and visualized portions of the superficial and profunda femoral arteries are patent without evidence of aneurysm, dissection, vasculitis or significant stenosis. Veins: Suboptimally evaluated Review of the MIP images confirms the above findings. NON-VASCULAR Lower chest: Lung bases demonstrate emphysema. No acute airspace disease. Hepatobiliary: Cholecystectomy.  No biliary dilatation. Pancreas: No inflammation. Ductal dilatation up to 5 mm which is chronic. Spleen: Normal in size without focal abnormality. Adrenals/Urinary Tract: Adrenal glands are normal. Kidneys show no hydronephrosis. The bladder is unremarkable Stomach/Bowel: Moderate fluid distension of the stomach. Interval development of multiple fluid-filled mildly dilated loops of small bowel measuring up to 3.1 cm. No acute bowel wall thickening. Large volume frothy stool in the right colon. Lymphatic: No suspicious lymph nodes Reproductive: Enlarged prostate gland Other: Negative for pelvic effusion or free air Musculoskeletal: No acute or suspicious osseous abnormality. IMPRESSION: VASCULAR 1. 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 2. 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 NON-VASCULAR 1. Interim development of multiple fluid-filled mildly dilated loops of small bowel, without well-defined transition point suggestive of mild ileus or partial/developing  bowel obstruction. 2. Enlarged prostate Electronically Signed   By: Jasmine Pang M.D.   On: 06/19/2023 23:10    Anti-infectives: Anti-infectives (From admission, onward)    Start     Dose/Rate Route Frequency Ordered Stop   06/20/23 1000  [MAR Hold]  piperacillin-tazobactam (ZOSYN) IVPB 3.375 g        (MAR Hold since Mon 06/20/2023 at 1419.Hold Reason: Transfer to a Procedural area)   3.375 g 12.5 mL/hr over 240 Minutes Intravenous Every 8 hours 06/20/23 0903         Assessment/Plan: For emergent diagnostic laparoscopy followed by possible laparotomy and bowel resection.  Additionally, Dr. Sherral Hammers may be performing vascular interventions.  Procedure, risks, and benefits have been discussed already and consent documented.  Antibiotics ordered..  LOS: 0 days    Violeta Gelinas, MD, MPH, FACS Trauma & General Surgery Use [email or web address] to contact on call provider  4/7/2025Patient ID: Erik [name]., male   DOB: November 06, 1940, 83 y.o.   MRN: [id number]

## 2023-06-20 NOTE — Anesthesia Postprocedure Evaluation (Signed)
 Anesthesia Post Note  Patient: Erik [name].  Procedure(s) Performed: LAPAROSCOPY, DIAGNOSTIC (Abdomen) MESENTERIC AORTOGRAM (Abdomen) INSERTION VIABAHN 7x29 STENT IN SUPERIOR MESENTERIC ARTERY (Abdomen)     Patient location during evaluation: SICU Anesthesia Type: General Level of consciousness: sedated Pain management: pain level controlled Vital Signs Assessment: post-procedure vital signs reviewed and stable Respiratory status: patient remains intubated per anesthesia plan Cardiovascular status: stable Postop Assessment: no apparent nausea or vomiting Anesthetic complications: no  No notable events documented.  Last Vitals:  Vitals:   06/20/23 0849 06/20/23 1204  BP: (!) 144/69 (!) 142/68  Pulse:  (!) 101  Resp:  18  Temp:  (!) 36.4 C  SpO2:  96%    Last Pain:  Vitals:   06/20/23 1314  TempSrc:   PainSc: 10-Worst pain ever                 Korvin Valentine,W. EDMOND

## 2023-06-20 NOTE — Anesthesia Preprocedure Evaluation (Addendum)
 Anesthesia Evaluation  Patient identified by MRN, date of birth, ID band Patient awake    Reviewed: Allergy & Precautions, NPO status , Patient's Chart, lab work & pertinent test results  Airway Mallampati: I       Dental  (+) Caps, Poor Dentition   Pulmonary Current Smoker and Patient abstained from smoking.   Pulmonary exam normal        Cardiovascular hypertension, Pt. on home beta blockers + CAD, + Past MI and +CHF  Normal cardiovascular exam     Neuro/Psych    GI/Hepatic ,GERD  Medicated,,  Endo/Other    Renal/GU      Musculoskeletal   Abdominal Normal abdominal exam  (+)   Peds  Hematology  (+) Blood dyscrasia, anemia   Anesthesia Other Findings MPRESSIONS     1. Left ventricular ejection fraction, by estimation, is 50 to 55%. The  left ventricle has low normal function. The left ventricle has no regional  wall motion abnormalities. There is mild left ventricular hypertrophy.  Left ventricular diastolic function   could not be evaluated.   2. Right ventricular systolic function is normal. The right ventricular  size is normal. There is moderately elevated pulmonary artery systolic  pressure.   3. Left atrial size was moderately dilated.   4. The mitral valve is normal in structure. Mild to moderate mitral valve  regurgitation. Mild mitral stenosis. Severe mitral annular calcification.   5. The aortic valve has an indeterminant number of cusps. Aortic valve  regurgitation is mild. Aortic valve sclerosis/calcification is present,  without any evidence of aortic stenosis.   6. The inferior vena cava is dilated in size with <50% respiratory  variability, suggesting right atrial pressure of 15 mmHg.   FINDINGS   Left Ventricle: Left ventricular ejection fraction, by estimation, is 50  t    Reproductive/Obstetrics                             Anesthesia Physical Anesthesia  Plan  ASA: 4 and emergent  Anesthesia Plan: General   Post-op Pain Management: Minimal or no pain anticipated   Induction: Intravenous, Cricoid pressure planned and Rapid sequence  PONV Risk Score and Plan: 2 and Ondansetron, Dexamethasone and Treatment may vary due to age or medical condition  Airway Management Planned: Oral ETT  Additional Equipment: 3D TEE  Intra-op Plan:   Post-operative Plan: Extubation in OR  Informed Consent: I have reviewed the patients History and Physical, chart, labs and discussed the procedure including the risks, benefits and alternatives for the proposed anesthesia with the patient or authorized representative who has indicated his/her understanding and acceptance.     Dental advisory given  Plan Discussed with: CRNA and Anesthesiologist  Anesthesia Plan Comments: Ezriel Boffa Brennen Camper. is a 83 y.o. male with past medical history significant for CAD s/p PCI, chronic systolic congestive heart failure, abdominal aortic aneurysm s/p endovascular repair, COPD who presented to [facility] ED on 06/19/2023 with progressive abdominal pain over the last 2 days.  Patient reports ongoing intermittent abdominal pain over the last 2-3 months that acutely worsened.  Last bowel movement 2 days prior.  Also endorses nausea without vomiting.    )       Anesthesia Quick Evaluation

## 2023-06-20 NOTE — Anesthesia Preprocedure Evaluation (Addendum)
 Anesthesia Evaluation  Patient identified by MRN, date of birth, ID band Patient awake    Reviewed: Allergy & Precautions, H&P , NPO status , Patient's Chart, lab work & pertinent test results  Airway Mallampati: II  TM Distance: >3 FB Neck ROM: Full    Dental no notable dental hx. (+) Teeth Intact, Dental Advisory Given   Pulmonary COPD, Current Smoker and Patient abstained from smoking.   Pulmonary exam normal breath sounds clear to auscultation       Cardiovascular hypertension, Pt. on medications and Pt. on home beta blockers + CAD, + Past MI, + Cardiac Stents, + Peripheral Vascular Disease and +CHF  + Valvular Problems/Murmurs MR  Rhythm:Regular Rate:Tachycardia     Neuro/Psych negative neurological ROS  negative psych ROS   GI/Hepatic Neg liver ROS,GERD  Medicated,,  Endo/Other  negative endocrine ROS    Renal/GU negative Renal ROS  negative genitourinary   Musculoskeletal   Abdominal   Peds  Hematology  (+) Blood dyscrasia, anemia   Anesthesia Other Findings   Reproductive/Obstetrics negative OB ROS                             Anesthesia Physical Anesthesia Plan  ASA: 4  Anesthesia Plan: General   Post-op Pain Management: Ofirmev IV (intra-op)*   Induction: Intravenous  PONV Risk Score and Plan: 2 and Ondansetron, Dexamethasone and Treatment may vary due to age or medical condition  Airway Management Planned: Oral ETT  Additional Equipment: Arterial line, CVP and Ultrasound Guidance Line Placement  Intra-op Plan:   Post-operative Plan: Possible Post-op intubation/ventilation  Informed Consent: I have reviewed the patients History and Physical, chart, labs and discussed the procedure including the risks, benefits and alternatives for the proposed anesthesia with the patient or authorized representative who has indicated his/her understanding and acceptance.     Dental  advisory given  Plan Discussed with: CRNA  Anesthesia Plan Comments:        Anesthesia Quick Evaluation

## 2023-06-20 NOTE — Progress Notes (Signed)
 eLink Physician-Brief Progress Note Patient Name: Erik [name]. DOB: [birth year] MRN: [id number]   Date of Service  06/20/2023  HPI/Events of Note  83 year old male patient with a known history of severe coronary artery disease with prior coronary artery stenting as well as AAA repair complicated further by heart failure with reduced EF COPD and tobacco abuse presented with acute mesenteric ischemia.  Evidence of severe metabolic acidosis, adequate ventilation/oxygenation.  eICU Interventions  Currently on saline infusion and status post SMA stenting.  For now we can continue to trend given that he is not on vasopressors.  Trend lactate.  A.m. ABG   0128 -endotracheal tube had to be advanced to 24 cm at the lips.  Cuff leak resolved.  Radiograph reviewed with appropriate placement.  Maintain at 24 for now  0552 -notified about 2 black tarry stools this morning.  Will send for fecal occult testing.  Hemoglobin 12.3.  Will trend hemoglobin every 6 hours.  Increase PPI to every 12 hours.  Discontinue prophylactic heparin for now.  Remains on clopidogrel.  This is likely secondary to her underlying mesenteric ischemia, but continue observation.  Intervention Category Major Interventions: Acid-Base disturbance - evaluation and management  Mykhia Danish 06/20/2023, 8:19 PM

## 2023-06-20 NOTE — H&P (Addendum)
 History and Physical    Erik [name]. NWG:[id number] DOB: [birth year] [name]: 06/19/2023  Patient coming from: Home.  Chief Complaint: Abdominal pain and nausea.  HPI: Erik [name]. is a 83 y.o. male with history of CAD status post PCI, COPD, chronic HFrEF, abdominal aortic aneurysm status post endovascular repair presents to the ER with complaints of abdominal pain.  Patient states he has been having abdominal pain for almost 2 to 3 months but last 2 days has acutely worsened.  Pain is diffuse.  Has been having nausea no vomiting.  Last bowel movement was 2 days ago.  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  On-call general surgeon Dr. Fredricka Bonine was consulted and patient admitted for further management.  Labs show hemoglobin of 9.8 creatinine 0.9.  Review of Systems: As per HPI, rest all negative.   Past Medical History:  Diagnosis Date   Adenomatous colon polyp    Aortic aneurysm (HCC)    BPH (benign prostatic hypertrophy)    CAD (coronary artery disease)    Carotid artery stenosis    a. Bilateral CEA   Cataract    CHF (congestive heart failure) (HCC)    Chronic systolic heart failure (HCC)    a. EF 20-25%, mild LVH, mod HK, mid apicalanteroseptal myocardium, mild MR, LA mod dilated   Collagen vascular disease (HCC)    COPD (chronic obstructive pulmonary disease) (HCC)    Coronary artery disease    a. LHC (08/2013): Lmain: short 30% distal, LAD: sml D1 & D2, 70% ostial D1, 95-99% LAD stenosis prox D2 LCx: sml/mod ramus subtot. occluded, 40% ostial set off lg OM1, 40% AV LCx after OM1, RCA: 90% prox (DES to RCA and prox LAD)   Diverticulosis    GERD (gastroesophageal reflux disease)    Heart murmur    History of colonic polyps    Hyperlipidemia    Hyperplastic colon polyp    Hypertension    Ischemic cardiomyopathy    RLS (restless  legs syndrome)     Past Surgical History:  Procedure Laterality Date   BUBBLE STUDY  02/03/2022   Procedure: BUBBLE STUDY;  Surgeon: Meriam Sprague, MD;  Location: [facility] ENDOSCOPY;  Service: Cardiovascular;;   cardiac stents  09-2013   CAROTID ENDARTERECTOMY  04/17/2008   right   CAROTID ENDARTERECTOMY  05/30/08   Left   CATARACT EXTRACTION W/PHACO Right 10/05/2018   Procedure: CATARACT EXTRACTION PHACO AND INTRAOCULAR LENS PLACEMENT (IOC), RIGHT;  Surgeon: Galen Manila, MD;  Location: [facility] ORS;  Service: Ophthalmology;  Laterality: Right;  [place] 01:06.4 cde 12.79 Fluid Pack Lot # [id number]   CATARACT EXTRACTION W/PHACO Left 11/02/2018   Procedure: CATARACT EXTRACTION PHACO AND INTRAOCULAR LENS PLACEMENT (IOC), LEFT;  Surgeon: Galen Manila, MD;  Location: [facility] ORS;  Service: Ophthalmology;  Laterality: Left;  [place]  01:10 CDE 11.52 Fluid pack lot # [id number]   CHOLECYSTECTOMY     Gall Bladder   CORONARY ANGIOGRAM  09/11/2013   Procedure: CORONARY ANGIOGRAM;  Surgeon: Lennette Bihari, MD;  Location: [facility] CATH LAB;  Service: Cardiovascular;;   CORONARY ANGIOPLASTY     STENTS   HERNIA REPAIR     lower aorta aneurysm  05/26/2015   [facility]   PERCUTANEOUS STENT INTERVENTION  09/11/2013   Procedure: PERCUTANEOUS STENT INTERVENTION;  Surgeon: Lennette Bihari, MD;  Location: [facility] CATH LAB;  Service: Cardiovascular;;  DES Prox RCA 3.5x15 xience    TEE WITHOUT CARDIOVERSION N/A 02/03/2022   Procedure: TRANSESOPHAGEAL ECHOCARDIOGRAM (TEE);  Surgeon: Meriam Sprague, MD;  Location: [facility] ENDOSCOPY;  Service: Cardiovascular;  Laterality: N/A;     reports that he has been smoking cigarettes. He has a 50 pack-year smoking history. He has never been exposed to tobacco smoke. He uses smokeless tobacco. He reports that he does not drink alcohol and does not use drugs.  Allergies  Allergen Reactions   Codeine Anaphylaxis and Swelling    throat swells    Family History  Problem Relation Age of Onset    Heart disease Brother    Hyperlipidemia Brother    Pancreatic cancer Brother    Hypertension Mother    Cancer Father        unknown type; sounds GI   Emphysema Father    Hyperlipidemia Brother    Hyperlipidemia Brother    Heart attack Brother    Hyperlipidemia Brother    Multiple sclerosis Brother    Kidney disease Neg Hx    Prostate cancer Neg Hx    Colon cancer Neg Hx    Esophageal cancer Neg Hx    Rectal cancer Neg Hx    Stomach cancer Neg Hx     Prior to Admission medications   Medication Sig Start Date End Date Taking? Authorizing Provider  acetaminophen (TYLENOL) 500 MG tablet Take 1,000 mg by mouth at bedtime.    [provider]  aspirin EC 81 MG tablet Take 81 mg by mouth every evening.     [provider]  clopidogrel (PLAVIX) 75 MG tablet TAKE 1 TABLET BY MOUTH EVERY DAY 11/03/22   Laurey Morale, MD  cyanocobalamin (VITAMIN B12) 1000 MCG tablet Take 1,000 mcg by mouth daily.    [provider]  ezetimibe-simvastatin (VYTORIN) 10-40 MG tablet TAKE 1 TABLET BY MOUTH EVERYDAY AT BEDTIME 10/06/22   Bedsole, Amy E, MD  famotidine (PEPCID) 40 MG tablet Take 40 mg by mouth 2 (two) times daily.    [provider]  FARXIGA 10 MG TABS tablet TAKE 1 TABLET BY MOUTH DAILY BEFORE BREAKFAST. 11/29/22   Laurey Morale, MD  ferrous sulfate 325 (65 FE) MG tablet Take 325 mg by mouth daily with breakfast.    [provider]  gabapentin (NEURONTIN) 100 MG capsule Take 1 capsule (100 mg total) by mouth 3 (three) times daily. 03/29/23   Bedsole, Amy E, MD  methocarbamol (ROBAXIN) 500 MG tablet TAKE 1 TABLET (500 MG TOTAL) BY MOUTH EVERY DAY AT BEDTIME AS NEEDED FOR MUSCLE SPASM 06/09/23   Bedsole, Amy E, MD  metoprolol succinate (TOPROL-XL) 50 MG 24 hr tablet Take 1 tablet (50 mg total) by mouth daily. 03/29/23   Laurey Morale, MD  mirtazapine (REMERON) 15 MG tablet TAKE 1 TABLET BY MOUTH EVERYDAY AT BEDTIME 03/07/23   Bedsole, Amy E, MD   Omega-3 Fatty Acids (FISH OIL) 1000 MG CAPS Take 1 capsule by mouth daily.    [provider]  rOPINIRole (REQUIP) 3 MG tablet TAKE 1/2 TABLET BY MOUTH IN THE MORNING, 1/2 TABLET MID DAY & TAKE 1 TABLET AT NIGHT 02/08/23   Bedsole, Amy E, MD  spironolactone (ALDACTONE) 25 MG tablet TAKE 1/2 TABLET BY MOUTH DAILY 03/29/23   Laurey Morale, MD  tamsulosin (FLOMAX) 0.4 MG CAPS capsule TAKE 1 CAPSULE BY MOUTH EVERY DAY 09/08/22   Excell Seltzer, MD    Physical Exam:  Constitutional: Moderately built and nourished. Vitals:   06/19/23 1835 06/19/23 1836 06/19/23 2115 06/20/23 0107  BP: (!) 145/57  138/66 (!) 142/57  Pulse: 70  79 95  Resp: 20  18 17   Temp: 98.3 F (36.8 C)  98.3 F (36.8 C) 98.1 F (36.7 C)  TempSrc: Oral  Oral Oral  SpO2: 99%  95% 98%  Weight:  50.8 kg    Height:  5\' 6"  (1.676 m)     Eyes: Anicteric no pallor. ENMT: No discharge from the ears eyes nose or mouth. Neck: No mass felt.  No neck rigidity. Respiratory: No rhonchi or crepitations. Cardiovascular: S1 and S2 heard. Abdomen: Soft mild tenderness diffusely no guarding or rigidity. Musculoskeletal: No edema. Skin: No rash. Neurologic: Alert awake oriented to time place and person.  Moves all extremities. Psychiatric: Appear normal.  Normal affect.   Labs on Admission: I have personally reviewed following labs and imaging studies  CBC: Recent Labs  Lab 06/19/23 2000  WBC 9.2  HGB 9.8*  HCT 31.0*  MCV 87.6  PLT 280   Basic Metabolic Panel: Recent Labs  Lab 06/19/23 2000  NA 135  K 4.2  CL 103  CO2 23  GLUCOSE 104*  BUN 22  CREATININE 0.98  CALCIUM 8.8*   GFR: Estimated Creatinine Clearance: 41.8 mL/min (by C-G formula based on SCr of 0.98 mg/dL). Liver Function Tests: No results for input(s): "AST", "ALT", "ALKPHOS", "BILITOT", "PROT", "ALBUMIN" in the last 168 hours. No results for input(s): "LIPASE", "AMYLASE" in the last 168 hours. No results for input(s): "AMMONIA" in the  last 168 hours. Coagulation Profile: No results for input(s): "INR", "PROTIME" in the last 168 hours. Cardiac Enzymes: No results for input(s): "CKTOTAL", "CKMB", "CKMBINDEX", "TROPONINI" in the last 168 hours. BNP (last 3 results) No results for input(s): "PROBNP" in the last 8760 hours. HbA1C: No results for input(s): "HGBA1C" in the last 72 hours. CBG: No results for input(s): "GLUCAP" in the last 168 hours. Lipid Profile: No results for input(s): "CHOL", "HDL", "LDLCALC", "TRIG", "CHOLHDL", "LDLDIRECT" in the last 72 hours. Thyroid Function Tests: No results for input(s): "TSH", "T4TOTAL", "FREET4", "T3FREE", "THYROIDAB" in the last 72 hours. Anemia Panel: No results for input(s): "VITAMINB12", "FOLATE", "FERRITIN", "TIBC", "IRON", "RETICCTPCT" in the last 72 hours. Urine analysis:    Component Value Date/Time   COLORURINE YELLOW 06/19/2023 2022   APPEARANCEUR CLEAR 06/19/2023 2022   APPEARANCEUR Clear 09/10/2020 1110   LABSPEC 1.025 06/19/2023 2022   PHURINE 5.5 06/19/2023 2022   GLUCOSEU >1,000 (A) 06/19/2023 2022   HGBUR NEGATIVE 06/19/2023 2022   BILIRUBINUR NEGATIVE 06/19/2023 2022   BILIRUBINUR Negative 09/10/2020 1110   KETONESUR NEGATIVE 06/19/2023 2022   PROTEINUR NEGATIVE 06/19/2023 2022   UROBILINOGEN 0.2 05/27/2008 1117   NITRITE NEGATIVE 06/19/2023 2022   LEUKOCYTESUR NEGATIVE 06/19/2023 2022   Sepsis Labs: @LABRCNTIP (procalcitonin:4,lacticidven:4) )No results found for this or any previous visit (from the past 240 hours).   Radiological Exams on Admission: CT Angio Abd/Pel W and/or Wo Contrast Result Date: 06/19/2023 CLINICAL DATA:  Abdominal pain EXAM: CTA ABDOMEN AND PELVIS WITHOUT AND WITH CONTRAST TECHNIQUE: Multidetector CT imaging of the abdomen and pelvis was performed using the standard protocol during bolus administration of intravenous contrast. Multiplanar reconstructed images and MIPs were obtained and reviewed to evaluate the vascular anatomy.  RADIATION DOSE REDUCTION: This exam was performed according to the departmental dose-optimization program which includes automated exposure control, adjustment of the mA and/or kV according to patient size and/or use of  iterative reconstruction technique. CONTRAST:  OMNIPAQUE IOHEXOL 350 MG/ML SOLN COMPARISON:  CT 06/09/2023 FINDINGS: VASCULAR Aorta: Status post endovascular repair of aortic aneurysm with endovascular stent extending from the distal descending thoracic aorta to the iliac vessels. Patent stent. Maximum diameter of excluded aneurysm sac measures 5.4 cm, previously 5.4 cm. 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 Distal patency with no aneurysm or dissection. 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 Distal vascular patency and no dissection or aneurysm Renals: Bilateral renal artery stents which appear patent IMA: Occluded as before. Inflow: Patent common iliac artery stents. External iliac arteries are widely patent. Stenosis at the origin of the internal iliac arteries. Proximal Outflow: Bilateral common femoral and visualized portions of the superficial and profunda femoral arteries are patent without evidence of aneurysm, dissection, vasculitis or significant stenosis. Veins: Suboptimally evaluated Review of the MIP images confirms the above findings. NON-VASCULAR Lower chest: Lung bases demonstrate emphysema. No acute airspace disease. Hepatobiliary: Cholecystectomy.  No biliary dilatation. Pancreas: No inflammation. Ductal dilatation up to 5 mm which is chronic. Spleen: Normal in size without focal abnormality. Adrenals/Urinary Tract: Adrenal glands are normal. Kidneys show no hydronephrosis. The bladder is unremarkable Stomach/Bowel: Moderate fluid distension of the stomach. Interval development of multiple fluid-filled mildly dilated loops of small bowel measuring up to 3.1 cm. No acute bowel wall thickening.  Large volume frothy stool in the right colon. Lymphatic: No suspicious lymph nodes Reproductive: Enlarged prostate gland Other: Negative for pelvic effusion or free air Musculoskeletal: No acute or suspicious osseous abnormality. IMPRESSION: VASCULAR 1. 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 2. 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 NON-VASCULAR 1. Interim development of multiple fluid-filled mildly dilated loops of small bowel, without well-defined transition point suggestive of mild ileus or partial/developing bowel obstruction. 2. Enlarged prostate Electronically Signed   By: Jasmine Pang M.D.   On: 06/19/2023 23:10    Assessment/Plan Principal Problem:   Abdominal pain Active Problems:   Family history of coronary artery disease in brother   CAD S/P percutaneous coronary angioplasty   COPD, mild (HCC)   Hyperlipidemia   Bilateral carotid artery stenosis   BPH (benign prostatic hyperplasia)   Chronic systolic heart failure (HCC)   HTN (hypertension)   BPH with obstruction/lower urinary tract symptoms   Abdominal aortic aneurysm (AAA) (HCC)   Restless leg syndrome   Anemia   SBO (small bowel obstruction) (HCC)    Abdominal pain concerning for partial small bowel obstruction versus ileus.  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  General surgery has been consulted.  Will keep patient NPO.  Repeat KUB.  IV fluids pain daily medications.  Check lactic acid levels. Addendum - I did consult vascular surgeon Dr. Karin Lieu given the vascular changes in CT abdomen.. Hypertension will keep patient on as needed IV labetalol.  Follow blood pressure trends. History of chronic HFrEF last EF measured was 50% in November 2023..   Takes Toprol-XL and Aldactone presently NPO. History of CAD status post PCI denies any chest pain.  Takes aspirin Plavix metoprolol and Vytorin.  Presently NPO. COPD not actively wheezing. BPH takes Flomax. Anemia appears to be chronic.  Follow CBC. Peripheral arterial disease and abdominal  aortic aneurysm status post endovascular repair.  History of carotid endarterectomy.  Since patient has persistent abdominal pain with differential including partial small bowel obstruction versus ileus will need more than 2 midnight stay.   DVT prophylaxis: SCDs. Code Status: Full code. Family Communication: Discussed with patient. Disposition Plan: Medical floor. Consults called: General Surgery.Vascular Surgery. Admission status: Observation.

## 2023-06-20 NOTE — Progress Notes (Signed)
 Intubated, sedated Groin soft Recommend keeping n.p.o., NG tube to suction Please administer Plavix, rectal aspirin  Extubation per ICU

## 2023-06-20 NOTE — Op Note (Signed)
 Hospital Consult    Reason for Consult: Concern for ischemic gut Requesting Physician: Hospital medicine, general surgery MRN #:  [id number]  History of Present Illness: This is a 83 y.o. male with history of multiple vascular surgery interventions include bilateral CEAs, Z-fen for pararenal aortic aneurysm has been followed in our clinic after having repair done at [facility].  Patient was initially at [facility], and was emergently transferred for operative intervention to [facility].  He was seen in preoperative holding and taken immediately to the operating room.  He had no questions, was in distress  Past Medical History:  Diagnosis Date   Adenomatous colon polyp    Aortic aneurysm (HCC)    BPH (benign prostatic hypertrophy)    CAD (coronary artery disease)    Carotid artery stenosis    a. Bilateral CEA   Cataract    CHF (congestive heart failure) (HCC)    Chronic systolic heart failure (HCC)    a. EF 20-25%, mild LVH, mod HK, mid apicalanteroseptal myocardium, mild MR, LA mod dilated   Collagen vascular disease (HCC)    COPD (chronic obstructive pulmonary disease) (HCC)    Coronary artery disease    a. LHC (08/2013): Lmain: short 30% distal, LAD: sml D1 & D2, 70% ostial D1, 95-99% LAD stenosis prox D2 LCx: sml/mod ramus subtot. occluded, 40% ostial set off lg OM1, 40% AV LCx after OM1, RCA: 90% prox (DES to RCA and prox LAD)   Diverticulosis    GERD (gastroesophageal reflux disease)    Heart murmur    History of colonic polyps    Hyperlipidemia    Hyperplastic colon polyp    Hypertension    Ischemic cardiomyopathy    RLS (restless legs syndrome)     Past Surgical History:  Procedure Laterality Date   BUBBLE STUDY  02/03/2022   Procedure: BUBBLE STUDY;  Surgeon: Meriam Sprague, MD;  Location: [facility] ENDOSCOPY;  Service: Cardiovascular;;   cardiac stents  09-2013   CAROTID ENDARTERECTOMY  04/17/2008   right   CAROTID ENDARTERECTOMY  05/30/08   Left   CATARACT  EXTRACTION W/PHACO Right 10/05/2018   Procedure: CATARACT EXTRACTION PHACO AND INTRAOCULAR LENS PLACEMENT (IOC), RIGHT;  Surgeon: Galen Manila, MD;  Location: [facility] ORS;  Service: Ophthalmology;  Laterality: Right;  [place] 01:06.4 cde 12.79 Fluid Pack Lot # [id number]   CATARACT EXTRACTION W/PHACO Left 11/02/2018   Procedure: CATARACT EXTRACTION PHACO AND INTRAOCULAR LENS PLACEMENT (IOC), LEFT;  Surgeon: Galen Manila, MD;  Location: [facility] ORS;  Service: Ophthalmology;  Laterality: Left;  [place]  01:10 CDE 11.52 Fluid pack lot # [id number]   CHOLECYSTECTOMY     Gall Bladder   CORONARY ANGIOGRAM  09/11/2013   Procedure: CORONARY ANGIOGRAM;  Surgeon: Lennette Bihari, MD;  Location: [facility] CATH LAB;  Service: Cardiovascular;;   CORONARY ANGIOPLASTY     STENTS   HERNIA REPAIR     lower aorta aneurysm  05/26/2015   [facility]   PERCUTANEOUS STENT INTERVENTION  09/11/2013   Procedure: PERCUTANEOUS STENT INTERVENTION;  Surgeon: Lennette Bihari, MD;  Location: [facility] CATH LAB;  Service: Cardiovascular;;  DES Prox RCA 3.5x15 xience    TEE WITHOUT CARDIOVERSION N/A 02/03/2022   Procedure: TRANSESOPHAGEAL ECHOCARDIOGRAM (TEE);  Surgeon: Meriam Sprague, MD;  Location: [facility] ENDOSCOPY;  Service: Cardiovascular;  Laterality: N/A;    Allergies  Allergen Reactions   Codeine Anaphylaxis and Swelling    throat swells    Prior to Admission medications   Medication Sig Start Date  End Date Taking? Authorizing Provider  acetaminophen (TYLENOL) 500 MG tablet Take 1,000 mg by mouth every 6 (six) hours as needed for moderate pain (pain score 4-6).   Yes [provider]  aspirin EC 81 MG tablet Take 81 mg by mouth every evening.    Yes [provider]  clopidogrel (PLAVIX) 75 MG tablet TAKE 1 TABLET BY MOUTH EVERY DAY 11/03/22  Yes Laurey Morale, MD  cyanocobalamin (VITAMIN B12) 1000 MCG tablet Take 1,000 mcg by mouth daily.   Yes [provider]  ezetimibe-simvastatin (VYTORIN) 10-40 MG tablet TAKE  1 TABLET BY MOUTH EVERYDAY AT BEDTIME 10/06/22  Yes Bedsole, Amy E, MD  famotidine (PEPCID) 40 MG tablet Take 40 mg by mouth 2 (two) times daily.   Yes [provider]  FARXIGA 10 MG TABS tablet TAKE 1 TABLET BY MOUTH DAILY BEFORE BREAKFAST. 11/29/22  Yes Laurey Morale, MD  ferrous sulfate 325 (65 FE) MG tablet Take 325 mg by mouth daily with breakfast.   Yes [provider]  gabapentin (NEURONTIN) 100 MG capsule Take 1 capsule (100 mg total) by mouth 3 (three) times daily. Patient taking differently: Take 100-200 mg by mouth See admin instructions. Take 100mg  (1 capsule) by mouth every morning and 200mg  (2 capsules) at night. 03/29/23  Yes Bedsole, Amy E, MD  methocarbamol (ROBAXIN) 500 MG tablet TAKE 1 TABLET (500 MG TOTAL) BY MOUTH EVERY DAY AT BEDTIME AS NEEDED FOR MUSCLE SPASM 06/09/23  Yes Bedsole, Amy E, MD  metoprolol succinate (TOPROL-XL) 50 MG 24 hr tablet Take 1 tablet (50 mg total) by mouth daily. 03/29/23  Yes Laurey Morale, MD  mirtazapine (REMERON) 15 MG tablet TAKE 1 TABLET BY MOUTH EVERYDAY AT BEDTIME 03/07/23  Yes Bedsole, Amy E, MD  Omega-3 Fatty Acids (FISH OIL) 1000 MG CAPS Take 1 capsule by mouth daily.   Yes [provider]  rOPINIRole (REQUIP) 3 MG tablet TAKE 1/2 TABLET BY MOUTH IN THE MORNING, 1/2 TABLET MID DAY & TAKE 1 TABLET AT NIGHT 02/08/23  Yes Bedsole, Amy E, MD  spironolactone (ALDACTONE) 25 MG tablet TAKE 1/2 TABLET BY MOUTH DAILY 03/29/23  Yes Laurey Morale, MD  tamsulosin (FLOMAX) 0.4 MG CAPS capsule TAKE 1 CAPSULE BY MOUTH EVERY DAY 09/08/22  Yes Bedsole, Amy E, MD    Social History   Socioeconomic History   Marital status: Married    Spouse name: Not on file   Number of children: Not on file   Years of education: Not on file   Highest education level: Not on file  Occupational History   Occupation: Retired Radiographer, therapeutic  Tobacco Use   Smoking status: Every Day    Current packs/day: 1.00    Average packs/day: 1 pack/day for 50.0  years (50.0 ttl pk-yrs)    Types: Cigarettes    Passive exposure: Never   Smokeless tobacco: Current   Tobacco comments:    off and on since age 41, currently smokes a pack a day [facility] 12/23/20  Vaping Use   Vaping status: Never Used  Substance and Sexual Activity   Alcohol use: No   Drug use: No   Sexual activity: Yes  Other Topics Concern   Not on file  Social History Narrative   Lives with wife in [place]. Retired from the [employer]   Social Drivers of Corporate investment banker Strain: Low Risk  (04/29/2021)   Overall Financial Resource Strain (CARDIA)    Difficulty of Paying Living  Expenses: Not very hard  Food Insecurity: No Food Insecurity (06/20/2023)   Hunger Vital Sign    Worried About Running Out of Food in the Last Year: Never true    Ran Out of Food in the Last Year: Never true  Transportation Needs: No Transportation Needs (06/20/2023)   PRAPARE - Administrator, Civil Service (Medical): No    Lack of Transportation (Non-Medical): No  Physical Activity: Inactive (05/31/2019)   Exercise Vital Sign    Days of Exercise per Week: 0 days    Minutes of Exercise per Session: 0 min  Stress: No Stress Concern Present (05/31/2019)   [employer] of Occupational Health - Occupational Stress Questionnaire    Feeling of Stress : Not at all  Social Connections: Not on file  Intimate Partner Violence: Not At Risk (06/20/2023)   Humiliation, Afraid, Rape, and Kick questionnaire    Fear of Current or Ex-Partner: No    Emotionally Abused: No    Physically Abused: No    Sexually Abused: No   Family History  Problem Relation Age of Onset   Heart disease Brother    Hyperlipidemia Brother    Pancreatic cancer Brother    Hypertension Mother    Cancer Father        unknown type; sounds GI   Emphysema Father    Hyperlipidemia Brother    Hyperlipidemia Brother    Heart attack Brother    Hyperlipidemia Brother    Multiple sclerosis Brother    Kidney disease  Neg Hx    Prostate cancer Neg Hx    Colon cancer Neg Hx    Esophageal cancer Neg Hx    Rectal cancer Neg Hx    Stomach cancer Neg Hx     ROS: Otherwise negative unless mentioned in HPI  Physical Examination  Vitals:   06/20/23 0849 06/20/23 1204  BP: (!) 144/69 (!) 142/68  Pulse:  (!) 101  Resp:  18  Temp:  (!) 97.5 F (36.4 C)  SpO2:  96%   Body mass index is 18.08 kg/m.  General:  WDWN in NAD Gait: Not observed HENT: WNL, normocephalic Pulmonary: normal non-labored breathing, without Rales, rhonchi,  wheezing Cardiac: regular Abdomen: soft, NT/ND, no masses Skin: without rashes Vascular Exam/Pulses: not checked Extremities: without ischemic changes, without Gangrene , without cellulitis; without open wounds;  Musculoskeletal: no muscle wasting or atrophy  Neurologic: A&O X 3;  No focal weakness or paresthesias are detected; speech is fluent/normal Psychiatric:  The pt has Normal affect. Lymph:  Unremarkable  CBC    Component Value Date/Time   WBC 20.2 (H) 06/20/2023 0726   RBC 4.02 (L) 06/20/2023 0726   RBC 4.02 (L) 06/20/2023 0726   HGB 10.9 (L) 06/20/2023 0726   HGB 10.9 (L) 05/25/2023 1032   HCT 36.8 (L) 06/20/2023 0726   PLT 363 06/20/2023 0726   PLT 303 05/25/2023 1032   MCV 91.5 06/20/2023 0726   MCH 27.1 06/20/2023 0726   MCHC 29.6 (L) 06/20/2023 0726   RDW 15.9 (H) 06/20/2023 0726   LYMPHSABS 0.3 (L) 06/20/2023 0726   MONOABS 1.3 (H) 06/20/2023 0726   EOSABS 0.0 06/20/2023 0726   BASOSABS 0.1 06/20/2023 0726    BMET    Component Value Date/Time   NA 137 06/20/2023 0726   NA 138 08/18/2018 1135   K 3.9 06/20/2023 0726   CL 101 06/20/2023 0726   CO2 20 (L) 06/20/2023 0726   GLUCOSE 168 (H) 06/20/2023 0726  BUN 28 (H) 06/20/2023 0726   BUN 19 08/18/2018 1135   CREATININE 1.27 (H) 06/20/2023 0726   CREATININE 1.11 10/09/2021 1646   CALCIUM 9.0 06/20/2023 0726   GFRNONAA 56 (L) 06/20/2023 0726   GFRAA 95 08/18/2018 1135    COAGS: Lab  Results  Component Value Date   INR 1.1 02/21/2023   INR 1.3 (H) 02/15/2022   INR 1.3 (H) 02/03/2022     ASSESSMENT/PLAN: This is a 83 y.o. 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  He was emergently sent over to [facility] for intervention.  Plan will be for diagnostic laparoscopy with my general surgery colleagues followed by likely stenting as the superior mesenteric artery appears severely stenotic.  This was discussed with the patient and his family.  Arlester Marker is aware that this comes at high risk of mortality.  After discussing risk benefits, they elected to proceed.   [name] MD [facility] [phone] 06/20/2023  2:32 PM

## 2023-06-20 NOTE — TOC Initial Note (Signed)
 Transition of Care (TOC) - Initial/Assessment Note    Patient Details  Name: Erik [name]. MRN: [id number] Date of Birth: May 18, 1940  Transition of Care [facility]) CM/SW Contact:    Howell Rucks, RN Phone Number: 06/20/2023, 10:48 AM  Clinical Narrative:  Met with patient and spouse at bedside to introduce role of TOC/NCM and review for dc planning, spouse answered initial assessment questions, confirmed pt has an established PCP and pharmacy, no current home care services, home DME: walker, cane, walk in shower. TOC will continue to follow.                   Expected Discharge Plan: Home w Home Health Services Barriers to Discharge: Continued Medical Work up   Patient Goals and CMS Choice Patient states their goals for this hospitalization and ongoing recovery are:: rerturn home          Expected Discharge Plan and Services       Living arrangements for the past 2 months: Single Family Home                                      Prior Living Arrangements/Services Living arrangements for the past 2 months: Single Family Home Lives with:: Spouse Patient language and need for interpreter reviewed:: Yes Do you feel safe going back to the place where you live?: Yes      Need for Family Participation in Patient Care: Yes (Comment) Care giver support system in place?: Yes (comment) Current home services: DME (walker, cane, walk in shower) Criminal Activity/Legal Involvement Pertinent to Current Situation/Hospitalization: No - Comment as needed  Activities of Daily Living   ADL Screening (condition at time of admission) Independently performs ADLs?: Yes (appropriate for developmental age) Is the patient deaf or have difficulty hearing?: No Does the patient have difficulty seeing, even when wearing glasses/contacts?: No Does the patient have difficulty concentrating, remembering, or making decisions?: No  Permission Sought/Granted                   Emotional Assessment Appearance:: Appears stated age Attitude/Demeanor/Rapport: Gracious Affect (typically observed): Accepting Orientation: : Oriented to Self, Oriented to Place, Oriented to  Time, Oriented to Situation Alcohol / Substance Use: Not Applicable Psych Involvement: No (comment)  Admission diagnosis:  SBO (small bowel obstruction) (HCC) [K56.609] Abdominal pain [R10.9] Patient Active Problem List   Diagnosis Date Noted   Anemia 06/20/2023   SBO (small bowel obstruction) (HCC) 06/20/2023   Abdominal pain 06/19/2023   CAP (community acquired pneumonia) 02/21/2023   Acute cough 09/27/2022   Abnormal CT scan 08/26/2022   Iron deficiency anemia due to chronic blood loss 06/23/2022   MSSA bacteremia 02/03/2022   Protein-calorie malnutrition, severe 02/01/2022   Iron deficiency anemia secondary to inadequate dietary iron intake 11/23/2021   Family history of B12 deficiency 11/23/2021   Hydrocele in adult 09/04/2020   Bronchiectasis (HCC) 10/24/2019   Restless leg syndrome 05/30/2018   Abdominal aortic aneurysm (AAA) (HCC) 07/07/2015   Arteriosclerosis of coronary artery 07/07/2015   Cardiomyopathy, ischemic 07/07/2015   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   Solitary pulmonary nodule 04/16/2014   HTN (hypertension) 10/03/2013   CAD S/P percutaneous coronary angioplasty 09/19/2013   Chronic systolic heart failure (HCC) 09/19/2013   NSTEMI (non-ST elevated myocardial infarction) (HCC) 09/10/2013   Family history of coronary artery disease in brother 09/10/2013   ED (  erectile dysfunction) 12/10/2011   COPD, mild (HCC) 06/20/2008   COLONIC POLYPS, ADENOMATOUS 06/05/2008   BPH (benign prostatic hyperplasia) 06/05/2008   Prediabetes 05/14/2008   VENEREAL WART 05/07/2008   ONYCHOMYCOSIS, TOENAILS 05/07/2008   Hyperlipidemia 05/07/2008   Bilateral carotid artery stenosis 05/07/2008   INTERMITTENT CLAUDICATION 05/07/2008   HEART MURMUR, HX OF  05/07/2008   History of colonic polyps 05/07/2008   PCP:  Excell Seltzer, MD Pharmacy:   [facility] [phone] - [address] [place] - [address] [place] [address] Phone: [phone] Fax: [phone]  [facility] [address] [place] [address] Phone: [phone] Fax: [phone]     Social Drivers of Health (SDOH) Social History: SDOH Screenings   Food Insecurity: No Food Insecurity (06/20/2023)  Housing: Low Risk  (06/20/2023)  Transportation Needs: No Transportation Needs (06/20/2023)  Utilities: Not At Risk (06/20/2023)  Alcohol Screen: Low Risk  (05/31/2019)  Depression (PHQ2-9): Low Risk  (09/27/2022)  Financial Resource Strain: Low Risk  (04/29/2021)  Physical Activity: Inactive (05/31/2019)  Stress: No Stress Concern Present (05/31/2019)  Tobacco Use: High Risk (06/20/2023)   SDOH Interventions:     Readmission Risk Interventions    02/17/2022    4:21 PM  Readmission Risk Prevention Plan  Transportation Screening Complete  PCP or Specialist Appt within 3-5 Days Complete  HRI or Home Care Consult Complete  Social Work Consult for Recovery Care Planning/Counseling Complete  Palliative Care Screening Complete  Medication Review Oceanographer) Complete

## 2023-06-20 NOTE — Consult Note (Signed)
 NAME:  Erik [name]., MRN:  [id number], DOB:  03-15-1941, LOS: 0 ADMISSION DATE:  06/19/2023, CONSULTATION DATE:  4/7 REFERRING MD:  Gavin Pound, CHIEF COMPLAINT:  post op vent rx    History of Present Illness:  83 year old male patient with a known history of severe coronary artery disease with prior coronary artery stenting as well as AAA repair complicated further by heart failure with reduced EF COPD and tobacco abuse presented to the emergency room at [facility] on 4/6 with 2-day history of progressive abdominal pain which had been ongoing and intermittent over 2 to 3 months but acutely worse.  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  Returns to the intensive care on mechanical ventilator critical care asked to evaluate.  Pertinent  Medical History  CAD s/p PCI chronic systolic HF AAA s/p remote repair, COPD, tobacco abuse, severe PAD, bilateral CEAs  Significant Hospital Events: Including procedures, antibiotic start and stop dates in addition to other pertinent events   4/6 admitted with acute abdominal pain 4/7 status post exploratory laparotomy lysis of adhesion as well as mesenteric angiogram with insertion of stent to superior mesenteric artery of the abdomen.  Bowel was without evidence of ischemia  Interim History / Subjective:  Return to the intensive care sedated on propofol infusion  Objective   Blood pressure (!) 142/68, pulse 94, temperature 98.4 F (36.9 C), temperature source Oral, resp. rate 16, height 5\' 6"  (1.676 m), weight 50.8 kg, SpO2 99%.    Vent Mode: PRVC FiO2 (%):  [60 %] 60 % Set Rate:  [16 bmp] 16 bmp Vt Set:  [510 mL] 510 mL PEEP:  [5  cmH20] 5 cmH20 Plateau Pressure:  [17 cmH20] 17 cmH20   Intake/Output Summary (Last 24 hours) at 06/20/2023 1819 Last data filed at 06/20/2023 1732 Gross per 24 hour  Intake 5380 ml  Output 830 ml  Net 4550 ml   Filed Weights   06/19/23 1836  Weight: 50.8 kg    Examination: General: Frail 83 year old white male sedated on propofol HENT: Normocephalic atraumatic sclera nonicteric pupils equal reactive orally intubated Lungs: Clear to auscultation bilaterally Cardiovascular: Regular rate and rhythm Abdomen: Soft hypoactive bowel sounds postsurgical incisions clean dry and intact Extremities: Cool pulses palpable cap refill less than 3 seconds Neuro: Heavily sedated GU: Clear yellow urine  Resolved Hospital Problem list     Assessment & Plan:  Ischemic bowel with lactic acidosis now status post lysis of adhesions and stent to SMA by vascular surgery Plan N.p.o. As needed analgesia Follow-up lactate Resuming dual antiplatelet therapy postoperatively vascular surgery Follow-up postop CBC Day 1 empiric Zosyn Continuing gentle IV hydration  Ventilator management underlying COPD and tobacco abuse Plan Scheduled bronchodilators VAP bundle  Abg PAD protocol RASS goal -1 Continuing propofol with as needed Dilaudid Chest x-ray now Anticipate SBT in a.m.  H/o HFmrEF  Plan Continue telemetry monitoring Resume Toprol when able Holding spironolactone for now Holding farxiga   History of severe peripheral vascular disease with prior AAA repair, as well as bilateral CEA, Plan Resuming dual antiplatelet therapy postop  Leukocytosis.  Possible bacterial translocation Plan Follow-up cultures Zosyn day 1  Hyperlipidemia Plan Resume Vytorin postoperatively when okay with surgical team  BPH Plan Keeping Foley in place for now Resume Flomax when able   Best Practice (right click and "Reselect all SmartList Selections" daily)   Best Practice (right click and "Reselect  all SmartList Selections" daily)   Diet/type: NPO DVT prophylaxis: SCDs Start: 06/20/23 0348   Pressure ulcers present: N/A GI prophylaxis: PPI Lines: Central line Foley:  Yes, and it is still needed Code Status:  full code Last date of multidisciplinary goals of care discussion [pending]     Labs   CBC: Recent Labs  Lab 06/19/23 2000 06/20/23 0726  WBC 9.2 20.2*  NEUTROABS  --  18.5*  HGB 9.8* 10.9*  HCT 31.0* 36.8*  MCV 87.6 91.5  PLT 280 363    Basic Metabolic Panel: Recent Labs  Lab 06/19/23 2000 06/20/23 0726  NA 135 137  K 4.2 3.9  CL 103 101  CO2 23 20*  GLUCOSE 104* 168*  BUN 22 28*  CREATININE 0.98 1.27*  CALCIUM 8.8* 9.0   GFR: Estimated Creatinine Clearance: 32.2 mL/min (A) (by C-G formula based on SCr of 1.27 mg/dL (H)). Recent Labs  Lab 06/19/23 2000 06/20/23 0726 06/20/23 1025 06/20/23 1306  WBC 9.2 20.2*  --   --   LATICACIDVEN  --  6.2* 4.8* 7.4*    Liver Function Tests: Recent Labs  Lab 06/20/23 0558  AST 35  ALT 14  ALKPHOS 78  BILITOT 0.6  PROT 8.4*  ALBUMIN 3.5   No results for input(s): "LIPASE", "AMYLASE" in the last 168 hours. No results for input(s): "AMMONIA" in the last 168 hours.  ABG    Component Value Date/Time   HCO3 23.1 09/10/2013 0220   TCO2 24 09/10/2013 0220   ACIDBASEDEF 1.0 09/10/2013 0220   O2SAT 78.0 09/10/2013 0220     Coagulation Profile: No results for input(s): "INR", "PROTIME" in the last 168 hours.  Cardiac Enzymes: No results for input(s): "CKTOTAL", "CKMB", "CKMBINDEX", "TROPONINI" in the last 168 hours.  HbA1C: Hgb A1c MFr Bld  Date/Time Value Ref Range Status  08/19/2022 08:19 AM 4.5 (L) 4.6 - 6.5 % Final    Comment:    Glycemic Control Guidelines for People with Diabetes:Non Diabetic:  <6%Goal of Therapy: <7%Additional Action Suggested:  >8%   07/07/2021 09:51 AM 6.0 4.6 - 6.5 % Final    Comment:    Glycemic Control Guidelines for People with Diabetes:Non Diabetic:  <6%Goal of  Therapy: <7%Additional Action Suggested:  >8%     CBG: Recent Labs  Lab 06/20/23 1734  GLUCAP 124*    Review of Systems:   Not able   Past Medical History:  He,  has a past medical history of Adenomatous colon polyp, Aortic aneurysm (HCC), BPH (benign prostatic hypertrophy), CAD (coronary artery disease), Carotid artery stenosis, Cataract, CHF (congestive heart failure) (HCC), Chronic systolic heart failure (HCC), Collagen vascular disease (HCC), COPD (chronic obstructive pulmonary disease) (HCC), Coronary artery disease, Diverticulosis, GERD (gastroesophageal reflux disease), Heart murmur, History of colonic polyps, Hyperlipidemia, Hyperplastic colon polyp, Hypertension, Ischemic cardiomyopathy, and RLS (restless legs syndrome).   Surgical History:   Past Surgical History:  Procedure Laterality Date   BUBBLE STUDY  02/03/2022   Procedure: BUBBLE STUDY;  Surgeon: Meriam Sprague, MD;  Location: [facility] ENDOSCOPY;  Service: Cardiovascular;;   cardiac stents  09-2013   CAROTID ENDARTERECTOMY  04/17/2008   right   CAROTID ENDARTERECTOMY  05/30/08   Left   CATARACT EXTRACTION W/PHACO Right 10/05/2018   Procedure: CATARACT EXTRACTION PHACO AND INTRAOCULAR LENS PLACEMENT (IOC),  RIGHT;  Surgeon: Galen Manila, MD;  Location: [facility] ORS;  Service: Ophthalmology;  Laterality: Right;  [place] 01:06.4 cde 12.79 Fluid Pack Lot # [id number]   CATARACT EXTRACTION W/PHACO Left 11/02/2018   Procedure: CATARACT EXTRACTION PHACO AND INTRAOCULAR LENS PLACEMENT (IOC), LEFT;  Surgeon: Galen Manila, MD;  Location: [facility] ORS;  Service: Ophthalmology;  Laterality: Left;  [place]  01:10 CDE 11.52 Fluid pack lot # [id number]   CHOLECYSTECTOMY     Gall Bladder   CORONARY ANGIOGRAM  09/11/2013   Procedure: CORONARY ANGIOGRAM;  Surgeon: Lennette Bihari, MD;  Location: [facility] CATH LAB;  Service: Cardiovascular;;   CORONARY ANGIOPLASTY     STENTS   HERNIA REPAIR     lower aorta aneurysm  05/26/2015   [facility]   PERCUTANEOUS  STENT INTERVENTION  09/11/2013   Procedure: PERCUTANEOUS STENT INTERVENTION;  Surgeon: Lennette Bihari, MD;  Location: [facility] CATH LAB;  Service: Cardiovascular;;  DES Prox RCA 3.5x15 xience    TEE WITHOUT CARDIOVERSION N/A 02/03/2022   Procedure: TRANSESOPHAGEAL ECHOCARDIOGRAM (TEE);  Surgeon: Meriam Sprague, MD;  Location: [facility] ENDOSCOPY;  Service: Cardiovascular;  Laterality: N/A;     Social History:   reports that he has been smoking cigarettes. He has a 50 pack-year smoking history. He has never been exposed to tobacco smoke. He uses smokeless tobacco. He reports that he does not drink alcohol and does not use drugs.   Family History:  His family history includes Cancer in his father; Emphysema in his father; Heart attack in his brother; Heart disease in his brother; Hyperlipidemia in his brother, brother, brother, and brother; Hypertension in his mother; Multiple sclerosis in his brother; Pancreatic cancer in his brother. There is no history of Kidney disease, Prostate cancer, Colon cancer, Esophageal cancer, Rectal cancer, or Stomach cancer.   Allergies Allergies  Allergen Reactions   Codeine Anaphylaxis and Swelling    throat swells     Home Medications  Prior to Admission medications   Medication Sig Start Date End Date Taking? Authorizing Provider  acetaminophen (TYLENOL) 500 MG tablet Take 1,000 mg by mouth every 6 (six) hours as needed for moderate pain (pain score 4-6).   Yes [provider]  aspirin EC 81 MG tablet Take 81 mg by mouth every evening.    Yes [provider]  clopidogrel (PLAVIX) 75 MG tablet TAKE 1 TABLET BY MOUTH EVERY DAY 11/03/22  Yes Laurey Morale, MD  cyanocobalamin (VITAMIN B12) 1000 MCG tablet Take 1,000 mcg by mouth daily.   Yes [provider]  ezetimibe-simvastatin (VYTORIN) 10-40 MG tablet TAKE 1 TABLET BY MOUTH EVERYDAY AT BEDTIME 10/06/22  Yes Bedsole, Amy E, MD  famotidine (PEPCID) 40 MG tablet Take 40 mg by mouth 2  (two) times daily.   Yes [provider]  FARXIGA 10 MG TABS tablet TAKE 1 TABLET BY MOUTH DAILY BEFORE BREAKFAST. 11/29/22  Yes Laurey Morale, MD  ferrous sulfate 325 (65 FE) MG tablet Take 325 mg by mouth daily with breakfast.   Yes [provider]  gabapentin (NEURONTIN) 100 MG capsule Take 1 capsule (100 mg total) by mouth 3 (three) times daily. Patient taking differently: Take 100-200 mg by mouth See admin instructions. Take 100mg  (1 capsule) by mouth every morning and 200mg  (2 capsules) at night. 03/29/23  Yes Bedsole, Amy E, MD  methocarbamol (ROBAXIN) 500 MG tablet TAKE 1 TABLET (500 MG TOTAL) BY MOUTH EVERY DAY AT BEDTIME AS NEEDED FOR MUSCLE SPASM 06/09/23  Yes Bedsole, Amy  E, MD  metoprolol succinate (TOPROL-XL) 50 MG 24 hr tablet Take 1 tablet (50 mg total) by mouth daily. 03/29/23  Yes Laurey Morale, MD  mirtazapine (REMERON) 15 MG tablet TAKE 1 TABLET BY MOUTH EVERYDAY AT BEDTIME 03/07/23  Yes Bedsole, Amy E, MD  Omega-3 Fatty Acids (FISH OIL) 1000 MG CAPS Take 1 capsule by mouth daily.   Yes [provider]  rOPINIRole (REQUIP) 3 MG tablet TAKE 1/2 TABLET BY MOUTH IN THE MORNING, 1/2 TABLET MID DAY & TAKE 1 TABLET AT NIGHT 02/08/23  Yes Bedsole, Amy E, MD  spironolactone (ALDACTONE) 25 MG tablet TAKE 1/2 TABLET BY MOUTH DAILY 03/29/23  Yes Laurey Morale, MD  tamsulosin (FLOMAX) 0.4 MG CAPS capsule TAKE 1 CAPSULE BY MOUTH EVERY DAY 09/08/22  Yes Bedsole, Amy E, MD     Critical care time: 32 min

## 2023-06-20 NOTE — Plan of Care (Signed)
   Problem: Coping: Goal: Level of anxiety will decrease Outcome: Progressing   Problem: Pain Managment: Goal: General experience of comfort will improve and/or be controlled Outcome: Progressing   Problem: Safety: Goal: Ability to remain free from injury will improve Outcome: Progressing

## 2023-06-20 NOTE — Anesthesia Procedure Notes (Signed)
 Arterial Line Insertion Start/End4/09/2023 1:45 PM, 06/20/2023 1:55 PM Performed by: Gaynelle Adu, MD, anesthesiologist  Patient location: Pre-op. Preanesthetic checklist: patient identified, IV checked, site marked, risks and benefits discussed, surgical consent, monitors and equipment checked, pre-op evaluation, timeout performed and anesthesia consent Lidocaine 1% used for infiltration Left, brachial was placed Catheter size: 20 G Hand hygiene performed , maximum sterile barriers used  and Seldinger technique used  Attempts: 1 Procedure performed using ultrasound guided technique. Ultrasound Notes:anatomy identified, needle tip was noted to be adjacent to the nerve/plexus identified, no ultrasound evidence of intravascular and/or intraneural injection and image(s) printed for medical record Following insertion, dressing applied, Biopatch and line sutured. Post procedure assessment: normal and unchanged  Post procedure complications: unsuccessful attempts and second provider assisted. Patient tolerated the procedure well with no immediate complications.

## 2023-06-21 ENCOUNTER — Encounter (HOSPITAL_COMMUNITY): Payer: Self-pay | Admitting: General Surgery

## 2023-06-21 DIAGNOSIS — Z720 Tobacco use: Secondary | ICD-10-CM | POA: Diagnosis not present

## 2023-06-21 DIAGNOSIS — J449 Chronic obstructive pulmonary disease, unspecified: Secondary | ICD-10-CM | POA: Diagnosis not present

## 2023-06-21 DIAGNOSIS — K55049 Acute infarction of large intestine, extent unspecified: Secondary | ICD-10-CM | POA: Diagnosis not present

## 2023-06-21 DIAGNOSIS — Z9911 Dependence on respirator [ventilator] status: Secondary | ICD-10-CM

## 2023-06-21 LAB — MAGNESIUM: Magnesium: 1.8 mg/dL (ref 1.7–2.4)

## 2023-06-21 LAB — CBC
HCT: 37.7 % — ABNORMAL LOW (ref 39.0–52.0)
Hemoglobin: 12.3 g/dL — ABNORMAL LOW (ref 13.0–17.0)
MCH: 28.4 pg (ref 26.0–34.0)
MCHC: 32.6 g/dL (ref 30.0–36.0)
MCV: 87.1 fL (ref 80.0–100.0)
Platelets: 210 10*3/uL (ref 150–400)
RBC: 4.33 MIL/uL (ref 4.22–5.81)
RDW: 15.9 % — ABNORMAL HIGH (ref 11.5–15.5)
WBC: 3.3 10*3/uL — ABNORMAL LOW (ref 4.0–10.5)
nRBC: 0 % (ref 0.0–0.2)

## 2023-06-21 LAB — GLUCOSE, CAPILLARY
Glucose-Capillary: 103 mg/dL — ABNORMAL HIGH (ref 70–99)
Glucose-Capillary: 120 mg/dL — ABNORMAL HIGH (ref 70–99)
Glucose-Capillary: 130 mg/dL — ABNORMAL HIGH (ref 70–99)
Glucose-Capillary: 88 mg/dL (ref 70–99)
Glucose-Capillary: 90 mg/dL (ref 70–99)
Glucose-Capillary: 98 mg/dL (ref 70–99)

## 2023-06-21 LAB — TYPE AND SCREEN
ABO/RH(D): A POS
Antibody Screen: NEGATIVE
Unit division: 0
Unit division: 0
Unit division: 0
Unit division: 0

## 2023-06-21 LAB — BASIC METABOLIC PANEL WITH GFR
Anion gap: 9 (ref 5–15)
BUN: 39 mg/dL — ABNORMAL HIGH (ref 8–23)
CO2: 18 mmol/L — ABNORMAL LOW (ref 22–32)
Calcium: 7.8 mg/dL — ABNORMAL LOW (ref 8.9–10.3)
Chloride: 113 mmol/L — ABNORMAL HIGH (ref 98–111)
Creatinine, Ser: 1.3 mg/dL — ABNORMAL HIGH (ref 0.61–1.24)
GFR, Estimated: 55 mL/min — ABNORMAL LOW (ref >60)
Glucose, Bld: 117 mg/dL — ABNORMAL HIGH (ref 70–99)
Potassium: 4.3 mmol/L (ref 3.5–5.1)
Sodium: 140 mmol/L (ref 135–145)

## 2023-06-21 LAB — LIPID PANEL
Cholesterol: 55 mg/dL (ref 0–200)
HDL: 36 mg/dL — ABNORMAL LOW (ref >40)
LDL Cholesterol: 8 mg/dL (ref 0–99)
Total CHOL/HDL Ratio: 1.5 ratio
Triglycerides: 54 mg/dL (ref <150)
VLDL: 11 mg/dL (ref 0–40)

## 2023-06-21 LAB — BPAM RBC
Blood Product Expiration Date: 202504142359
Blood Product Expiration Date: 202504142359
Blood Product Expiration Date: 202504142359
Blood Product Expiration Date: 202504142359
ISSUE DATE / TIME: 202504071611
ISSUE DATE / TIME: 202504071611
ISSUE DATE / TIME: 202504071611
ISSUE DATE / TIME: 202504071611
Unit Type and Rh: 6200
Unit Type and Rh: 6200
Unit Type and Rh: 6200
Unit Type and Rh: 6200

## 2023-06-21 LAB — BLOOD GAS, ARTERIAL
Acid-base deficit: 6 mmol/L — ABNORMAL HIGH (ref 0.0–2.0)
Bicarbonate: 20.7 mmol/L (ref 20.0–28.0)
O2 Saturation: 99.4 %
Patient temperature: 36.3
pCO2 arterial: 43 mmHg (ref 32–48)
pH, Arterial: 7.29 — ABNORMAL LOW (ref 7.35–7.45)
pO2, Arterial: 119 mmHg — ABNORMAL HIGH (ref 83–108)

## 2023-06-21 LAB — BETA-HYDROXYBUTYRIC ACID: Beta-Hydroxybutyric Acid: 0.17 mmol/L (ref 0.05–0.27)

## 2023-06-21 LAB — HEMOGLOBIN AND HEMATOCRIT, BLOOD
HCT: 37.1 % — ABNORMAL LOW (ref 39.0–52.0)
HCT: 36.8 % — ABNORMAL LOW (ref 39.0–52.0)
Hemoglobin: 11.9 g/dL — ABNORMAL LOW (ref 13.0–17.0)
Hemoglobin: 11.7 g/dL — ABNORMAL LOW (ref 13.0–17.0)

## 2023-06-21 LAB — OCCULT BLOOD X 1 CARD TO LAB, STOOL: Fecal Occult Bld: POSITIVE — AB

## 2023-06-21 LAB — LACTIC ACID, PLASMA: Lactic Acid, Venous: 1.4 mmol/L (ref 0.5–1.9)

## 2023-06-21 LAB — PHOSPHORUS: Phosphorus: 4.2 mg/dL (ref 2.5–4.6)

## 2023-06-21 LAB — TRIGLYCERIDES: Triglycerides: 55 mg/dL (ref <150)

## 2023-06-21 MED ORDER — ASPIRIN 300 MG RE SUPP
150.0000 mg | Freq: Every day | RECTAL | Status: DC
  Administered 2023-06-21 – 2023-06-22 (×2): 150 mg via RECTAL
  Filled 2023-06-21 (×3): qty 1

## 2023-06-21 MED ORDER — ORAL CARE MOUTH RINSE: 15.0000 mL | OROMUCOSAL | Status: DC | PRN

## 2023-06-21 MED ORDER — PANTOPRAZOLE SODIUM 40 MG IV SOLR
40.0000 mg | Freq: Two times a day (BID) | INTRAVENOUS | Status: DC
  Administered 2023-06-21: 40 mg via INTRAVENOUS
  Filled 2023-06-21: qty 10

## 2023-06-21 MED ORDER — ACETAMINOPHEN 10 MG/ML IV SOLN
1000.0000 mg | Freq: Four times a day (QID) | INTRAVENOUS | Status: AC | PRN
  Administered 2023-06-21 – 2023-06-22 (×2): 1000 mg via INTRAVENOUS
  Filled 2023-06-21 (×2): qty 100

## 2023-06-21 MED ORDER — HYDROMORPHONE HCL 1 MG/ML IJ SOLN
0.2500 mg | INTRAMUSCULAR | Status: DC | PRN
  Filled 2023-06-21: qty 0.5

## 2023-06-21 MED ORDER — HYDROMORPHONE HCL 1 MG/ML IJ SOLN
0.5000 mg | INTRAMUSCULAR | Status: DC | PRN
  Administered 2023-06-21: 0.5 mg via INTRAVENOUS
  Filled 2023-06-21: qty 0.5

## 2023-06-21 MED ORDER — ORAL CARE MOUTH RINSE
15.0000 mL | OROMUCOSAL | Status: DC
  Administered 2023-06-21 (×5): 15 mL via OROMUCOSAL

## 2023-06-21 MED ORDER — HYDROMORPHONE HCL 1 MG/ML IJ SOLN
0.2500 mg | INTRAMUSCULAR | Status: AC | PRN
  Administered 2023-06-21 – 2023-06-22 (×2): 0.25 mg via INTRAVENOUS
  Filled 2023-06-21 (×2): qty 0.5

## 2023-06-21 NOTE — Progress Notes (Signed)
 Intubated, sedated.  Pain to palpation of the abdomen, distended more than previous  Labs relatively unremarkable  Assessment / plan:  Patient is postop day 1 SMA stenting for acute mesenteric ischemia.  Laparoscopy at the time of surgery demonstrated viable bowel.  I think abdominal pain is likely edematous bowel from reperfusion.  Will continue to follow. Please continue doing labs Please continue daily aspirin, Plavix Please continue NG tube to suction   Vent per CCM   Appreciate excellent care from multiple service lines.

## 2023-06-21 NOTE — Progress Notes (Signed)
 1 Day Post-Op  Subjective: CC: Seen w/ RN, PCCM, vascular Intubated and sedated (prop). Not on pressors currently. Art line pressures 130's/40's. Afebrile.  ETT adv overnight NGT placed to LIWS. 200cc output overnight.  Melena x 2 noted overnight. Fecal occult pending. SQH d/c'd. On plavix and rectal asa per vascular. Hgb 12.3 > 11.9.  UOP 0.9 ml/kg/hr over the last 12 hours.  CCM to take over as primary   Objective: Vital signs in last 24 hours: Temp:  [97.3 F (36.3 C)-98.4 F (36.9 C)] 97.3 F (36.3 C) (04/08 0018) Pulse Rate:  [86-101] 91 (04/08 0630) Resp:  [15-21] 21 (04/08 0630) BP: (142-144)/(68-69) 142/68 (04/07 1204) SpO2:  [96 %-100 %] 99 % (04/08 0630) Arterial Line BP: (112-157)/(35-47) 119/41 (04/08 0630) FiO2 (%):  [45 %-60 %] 45 % (04/08 0315) Last BM Date : 06/21/23  Intake/Output from previous day: 04/07 0701 - 04/08 0700 In: 5760.2 [I.V.:4295.8; Blood:630; NG/GT:40; IV Piggyback:794.4] Out: 1556 [Urine:1255; Emesis/NG output:200; Stool:1; Blood:100] Intake/Output this shift: Total I/O In: -  Out: 40 [Urine:40]  Vent Mode: PRVC FiO2 (%):  [45 %-60 %] 45 % Set Rate:  [16 bmp-18 bmp] 18 bmp Vt Set:  [510 mL] 510 mL PEEP:  [5 cmH20] 5 cmH20 Plateau Pressure:  [12 cmH20-19 cmH20] 15 cmH20  PE: Gen: Intubated and sedated Card:  Reg. DP palpable b/l Pulm: CTA b/l, on vent, settings as above Abd: Soft, moderate distension, gen ttp, hypoactive BS. Incisions w/ glue in place, cdi. R groin soft and inc cdi GU: foley in place with straw colored urine in foley bag.  Ext: No LE edema.   Lab Results:  Recent Labs    06/20/23 1846 06/21/23 0427 06/21/23 0620  WBC 10.8* 3.3*  --   HGB 11.4* 12.3* 11.9*  HCT 36.7* 37.7* 37.1*  PLT 222 210  --    BMET Recent Labs    06/20/23 0726 06/20/23 1846 06/21/23 0427  NA 137  --  140  K 3.9  --  4.3  CL 101  --  113*  CO2 20*  --  18*  GLUCOSE 168*  --  117*  BUN 28*  --  39*  CREATININE 1.27* 1.18  1.30*  CALCIUM 9.0  --  7.8*   PT/INR No results for input(s): "LABPROT", "INR" in the last 72 hours. CMP     Component Value Date/Time   NA 140 06/21/2023 0427   NA 138 08/18/2018 1135   K 4.3 06/21/2023 0427   CL 113 (H) 06/21/2023 0427   CO2 18 (L) 06/21/2023 0427   GLUCOSE 117 (H) 06/21/2023 0427   BUN 39 (H) 06/21/2023 0427   BUN 19 08/18/2018 1135   CREATININE 1.30 (H) 06/21/2023 0427   CREATININE 1.11 10/09/2021 1646   CALCIUM 7.8 (L) 06/21/2023 0427   PROT 8.4 (H) 06/20/2023 0558   ALBUMIN 3.5 06/20/2023 0558   AST 35 06/20/2023 0558   ALT 14 06/20/2023 0558   ALKPHOS 78 06/20/2023 0558   BILITOT 0.6 06/20/2023 0558   GFRNONAA 55 (L) 06/21/2023 0427   GFRAA 95 08/18/2018 1135   Lipase     Component Value Date/Time   LIPASE 61 (H) 03/09/2022 0812    Studies/Results: DG CHEST PORT 1 VIEW Result Date: 06/21/2023 CLINICAL DATA:  [id number] History of ETT [id number] EXAM: PORTABLE CHEST 1 VIEW COMPARISON:  Chest x-ray 06/20/2023 12:14 a.m. FINDINGS: Interval advancement of an endotracheal tube with tip terminating 6 cm above the carina. Enteric tube courses  below the diaphragm with tip and side port overlying the expected region of the gastric lumen. Right central venous catheter Cordis with tip overlying the expected region of the distal superior vena cava. The heart and mediastinal contours are unchanged. Prominent hilar vasculature. No focal consolidation. Persistent nodular interstitial markings. Blunting of left costophrenic angle with possible underlying trace pleural effusion. No pneumothorax. No acute osseous abnormality. Partially visualized abdominal aortic stent. IMPRESSION: 1. Pulmonary venous congestion. 2. Persistent nodular interstitial markings. 3. Lines and tubes as above. 4. Aortic Atherosclerosis (ICD10-I70.0) and Emphysema (ICD10-J43.9). Electronically Signed   By: Tish Frederickson M.D.   On: 06/21/2023 01:02   DG Chest Port 1 View Result Date: 06/21/2023 CLINICAL  DATA:  [id number] Respirator dependence [facility]) [id number] EXAM: PORTABLE CHEST 1 VIEW COMPARISON:  Chest x-ray 03/17/2023, CT chest 06/17/2022, CT abdomen pelvis 06/19/2023 FINDINGS: Endotracheal tube with tip terminating 7 cm above the carina. Enteric tube courses below the hemidiaphragm with tip and side port overlying the expected region of the gastric lumen. Right internal jugular central venous catheter Cordis with tip overlying the expected region of distal superior vena cava. The heart and mediastinal contours are unchanged. Atherosclerotic plaque. Prominent hilar vasculature. No focal consolidation. Persistent nodular interstitial markings. Question trace left pleural effusion. No right pleural effusion. No pneumothorax. No acute osseous abnormality. Partially visualized abdominal aortic stent. IMPRESSION: 1. Pulmonary venous congestion. 2. Persistent nodular interstitial markings. 3. Question trace left pleural effusion. 4. Aortic Atherosclerosis (ICD10-I70.0) and Emphysema (ICD10-J43.9). Electronically Signed   By: Tish Frederickson M.D.   On: 06/21/2023 00:14   DG Abd Portable 1V-Small Bowel Obstruction Protocol-initial, 8 hr delay Result Date: 06/20/2023 CLINICAL DATA:  Reason for exam: SBO 8 hr delay Note: Pt in OR during 8 hour mark: picture taken at 9.5hour delay EXAM: PORTABLE ABDOMEN - 1 VIEW COMPARISON:  CT abdomen pelvis 06/19/2023 FINDINGS: PO contrast likely within the colonic lumen. Gaseous dilatation of the large bowel. Surgical clips noted. Enteric tube courses below the hemidiaphragm with tip and side port overlying the expected region of the gastric lumen. Previously administered intravenous contrast noted partially opacifying the bilateral collecting systems. Aorto bi-iliac stent noted. Severe atherosclerotic plaque. Severe mitral annular calcification. IMPRESSION: 1. PO contrast likely within the colonic lumen. Recommend follow-up to further evaluate migration of PO contrast. 2.  Aortic  Atherosclerosis (ICD10-I70.0). Electronically Signed   By: Tish Frederickson M.D.   On: 06/20/2023 21:38   HYBRID OR IMAGING ([facility] ONLY) Result Date: 06/20/2023 There is no interpretation for this exam.  This order is for images obtained during a surgical procedure.  Please See "Surgeries" Tab for more information regarding the procedure.   DG Abd Portable 1V Result Date: 06/20/2023 CLINICAL DATA:  NG tube placement EXAM: PORTABLE ABDOMEN - 1 VIEW COMPARISON:  06/20/2023 FINDINGS: NG tube tip is in the stomach with the side port in the proximal stomach. Nonobstructive bowel gas pattern. Abdominal aortic stent graft noted. Visualized lungs clear. IMPRESSION: NG tube tip in the stomach. Electronically Signed   By: Charlett Nose M.D.   On: 06/20/2023 13:52   DG Abd 1 View Result Date: 06/20/2023 CLINICAL DATA:  Abdominal pain and vomiting. EXAM: ABDOMEN - 1 VIEW COMPARISON:  06/19/2023 FINDINGS: Cholecystectomy clips. Status post stent graft repair of the abdominal aorta. Bilateral renal artery stents. Decreased gaseous distension of the small bowel loops. Previous stool burden within the right colon has progressed to the level of the splenic flexure and proximal descending colon. Contrast material noted within the  bladder. IMPRESSION: 1. Decreased gaseous distension of the small bowel loops. 2. Antegrade progression of moderate retained stool in the right colon. Electronically Signed   By: Signa Kell M.D.   On: 06/20/2023 06:37   CT Angio Abd/Pel W and/or Wo Contrast Result Date: 06/19/2023 CLINICAL DATA:  Abdominal pain EXAM: CTA ABDOMEN AND PELVIS WITHOUT AND WITH CONTRAST TECHNIQUE: Multidetector CT imaging of the abdomen and pelvis was performed using the standard protocol during bolus administration of intravenous contrast. Multiplanar reconstructed images and MIPs were obtained and reviewed to evaluate the vascular anatomy. RADIATION DOSE REDUCTION: This exam was performed according to the departmental  dose-optimization program which includes automated exposure control, adjustment of the mA and/or kV according to patient size and/or use of iterative reconstruction technique. CONTRAST:  OMNIPAQUE IOHEXOL 350 MG/ML SOLN COMPARISON:  CT 06/09/2023 FINDINGS: VASCULAR Aorta: Status post endovascular repair of aortic aneurysm with endovascular stent extending from the distal descending thoracic aorta to the iliac vessels. Patent stent. Maximum diameter of excluded aneurysm sac measures 5.4 cm, previously 5.4 cm. 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 Distal patency with no aneurysm or dissection. 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 Distal vascular patency and no dissection or aneurysm Renals: Bilateral renal artery stents which appear patent IMA: Occluded as before. Inflow: Patent common iliac artery stents. External iliac arteries are widely patent. Stenosis at the origin of the internal iliac arteries. Proximal Outflow: Bilateral common femoral and visualized portions of the superficial and profunda femoral arteries are patent without evidence of aneurysm, dissection, vasculitis or significant stenosis. Veins: Suboptimally evaluated Review of the MIP images confirms the above findings. NON-VASCULAR Lower chest: Lung bases demonstrate emphysema. No acute airspace disease. Hepatobiliary: Cholecystectomy.  No biliary dilatation. Pancreas: No inflammation. Ductal dilatation up to 5 mm which is chronic. Spleen: Normal in size without focal abnormality. Adrenals/Urinary Tract: Adrenal glands are normal. Kidneys show no hydronephrosis. The bladder is unremarkable Stomach/Bowel: Moderate fluid distension of the stomach. Interval development of multiple fluid-filled mildly dilated loops of small bowel measuring up to 3.1 cm. No acute bowel wall thickening. Large volume frothy stool in the right colon. Lymphatic: No suspicious lymph nodes  Reproductive: Enlarged prostate gland Other: Negative for pelvic effusion or free air Musculoskeletal: No acute or suspicious osseous abnormality. IMPRESSION: VASCULAR 1. 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 2. 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 NON-VASCULAR 1. Interim development of multiple fluid-filled mildly dilated loops of small bowel, without well-defined transition point suggestive of mild ileus or partial/developing bowel obstruction. 2. Enlarged prostate Electronically Signed   By: Jasmine Pang M.D.   On: 06/19/2023 23:10    Anti-infectives: Anti-infectives (From admission, onward)    Start     Dose/Rate Route Frequency Ordered Stop   06/20/23 1000  piperacillin-tazobactam (ZOSYN) IVPB 3.375 g        3.375 g 12.5 mL/hr over 240 Minutes Intravenous Every 8 hours 06/20/23 8119          Assessment/Plan POD 1 s/p dx laparoscopy by Dr. 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 Karin Lieu on 06/20/23 - Intra-op noted to have some murky fluid in the pelvis. All of the small bowel was  viable without evidence of ischemia or other abnormality. He did appear to have some venous congestion in the mesentery. The right colon, transverse colon, left colon, and rectosigmoid all appeared viable. The liver was viable. Case was handed over to vascular, Dr. Karin Lieu, who performed Stenting of the superior mesenteric artery   - Cont NGT to LIWS   FEN - NPO, NGT to LIWS, IVF per primary. PPI VTE - SCDs, on hold for episode of melena overnight. Cont Plavix, ASA per vascular ID - Zosyn Foley - in place, strict I/O  - Per primary -  VDRF - per CCM Acute on chronic anemia - melena like stool noted overnight.  Fecal occult pending. Stable at 12.3 > 11.9 on last check. Monitor and trend H/H.  Hx CAD status post PCI Hx COPD Hx chronic HFrEF Hx AAA s/p repair Hx PAD Hx carotid artery stenosis s/p carotid endarterectomy     LOS: 1 day    Jacinto Halim, [facility] Surgery 06/21/2023, 8:10 AM Please see Amion for pager number during day hours 7:00am-4:30pm

## 2023-06-21 NOTE — TOC Progression Note (Addendum)
 Transition of Care (TOC) - Progression Note    Patient Details  Name: Erik [name]. MRN: [id number] Date of Birth: 1940-12-12  Transition of Care [facility]) CM/SW Contact  Marliss Coots, LCSW Phone Number: 06/21/2023, 11:52 AM  Clinical Narrative:     11:52 AM CSW introduced self and role to patient's wife, Erik [name], at patient's bedside (patient is currently intubated). CSW offered support and resources as needed. Erik [name] informed CSW of the multiple social supports they have in the area and confirmed patient resides with her. Erik [name] confirmed she could provide transportation upon discharge, as well. No TOC needs identified at this time.  Expected Discharge Plan: Home w Home Health Services Barriers to Discharge: Continued Medical Work up  Expected Discharge Plan and Services       Living arrangements for the past 2 months: Single Family Home                                       Social Determinants of Health (SDOH) Interventions SDOH Screenings   Food Insecurity: No Food Insecurity (06/20/2023)  Housing: Low Risk  (06/20/2023)  Transportation Needs: No Transportation Needs (06/20/2023)  Utilities: Not At Risk (06/20/2023)  Alcohol Screen: Low Risk  (05/31/2019)  Depression (PHQ2-9): Low Risk  (09/27/2022)  Financial Resource Strain: Low Risk  (04/29/2021)  Physical Activity: Inactive (05/31/2019)  Stress: No Stress Concern Present (05/31/2019)  Tobacco Use: High Risk (06/20/2023)    Readmission Risk Interventions    02/17/2022    4:21 PM  Readmission Risk Prevention Plan  Transportation Screening Complete  PCP or Specialist Appt within 3-5 Days Complete  HRI or Home Care Consult Complete  Social Work Consult for Recovery Care Planning/Counseling Complete  Palliative Care Screening Complete  Medication Review Oceanographer) Complete

## 2023-06-21 NOTE — Progress Notes (Signed)
 NAME:  Erik [name]., MRN:  [id number], DOB:  02-22-1941, LOS: 1 ADMISSION DATE:  06/19/2023, CONSULTATION DATE:  06/20/2023 REFERRING MD:   Dr. Toniann Fail  CHIEF COMPLAINT:  Abd pain    History of Present Illness:  83 year old male patient with a known history of severe coronary artery disease with prior coronary artery stenting as well as AAA repair complicated further by heart failure with reduced EF COPD and tobacco abuse presented to the emergency room at [facility] on 4/6 with 2-day history of progressive abdominal pain which had been ongoing and intermittent over 2 to 3 months but acutely worse.  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  Returns to the intensive care on mechanical ventilator critical care asked to evaluate.  Pertinent  Medical History  CAD, status post PCI Chronic HF  AAA s/p repair COPD  Severe PAD Tobacco use   Significant Hospital Events: Including procedures, antibiotic start and stop dates in addition to other pertinent events     Interim History / Subjective:  Sedated and mechanically ventilated .   Objective   Blood pressure (!) 142/68, pulse 91, temperature (!) 97.3 F (36.3 C), temperature source Axillary, resp. rate (!) 21, height 5\' 6"  (1.676 m), weight 50.8 kg, SpO2 99%.    Vent Mode: PRVC FiO2 (%):  [45 %-60 %] 45 % Set Rate:  [16 bmp-18 bmp] 18 bmp Vt Set:  [510 mL] 510 mL PEEP:  [5 cmH20] 5 cmH20 Plateau Pressure:  [12 cmH20-19 cmH20] 15 cmH20   Intake/Output Summary (Last 24 hours) at 06/21/2023 0809 Last data filed at 06/21/2023 0735 Gross per 24 hour  Intake 5760.18 ml  Output 1596 ml  Net 4164.18 ml   Filed Weights    06/19/23 1836  Weight: 50.8 kg    Examination: General: Laying in bed ,mechanically ventilated,minimally responsive  Lungs: Non labored  Cardiovascular: Regular rate and rhythm.  DP pulses palpable bilaterally Abdomen: Soft ,no guarding or rebound tenderness  Extremities: Warm and dry.  No lower extremity edema Neuro: Unable to assess GU: Foley in place with straw-colored urine  Hospital Problem list   Ischemic bowel lactic acidosis status post stent placement in the SMA History of CVA PVD Hyperlipidemia BPH Heart failure with mildly reduced ejection fraction Ventilator management with underlying COPD chronic tobacco use   Assessment & Plan:   - SBT today,Extubate when appropriate  - Lactic acid normalized, will discontinue antibiotic - Continue Plavix and rectal aspirin - Foley in place, strict I's and O's - Pulmicort - PPIs  Best Practice (right click and "Reselect all SmartList Selections" daily)   Diet/type: NPO DVT prophylaxis: SCD GI prophylaxis: N/A Lines: Central line and Arterial Line Foley:  Yes, and it is still needed Code Status:  full code Last date of multidisciplinary goals of care discussion [06/21/2023]  Labs   CBC: Recent Labs  Lab 06/19/23 2000 06/20/23 0726 06/20/23 1846 06/21/23 0427 06/21/23 0620  WBC 9.2 20.2* 10.8* 3.3*  --   NEUTROABS  --  18.5*  --   --   --   HGB 9.8* 10.9* 11.4* 12.3* 11.9*  HCT 31.0* 36.8* 36.7* 37.7* 37.1*  MCV 87.6 91.5 89.5 87.1  --   PLT  280 363 222 210  --     Basic Metabolic Panel: Recent Labs  Lab 06/19/23 2000 06/20/23 0726 06/20/23 1846 06/21/23 0427  NA 135 137  --  140  K 4.2 3.9  --  4.3  CL 103 101  --  113*  CO2 23 20*  --  18*  GLUCOSE 104* 168*  --  117*  BUN 22 28*  --  39*  CREATININE 0.98 1.27* 1.18 1.30*  CALCIUM 8.8* 9.0  --  7.8*  MG  --   --   --  1.8  PHOS  --   --   --  4.2   GFR: Estimated Creatinine Clearance: 31.5 mL/min (A) (by C-G formula based on SCr of 1.3 mg/dL  (H)). Recent Labs  Lab 06/19/23 2000 06/20/23 0726 06/20/23 1025 06/20/23 1306 06/20/23 1755 06/20/23 1846 06/20/23 2022 06/21/23 0427  WBC 9.2 20.2*  --   --   --  10.8*  --  3.3*  LATICACIDVEN  --  6.2*   < > 7.4* 5.2*  --  2.3* 1.4   < > = values in this interval not displayed.    Liver Function Tests: Recent Labs  Lab 06/20/23 0558  AST 35  ALT 14  ALKPHOS 78  BILITOT 0.6  PROT 8.4*  ALBUMIN 3.5   No results for input(s): "LIPASE", "AMYLASE" in the last 168 hours. No results for input(s): "AMMONIA" in the last 168 hours.  ABG    Component Value Date/Time   PHART 7.29 (L) 06/21/2023 0427   PCO2ART 43 06/21/2023 0427   PO2ART 119 (H) 06/21/2023 0427   HCO3 20.7 06/21/2023 0427   TCO2 24 09/10/2013 0220   ACIDBASEDEF 6.0 (H) 06/21/2023 0427   O2SAT 99.4 06/21/2023 0427     Coagulation Profile: No results for input(s): "INR", "PROTIME" in the last 168 hours.  Cardiac Enzymes: No results for input(s): "CKTOTAL", "CKMB", "CKMBINDEX", "TROPONINI" in the last 168 hours.  HbA1C: Hgb A1c MFr Bld  Date/Time Value Ref Range Status  08/19/2022 08:19 AM 4.5 (L) 4.6 - 6.5 % Final    Comment:    Glycemic Control Guidelines for People with Diabetes:Non Diabetic:  <6%Goal of Therapy: <7%Additional Action Suggested:  >8%   07/07/2021 09:51 AM 6.0 4.6 - 6.5 % Final    Comment:    Glycemic Control Guidelines for People with Diabetes:Non Diabetic:  <6%Goal of Therapy: <7%Additional Action Suggested:  >8%     CBG: Recent Labs  Lab 06/20/23 1734 06/20/23 2014 06/21/23 0015 06/21/23 0420  GLUCAP 124* 143* 130* 120*    Review of Systems:   Unable to assess  Past Medical History:  He,  has a past medical history of Adenomatous colon polyp, Aortic aneurysm (HCC), BPH (benign prostatic hypertrophy), CAD (coronary artery disease), Carotid artery stenosis, Cataract, CHF (congestive heart failure) (HCC), Chronic systolic heart failure (HCC), Collagen vascular disease  (HCC), COPD (chronic obstructive pulmonary disease) (HCC), Coronary artery disease, Diverticulosis, GERD (gastroesophageal reflux disease), Heart murmur, History of colonic polyps, Hyperlipidemia, Hyperplastic colon polyp, Hypertension, Ischemic cardiomyopathy, and RLS (restless legs syndrome).   Surgical History:   Past Surgical History:  Procedure Laterality Date   BUBBLE STUDY  02/03/2022   Procedure: BUBBLE STUDY;  Surgeon: Meriam Sprague, MD;  Location: [facility] ENDOSCOPY;  Service: Cardiovascular;;   cardiac stents  09-2013   CAROTID ENDARTERECTOMY  04/17/2008   right   CAROTID ENDARTERECTOMY  05/30/08   Left   CATARACT EXTRACTION W/PHACO Right 10/05/2018  Procedure: CATARACT EXTRACTION PHACO AND INTRAOCULAR LENS PLACEMENT (IOC), RIGHT;  Surgeon: Galen Manila, MD;  Location: [facility] ORS;  Service: Ophthalmology;  Laterality: Right;  [place] 01:06.4 cde 12.79 Fluid Pack Lot # [id number]   CATARACT EXTRACTION W/PHACO Left 11/02/2018   Procedure: CATARACT EXTRACTION PHACO AND INTRAOCULAR LENS PLACEMENT (IOC), LEFT;  Surgeon: Galen Manila, MD;  Location: [facility] ORS;  Service: Ophthalmology;  Laterality: Left;  [place]  01:10 CDE 11.52 Fluid pack lot # [id number]   CHOLECYSTECTOMY     Gall Bladder   CORONARY ANGIOGRAM  09/11/2013   Procedure: CORONARY ANGIOGRAM;  Surgeon: Lennette Bihari, MD;  Location: [facility] CATH LAB;  Service: Cardiovascular;;   CORONARY ANGIOPLASTY     STENTS   HERNIA REPAIR     lower aorta aneurysm  05/26/2015   [facility]   PERCUTANEOUS STENT INTERVENTION  09/11/2013   Procedure: PERCUTANEOUS STENT INTERVENTION;  Surgeon: Lennette Bihari, MD;  Location: [facility] CATH LAB;  Service: Cardiovascular;;  DES Prox RCA 3.5x15 xience    TEE WITHOUT CARDIOVERSION N/A 02/03/2022   Procedure: TRANSESOPHAGEAL ECHOCARDIOGRAM (TEE);  Surgeon: Meriam Sprague, MD;  Location: [facility] ENDOSCOPY;  Service: Cardiovascular;  Laterality: N/A;     Social History:   reports that he has been smoking  cigarettes. He has a 50 pack-year smoking history. He has never been exposed to tobacco smoke. He uses smokeless tobacco. He reports that he does not drink alcohol and does not use drugs.   Family History:  His family history includes Cancer in his father; Emphysema in his father; Heart attack in his brother; Heart disease in his brother; Hyperlipidemia in his brother, brother, brother, and brother; Hypertension in his mother; Multiple sclerosis in his brother; Pancreatic cancer in his brother. There is no history of Kidney disease, Prostate cancer, Colon cancer, Esophageal cancer, Rectal cancer, or Stomach cancer.   Allergies Allergies  Allergen Reactions   Codeine Anaphylaxis and Swelling    throat swells     Home Medications  Prior to Admission medications   Medication Sig Start Date End Date Taking? Authorizing Provider  acetaminophen (TYLENOL) 500 MG tablet Take 1,000 mg by mouth every 6 (six) hours as needed for moderate pain (pain score 4-6).   Yes [provider]  aspirin EC 81 MG tablet Take 81 mg by mouth every evening.    Yes [provider]  clopidogrel (PLAVIX) 75 MG tablet TAKE 1 TABLET BY MOUTH EVERY DAY 11/03/22  Yes Laurey Morale, MD  cyanocobalamin (VITAMIN B12) 1000 MCG tablet Take 1,000 mcg by mouth daily.   Yes [provider]  ezetimibe-simvastatin (VYTORIN) 10-40 MG tablet TAKE 1 TABLET BY MOUTH EVERYDAY AT BEDTIME 10/06/22  Yes Bedsole, Amy E, MD  famotidine (PEPCID) 40 MG tablet Take 40 mg by mouth 2 (two) times daily.   Yes [provider]  FARXIGA 10 MG TABS tablet TAKE 1 TABLET BY MOUTH DAILY BEFORE BREAKFAST. 11/29/22  Yes Laurey Morale, MD  ferrous sulfate 325 (65 FE) MG tablet Take 325 mg by mouth daily with breakfast.   Yes [provider]  gabapentin (NEURONTIN) 100 MG capsule Take 1 capsule (100 mg total) by mouth 3 (three) times daily. Patient taking differently: Take 100-200 mg by mouth See admin instructions.  Take 100mg  (1 capsule) by mouth every morning and 200mg  (2 capsules) at night. 03/29/23  Yes Bedsole, Amy E, MD  methocarbamol (ROBAXIN) 500 MG tablet TAKE 1 TABLET (500 MG TOTAL) BY MOUTH EVERY DAY AT BEDTIME AS  NEEDED FOR MUSCLE SPASM 06/09/23  Yes Bedsole, Amy E, MD  metoprolol succinate (TOPROL-XL) 50 MG 24 hr tablet Take 1 tablet (50 mg total) by mouth daily. 03/29/23  Yes Laurey Morale, MD  mirtazapine (REMERON) 15 MG tablet TAKE 1 TABLET BY MOUTH EVERYDAY AT BEDTIME 03/07/23  Yes Bedsole, Amy E, MD  Omega-3 Fatty Acids (FISH OIL) 1000 MG CAPS Take 1 capsule by mouth daily.   Yes [provider]  rOPINIRole (REQUIP) 3 MG tablet TAKE 1/2 TABLET BY MOUTH IN THE MORNING, 1/2 TABLET MID DAY & TAKE 1 TABLET AT NIGHT 02/08/23  Yes Bedsole, Amy E, MD  spironolactone (ALDACTONE) 25 MG tablet TAKE 1/2 TABLET BY MOUTH DAILY 03/29/23  Yes Laurey Morale, MD  tamsulosin (FLOMAX) 0.4 MG CAPS capsule TAKE 1 CAPSULE BY MOUTH EVERY DAY 09/08/22  Yes Excell Seltzer, MD     Critical care time: 77 Minutes      Kathleen Lime MD [facility] Internal Medicine Program - PGY-1 06/21/2023, 8:09 AM Pager# [phone]

## 2023-06-21 NOTE — Progress Notes (Signed)
 PHARMACIST LIPID MONITORING   Erik Ocallaghan. is a 83 y.o. male admitted on 06/19/2023 with acute mesenteric ischemia s/p ex lap and stenting of SMA 4/7. Pharmacy has been consulted to optimize lipid-lowering therapy with the indication of secondary prevention for clinical ASCVD.  Recent Labs:  Lipid Panel (last 6 months):   Lab Results  Component Value Date   CHOL 55 06/21/2023   TRIG 54 06/21/2023   TRIG 55 06/21/2023   HDL 36 (L) 06/21/2023   CHOLHDL 1.5 06/21/2023   VLDL 11 06/21/2023   LDLCALC 8 06/21/2023    Hepatic function panel (last 6 months):   Lab Results  Component Value Date   AST 35 06/20/2023   ALT 14 06/20/2023   ALKPHOS 78 06/20/2023   BILITOT 0.6 06/20/2023   BILIDIR 0.1 06/20/2023   IBILI 0.5 06/20/2023    SCr (since admission):   Serum creatinine: 1.3 mg/dL (H) 16/10/96 0454 Estimated creatinine clearance: 31.5 mL/min (A)  Current therapy and lipid therapy tolerance Current lipid-lowering therapy: Ezetimibe-Simvastatin 10-40 mg tablet at bedtime, Fish oil 1000 mg daily  Previous lipid-lowering therapies (if applicable): n/a Documented or reported allergies or intolerances to lipid-lowering therapies (if applicable):  n/a   Assessment:   Patient prefers no changes in lipid-lowering therapy at this time due to LDL below goal  Plan:    1.Statin intensity:  No changes to statin therapy as LDL is 8  2. On zetia / fish oil   3.Refer to lipid clinic:   No  4.Follow-up with:  Primary care provider - Excell Seltzer, MD  5.Follow-up labs after discharge:  No changes in lipid therapy, repeat a lipid panel in one year.     Cedric Fishman, PharmD 06/21/2023, 9:03 AM

## 2023-06-21 NOTE — Progress Notes (Signed)
 Initial Nutrition Assessment  DOCUMENTATION CODES:  Underweight, Severe malnutrition in context of chronic illness  INTERVENTION:  Recommend initiation of enteral feeds in the next 24 hours or PO diet if pt extubated Pt would benefit from nutrition supplements once diet advanced. Boost Plus is pt's preferred supplement. If pt not able to have enteral nutrition initiated by 4/9, recommend initiation of TPN as pt is severely malnourished and underweight.  Pt will be at risk of refeeding. Will need monitoring of electrolytes and supplementation with thiamine with initiation of nutrition.  NUTRITION DIAGNOSIS:  Severe Malnutrition related to chronic illness (CHF) as evidenced by severe muscle depletion, severe fat depletion.  GOAL:  Patient will meet greater than or equal to 90% of their needs  MONITOR:  I & O's, Vent status, Labs, Weight trends  REASON FOR ASSESSMENT:  Ventilator    ASSESSMENT:  Pt with hx of CAD, HLD, HTN, CHF, and GERD presented to ED with abdominal pain that has been ongoing for months but significantly worse on the day of admission.   4/6 - presented to ED 4/7 - OR, s/p dx laparoscopy, stent placed in SMA by vascular surgery, left intubated after surgery  Patient is currently intubated on ventilator support. Wife at bedside able to provide a nutrition hx.   Discussed recent weight hx and intake. Wife reports that generally, pt has a good appetite. Eats 3 meals each day. States that they will go out and get a biscuit for breakfast or she will cook something such as eggs and toast at home. They generally eat breakfast later but pt will always have a boost plus and possibly a sandwich for lunch and then they cook dinner at home (a meat with sides). Wife reports that over the last few years, pt has been losing weight gradually despite no change in his intake or appetite. It was at that time that pt started consuming the boost daily to try and maintain weight (of note, pt  only like chocolate boost, not ensure). Typically weighed around 132 lbs but at recent MD visit he weighed 112 lb. On exam, pt with significant muscle and fat depletions suggestive of severe malnutrition.   Discussed energy levels. Reports that pt is very active. 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 However, she does report that over the last few months, she has noticed it seems like he is having to rest more and that he has been complaining of his abdominal/flank pain for months. Has been to the MD many times for workup over why he is losing weight and what was the cause of his abdominal pain with no prior success.   Discussed with attending - hopeful to be able to extubate in the next 24 hours and is optimistic that pt will be able to eat. CCM is deferring diet/feeding to surgery team. Inquired about starting trickle feeds today - surgery requests to hold off for now. Noted that vascular surgery expressed concern on their exam this AM over increased distention and requested OGT remain to suction. If pt is not able to initiate enteral or PO feeds tomorrow, would recommend initiation of TPN in the context of his severe malnutrition and underweight BMI  MV: 9 L/min Temp (24hrs), Avg:98.9 F (37.2 C), Min:97.3 F (36.3 C), Max:100.7 F (38.2 C) MAP (Art Line):  Admit / Current weight: 50.8 kg Wife reports UBW of ~132 lbs which he has slowly been losing  over the   Intake/Output Summary (Last 24 hours) at 06/21/2023 1558 Last data filed at 06/21/2023 1500 Gross per 24 hour  Intake 4528.97 ml  Output 1296 ml  Net 3232.97 ml  Net IO Since Admission: 4,432.97 mL [06/21/23 1558]  Drains/Lines: Art Line Double Lumen IJ OGT UOP  Nutritionally Relevant Medications: Scheduled Meds:  pantoprazole IV  40 mg Intravenous Q12H   Continuous Infusions:  sodium chloride 75 mL/hr at 06/21/23 1300   piperacillin-tazobactam (ZOSYN)  IV  3.375 g (06/21/23 1339)   PRN Meds: ondansetron  Labs Reviewed: Chloride 113 BUN 39, creatinine 1.3 CBG ranges from 98-143 mg/dL over the last 24 hours HgbA1c 4.5% (08/19/22)  NUTRITION - FOCUSED PHYSICAL EXAM: Flowsheet Row Most Recent Value  Orbital Region Severe depletion  Upper Arm Region Severe depletion  Thoracic and Lumbar Region Severe depletion  Buccal Region Moderate depletion  Temple Region Severe depletion  Clavicle Bone Region Moderate depletion  Clavicle and Acromion Bone Region Severe depletion  Scapular Bone Region Moderate depletion  Dorsal Hand Severe depletion  Patellar Region Severe depletion  Anterior Thigh Region Severe depletion  Posterior Calf Region Severe depletion  Edema (RD Assessment) None  Hair Reviewed  Eyes Reviewed  Mouth Reviewed  Skin Reviewed  Nails Reviewed    Diet Order:   Diet Order             Diet NPO time specified  Diet effective now                   EDUCATION NEEDS:  Not appropriate for education at this time  Skin:  Skin Assessment: Reviewed RN Assessment  Last BM:  4/8 - type 6  Height:  Ht Readings from Last 1 Encounters:  06/19/23 5\' 6"  (1.676 m)    Weight:  Wt Readings from Last 1 Encounters:  06/19/23 50.8 kg    Ideal Body Weight:  64.5 kg  BMI:  Body mass index is 18.08 kg/m.  Estimated Nutritional Needs:  Kcal:  1500-1800 kcal/d Protein:  80-95g/d Fluid:  1.8L/d    Greig Castilla, RD, LDN Registered Dietitian II Please reach out via secure chat

## 2023-06-21 NOTE — Procedures (Signed)
 Extubation Procedure Note  Patient Details:   Name: Erik [name]. DOB: 12/30/1940 MRN: [id number]   Airway Documentation:  Airway 7.5 mm (Active)  Secured at (cm) 23 cm 06/21/23 1552  Measured From Lips 06/21/23 1552  Secured Location Right 06/21/23 1552  Secured By [employer] 06/21/23 1552  Bite Block No 06/21/23 1552  Tube Holder Repositioned Yes 06/21/23 1552  Prone position No 06/21/23 1552  Cuff Pressure (cm H2O) Green OR 18-26 [facility] 06/21/23 0817  Site Condition Dry 06/21/23 1552   Vent end date: (not recorded) Vent end time: (not recorded)   Evaluation  O2 sats: stable throughout Complications: No apparent complications Patient did tolerate procedure well. Bilateral Breath Sounds: Clear, Diminished   Yes  Halyn Flaugher 06/21/2023, 4:05 PM Pt extubated to 4LPM [place] at this time.  Pt able to speak his name after extubation.  Rt will continue to monitor.

## 2023-06-21 NOTE — Progress Notes (Signed)
   06/21/23 0945  Daily Weaning Assessment  Daily Assessment of Readiness to Wean Wean protocol criteria met (SBT performed)  Patient response Failed SBT terminated  Reason SBT Terminated  (LOW RR and MVE)

## 2023-06-22 ENCOUNTER — Inpatient Hospital Stay (HOSPITAL_COMMUNITY)

## 2023-06-22 ENCOUNTER — Ambulatory Visit: Admitting: Vascular Surgery

## 2023-06-22 DIAGNOSIS — J449 Chronic obstructive pulmonary disease, unspecified: Secondary | ICD-10-CM | POA: Diagnosis not present

## 2023-06-22 DIAGNOSIS — I5020 Unspecified systolic (congestive) heart failure: Secondary | ICD-10-CM | POA: Diagnosis not present

## 2023-06-22 DIAGNOSIS — Z720 Tobacco use: Secondary | ICD-10-CM | POA: Diagnosis not present

## 2023-06-22 LAB — BASIC METABOLIC PANEL WITH GFR
Anion gap: 12 (ref 5–15)
Anion gap: 11 (ref 5–15)
BUN: 62 mg/dL — ABNORMAL HIGH (ref 8–23)
BUN: 68 mg/dL — ABNORMAL HIGH (ref 8–23)
CO2: 18 mmol/L — ABNORMAL LOW (ref 22–32)
CO2: 19 mmol/L — ABNORMAL LOW (ref 22–32)
Calcium: 7.3 mg/dL — ABNORMAL LOW (ref 8.9–10.3)
Calcium: 7.6 mg/dL — ABNORMAL LOW (ref 8.9–10.3)
Chloride: 114 mmol/L — ABNORMAL HIGH (ref 98–111)
Chloride: 114 mmol/L — ABNORMAL HIGH (ref 98–111)
Creatinine, Ser: 1.82 mg/dL — ABNORMAL HIGH (ref 0.61–1.24)
Creatinine, Ser: 1.75 mg/dL — ABNORMAL HIGH (ref 0.61–1.24)
GFR, Estimated: 37 mL/min — ABNORMAL LOW (ref >60)
GFR, Estimated: 38 mL/min — ABNORMAL LOW (ref >60)
Glucose, Bld: 77 mg/dL (ref 70–99)
Glucose, Bld: 119 mg/dL — ABNORMAL HIGH (ref 70–99)
Potassium: 3.7 mmol/L (ref 3.5–5.1)
Potassium: 3.5 mmol/L (ref 3.5–5.1)
Sodium: 144 mmol/L (ref 135–145)
Sodium: 144 mmol/L (ref 135–145)

## 2023-06-22 LAB — POCT I-STAT 7, (LYTES, BLD GAS, ICA,H+H)
Acid-base deficit: 13 mmol/L — ABNORMAL HIGH (ref 0.0–2.0)
Acid-base deficit: 14 mmol/L — ABNORMAL HIGH (ref 0.0–2.0)
Acid-base deficit: 12 mmol/L — ABNORMAL HIGH (ref 0.0–2.0)
Acid-base deficit: 5 mmol/L — ABNORMAL HIGH (ref 0.0–2.0)
Acid-base deficit: 7 mmol/L — ABNORMAL HIGH (ref 0.0–2.0)
Bicarbonate: 15.8 mmol/L — ABNORMAL LOW (ref 20.0–28.0)
Bicarbonate: 14.8 mmol/L — ABNORMAL LOW (ref 20.0–28.0)
Bicarbonate: 15.7 mmol/L — ABNORMAL LOW (ref 20.0–28.0)
Bicarbonate: 19.8 mmol/L — ABNORMAL LOW (ref 20.0–28.0)
Bicarbonate: 18.1 mmol/L — ABNORMAL LOW (ref 20.0–28.0)
Calcium, Ion: 1.09 mmol/L — ABNORMAL LOW (ref 1.15–1.40)
Calcium, Ion: 1.01 mmol/L — ABNORMAL LOW (ref 1.15–1.40)
Calcium, Ion: 1.19 mmol/L (ref 1.15–1.40)
Calcium, Ion: 1.13 mmol/L — ABNORMAL LOW (ref 1.15–1.40)
Calcium, Ion: 1.09 mmol/L — ABNORMAL LOW (ref 1.15–1.40)
HCT: 31 % — ABNORMAL LOW (ref 39.0–52.0)
HCT: 26 % — ABNORMAL LOW (ref 39.0–52.0)
HCT: 33 % — ABNORMAL LOW (ref 39.0–52.0)
HCT: 33 % — ABNORMAL LOW (ref 39.0–52.0)
HCT: 32 % — ABNORMAL LOW (ref 39.0–52.0)
Hemoglobin: 10.5 g/dL — ABNORMAL LOW (ref 13.0–17.0)
Hemoglobin: 8.8 g/dL — ABNORMAL LOW (ref 13.0–17.0)
Hemoglobin: 11.2 g/dL — ABNORMAL LOW (ref 13.0–17.0)
Hemoglobin: 11.2 g/dL — ABNORMAL LOW (ref 13.0–17.0)
Hemoglobin: 10.9 g/dL — ABNORMAL LOW (ref 13.0–17.0)
O2 Saturation: 100 %
O2 Saturation: 99 %
O2 Saturation: 98 %
O2 Saturation: 94 %
O2 Saturation: 91 %
Patient temperature: 37.7
Patient temperature: 37.6
Patient temperature: 97.9
Potassium: 4 mmol/L (ref 3.5–5.1)
Potassium: 4.1 mmol/L (ref 3.5–5.1)
Potassium: 4.3 mmol/L (ref 3.5–5.1)
Potassium: 3.5 mmol/L (ref 3.5–5.1)
Potassium: 3.4 mmol/L — ABNORMAL LOW (ref 3.5–5.1)
Sodium: 139 mmol/L (ref 135–145)
Sodium: 141 mmol/L (ref 135–145)
Sodium: 142 mmol/L (ref 135–145)
Sodium: 148 mmol/L — ABNORMAL HIGH (ref 135–145)
Sodium: 150 mmol/L — ABNORMAL HIGH (ref 135–145)
TCO2: 17 mmol/L — ABNORMAL LOW (ref 22–32)
TCO2: 16 mmol/L — ABNORMAL LOW (ref 22–32)
TCO2: 17 mmol/L — ABNORMAL LOW (ref 22–32)
TCO2: 21 mmol/L — ABNORMAL LOW (ref 22–32)
TCO2: 19 mmol/L — ABNORMAL LOW (ref 22–32)
pCO2 arterial: 47.9 mmHg (ref 32–48)
pCO2 arterial: 47.6 mmHg (ref 32–48)
pCO2 arterial: 42.5 mmHg (ref 32–48)
pCO2 arterial: 37.1 mmHg (ref 32–48)
pCO2 arterial: 32.1 mmHg (ref 32–48)
pH, Arterial: 7.131 — CL (ref 7.35–7.45)
pH, Arterial: 7.1 — CL (ref 7.35–7.45)
pH, Arterial: 7.176 — CL (ref 7.35–7.45)
pH, Arterial: 7.339 — ABNORMAL LOW (ref 7.35–7.45)
pH, Arterial: 7.357 (ref 7.35–7.45)
pO2, Arterial: 316 mmHg — ABNORMAL HIGH (ref 83–108)
pO2, Arterial: 188 mmHg — ABNORMAL HIGH (ref 83–108)
pO2, Arterial: 131 mmHg — ABNORMAL HIGH (ref 83–108)
pO2, Arterial: 75 mmHg — ABNORMAL LOW (ref 83–108)
pO2, Arterial: 61 mmHg — ABNORMAL LOW (ref 83–108)

## 2023-06-22 LAB — POCT ACTIVATED CLOTTING TIME
Activated Clotting Time: 181 s
Activated Clotting Time: 170 s

## 2023-06-22 LAB — GLUCOSE, CAPILLARY
Glucose-Capillary: 122 mg/dL — ABNORMAL HIGH (ref 70–99)
Glucose-Capillary: 72 mg/dL (ref 70–99)
Glucose-Capillary: 72 mg/dL (ref 70–99)
Glucose-Capillary: 78 mg/dL (ref 70–99)
Glucose-Capillary: 79 mg/dL (ref 70–99)
Glucose-Capillary: 95 mg/dL (ref 70–99)
Glucose-Capillary: 97 mg/dL (ref 70–99)

## 2023-06-22 LAB — CBC
HCT: 33.3 % — ABNORMAL LOW (ref 39.0–52.0)
Hemoglobin: 10.9 g/dL — ABNORMAL LOW (ref 13.0–17.0)
MCH: 28.4 pg (ref 26.0–34.0)
MCHC: 32.7 g/dL (ref 30.0–36.0)
MCV: 86.7 fL (ref 80.0–100.0)
Platelets: 163 10*3/uL (ref 150–400)
RBC: 3.84 MIL/uL — ABNORMAL LOW (ref 4.22–5.81)
RDW: 16.5 % — ABNORMAL HIGH (ref 11.5–15.5)
WBC: 6 10*3/uL (ref 4.0–10.5)
nRBC: 0 % (ref 0.0–0.2)

## 2023-06-22 LAB — MAGNESIUM: Magnesium: 2.1 mg/dL (ref 1.7–2.4)

## 2023-06-22 LAB — RENAL FUNCTION PANEL
Albumin: 1.9 g/dL — ABNORMAL LOW (ref 3.5–5.0)
Anion gap: 12 (ref 5–15)
BUN: 67 mg/dL — ABNORMAL HIGH (ref 8–23)
CO2: 19 mmol/L — ABNORMAL LOW (ref 22–32)
Calcium: 7.7 mg/dL — ABNORMAL LOW (ref 8.9–10.3)
Chloride: 115 mmol/L — ABNORMAL HIGH (ref 98–111)
Creatinine, Ser: 1.74 mg/dL — ABNORMAL HIGH (ref 0.61–1.24)
GFR, Estimated: 39 mL/min — ABNORMAL LOW (ref >60)
Glucose, Bld: 119 mg/dL — ABNORMAL HIGH (ref 70–99)
Phosphorus: 3.6 mg/dL (ref 2.5–4.6)
Potassium: 3.5 mmol/L (ref 3.5–5.1)
Sodium: 146 mmol/L — ABNORMAL HIGH (ref 135–145)

## 2023-06-22 MED ORDER — FENTANYL CITRATE PF 50 MCG/ML IJ SOSY
PREFILLED_SYRINGE | INTRAMUSCULAR | Status: AC
  Filled 2023-06-22: qty 2

## 2023-06-22 MED ORDER — SODIUM CHLORIDE 0.9 % IV SOLN
250.0000 mL | Freq: Once | INTRAVENOUS | Status: AC
  Administered 2023-06-22: 250 mL via INTRAVENOUS

## 2023-06-22 MED ORDER — LACTATED RINGERS IV SOLN: INTRAVENOUS | Status: DC

## 2023-06-22 MED ORDER — POTASSIUM CHLORIDE 10 MEQ/50ML IV SOLN
10.0000 meq | INTRAVENOUS | Status: AC
  Administered 2023-06-22 – 2023-06-23 (×4): 10 meq via INTRAVENOUS
  Filled 2023-06-22 (×4): qty 50

## 2023-06-22 MED ORDER — POTASSIUM CHLORIDE 20 MEQ PO PACK: 40.0000 meq | PACK | Freq: Once | ORAL | Status: DC

## 2023-06-22 MED ORDER — LABETALOL HCL 5 MG/ML IV SOLN
10.0000 mg | INTRAVENOUS | Status: DC | PRN
  Administered 2023-06-22 – 2023-06-25 (×3): 10 mg via INTRAVENOUS
  Filled 2023-06-22 (×2): qty 4

## 2023-06-22 MED ORDER — HYDROMORPHONE HCL 1 MG/ML IJ SOLN
0.2500 mg | INTRAMUSCULAR | Status: DC | PRN
  Administered 2023-06-22 – 2023-06-26 (×10): 0.25 mg via INTRAVENOUS
  Filled 2023-06-22 (×2): qty 0.5
  Filled 2023-06-22: qty 1
  Filled 2023-06-22 (×7): qty 0.5

## 2023-06-22 MED ORDER — MIDAZOLAM HCL 2 MG/2ML IJ SOLN
INTRAMUSCULAR | Status: AC
  Filled 2023-06-22: qty 2

## 2023-06-22 MED ORDER — ROCURONIUM BROMIDE 10 MG/ML (PF) SYRINGE
PREFILLED_SYRINGE | INTRAVENOUS | Status: AC
  Filled 2023-06-22: qty 10

## 2023-06-22 MED ORDER — FUROSEMIDE 10 MG/ML IJ SOLN
60.0000 mg | Freq: Once | INTRAMUSCULAR | Status: AC
  Administered 2023-06-22: 60 mg via INTRAVENOUS
  Filled 2023-06-22: qty 6

## 2023-06-22 MED ORDER — DEXTROSE IN LACTATED RINGERS 5 % IV SOLN: INTRAVENOUS | Status: DC

## 2023-06-22 MED ORDER — LACTATED RINGERS IV BOLUS: 500.0000 mL | Freq: Once | INTRAVENOUS | Status: DC

## 2023-06-22 MED ORDER — LORAZEPAM 2 MG/ML IJ SOLN
1.0000 mg | Freq: Once | INTRAMUSCULAR | Status: AC
  Administered 2023-06-22: 1 mg via INTRAVENOUS
  Filled 2023-06-22: qty 1

## 2023-06-22 MED ORDER — PANTOPRAZOLE SODIUM 40 MG IV SOLR
40.0000 mg | INTRAVENOUS | Status: AC
  Administered 2023-06-23 – 2023-06-28 (×7): 40 mg via INTRAVENOUS
  Filled 2023-06-22 (×7): qty 10

## 2023-06-22 MED ORDER — ETOMIDATE 2 MG/ML IV SOLN
INTRAVENOUS | Status: AC
  Filled 2023-06-22: qty 20

## 2023-06-22 MED ORDER — CALCIUM GLUCONATE-NACL 2-0.675 GM/100ML-% IV SOLN
2.0000 g | Freq: Once | INTRAVENOUS | Status: AC
  Administered 2023-06-23: 2000 mg via INTRAVENOUS
  Filled 2023-06-22: qty 100

## 2023-06-22 MED ORDER — DEXMEDETOMIDINE HCL IN NACL 400 MCG/100ML IV SOLN
0.0000 ug/kg/h | INTRAVENOUS | Status: DC
  Administered 2023-06-22: 0.2 ug/kg/h via INTRAVENOUS
  Filled 2023-06-22: qty 100

## 2023-06-22 MED ORDER — HYDROMORPHONE HCL 1 MG/ML IJ SOLN
0.2500 mg | INTRAMUSCULAR | Status: DC | PRN
  Administered 2023-06-22: 0.25 mg via INTRAVENOUS
  Filled 2023-06-22: qty 0.5

## 2023-06-22 NOTE — Progress Notes (Signed)
 eLink Physician-Brief Progress Note Patient Name: Erik [name]. DOB: 07/05/1940 MRN: [id number]   Date of Service  06/22/2023  HPI/Events of Note  Patient desaturating on HHFNC, NIBP and arterial line BP not correlating but MAP is correlating at 98.  eICU Interventions  Patient placed on BIPAP, saturation up to 93 %, Precedex gtt 0-0.2 mcg PRN agitation, Labetalol 10 mg  iv PRN MAP 100 ordered.        Thomasene Lot Xachary Hambly 06/22/2023, 10:01 PM

## 2023-06-22 NOTE — Progress Notes (Addendum)
 [facility]  Subjective  - confused this morning, on Bipap   Objective (!) 122/49 (!) 113 98 F (36.7 C) (Oral) 15 94%  Intake/Output Summary (Last 24 hours) at 06/22/2023 0750 Last data filed at 06/22/2023 0700 Gross per 24 hour  Intake 1435.9 ml  Output 1020 ml  Net 415.9 ml    Abdomen remains tender Nondistended Confused, disoriented O2 saturation 100%  Assessment/Planning: SMA stenting for acute mesenteric ischemia.  Laparoscopy at the time of surgery demonstrated viable bowel.   Appreciate general surgery continuing to follow.  Current issues appear mostly respiratory.  I would like for his abdominal pain to improve. I expect there is some amount of post-perfusion mesenteric edema. Will continue to see. No further interventions planned.    Laboratory Lab Results: Recent Labs    06/21/23 0427 06/21/23 0620 06/21/23 1207 06/22/23 0424  WBC 3.3*  --   --  6.0  HGB 12.3*   < > 11.7* 10.9*  HCT 37.7*   < > 36.8* 33.3*  PLT 210  --   --  163   < > = values in this interval not displayed.   BMET Recent Labs    06/21/23 0427 06/22/23 0424  NA 140 144  K 4.3 3.7  CL 113* 114*  CO2 18* 18*  GLUCOSE 117* 77  BUN 39* 62*  CREATININE 1.30* 1.82*  CALCIUM 7.8* 7.3*    COAG Lab Results  Component Value Date   INR 1.1 02/21/2023   INR 1.3 (H) 02/15/2022   INR 1.3 (H) 02/03/2022   No results found for: "PTT"

## 2023-06-22 NOTE — Progress Notes (Signed)
 RT has attempted NT suctioning with no success. RT was able to pass catheter, but was unable to suction any secretions.  No oral secretions able to be suctioned with yankuer either.  RT willl continue to monitor

## 2023-06-22 NOTE — Progress Notes (Addendum)
 1900 Received care of pt on 15L NRB and 5L [place] with O2 sat 93%, rhonchus lung sounds, weak cough. Pt not able to clear secretions by cough. Pt lethargic/drowsy, restless, A+O x 2. Per shift report, pt acutely decompensated to 80% on [place] at 1800. Lasix was given, ABG done, escalated to 15L NRB + [place]. CCM MD messaged day shift RN that pt needs HHFNC. No orders placed.   1930 RT placed pt on 15L [place]. This RN notified Pola Corn RN. Order for CXR placed.   2000 Pt O2 sat 87-89% on 15L [place]. RT placed pt on HHFNC.  Pola Corn MD ordered ABG, order complete.  2130 Pt maxed on HHFNC, desatting to mid 80s. RT placed pt on 15L NRB in addition to HHFNC. RT performed NT suction. Despite this, pt O2 sat remaining 81-83%. Pt still sounds severely rhonchus on exam, unable to suction secretions, pt unable to cough them up. This RN requesting ground team evaluation. [facility] MD placed order for bipap. Rt placed pt on bipap.   2311 On assessment, pt with new BRB out of NGT, 50 mL. Pt drowsier and difficult to rouse with voice, responds with grimace to sternal rub. Previously was altered but still able to answer questions. Now unable to answer orientation questions. ELINK notified.  0400 When cleaning up pt, pt agitated, pulling off bipap, pulling at foley, and attempting to swing on staff members. Hit elink button, ELINK RN to notify MD.   [phone] Pt still agitated and attempting to pull lines and bipap off, kicking and swinging at staff. Called elink to follow up. MD camera into room and stated pt sleeping prior, ordering low dose dex.

## 2023-06-22 NOTE — Progress Notes (Signed)
 2 Days Post-Op  Subjective: CC: Seen w/ PCCM Extubated A&O x 4.  Gen abdominal pain. Wants water. No nausea. NGT w/ 300cc bilious output in cannister. No flatus but dark tarry bm overnight.   T max 100.7. HR 110's. Art line pressure 160/50's.  WBC wnl Hgb 10.9 from 11.7.  UOP 0.6 ml/kg/hr over the last 12 hours. Cr 1.82 (1.3)  Objective: Vital signs in last 24 hours: Temp:  [97.6 F (36.4 C)-100.7 F (38.2 C)] 97.6 F (36.4 C) (04/09 0751) Pulse Rate:  [97-118] 113 (04/09 0700) Resp:  [9-27] 15 (04/09 0700) BP: (122)/(49) 122/49 (04/08 1552) SpO2:  [94 %-100 %] 94 % (04/09 0700) Arterial Line BP: (97-159)/(33-54) 159/53 (04/09 0700) FiO2 (%):  [40 %] 40 % (04/08 1552) Last BM Date : 06/21/23  Intake/Output from previous day: 04/08 0701 - 04/09 0700 In: 1435.9 [I.V.:1085; NG/GT:100; IV Piggyback:250.9] Out: 1060 [Urine:810; Emesis/NG output:250] Intake/Output this shift: No intake/output data recorded.  Vent Mode: PSV;CPAP FiO2 (%):  [40 %] 40 % Set Rate:  [18 bmp] 18 bmp Vt Set:  [510 mL] 510 mL PEEP:  [5 cmH20] 5 cmH20 Pressure Support:  [5 cmH20] 5 cmH20  PE: Gen: Awake and alert, NAD Card:  Tachy. DP palpable b/l Pulm: Rate and effort normal Abd: At least mild distension, gen ttp, hypoactive BS. Incisions w/ glue in place, cdi. R groin soft and inc cdi. NGT in place w/ bilious output  GU: foley in place with straw colored urine in foley bag.  Ext: No LE edema.   Lab Results:  Recent Labs    06/21/23 0427 06/21/23 0620 06/21/23 1207 06/22/23 0424  WBC 3.3*  --   --  6.0  HGB 12.3*   < > 11.7* 10.9*  HCT 37.7*   < > 36.8* 33.3*  PLT 210  --   --  163   < > = values in this interval not displayed.   BMET Recent Labs    06/21/23 0427 06/22/23 0424  NA 140 144  K 4.3 3.7  CL 113* 114*  CO2 18* 18*  GLUCOSE 117* 77  BUN 39* 62*  CREATININE 1.30* 1.82*  CALCIUM 7.8* 7.3*   PT/INR No results for input(s): "LABPROT", "INR" in the last 72  hours. CMP     Component Value Date/Time   NA 144 06/22/2023 0424   NA 138 08/18/2018 1135   K 3.7 06/22/2023 0424   CL 114 (H) 06/22/2023 0424   CO2 18 (L) 06/22/2023 0424   GLUCOSE 77 06/22/2023 0424   BUN 62 (H) 06/22/2023 0424   BUN 19 08/18/2018 1135   CREATININE 1.82 (H) 06/22/2023 0424   CREATININE 1.11 10/09/2021 1646   CALCIUM 7.3 (L) 06/22/2023 0424   PROT 8.4 (H) 06/20/2023 0558   ALBUMIN 3.5 06/20/2023 0558   AST 35 06/20/2023 0558   ALT 14 06/20/2023 0558   ALKPHOS 78 06/20/2023 0558   BILITOT 0.6 06/20/2023 0558   GFRNONAA 37 (L) 06/22/2023 0424   GFRAA 95 08/18/2018 1135   Lipase     Component Value Date/Time   LIPASE 61 (H) 03/09/2022 0812    Studies/Results: DG CHEST PORT 1 VIEW Result Date: 06/21/2023 CLINICAL DATA:  [id number] History of ETT [id number] EXAM: PORTABLE CHEST 1 VIEW COMPARISON:  Chest x-ray 06/20/2023 12:14 a.m. FINDINGS: Interval advancement of an endotracheal tube with tip terminating 6 cm above the carina. Enteric tube courses below the diaphragm with tip and side port overlying the expected region  of the gastric lumen. Right central venous catheter Cordis with tip overlying the expected region of the distal superior vena cava. The heart and mediastinal contours are unchanged. Prominent hilar vasculature. No focal consolidation. Persistent nodular interstitial markings. Blunting of left costophrenic angle with possible underlying trace pleural effusion. No pneumothorax. No acute osseous abnormality. Partially visualized abdominal aortic stent. IMPRESSION: 1. Pulmonary venous congestion. 2. Persistent nodular interstitial markings. 3. Lines and tubes as above. 4. Aortic Atherosclerosis (ICD10-I70.0) and Emphysema (ICD10-J43.9). Electronically Signed   By: Tish Frederickson M.D.   On: 06/21/2023 01:02   DG Chest Port 1 View Result Date: 06/21/2023 CLINICAL DATA:  [id number] Respirator dependence [facility]) [id number] EXAM: PORTABLE CHEST 1 VIEW COMPARISON:  Chest x-ray  03/17/2023, CT chest 06/17/2022, CT abdomen pelvis 06/19/2023 FINDINGS: Endotracheal tube with tip terminating 7 cm above the carina. Enteric tube courses below the hemidiaphragm with tip and side port overlying the expected region of the gastric lumen. Right internal jugular central venous catheter Cordis with tip overlying the expected region of distal superior vena cava. The heart and mediastinal contours are unchanged. Atherosclerotic plaque. Prominent hilar vasculature. No focal consolidation. Persistent nodular interstitial markings. Question trace left pleural effusion. No right pleural effusion. No pneumothorax. No acute osseous abnormality. Partially visualized abdominal aortic stent. IMPRESSION: 1. Pulmonary venous congestion. 2. Persistent nodular interstitial markings. 3. Question trace left pleural effusion. 4. Aortic Atherosclerosis (ICD10-I70.0) and Emphysema (ICD10-J43.9). Electronically Signed   By: Tish Frederickson M.D.   On: 06/21/2023 00:14   DG Abd Portable 1V-Small Bowel Obstruction Protocol-initial, 8 hr delay Result Date: 06/20/2023 CLINICAL DATA:  Reason for exam: SBO 8 hr delay Note: Pt in OR during 8 hour mark: picture taken at 9.5hour delay EXAM: PORTABLE ABDOMEN - 1 VIEW COMPARISON:  CT abdomen pelvis 06/19/2023 FINDINGS: PO contrast likely within the colonic lumen. Gaseous dilatation of the large bowel. Surgical clips noted. Enteric tube courses below the hemidiaphragm with tip and side port overlying the expected region of the gastric lumen. Previously administered intravenous contrast noted partially opacifying the bilateral collecting systems. Aorto bi-iliac stent noted. Severe atherosclerotic plaque. Severe mitral annular calcification. IMPRESSION: 1. PO contrast likely within the colonic lumen. Recommend follow-up to further evaluate migration of PO contrast. 2.  Aortic Atherosclerosis (ICD10-I70.0). Electronically Signed   By: Tish Frederickson M.D.   On: 06/20/2023 21:38    HYBRID OR IMAGING ([facility] ONLY) Result Date: 06/20/2023 There is no interpretation for this exam.  This order is for images obtained during a surgical procedure.  Please See "Surgeries" Tab for more information regarding the procedure.   DG Abd Portable 1V Result Date: 06/20/2023 CLINICAL DATA:  NG tube placement EXAM: PORTABLE ABDOMEN - 1 VIEW COMPARISON:  06/20/2023 FINDINGS: NG tube tip is in the stomach with the side port in the proximal stomach. Nonobstructive bowel gas pattern. Abdominal aortic stent graft noted. Visualized lungs clear. IMPRESSION: NG tube tip in the stomach. Electronically Signed   By: Charlett Nose M.D.   On: 06/20/2023 13:52    Anti-infectives: Anti-infectives (From admission, onward)    Start     Dose/Rate Route Frequency Ordered Stop   06/20/23 1000  piperacillin-tazobactam (ZOSYN) IVPB 3.375 g        3.375 g 12.5 mL/hr over 240 Minutes Intravenous Every 8 hours 06/20/23 8119          Assessment/Plan POD 2 s/p dx laparoscopy by Dr. 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 Karin Lieu on 06/20/23 - Intra-op  noted to have some murky fluid in the pelvis. All of the small bowel was viable without evidence of ischemia or other abnormality. He did appear to have some venous congestion in the mesentery. The right colon, transverse colon, left colon, and rectosigmoid all appeared viable. The liver was viable. Case was handed over to vascular, Dr. Karin Lieu, who performed Stenting of the superior mesenteric artery   - Cont NGT to LIWS - Daily labs - Mobilize, pulm toilet  FEN - NPO. NGT to LIWS. If unable to initiate enteral nutrition by POD 4, could consider TPN then. IVF per primary. PPI VTE - SCDs, on hold for episode for melena. Cont Plavix, ASA per vascular ID - Zosyn Foley - in place, strict I/O, per primary   - Per primary -  VDRF - extubated 4/8 AKI  Acute on chronic anemia - Melenous stools noted post op. Fecal occult positive. Trend  H/H. 10.9/33.3 from 11.7/36.8 this am Hx CAD status post PCI Hx COPD Hx chronic HFrEF Hx AAA s/p repair Hx PAD Hx carotid artery stenosis s/p carotid endarterectomy     LOS: 2 days    Jacinto Halim, [facility] 06/22/2023, 8:10 AM Please see Amion for pager number during day hours 7:00am-4:30pm

## 2023-06-22 NOTE — Progress Notes (Signed)
 NAME:  Erik Syler., MRN:  [id number], DOB:  [birth year], LOS: 2 ADMISSION DATE:  06/19/2023, CONSULTATION DATE:  06/20/2023 REFERRING MD:   Dr. Toniann Fail  CHIEF COMPLAINT:  Abd pain    History of Present Illness:  83 year old male patient with a known history of severe coronary artery disease with prior coronary artery stenting as well as AAA repair complicated further by heart failure with reduced EF COPD and tobacco abuse presented to the emergency room at [facility] on 4/6 with 2-day history of progressive abdominal pain which had been ongoing and intermittent over 2 to 3 months but acutely worse.  In the ER CT angiogram was obtained showed partial bowel obstruction with moderate to severe focal stenosis of the SMA, brought to the emergency room at [facility] and on 4/7 underwent exploratory laparotomy intraoperatively bowel was viable, without evidence of ischemia, He then underwent insertion of stent to superior mesenteric artery by vascular surgery.  Returns to the intensive care on mechanical ventilator critical care asked to evaluate.  Pertinent  Medical History  CAD, status post PCI Chronic HF  AAA s/p repair COPD  Severe PAD Tobacco use   Significant Hospital Events: Including procedures, antibiotic start and stop dates in addition to other pertinent events   06/21/2023 Extubated   Interim History / Subjective:  Patient responds to questions appropriately. Endorses abdominal pain    Objective   Blood pressure (!) 122/49, pulse (!) 113, temperature 97.6 F (36.4 C), temperature source Axillary, resp. rate 14, height 5\' 6"  (1.676 m), weight 50.8 kg, SpO2 95%.    Vent Mode: PSV;CPAP FiO2 (%):  [40 %] 40 % Set Rate:  [18 bmp] 18 bmp Vt Set:  [510 mL] 510 mL PEEP:  [5 cmH20] 5 cmH20 Pressure Support:  [5 cmH20] 5 cmH20   Intake/Output Summary (Last 24 hours) at 06/22/2023 1104 Last data filed at 06/22/2023 1000 Gross per 24 hour  Intake 1196.69 ml  Output 895 ml  Net  301.69 ml   Filed Weights   06/19/23 1836  Weight: 50.8 kg    Examination: General: Laying in bed,responsive  Lungs: Clear lung sounds  Cardiovascular: Regular rate and rhythm.  DP pulses palpable bilaterally Abdomen: Mild diffuse tenderness to palpation. No rebound tenderness  Extremities: Warm and dry.  No lower extremity edema Neuro: Oriented to self, place and person   Resolved Hospital Problem list   Ventilator management with underlying COPD chronic tobacco use Ischemic bowel lactic acidosis status post stent placement in the SMA  Assessment & Plan:  History of CVA PVD Hyperlipidemia BPH Heart failure with mildly reduced ejection fraction Ventilator management with underlying COPD chronic tobacco use - Extubated  - NG tube to suction - Continue bronchodilators  - Will give LR , Scr  1.8 < 1.3 - Continue Plavix and rectal aspirin - Foley in place, strict I's and O's - Possible downgrade  Best Practice (right click and "Reselect all SmartList Selections" daily)   Diet/type: NPO DVT prophylaxis: SCD GI prophylaxis: N/A Lines: Central line and Arterial Line Foley:  Yes, and it is still needed Code Status:  full code Last date of multidisciplinary goals of care discussion [06/21/2023]  Labs   CBC: Recent Labs  Lab 06/19/23 2000 06/20/23 0726 06/20/23 1846 06/21/23 0427 06/21/23 0620 06/21/23 1207 06/22/23 0424  WBC 9.2 20.2* 10.8* 3.3*  --   --  6.0  NEUTROABS  --  18.5*  --   --   --   --   --  HGB 9.8* 10.9* 11.4* 12.3* 11.9* 11.7* 10.9*  HCT 31.0* 36.8* 36.7* 37.7* 37.1* 36.8* 33.3*  MCV 87.6 91.5 89.5 87.1  --   --  86.7  PLT 280 363 222 210  --   --  163    Basic Metabolic Panel: Recent Labs  Lab 06/19/23 2000 06/20/23 0726 06/20/23 1846 06/21/23 0427 06/22/23 0424  NA 135 137  --  140 144  K 4.2 3.9  --  4.3 3.7  CL 103 101  --  113* 114*  CO2 23 20*  --  18* 18*  GLUCOSE 104* 168*  --  117* 77  BUN 22 28*  --  39* 62*  CREATININE 0.98  1.27* 1.18 1.30* 1.82*  CALCIUM 8.8* 9.0  --  7.8* 7.3*  MG  --   --   --  1.8  --   PHOS  --   --   --  4.2  --    GFR: Estimated Creatinine Clearance: 22.5 mL/min (A) (by C-G formula based on SCr of 1.82 mg/dL (H)). Recent Labs  Lab 06/20/23 0726 06/20/23 1025 06/20/23 1306 06/20/23 1755 06/20/23 1846 06/20/23 2022 06/21/23 0427 06/22/23 0424  WBC 20.2*  --   --   --  10.8*  --  3.3* 6.0  LATICACIDVEN 6.2*   < > 7.4* 5.2*  --  2.3* 1.4  --    < > = values in this interval not displayed.    Liver Function Tests: Recent Labs  Lab 06/20/23 0558  AST 35  ALT 14  ALKPHOS 78  BILITOT 0.6  PROT 8.4*  ALBUMIN 3.5   No results for input(s): "LIPASE", "AMYLASE" in the last 168 hours. No results for input(s): "AMMONIA" in the last 168 hours.  ABG    Component Value Date/Time   PHART 7.29 (L) 06/21/2023 0427   PCO2ART 43 06/21/2023 0427   PO2ART 119 (H) 06/21/2023 0427   HCO3 20.7 06/21/2023 0427   TCO2 24 09/10/2013 0220   ACIDBASEDEF 6.0 (H) 06/21/2023 0427   O2SAT 99.4 06/21/2023 0427     Coagulation Profile: No results for input(s): "INR", "PROTIME" in the last 168 hours.  Cardiac Enzymes: No results for input(s): "CKTOTAL", "CKMB", "CKMBINDEX", "TROPONINI" in the last 168 hours.  HbA1C: Hgb A1c MFr Bld  Date/Time Value Ref Range Status  08/19/2022 08:19 AM 4.5 (L) 4.6 - 6.5 % Final    Comment:    Glycemic Control Guidelines for People with Diabetes:Non Diabetic:  <6%Goal of Therapy: <7%Additional Action Suggested:  >8%   07/07/2021 09:51 AM 6.0 4.6 - 6.5 % Final    Comment:    Glycemic Control Guidelines for People with Diabetes:Non Diabetic:  <6%Goal of Therapy: <7%Additional Action Suggested:  >8%     CBG: Recent Labs  Lab 06/21/23 1543 06/21/23 1935 06/22/23 0023 06/22/23 0347 06/22/23 0749  GLUCAP 90 88 79 72 72    Review of Systems:   Unable to assess  Past Medical History:  He,  has a past medical history of Adenomatous colon polyp,  Aortic aneurysm (HCC), BPH (benign prostatic hypertrophy), CAD (coronary artery disease), Carotid artery stenosis, Cataract, CHF (congestive heart failure) (HCC), Chronic systolic heart failure (HCC), Collagen vascular disease (HCC), COPD (chronic obstructive pulmonary disease) (HCC), Coronary artery disease, Diverticulosis, GERD (gastroesophageal reflux disease), Heart murmur, History of colonic polyps, Hyperlipidemia, Hyperplastic colon polyp, Hypertension, Ischemic cardiomyopathy, and RLS (restless legs syndrome).   Surgical History:   Past Surgical History:  Procedure Laterality Date  AORTOGRAM N/A 06/20/2023   Procedure: MESENTERIC AORTOGRAM;  Surgeon: [name], MD;  Location: [facility] OR;  Service: Vascular;  Laterality: N/A;   BUBBLE STUDY  02/03/2022   Procedure: BUBBLE STUDY;  Surgeon: Meriam Sprague, MD;  Location: [facility] ENDOSCOPY;  Service: Cardiovascular;;   cardiac stents  09-2013   CAROTID ENDARTERECTOMY  04/17/2008   right   CAROTID ENDARTERECTOMY  05/30/08   Left   CATARACT EXTRACTION W/PHACO Right 10/05/2018   Procedure: CATARACT EXTRACTION PHACO AND INTRAOCULAR LENS PLACEMENT (IOC), RIGHT;  Surgeon: Galen Manila, MD;  Location: [facility] ORS;  Service: Ophthalmology;  Laterality: Right;  [place] 01:06.4 cde 12.79 Fluid Pack Lot # [id number]   CATARACT EXTRACTION W/PHACO Left 11/02/2018   Procedure: CATARACT EXTRACTION PHACO AND INTRAOCULAR LENS PLACEMENT (IOC), LEFT;  Surgeon: Galen Manila, MD;  Location: [facility] ORS;  Service: Ophthalmology;  Laterality: Left;  [place]  01:10 CDE 11.52 Fluid pack lot # [id number]   CHOLECYSTECTOMY     Gall Bladder   CORONARY ANGIOGRAM  09/11/2013   Procedure: CORONARY ANGIOGRAM;  Surgeon: Lennette Bihari, MD;  Location: [facility] CATH LAB;  Service: Cardiovascular;;   CORONARY ANGIOPLASTY     STENTS   HERNIA REPAIR     INSERTION OF ILIAC STENT N/A 06/20/2023   Procedure: INSERTION VIABAHN 7x29 STENT IN SUPERIOR MESENTERIC ARTERY;  Surgeon: [name], MD;  Location: [facility] OR;  Service: Vascular;  Laterality: N/A;  Insertion Mesenteric Artery Stent   LAPAROSCOPY N/A 06/20/2023   Procedure: LAPAROSCOPY, DIAGNOSTIC;  Surgeon: Violeta Gelinas, MD;  Location: [facility] OR;  Service: General;  Laterality: N/A;   lower aorta aneurysm  05/26/2015   [facility]   PERCUTANEOUS STENT INTERVENTION  09/11/2013   Procedure: PERCUTANEOUS STENT INTERVENTION;  Surgeon: Lennette Bihari, MD;  Location: [facility] CATH LAB;  Service: Cardiovascular;;  DES Prox RCA 3.5x15 xience    TEE WITHOUT CARDIOVERSION N/A 02/03/2022   Procedure: TRANSESOPHAGEAL ECHOCARDIOGRAM (TEE);  Surgeon: Meriam Sprague, MD;  Location: [facility] ENDOSCOPY;  Service: Cardiovascular;  Laterality: N/A;     Social History:   reports that he has been smoking cigarettes. He has a 50 pack-year smoking history. He has never been exposed to tobacco smoke. He uses smokeless tobacco. He reports that he does not drink alcohol and does not use drugs.   Family History:  His family history includes Cancer in his father; Emphysema in his father; Heart attack in his brother; Heart disease in his brother; Hyperlipidemia in his brother, brother, brother, and brother; Hypertension in his mother; Multiple sclerosis in his brother; Pancreatic cancer in his brother. There is no history of Kidney disease, Prostate cancer, Colon cancer, Esophageal cancer, Rectal cancer, or Stomach cancer.   Allergies Allergies  Allergen Reactions   Codeine Anaphylaxis and Swelling    throat swells     Home Medications  Prior to Admission medications   Medication Sig Start Date End Date Taking? Authorizing Provider  acetaminophen (TYLENOL) 500 MG tablet Take 1,000 mg by mouth every 6 (six) hours as needed for moderate pain (pain score 4-6).   Yes [provider]  aspirin EC 81 MG tablet Take 81 mg by mouth every evening.    Yes [provider]  clopidogrel (PLAVIX) 75 MG tablet TAKE 1 TABLET BY MOUTH EVERY DAY 11/03/22  Yes  Laurey Morale, MD  cyanocobalamin (VITAMIN B12) 1000 MCG tablet Take 1,000 mcg by mouth daily.   Yes [provider]  ezetimibe-simvastatin (VYTORIN) 10-40 MG tablet TAKE 1  TABLET BY MOUTH EVERYDAY AT BEDTIME 10/06/22  Yes Bedsole, Amy E, MD  famotidine (PEPCID) 40 MG tablet Take 40 mg by mouth 2 (two) times daily.   Yes [provider]  FARXIGA 10 MG TABS tablet TAKE 1 TABLET BY MOUTH DAILY BEFORE BREAKFAST. 11/29/22  Yes Laurey Morale, MD  ferrous sulfate 325 (65 FE) MG tablet Take 325 mg by mouth daily with breakfast.   Yes [provider]  gabapentin (NEURONTIN) 100 MG capsule Take 1 capsule (100 mg total) by mouth 3 (three) times daily. Patient taking differently: Take 100-200 mg by mouth See admin instructions. Take 100mg  (1 capsule) by mouth every morning and 200mg  (2 capsules) at night. 03/29/23  Yes Bedsole, Amy E, MD  methocarbamol (ROBAXIN) 500 MG tablet TAKE 1 TABLET (500 MG TOTAL) BY MOUTH EVERY DAY AT BEDTIME AS NEEDED FOR MUSCLE SPASM 06/09/23  Yes Bedsole, Amy E, MD  metoprolol succinate (TOPROL-XL) 50 MG 24 hr tablet Take 1 tablet (50 mg total) by mouth daily. 03/29/23  Yes Laurey Morale, MD  mirtazapine (REMERON) 15 MG tablet TAKE 1 TABLET BY MOUTH EVERYDAY AT BEDTIME 03/07/23  Yes Bedsole, Amy E, MD  Omega-3 Fatty Acids (FISH OIL) 1000 MG CAPS Take 1 capsule by mouth daily.   Yes [provider]  rOPINIRole (REQUIP) 3 MG tablet TAKE 1/2 TABLET BY MOUTH IN THE MORNING, 1/2 TABLET MID DAY & TAKE 1 TABLET AT NIGHT 02/08/23  Yes Bedsole, Amy E, MD  spironolactone (ALDACTONE) 25 MG tablet TAKE 1/2 TABLET BY MOUTH DAILY 03/29/23  Yes Laurey Morale, MD  tamsulosin (FLOMAX) 0.4 MG CAPS capsule TAKE 1 CAPSULE BY MOUTH EVERY DAY 09/08/22  Yes Excell Seltzer, MD     Critical care time: 37 Minutes      Kathleen Lime MD [facility] Internal Medicine Program - PGY-1 06/22/2023, 11:04 AM Pager# [phone]

## 2023-06-22 NOTE — Progress Notes (Addendum)
 PHARMACY - TOTAL PARENTERAL NUTRITION CONSULT NOTE   Indication:  malnutrition, suspected ileus  Patient Measurements: Height: 5\' 6"  (167.6 cm) Weight: 50.8 kg (112 lb) IBW/kg (Calculated) : 63.8 TPN AdjBW (KG): 50.8 Body mass index is 18.08 kg/m.  Assessment:  83yoM admitted 4/7 for complaints of acute on chronic abdominal pain (2 - 3 month history, worsened for 2 days prior to admission). Initial CT abdomen/pelvis showed concern for early SBO vs ileus as well as stenosis of celiac artery and SMA, mesenteric stent placed. Underwent diagnostic laparoscopy and mesenteric angiography on 4/7, no bowel ischemia appreciated. All of small bowel viable per surgery note.  Has had weight loss over past several years from 132 lbs to 112 lbs despite normal dietary intake, now has muscle/fat depletion with concern for severe malnutrition on RD exam. Has not received enteral nutrition since admission on 4/7, NG tube to suction. Per vascular surgery team, abdominal pain may be 2/2 edematous bowel so holding tube feeding. Pharmacy consulted for TPN 4/9.  Glucose / Insulin: last A1c 4.5 (June 2024), CBGs <100 past 24h, no insulin usage 4/7 Dexamethasone 10 mg x1 Electrolytes: K 3.7 (from 4.3), Na 144, Cl 114, CO2 down 18, CoCa 7.7, Phos 4.2 (4/8), Mg 1.8 (4/8) Renal: Scr 1.82 (baseline <1), BUN 62 (22 on admission) Hepatic: 4/7 WNL Intake / Output; MIVF: 0.7 mL/kg/h UOP, 250 mL bilious NGT output, dark tarry BM 4/8, FOBT + D5LR @ 100 GI Imaging: 4/6 CT: multiple fluid filled mildly dilated loops of small bowel 4/7 KUB: decreased gaseous distension of small bowel loops, retained stool in R colon 4/7 KUB SBO protocol: contrast within colonic lumen GI Surgeries / Procedures:  4/7 Diagnostic laparoscopy and mesenteric angiography  Central access:  TPN start date:   Nutritional Goals: Goal TPN rate is 65 mL/hr (provides 86 g of protein and 1622 kcals per day)  RD Assessment: Estimated Needs Total  Energy Estimated Needs: 1500-1800 kcal/d Total Protein Estimated Needs: 80-95g/d Total Fluid Estimated Needs: 1.8L/d  Current Nutrition:  NPO  Plan:  Initiate TPN 4/10 (consult received after TPN compounder hours) Monitor TPN labs on Mon/Thurs, PRN; next labs 4/10 AM  Rutherford Nail, PharmD PGY2 Critical Care Pharmacy Resident 06/22/2023,11:45 AM

## 2023-06-22 NOTE — Progress Notes (Signed)
 eLink Physician-Brief Progress Note Patient Name: Erik [name]. DOB: 03-Sep-1940 MRN: [id number]   Date of Service  06/22/2023  HPI/Events of Note  Patient at baseline mental status from assessment after BIPAP was started earlier, saturation 95 % which is improved from 89 % at baseline, respiratory rate 22 - 26, HR  103, MAP 88. NG tube to LIS with some blood streaking in the tubing.  eICU Interventions  No respiratory intervention indicated at this time. Pantoprazole ordered for GI protection.        Thomasene Lot Maryhelen Lindler 06/22/2023, 11:32 PM

## 2023-06-22 NOTE — Progress Notes (Signed)
   06/22/23 2207  BiPAP/CPAP/SIPAP  $ Non-Invasive Ventilator  (S)  Non-Invasive Vent Initial  $ Face Mask Medium Yes  BiPAP/CPAP/SIPAP Pt Type Adult  BiPAP/CPAP/SIPAP SERVO  Mask Type Full face mask  Mask Size Medium  IPAP 15 cmH20  EPAP 5 cmH2O  FiO2 (%) 100 %  Minute Ventilation 18.3  Leak 56  Peak Inspiratory Pressure (PIP) 15  Tidal Volume (Vt) 628  Patient Home Machine No  Auto Titrate No  Press High Alarm 20 cmH2O  Press Low Alarm 2 cmH2O     06/22/23 2207  BiPAP/CPAP/SIPAP  $ Non-Invasive Ventilator  (S)  Non-Invasive Vent Initial  $ Face Mask Medium Yes  BiPAP/CPAP/SIPAP Pt Type Adult  BiPAP/CPAP/SIPAP SERVO  Mask Type Full face mask  Mask Size Medium  IPAP 15 cmH20  EPAP 5 cmH2O  FiO2 (%) 100 %  Minute Ventilation 18.3  Leak 56  Peak Inspiratory Pressure (PIP) 15  Tidal Volume (Vt) 628  Patient Home Machine No  Auto Titrate No  Press High Alarm 20 cmH2O  Press Low Alarm 2 cmH2O   RT placed pt on bipap as per MD order.  Pt still has moderate WOB despite SpO2 increasing from 82% to 94%.  RT will continue to monitor

## 2023-06-23 DIAGNOSIS — J449 Chronic obstructive pulmonary disease, unspecified: Secondary | ICD-10-CM | POA: Diagnosis not present

## 2023-06-23 DIAGNOSIS — I5020 Unspecified systolic (congestive) heart failure: Secondary | ICD-10-CM | POA: Diagnosis not present

## 2023-06-23 DIAGNOSIS — Z720 Tobacco use: Secondary | ICD-10-CM | POA: Diagnosis not present

## 2023-06-23 LAB — COMPREHENSIVE METABOLIC PANEL WITH GFR
ALT: 334 U/L — ABNORMAL HIGH (ref 0–44)
AST: 265 U/L — ABNORMAL HIGH (ref 15–41)
Albumin: 2 g/dL — ABNORMAL LOW (ref 3.5–5.0)
Alkaline Phosphatase: 44 U/L (ref 38–126)
Anion gap: 14 (ref 5–15)
BUN: 73 mg/dL — ABNORMAL HIGH (ref 8–23)
CO2: 20 mmol/L — ABNORMAL LOW (ref 22–32)
Calcium: 8.5 mg/dL — ABNORMAL LOW (ref 8.9–10.3)
Chloride: 115 mmol/L — ABNORMAL HIGH (ref 98–111)
Creatinine, Ser: 2.02 mg/dL — ABNORMAL HIGH (ref 0.61–1.24)
GFR, Estimated: 32 mL/min — ABNORMAL LOW (ref >60)
Glucose, Bld: 94 mg/dL (ref 70–99)
Potassium: 3.8 mmol/L (ref 3.5–5.1)
Sodium: 149 mmol/L — ABNORMAL HIGH (ref 135–145)
Total Bilirubin: 1.1 mg/dL (ref 0.0–1.2)
Total Protein: 6.2 g/dL — ABNORMAL LOW (ref 6.5–8.1)

## 2023-06-23 LAB — POCT I-STAT 7, (LYTES, BLD GAS, ICA,H+H)
Acid-base deficit: 3 mmol/L — ABNORMAL HIGH (ref 0.0–2.0)
Bicarbonate: 21 mmol/L (ref 20.0–28.0)
Calcium, Ion: 1.26 mmol/L (ref 1.15–1.40)
HCT: 35 % — ABNORMAL LOW (ref 39.0–52.0)
Hemoglobin: 11.9 g/dL — ABNORMAL LOW (ref 13.0–17.0)
O2 Saturation: 100 %
Potassium: 3.3 mmol/L — ABNORMAL LOW (ref 3.5–5.1)
Sodium: 150 mmol/L — ABNORMAL HIGH (ref 135–145)
TCO2: 22 mmol/L (ref 22–32)
pCO2 arterial: 34.5 mmHg (ref 32–48)
pH, Arterial: 7.391 (ref 7.35–7.45)
pO2, Arterial: 288 mmHg — ABNORMAL HIGH (ref 83–108)

## 2023-06-23 LAB — COOXEMETRY PANEL
Carboxyhemoglobin: 2.2 % — ABNORMAL HIGH (ref 0.5–1.5)
Methemoglobin: <0.7 % (ref 0.0–1.5)
O2 Saturation: 77.5 %
Total hemoglobin: 11.7 g/dL — ABNORMAL LOW (ref 12.0–16.0)

## 2023-06-23 LAB — PHOSPHORUS
Phosphorus: 4 mg/dL (ref 2.5–4.6)
Phosphorus: 3.9 mg/dL (ref 2.5–4.6)

## 2023-06-23 LAB — CBC
HCT: 37.3 % — ABNORMAL LOW (ref 39.0–52.0)
Hemoglobin: 12.3 g/dL — ABNORMAL LOW (ref 13.0–17.0)
MCH: 27.8 pg (ref 26.0–34.0)
MCHC: 33 g/dL (ref 30.0–36.0)
MCV: 84.4 fL (ref 80.0–100.0)
Platelets: 153 10*3/uL (ref 150–400)
RBC: 4.42 MIL/uL (ref 4.22–5.81)
RDW: 16.5 % — ABNORMAL HIGH (ref 11.5–15.5)
WBC: 6.5 10*3/uL (ref 4.0–10.5)
nRBC: 0 % (ref 0.0–0.2)

## 2023-06-23 LAB — GLUCOSE, CAPILLARY
Glucose-Capillary: 101 mg/dL — ABNORMAL HIGH (ref 70–99)
Glucose-Capillary: 106 mg/dL — ABNORMAL HIGH (ref 70–99)
Glucose-Capillary: 142 mg/dL — ABNORMAL HIGH (ref 70–99)
Glucose-Capillary: 88 mg/dL (ref 70–99)
Glucose-Capillary: 91 mg/dL (ref 70–99)
Glucose-Capillary: 99 mg/dL (ref 70–99)

## 2023-06-23 LAB — MAGNESIUM
Magnesium: 2.3 mg/dL (ref 1.7–2.4)
Magnesium: 2.3 mg/dL (ref 1.7–2.4)

## 2023-06-23 MED ORDER — FUROSEMIDE 10 MG/ML IJ SOLN
40.0000 mg | Freq: Once | INTRAMUSCULAR | Status: AC
  Administered 2023-06-23: 40 mg via INTRAVENOUS
  Filled 2023-06-23: qty 4

## 2023-06-23 MED ORDER — HEPARIN SODIUM (PORCINE) 5000 UNIT/ML IJ SOLN
5000.0000 [IU] | Freq: Three times a day (TID) | INTRAMUSCULAR | Status: DC
  Administered 2023-06-23 – 2023-06-25 (×6): 5000 [IU] via SUBCUTANEOUS
  Filled 2023-06-23 (×6): qty 1

## 2023-06-23 MED ORDER — QUETIAPINE FUMARATE 25 MG PO TABS
25.0000 mg | ORAL_TABLET | Freq: Two times a day (BID) | ORAL | Status: DC
  Administered 2023-06-23 – 2023-06-28 (×10): 25 mg
  Filled 2023-06-23 (×10): qty 1

## 2023-06-23 MED ORDER — ROPINIROLE HCL 1 MG PO TABS
1.5000 mg | ORAL_TABLET | ORAL | Status: DC
  Administered 2023-06-23 – 2023-06-28 (×11): 1.5 mg
  Filled 2023-06-23 (×5): qty 1
  Filled 2023-06-23: qty 3
  Filled 2023-06-23 (×3): qty 1
  Filled 2023-06-23: qty 3
  Filled 2023-06-23: qty 1
  Filled 2023-06-23: qty 3
  Filled 2023-06-23: qty 1

## 2023-06-23 MED ORDER — ROPINIROLE HCL 1 MG PO TABS
3.0000 mg | ORAL_TABLET | Freq: Every day | ORAL | Status: DC
  Administered 2023-06-23 – 2023-06-27 (×5): 3 mg
  Filled 2023-06-23: qty 3
  Filled 2023-06-23 (×2): qty 6
  Filled 2023-06-23 (×3): qty 3
  Filled 2023-06-23: qty 6
  Filled 2023-06-23: qty 3

## 2023-06-23 MED ORDER — DEXMEDETOMIDINE HCL IN NACL 400 MCG/100ML IV SOLN
0.0000 ug/kg/h | INTRAVENOUS | Status: AC
  Administered 2023-06-23: 0.3 ug/kg/h via INTRAVENOUS
  Administered 2023-06-24: 0.2 ug/kg/h via INTRAVENOUS
  Filled 2023-06-23 (×2): qty 100

## 2023-06-23 MED ORDER — ASPIRIN 81 MG PO CHEW
81.0000 mg | CHEWABLE_TABLET | Freq: Every day | ORAL | Status: DC
  Administered 2023-06-23 – 2023-06-25 (×3): 81 mg
  Filled 2023-06-23 (×3): qty 1

## 2023-06-23 MED ORDER — TRAVASOL 10 % IV SOLN
INTRAVENOUS | Status: DC
  Filled 2023-06-23: qty 594

## 2023-06-23 MED ORDER — ORAL CARE MOUTH RINSE: 15.0000 mL | OROMUCOSAL | Status: DC | PRN

## 2023-06-23 MED ORDER — THIAMINE MONONITRATE 100 MG PO TABS
100.0000 mg | ORAL_TABLET | Freq: Every day | ORAL | Status: DC
  Administered 2023-06-24 – 2023-06-28 (×5): 100 mg
  Filled 2023-06-23 (×5): qty 1

## 2023-06-23 MED ORDER — LORAZEPAM 2 MG/ML IJ SOLN
INTRAMUSCULAR | Status: AC
Start: 2023-06-23 — End: 2023-06-23
  Filled 2023-06-23: qty 1

## 2023-06-23 MED ORDER — TRAVASOL 10 % IV SOLN
INTRAVENOUS | Status: AC
  Filled 2023-06-23: qty 594

## 2023-06-23 MED ORDER — INSULIN ASPART 100 UNIT/ML IJ SOLN
0.0000 [IU] | Freq: Four times a day (QID) | INTRAMUSCULAR | Status: DC
  Administered 2023-06-24 – 2023-06-27 (×12): 1 [IU] via SUBCUTANEOUS
  Administered 2023-06-27: 2 [IU] via SUBCUTANEOUS
  Administered 2023-06-27: 1 [IU] via SUBCUTANEOUS
  Administered 2023-06-28 (×2): 2 [IU] via SUBCUTANEOUS

## 2023-06-23 MED ORDER — ORAL CARE MOUTH RINSE
15.0000 mL | OROMUCOSAL | Status: DC
  Administered 2023-06-23 – 2023-06-24 (×5): 15 mL via OROMUCOSAL

## 2023-06-23 MED ORDER — LORAZEPAM 2 MG/ML IJ SOLN
2.0000 mg | Freq: Once | INTRAMUSCULAR | Status: AC
  Administered 2023-06-23: 2 mg via INTRAVENOUS

## 2023-06-23 MED ORDER — POTASSIUM CHLORIDE 20 MEQ PO PACK
40.0000 meq | PACK | Freq: Once | ORAL | Status: AC
  Administered 2023-06-23: 40 meq
  Filled 2023-06-23: qty 2

## 2023-06-23 MED ORDER — THIAMINE HCL 100 MG/ML IJ SOLN
100.0000 mg | Freq: Every day | INTRAMUSCULAR | Status: AC
  Administered 2023-06-23: 100 mg via INTRAVENOUS
  Filled 2023-06-23: qty 2

## 2023-06-23 MED ORDER — LACTATED RINGERS IV SOLN: INTRAVENOUS | Status: DC

## 2023-06-23 NOTE — Progress Notes (Signed)
 PHARMACY - TOTAL PARENTERAL NUTRITION CONSULT NOTE   Indication:  malnutrition, suspected ileus  Patient Measurements: Height: 5\' 6"  (167.6 cm) Weight: 51.5 kg (113 lb 8.6 oz) IBW/kg (Calculated) : 63.8 TPN AdjBW (KG): 50.8 Body mass index is 18.33 kg/m.  Assessment:  83yoM admitted 4/7 for complaints of acute on chronic abdominal pain (2 - 3 month history, worsened for 2 days prior to admission). Initial CT abdomen/pelvis showed concern for early SBO vs ileus as well as stenosis of celiac artery and SMA, mesenteric stent placed. Underwent diagnostic laparoscopy and mesenteric angiography on 4/7, no bowel ischemia appreciated. All of small bowel viable per surgery note.  Has had weight loss over past several years from 132 lbs to 112 lbs despite normal dietary intake, now has muscle/fat depletion with concern for severe malnutrition on RD exam. Has not received enteral nutrition since admission on 4/7, NG tube to suction. Per vascular surgery team, abdominal pain may be 2/2 edematous bowel so holding tube feeding. Pharmacy consulted for TPN 4/9.  Glucose / Insulin: last A1c 4.5 (June 2024), CBGs <100 past 24h, no insulin usage 4/7 Dexamethasone 10 mg x1 Electrolytes: K 3.8 (from 3.5 s/p furosemide IV x2 and 40 mEq IV replacement), Na 149, Cl 115, CO2 20, CoCa 10 (s/p 2g for iCa 1.09), Phos 4, Mg 2.3 4/9 D5LR @ 100 x 6 hours with PM labs to assess for refeeding - in total received 33g  Renal: Scr up 2.02 (baseline <1), BUN up 73 (22 on admission) Hepatic: AST 265, ALT 334 (both up from normal limits), Albumin 2.0, Tbili 1.1  Intake / Output; MIVF: 2.1 mL/kg/h UOP, 50 mL bilious NGT output, dark tarry BM 4/8, FOBT+; LBM 4/9 (small) x1 LR @ 50  GI Imaging: 4/6 CT: multiple fluid filled mildly dilated loops of small bowel 4/7 KUB: decreased gaseous distension of small bowel loops, retained stool in R colon 4/7 KUB SBO protocol: contrast within colonic lumen GI Surgeries / Procedures:  4/7  Diagnostic laparoscopy and mesenteric angiography  Central access: R internal jugular 4/7 TPN start date: 4/10  Nutritional Goals: Goal TPN rate is 65 mL/hr (provides 86 g of protein and 1622 kcals per day)  RD Assessment: Estimated Needs Total Energy Estimated Needs: 1500-1800 kcal/d Total Protein Estimated Needs: 80-95g/d Total Fluid Estimated Needs: 1.8L/d  Current Nutrition:  NPO  Plan:  Start TPN at ~2/3 of goal rate, 45 mL/hr at 1800 (provides 59 g protein, 1122 kcal) Electrolytes in TPN: Na 50 mEq/L, K 20 mEq/L, Ca 5 mEq/L, Mg 0 mEq/L, and Phos 0 mmol/L. Max acetate Primary team supplementing Kcl 40 mEq per tube Add standard MVI and trace elements to TPN Initiate Sensitive q6h SSI and adjust as needed  Stop IVF at 1800 with initiation of TPN F/u initiation and tolerance of TF, per primary team unlikely to tolerate or get adequate calories enterally Monitor TPN labs on Mon/Thurs, PRN; next labs 4/11 AM  Rutherford Nail, PharmD PGY2 Critical Care Pharmacy Resident 06/23/2023,7:54 AM

## 2023-06-23 NOTE — Progress Notes (Signed)
 eLink Physician-Brief Progress Note Patient Name: Erik [name]. DOB: January 11, 1941 MRN: [id number]   Date of Service  06/23/2023  HPI/Events of Note  Patient with agitated delirium.  eICU Interventions  Low dose Precedex gtt ordered.        Thomasene Lot Johathan Province 06/23/2023, 5:10 AM

## 2023-06-23 NOTE — Progress Notes (Addendum)
 NAME:  Erik Persing., MRN:  [id number], DOB:  1940-04-23, LOS: 3 ADMISSION DATE:  06/19/2023, CONSULTATION DATE:  06/20/2023 REFERRING MD:   Dr. Toniann Fail  CHIEF COMPLAINT:  Abd pain    History of Present Illness:  83 year old male patient with a known history of severe coronary artery disease with prior coronary artery stenting as well as AAA repair complicated further by heart failure with reduced EF COPD and tobacco abuse presented to the emergency room at [facility] on 4/6 with 2-day history of progressive abdominal pain which had been ongoing and intermittent over 2 to 3 months but acutely worse.  In the ER CT angiogram was obtained showed partial bowel obstruction with moderate to severe focal stenosis of the SMA, brought to the emergency room at [facility] and on 4/7 underwent exploratory laparotomy intraoperatively bowel was viable, without evidence of ischemia, He then underwent insertion of stent to superior mesenteric artery by vascular surgery.  Returns to the intensive care on mechanical ventilator critical care asked to evaluate.  Pertinent  Medical History  CAD, status post PCI Chronic HF  AAA s/p repair COPD  Severe PAD Tobacco use   Significant Hospital Events: Including procedures, antibiotic start and stop dates in addition to other pertinent events   4/8 Extubated  4/9 Stable  4/10 Placed on BiPAP   Interim History / Subjective:  More agitated today  Objective   Blood pressure (!) 142/67, pulse 89, temperature 97.8 F (36.6 C), temperature source Axillary, resp. rate (!) 24, height 5\' 6"  (1.676 m), weight 51.5 kg, SpO2 98%. CVP:  [6 mmHg] 6 mmHg  FiO2 (%):  [80 %-100 %] 100 % PEEP:  [5 cmH20] 5 cmH20   Intake/Output Summary (Last 24 hours) at 06/23/2023 0955 Last data filed at 06/23/2023 0900 Gross per 24 hour  Intake 1437.31 ml  Output 3130 ml  Net -1692.69 ml   Filed Weights   06/19/23 1836 06/23/23 0426  Weight: 50.8 kg 51.5 kg     Examination: General: On BiPAP, mildly agitated  Lungs: Mild wheezes and diffuse crackles  Cardiovascular: Regular rate and rhythm.  DP pulses palpable bilaterally Abdomen: Mild diffuse tenderness to palpation. No rebound tenderness  Extremities: Warm and dry.  No lower extremity edema  Resolved Hospital Problem list   Ventilator management with underlying COPD chronic tobacco use Ischemic bowel lactic acidosis status post stent placement in the SMA  Assessment & Plan:  History of CVA PVD Hyperlipidemia BPH Heart failure with mildly reduced ejection fraction Ventilator management with underlying COPD chronic tobacco use  - On BiPAP, wean off as tolerated  - Repeat VBG - CXR - Continue Zosyn 3.375 g IV q8h extended infusion - Start TPN at 1800 today  - LR 27ml/hr. Cr worsened overnight from 1.74 >>> 2.02. Will hold off Lasix  - Continue bronchodilators  - Reduce work of breathing  with Dilaudid and Precedex - Continue Plavix and rectal aspirin - Foley in place, strict I's and O's - Will hold off on downgrading at this time  Best Practice (right click and "Reselect all SmartList Selections" daily)   Diet/type: NPO DVT prophylaxis: SCD GI prophylaxis: N/A Lines: Central line and Arterial Line Foley:  Yes, and it is still needed Code Status:  full code Last date of multidisciplinary goals of care discussion [06/21/2023]  Labs   CBC: Recent Labs  Lab 06/20/23 0726 06/20/23 1511 06/20/23 1846 06/21/23 0427 06/21/23 6213 06/22/23 0424 06/22/23 1842 06/22/23 2108 06/23/23 0500 06/23/23 0865  WBC 20.2*  --  10.8* 3.3*  --  6.0  --   --  6.5  --   NEUTROABS 18.5*  --   --   --   --   --   --   --   --   --   HGB 10.9*   < > 11.4* 12.3*   < > 10.9* 11.2* 10.9* 12.3* 11.9*  HCT 36.8*   < > 36.7* 37.7*   < > 33.3* 33.0* 32.0* 37.3* 35.0*  MCV 91.5  --  89.5 87.1  --  86.7  --   --  84.4  --   PLT 363  --  222 210  --  163  --   --  153  --    < > = values in this  interval not displayed.    Basic Metabolic Panel: Recent Labs  Lab 06/20/23 0726 06/20/23 1511 06/20/23 1846 06/21/23 0427 06/22/23 0424 06/22/23 1700 06/22/23 1842 06/22/23 2108 06/23/23 0500 06/23/23 0859  NA 137   < >  --  140 144 146*  144 148* 150* 149* 150*  K 3.9   < >  --  4.3 3.7 3.5  3.5 3.5 3.4* 3.8 3.3*  CL 101  --   --  113* 114* 115*  114*  --   --  115*  --   CO2 20*  --   --  18* 18* 19*  19*  --   --  20*  --   GLUCOSE 168*  --   --  117* 77 119*  119*  --   --  94  --   BUN 28*  --   --  39* 62* 67*  68*  --   --  73*  --   CREATININE 1.27*  --  1.18 1.30* 1.82* 1.74*  1.75*  --   --  2.02*  --   CALCIUM 9.0  --   --  7.8* 7.3* 7.7*  7.6*  --   --  8.5*  --   MG  --   --   --  1.8  --  2.1  --   --  2.3  --   PHOS  --   --   --  4.2  --  3.6  --   --  4.0  --    < > = values in this interval not displayed.   GFR: Estimated Creatinine Clearance: 20.5 mL/min (A) (by C-G formula based on SCr of 2.02 mg/dL (H)). Recent Labs  Lab 06/20/23 1306 06/20/23 1755 06/20/23 1846 06/20/23 2022 06/21/23 0427 06/22/23 0424 06/23/23 0500  WBC  --   --  10.8*  --  3.3* 6.0 6.5  LATICACIDVEN 7.4* 5.2*  --  2.3* 1.4  --   --     Liver Function Tests: Recent Labs  Lab 06/20/23 0558 06/22/23 1700 06/23/23 0500  AST 35  --  265*  ALT 14  --  334*  ALKPHOS 78  --  44  BILITOT 0.6  --  1.1  PROT 8.4*  --  6.2*  ALBUMIN 3.5 1.9* 2.0*   No results for input(s): "LIPASE", "AMYLASE" in the last 168 hours. No results for input(s): "AMMONIA" in the last 168 hours.  ABG    Component Value Date/Time   PHART 7.391 06/23/2023 0859   PCO2ART 34.5 06/23/2023 0859   PO2ART 288 (H) 06/23/2023 0859   HCO3 21.0 06/23/2023 0859   TCO2 22 06/23/2023  0859   ACIDBASEDEF 3.0 (H) 06/23/2023 0859   O2SAT 77.5 06/23/2023 0915     Coagulation Profile: No results for input(s): "INR", "PROTIME" in the last 168 hours.  Cardiac Enzymes: No results for input(s):  "CKTOTAL", "CKMB", "CKMBINDEX", "TROPONINI" in the last 168 hours.  HbA1C: Hgb A1c MFr Bld  Date/Time Value Ref Range Status  08/19/2022 08:19 AM 4.5 (L) 4.6 - 6.5 % Final    Comment:    Glycemic Control Guidelines for People with Diabetes:Non Diabetic:  <6%Goal of Therapy: <7%Additional Action Suggested:  >8%   07/07/2021 09:51 AM 6.0 4.6 - 6.5 % Final    Comment:    Glycemic Control Guidelines for People with Diabetes:Non Diabetic:  <6%Goal of Therapy: <7%Additional Action Suggested:  >8%     CBG: Recent Labs  Lab 06/22/23 1527 06/22/23 2011 06/22/23 2349 06/23/23 0344 06/23/23 0729  GLUCAP 122* 97 95 88 91    Review of Systems:   Unable to assess  Past Medical History:  He,  has a past medical history of Adenomatous colon polyp, Aortic aneurysm (HCC), BPH (benign prostatic hypertrophy), CAD (coronary artery disease), Carotid artery stenosis, Cataract, CHF (congestive heart failure) (HCC), Chronic systolic heart failure (HCC), Collagen vascular disease (HCC), COPD (chronic obstructive pulmonary disease) (HCC), Coronary artery disease, Diverticulosis, GERD (gastroesophageal reflux disease), Heart murmur, History of colonic polyps, Hyperlipidemia, Hyperplastic colon polyp, Hypertension, Ischemic cardiomyopathy, and RLS (restless legs syndrome).   Surgical History:   Past Surgical History:  Procedure Laterality Date   AORTOGRAM N/A 06/20/2023   Procedure: MESENTERIC AORTOGRAM;  Surgeon: [name], MD;  Location: [facility] OR;  Service: Vascular;  Laterality: N/A;   BUBBLE STUDY  02/03/2022   Procedure: BUBBLE STUDY;  Surgeon: Meriam Sprague, MD;  Location: [facility] ENDOSCOPY;  Service: Cardiovascular;;   cardiac stents  09-2013   CAROTID ENDARTERECTOMY  04/17/2008   right   CAROTID ENDARTERECTOMY  05/30/08   Left   CATARACT EXTRACTION W/PHACO Right 10/05/2018   Procedure: CATARACT EXTRACTION PHACO AND INTRAOCULAR LENS PLACEMENT (IOC), RIGHT;  Surgeon: Galen Manila, MD;   Location: [facility] ORS;  Service: Ophthalmology;  Laterality: Right;  [place] 01:06.4 cde 12.79 Fluid Pack Lot # [id number]   CATARACT EXTRACTION W/PHACO Left 11/02/2018   Procedure: CATARACT EXTRACTION PHACO AND INTRAOCULAR LENS PLACEMENT (IOC), LEFT;  Surgeon: Galen Manila, MD;  Location: [facility] ORS;  Service: Ophthalmology;  Laterality: Left;  [place]  01:10 CDE 11.52 Fluid pack lot # [id number]   CHOLECYSTECTOMY     Gall Bladder   CORONARY ANGIOGRAM  09/11/2013   Procedure: CORONARY ANGIOGRAM;  Surgeon: Lennette Bihari, MD;  Location: [facility] CATH LAB;  Service: Cardiovascular;;   CORONARY ANGIOPLASTY     STENTS   HERNIA REPAIR     INSERTION OF ILIAC STENT N/A 06/20/2023   Procedure: INSERTION VIABAHN 7x29 STENT IN SUPERIOR MESENTERIC ARTERY;  Surgeon: [name], MD;  Location: [facility] OR;  Service: Vascular;  Laterality: N/A;  Insertion Mesenteric Artery Stent   LAPAROSCOPY N/A 06/20/2023   Procedure: LAPAROSCOPY, DIAGNOSTIC;  Surgeon: Violeta Gelinas, MD;  Location: [facility] OR;  Service: General;  Laterality: N/A;   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 He has a 50 pack-year smoking history. He has never been exposed to tobacco smoke. He uses smokeless tobacco. He reports that he does not drink alcohol and does not use drugs.   Family History:  His family history includes Cancer in his father; Emphysema in his father; Heart attack in his brother; Heart disease in his brother; Hyperlipidemia in his brother, brother, brother, and brother; Hypertension  in his mother; Multiple sclerosis in his brother; Pancreatic cancer in his brother. There is no history of Kidney disease, Prostate cancer, Colon cancer, Esophageal cancer, Rectal cancer, or Stomach cancer.   Allergies Allergies  Allergen Reactions   Codeine Anaphylaxis and Swelling    throat swells     Home Medications  Prior to Admission medications   Medication Sig Start Date End Date Taking? Authorizing Provider  acetaminophen (TYLENOL) 500 MG tablet Take 1,000 mg by mouth every 6 (six) hours as needed for moderate pain (pain score 4-6).   Yes [provider]  aspirin EC 81 MG tablet Take 81 mg by mouth every evening.    Yes [provider]  clopidogrel (PLAVIX) 75 MG tablet TAKE 1 TABLET BY MOUTH EVERY DAY 11/03/22  Yes Laurey Morale, MD  cyanocobalamin (VITAMIN B12) 1000 MCG tablet Take 1,000 mcg by mouth daily.   Yes [provider]  ezetimibe-simvastatin (VYTORIN) 10-40 MG tablet TAKE 1 TABLET BY MOUTH EVERYDAY AT BEDTIME 10/06/22  Yes Bedsole, Amy E, MD  famotidine (PEPCID) 40 MG tablet Take 40 mg by mouth 2 (two) times daily.   Yes [provider]  FARXIGA 10 MG TABS tablet TAKE 1 TABLET BY MOUTH DAILY BEFORE BREAKFAST. 11/29/22  Yes Laurey Morale, MD  ferrous sulfate 325 (65 FE) MG tablet Take 325 mg by mouth daily with breakfast.   Yes [provider]  gabapentin (NEURONTIN) 100 MG capsule Take 1 capsule (100 mg total) by mouth 3 (three) times daily. Patient taking differently: Take 100-200 mg by mouth See admin instructions. Take 100mg  (1 capsule) by mouth every morning and 200mg  (2 capsules) at night. 03/29/23  Yes Bedsole, Amy E, MD  methocarbamol (ROBAXIN) 500 MG tablet TAKE 1 TABLET (500 MG TOTAL) BY MOUTH EVERY DAY AT BEDTIME AS NEEDED FOR MUSCLE SPASM 06/09/23  Yes Bedsole, Amy E, MD  metoprolol succinate (TOPROL-XL) 50 MG 24 hr tablet Take 1 tablet (50 mg total) by mouth daily. 03/29/23  Yes Laurey Morale, MD  mirtazapine  (REMERON) 15 MG tablet TAKE 1 TABLET BY MOUTH EVERYDAY AT BEDTIME 03/07/23  Yes Bedsole, Amy E, MD  Omega-3 Fatty Acids (FISH OIL) 1000 MG CAPS Take 1 capsule by mouth daily.   Yes [provider]  rOPINIRole (REQUIP) 3 MG tablet TAKE 1/2 TABLET BY MOUTH IN THE MORNING, 1/2 TABLET MID DAY & TAKE 1 TABLET AT NIGHT 02/08/23  Yes Bedsole, Amy E, MD  spironolactone (ALDACTONE) 25 MG tablet TAKE 1/2 TABLET BY MOUTH DAILY 03/29/23  Yes Laurey Morale, MD  tamsulosin (FLOMAX) 0.4 MG CAPS capsule TAKE 1 CAPSULE BY MOUTH EVERY DAY 09/08/22  Yes Excell Seltzer, MD     Critical care time: 42 Minutes      Kathleen Lime MD [facility] Internal Medicine Program - PGY-1 06/23/2023, 9:55 AM Pager# [phone]

## 2023-06-23 NOTE — Progress Notes (Signed)
  Progress Note    06/23/2023 8:31 AM 3 Days Post-Op  Subjective:  somewhat agitated.  Currently with BiPAP   Vitals:   06/23/23 0741 06/23/23 0800  BP:  (!) 112/54  Pulse: (!) 104 88  Resp: (!) 27 (!) 22  Temp:    SpO2: 97% 98%   Physical Exam: Incisions:  R groin without hematoma Extremities:  palpable R PT pulse Abdomen:  soft, some distension, diffusely tender; incisions c/d/i Neurologic: agitated  CBC    Component Value Date/Time   WBC 6.5 06/23/2023 0500   RBC 4.42 06/23/2023 0500   HGB 12.3 (L) 06/23/2023 0500   HGB 10.9 (L) 05/25/2023 1032   HCT 37.3 (L) 06/23/2023 0500   PLT 153 06/23/2023 0500   PLT 303 05/25/2023 1032   MCV 84.4 06/23/2023 0500   MCH 27.8 06/23/2023 0500   MCHC 33.0 06/23/2023 0500   RDW 16.5 (H) 06/23/2023 0500   LYMPHSABS 0.3 (L) 06/20/2023 0726   MONOABS 1.3 (H) 06/20/2023 0726   EOSABS 0.0 06/20/2023 0726   BASOSABS 0.1 06/20/2023 0726    BMET    Component Value Date/Time   NA 149 (H) 06/23/2023 0500   NA 138 08/18/2018 1135   K 3.8 06/23/2023 0500   CL 115 (H) 06/23/2023 0500   CO2 20 (L) 06/23/2023 0500   GLUCOSE 94 06/23/2023 0500   BUN 73 (H) 06/23/2023 0500   BUN 19 08/18/2018 1135   CREATININE 2.02 (H) 06/23/2023 0500   CREATININE 1.11 10/09/2021 1646   CALCIUM 8.5 (L) 06/23/2023 0500   GFRNONAA 32 (L) 06/23/2023 0500   GFRAA 95 08/18/2018 1135    INR    Component Value Date/Time   INR 1.1 02/21/2023 1508     Intake/Output Summary (Last 24 hours) at 06/23/2023 0831 Last data filed at 06/23/2023 0800 Gross per 24 hour  Intake 1430.64 ml  Output 2905 ml  Net -1474.36 ml     Assessment/Plan:  83 y.o. male is s/p SMA stenting 3 Days Post-Op   RLE well perfused with palpable PT pulse; R groin cath site without hematoma Generalized abd pain especially with palpation but not severely distended; incisions look good.  continue NG per Surgery team AKI Cr >2; lasix given for pulmonary edema on CXR; continue to  monitor Continue aspirin plavix as able   Emilie Rutter, PA-C [facility] [phone] 06/23/2023 8:31 AM

## 2023-06-23 NOTE — Progress Notes (Addendum)
 eLink Physician-Brief Progress Note Patient Name: Erik [name]. DOB: [birth year] MRN: [id number]   Date of Service  06/23/2023  HPI/Events of Note  Earlier CXR consistent with pulmonary edema.  eICU Interventions  Lasix 40 mg iv x 1 ordered.         Erik [name] 06/23/2023, 2:46 AM

## 2023-06-23 NOTE — Progress Notes (Signed)
 Nutrition Follow-up  DOCUMENTATION CODES:  Underweight, Severe malnutrition in context of chronic illness  INTERVENTION:  TPN - management per pharmacy Advance cautiously as pt is at risk for refeeding.  Would not recommend weaning TPN until pt is tolerating enteral feeds and advanced to the point where he is meeting 75% of his needs  Monitor magnesium and phosphorus every 12 hours x 4 occurrences, MD to replete as needed, as pt is at risk for refeeding syndrome given severe malnutrition and underweight BMI Thiamine 100mg  x 7 days, 1 day of IV and 6 days oral  NUTRITION DIAGNOSIS:  Severe Malnutrition related to chronic illness (CHF) as evidenced by severe muscle depletion, severe fat depletion. - remains applicable  GOAL:  Patient will meet greater than or equal to 90% of their needs - progressing, TPN to be initiated tonight  MONITOR:  I & O's, Vent status, Labs, Weight trends  REASON FOR ASSESSMENT:  Consult New TPN/TNA  ASSESSMENT:  Pt with hx of CAD, HLD, HTN, CHF, and GERD presented to ED with abdominal pain that has been ongoing for months but significantly worse on the day of admission.  4/6 - presented to ED 4/7 - OR, s/p dx laparoscopy, stent placed in SMA by vascular surgery, left intubated after surgery 4/8 - extubated 4/9 - increased delirium and WOB, on BiPAP 4/10 - TPN to start tonight   Pt resting in bed at the time of assessment. Wife at bedside. Pt had difficult night with delirium and tenuous respiratory status. Now on BiPAP and RN reports it is not likely it will be discontinued today. NGT remains in place. Per Surgery team, ok to begin enteral feeds from their perspective. However, CCM requests to wait until the AM due to respiratory status. Giving meds per tube over night to assess if pt will tolerate. If so, will start trickle feeds in AM.   TPN ordered to start tonight at rate of 69mL/h. Goal TPN rate is 65 mL/hr (provides 86 g of protein and 1622 kcals  per day). Pt at high risk for refeeding. Discussed with RPH on unit, ordered 1 dose of IV thiamine and 6 doses of per tube thiamine. Will monitor electrolytes closely and replete as needed. Would recommend continuing TPN until enteral feeds are in place and being tolerated/advancing.   Discussed nutrition plan with wife, has no concerns at this time. Is hopeful that once he gets a little nutrition he will begin to improve.     Admit weight: 50.8 kg   Current weight: 51.5 kg   Intake/Output Summary (Last 24 hours) at 06/23/2023 1420 Last data filed at 06/23/2023 1400 Gross per 24 hour  Intake 1109.83 ml  Output 3075 ml  Net -1965.17 ml  Net IO Since Admission: 2,842.78 mL [06/23/23 1420]  Drains/Lines: NGT 18 Fr.  UOP Right IJ, Double Lumen Art Line  Nutritionally Relevant Medications: Scheduled Meds:  pantoprazole IV  40 mg Intravenous Q24H   Continuous Infusions:  dexmedetomidine (PRECEDEX) IV infusion 0.7 mcg/kg/hr (06/23/23 0900)   lactated ringers 50 mL/hr at 06/23/23 0938   piperacillin-tazobactam (ZOSYN)  IV 12.5 mL/hr at 06/23/23 0900   PRN Meds: ondansetron  Labs Reviewed: Na 150, chloride 115 K 3.3 BUN 73, creatinine 2.02 CBG ranges from 72-122 mg/dL over the last 24 hours HgbA1c 4.5% (08/19/22)   NUTRITION - FOCUSED PHYSICAL EXAM: Flowsheet Row Most Recent Value  Orbital Region Severe depletion  Upper Arm Region Severe depletion  Thoracic and Lumbar Region Severe depletion  Buccal  Region Moderate depletion  Temple Region Severe depletion  Clavicle Bone Region Moderate depletion  Clavicle and Acromion Bone Region Severe depletion  Scapular Bone Region Moderate depletion  Dorsal Hand Severe depletion  Patellar Region Severe depletion  Anterior Thigh Region Severe depletion  Posterior Calf Region Severe depletion  Edema (RD Assessment) None  Hair Reviewed  Eyes Reviewed  Mouth Reviewed  Skin Reviewed  Nails Reviewed    Diet Order:   Diet Order              Diet NPO time specified  Diet effective now                   EDUCATION NEEDS:  Not appropriate for education at this time  Skin:  Skin Assessment: Reviewed RN Assessment  Last BM:  4/10 - type 6 and 7 (3x today)  Height:  Ht Readings from Last 1 Encounters:  06/19/23 5\' 6"  (1.676 m)   Weight:  Wt Readings from Last 1 Encounters:  06/23/23 51.5 kg    Ideal Body Weight:  64.5 kg  BMI:  Body mass index is 18.33 kg/m.  Estimated Nutritional Needs:  Kcal:  1500-1800 kcal/d Protein:  80-95g/d Fluid:  1.8L/d    Greig Castilla, RD, LDN Registered Dietitian II Please reach out via secure chat

## 2023-06-23 NOTE — Progress Notes (Addendum)
 Patient ID: Erik Lope., male   DOB: 24-Jun-1940, 83 y.o.   MRN: [id number] 3 Days Post-Op    Subjective: Delirium, on BiPAP ROS negative except as listed above. Objective: Vital signs in last 24 hours: Temp:  [97.8 F (36.6 C)-99 F (37.2 C)] 97.8 F (36.6 C) (04/10 0731) Pulse Rate:  [88-122] 88 (04/10 0800) Resp:  [13-34] 22 (04/10 0800) BP: (90-166)/(54-94) 112/54 (04/10 0800) SpO2:  [83 %-99 %] 98 % (04/10 0800) Arterial Line BP: (125-292)/(41-288) 125/41 (04/10 0800) FiO2 (%):  [80 %-100 %] 100 % (04/10 0741) Weight:  [51.5 kg] 51.5 kg (04/10 0426) Last BM Date : 06/23/23  Intake/Output from previous day: 04/09 0701 - 04/10 0700 In: 1451.6 [I.V.:862.5; NG/GT:30; IV Piggyback:559.1] Out: 2655 [Urine:2605; Emesis/NG output:50] Intake/Output this shift: Total I/O In: 21.5 [I.V.:9; IV Piggyback:12.4] Out: 375 [Urine:375]  GI: soft, min tend, incisions OK  Lab Results: CBC  Recent Labs    06/22/23 0424 06/22/23 1842 06/22/23 2108 06/23/23 0500  WBC 6.0  --   --  6.5  HGB 10.9*   < > 10.9* 12.3*  HCT 33.3*   < > 32.0* 37.3*  PLT 163  --   --  153   < > = values in this interval not displayed.   BMET Recent Labs    06/22/23 1700 06/22/23 1842 06/22/23 2108 06/23/23 0500  NA 146*  144   < > 150* 149*  K 3.5  3.5   < > 3.4* 3.8  CL 115*  114*  --   --  115*  CO2 19*  19*  --   --  20*  GLUCOSE 119*  119*  --   --  94  BUN 67*  68*  --   --  73*  CREATININE 1.74*  1.75*  --   --  2.02*  CALCIUM 7.7*  7.6*  --   --  8.5*   < > = values in this interval not displayed.   PT/INR No results for input(s): "LABPROT", "INR" in the last 72 hours. ABG Recent Labs    06/22/23 1842 06/22/23 2108  PHART 7.339* 7.357  HCO3 19.8* 18.1*    Studies/Results: DG CHEST PORT 1 VIEW Result Date: 06/22/2023 CLINICAL DATA:  Hypoxia. EXAM: PORTABLE CHEST 1 VIEW COMPARISON:  06/20/2023 FINDINGS: Interval extubation. Enteric tube in right internal jugular  sheath persist. Stable heart size and mediastinal contours. Worsening heterogeneous interstitial opacities, potential pulmonary edema. Small bilateral pleural effusions have also increased. No pneumothorax. IMPRESSION: 1. Interval extubation. 2. Worsening heterogeneous interstitial opacities, potential pulmonary edema. 3. Increased small bilateral pleural effusions. Electronically Signed   By: Narda Rutherford M.D.   On: 06/22/2023 22:49    Anti-infectives: Anti-infectives (From admission, onward)    Start     Dose/Rate Route Frequency Ordered Stop   06/20/23 1000  piperacillin-tazobactam (ZOSYN) IVPB 3.375 g        3.375 g 12.5 mL/hr over 240 Minutes Intravenous Every 8 hours 06/20/23 4098         Assessment/Plan: POD 2 s/p dx laparoscopy by Dr. 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 Karin Lieu on 06/20/23 - Intra-op noted to have some murky fluid in the pelvis. All of the small bowel was viable without evidence of ischemia or other abnormality. He did appear to have some venous congestion in the mesentery. The right colon, transverse colon, left colon, and rectosigmoid all appeared viable. The liver was viable. Case was handed  over to vascular, Dr. Karin Lieu, who performed Stenting of the superior mesenteric artery   - start TF 20cc/h - agree with TNA per CCM and we discussed on the unit   FEN - above VTE - SCDs, on hold or melena. Cont Plavix, ASA per vascular ID - Zosyn Foley - in place, strict I/O, per primary    - Per primary -  VDRF - extubated 4/8, BiPAP AKI  Acute on chronic anemia Hx CAD status post PCI Hx COPD Hx chronic HFrEF Hx AAA s/p repair Hx PAD Hx carotid artery stenosis s/p carotid endarterectomy    I spoke with his wife at the bedside   LOS: 3 days    Violeta Gelinas, MD, MPH, FACS Trauma & General Surgery Use [email or web address] to contact on call provider  06/23/2023

## 2023-06-23 NOTE — Progress Notes (Signed)
 Pharmacy Antibiotic Note  Erik Delph. is a 83 y.o. male admitted on 06/19/2023 with abdominal pain. Leukocytosis 4/7. Pharmacy has been consulted for Zosyn dosing for intra-abdominal infection.  Per discussion with CCM team, patient may have pneumonia on chest Xray this AM.  Scr up to 2.02 this AM. CrCl remains just above 20 mL/min. If CrCl continues to decline, will adjust Zosyn dosing.   Plan: -Continue Zosyn 3.375 g IV q8h extended infusion -Follow clinical progression, renal function, length of therapy.   Height: 5\' 6"  (167.6 cm) Weight: 51.5 kg (113 lb 8.6 oz) IBW/kg (Calculated) : 63.8  Temp (24hrs), Avg:98.3 F (36.8 C), Min:97.8 F (36.6 C), Max:99 F (37.2 C)  Recent Labs  Lab 06/20/23 0726 06/20/23 1025 06/20/23 1306 06/20/23 1755 06/20/23 1846 06/20/23 2022 06/21/23 0427 06/22/23 0424 06/22/23 1700 06/23/23 0500  WBC 20.2*  --   --   --  10.8*  --  3.3* 6.0  --  6.5  CREATININE 1.27*  --   --   --  1.18  --  1.30* 1.82* 1.74*  1.75* 2.02*  LATICACIDVEN 6.2* 4.8* 7.4* 5.2*  --  2.3* 1.4  --   --   --     Estimated Creatinine Clearance: 20.5 mL/min (A) (by C-G formula based on SCr of 2.02 mg/dL (H)).    Allergies  Allergen Reactions   Codeine Anaphylaxis and Swelling    throat swells    Antimicrobials this admission: Zosyn 4/7 >>  Microbiology results: NA  Thank you for allowing pharmacy to be a part of this patient's care.  Cedric Fishman, PharmD, BCPS Clinical Pharmacist 06/23/2023 10:16 AM

## 2023-06-24 ENCOUNTER — Inpatient Hospital Stay (HOSPITAL_COMMUNITY)

## 2023-06-24 ENCOUNTER — Other Ambulatory Visit: Payer: Self-pay

## 2023-06-24 DIAGNOSIS — N179 Acute kidney failure, unspecified: Secondary | ICD-10-CM

## 2023-06-24 DIAGNOSIS — J9601 Acute respiratory failure with hypoxia: Secondary | ICD-10-CM

## 2023-06-24 DIAGNOSIS — R0602 Shortness of breath: Secondary | ICD-10-CM

## 2023-06-24 DIAGNOSIS — R100 Acute abdomen: Secondary | ICD-10-CM

## 2023-06-24 DIAGNOSIS — E43 Unspecified severe protein-calorie malnutrition: Secondary | ICD-10-CM | POA: Diagnosis not present

## 2023-06-24 DIAGNOSIS — J690 Pneumonitis due to inhalation of food and vomit: Secondary | ICD-10-CM | POA: Diagnosis not present

## 2023-06-24 LAB — ECHOCARDIOGRAM COMPLETE
AR max vel: 1.56 cm2
AV Area VTI: 1.51 cm2
AV Area mean vel: 1.47 cm2
AV Mean grad: 10 mmHg
AV Peak grad: 18.3 mmHg
Ao pk vel: 2.14 m/s
Area-P 1/2: 3.03 cm2
Height: 66 in
MV VTI: 1.37 cm2
S' Lateral: 4.1 cm
Weight: 1820.12 [oz_av]

## 2023-06-24 LAB — GLUCOSE, CAPILLARY
Glucose-Capillary: 118 mg/dL — ABNORMAL HIGH (ref 70–99)
Glucose-Capillary: 130 mg/dL — ABNORMAL HIGH (ref 70–99)
Glucose-Capillary: 146 mg/dL — ABNORMAL HIGH (ref 70–99)
Glucose-Capillary: 147 mg/dL — ABNORMAL HIGH (ref 70–99)

## 2023-06-24 LAB — BASIC METABOLIC PANEL WITH GFR
Anion gap: 11 (ref 5–15)
BUN: 97 mg/dL — ABNORMAL HIGH (ref 8–23)
CO2: 22 mmol/L (ref 22–32)
Calcium: 8.4 mg/dL — ABNORMAL LOW (ref 8.9–10.3)
Chloride: 121 mmol/L — ABNORMAL HIGH (ref 98–111)
Creatinine, Ser: 2.17 mg/dL — ABNORMAL HIGH (ref 0.61–1.24)
GFR, Estimated: 30 mL/min — ABNORMAL LOW (ref >60)
Glucose, Bld: 179 mg/dL — ABNORMAL HIGH (ref 70–99)
Potassium: 3.1 mmol/L — ABNORMAL LOW (ref 3.5–5.1)
Sodium: 154 mmol/L — ABNORMAL HIGH (ref 135–145)

## 2023-06-24 LAB — HEMOGLOBIN A1C
Hgb A1c MFr Bld: 5.6 % (ref 4.8–5.6)
Mean Plasma Glucose: 114 mg/dL

## 2023-06-24 LAB — MAGNESIUM
Magnesium: 2.3 mg/dL (ref 1.7–2.4)
Magnesium: 2.1 mg/dL (ref 1.7–2.4)

## 2023-06-24 LAB — PHOSPHORUS
Phosphorus: 2.8 mg/dL (ref 2.5–4.6)
Phosphorus: 1.1 mg/dL — ABNORMAL LOW (ref 2.5–4.6)

## 2023-06-24 MED ORDER — POTASSIUM CHLORIDE 20 MEQ PO PACK
40.0000 meq | PACK | Freq: Once | ORAL | Status: AC
  Administered 2023-06-24: 40 meq
  Filled 2023-06-24: qty 2

## 2023-06-24 MED ORDER — TRAVASOL 10 % IV SOLN
INTRAVENOUS | Status: DC
Start: 2023-06-24 — End: 2023-06-24

## 2023-06-24 MED ORDER — TRAVASOL 10 % IV SOLN
INTRAVENOUS | Status: AC
  Filled 2023-06-24: qty 806.4

## 2023-06-24 MED ORDER — PIPERACILLIN-TAZOBACTAM 3.375 G IVPB
3.3750 g | Freq: Two times a day (BID) | INTRAVENOUS | Status: AC
  Administered 2023-06-24 – 2023-06-26 (×5): 3.375 g via INTRAVENOUS
  Filled 2023-06-24 (×5): qty 50

## 2023-06-24 MED ORDER — POTASSIUM CHLORIDE 10 MEQ/50ML IV SOLN
10.0000 meq | INTRAVENOUS | Status: AC
Start: 2023-06-24 — End: 2023-06-24
  Administered 2023-06-24 (×2): 10 meq via INTRAVENOUS
  Filled 2023-06-24 (×2): qty 50

## 2023-06-24 MED ORDER — PROSOURCE TF20 ENFIT COMPATIBL EN LIQD
60.0000 mL | Freq: Every day | ENTERAL | Status: DC
  Administered 2023-06-25 – 2023-06-28 (×4): 60 mL
  Filled 2023-06-24 (×4): qty 60

## 2023-06-24 MED ORDER — FREE WATER
200.0000 mL | Status: DC
  Administered 2023-06-24 – 2023-06-26 (×12): 200 mL

## 2023-06-24 MED ORDER — METOCLOPRAMIDE HCL 5 MG/ML IJ SOLN: 10.0000 mg | Freq: Once | INTRAMUSCULAR | Status: DC

## 2023-06-24 MED ORDER — SODIUM CHLORIDE 0.9% FLUSH: 10.0000 mL | INTRAVENOUS | Status: DC | PRN

## 2023-06-24 MED ORDER — SODIUM CHLORIDE 0.9% FLUSH
10.0000 mL | Freq: Two times a day (BID) | INTRAVENOUS | Status: DC
  Administered 2023-06-24: 20 mL
  Administered 2023-06-25: 30 mL
  Administered 2023-06-26 – 2023-06-27 (×4): 10 mL

## 2023-06-24 MED ORDER — VITAL 1.5 CAL PO LIQD
1000.0000 mL | ORAL | Status: DC
  Administered 2023-06-26 – 2023-06-27 (×2): 1000 mL
  Filled 2023-06-24: qty 1000

## 2023-06-24 NOTE — Progress Notes (Addendum)
 [facility]   Subjective  - non responsive verbally, reacts to painful stimulus.     Objective (!) 111/53 76 98.5 F (36.9 C) (Axillary) 19 100%  Intake/Output Summary (Last 24 hours) at 06/24/2023 0751 Last data filed at 06/24/2023 0600 Gross per 24 hour  Intake 989.28 ml  Output 1200 ml  Net -210.72 ml   Lungs supported non labored breathing Moves all 4 extremities in painful withdrawal response to palpation of abdomin.  Abdomin tight with mild distension, painful in all 4 quadrants. Right groin without hematoma Palpable DP right LE Rectal tube small diarrhea OP Does not respond verbally   Assessment/Planning: 83 y.o. male is s/p SMA stenting 4 Days Post-Op   Right LE without issues, groin soft, palatable pedal pulse Abdominal distention with painful withdrawal to palpation No leukocytosis, currently managed on IV Zosyn  Cr without change 2.17 Urine OP good> 1100 ml last 24 hours Minimal BM watery, loose Cont Plavix, ASA and SQ heparin for DVT prophylaxis   Erik [name] 06/24/2023 7:51 AM --  VASCULAR STAFF ADDENDUM: I have independently interviewed and examined the patient. I agree with the above.  Bipap, less belly pain than previous  Labs unremarkable.  Appreciate CCM and general surgery.  Please continue DAPT  Erik [name] [facility] Phone Number: [phone] 06/24/2023 11:24 AM    Laboratory Lab Results: Recent Labs    06/22/23 0424 06/22/23 1842 06/23/23 0500 06/23/23 0859  WBC 6.0  --  6.5  --   HGB 10.9*   < > 12.3* 11.9*  HCT 33.3*   < > 37.3* 35.0*  PLT 163  --  153  --    < > = values in this interval not displayed.   BMET Recent Labs    06/23/23 0500 06/23/23 0859 06/24/23 0500  NA 149* 150* 154*  K 3.8 3.3* 3.1*  CL 115*  --  121*  CO2 20*  --  22  GLUCOSE 94  --  179*  BUN 73*  --  97*  CREATININE 2.02*  --  2.17*  CALCIUM 8.5*  --   8.4*    COAG Lab Results  Component Value Date   INR 1.1 02/21/2023   INR 1.3 (H) 02/15/2022   INR 1.3 (H) 02/03/2022   No results found for: "PTT"

## 2023-06-24 NOTE — Progress Notes (Signed)
 Brief Nutrition Support Note  Pt had TPN initiated. Did have a drop in potassium, magnesium, and phosphorus. Potassium now low but being replaced. Current bag of TPN did not contain magnesium or phosphorus but tonight's bag does, will continue to monitor to determine if additional replacement is needed outside of bag.   Cortrak tube placed today and tip terminates in the proximal jejunum per imaging. Discussed with MD, will add free water today and requests that trickle TF be initiated in the AM. TPN to be continued.   Recommend the following: TPN - management per pharmacy Advance cautiously as pt is at risk for refeeding.  Would not recommend weaning TPN until pt is tolerating enteral feeds and advanced to the point where he is meeting 75% of his needs  Initiate tube feeding via postpyloric cortrak 4/12. Hold at trickle of 68mL/h and do not advance until cleared by CCM and surgery. Goal is as follows: Vital 1.5 at 45 ml/h (1080 ml per day) Prosource TF20 60 ml 1x/d Provides 1700 kcal, 93 gm protein, 825 ml free water daily    Greig Castilla, RD, LDN Registered Dietitian II Please reach out via secure chat

## 2023-06-24 NOTE — Progress Notes (Signed)
 Pharmacy Antibiotic Note  Erik [name]. is a 83 y.o. male admitted on 06/19/2023 with abdominal pain. Leukocytosis 4/7. Pharmacy has been consulted for Zosyn dosing for intra-abdominal infection.  Per discussion with CCM team, patient may have pneumonia on chest Xray.  Scr up to 2.17 this AM. CrCl remains is now below 20 mL/min will adjust Zosyn dosing.   Plan: -Reduce Zosyn 3.375 g IV to q12h extended infusion -Follow clinical progression, renal function, length of therapy.   Height: 5\' 6"  (167.6 cm) Weight: 51.6 kg (113 lb 12.1 oz) IBW/kg (Calculated) : 63.8  Temp (24hrs), Avg:97.6 F (36.4 C), Min:96.7 F (35.9 C), Max:98.5 F (36.9 C)  Recent Labs  Lab 06/20/23 0726 06/20/23 1025 06/20/23 1306 06/20/23 1755 06/20/23 1846 06/20/23 2022 06/21/23 0427 06/22/23 0424 06/22/23 1700 06/23/23 0500 06/24/23 0500  WBC 20.2*  --   --   --  10.8*  --  3.3* 6.0  --  6.5  --   CREATININE 1.27*  --   --   --  1.18  --  1.30* 1.82* 1.74*  1.75* 2.02* 2.17*  LATICACIDVEN 6.2* 4.8* 7.4* 5.2*  --  2.3* 1.4  --   --   --   --     Estimated Creatinine Clearance: 19.2 mL/min (A) (by C-G formula based on SCr of 2.17 mg/dL (H)).    Allergies  Allergen Reactions   Codeine Anaphylaxis and Swelling    throat swells    Antimicrobials this admission: Zosyn 4/7 >>  Microbiology results: NA  Thank you for allowing pharmacy to be a part of this patient's care.  Cedric Fishman, PharmD, BCPS Clinical Pharmacist 06/24/2023 10:13 AM

## 2023-06-24 NOTE — Plan of Care (Signed)
  Problem: Health Behavior/Discharge Planning: Goal: Ability to manage health-related needs will improve Outcome: Not Progressing   Problem: Clinical Measurements: Goal: Ability to maintain clinical measurements within normal limits will improve Outcome: Progressing Goal: Will remain free from infection Outcome: Progressing Goal: Diagnostic test results will improve Outcome: Progressing Goal: Respiratory complications will improve Outcome: Progressing Goal: Cardiovascular complication will be avoided Outcome: Progressing   Problem: Activity: Goal: Risk for activity intolerance will decrease Outcome: Progressing   Problem: Nutrition: Goal: Adequate nutrition will be maintained Outcome: Progressing   Problem: Coping: Goal: Level of anxiety will decrease Outcome: Progressing   Problem: Elimination: Goal: Will not experience complications related to bowel motility Outcome: Progressing Goal: Will not experience complications related to urinary retention Outcome: Progressing   Problem: Pain Managment: Goal: General experience of comfort will improve and/or be controlled Outcome: Progressing   Problem: Safety: Goal: Ability to remain free from injury will improve Outcome: Progressing   Problem: Skin Integrity: Goal: Risk for impaired skin integrity will decrease Outcome: Progressing   Problem: Education: Goal: Understanding of CV disease, CV risk reduction, and recovery process will improve Outcome: Progressing Goal: Individualized Educational Video(s) Outcome: Progressing   Problem: Activity: Goal: Ability to return to baseline activity level will improve Outcome: Progressing   Problem: Cardiovascular: Goal: Ability to achieve and maintain adequate cardiovascular perfusion will improve Outcome: Progressing Goal: Vascular access site(s) Level 0-1 will be maintained Outcome: Progressing

## 2023-06-24 NOTE — Procedures (Signed)
 Cortrak  Tube Type:  Cortrak - 55 inches Tube Location:  Right nare Secured by: Bridle Technique Used to Measure Tube Placement:  Marking at nare/corner of mouth Cortrak Secured At:  100 cm   Cortrak Tube Team Note:  Consult received to place a Cortrak feeding tube.   No x-ray is required. RN may begin using tube.   If the tube becomes dislodged please keep the tube and contact the Cortrak team at [email or web address] for replacement.  If after hours and replacement cannot be delayed, place a NG tube and confirm placement with an abdominal x-ray.    Betsey Holiday MS, RD, LDN If unable to be reached, please send secure chat to "RD inpatient" available from 8:00a-4:00p daily

## 2023-06-24 NOTE — Progress Notes (Signed)
 Patient removed from bipap at approximately 1041 in preparation for Cortrak placement. MD aware and okay with leaving bipap off if no longer needed.

## 2023-06-24 NOTE — Progress Notes (Signed)
 Patient ID: Erik Lope., male   DOB: Jan 10, 1941, 83 y.o.   MRN: [id number] 4 Days Post-Op    Subjective: On BiPAP ROS negative except as listed above. Objective: Vital signs in last 24 hours: Temp:  [97 F (36.1 C)-98.5 F (36.9 C)] 98.5 F (36.9 C) (04/11 0345) Pulse Rate:  [56-89] 75 (04/11 0801) Resp:  [18-25] 18 (04/11 0800) BP: (103-142)/(50-67) 103/50 (04/11 0800) SpO2:  [95 %-100 %] 100 % (04/11 0801) Arterial Line BP: (105-163)/(34-51) 146/42 (04/11 0800) FiO2 (%):  [40 %-60 %] 40 % (04/11 0806) Weight:  [51.6 kg] 51.6 kg (04/11 0500) Last BM Date : 06/23/23  Intake/Output from previous day: 04/10 0701 - 04/11 0700 In: 1046.8 [I.V.:898.3; IV Piggyback:148.5] Out: 1200 [Urine:1150; Stool:50] Intake/Output this shift: Total I/O In: 58.9 [I.V.:46.9; IV Piggyback:12.1] Out: -   General appearance: delirious GI: soft, incisions small ecchymoses, not sig tander  Lab Results: CBC  Recent Labs    06/22/23 0424 06/22/23 1842 06/23/23 0500 06/23/23 0859  WBC 6.0  --  6.5  --   HGB 10.9*   < > 12.3* 11.9*  HCT 33.3*   < > 37.3* 35.0*  PLT 163  --  153  --    < > = values in this interval not displayed.   BMET Recent Labs    06/23/23 0500 06/23/23 0859 06/24/23 0500  NA 149* 150* 154*  K 3.8 3.3* 3.1*  CL 115*  --  121*  CO2 20*  --  22  GLUCOSE 94  --  179*  BUN 73*  --  97*  CREATININE 2.02*  --  2.17*  CALCIUM 8.5*  --  8.4*   PT/INR No results for input(s): "LABPROT", "INR" in the last 72 hours. ABG Recent Labs    06/22/23 2108 06/23/23 0859  PHART 7.357 7.391  HCO3 18.1* 21.0    Studies/Results: DG CHEST PORT 1 VIEW Result Date: 06/22/2023 CLINICAL DATA:  Hypoxia. EXAM: PORTABLE CHEST 1 VIEW COMPARISON:  06/20/2023 FINDINGS: Interval extubation. Enteric tube in right internal jugular sheath persist. Stable heart size and mediastinal contours. Worsening heterogeneous interstitial opacities, potential pulmonary edema. Small bilateral  pleural effusions have also increased. No pneumothorax. IMPRESSION: 1. Interval extubation. 2. Worsening heterogeneous interstitial opacities, potential pulmonary edema. 3. Increased small bilateral pleural effusions. Electronically Signed   By: Narda Rutherford M.D.   On: 06/22/2023 22:49    Anti-infectives: Anti-infectives (From admission, onward)    Start     Dose/Rate Route Frequency Ordered Stop   06/20/23 1000  piperacillin-tazobactam (ZOSYN) IVPB 3.375 g        3.375 g 12.5 mL/hr over 240 Minutes Intravenous Every 8 hours 06/20/23 1478         Assessment/Plan: POD 2 s/p dx laparoscopy by Dr. 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 Karin Lieu on 06/20/23 - Intra-op noted to have some murky fluid in the pelvis. All of the small bowel was viable without evidence of ischemia or other abnormality. He did appear to have some venous congestion in the mesentery. The right colon, transverse colon, left colon, and rectosigmoid all appeared viable. The liver was viable. Case was handed over to vascular, Dr. Karin Lieu, who performed Stenting of the superior mesenteric artery   - start TF 20cc/h when OK per CCM in light of respiratory failure - agree with TNA per CCM    FEN - above VTE - SCDs, on hold or melena. Cont Plavix, ASA per vascular ID -  Zosyn Foley - in place, strict I/O, per primary    - Per primary -  VDRF - extubated 4/8, BiPAP AKI  Acute on chronic anemia Hx CAD status post PCI Hx COPD Hx chronic HFrEF Hx AAA s/p repair Hx PAD Hx carotid artery stenosis s/p carotid endarterectomy      LOS: 4 days    Violeta Gelinas, MD, MPH, FACS Trauma & General Surgery Use [email or web address] to contact on call provider  06/24/2023

## 2023-06-24 NOTE — Progress Notes (Signed)
 NAME:  Erik Fana., MRN:  [id number], DOB:  July 02, 1940, LOS: 4 ADMISSION DATE:  06/19/2023, CONSULTATION DATE:  06/20/2023 REFERRING MD:   Dr. Toniann Fail  CHIEF COMPLAINT:  Abd pain    History of Present Illness:  83 year old male patient with a known history of severe coronary artery disease with prior coronary artery stenting as well as AAA repair complicated further by heart failure with reduced EF COPD and tobacco abuse presented to the emergency room at [facility] on 4/6 with 2-day history of progressive abdominal pain which had been ongoing and intermittent over 2 to 3 months but acutely worse.  In the ER CT angiogram was obtained showed partial bowel obstruction with moderate to severe focal stenosis of the SMA, brought to the emergency room at [facility] and on 4/7 underwent exploratory laparotomy intraoperatively bowel was viable, without evidence of ischemia, He then underwent insertion of stent to superior mesenteric artery by vascular surgery.  Returns to the intensive care on mechanical ventilator critical care asked to evaluate.  Pertinent  Medical History  CAD, status post PCI Chronic HF  AAA s/p repair COPD  Severe PAD Tobacco use   Significant Hospital Events: Including procedures, antibiotic start and stop dates in addition to other pertinent events   4/7  s/p dx laparoscopy by Dr. Janee Morn and stenting of the superior mesenteric artery  4/8 Extubated  4/9 Stable  4/10 Placed on BiPAP   Interim History / Subjective:  Weaned off Precedex this am  Objective   Blood pressure (!) 103/50, pulse 75, temperature (!) 96.7 F (35.9 C), temperature source Axillary, resp. rate 18, height 5\' 6"  (1.676 m), weight 51.6 kg, SpO2 100%. CVP:  [3 mmHg-6 mmHg] 5 mmHg  FiO2 (%):  [40 %-60 %] 40 % PEEP:  [5 cmH20] 5 cmH20   Intake/Output Summary (Last 24 hours) at 06/24/2023 1004 Last data filed at 06/24/2023 0900 Gross per 24 hour  Intake 1092.9 ml  Output 750 ml  Net 342.9 ml    Filed Weights   06/19/23 1836 06/23/23 0426 06/24/23 0500  Weight: 50.8 kg 51.5 kg 51.6 kg    Physical Exam: General: Chronically ill and elderly-appearing, agitated, delirious HENT: Helix, AT, BiPAP in place Eyes: EOMI, no scleral icterus Respiratory: Diminished to auscultation bilaterally.  No crackles, wheezing or rales Cardiovascular: RRR, -M/R/G, no JVD GI: BS+, soft, diffuse tenderness Extremities:-Edema,-tenderness Neuro: Eyes closed, encephalopathic, moves extremities x 4, CNII-XII grossly intact GU: Foley in place   Resolved Hospital Problem list   Ventilator management with underlying COPD chronic tobacco use Ischemic bowel lactic acidosis status post stent placement in the SMA  Assessment & Plan:   Acute hypoxemic respiratory failure 2/2 aspiration pneumonia COPD - On BiPAP, wean off as tolerated  - Continue nebs - Continue Zosyn 4/7> - Echo ordered  Agitation requiring sedation  ICU delirium - Weaning precedex - Seroquel  Hypernatremia Severe protein malnutrition - Continue TPN. Place post-pyloric cortrak today for potential feeds over the weekend - Start FWF  AKI - DC LR yesterday once TPN started - Monitor UOP/Cr - Avoid nephrotoxic agents - FWF as above  CAD s/p PCI, hx carotid artery stenosis s/p endarterectomy Hx AAA s/p repair SMA stent 4/7 PVD - Vascular following - ASA, Plavix  Abdominal pain s/p ex-lap 4/7 - No evidence of ischemic bowel but murky fluid in pelvis Elevated LFTs - PRN tylenol and low dose dilaudid  - Trend  Hx CVA - Plavix, ASA  Hypokalemia - Replete  BPH - Foley  Best Practice (right click and "Reselect all SmartList Selections" daily)   Diet/type: NPO and TPN DVT prophylaxis: prophylactic heparin  GI prophylaxis: PPI Lines: Central line and Arterial Line Foley:  Yes, and it is still needed Code Status:  full code Last date of multidisciplinary goals of care discussion [06/24/23] Confirmed full code with  wife  Critical care time: 34 Minutes   The patient is critically ill with multiple organ systems failure and requires high complexity decision making for assessment and support, frequent evaluation and titration of therapies, application of advanced monitoring technologies and extensive interpretation of multiple databases.  Independent Critical Care Time: 45 Minutes.   Mechele Collin, M.D. [facility] Pulmonary/Critical Care Medicine 06/24/2023 10:04 AM   Please see Amion for pager number to reach on-call Pulmonary and Critical Care Team.

## 2023-06-24 NOTE — Progress Notes (Signed)
 [facility] Liaison Note  06/24/2023  [name] 07-19-1940 [id number]  Location: RN Hospital Liaison screened the patient remotely at [facility].  Insurance: Medicare   Erik [name]. is a 83 y.o. male who is a Primary Care Patient of Excell Seltzer, MD [facility] at [facility]. The patient was screened for readmission hospitalization with noted high risk score for unplanned readmission risk with 1 IP in 6 months.  The patient was assessed for potential Care Management service needs for post hospital transition for care coordination. Review of patient's electronic medical record reveals patient was admitted with Abdominal Pain. No anticipated needs for VBCI to address at this time.   Plan: [facility] Hospital Liaison will continue to follow progress and disposition to asess for post hospital community care coordination/management needs.  Referral request for community care coordination: anticipate Transitions of Care Team follow up.   VBCI Care Management/Population Health does not replace or interfere with any arrangements made by the Inpatient Transition of Care team.   For questions contact:   Elliot Cousin, RN, BSN Hospital Liaison [place]   [facility], Population Health Office Hours MTWF  8:00 am-6:00 pm Direct Dial: [phone] [email or web address]

## 2023-06-24 NOTE — Progress Notes (Addendum)
 PHARMACY - TOTAL PARENTERAL NUTRITION CONSULT NOTE   Indication:  malnutrition, suspected ileus  Patient Measurements: Height: 5\' 6"  (167.6 cm) Weight: 51.6 kg (113 lb 12.1 oz) IBW/kg (Calculated) : 63.8 TPN AdjBW (KG): 50.8 Body mass index is 18.36 kg/m.  Assessment:  83yoM admitted 4/7 for complaints of acute on chronic abdominal pain (2 - 3 month history, worsened for 2 days prior to admission). Initial CT abdomen/pelvis showed concern for early SBO vs ileus as well as stenosis of celiac artery and SMA, mesenteric stent placed. Underwent diagnostic laparoscopy and mesenteric angiography on 4/7, no bowel ischemia appreciated. All of small bowel viable per surgery note.  Has had weight loss over past several years from 132 lbs to 112 lbs despite normal dietary intake, now has muscle/fat depletion with concern for severe malnutrition on RD exam. Has not received enteral nutrition since admission on 4/7, NG tube to suction. Per vascular surgery team, abdominal pain may be 2/2 edematous bowel so holding tube feeding. Pharmacy consulted for TPN 4/9.  Glucose / Insulin: last A1c 5.6, CBGs 142 - 179 since initiation of TPN, 1u SSI 4/7 Dexamethasone 10 mg x1 4/9 D5LR @ 100 x 6 hours with PM labs to assess for refeeding - in total received 33g  Electrolytes: K 3.1 (prev 3.8, received lasix 40 IV followed by 40 mEq K PO), Na 154 (from 150), Cl up 121, CO2 22 (from 18-20), iCa 1.26, Phos 2.8 (from 4), Mg 2.3 Possible refeeding: Phos 3.9 right before TPN initiation, dropped to 2.8; K decr as well Renal: Scr up 2.02 (baseline <1), BUN up 73 (22 on admission) Hepatic: AST 265, ALT 334 (both up from normal limits), Albumin 2.0, Tbili 1.1  Intake / Output; MIVF: 0.9 mL/kg/h UOP, 50 mL stool, dark tarry BM 4/8, FOBT+; LBM 4/9 (small) x1 Thiamine x7 days per RD (4/10 - 4/16)  GI Imaging: 4/6 CT: multiple fluid filled mildly dilated loops of small bowel 4/7 KUB: decreased gaseous distension of small  bowel loops, retained stool in R colon 4/7 KUB SBO protocol: contrast within colonic lumen GI Surgeries / Procedures:  4/7 Diagnostic laparoscopy and mesenteric angiography  Central access: R internal jugular 4/7 TPN start date: 4/10  Nutritional Goals: Goal TPN rate is 60 mL/hr (provides 81 g of protein and 1,527 kcals per day)  RD Assessment: Estimated Needs Total Energy Estimated Needs: 1500-1800 kcal/d Total Protein Estimated Needs: 80-95g/d Total Fluid Estimated Needs: 1.8L/d  Current Nutrition:  Tube feeding (4/11) + TPN  Plan:  Advance TPN to goal at 60 mL/h at 1800 Electrolytes in TPN: remove Na 4/11, increase to K 33 mEq/L (total 47.5 mEq), decrease Ca 4 mEq/L (total ~5.8 mEq), add Mg 5 mEq/L, add Phos 10 mmol/L (total 14 mmol). Max acetate If not refeeding 4/12, consider removing Mg Supplement KCl 10 mEq IV x2 outside of TPN Primary team supplementing Kcl 40 mEq per tube Add standard MVI and trace elements to TPN Continue sensitive q6h SSI and adjust as needed  F/u TF tolerance, ability to wean TPN Monitor TPN labs on Mon/Thurs, PRN  Rutherford Nail, PharmD PGY2 Critical Care Pharmacy Resident 06/24/2023,8:54 AM

## 2023-06-25 DIAGNOSIS — E43 Unspecified severe protein-calorie malnutrition: Secondary | ICD-10-CM | POA: Diagnosis not present

## 2023-06-25 DIAGNOSIS — N179 Acute kidney failure, unspecified: Secondary | ICD-10-CM | POA: Diagnosis not present

## 2023-06-25 DIAGNOSIS — D696 Thrombocytopenia, unspecified: Secondary | ICD-10-CM

## 2023-06-25 DIAGNOSIS — J9601 Acute respiratory failure with hypoxia: Secondary | ICD-10-CM | POA: Diagnosis not present

## 2023-06-25 DIAGNOSIS — J690 Pneumonitis due to inhalation of food and vomit: Secondary | ICD-10-CM | POA: Diagnosis not present

## 2023-06-25 LAB — GLUCOSE, CAPILLARY
Glucose-Capillary: 120 mg/dL — ABNORMAL HIGH (ref 70–99)
Glucose-Capillary: 127 mg/dL — ABNORMAL HIGH (ref 70–99)
Glucose-Capillary: 128 mg/dL — ABNORMAL HIGH (ref 70–99)
Glucose-Capillary: 132 mg/dL — ABNORMAL HIGH (ref 70–99)
Glucose-Capillary: 146 mg/dL — ABNORMAL HIGH (ref 70–99)

## 2023-06-25 LAB — BASIC METABOLIC PANEL WITH GFR
Anion gap: 10 (ref 5–15)
BUN: 85 mg/dL — ABNORMAL HIGH (ref 8–23)
CO2: 23 mmol/L (ref 22–32)
Calcium: 8.3 mg/dL — ABNORMAL LOW (ref 8.9–10.3)
Chloride: 123 mmol/L — ABNORMAL HIGH (ref 98–111)
Creatinine, Ser: 1.59 mg/dL — ABNORMAL HIGH (ref 0.61–1.24)
GFR, Estimated: 43 mL/min — ABNORMAL LOW (ref >60)
Glucose, Bld: 145 mg/dL — ABNORMAL HIGH (ref 70–99)
Potassium: 2.9 mmol/L — ABNORMAL LOW (ref 3.5–5.1)
Sodium: 156 mmol/L — ABNORMAL HIGH (ref 135–145)

## 2023-06-25 LAB — RENAL FUNCTION PANEL
Albumin: 1.5 g/dL — ABNORMAL LOW (ref 3.5–5.0)
Anion gap: 5 (ref 5–15)
BUN: 72 mg/dL — ABNORMAL HIGH (ref 8–23)
CO2: 25 mmol/L (ref 22–32)
Calcium: 7.9 mg/dL — ABNORMAL LOW (ref 8.9–10.3)
Chloride: 122 mmol/L — ABNORMAL HIGH (ref 98–111)
Creatinine, Ser: 1.28 mg/dL — ABNORMAL HIGH (ref 0.61–1.24)
GFR, Estimated: 56 mL/min — ABNORMAL LOW (ref >60)
Glucose, Bld: 138 mg/dL — ABNORMAL HIGH (ref 70–99)
Phosphorus: 3.6 mg/dL (ref 2.5–4.6)
Potassium: 3 mmol/L — ABNORMAL LOW (ref 3.5–5.1)
Sodium: 152 mmol/L — ABNORMAL HIGH (ref 135–145)

## 2023-06-25 LAB — CBC
HCT: 30.4 % — ABNORMAL LOW (ref 39.0–52.0)
HCT: 30.5 % — ABNORMAL LOW (ref 39.0–52.0)
Hemoglobin: 10 g/dL — ABNORMAL LOW (ref 13.0–17.0)
Hemoglobin: 10.1 g/dL — ABNORMAL LOW (ref 13.0–17.0)
MCH: 27.4 pg (ref 26.0–34.0)
MCH: 28.1 pg (ref 26.0–34.0)
MCHC: 32.9 g/dL (ref 30.0–36.0)
MCHC: 33.1 g/dL (ref 30.0–36.0)
MCV: 83.3 fL (ref 80.0–100.0)
MCV: 84.7 fL (ref 80.0–100.0)
Platelets: 72 10*3/uL — ABNORMAL LOW (ref 150–400)
Platelets: 62 10*3/uL — ABNORMAL LOW (ref 150–400)
RBC: 3.65 MIL/uL — ABNORMAL LOW (ref 4.22–5.81)
RBC: 3.6 MIL/uL — ABNORMAL LOW (ref 4.22–5.81)
RDW: 16.1 % — ABNORMAL HIGH (ref 11.5–15.5)
RDW: 16.4 % — ABNORMAL HIGH (ref 11.5–15.5)
WBC: 5.7 10*3/uL (ref 4.0–10.5)
WBC: 6.9 10*3/uL (ref 4.0–10.5)
nRBC: 0 % (ref 0.0–0.2)
nRBC: 0 % (ref 0.0–0.2)

## 2023-06-25 LAB — HEPATIC FUNCTION PANEL
ALT: 205 U/L — ABNORMAL HIGH (ref 0–44)
AST: 107 U/L — ABNORMAL HIGH (ref 15–41)
Albumin: 1.7 g/dL — ABNORMAL LOW (ref 3.5–5.0)
Alkaline Phosphatase: 35 U/L — ABNORMAL LOW (ref 38–126)
Bilirubin, Direct: 0.2 mg/dL (ref 0.0–0.2)
Indirect Bilirubin: 0.5 mg/dL (ref 0.3–0.9)
Total Bilirubin: 0.7 mg/dL (ref 0.0–1.2)
Total Protein: 5.1 g/dL — ABNORMAL LOW (ref 6.5–8.1)

## 2023-06-25 LAB — PHOSPHORUS: Phosphorus: 1.2 mg/dL — ABNORMAL LOW (ref 2.5–4.6)

## 2023-06-25 LAB — MAGNESIUM
Magnesium: 2.2 mg/dL (ref 1.7–2.4)
Magnesium: 2 mg/dL (ref 1.7–2.4)

## 2023-06-25 MED ORDER — POTASSIUM CHLORIDE 20 MEQ PO PACK
40.0000 meq | PACK | Freq: Once | ORAL | Status: AC
  Administered 2023-06-25: 40 meq
  Filled 2023-06-25: qty 2

## 2023-06-25 MED ORDER — SODIUM CHLORIDE 0.9% FLUSH: 3.0000 mL | INTRAVENOUS | Status: DC | PRN

## 2023-06-25 MED ORDER — DEXTROSE 5 % IV SOLN: INTRAVENOUS | Status: DC

## 2023-06-25 MED ORDER — POTASSIUM PHOSPHATES 15 MMOLE/5ML IV SOLN
45.0000 mmol | Freq: Once | INTRAVENOUS | Status: AC
  Administered 2023-06-25: 45 mmol via INTRAVENOUS
  Filled 2023-06-25: qty 15

## 2023-06-25 MED ORDER — ORAL CARE MOUTH RINSE: 15.0000 mL | OROMUCOSAL | Status: DC | PRN

## 2023-06-25 MED ORDER — SODIUM CHLORIDE 0.9% FLUSH
10.0000 mL | Freq: Two times a day (BID) | INTRAVENOUS | Status: DC
  Administered 2023-06-26 – 2023-06-27 (×3): 10 mL
  Administered 2023-06-27: 20 mL
  Administered 2023-06-28 – 2023-06-29 (×2): 10 mL
  Administered 2023-06-29: 20 mL

## 2023-06-25 MED ORDER — GERHARDT'S BUTT CREAM
TOPICAL_CREAM | CUTANEOUS | Status: DC | PRN
  Administered 2023-06-26: 1 via TOPICAL
  Filled 2023-06-25 (×3): qty 60

## 2023-06-25 MED ORDER — POTASSIUM CHLORIDE 20 MEQ PO PACK
20.0000 meq | PACK | Freq: Once | ORAL | Status: AC
  Administered 2023-06-25: 20 meq
  Filled 2023-06-25: qty 1

## 2023-06-25 MED ORDER — SODIUM CHLORIDE 0.9% FLUSH
3.0000 mL | Freq: Two times a day (BID) | INTRAVENOUS | Status: DC
  Administered 2023-06-25 – 2023-07-03 (×13): 3 mL via INTRAVENOUS

## 2023-06-25 MED ORDER — TRAVASOL 10 % IV SOLN
INTRAVENOUS | Status: AC
  Filled 2023-06-25: qty 806.4

## 2023-06-25 MED ORDER — SODIUM CHLORIDE 0.9 % IV SOLN: 250.0000 mL | INTRAVENOUS | Status: AC | PRN

## 2023-06-25 MED ORDER — SODIUM CHLORIDE 0.9% FLUSH: 10.0000 mL | INTRAVENOUS | Status: DC | PRN

## 2023-06-25 NOTE — Progress Notes (Signed)
 [facility]  Subjective  -awake, alert states he is in less pain than previous   Objective (!) 149/57 97 99.1 F (37.3 C) (Axillary) 20 94%  Intake/Output Summary (Last 24 hours) at 06/25/2023 9147 Last data filed at 06/25/2023 0800 Gross per 24 hour  Intake 2055.68 ml  Output 1900 ml  Net 155.68 ml   Off nonrebreather, nasal cannula Right groin without hematoma Palpable DP right LE Abdominal exam with mild pain  Assessment/Planning: 83 y.o. male is s/p SMA stenting 4 Days Post-Op   Recommend redrawing labs.  Unsure why sudden thrombocytopenia.  Consider HIT panel if platelets are confirmed low. Continue current medication regimen.  Will follow intermittently at this point. Stop Plavix if platelet count goes below 50.   Kayla Part 06/25/2023 9:29 AM    Laboratory Lab Results: Recent Labs    06/23/23 0500 06/23/23 0859 06/25/23 0430  WBC 6.5  --  5.7  HGB 12.3* 11.9* 10.0*  HCT 37.3* 35.0* 30.4*  PLT 153  --  72*   BMET Recent Labs    06/24/23 0500 06/25/23 0430  NA 154* 156*  K 3.1* 2.9*  CL 121* 123*  CO2 22 23  GLUCOSE 179* 145*  BUN 97* 85*  CREATININE 2.17* 1.59*  CALCIUM 8.4* 8.3*    COAG Lab Results  Component Value Date   INR 1.1 02/21/2023   INR 1.3 (H) 02/15/2022   INR 1.3 (H) 02/03/2022   No results found for: "PTT"

## 2023-06-25 NOTE — Progress Notes (Signed)
 PHARMACY - TOTAL PARENTERAL NUTRITION CONSULT NOTE   Indication:  malnutrition, suspected ileus  Patient Measurements: Height: 5\' 6"  (167.6 cm) Weight: 51.8 kg (114 lb 3.2 oz) IBW/kg (Calculated) : 63.8 TPN AdjBW (KG): 50.8 Body mass index is 18.43 kg/m.  Assessment:  83yoM admitted 4/7 for complaints of acute on chronic abdominal pain (2 - 3 month history, worsened for 2 days prior to admission). Initial CT abdomen/pelvis showed concern for early SBO vs ileus as well as stenosis of celiac artery and SMA, mesenteric stent placed. Underwent diagnostic laparoscopy and mesenteric angiography on 4/7, no bowel ischemia appreciated. All of small bowel viable per surgery note.  Has had weight loss over past several years from 132 lbs to 112 lbs despite normal dietary intake, now has muscle/fat depletion with concern for severe malnutrition on RD exam. Has not received enteral nutrition since admission on 4/7, NG tube to suction. Per vascular surgery team, abdominal pain may be 2/2 edematous bowel so holding tube feeding. Pharmacy consulted for TPN 4/9.  Glucose / Insulin: last A1c 5.6, CBGs stable in 140 range on goal TPN, 3u SSI 4/7 Dexamethasone 10 mg x1 4/9 D5LR @ 100 x 6 hours with PM labs to assess for refeeding - in total received 33g  Electrolytes: K dropped 2.9 (s/p 60 mEq outside TPN 4/1; receiving another 66 mEq in KPhos today), Na up 156 (FWF 200 q4; Na removed from TPN; D5W at 75 ml/hr added 4/12 x13 hrs), Cl up 123, CO2 23, CoCa ~10.1, Phos dropped 1.2 (supplementing 45 mmol KPhos this AM), Mg 2.2 Renal: Scr down 1.59 (baseline <1), BUN up 73 (22 on admission) Hepatic: LFTs bumped 4/10, now trending down, Albumin down 1.7, Tbili 0.7 Intake / Output; MIVF: 1.6 mL/kg/h UOP; dark tarry BM 4/8, FOBT+; LBM 4/12 (100 mL, type 7 brown; abd distended/taut/hypoactive/tender) - given Reglan 4/11 x1 Thiamine x7 days per RD (4/10 - 4/16)  GI Imaging: 4/6 CT: multiple fluid filled mildly dilated  loops of small bowel 4/7 KUB: decreased gaseous distension of small bowel loops, retained stool in R colon 4/7 KUB SBO protocol: contrast within colonic lumen GI Surgeries / Procedures:  4/7 Diagnostic laparoscopy and mesenteric angiography  Central access: R internal jugular 4/7 TPN start date: 4/10  Nutritional Goals: Goal TPN rate is 60 mL/hr (provides 81 g of protein and 1,527 kcals per day) Prosource 60 mL daily (started 4/12) Vital 1.5 @ 20 ml/hr (started 4/12)  RD Assessment: Estimated Needs Total Energy Estimated Needs: 1500-1800 kcal/d Total Protein Estimated Needs: 80-95g/d Total Fluid Estimated Needs: 1.8L/d  Current Nutrition:  Tube feeding (4/11) + TPN  Plan:  Continue TPN at goal 60 mL/h at 1800 Electrolytes in TPN: removed Na 4/11; increase to K 40 mEq/L (total 57 mEq), decrease Ca to 3 mEq/L (total ~4.3 mEq), Mg 5 mEq/L, increase Phos to 12 mmol/L (total 17 mmol). Max acetate If not refeeding 4/12, consider removing Mg Primary team supplementing KPhos 45 mmol  Repeat labs this PM Add standard MVI and trace elements to TPN Continue sensitive q6h SSI and adjust as needed  - if continue low/no usage, consider discontinuing  F/u TF tolerance, ability to wean TPN Monitor TPN labs on Mon/Thurs, next labs 4/12 PM Monitor enteral feed toleration for ability to advance and wean TPN.   Lenard Quam, PharmD, BCPS, BCCCP Please refer to [facility] for [facility] Pharm[facility] numbers 06/25/2023,7:06 AM

## 2023-06-25 NOTE — Progress Notes (Signed)
 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 Hold if Plt<50 per vascular.  D/w CCM MD Marciano Settles, PharmD, BCCCP Clinical Pharmacist 06/25/2023 2:30 PM

## 2023-06-25 NOTE — Progress Notes (Signed)
 5 Days Post-Op  Subjective: CC: Off BiPAP. Discussed w/ ccm/rn. More awake and alert today. Diffuse abdominal pain. No n/v. Having bm's. Afebrile. Not on pressors.   Objective: Vital signs in last 24 hours: Temp:  [98 F (36.7 C)-99.3 F (37.4 C)] 99.1 F (37.3 C) (04/12 0800) Pulse Rate:  [71-101] 97 (04/12 0900) Resp:  [13-28] 20 (04/12 0900) BP: (102-153)/(44-75) 149/57 (04/12 0900) SpO2:  [90 %-100 %] 94 % (04/12 0900) Arterial Line BP: (135-187)/(36-60) 166/49 (04/12 0430) Weight:  [51.8 kg] 51.8 kg (04/12 0454) Last BM Date : 06/25/23  Intake/Output from previous day: 04/11 0701 - 04/12 0700 In: 2050.2 [I.V.:1236.2; NG/GT:600; IV Piggyback:213.9] Out: 2100 [Urine:2000; Stool:100] Intake/Output this shift: Total I/O In: 124.4 [I.V.:60; IV Piggyback:64.4] Out: -   PE: Gen:  Alert, NAD, pleasant Abd: At least mild distension, gen ttp, + BS. Incisions w/ glue in place, cdi.   Lab Results:  Recent Labs    06/23/23 0500 06/23/23 0859 06/25/23 0430  WBC 6.5  --  5.7  HGB 12.3* 11.9* 10.0*  HCT 37.3* 35.0* 30.4*  PLT 153  --  72*   BMET Recent Labs    06/24/23 0500 06/25/23 0430  NA 154* 156*  K 3.1* 2.9*  CL 121* 123*  CO2 22 23  GLUCOSE 179* 145*  BUN 97* 85*  CREATININE 2.17* 1.59*  CALCIUM 8.4* 8.3*   PT/INR No results for input(s): "LABPROT", "INR" in the last 72 hours. CMP     Component Value Date/Time   NA 156 (H) 06/25/2023 0430   NA 138 08/18/2018 1135   K 2.9 (L) 06/25/2023 0430   CL 123 (H) 06/25/2023 0430   CO2 23 06/25/2023 0430   GLUCOSE 145 (H) 06/25/2023 0430   BUN 85 (H) 06/25/2023 0430   BUN 19 08/18/2018 1135   CREATININE 1.59 (H) 06/25/2023 0430   CREATININE 1.11 10/09/2021 1646   CALCIUM 8.3 (L) 06/25/2023 0430   PROT 5.1 (L) 06/25/2023 0430   ALBUMIN 1.7 (L) 06/25/2023 0430   AST 107 (H) 06/25/2023 0430   ALT 205 (H) 06/25/2023 0430   ALKPHOS 35 (L) 06/25/2023 0430   BILITOT 0.7 06/25/2023 0430   GFRNONAA 43 (L)  06/25/2023 0430   GFRAA 95 08/18/2018 1135   Lipase     Component Value Date/Time   LIPASE 61 (H) 03/09/2022 0812    Studies/Results: US  EKG SITE RITE Result Date: 06/24/2023 If Site Rite image not attached, placement could not be confirmed due to current cardiac rhythm.  DG CHEST PORT 1 VIEW Result Date: 06/24/2023 CLINICAL DATA:  Central line placement EXAM: PORTABLE CHEST 1 VIEW COMPARISON:  06/22/2023 FINDINGS: Right internal jugular vascular sheath remains in place, unchanged. No additional central line visualized. Feeding tube enters the stomach, the distal aspect of the tube not visualized on this chest x-ray. Heart and mediastinal contours within normal limits. Patchy bilateral airspace disease is similar prior study. No visible effusions or pneumothorax. No acute bony abnormality. IMPRESSION: Right internal jugular vascular sheath remains in place, unchanged. No pneumothorax. Patchy bilateral airspace disease concerning for pneumonia. No real change. Electronically Signed   By: Janeece Mechanic M.D.   On: 06/24/2023 20:12   ECHOCARDIOGRAM COMPLETE Result Date: 06/24/2023    ECHOCARDIOGRAM REPORT   Patient Name:   Erik [name]. Date of Exam: 06/24/2023 Medical Rec #:  [id number]             Height:       66.0 in  Accession #:    [id number]            Weight:       113.8 lb Date of Birth:  March 10, 1941              BSA:          1.573 m Patient Age:    83 years              BP:           104/69 mmHg Patient Gender: M                     HR:           83 bpm. Exam Location:  Inpatient Procedure: 2D Echo, Cardiac Doppler and Color Doppler (Both Spectral and Color            Flow Doppler were utilized during procedure). Indications:    Shortness of breath  History:        Patient has prior history of Echocardiogram examinations, most                 recent 02/03/2022. CHF, CAD, COPD; Risk Factors:Hypertension.  Sonographer:    Astrid Blamer Referring Phys: [id number] CHI JANE ELLISON IMPRESSIONS  1.  Left ventricular ejection fraction, by estimation, is 50 to 55%. The left ventricle has low normal function. The left ventricle has no regional wall motion abnormalities. There is mild left ventricular hypertrophy. Left ventricular diastolic parameters are indeterminate.  2. Right ventricular systolic function is normal. The right ventricular size is normal.  3. Left atrial size was moderately dilated.  4. Right atrial size was mildly dilated.  5. The mitral valve is degenerative. Trivial mitral valve regurgitation. Moderate mitral stenosis. The mean mitral valve gradient is 9.5 mmHg. Severe mitral annular calcification.  6. The aortic valve is tricuspid. There is severe calcifcation of the aortic valve. Aortic valve regurgitation is severe. Mild aortic valve stenosis. Aortic valve area, by VTI measures 1.51 cm. Aortic valve mean gradient measures 10.0 mmHg. Aortic valve Vmax measures 2.14 m/s.  7. The inferior vena cava is normal in size with greater than 50% respiratory variability, suggesting right atrial pressure of 3 mmHg. FINDINGS  Left Ventricle: Left ventricular ejection fraction, by estimation, is 50 to 55%. The left ventricle has low normal function. The left ventricle has no regional wall motion abnormalities. The left ventricular internal cavity size was normal in size. There is mild left ventricular hypertrophy. Abnormal (paradoxical) septal motion, consistent with left bundle branch block. Left ventricular diastolic parameters are indeterminate. Right Ventricle: The right ventricular size is normal. No increase in right ventricular wall thickness. Right ventricular systolic function is normal. Left Atrium: Left atrial size was moderately dilated. Right Atrium: Right atrial size was mildly dilated. Pericardium: There is no evidence of pericardial effusion. Mitral Valve: The mitral valve is degenerative in appearance. Severe mitral annular calcification. Trivial mitral valve regurgitation. Moderate  mitral valve stenosis. MV peak gradient, 17.6 mmHg. The mean mitral valve gradient is 9.5 mmHg. Tricuspid Valve: The tricuspid valve is normal in structure. Tricuspid valve regurgitation is trivial. No evidence of tricuspid stenosis. Aortic Valve: The aortic valve is tricuspid. There is severe calcifcation of the aortic valve. Aortic valve regurgitation is severe. Mild aortic stenosis is present. Aortic valve mean gradient measures 10.0 mmHg. Aortic valve peak gradient measures 18.3 mmHg. Aortic valve area, by VTI measures 1.51 cm. Pulmonic Valve: The pulmonic valve was normal in  structure. Pulmonic valve regurgitation is not visualized. No evidence of pulmonic stenosis. Aorta: The aortic root is normal in size and structure. Venous: The inferior vena cava was not well visualized. The inferior vena cava is normal in size with greater than 50% respiratory variability, suggesting right atrial pressure of 3 mmHg. IAS/Shunts: No atrial level shunt detected by color flow Doppler.  LEFT VENTRICLE PLAX 2D LVIDd:         4.40 cm   Diastology LVIDs:         4.10 cm   LV e' medial:    5.00 cm/s LV PW:         1.00 cm   LV E/e' medial:  24.6 LV IVS:        1.30 cm   LV e' lateral:   5.11 cm/s LVOT diam:     1.80 cm   LV E/e' lateral: 24.1 LV SV:         62 LV SV Index:   39 LVOT Area:     2.54 cm  RIGHT VENTRICLE RV S prime:     18.00 cm/s TAPSE (M-mode): 2.8 cm LEFT ATRIUM           Index        RIGHT ATRIUM           Index LA Vol (A4C): 35.5 ml 22.56 ml/m  RA Area:     13.40 cm                                    RA Volume:   29.90 ml  19.00 ml/m  AORTIC VALVE AV Area (Vmax):    1.56 cm AV Area (Vmean):   1.47 cm AV Area (VTI):     1.51 cm AV Vmax:           214.00 cm/s AV Vmean:          151.000 cm/s AV VTI:            0.408 m AV Peak Grad:      18.3 mmHg AV Mean Grad:      10.0 mmHg LVOT Vmax:         131.00 cm/s LVOT Vmean:        87.400 cm/s LVOT VTI:          0.242 m LVOT/AV VTI ratio: 0.59  AORTA Ao Root diam:  2.90 cm MITRAL VALVE                TRICUSPID VALVE MV Area (PHT): 3.03 cm     TR Peak grad:   35.8 mmHg MV Area VTI:   1.37 cm     TR Vmax:        299.00 cm/s MV Peak grad:  17.6 mmHg MV Mean grad:  9.5 mmHg     SHUNTS MV Vmax:       2.10 m/s     Systemic VTI:  0.24 m MV Vmean:      146.0 cm/s   Systemic Diam: 1.80 cm MV Decel Time: 250 msec MV E velocity: 123.00 cm/s MV A velocity: 172.00 cm/s MV E/A ratio:  0.72 Jules Oar MD Electronically signed by Jules Oar MD Signature Date/Time: 06/24/2023/1:08:14 PM    Final    DG Abd Portable 1V Result Date: 06/24/2023 CLINICAL DATA:  Feeding tube placement. EXAM: PORTABLE ABDOMEN - 1 VIEW COMPARISON:  06/20/2023 FINDINGS: New weighted enteric  tube with tip in the left upper quadrant in the region of the proximal jejunum. The previous non weighted enteric tube has been removed. Aorto bi-iliac stent graft in place. Persistent gaseous colonic distension. Prominent but not abnormally dilated small bowel. IMPRESSION: Weighted enteric tube with tip in the left upper quadrant in the region of the proximal jejunum. Electronically Signed   By: Chadwick Colonel M.D.   On: 06/24/2023 12:39    Anti-infectives: Anti-infectives (From admission, onward)    Start     Dose/Rate Route Frequency Ordered Stop   06/24/23 2200  piperacillin-tazobactam (ZOSYN) IVPB 3.375 g        3.375 g 12.5 mL/hr over 240 Minutes Intravenous Every 12 hours 06/24/23 1013     06/20/23 1000  piperacillin-tazobactam (ZOSYN) IVPB 3.375 g  Status:  Discontinued        3.375 g 12.5 mL/hr over 240 Minutes Intravenous Every 8 hours 06/20/23 0903 06/24/23 1013        Assessment/Plan POD 5 s/p dx laparoscopy by Dr. 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 Rosalva Comber on 06/20/23 - Intra-op noted to have some murky fluid in the pelvis. All of the small bowel was viable without evidence of ischemia or other abnormality. He did appear to have some venous  congestion in the mesentery. The right colon, transverse colon, left colon, and rectosigmoid all appeared viable. The liver was viable. Case was handed over to vascular, Dr. Rosalva Comber, who performed Stenting of the superior mesenteric artery   - Okay to start TF 20cc/h when OK per CCM - they are going to hold off today in light of e- changes.  - Cont TNA    FEN - above VTE - SCDs, sqh. Cont Plavix, ASA per vascular ID - Zosyn Foley - in place, strict I/O, per primary - okay to d/c from our standpoint   - Per primary -  VDRF - extubated 4/8, off BiPAP AKI  Acute on chronic anemia Hx CAD status post PCI Hx COPD Hx chronic HFrEF Hx AAA s/p repair Hx PAD Hx carotid artery stenosis s/p carotid endarterectomy      LOS: 5 days    Delton Filbert, [facility] Surgery 06/25/2023, 10:19 AM Please see Amion for pager number during day hours 7:00am-4:30pm

## 2023-06-25 NOTE — Progress Notes (Deleted)
 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 Hold if Plt<50 per vascular.  D/w CCM MD Marciano Settles, PharmD, BCCCP Clinical Pharmacist 06/25/2023 2:18 PM

## 2023-06-25 NOTE — Progress Notes (Signed)
 Peripherally Inserted Central Catheter Placement  The IV Nurse has discussed with the patient and/or persons authorized to consent for the patient, the purpose of this procedure and the potential benefits and risks involved with this procedure.  The benefits include less needle sticks, lab draws from the catheter, and the patient may be discharged home with the catheter. Risks include, but not limited to, infection, bleeding, blood clot (thrombus formation), and puncture of an artery; nerve damage and irregular heartbeat and possibility to perform a PICC exchange if needed/ordered by physician.  Alternatives to this procedure were also discussed.  Bard Power PICC patient education guide, fact sheet on infection prevention and patient information card has been provided to patient /or left at bedside. Signed telephone consent by wife [name].     PICC Placement Documentation  PICC Triple Lumen 06/25/23 Right Basilic 36 cm 0 cm (Active)  Indication for Insertion or Continuance of Line Administration of hyperosmolar/irritating solutions (i.e. TPN, Vancomycin, etc.) 06/25/23 0943  Exposed Catheter (cm) 0 cm 06/25/23 0943  Site Assessment Clean, Dry, Intact 06/25/23 0943  Lumen #1 Status Flushed;Saline locked;Blood return noted 06/25/23 0943  Lumen #2 Status Flushed;Saline locked;Blood return noted 06/25/23 0943  Lumen #3 Status Flushed;Saline locked;Blood return noted 06/25/23 0943  Dressing Type Transparent;Securing device 06/25/23 0943  Dressing Status Antimicrobial disc/dressing in place;Clean, Dry, Intact 06/25/23 0943  Line Adjustment (NICU/IV Team Only) No 06/25/23 0943  Dressing Intervention New dressing 06/25/23 0943  Dressing Change Due 07/02/23 06/25/23 0943       Erik [name] 06/25/2023, 9:44 AM

## 2023-06-25 NOTE — Plan of Care (Signed)
  Problem: Health Behavior/Discharge Planning: Goal: Ability to manage health-related needs will improve Outcome: Progressing   Problem: Clinical Measurements: Goal: Ability to maintain clinical measurements within normal limits will improve Outcome: Progressing Goal: Will remain free from infection Outcome: Progressing Goal: Diagnostic test results will improve Outcome: Progressing Goal: Respiratory complications will improve Outcome: Progressing Goal: Cardiovascular complication will be avoided Outcome: Progressing   Problem: Activity: Goal: Risk for activity intolerance will decrease Outcome: Progressing   Problem: Nutrition: Goal: Adequate nutrition will be maintained Outcome: Progressing   Problem: Coping: Goal: Level of anxiety will decrease Outcome: Progressing   Problem: Elimination: Goal: Will not experience complications related to bowel motility Outcome: Progressing Goal: Will not experience complications related to urinary retention Outcome: Progressing   Problem: Pain Managment: Goal: General experience of comfort will improve and/or be controlled Outcome: Progressing   Problem: Education: Goal: Understanding of CV disease, CV risk reduction, and recovery process will improve Outcome: Progressing Goal: Individualized Educational Video(s) Outcome: Progressing   Problem: Activity: Goal: Ability to return to baseline activity level will improve Outcome: Progressing   Problem: Cardiovascular: Goal: Ability to achieve and maintain adequate cardiovascular perfusion will improve Outcome: Progressing Goal: Vascular access site(s) Level 0-1 will be maintained Outcome: Progressing   Problem: Health Behavior/Discharge Planning: Goal: Ability to safely manage health-related needs after discharge will improve Outcome: Progressing   Problem: Education: Goal: Ability to describe self-care measures that may prevent or decrease complications (Diabetes Survival  Skills Education) will improve Outcome: Not Progressing Goal: Individualized Educational Video(s) Outcome: Not Applicable   Problem: Coping: Goal: Ability to adjust to condition or change in health will improve Outcome: Progressing   Problem: Health Behavior/Discharge Planning: Goal: Ability to identify and utilize available resources and services will improve Outcome: Progressing Goal: Ability to manage health-related needs will improve Outcome: Progressing

## 2023-06-25 NOTE — Progress Notes (Signed)
 NAME:  Erik [name]., MRN:  [id number], DOB:  [birth year], LOS: 5 ADMISSION DATE:  06/19/2023, CONSULTATION DATE:  06/20/2023 REFERRING MD:   Dr. Ascension Lavender  CHIEF COMPLAINT:  Abd pain    History of Present Illness:  83 year old male patient with a known history of severe coronary artery disease with prior coronary artery stenting as well as AAA repair complicated further by heart failure with reduced EF COPD and tobacco abuse presented to the emergency room at [facility] on 4/6 with 2-day history of progressive abdominal pain which had been ongoing and intermittent over 2 to 3 months but acutely worse.  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  Returns to the intensive care on mechanical ventilator critical care asked to evaluate.  Pertinent  Medical History  CAD, status post PCI Chronic HF  AAA s/p repair COPD  Severe PAD Tobacco use   Significant Hospital Events: Including procedures, antibiotic start and stop dates in addition to other pertinent events   4/7  s/p dx laparoscopy by Dr. Hildy Lowers and stenting of the superior mesenteric artery  4/8 Extubated  4/9 Stable  4/10 Placed on BiPAP  4/11 Weaned of BiPAP and Precedex  Interim History / Subjective:  Remained off BiPAP since yesterday and weaned to [place]  Objective   Blood pressure (!) 149/57, pulse 97, temperature 99.1 F (37.3 C), temperature source Axillary, resp. rate 20, height 5\' 6"  (1.676 m), weight 51.8 kg, SpO2 94%. CVP:  [4 mmHg-5 mmHg] 4 mmHg      Intake/Output Summary (Last 24 hours) at 06/25/2023 0946 Last data filed at 06/25/2023 0800 Gross per 24 hour  Intake 2055.68 ml  Output 1900 ml  Net  155.68 ml   Filed Weights   06/23/23 0426 06/24/23 0500 06/25/23 0454  Weight: 51.5 kg 51.6 kg 51.8 kg   Physical Exam: General: Chronically ill-appearing, intermittently agitated, delirious HENT: [place], AT Eyes: EOMI, no scleral icterus Respiratory: Diminished to auscultation bilaterally.  No crackles, wheezing or rales Cardiovascular: RRR, -M/R/G, no JVD Abdomen: BS+ soft, diffuse tenderness Extremities:-Edema,-tenderness Neuro: Eyes closed, encephalopathic, moves extremities x 4, CNII-XII grossly intact  Na 156 K 2.9 Cl 123 BUN/Cr 85/1.59 Phos  AST/ALT - improving  Plt 72, decreased Resolved Hospital Problem list   Ventilator management with underlying COPD chronic tobacco use Ischemic bowel lactic acidosis status post stent placement in the SMA  Assessment & Plan:   Acute hypoxemic respiratory failure 2/2 aspiration pneumonia COPD - Weaned off BiPAP - Wean supplemental O2 for goal >88% - Continue nebs - Continue Zosyn 4/7>  Agitation requiring sedation  ICU delirium - Off precedex - Seroquel  AKI - improving - TPN - Monitor UOP/Cr - Avoid nephrotoxic agents - FWF as above  CAD s/p PCI, hx carotid artery stenosis s/p endarterectomy Hx AAA s/p repair SMA stent 4/7 PVD - Vascular following - ASA, Plavix  Abdominal pain s/p ex-lap 4/7 - No evidence of ischemic bowel but murky fluid in pelvis Elevated LFTs - PRN tylenol and low dose dilaudid  - Trend  Hx CVA - Plavix, ASA  Hypernatremia - Adjust TPN as noted below - Continue FWF  Refeeding syndrome: Hypernatremia, hypokalemia and hypophosphatemia - Replete - Pharmacy team adjusting TPN -  F/u BMET at 1700  BPH - Foley  Best Practice (right click and "Reselect all SmartList Selections" daily)   Diet/type: NPO and TPN DVT prophylaxis: prophylactic heparin  GI prophylaxis: PPI Lines: Central line and Arterial Line Foley:  Yes, and it is still needed Code Status:  full code Last date of  multidisciplinary goals of care discussion [06/24/23] Confirmed full code with wife  Critical care time: 44 Minutes   The patient is critically ill with multiple organ systems failure and requires high complexity decision making for assessment and support, frequent evaluation and titration of therapies, application of advanced monitoring technologies and extensive interpretation of multiple databases.  Independent Critical Care Time: 35 Minutes.   Genetta Kenning, M.D. [facility] Pulmonary/Critical Care Medicine 06/25/2023 9:46 AM   Please see Amion for pager number to reach on-call Pulmonary and Critical Care Team.

## 2023-06-26 DIAGNOSIS — J9601 Acute respiratory failure with hypoxia: Secondary | ICD-10-CM | POA: Diagnosis not present

## 2023-06-26 DIAGNOSIS — J690 Pneumonitis due to inhalation of food and vomit: Secondary | ICD-10-CM | POA: Diagnosis not present

## 2023-06-26 DIAGNOSIS — N179 Acute kidney failure, unspecified: Secondary | ICD-10-CM | POA: Diagnosis not present

## 2023-06-26 DIAGNOSIS — E43 Unspecified severe protein-calorie malnutrition: Secondary | ICD-10-CM | POA: Diagnosis not present

## 2023-06-26 LAB — BASIC METABOLIC PANEL WITH GFR
Anion gap: 8 (ref 5–15)
Anion gap: 7 (ref 5–15)
BUN: 63 mg/dL — ABNORMAL HIGH (ref 8–23)
BUN: 55 mg/dL — ABNORMAL HIGH (ref 8–23)
CO2: 23 mmol/L (ref 22–32)
CO2: 20 mmol/L — ABNORMAL LOW (ref 22–32)
Calcium: 8 mg/dL — ABNORMAL LOW (ref 8.9–10.3)
Calcium: 8 mg/dL — ABNORMAL LOW (ref 8.9–10.3)
Chloride: 124 mmol/L — ABNORMAL HIGH (ref 98–111)
Chloride: 126 mmol/L — ABNORMAL HIGH (ref 98–111)
Creatinine, Ser: 1.21 mg/dL (ref 0.61–1.24)
Creatinine, Ser: 1.14 mg/dL (ref 0.61–1.24)
GFR, Estimated: 60 mL/min — ABNORMAL LOW (ref >60)
GFR, Estimated: >60 mL/min (ref >60)
Glucose, Bld: 141 mg/dL — ABNORMAL HIGH (ref 70–99)
Glucose, Bld: 147 mg/dL — ABNORMAL HIGH (ref 70–99)
Potassium: 3.2 mmol/L — ABNORMAL LOW (ref 3.5–5.1)
Potassium: 3.2 mmol/L — ABNORMAL LOW (ref 3.5–5.1)
Sodium: 155 mmol/L — ABNORMAL HIGH (ref 135–145)
Sodium: 153 mmol/L — ABNORMAL HIGH (ref 135–145)

## 2023-06-26 LAB — MAGNESIUM
Magnesium: 1.9 mg/dL (ref 1.7–2.4)
Magnesium: 2.5 mg/dL — ABNORMAL HIGH (ref 1.7–2.4)

## 2023-06-26 LAB — GLUCOSE, CAPILLARY
Glucose-Capillary: 103 mg/dL — ABNORMAL HIGH (ref 70–99)
Glucose-Capillary: 133 mg/dL — ABNORMAL HIGH (ref 70–99)
Glucose-Capillary: 114 mg/dL — ABNORMAL HIGH (ref 70–99)

## 2023-06-26 LAB — CBC
HCT: 27.5 % — ABNORMAL LOW (ref 39.0–52.0)
Hemoglobin: 9.1 g/dL — ABNORMAL LOW (ref 13.0–17.0)
MCH: 27.9 pg (ref 26.0–34.0)
MCHC: 33.1 g/dL (ref 30.0–36.0)
MCV: 84.4 fL (ref 80.0–100.0)
Platelets: 52 10*3/uL — ABNORMAL LOW (ref 150–400)
RBC: 3.26 MIL/uL — ABNORMAL LOW (ref 4.22–5.81)
RDW: 16.7 % — ABNORMAL HIGH (ref 11.5–15.5)
WBC: 7.3 10*3/uL (ref 4.0–10.5)
nRBC: 0.3 % — ABNORMAL HIGH (ref 0.0–0.2)

## 2023-06-26 LAB — HEPARIN INDUCED PLATELET AB (HIT ANTIBODY): Heparin Induced Plt Ab: 0.169 {OD_unit} (ref 0.000–0.400)

## 2023-06-26 LAB — MRSA NEXT GEN BY PCR, NASAL: MRSA by PCR Next Gen: DETECTED — AB

## 2023-06-26 MED ORDER — OXYCODONE HCL 5 MG PO TABS
5.0000 mg | ORAL_TABLET | ORAL | Status: DC | PRN
  Administered 2023-06-26 – 2023-06-27 (×3): 5 mg via ORAL
  Filled 2023-06-26 (×3): qty 1

## 2023-06-26 MED ORDER — POTASSIUM CHLORIDE 20 MEQ PO PACK
40.0000 meq | PACK | Freq: Once | ORAL | Status: AC
  Administered 2023-06-26: 40 meq
  Filled 2023-06-26: qty 2

## 2023-06-26 MED ORDER — POTASSIUM CHLORIDE 20 MEQ PO PACK: 20.0000 meq | PACK | ORAL | Status: DC

## 2023-06-26 MED ORDER — MAGNESIUM SULFATE 2 GM/50ML IV SOLN: 2.0000 g | Freq: Once | INTRAVENOUS | Status: DC

## 2023-06-26 MED ORDER — POTASSIUM CHLORIDE 10 MEQ/50ML IV SOLN
10.0000 meq | INTRAVENOUS | Status: AC
  Administered 2023-06-26 – 2023-06-27 (×6): 10 meq via INTRAVENOUS
  Filled 2023-06-26 (×8): qty 50

## 2023-06-26 MED ORDER — METOPROLOL TARTRATE 25 MG PO TABS
25.0000 mg | ORAL_TABLET | Freq: Two times a day (BID) | ORAL | Status: DC
  Administered 2023-06-26 – 2023-06-28 (×5): 25 mg
  Filled 2023-06-26 (×5): qty 1

## 2023-06-26 MED ORDER — FREE WATER
300.0000 mL | Status: DC
Start: 2023-06-26 — End: 2023-06-26

## 2023-06-26 MED ORDER — FREE WATER
150.0000 mL | Status: DC
  Administered 2023-06-26 – 2023-06-27 (×11): 150 mL

## 2023-06-26 MED ORDER — MAGNESIUM SULFATE 2 GM/50ML IV SOLN
2.0000 g | Freq: Once | INTRAVENOUS | Status: AC
  Administered 2023-06-26: 2 g via INTRAVENOUS
  Filled 2023-06-26: qty 50

## 2023-06-26 MED ORDER — AMLODIPINE BESYLATE 5 MG PO TABS
5.0000 mg | ORAL_TABLET | Freq: Every day | ORAL | Status: DC
  Administered 2023-06-26 – 2023-06-28 (×2): 5 mg
  Filled 2023-06-26 (×4): qty 1

## 2023-06-26 MED ORDER — POTASSIUM CHLORIDE 20 MEQ PO PACK
40.0000 meq | PACK | ORAL | Status: AC
  Administered 2023-06-26 (×2): 40 meq
  Filled 2023-06-26 (×2): qty 2

## 2023-06-26 MED ORDER — FAT EMUL FISH OIL/PLANT BASED 20% (SMOFLIPID)IV EMUL
INTRAVENOUS | Status: AC
  Filled 2023-06-26: qty 806.4

## 2023-06-26 MED ORDER — MUPIROCIN 2 % EX OINT
1.0000 | TOPICAL_OINTMENT | Freq: Two times a day (BID) | CUTANEOUS | Status: AC
  Administered 2023-06-26 – 2023-06-30 (×10): 1 via NASAL
  Filled 2023-06-26 (×4): qty 22

## 2023-06-26 MED ORDER — CHLORHEXIDINE GLUCONATE CLOTH 2 % EX PADS
6.0000 | MEDICATED_PAD | Freq: Every day | CUTANEOUS | Status: AC
  Administered 2023-06-26 – 2023-06-29 (×5): 6 via TOPICAL

## 2023-06-26 MED ORDER — HYDROMORPHONE HCL 1 MG/ML IJ SOLN
0.5000 mg | INTRAMUSCULAR | Status: DC | PRN
  Administered 2023-06-26 – 2023-06-28 (×4): 0.5 mg via INTRAVENOUS
  Filled 2023-06-26 (×4): qty 1

## 2023-06-26 MED ORDER — POTASSIUM CHLORIDE 10 MEQ/50ML IV SOLN: 10.0000 meq | INTRAVENOUS | Status: DC

## 2023-06-26 NOTE — Progress Notes (Signed)
 Pharmacy note: electrolyte replacement   K= 3.2 (s/p total oral potassium); no change from this morning -Mg= 2.5 -SCr 1.1  Plan -Potassium 10meq IV x6 doses plus 40meq po -Recheck labs in the morning  Baxter Limber, PharmD Clinical Pharmacist **Pharmacist phone directory can now be found on [email or web address] ([email or web address]).  Listed under [facility].

## 2023-06-26 NOTE — Progress Notes (Signed)
 NAME:  Erik Holtsclaw., MRN:  [id number], DOB:  1940/10/10, LOS: 6 ADMISSION DATE:  06/19/2023, CONSULTATION DATE:  06/20/2023 REFERRING MD:   Dr. Toniann Fail  CHIEF COMPLAINT:  Abd pain    History of Present Illness:  83 year old male patient with a known history of severe coronary artery disease with prior coronary artery stenting as well as AAA repair complicated further by heart failure with reduced EF COPD and tobacco abuse presented to the emergency room at [facility] on 4/6 with 2-day history of progressive abdominal pain which had been ongoing and intermittent over 2 to 3 months but acutely worse.  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  Returns to the intensive care on mechanical ventilator critical care asked to evaluate.  Pertinent  Medical History  CAD, status post PCI Chronic HF  AAA s/p repair COPD  Severe PAD Tobacco use   Significant Hospital Events: Including procedures, antibiotic start and stop dates in addition to other pertinent events   4/7  s/p dx laparoscopy by Dr. Janee Morn and stenting of the superior mesenteric artery  4/8 Extubated  4/9 Stable  4/10 Placed on BiPAP  4/11 Weaned of BiPAP and Precedex  Interim History / Subjective:  Less delirious this morning Failed bedside swallow Less abdominal pain  Objective   Blood pressure (!) 159/54, pulse 99, temperature 97.8 F (36.6 C), temperature source Oral, resp. rate (!) 24, height 5\' 6"  (1.676 m), weight 34.3 kg, SpO2 94%. CVP:  [3 mmHg-5 mmHg] 3 mmHg      Intake/Output Summary (Last 24 hours) at 06/26/2023 0824 Last data filed at 06/26/2023 0700 Gross per 24 hour  Intake 2686.83 ml   Output 2785 ml  Net -98.17 ml   Filed Weights   06/24/23 0500 06/25/23 0454 06/26/23 0400  Weight: 51.6 kg 51.8 kg 34.3 kg   Physical Exam: General: Chronically ill-appearing, no acute distress, improved delirium HENT: [place], AT, OP clear, MMM, NGT Eyes: EOMI, no scleral icterus Respiratory: Diminished to auscultation bilaterally.  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 DC - Wean supplemental O2 for goal >88% - Continue nebs - Complete Zosyn for 7 days. 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 Recommended to hold Plavix for plt <50K - Holding ASA and Plavix - Resume home metoprolol. Add amlodipine  Thrombocytopenia - HIT score 3. May be due to sepsis, critical illness, ?Zosyn - Hold ASA, heparin - Zosyn course completed today - Monitor with transfusion goal >10K -  HIT ab pending  Abdominal pain s/p ex-lap 4/7 - No evidence of ischemic bowel but murky fluid in pelvis Elevated LFTs - PRN tylenol and low dose dilaudid  - Trend  Hx CVA - Holding Plavix, ASA  Hypernatremia - Adjust TPN as noted below - Increased FWF  Refeeding syndrome: Hypernatremia, hypokalemia and hypophosphatemia -  Replete electrolytes - Pharmacy team adjusting TPN - F/u BMET at 1700  BPH - Foley  Best Practice (right click and "Reselect all SmartList Selections" daily)   Diet/type: NPO and TPN DVT prophylaxis: prophylactic heparin  GI prophylaxis: PPI Lines: Central line and Arterial Line Foley:  Yes, and it is still needed Code Status:  full code Last date of multidisciplinary goals of care discussion [06/24/23] Confirmed full code with wife  Critical care time: 81 Minutes   The patient is critically ill with multiple organ systems failure and requires high complexity decision making for assessment and support, frequent evaluation and titration of therapies, application of advanced monitoring technologies and extensive interpretation of multiple databases.  Independent Critical Care Time: 35 Minutes.   Erik [name], M.D. [facility] Pulmonary/Critical Care Medicine 06/26/2023 8:24 AM   Please see Amion for pager number to reach on-call Pulmonary and Critical Care Team.

## 2023-06-26 NOTE — Progress Notes (Signed)
Hold plavix. 

## 2023-06-26 NOTE — Plan of Care (Signed)
   Problem: Clinical Measurements: Goal: Cardiovascular complication will be avoided Outcome: Progressing   Problem: Pain Managment: Goal: General experience of comfort will improve and/or be controlled Outcome: Progressing

## 2023-06-26 NOTE — Progress Notes (Signed)
 RT NOTE: patient resting on 6L [place].  Bipap ordered for PRN use.  Currently not indicated.  Will continue to monitor and assess for bipap needs.

## 2023-06-26 NOTE — Progress Notes (Signed)
 PHARMACY - TOTAL PARENTERAL NUTRITION CONSULT NOTE   Indication:  malnutrition, suspected ileus  Patient Measurements: Height: 5\' 6"  (167.6 cm) Weight: 34.3 kg (75 lb 9.9 oz) IBW/kg (Calculated) : 63.8 TPN AdjBW (KG): 50.8 Body mass index is 12.2 kg/m.  Assessment:  83yoM admitted 4/7 for complaints of acute on chronic abdominal pain (2 - 3 month history, worsened for 2 days prior to admission). Initial CT abdomen/pelvis showed concern for early SBO vs ileus as well as stenosis of celiac artery and SMA, mesenteric stent placed. Underwent diagnostic laparoscopy and mesenteric angiography on 4/7, no bowel ischemia appreciated. All of small bowel viable per surgery note.  Has had weight loss over past several years from 132 lbs to 112 lbs despite normal dietary intake, now has muscle/fat depletion with concern for severe malnutrition on RD exam. Has not received enteral nutrition since admission on 4/7, NG tube to suction. Per vascular surgery team, abdominal pain may be 2/2 edematous bowel so holding tube feeding. Pharmacy consulted for TPN 4/9.  Glucose / Insulin: last A1c 5.6, CBGs controlled on goal TPN, 3u SSI 4/7 Dexamethasone 10 mg x1 4/9 D5LR @ 100 x 6 hours with PM labs to assess for refeeding - in total received 33g  Electrolytes: K 3.2 (s/p 126 mEq outside TPN 4/12; losses via diarrhea), Na up 155 (FWF 200 q4; Na removed from TPN; D5W at 75 ml/hr added 4/12 x13 hrs), Cl up 124, CO2 23, CoCa ~10, Phos 3.6 s/p 45 mmol KPhos 4/12), Mg 1.9 -Last Furosemide 4/10.  Renal: Scr trend down 1.21 (baseline <1), BUN trend down 63 (22 on admission) Hepatic: LFTs trend down, Albumin down 1.5, Tbili 0.7 Intake / Output; MIVF: 2.6 mL/kg/h UOP; dark tarry BM 4/8, FOBT+; Stool output 650 mL, type 7 brown; abd distended/taut/hypoactive/tender) - given Reglan 4/11 x1 Thiamine x7 days per RD (4/10 - 4/16)  GI Imaging: 4/6 CT: multiple fluid filled mildly dilated loops of small bowel 4/7 KUB:  decreased gaseous distension of small bowel loops, retained stool in R colon 4/7 KUB SBO protocol: contrast within colonic lumen GI Surgeries / Procedures:  4/7 Diagnostic laparoscopy and mesenteric angiography  Central access: R internal jugular 4/7 TPN start date: 4/10  Nutritional Goals: Goal TPN rate is 60 mL/hr (provides 81 g of protein and 1,527 kcals per day) Prosource 60 mL daily (started 4/12) Vital 1.5 @ 20 ml/hr - OFF   RD Assessment: Estimated Needs Total Energy Estimated Needs: 1500-1800 kcal/d Total Protein Estimated Needs: 80-95g/d Total Fluid Estimated Needs: 1.8L/d  Current Nutrition:  Tube feeding (currently off) + TPN  Plan:  Continue TPN at goal 60 mL/h at 1800 Electrolytes in TPN: removed Na 4/11; increase to K 50 mEq/L (total 70 mEq), decrease Ca to 3 mEq/L (total ~4.3 mEq), Mg 5 mEq/L, increase Phos to 15 mmol/L (total 17 mmol). Max acetate Supplement Magnesium 2g IV x1 today and KCl 40 mEq q4h x2  Add standard MVI and trace elements to TPN Continue sensitive q6h SSI and adjust as needed  - if continue low/no usage, consider discontinuing  F/u TF tolerance, ability to wean TPN Monitor TPN labs on Mon/Thurs, next labs 4/14 AM Monitor enteral feed toleration for ability to advance and wean TPN.   Lenard Quam, PharmD, BCPS, BCCCP Please refer to [facility] for [facility] Pharmacy numbers 06/26/2023,8:05 AM

## 2023-06-26 NOTE — Progress Notes (Signed)
 6 Days Post-Op  Subjective: CC: Awake and alert today. Orienteated x 4. Central abdominal pain that is well controlled. No n/v. Failed swallow. About to get tf's started. Having bm's - dark/black liquid stools. Foley w/ good uop.   Afebrile. Tachycardia resolved. No systolic hypotension. On sch BB.  Hgb 9.1 (10.1). Plt 52k.   Objective: Vital signs in last 24 hours: Temp:  [97.7 F (36.5 C)-98.7 F (37.1 C)] 97.7 F (36.5 C) (04/13 0800) Pulse Rate:  [84-112] 96 (04/13 0800) Resp:  [14-30] 24 (04/13 0800) BP: (107-167)/(34-68) 157/64 (04/13 0800) SpO2:  [84 %-98 %] 94 % (04/13 0808) Weight:  [34.3 kg] 34.3 kg (04/13 0400) Last BM Date : 06/25/23  Intake/Output from previous day: 04/12 0701 - 04/13 0700 In: 2811.2 [I.V.:1445.2; NG/GT:760; IV Piggyback:606] Out: 2785 [Urine:2135; Stool:650] Intake/Output this shift: Total I/O In: 180 [I.V.:60; NG/GT:120] Out: 230 [Urine:230]  PE: Gen:  Alert, NAD, pleasant Abd: At least mild distension, gen ttp more on the R, + BS. Incisions w/ glue in place, cdi.   Lab Results:  Recent Labs    06/25/23 1030 06/26/23 0428  WBC 6.9 7.3  HGB 10.1* 9.1*  HCT 30.5* 27.5*  PLT 62* 52*   BMET Recent Labs    06/25/23 1640 06/26/23 0428  NA 152* 155*  K 3.0* 3.2*  CL 122* 124*  CO2 25 23  GLUCOSE 138* 141*  BUN 72* 63*  CREATININE 1.28* 1.21  CALCIUM 7.9* 8.0*   PT/INR No results for input(s): "LABPROT", "INR" in the last 72 hours. CMP     Component Value Date/Time   NA 155 (H) 06/26/2023 0428   NA 138 08/18/2018 1135   K 3.2 (L) 06/26/2023 0428   CL 124 (H) 06/26/2023 0428   CO2 23 06/26/2023 0428   GLUCOSE 141 (H) 06/26/2023 0428   BUN 63 (H) 06/26/2023 0428   BUN 19 08/18/2018 1135   CREATININE 1.21 06/26/2023 0428   CREATININE 1.11 10/09/2021 1646   CALCIUM 8.0 (L) 06/26/2023 0428   PROT 5.1 (L) 06/25/2023 0430   ALBUMIN 1.5 (L) 06/25/2023 1640   AST 107 (H) 06/25/2023 0430   ALT 205 (H) 06/25/2023 0430    ALKPHOS 35 (L) 06/25/2023 0430   BILITOT 0.7 06/25/2023 0430   GFRNONAA 60 (L) 06/26/2023 0428   GFRAA 95 08/18/2018 1135   Lipase     Component Value Date/Time   LIPASE 61 (H) 03/09/2022 0812    Studies/Results: US  EKG SITE RITE Result Date: 06/24/2023 If Site Rite image not attached, placement could not be confirmed due to current cardiac rhythm.  DG CHEST PORT 1 VIEW Result Date: 06/24/2023 CLINICAL DATA:  Central line placement EXAM: PORTABLE CHEST 1 VIEW COMPARISON:  06/22/2023 FINDINGS: Right internal jugular vascular sheath remains in place, unchanged. No additional central line visualized. Feeding tube enters the stomach, the distal aspect of the tube not visualized on this chest x-ray. Heart and mediastinal contours within normal limits. Patchy bilateral airspace disease is similar prior study. No visible effusions or pneumothorax. No acute bony abnormality. IMPRESSION: Right internal jugular vascular sheath remains in place, unchanged. No pneumothorax. Patchy bilateral airspace disease concerning for pneumonia. No real change. Electronically Signed   By: Janeece Mechanic M.D.   On: 06/24/2023 20:12   ECHOCARDIOGRAM COMPLETE Result Date: 06/24/2023    ECHOCARDIOGRAM REPORT   Patient Name:   Deron Poole. Date of Exam: 06/24/2023 Medical Rec #:  [id number]  Height:       66.0 in Accession #:    [id number]            Weight:       113.8 lb Date of Birth:  09/09/1940              BSA:          1.573 m Patient Age:    83 years              BP:           104/69 mmHg Patient Gender: M                     HR:           83 bpm. Exam Location:  Inpatient Procedure: 2D Echo, Cardiac Doppler and Color Doppler (Both Spectral and Color            Flow Doppler were utilized during procedure). Indications:    Shortness of breath  History:        Patient has prior history of Echocardiogram examinations, most                 recent 02/03/2022. CHF, CAD, COPD; Risk Factors:Hypertension.   Sonographer:    Astrid Blamer Referring Phys: [id number] [facility] JANE ELLISON IMPRESSIONS  1. Left ventricular ejection fraction, by estimation, is 50 to 55%. The left ventricle has low normal function. The left ventricle has no regional wall motion abnormalities. There is mild left ventricular hypertrophy. Left ventricular diastolic parameters are indeterminate.  2. Right ventricular systolic function is normal. The right ventricular size is normal.  3. Left atrial size was moderately dilated.  4. Right atrial size was mildly dilated.  5. The mitral valve is degenerative. Trivial mitral valve regurgitation. Moderate mitral stenosis. The mean mitral valve gradient is 9.5 mmHg. Severe mitral annular calcification.  6. The aortic valve is tricuspid. There is severe calcifcation of the aortic valve. Aortic valve regurgitation is severe. Mild aortic valve stenosis. Aortic valve area, by VTI measures 1.51 cm. Aortic valve mean gradient measures 10.0 mmHg. Aortic valve Vmax measures 2.14 m/s.  7. The inferior vena cava is normal in size with greater than 50% respiratory variability, suggesting right atrial pressure of 3 mmHg. FINDINGS  Left Ventricle: Left ventricular ejection fraction, by estimation, is 50 to 55%. The left ventricle has low normal function. The left ventricle has no regional wall motion abnormalities. The left ventricular internal cavity size was normal in size. There is mild left ventricular hypertrophy. Abnormal (paradoxical) septal motion, consistent with left bundle branch block. Left ventricular diastolic parameters are indeterminate. Right Ventricle: The right ventricular size is normal. No increase in right ventricular wall thickness. Right ventricular systolic function is normal. Left Atrium: Left atrial size was moderately dilated. Right Atrium: Right atrial size was mildly dilated. Pericardium: There is no evidence of pericardial effusion. Mitral Valve: The mitral valve is degenerative in appearance.  Severe mitral annular calcification. Trivial mitral valve regurgitation. Moderate mitral valve stenosis. MV peak gradient, 17.6 mmHg. The mean mitral valve gradient is 9.5 mmHg. Tricuspid Valve: The tricuspid valve is normal in structure. Tricuspid valve regurgitation is trivial. No evidence of tricuspid stenosis. Aortic Valve: The aortic valve is tricuspid. There is severe calcifcation of the aortic valve. Aortic valve regurgitation is severe. Mild aortic stenosis is present. Aortic valve mean gradient measures 10.0 mmHg. Aortic valve peak gradient measures 18.3 mmHg. Aortic valve area, by VTI measures 1.51  cm. Pulmonic Valve: The pulmonic valve was normal in structure. Pulmonic valve regurgitation is not visualized. No evidence of pulmonic stenosis. Aorta: The aortic root is normal in size and structure. Venous: The inferior vena cava was not well visualized. The inferior vena cava is normal in size with greater than 50% respiratory variability, suggesting right atrial pressure of 3 mmHg. IAS/Shunts: No atrial level shunt detected by color flow Doppler.  LEFT VENTRICLE PLAX 2D LVIDd:         4.40 cm   Diastology LVIDs:         4.10 cm   LV e' medial:    5.00 cm/s LV PW:         1.00 cm   LV E/e' medial:  24.6 LV IVS:        1.30 cm   LV e' lateral:   5.11 cm/s LVOT diam:     1.80 cm   LV E/e' lateral: 24.1 LV SV:         62 LV SV Index:   39 LVOT Area:     2.54 cm  RIGHT VENTRICLE RV S prime:     18.00 cm/s TAPSE (M-mode): 2.8 cm LEFT ATRIUM           Index        RIGHT ATRIUM           Index LA Vol (A4C): 35.5 ml 22.56 ml/m  RA Area:     13.40 cm                                    RA Volume:   29.90 ml  19.00 ml/m  AORTIC VALVE AV Area (Vmax):    1.56 cm AV Area (Vmean):   1.47 cm AV Area (VTI):     1.51 cm AV Vmax:           214.00 cm/s AV Vmean:          151.000 cm/s AV VTI:            0.408 m AV Peak Grad:      18.3 mmHg AV Mean Grad:      10.0 mmHg LVOT Vmax:         131.00 cm/s LVOT Vmean:         87.400 cm/s LVOT VTI:          0.242 m LVOT/AV VTI ratio: 0.59  AORTA Ao Root diam: 2.90 cm MITRAL VALVE                TRICUSPID VALVE MV Area (PHT): 3.03 cm     TR Peak grad:   35.8 mmHg MV Area VTI:   1.37 cm     TR Vmax:        299.00 cm/s MV Peak grad:  17.6 mmHg MV Mean grad:  9.5 mmHg     SHUNTS MV Vmax:       2.10 m/s     Systemic VTI:  0.24 m MV Vmean:      146.0 cm/s   Systemic Diam: 1.80 cm MV Decel Time: 250 msec MV E velocity: 123.00 cm/s MV A velocity: 172.00 cm/s MV E/A ratio:  0.72 Jules Oar MD Electronically signed by Jules Oar MD Signature Date/Time: 06/24/2023/1:08:14 PM    Final    DG Abd Portable 1V Result Date: 06/24/2023 CLINICAL DATA:  Feeding tube placement. EXAM: PORTABLE ABDOMEN -  1 VIEW COMPARISON:  06/20/2023 FINDINGS: New weighted enteric tube with tip in the left upper quadrant in the region of the proximal jejunum. The previous non weighted enteric tube has been removed. Aorto bi-iliac stent graft in place. Persistent gaseous colonic distension. Prominent but not abnormally dilated small bowel. IMPRESSION: Weighted enteric tube with tip in the left upper quadrant in the region of the proximal jejunum. Electronically Signed   By: Chadwick Colonel M.D.   On: 06/24/2023 12:39    Anti-infectives: Anti-infectives (From admission, onward)    Start     Dose/Rate Route Frequency Ordered Stop   06/24/23 2200  piperacillin-tazobactam (ZOSYN) IVPB 3.375 g        3.375 g 12.5 mL/hr over 240 Minutes Intravenous Every 12 hours 06/24/23 1013 06/26/23 2359   06/20/23 1000  piperacillin-tazobactam (ZOSYN) IVPB 3.375 g  Status:  Discontinued        3.375 g 12.5 mL/hr over 240 Minutes Intravenous Every 8 hours 06/20/23 0903 06/24/23 1013        Assessment/Plan POD 6 s/p dx laparoscopy by Dr. 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 Rosalva Comber on 06/20/23 - Intra-op noted to have some murky fluid in the pelvis. All of the small bowel was  viable without evidence of ischemia or other abnormality. He did appear to have some venous congestion in the mesentery. The right colon, transverse colon, left colon, and rectosigmoid all appeared viable. The liver was viable. Case was handed over to vascular, Dr. Rosalva Comber, who performed Stenting of the superior mesenteric artery   - Okay to start TF 20cc/h when OK per CCM - Cont TNA    FEN -  Failed swallow. Okay to start trickle tf's. On TPN. 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 Plt 52k ID - Zosyn completes today.  Foley - in place, strict I/O, per primary - okay to d/c from our standpoint   - Per primary -  VDRF - extubated 4/8, off BiPAP AKI - resolved.  Electrolyte abnormalities.  Acute on chronic anemia Hx CAD status post PCI Hx COPD Hx chronic HFrEF Hx AAA s/p repair Hx PAD Hx carotid artery stenosis s/p carotid endarterectomy      LOS: 6 days    Delton Filbert, [facility] 06/26/2023, 9:18 AM Please see Amion for pager number during day hours 7:00am-4:30pm

## 2023-06-27 ENCOUNTER — Inpatient Hospital Stay (HOSPITAL_COMMUNITY)

## 2023-06-27 DIAGNOSIS — E870 Hyperosmolality and hypernatremia: Secondary | ICD-10-CM

## 2023-06-27 DIAGNOSIS — R1312 Dysphagia, oropharyngeal phase: Secondary | ICD-10-CM | POA: Diagnosis not present

## 2023-06-27 DIAGNOSIS — I2510 Atherosclerotic heart disease of native coronary artery without angina pectoris: Secondary | ICD-10-CM | POA: Diagnosis not present

## 2023-06-27 DIAGNOSIS — D649 Anemia, unspecified: Secondary | ICD-10-CM | POA: Diagnosis not present

## 2023-06-27 LAB — COMPREHENSIVE METABOLIC PANEL WITH GFR
ALT: 80 U/L — ABNORMAL HIGH (ref 0–44)
AST: 43 U/L — ABNORMAL HIGH (ref 15–41)
Albumin: <1.5 g/dL — ABNORMAL LOW (ref 3.5–5.0)
Alkaline Phosphatase: 41 U/L (ref 38–126)
Anion gap: 9 (ref 5–15)
BUN: 55 mg/dL — ABNORMAL HIGH (ref 8–23)
CO2: 19 mmol/L — ABNORMAL LOW (ref 22–32)
Calcium: 7.7 mg/dL — ABNORMAL LOW (ref 8.9–10.3)
Chloride: 127 mmol/L — ABNORMAL HIGH (ref 98–111)
Creatinine, Ser: 1.26 mg/dL — ABNORMAL HIGH (ref 0.61–1.24)
GFR, Estimated: 57 mL/min — ABNORMAL LOW (ref >60)
Glucose, Bld: 168 mg/dL — ABNORMAL HIGH (ref 70–99)
Potassium: 4.2 mmol/L (ref 3.5–5.1)
Sodium: 155 mmol/L — ABNORMAL HIGH (ref 135–145)
Total Bilirubin: 1.1 mg/dL (ref 0.0–1.2)
Total Protein: 4.8 g/dL — ABNORMAL LOW (ref 6.5–8.1)

## 2023-06-27 LAB — CBC
HCT: 27 % — ABNORMAL LOW (ref 39.0–52.0)
Hemoglobin: 8.7 g/dL — ABNORMAL LOW (ref 13.0–17.0)
MCH: 27.6 pg (ref 26.0–34.0)
MCHC: 32.2 g/dL (ref 30.0–36.0)
MCV: 85.7 fL (ref 80.0–100.0)
Platelets: 63 10*3/uL — ABNORMAL LOW (ref 150–400)
RBC: 3.15 MIL/uL — ABNORMAL LOW (ref 4.22–5.81)
RDW: 16.8 % — ABNORMAL HIGH (ref 11.5–15.5)
WBC: 8.6 10*3/uL (ref 4.0–10.5)
nRBC: 0 % (ref 0.0–0.2)

## 2023-06-27 LAB — PHOSPHORUS: Phosphorus: 2.5 mg/dL (ref 2.5–4.6)

## 2023-06-27 LAB — TRIGLYCERIDES: Triglycerides: 142 mg/dL (ref <150)

## 2023-06-27 LAB — GLUCOSE, CAPILLARY
Glucose-Capillary: 125 mg/dL — ABNORMAL HIGH (ref 70–99)
Glucose-Capillary: 139 mg/dL — ABNORMAL HIGH (ref 70–99)
Glucose-Capillary: 147 mg/dL — ABNORMAL HIGH (ref 70–99)
Glucose-Capillary: 167 mg/dL — ABNORMAL HIGH (ref 70–99)

## 2023-06-27 LAB — MAGNESIUM
Magnesium: 2.5 mg/dL — ABNORMAL HIGH (ref 1.7–2.4)
Magnesium: 2.3 mg/dL (ref 1.7–2.4)

## 2023-06-27 MED ORDER — OXYCODONE HCL 5 MG PO TABS
5.0000 mg | ORAL_TABLET | ORAL | Status: DC | PRN
  Administered 2023-06-27 – 2023-06-28 (×2): 5 mg
  Filled 2023-06-27 (×3): qty 1

## 2023-06-27 MED ORDER — FREE WATER
200.0000 mL | Status: DC
  Administered 2023-06-27 (×5): 200 mL
  Administered 2023-06-27: 150 mL
  Administered 2023-06-27: 200 mL
  Administered 2023-06-27: 150 mL
  Administered 2023-06-28: 200 mL
  Administered 2023-06-28 (×2): 150 mL
  Administered 2023-06-28 (×3): 200 mL

## 2023-06-27 MED ORDER — POTASSIUM CHLORIDE 20 MEQ PO PACK
40.0000 meq | PACK | Freq: Once | ORAL | Status: AC
  Administered 2023-06-27: 40 meq
  Filled 2023-06-27: qty 2

## 2023-06-27 MED ORDER — POTASSIUM & SODIUM PHOSPHATES 280-160-250 MG PO PACK
2.0000 | PACK | Freq: Once | ORAL | Status: AC
  Administered 2023-06-27: 2
  Filled 2023-06-27: qty 2

## 2023-06-27 MED ORDER — TRAVASOL 10 % IV SOLN
INTRAVENOUS | Status: AC
  Filled 2023-06-27: qty 806.4

## 2023-06-27 NOTE — Progress Notes (Signed)
 PHARMACY - TOTAL PARENTERAL NUTRITION CONSULT NOTE   Indication:  malnutrition, suspected ileus  Patient Measurements: Height: 5\' 6"  (167.6 cm) Weight: 57.6 kg (126 lb 15.8 oz) IBW/kg (Calculated) : 63.8 TPN AdjBW (KG): 50.8 Body mass index is 20.5 kg/m.  Assessment:  83yoM admitted 4/7 for complaints of acute on chronic abdominal pain (2 - 3 month history, worsened for 2 days prior to admission). Initial CT abdomen/pelvis showed concern for early SBO vs ileus as well as stenosis of celiac artery and SMA, mesenteric stent placed. Underwent diagnostic laparoscopy and mesenteric angiography on 4/7, no bowel ischemia appreciated. All of small bowel viable per surgery note.  Has had weight loss over past several years from 132 lbs to 112 lbs despite normal dietary intake, now has muscle/fat depletion with concern for severe malnutrition on RD exam. Has not received enteral nutrition since admission on 4/7, NG tube to suction. Per vascular surgery team, abdominal pain may be 2/2 edematous bowel so holding tube feeding. Pharmacy consulted for TPN 4/9.  Glucose / Insulin: last A1c 5.6, CBGs <180 on goal TPN, 5u SSI 4/7 Dexamethasone 10 mg x1 4/9 D5LR @ 100 x 6 hours with PM labs to assess for refeeding - in total received 33g  Electrolytes: K 4.2 (s/p 180 mEq outside TPN 4/13), Na up 155 (FWF 200 q4; Na removed from TPN; D5W at 75 ml/hr added 4/12 x13 hrs), Cl up 127, CO2 19, CoCa ~9.7, Phos 2.5, Mg 2.5 Last Furosemide 4/10 Renal: Scr trend down 1.26 (baseline <1), BUN trend down 55 (22 on admission) Hepatic: LFTs trend down, Albumin < 1.5, Tbili 1.1 Intake / Output; MIVF: 0.6 mL/kg/h UOP; FOBT+; LBM 4/13 - Stool output 850 mL Reglan 4/11 x1 Thiamine x7 days per RD (4/10 - 4/16)  GI Imaging: 4/6 CT: multiple fluid filled mildly dilated loops of small bowel 4/7 KUB: decreased gaseous distension of small bowel loops, retained stool in R colon 4/7 KUB SBO protocol: contrast within colonic  lumen GI Surgeries / Procedures:  4/7 Diagnostic laparoscopy and mesenteric angiography  Central access: R internal jugular 4/7 TPN start date: 4/10  Nutritional Goals: Goal TPN rate is 60 mL/hr (provides 81 g of protein and 1,527 kcals per day) Prosource 60 mL daily (started 4/12) Vital 1.5 @ 20 ml/hr  RD Assessment: Estimated Needs Total Energy Estimated Needs: 1500-1800 kcal/d Total Protein Estimated Needs: 80-95g/d Total Fluid Estimated Needs: 1.8L/d  Current Nutrition:  Tube feeding (currently off) + TPN  Plan:  Continue TPN at goal 60 mL/h at 1800 Electrolytes in TPN: removed Na 4/11; continue K 50 mEq/L (total 70 mEq), Ca 3 mEq/L (total ~4.3 mEq), Mg 5 mEq/L, Phos 15 mmol/L (total 21 mmol). Max acetate Supplement KCl 40 mEq per tube, PhosNaK 2 packets outside of TPN Add standard MVI and trace elements to TPN Continue sensitive q6h SSI and adjust as needed  - if continue low/no usage, consider discontinuing  F/u TF tolerance, ability to wean TPN Monitor TPN labs on Mon/Thurs, next labs 4/15 AM Monitor for ability to advance TF and wean TPN.   Heddy Liverpool, PharmD PGY2 Critical Care Pharmacy Resident 06/27/2023,9:08 AM

## 2023-06-27 NOTE — Plan of Care (Signed)
   Problem: Health Behavior/Discharge Planning: Goal: Ability to manage health-related needs will improve Outcome: Progressing   Problem: Clinical Measurements: Goal: Ability to maintain clinical measurements within normal limits will improve Outcome: Progressing Goal: Will remain free from infection Outcome: Progressing Goal: Diagnostic test results will improve Outcome: Progressing Goal: Respiratory complications will improve Outcome: Progressing

## 2023-06-27 NOTE — Progress Notes (Signed)
 7 Days Post-Op  Subjective: CC: Abdominal pain that is greater on the right is stable from yesterday. Tolerating TF's at 20cc/hr. No n/v. Having bm's.   Tmax 99.9. Tachycardia resolved. No systolic hypotension.   Objective: Vital signs in last 24 hours: Temp:  [97.5 F (36.4 C)-99.9 F (37.7 C)] 97.9 F (36.6 C) (04/14 1228) Pulse Rate:  [80-116] 91 (04/14 1228) Resp:  [19-30] 20 (04/14 1228) BP: (105-137)/(44-55) 118/44 (04/14 1228) SpO2:  [93 %-100 %] 98 % (04/14 1228) Weight:  [57.6 kg] 57.6 kg (04/14 0500) Last BM Date : 06/26/23  Intake/Output from previous day: 04/13 0701 - 04/14 0700 In: 761.3 [I.V.:540; NG/GT:121.3; IV Piggyback:100] Out: 1700 [Urine:850; Stool:850] Intake/Output this shift: No intake/output data recorded.  PE: Gen:  Alert, NAD, pleasant Abd: At least mild distension, gen ttp more on the R, + BS. Incisions w/ glue in place, cdi.   Lab Results:  Recent Labs    06/26/23 0428 06/27/23 0515  WBC 7.3 8.6  HGB 9.1* 8.7*  HCT 27.5* 27.0*  PLT 52* 63*   BMET Recent Labs    06/26/23 1705 06/27/23 0515  NA 153* 155*  K 3.2* 4.2  CL 126* 127*  CO2 20* 19*  GLUCOSE 147* 168*  BUN 55* 55*  CREATININE 1.14 1.26*  CALCIUM 8.0* 7.7*   PT/INR No results for input(s): "LABPROT", "INR" in the last 72 hours. CMP     Component Value Date/Time   NA 155 (H) 06/27/2023 0515   NA 138 08/18/2018 1135   K 4.2 06/27/2023 0515   CL 127 (H) 06/27/2023 0515   CO2 19 (L) 06/27/2023 0515   GLUCOSE 168 (H) 06/27/2023 0515   BUN 55 (H) 06/27/2023 0515   BUN 19 08/18/2018 1135   CREATININE 1.26 (H) 06/27/2023 0515   CREATININE 1.11 10/09/2021 1646   CALCIUM 7.7 (L) 06/27/2023 0515   PROT 4.8 (L) 06/27/2023 0515   ALBUMIN <1.5 (L) 06/27/2023 0515   AST 43 (H) 06/27/2023 0515   ALT 80 (H) 06/27/2023 0515   ALKPHOS 41 06/27/2023 0515   BILITOT 1.1 06/27/2023 0515   GFRNONAA 57 (L) 06/27/2023 0515   GFRAA 95 08/18/2018 1135   Lipase      Component Value Date/Time   LIPASE 61 (H) 03/09/2022 2440    Studies/Results: No results found.   Anti-infectives: Anti-infectives (From admission, onward)    Start     Dose/Rate Route Frequency Ordered Stop   06/24/23 2200  piperacillin-tazobactam (ZOSYN) IVPB 3.375 g        3.375 g 12.5 mL/hr over 240 Minutes Intravenous Every 12 hours 06/24/23 1013 06/26/23 2359   06/20/23 1000  piperacillin-tazobactam (ZOSYN) IVPB 3.375 g  Status:  Discontinued        3.375 g 12.5 mL/hr over 240 Minutes Intravenous Every 8 hours 06/20/23 0903 06/24/23 1013        Assessment/Plan POD 7 s/p dx laparoscopy by Dr. 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 Karin Lieu on 06/20/23 - Intra-op noted to have some murky fluid in the pelvis. All of the small bowel was viable without evidence of ischemia or other abnormality. He did appear to have some venous congestion in the mesentery. The right colon, transverse colon, left colon, and rectosigmoid all appeared viable. The liver was viable. Case was handed over to vascular, Dr. Karin Lieu, who performed Stenting of the superior mesenteric artery   - Okay to adv TF's - Cont TNA. Once tolerating TF  adv, can wean TPN   FEN -  Failed swallow. Okay to adv trickle tf's. Once tolerating TF adv, can wean TPN. 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 Plt 57k ID - Zosyn completed Foley - in place, strict I/O, per primary - okay to d/c from our standpoint   - Per primary -  VDRF - extubated 4/8, off BiPAP AKI - resolved.  Electrolyte abnormalities.  Acute on chronic anemia Hx CAD status post PCI Hx COPD Hx chronic HFrEF Hx AAA s/p repair Hx PAD Hx carotid artery stenosis s/p carotid endarterectomy      LOS: 7 days    Delton Filbert, [facility] 06/27/2023, 12:04 PM Please see Amion for pager number during day hours 7:00am-4:30pm

## 2023-06-27 NOTE — Progress Notes (Signed)
 [facility] PROGRESS NOTE   Erik [name]. ZOX:[id number] DOB: 1940/10/28 [name]: 06/19/2023  PCP: Excell Seltzer, MD  Brief History: 83 year old male patient with a known history of severe coronary artery disease with prior coronary artery stenting as well as AAA repair complicated further by heart failure with reduced EF COPD and tobacco abuse presented to the emergency room at [facility] on 4/6 with 2-day history of progressive abdominal pain which had been ongoing and intermittent over 2 to 3 months but acutely worse.  Concern was for mesenteric ischemia.  Patient was hospitalized for further management.  Consultants: Vascular surgery.  Critical care medicine.  General surgery.  Procedures: Exploratory laparotomy.  SMA stenting.    Subjective/Interval History: Patient complains of shortness of breath this morning.  Noted to be coughing which is somewhat chronic for him but worse over the past few days.  His wife is at the bedside.  Complains of discomfort in his abdomen.    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  Patient was seen by vascular surgery.  Was also seen by general surgery. Underwent diagnostic laparoscopy followed by SMA stenting. Plavix currently on hold due to thrombocytopenia.  Vascular surgery and general surgery following.  Acute respiratory failure with hypoxia/aspiration pneumonia Patient was on Zosyn and completed treatment on 4/13. Noted to be dyspneic this morning.  Saturations are in the 90s on nasal cannula at 5 L/min. Will repeat chest x-ray.  He does have a history of COPD so some of his dyspnea could be chronic.  Acute metabolic encephalopathy/ICU delirium Was on Precedex in the ICU and currently off of it.  Mentation seems to be stable.  Acute kidney injury/H/o BPH Baseline renal function is normal. Creatinine peaked at 2.17 and has improved back to baseline.   Monitor urine output. Foley catheter is noted.  He does have a history of BPH.  Patient was on Flomax prior to admission which is currently on hold.  Will resume when able to take orally.  Thrombocytopenia Possibly due to sepsis critical illness may be Zosyn.  Zosyn course has been completed.  Antiplatelet agents and heparin currently on hold.  Slight rebounding platelet counts noted today.  Continue to monitor.  No overt bleeding.  Severe protein calorie malnutrition Started on tube feedings but experienced refeeding syndrome. Currently on TPN. Okay for tube feedings per general surgery.  Trickle feed has been started.  Nutritionist is following.  Oropharyngeal dysphagia Noted to be NPO.  Speech therapy following.  Hypernatremia/hypokalemia Sodium levels elevated.  Increase free water. Potassium level has improved.  Normocytic anemia No evidence for bleeding.  Continue to monitor hemoglobin.  Abnormal LFTs Probably due to acute illness.  AST ALT have improved.  History of coronary artery disease status post PCI in the past Stable.  Continue metoprolol.  History of CVA/carotid artery stenosis status post endarterectomy Stable.  History of AAA s/p repair Stable.  History of COPD Continue with the nebulizer treatments.  Follow-up with chest x-ray.  DVT Prophylaxis: SCDs Code Status: Full code Family Communication: Discussed with patient and his wife Disposition Plan: To be determined.  PT and OT eval    Medications: Scheduled:  amLODipine  5 mg Per Tube Daily   arformoterol  15 mcg Nebulization BID   budesonide (PULMICORT) nebulizer solution  0.25 mg Nebulization BID   Chlorhexidine Gluconate Cloth  6 each Topical Daily   feeding supplement (PROSource TF20)  60  mL Per Tube Daily   free water  200 mL Per Tube Q2H   insulin aspart  0-9 Units Subcutaneous Q6H   metoCLOPramide (REGLAN) injection  10 mg Intravenous Once   metoprolol tartrate  25 mg Per Tube BID    mupirocin ointment  1 Application Nasal BID   pantoprazole (PROTONIX) IV  40 mg Intravenous Q24H   QUEtiapine  25 mg Per Tube BID   revefenacin  175 mcg Nebulization Daily   rOPINIRole  1.5 mg Per Tube 2 times per day   And   rOPINIRole  3 mg Per Tube QHS   sodium chloride flush  10-40 mL Intracatheter Q12H   sodium chloride flush  10-40 mL Intracatheter Q12H   sodium chloride flush  3 mL Intravenous Q12H   sodium chloride flush  3 mL Intravenous Q12H   thiamine  100 mg Per Tube Daily   Continuous:  feeding supplement (VITAL 1.5 CAL) 20 mL/hr at 06/26/23 1600   TPN ADULT (ION) 60 mL/hr at 06/26/23 1800   ZOX:[id number], Gerhardt's butt cream, HYDROmorphone (DILAUDID) injection, labetalol, ondansetron (ZOFRAN) IV, mouth rinse, oxyCODONE, sodium chloride flush, sodium chloride flush, sodium chloride flush, sodium chloride flush  Antibiotics: Anti-infectives (From admission, onward)    Start     Dose/Rate Route Frequency Ordered Stop   06/24/23 2200  piperacillin-tazobactam (ZOSYN) IVPB 3.375 g        3.375 g 12.5 mL/hr over 240 Minutes Intravenous Every 12 hours 06/24/23 1013 06/26/23 2359   06/20/23 1000  piperacillin-tazobactam (ZOSYN) IVPB 3.375 g  Status:  Discontinued        3.375 g 12.5 mL/hr over 240 Minutes Intravenous Every 8 hours 06/20/23 0903 06/24/23 1013       Objective:  Vital Signs  Vitals:   06/27/23 0500 06/27/23 0714 06/27/23 0812 06/27/23 0825  BP:  (!) 135/48  (!) 126/55  Pulse:  (!) 116  (!) 107  Resp:  20    Temp:  98.3 F (36.8 C)    TempSrc:  Axillary    SpO2:  93% 97%   Weight: 57.6 kg     Height:        Intake/Output Summary (Last 24 hours) at 06/27/2023 1020 Last data filed at 06/26/2023 2000 Gross per 24 hour  Intake 411.31 ml  Output 1195 ml  Net -783.69 ml   Filed Weights   06/25/23 0454 06/26/23 0400 06/27/23 0500  Weight: 51.8 kg 34.3 kg 57.6 kg    General appearance: Awake alert.  In no distress Resp: Noted to be  tachypneic.  No use of accessory muscles.  Crackles in the right lung.  No wheezing or rhonchi. NG tube is noted Cardio: S1-S2 is normal regular.  No S3-S4.  No rubs murmurs or bruit GI: Abdomen is soft.  Mildly tender diffusely without any rebound rigidity or guarding. Extremities: No edema.  Physical deconditioning as noted Neurologic:No focal neurological deficits.    Lab Results:  Data Reviewed: I have personally reviewed following labs and reports of the imaging studies  CBC: Recent Labs  Lab 06/23/23 0500 06/23/23 0859 06/25/23 0430 06/25/23 1030 06/26/23 0428 06/27/23 0515  WBC 6.5  --  5.7 6.9 7.3 8.6  HGB 12.3* 11.9* 10.0* 10.1* 9.1* 8.7*  HCT 37.3* 35.0* 30.4* 30.5* 27.5* 27.0*  MCV 84.4  --  83.3 84.7 84.4 85.7  PLT 153  --  72* 62* 52* 63*    Basic Metabolic Panel: Recent Labs  Lab 06/24/23 0500 06/24/23 1630 06/25/23  0430 06/25/23 1640 06/26/23 0428 06/26/23 1705 06/27/23 0515  NA 154*  --  156* 152* 155* 153* 155*  K 3.1*  --  2.9* 3.0* 3.2* 3.2* 4.2  CL 121*  --  123* 122* 124* 126* 127*  CO2 22  --  23 25 23  20* 19*  GLUCOSE 179*  --  145* 138* 141* 147* 168*  BUN 97*  --  85* 72* 63* 55* 55*  CREATININE 2.17*  --  1.59* 1.28* 1.21 1.14 1.26*  CALCIUM 8.4*  --  8.3* 7.9* 8.0* 8.0* 7.7*  MG 2.3 2.1 2.2 2.0 1.9 2.5* 2.5*  PHOS 2.8 1.1* 1.2* 3.6  --   --  2.5    GFR: Estimated Creatinine Clearance: 36.8 mL/min (A) (by C-G formula based on SCr of 1.26 mg/dL (H)).  Liver Function Tests: Recent Labs  Lab 06/22/23 1700 06/23/23 0500 06/25/23 0430 06/25/23 1640 06/27/23 0515  AST  --  265* 107*  --  43*  ALT  --  334* 205*  --  80*  ALKPHOS  --  44 35*  --  41  BILITOT  --  1.1 0.7  --  1.1  PROT  --  6.2* 5.1*  --  4.8*  ALBUMIN 1.9* 2.0* 1.7* 1.5* <1.5*   CBG: Recent Labs  Lab 06/26/23 0559 06/26/23 1212 06/26/23 1327 06/27/23 0012 06/27/23 0616  GLUCAP 103* 133* 114* 147* 167*    Lipid Profile: Recent Labs    06/27/23 0515   TRIG 142    Recent Results (from the past 240 hours)  MRSA Next Gen by PCR, Nasal     Status: Abnormal   Collection Time: 06/25/23  9:59 PM   Specimen: Nasal Mucosa; Nasal Swab  Result Value Ref Range Status   MRSA by PCR Next Gen DETECTED (A) NOT DETECTED Final    Comment: RESULT CALLED TO, READ BACK BY AND VERIFIED WITH: B WARNER,RN@[id number]  06/26/23 MK (NOTE) The GeneXpert MRSA Assay (FDA approved for NASAL specimens only), is one component of a comprehensive MRSA colonization surveillance program. It is not intended to diagnose MRSA infection nor to guide or monitor treatment for MRSA infections. Test performance is not FDA approved in patients less than 42 years old. Performed at [facility], [address], [place], [place] [address]       Radiology Studies: No results found.     LOS: 7 days   Jarmal Lewelling [employer] on [email or web address]  06/27/2023, 10:20 AM

## 2023-06-28 ENCOUNTER — Inpatient Hospital Stay (HOSPITAL_COMMUNITY): Admitting: Anesthesiology

## 2023-06-28 ENCOUNTER — Inpatient Hospital Stay (HOSPITAL_COMMUNITY)

## 2023-06-28 ENCOUNTER — Encounter (HOSPITAL_COMMUNITY): Payer: Self-pay

## 2023-06-28 ENCOUNTER — Encounter (HOSPITAL_COMMUNITY)
Admission: EM | Disposition: A | Discharge status: Hospice | Payer: Self-pay | Source: Home / Self Care | Attending: Pulmonary Disease

## 2023-06-28 ENCOUNTER — Other Ambulatory Visit: Payer: Self-pay

## 2023-06-28 DIAGNOSIS — I2510 Atherosclerotic heart disease of native coronary artery without angina pectoris: Secondary | ICD-10-CM | POA: Diagnosis not present

## 2023-06-28 DIAGNOSIS — F17210 Nicotine dependence, cigarettes, uncomplicated: Secondary | ICD-10-CM

## 2023-06-28 DIAGNOSIS — J449 Chronic obstructive pulmonary disease, unspecified: Secondary | ICD-10-CM | POA: Diagnosis not present

## 2023-06-28 DIAGNOSIS — D62 Acute posthemorrhagic anemia: Secondary | ICD-10-CM | POA: Diagnosis not present

## 2023-06-28 DIAGNOSIS — I10 Essential (primary) hypertension: Secondary | ICD-10-CM

## 2023-06-28 DIAGNOSIS — N179 Acute kidney failure, unspecified: Secondary | ICD-10-CM | POA: Diagnosis not present

## 2023-06-28 DIAGNOSIS — E870 Hyperosmolality and hypernatremia: Secondary | ICD-10-CM | POA: Diagnosis not present

## 2023-06-28 DIAGNOSIS — K56609 Unspecified intestinal obstruction, unspecified as to partial versus complete obstruction: Secondary | ICD-10-CM

## 2023-06-28 DIAGNOSIS — D649 Anemia, unspecified: Secondary | ICD-10-CM | POA: Diagnosis not present

## 2023-06-28 DIAGNOSIS — J690 Pneumonitis due to inhalation of food and vomit: Secondary | ICD-10-CM

## 2023-06-28 DIAGNOSIS — T82330A Leakage of aortic (bifurcation) graft (replacement), initial encounter: Secondary | ICD-10-CM

## 2023-06-28 DIAGNOSIS — K449 Diaphragmatic hernia without obstruction or gangrene: Secondary | ICD-10-CM

## 2023-06-28 DIAGNOSIS — R1312 Dysphagia, oropharyngeal phase: Secondary | ICD-10-CM | POA: Diagnosis not present

## 2023-06-28 HISTORY — PX: ABDOMINAL AORTIC ENDOVASCULAR STENT GRAFT: SHX5707

## 2023-06-28 HISTORY — PX: LAPAROTOMY: SHX154

## 2023-06-28 HISTORY — PX: COLECTOMY WITH COLOSTOMY CREATION/HARTMANN PROCEDURE: SHX6598

## 2023-06-28 HISTORY — PX: LAPAROSCOPY: SHX197

## 2023-06-28 HISTORY — PX: AORTOGRAM: SHX6300

## 2023-06-28 LAB — BASIC METABOLIC PANEL WITH GFR
Anion gap: 8 (ref 5–15)
Anion gap: 5 (ref 5–15)
BUN: 58 mg/dL — ABNORMAL HIGH (ref 8–23)
BUN: 62 mg/dL — ABNORMAL HIGH (ref 8–23)
CO2: 17 mmol/L — ABNORMAL LOW (ref 22–32)
CO2: 20 mmol/L — ABNORMAL LOW (ref 22–32)
Calcium: 7.8 mg/dL — ABNORMAL LOW (ref 8.9–10.3)
Calcium: 6.9 mg/dL — ABNORMAL LOW (ref 8.9–10.3)
Chloride: 125 mmol/L — ABNORMAL HIGH (ref 98–111)
Chloride: 125 mmol/L — ABNORMAL HIGH (ref 98–111)
Creatinine, Ser: 1.37 mg/dL — ABNORMAL HIGH (ref 0.61–1.24)
Creatinine, Ser: 1.44 mg/dL — ABNORMAL HIGH (ref 0.61–1.24)
GFR, Estimated: 52 mL/min — ABNORMAL LOW (ref >60)
GFR, Estimated: 49 mL/min — ABNORMAL LOW (ref >60)
Glucose, Bld: 176 mg/dL — ABNORMAL HIGH (ref 70–99)
Glucose, Bld: 166 mg/dL — ABNORMAL HIGH (ref 70–99)
Potassium: 4.4 mmol/L (ref 3.5–5.1)
Potassium: 4.9 mmol/L (ref 3.5–5.1)
Sodium: 150 mmol/L — ABNORMAL HIGH (ref 135–145)
Sodium: 150 mmol/L — ABNORMAL HIGH (ref 135–145)

## 2023-06-28 LAB — POCT I-STAT 7, (LYTES, BLD GAS, ICA,H+H)
Acid-base deficit: 8 mmol/L — ABNORMAL HIGH (ref 0.0–2.0)
Acid-base deficit: 8 mmol/L — ABNORMAL HIGH (ref 0.0–2.0)
Acid-base deficit: 8 mmol/L — ABNORMAL HIGH (ref 0.0–2.0)
Acid-base deficit: 6 mmol/L — ABNORMAL HIGH (ref 0.0–2.0)
Bicarbonate: 19.7 mmol/L — ABNORMAL LOW (ref 20.0–28.0)
Bicarbonate: 19.9 mmol/L — ABNORMAL LOW (ref 20.0–28.0)
Bicarbonate: 19.1 mmol/L — ABNORMAL LOW (ref 20.0–28.0)
Bicarbonate: 21.8 mmol/L (ref 20.0–28.0)
Calcium, Ion: 1.11 mmol/L — ABNORMAL LOW (ref 1.15–1.40)
Calcium, Ion: 1.21 mmol/L (ref 1.15–1.40)
Calcium, Ion: 1.06 mmol/L — ABNORMAL LOW (ref 1.15–1.40)
Calcium, Ion: 1.08 mmol/L — ABNORMAL LOW (ref 1.15–1.40)
HCT: 23 % — ABNORMAL LOW (ref 39.0–52.0)
HCT: 33 % — ABNORMAL LOW (ref 39.0–52.0)
HCT: 25 % — ABNORMAL LOW (ref 39.0–52.0)
HCT: 29 % — ABNORMAL LOW (ref 39.0–52.0)
Hemoglobin: 11.2 g/dL — ABNORMAL LOW (ref 13.0–17.0)
Hemoglobin: 7.8 g/dL — ABNORMAL LOW (ref 13.0–17.0)
Hemoglobin: 8.5 g/dL — ABNORMAL LOW (ref 13.0–17.0)
Hemoglobin: 9.9 g/dL — ABNORMAL LOW (ref 13.0–17.0)
O2 Saturation: 95 %
O2 Saturation: 97 %
O2 Saturation: 93 %
O2 Saturation: 100 %
Patient temperature: 98
Potassium: 4.3 mmol/L (ref 3.5–5.1)
Potassium: 4.8 mmol/L (ref 3.5–5.1)
Potassium: 5.1 mmol/L (ref 3.5–5.1)
Potassium: 4.8 mmol/L (ref 3.5–5.1)
Sodium: 153 mmol/L — ABNORMAL HIGH (ref 135–145)
Sodium: 154 mmol/L — ABNORMAL HIGH (ref 135–145)
Sodium: 152 mmol/L — ABNORMAL HIGH (ref 135–145)
Sodium: 153 mmol/L — ABNORMAL HIGH (ref 135–145)
TCO2: 21 mmol/L — ABNORMAL LOW (ref 22–32)
TCO2: 21 mmol/L — ABNORMAL LOW (ref 22–32)
TCO2: 20 mmol/L — ABNORMAL LOW (ref 22–32)
TCO2: 23 mmol/L (ref 22–32)
pCO2 arterial: 47.5 mmHg (ref 32–48)
pCO2 arterial: 51.5 mmHg — ABNORMAL HIGH (ref 32–48)
pCO2 arterial: 45.2 mmHg (ref 32–48)
pCO2 arterial: 55.6 mmHg — ABNORMAL HIGH (ref 32–48)
pH, Arterial: 7.194 — CL (ref 7.35–7.45)
pH, Arterial: 7.224 — ABNORMAL LOW (ref 7.35–7.45)
pH, Arterial: 7.233 — ABNORMAL LOW (ref 7.35–7.45)
pH, Arterial: 7.199 — CL (ref 7.35–7.45)
pO2, Arterial: 115 mmHg — ABNORMAL HIGH (ref 83–108)
pO2, Arterial: 91 mmHg (ref 83–108)
pO2, Arterial: 78 mmHg — ABNORMAL LOW (ref 83–108)
pO2, Arterial: 334 mmHg — ABNORMAL HIGH (ref 83–108)

## 2023-06-28 LAB — IRON AND TIBC
Iron: 9 ug/dL — ABNORMAL LOW (ref 45–182)
Saturation Ratios: 5 % — ABNORMAL LOW (ref 17.9–39.5)
TIBC: 171 ug/dL — ABNORMAL LOW (ref 250–450)
UIBC: 162 ug/dL

## 2023-06-28 LAB — PREPARE RBC (CROSSMATCH)

## 2023-06-28 LAB — DIC (DISSEMINATED INTRAVASCULAR COAGULATION)PANEL
D-Dimer, Quant: >20 ug{FEU}/mL — ABNORMAL HIGH (ref 0.00–0.50)
Fibrinogen: 564 mg/dL — ABNORMAL HIGH (ref 210–475)
INR: 1.4 — ABNORMAL HIGH (ref 0.8–1.2)
Platelets: 88 10*3/uL — ABNORMAL LOW (ref 150–400)
Prothrombin Time: 17.4 s — ABNORMAL HIGH (ref 11.4–15.2)
Smear Review: NONE SEEN
aPTT: 38 s — ABNORMAL HIGH (ref 24–36)

## 2023-06-28 LAB — MAGNESIUM
Magnesium: 2.3 mg/dL (ref 1.7–2.4)
Magnesium: 2.2 mg/dL (ref 1.7–2.4)

## 2023-06-28 LAB — CBC
HCT: 25.5 % — ABNORMAL LOW (ref 39.0–52.0)
HCT: 30.9 % — ABNORMAL LOW (ref 39.0–52.0)
Hemoglobin: 8.4 g/dL — ABNORMAL LOW (ref 13.0–17.0)
Hemoglobin: 10 g/dL — ABNORMAL LOW (ref 13.0–17.0)
MCH: 28.4 pg (ref 26.0–34.0)
MCH: 28.4 pg (ref 26.0–34.0)
MCHC: 32.9 g/dL (ref 30.0–36.0)
MCHC: 32.4 g/dL (ref 30.0–36.0)
MCV: 86.1 fL (ref 80.0–100.0)
MCV: 87.8 fL (ref 80.0–100.0)
Platelets: 79 K/uL — ABNORMAL LOW (ref 150–400)
Platelets: 84 10*3/uL — ABNORMAL LOW (ref 150–400)
RBC: 2.96 MIL/uL — ABNORMAL LOW (ref 4.22–5.81)
RBC: 3.52 MIL/uL — ABNORMAL LOW (ref 4.22–5.81)
RDW: 17.1 % — ABNORMAL HIGH (ref 11.5–15.5)
RDW: 17.2 % — ABNORMAL HIGH (ref 11.5–15.5)
WBC: 11.8 K/uL — ABNORMAL HIGH (ref 4.0–10.5)
WBC: 9.6 10*3/uL (ref 4.0–10.5)
nRBC: 0 % (ref 0.0–0.2)
nRBC: 0 % (ref 0.0–0.2)

## 2023-06-28 LAB — MRSA NEXT GEN BY PCR, NASAL: MRSA by PCR Next Gen: DETECTED — AB

## 2023-06-28 LAB — POCT ACTIVATED CLOTTING TIME
Activated Clotting Time: 187 s
Activated Clotting Time: 153 s

## 2023-06-28 LAB — GLUCOSE, CAPILLARY
Glucose-Capillary: 142 mg/dL — ABNORMAL HIGH (ref 70–99)
Glucose-Capillary: 167 mg/dL — ABNORMAL HIGH (ref 70–99)
Glucose-Capillary: 167 mg/dL — ABNORMAL HIGH (ref 70–99)
Glucose-Capillary: 183 mg/dL — ABNORMAL HIGH (ref 70–99)
Glucose-Capillary: 191 mg/dL — ABNORMAL HIGH (ref 70–99)

## 2023-06-28 LAB — LACTIC ACID, PLASMA
Lactic Acid, Venous: 1.4 mmol/L (ref 0.5–1.9)
Lactic Acid, Venous: 1 mmol/L (ref 0.5–1.9)
Lactic Acid, Venous: 1.3 mmol/L (ref 0.5–1.9)

## 2023-06-28 LAB — FOLATE: Folate: 8.3 ng/mL (ref >5.9)

## 2023-06-28 LAB — RETICULOCYTES
Immature Retic Fract: 26 % — ABNORMAL HIGH (ref 2.3–15.9)
RBC.: 2.96 MIL/uL — ABNORMAL LOW (ref 4.22–5.81)
Retic Count, Absolute: 16.3 K/uL — ABNORMAL LOW (ref 19.0–186.0)
Retic Ct Pct: 0.6 % (ref 0.4–3.1)

## 2023-06-28 LAB — VITAMIN B12: Vitamin B-12: 4086 pg/mL — ABNORMAL HIGH (ref 180–914)

## 2023-06-28 LAB — PROCALCITONIN: Procalcitonin: 7.28 ng/mL

## 2023-06-28 LAB — PHOSPHORUS: Phosphorus: 2.8 mg/dL (ref 2.5–4.6)

## 2023-06-28 LAB — FERRITIN: Ferritin: 313 ng/mL (ref 24–336)

## 2023-06-28 SURGERY — AORTOGRAM
Anesthesia: General | Site: Abdomen

## 2023-06-28 MED ORDER — DOCUSATE SODIUM 50 MG/5ML PO LIQD
100.0000 mg | Freq: Two times a day (BID) | ORAL | Status: DC
  Administered 2023-06-28 – 2023-06-30 (×3): 100 mg
  Filled 2023-06-28 (×3): qty 10

## 2023-06-28 MED ORDER — SODIUM BICARBONATE 8.4 % IV SOLN
INTRAVENOUS | Status: AC
  Filled 2023-06-28 (×2): qty 150

## 2023-06-28 MED ORDER — FENTANYL CITRATE (PF) 250 MCG/5ML IJ SOLN
INTRAMUSCULAR | Status: DC | PRN
  Administered 2023-06-28 (×2): 50 ug via INTRAVENOUS

## 2023-06-28 MED ORDER — HEPARIN 6000 UNIT IRRIGATION SOLUTION
Status: AC
  Filled 2023-06-28: qty 500

## 2023-06-28 MED ORDER — PROPOFOL 10 MG/ML IV BOLUS
INTRAVENOUS | Status: DC | PRN
  Administered 2023-06-28: 70 mg via INTRAVENOUS

## 2023-06-28 MED ORDER — ACETAMINOPHEN 10 MG/ML IV SOLN
1000.0000 mg | Freq: Four times a day (QID) | INTRAVENOUS | Status: AC
  Administered 2023-06-28 – 2023-06-29 (×4): 1000 mg via INTRAVENOUS
  Filled 2023-06-28 (×4): qty 100

## 2023-06-28 MED ORDER — SODIUM BICARBONATE 8.4 % IV SOLN
INTRAVENOUS | Status: DC | PRN
  Administered 2023-06-28 (×2): 50 meq via INTRAVENOUS

## 2023-06-28 MED ORDER — VASOPRESSIN 20 UNITS/100 ML INFUSION FOR SHOCK
0.0400 [IU]/min | INTRAVENOUS | Status: DC
  Administered 2023-06-28 – 2023-06-30 (×5): 0.04 [IU]/min via INTRAVENOUS
  Filled 2023-06-28 (×5): qty 100

## 2023-06-28 MED ORDER — PHENYLEPHRINE HCL-NACL 20-0.9 MG/250ML-% IV SOLN
INTRAVENOUS | Status: DC | PRN
  Administered 2023-06-28: 80 ug/min via INTRAVENOUS

## 2023-06-28 MED ORDER — PIPERACILLIN-TAZOBACTAM 3.375 G IVPB: 3.3750 g | Freq: Two times a day (BID) | INTRAVENOUS | Status: DC

## 2023-06-28 MED ORDER — ORAL CARE MOUTH RINSE
15.0000 mL | Freq: Once | OROMUCOSAL | Status: DC
Start: 2023-06-28 — End: 2023-06-28

## 2023-06-28 MED ORDER — ORAL CARE MOUTH RINSE: 15.0000 mL | OROMUCOSAL | Status: DC | PRN

## 2023-06-28 MED ORDER — TRAVASOL 10 % IV SOLN
INTRAVENOUS | Status: AC
  Filled 2023-06-28: qty 806.4

## 2023-06-28 MED ORDER — ROCURONIUM BROMIDE 10 MG/ML (PF) SYRINGE
PREFILLED_SYRINGE | INTRAVENOUS | Status: AC
  Filled 2023-06-28: qty 10

## 2023-06-28 MED ORDER — POLYETHYLENE GLYCOL 3350 17 G PO PACK
17.0000 g | PACK | Freq: Every day | ORAL | Status: DC
Start: 2023-06-28 — End: 2023-07-01
  Administered 2023-06-30: 17 g
  Filled 2023-06-28: qty 1

## 2023-06-28 MED ORDER — SODIUM CHLORIDE 0.9 % IV BOLUS
500.0000 mL | Freq: Once | INTRAVENOUS | Status: AC
  Administered 2023-06-28: 500 mL via INTRAVENOUS

## 2023-06-28 MED ORDER — PROTAMINE SULFATE 10 MG/ML IV SOLN
INTRAVENOUS | Status: DC | PRN
  Administered 2023-06-28 (×2): 30 mg via INTRAVENOUS

## 2023-06-28 MED ORDER — INSULIN ASPART 100 UNIT/ML IJ SOLN
0.0000 [IU] | INTRAMUSCULAR | Status: DC
  Administered 2023-06-28 – 2023-06-29 (×2): 2 [IU] via SUBCUTANEOUS
  Administered 2023-06-29: 1 [IU] via SUBCUTANEOUS
  Administered 2023-06-29: 2 [IU] via SUBCUTANEOUS
  Administered 2023-06-29 (×2): 3 [IU] via SUBCUTANEOUS
  Administered 2023-06-30 (×4): 1 [IU] via SUBCUTANEOUS
  Administered 2023-06-30 – 2023-07-01 (×2): 2 [IU] via SUBCUTANEOUS
  Administered 2023-07-02 – 2023-07-03 (×5): 1 [IU] via SUBCUTANEOUS

## 2023-06-28 MED ORDER — ROCURONIUM BROMIDE 10 MG/ML (PF) SYRINGE
PREFILLED_SYRINGE | INTRAVENOUS | Status: DC | PRN
  Administered 2023-06-28: 30 mg via INTRAVENOUS
  Administered 2023-06-28: 20 mg via INTRAVENOUS

## 2023-06-28 MED ORDER — FENTANYL CITRATE PF 50 MCG/ML IJ SOSY
50.0000 ug | PREFILLED_SYRINGE | Freq: Once | INTRAMUSCULAR | Status: AC
  Administered 2023-06-28: 50 ug via INTRAVENOUS

## 2023-06-28 MED ORDER — SODIUM CHLORIDE 0.9 % IV SOLN: INTRAVENOUS | Status: DC

## 2023-06-28 MED ORDER — FENTANYL 2500MCG IN NS 250ML (10MCG/ML) PREMIX INFUSION
50.0000 ug/h | INTRAVENOUS | Status: DC
  Administered 2023-06-28: 50 ug/h via INTRAVENOUS
  Administered 2023-06-30: 75 ug/h via INTRAVENOUS
  Filled 2023-06-28 (×2): qty 250

## 2023-06-28 MED ORDER — 0.9 % SODIUM CHLORIDE (POUR BTL) OPTIME
TOPICAL | Status: DC | PRN
  Administered 2023-06-28: 2000 mL

## 2023-06-28 MED ORDER — PROPOFOL 500 MG/50ML IV EMUL
INTRAVENOUS | Status: DC | PRN
  Administered 2023-06-28: 40 ug/kg/min via INTRAVENOUS

## 2023-06-28 MED ORDER — NOREPINEPHRINE 4 MG/250ML-% IV SOLN
INTRAVENOUS | Status: DC | PRN
Start: 2023-06-28 — End: 2023-06-28
  Administered 2023-06-28: 2 ug/min via INTRAVENOUS

## 2023-06-28 MED ORDER — VITAL 1.5 CAL PO LIQD
1000.0000 mL | ORAL | Status: DC
  Filled 2023-06-28: qty 1000

## 2023-06-28 MED ORDER — PROPOFOL 10 MG/ML IV BOLUS
INTRAVENOUS | Status: AC
  Filled 2023-06-28: qty 20

## 2023-06-28 MED ORDER — SODIUM CHLORIDE 0.9 % IV SOLN
INTRAVENOUS | Status: AC | PRN
  Administered 2023-06-28: 1000 mL via INTRAMUSCULAR

## 2023-06-28 MED ORDER — LIDOCAINE 2% (20 MG/ML) 5 ML SYRINGE
INTRAMUSCULAR | Status: AC
  Filled 2023-06-28: qty 5

## 2023-06-28 MED ORDER — LIDOCAINE 2% (20 MG/ML) 5 ML SYRINGE
INTRAMUSCULAR | Status: DC | PRN
  Administered 2023-06-28: 40 mg via INTRAVENOUS

## 2023-06-28 MED ORDER — CHLORHEXIDINE GLUCONATE 0.12 % MT SOLN
OROMUCOSAL | Status: AC
Start: 2023-06-28 — End: 2023-06-28
  Filled 2023-06-28: qty 15

## 2023-06-28 MED ORDER — CHLORHEXIDINE GLUCONATE 0.12 % MT SOLN: 15.0000 mL | Freq: Once | OROMUCOSAL | Status: DC

## 2023-06-28 MED ORDER — SODIUM CHLORIDE 0.9 % IV BOLUS
500.0000 mL | Freq: Once | INTRAVENOUS | Status: AC
Start: 2023-06-28 — End: 2023-06-28
  Administered 2023-06-28: 500 mL via INTRAVENOUS

## 2023-06-28 MED ORDER — LACTATED RINGERS IV SOLN: INTRAVENOUS | Status: DC

## 2023-06-28 MED ORDER — SUCCINYLCHOLINE CHLORIDE 200 MG/10ML IV SOSY
PREFILLED_SYRINGE | INTRAVENOUS | Status: DC | PRN
  Administered 2023-06-28: 140 mg via INTRAVENOUS

## 2023-06-28 MED ORDER — HEPARIN SODIUM (PORCINE) 1000 UNIT/ML IJ SOLN
INTRAMUSCULAR | Status: DC | PRN
  Administered 2023-06-28: 2000 [IU] via INTRAVENOUS
  Administered 2023-06-28: 5000 [IU] via INTRAVENOUS

## 2023-06-28 MED ORDER — PROPOFOL 1000 MG/100ML IV EMUL
0.0000 ug/kg/min | INTRAVENOUS | Status: DC
  Administered 2023-06-28 (×2): 40 ug/kg/min via INTRAVENOUS
  Administered 2023-06-29: 35 ug/kg/min via INTRAVENOUS
  Administered 2023-06-29 – 2023-06-30 (×2): 25 ug/kg/min via INTRAVENOUS
  Administered 2023-06-30: 35 ug/kg/min via INTRAVENOUS
  Filled 2023-06-28 (×7): qty 100

## 2023-06-28 MED ORDER — FENTANYL CITRATE (PF) 250 MCG/5ML IJ SOLN
INTRAMUSCULAR | Status: AC
  Filled 2023-06-28: qty 5

## 2023-06-28 MED ORDER — PHENYLEPHRINE HCL-NACL 20-0.9 MG/250ML-% IV SOLN
0.0000 ug/min | INTRAVENOUS | Status: DC
  Administered 2023-06-28: 60 ug/min via INTRAVENOUS

## 2023-06-28 MED ORDER — IODIXANOL 320 MG/ML IV SOLN
INTRAVENOUS | Status: DC | PRN
  Administered 2023-06-28: 107 mL

## 2023-06-28 MED ORDER — NOREPINEPHRINE 4 MG/250ML-% IV SOLN
0.0000 ug/min | INTRAVENOUS | Status: DC
  Administered 2023-06-28: 6 ug/min via INTRAVENOUS
  Administered 2023-06-29: 3 ug/min via INTRAVENOUS
  Administered 2023-06-30: 2 ug/min via INTRAVENOUS
  Filled 2023-06-28 (×2): qty 250

## 2023-06-28 MED ORDER — ORAL CARE MOUTH RINSE
15.0000 mL | OROMUCOSAL | Status: DC
  Administered 2023-06-28 – 2023-07-01 (×33): 15 mL via OROMUCOSAL

## 2023-06-28 MED ORDER — LACTATED RINGERS IV SOLN
INTRAVENOUS | Status: DC | PRN
Start: 2023-06-28 — End: 2023-06-28

## 2023-06-28 MED ORDER — ALBUMIN HUMAN 5 % IV SOLN: INTRAVENOUS | Status: DC | PRN

## 2023-06-28 MED ORDER — ALBUMIN HUMAN 25 % IV SOLN
50.0000 g | Freq: Four times a day (QID) | INTRAVENOUS | Status: AC
  Administered 2023-06-28: 100 g via INTRAVENOUS
  Administered 2023-06-29: 50 g via INTRAVENOUS
  Filled 2023-06-28 (×2): qty 200

## 2023-06-28 MED ORDER — EPHEDRINE SULFATE-NACL 50-0.9 MG/10ML-% IV SOSY
PREFILLED_SYRINGE | INTRAVENOUS | Status: DC | PRN
Start: 2023-06-28 — End: 2023-06-28
  Administered 2023-06-28: 15 mg via INTRAVENOUS

## 2023-06-28 MED ORDER — LIDOCAINE HCL (PF) 1 % IJ SOLN
INTRAMUSCULAR | Status: AC
  Filled 2023-06-28: qty 30

## 2023-06-28 MED ORDER — FENTANYL BOLUS VIA INFUSION
50.0000 ug | INTRAVENOUS | Status: DC | PRN
  Administered 2023-06-29 (×2): 25 ug via INTRAVENOUS
  Administered 2023-06-29 (×2): 50 ug via INTRAVENOUS

## 2023-06-28 MED ORDER — IOHEXOL 350 MG/ML SOLN
75.0000 mL | Freq: Once | INTRAVENOUS | Status: AC | PRN
  Administered 2023-06-28: 75 mL via INTRAVENOUS

## 2023-06-28 MED ORDER — PIPERACILLIN-TAZOBACTAM 3.375 G IVPB
3.3750 g | Freq: Three times a day (TID) | INTRAVENOUS | Status: DC
  Administered 2023-06-28 – 2023-07-03 (×15): 3.375 g via INTRAVENOUS
  Filled 2023-06-28 (×14): qty 50

## 2023-06-28 SURGICAL SUPPLY — 77 items
ANTIFOG SOL W/FOAM PAD STRL (MISCELLANEOUS) ×3 IMPLANT
BAG COUNTER SPONGE SURGICOUNT (BAG) ×3 IMPLANT
BNDG GAUZE DERMACEA FLUFF 4 (GAUZE/BANDAGES/DRESSINGS) IMPLANT
CANISTER SUCT 3000ML PPV (MISCELLANEOUS) ×3 IMPLANT
CATH OMNI FLUSH .035X70CM (CATHETERS) IMPLANT
CATH OMNI FLUSH 5F 65CM (CATHETERS) ×3 IMPLANT
CATH QUICKCROSS SUPP .035X90CM (MICROCATHETER) IMPLANT
CHLORAPREP W/TINT 26 (MISCELLANEOUS) ×3 IMPLANT
CLIP TI MEDIUM 6 (CLIP) ×3 IMPLANT
CLIP TI WIDE RED SMALL 6 (CLIP) ×3 IMPLANT
CLOSURE PERCLOSE PROSTYLE (VASCULAR PRODUCTS) IMPLANT
COVER BACK TABLE 80X110 HD (DRAPES) ×6 IMPLANT
COVER PROBE W GEL 5X96 (DRAPES) ×3 IMPLANT
DERMABOND ADVANCED .7 DNX12 (GAUZE/BANDAGES/DRESSINGS) ×3 IMPLANT
DEVICE TORQUE KENDALL .025-038 (MISCELLANEOUS) IMPLANT
DEVICE TORQUE SEADRAGON GRN (MISCELLANEOUS) IMPLANT
ELECT BLADE 4.0 EZ CLEAN MEGAD (MISCELLANEOUS) ×3 IMPLANT
ELECTRODE BLDE 4.0 EZ CLN MEGD (MISCELLANEOUS) IMPLANT
EXCLDR EXT ENDO 28X4.5 16F (Endovascular Graft) ×3 IMPLANT
EXCLUDER EXT ENDO 28X4.5 16F (Endovascular Graft) IMPLANT
FILTER CO2 0.2 MICRON (VASCULAR PRODUCTS) IMPLANT
FILTER CO2 INSUFFLATOR AX1008 (MISCELLANEOUS) IMPLANT
GAUZE PAD ABD 8X10 STRL (GAUZE/BANDAGES/DRESSINGS) IMPLANT
GAUZE SPONGE 4X4 12PLY STRL (GAUZE/BANDAGES/DRESSINGS) IMPLANT
GLOVE BIOGEL PI IND STRL 7.0 (GLOVE) ×3 IMPLANT
GOWN STRL REUS W/ TWL LRG LVL3 (GOWN DISPOSABLE) ×6 IMPLANT
GOWN STRL REUS W/ TWL XL LVL3 (GOWN DISPOSABLE) ×3 IMPLANT
GRAFT BALLN CATH 65CM (BALLOONS) IMPLANT
GUIDEWIRE ANGLED .035X150CM (WIRE) IMPLANT
GUIDEWIRE ANGLED .035X260CM (WIRE) IMPLANT
HANDLE SUCTION POOLE (INSTRUMENTS) IMPLANT
KIT BASIN OR (CUSTOM PROCEDURE TRAY) IMPLANT
KIT ENCORE 26 ADVANTAGE (KITS) IMPLANT
KIT OSTOMY DRAINABLE 2.75 STR (WOUND CARE) IMPLANT
KIT TURNOVER KIT B (KITS) ×3 IMPLANT
LIGASURE IMPACT 36 18CM CVD LR (INSTRUMENTS) IMPLANT
NDL INSUFFLATION 14GA 120MM (NEEDLE) IMPLANT
NDL PERC 18GX7CM (NEEDLE) ×3 IMPLANT
NEEDLE INSUFFLATION 14GA 120MM (NEEDLE) ×3 IMPLANT
NEEDLE PERC 18GX7CM (NEEDLE) IMPLANT
NS IRRIG 1000ML POUR BTL (IV SOLUTION) IMPLANT
PACK ENDO MINOR (CUSTOM PROCEDURE TRAY) ×3 IMPLANT
PACK LAPAROSCOPY BASIN (CUSTOM PROCEDURE TRAY) IMPLANT
PAD ARMBOARD POSITIONER FOAM (MISCELLANEOUS) ×6 IMPLANT
PENCIL SMOKE EVACUATOR (MISCELLANEOUS) IMPLANT
POWDER SURGICEL 3.0 GRAM (HEMOSTASIS) IMPLANT
PROTECTION STATION PRESSURIZED (MISCELLANEOUS) ×3 IMPLANT
RELOAD PROXIMATE 75MM BLUE (ENDOMECHANICALS) ×3 IMPLANT
RELOAD STAPLE 75 3.8 BLU REG (ENDOMECHANICALS) IMPLANT
SET FLUSH CO2 (MISCELLANEOUS) IMPLANT
SET MICROPUNCTURE 5F STIFF (MISCELLANEOUS) IMPLANT
SHEATH APTUS 6.5FR 45CM (SHEATH) IMPLANT
SHEATH DRYSEAL FLEX 16FR 33CM (SHEATH) IMPLANT
SHEATH PINNACLE 5F 10CM (SHEATH) IMPLANT
SOLUTION ANTFG W/FOAM PAD STRL (MISCELLANEOUS) IMPLANT
SPONGE T-LAP 18X18 ~~LOC~~+RFID (SPONGE) IMPLANT
STAPLER PROXIMATE 75MM BLUE (STAPLE) IMPLANT
STATION PROTECTION PRESSURIZED (MISCELLANEOUS) IMPLANT
STENT VIABAHN 6X19 6FR 135 (Permanent Stent) IMPLANT
STOPCOCK MORSE 400PSI 3WAY (MISCELLANEOUS) ×3 IMPLANT
SUCTION POOLE HANDLE (INSTRUMENTS) ×3 IMPLANT
SUT PDS AB 1 TP1 96 (SUTURE) IMPLANT
SUT SILK 2 0SH CR/8 30 (SUTURE) IMPLANT
SUT SILK 3 0 SH CR/8 (SUTURE) IMPLANT
SUT VIC AB 3-0 SH 8-18 (SUTURE) IMPLANT
SYR MEDRAD MARK V 150ML (SYRINGE) IMPLANT
SYS ACCESS ABD 5X75MM KII FIOS (TROCAR) IMPLANT
TOWEL GREEN STERILE (TOWEL DISPOSABLE) ×6 IMPLANT
TRAY FOLEY SLVR 16FR TEMP STAT (SET/KITS/TRAYS/PACK) IMPLANT
TROCAR XCEL NON-BLD 5MMX100MML (ENDOMECHANICALS) IMPLANT
TUBING HIGH PRESSURE 120CM (CONNECTOR) IMPLANT
TUBING INJECTOR 48 (MISCELLANEOUS) IMPLANT
TUBING LAP HI FLOW INSUFFLATIO (TUBING) IMPLANT
WATER STERILE IRR 1000ML POUR (IV SOLUTION) IMPLANT
WIRE BENTSON .035X145CM (WIRE) ×3 IMPLANT
WIRE ROSEN-J .035X260CM (WIRE) IMPLANT
WIRE TORQFLEX AUST .018X40CM (WIRE) IMPLANT

## 2023-06-28 NOTE — Progress Notes (Addendum)
  Daily Progress Note  S/p: SMA stenting for acute mesenteric ischemia  Subjective: Continues to have abdominal pain, peritonitic today  Objective: Vitals:   06/28/23 1100 06/28/23 1226  BP: (!) 93/37 (!) 101/43  Pulse: 81 84  Resp: (!) 23 (!) 24  Temp: 99.3 F (37.4 C) 98.1 F (36.7 C)  SpO2: 95%     Physical Examination Severe abdominal pain with palpation Nonlabored breathing Alert and oriented   ASSESSMENT/PLAN:  Patient is an 83 year old male with history of SMA stent 8 days ago for acute mesenteric ischemia.  Prior history includes Z-fen device placed by Dr. Myles [name] at [facility] in 2017.  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  No images were completed today due to continued abdominal pain.  If this demonstrates a 2 cm increase in size from last week, with endoleak.  I have looked at the images with my partners, concern for 1A versus 1C.  With the rapid expansion, Erik [name] would be best served with diagnostic angiography in an effort to define, and hopefully treat the endoleak.  If the endoleak is a Ia, this becomes much more complicated due to his previous repair.  He would likely require transfer to [facility] for new graft.  Due to peritonitis appreciated on exam, and history of acute mesenteric ischemia, I discussed his case with Dr. Alethea [name], who also evaluated the patient and recommended diagnostic laparoscopy to assess the bowel at time of surgery.  After discussing the risks and benefits with Erik [name], and his family members, he elected to proceed. 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 Family is aware that if this is the case, it will be a terminal diagnosis.    Erik Part MD MS [facility] [phone] 06/28/2023  1:30 PM

## 2023-06-28 NOTE — Progress Notes (Signed)
 Nutrition Follow-up  DOCUMENTATION CODES:   Underweight, Severe malnutrition in context of chronic illness  INTERVENTION:   TPN management per pharmacy  TPN to meet 100% of nutritional needs now since tube feeds are contraindicated for patient at this time TPN to meet higher end of patients nutritional needs, 1800 kcal and 95 gm of protein Continue MVI with minerals through TPN   Monitor appropriateness for initiation of enteral nutrition  NUTRITION DIAGNOSIS:   Severe Malnutrition related to chronic illness (CHF) as evidenced by severe muscle depletion, severe fat depletion.  - Still applicable   GOAL:   Patient will meet greater than or equal to 90% of their needs  - Meeting via TPN   MONITOR:   I & O's, Vent status, Labs, Weight trends  REASON FOR ASSESSMENT:   Consult Enteral/tube feeding initiation and management  ASSESSMENT:   Pt with hx of CAD, HLD, HTN, CHF, and GERD presented to ED with abdominal pain that has been ongoing for months but significantly worse on the day of admission.  4/6 - presented to ED 4/7 - OR, s/p dx laparoscopy, stent placed in SMA by vascular surgery, left intubated after surgery 4/8 - extubated 4/9 - increased delirium and WOB, on BiPAP 4/10 - TPN to start tonight  4/11 - Cortrak placed post pyloric  4/13 - Trickle tube feeds started  4/14 - Tube feeds titrating to goal  4/15 - Tube feeds at goal then tube feeds held due to worsening abdominal pain, hemodynamic instability, OR today for diagnostic lap, ex lap, possible bowel resection, and possible ostomy.   Patient with TPN running, has been meeting 100% of nutritional needs. Cortrak placed and trickle tube feeds started 4/13. Patient was tolerating with no N/V and having bowel movements. Was refeeding, pharmacy added electrolytes to TPN, lytes were stable. Tube feeds advanced to goal today.  Plan was to wean TPN once patient was tolerating TF's at goal however, today patient  developed worsening abdominal pain that. Per surgery bowl did not appear ischemic on diagnostic lap however they are concerned with his worsening abdominal pain and patients hemodynamic changes. Tube feeds on hold, OR later today for diagnostic lap, ex lap, possible bowel resection, and possible ostomy.    TPN running at 60 ml/hr providing 1527 kcal and 81 gm of protein per day. Messaged pharmacy to increase TPN to meet closer to 1800 kcal and 95 gm of protein for patient since he is undergoing surgery today.   Admit weight: 50.8 kg  Current weight: 56.4 kg    Nutritionally Relevant Medications: Scheduled Meds:  [MAR Hold] arformoterol  15 mcg Nebulization BID   [MAR Hold] budesonide (PULMICORT) nebulizer solution  0.25 mg Nebulization BID   chlorhexidine  15 mL Mouth/Throat Once   Or   mouth rinse  15 mL Mouth Rinse Once   chlorhexidine       [MAR Hold] Chlorhexidine Gluconate Cloth  6 each Topical Daily   [MAR Hold] feeding supplement (PROSource TF20)  60 mL Per Tube Daily   [MAR Hold] free water  200 mL Per Tube Q2H   [MAR Hold] insulin aspart  0-9 Units Subcutaneous Q6H   [MAR Hold] mupirocin ointment  1 Application Nasal BID   [MAR Hold] pantoprazole (PROTONIX) IV  40 mg Intravenous Q24H   [MAR Hold] QUEtiapine  25 mg Per Tube BID   [MAR Hold] revefenacin  175 mcg Nebulization Daily   [MAR Hold] rOPINIRole  1.5 mg Per Tube 2 times per day  And   [MAR Hold] rOPINIRole  3 mg Per Tube QHS   [MAR Hold] sodium chloride flush  10-40 mL Intracatheter Q12H   [MAR Hold] sodium chloride flush  3 mL Intravenous Q12H   [MAR Hold] thiamine  100 mg Per Tube Daily   Continuous Infusions:  sodium chloride 75 mL/hr at 06/28/23 1303   sodium chloride     lactated ringers     [MAR Hold] piperacillin-tazobactam (ZOSYN)  IV 3.375 g (06/28/23 1112)   TPN ADULT (ION) 60 mL/hr at 06/28/23 1000   TPN ADULT (ION)     Labs Reviewed: Sodium 150, Chloride 125, BUN 58, Creatinine 1.37, Calcium 7.8,   Albumin <1.5, AST 43, ALT 80, total protein 4.8, CRP 7, Hgb 8.4  CBG ranges from 125-183 mg/dL over the last 24 hours HgbA1c 5.6  Diet Order:   Diet Order             Diet NPO time specified  Diet effective now                   EDUCATION NEEDS:   Not appropriate for education at this time  Skin:  Skin Assessment: Skin Integrity Issues: Skin Integrity Issues:: Incisions, DTI DTI: Nose Incisions: Abdomen  Last BM:  4/10 - type 6 and 7 (3x today)  Height:   Ht Readings from Last 1 Encounters:  06/19/23 5\' 6"  (1.676 m)    Weight:   Wt Readings from Last 1 Encounters:  06/28/23 56.4 kg    Ideal Body Weight:  64.5 kg  BMI:  Body mass index is 20.07 kg/m.  Estimated Nutritional Needs:   Kcal:  1500-1800 kcal/d  Protein:  80-95g/d  Fluid:  1.8L/d   Frederik Jansky, RD Registered Dietitian  See Amion for more information

## 2023-06-28 NOTE — Anesthesia Preprocedure Evaluation (Addendum)
 Anesthesia Evaluation  Patient identified by MRN, date of birth, ID band Patient awake    Reviewed: Allergy & Precautions, NPO status , Patient's Chart, lab work & pertinent test results  History of Anesthesia Complications Negative for: history of anesthetic complications  Airway Mallampati: III  TM Distance: >3 FB Neck ROM: Full    Dental  (+) Dental Advisory Given   Pulmonary COPD, Current Smoker and Patient abstained from smoking.    + decreased breath sounds      Cardiovascular hypertension, Pt. on medications and Pt. on home beta blockers + CAD, + Past MI, + Peripheral Vascular Disease and +CHF   Rhythm:Regular Rate:Tachycardia  ECHO IMPRESSIONS   1. Left ventricular ejection fraction, by estimation, is 50 to 55%. The left ventricle has low normal function. The left ventricle has no regional wall motion abnormalities. There is mild left ventricular hypertrophy. Left ventricular diastolic parameters are indeterminate.  2. Right ventricular systolic function is normal. The right ventricular size is normal.  3. Left atrial size was moderately dilated.  4. Right atrial size was mildly dilated.  5. The mitral valve is degenerative. Trivial mitral valve regurgitation. Moderate mitral stenosis. The mean mitral valve gradient is 9.5 mmHg. Severe mitral annular calcification.  6. The aortic valve is tricuspid. There is severe calcifcation of the aortic valve. Aortic valve regurgitation is severe. Mild aortic valve stenosis. Aortic valve area, by VTI measures 1.51 cm. Aortic valve mean gradient measures 10.0 mmHg. Aortic valve Vmax measures 2.14 m/s.  7. The inferior vena cava is normal in size with greater than 50% respiratory variability, suggesting right atrial pressure of 3 mmH    Neuro/Psych negative neurological ROS  negative psych ROS   GI/Hepatic Neg liver ROS,GERD  ,,  Endo/Other  negative endocrine ROS     Renal/GU negative Renal ROS  negative genitourinary   Musculoskeletal negative musculoskeletal ROS (+)    Abdominal   Peds  Hematology  (+) Blood dyscrasia, anemia Lab Results      Component                Value               Date                      WBC                      11.8 (H)            06/28/2023                HGB                      8.4 (L)             06/28/2023                HCT                      25.5 (L)            06/28/2023                MCV                      86.1                06/28/2023  PLT                      79 (L)              06/28/2023             Lab Results      Component                Value               Date                      NA                       150 (H)             06/28/2023                K                        4.4                 06/28/2023                CO2                      17 (L)              06/28/2023                GLUCOSE                  176 (H)             06/28/2023                BUN                      58 (H)              06/28/2023                CREATININE               1.37 (H)            06/28/2023                CALCIUM                  7.8 (L)             06/28/2023                GFR                      70.75               08/19/2022                GFRNONAA                 52 (L)              06/28/2023              Anesthesia Other Findings   Reproductive/Obstetrics                             Anesthesia  Physical Anesthesia Plan  ASA: 3 and emergent  Anesthesia Plan: General   Post-op Pain Management:    Induction: Intravenous, Rapid sequence and Cricoid pressure planned  PONV Risk Score and Plan: 1 and Ondansetron, Dexamethasone and Treatment may vary due to age or medical condition  Airway Management Planned: Mask and Oral ETT  Additional Equipment: Arterial line  Intra-op Plan:   Post-operative Plan: Extubation in OR and Possible Post-op  intubation/ventilation  Informed Consent: I have reviewed the patients History and Physical, chart, labs and discussed the procedure including the risks, benefits and alternatives for the proposed anesthesia with the patient or authorized representative who has indicated his/her understanding and acceptance.     Dental advisory given and Consent reviewed with POA  Plan Discussed with: CRNA  Anesthesia Plan Comments:        Anesthesia Quick Evaluation

## 2023-06-28 NOTE — TOC Initial Note (Signed)
 Transition of Care (TOC) - Initial/Assessment Note    Patient Details  Name: Erik [name]. MRN: [id number] Date of Birth: [birth year]  Transition of Care [facility]) CM/SW Contact:    Delilah Shan, LCSWA Phone Number: 06/28/2023, 4:04 PM  Clinical Narrative:                 CSW received consult for possible SNF placement at time of discharge. Due to patients current orientation CSW called patients spouse [name] regarding PT recommendation of SNF placement at time of discharge. Patients spouse reports PTA patient comes from home with her. Patients spouse expressed understanding of PT recommendation and would like for CSW to follow up with her tomorrow regarding PT recs./patients dc plan. No further questions reported at this time. CSW to continue to follow and assist with discharge planning needs.   Expected Discharge Plan: Home w Home Health Services Barriers to Discharge: Continued Medical Work up   Patient Goals and CMS Choice Patient states their goals for this hospitalization and ongoing recovery are:: rerturn home          Expected Discharge Plan and Services       Living arrangements for the past 2 months: Single Family Home                                      Prior Living Arrangements/Services Living arrangements for the past 2 months: Single Family Home Lives with:: Spouse Patient language and need for interpreter reviewed:: Yes Do you feel safe going back to the place where you live?: Yes      Need for Family Participation in Patient Care: Yes (Comment) Care giver support system in place?: Yes (comment) Current home services: DME (walker, cane, walk in shower) Criminal Activity/Legal Involvement Pertinent to Current Situation/Hospitalization: No - Comment as needed  Activities of Daily Living   ADL Screening (condition at time of admission) Independently performs ADLs?: Yes (appropriate for developmental age) Is the patient deaf or have difficulty  hearing?: No Does the patient have difficulty seeing, even when wearing glasses/contacts?: No Does the patient have difficulty concentrating, remembering, or making decisions?: No  Permission Sought/Granted Permission sought to share information with : Family Supports Permission granted to share information with : No (Contact information on chart)  Share Information with NAME: Erik [name]     Permission granted to share info w Relationship: Spouse  Permission granted to share info w Contact Information: [phone]  Emotional Assessment Appearance:: Appears stated age Attitude/Demeanor/Rapport: Unable to Assess, Intubated (Following Commands or Not Following Commands) Affect (typically observed): Unable to Assess Orientation: : Oriented to Self, Oriented to Place, Oriented to  Time, Oriented to Situation Alcohol / Substance Use: Not Applicable Psych Involvement: No (comment)  Admission diagnosis:  SBO (small bowel obstruction) (HCC) [K56.609] Abdominal pain [R10.9] S/P exploratory laparotomy [Z98.890] Patient Active Problem List   Diagnosis Date Noted   Anemia 06/20/2023   SBO (small bowel obstruction) (HCC) 06/20/2023   S/P exploratory laparotomy 06/20/2023   Abdominal pain 06/19/2023   CAP (community acquired pneumonia) 02/21/2023   Acute cough 09/27/2022   Abnormal CT scan 08/26/2022   Iron deficiency anemia due to chronic blood loss 06/23/2022   MSSA bacteremia 02/03/2022   Protein-calorie malnutrition, severe 02/01/2022   Iron deficiency anemia secondary to inadequate dietary iron intake 11/23/2021   Family history of B12 deficiency 11/23/2021   Hydrocele in adult 09/04/2020  Bronchiectasis (HCC) 10/24/2019   Restless leg syndrome 05/30/2018   Abdominal aortic aneurysm (AAA) (HCC) 07/07/2015   Arteriosclerosis of coronary artery 07/07/2015   Cardiomyopathy, ischemic 07/07/2015   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   Solitary pulmonary  nodule 04/16/2014   HTN (hypertension) 10/03/2013   CAD S/P percutaneous coronary angioplasty 09/19/2013   Chronic systolic heart failure (HCC) 09/19/2013   NSTEMI (non-ST elevated myocardial infarction) (HCC) 09/10/2013   Family history of coronary artery disease in brother 09/10/2013   ED (erectile dysfunction) 12/10/2011   COPD, 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 [place] [address] Phone: [phone] Fax: [phone]  [facility] [address] [place] [address] Phone: [phone] Fax: [phone]     Social Drivers of Health (SDOH) Social History: SDOH Screenings   Food Insecurity: No Food Insecurity (06/20/2023)  Housing: Low Risk  (06/20/2023)  Transportation Needs: No Transportation Needs (06/20/2023)  Utilities: Not At Risk (06/20/2023)  Alcohol Screen: Low Risk  (05/31/2019)  Depression (PHQ2-9): Low Risk  (09/27/2022)  Financial Resource Strain: Low Risk  (04/29/2021)  Physical Activity: Inactive (05/31/2019)  Stress: No Stress Concern Present (05/31/2019)  Tobacco Use: High Risk (06/28/2023)   SDOH Interventions:     Readmission Risk Interventions    02/17/2022    4:21 PM  Readmission Risk Prevention Plan  Transportation Screening Complete  PCP or Specialist Appt within 3-5 Days Complete  HRI or Home Care Consult Complete  Social Work Consult for Recovery Care Planning/Counseling Complete  Palliative Care Screening Complete  Medication Review Oceanographer)  Complete

## 2023-06-28 NOTE — Progress Notes (Addendum)
 8 Days Post-Op  Subjective: CC: Fever to 101.3 this am. Tachycardic. Soft BP's. Did get 5mg  amlodipine and 25mg  of metoprolol this am.  About to get IVF bolus. WBC 11.8 (8.6). LA wnl today.  He is down to 2L/o2. CXR yesterday w/ Decreasing confluence of the ill-defined hazy bilateral lung opacities with significant residual. Started on Zosyn this am by TRH for possible PNA.   Appears uncomfortable today. A&O x 4. Worsening abdominal pain. No n/v. BM yesterday. TF's currently at 45cc/hr and noted they were increased yesterday.Aaron Aas UOP down at 0.71ml/kg/hr over the last 12 hours (from 1.9 ml/kg/hr). Cr 1.26 -->1.37  ASA/Plavix still held. Plt 79 (63). Hgb 8.4 (8.7)  Objective: Vital signs in last 24 hours: Temp:  [97.1 F (36.2 C)-101.3 F (38.5 C)] 98.4 F (36.9 C) (04/15 0852) Pulse Rate:  [91-128] 119 (04/15 0852) Resp:  [20-39] 25 (04/15 0852) BP: (98-147)/(43-73) 145/73 (04/15 0852) SpO2:  [90 %-98 %] 93 % (04/15 0852) Weight:  [56.4 kg] 56.4 kg (04/15 0500) Last BM Date : 06/27/23  Intake/Output from previous day: 04/14 0701 - 04/15 0700 In: 1920.2 [I.V.:1302.3; NG/GT:617.9] Out: 1775 [Urine:1475; Stool:300] Intake/Output this shift: Total I/O In: 495.5 [I.V.:416; NG/GT:79.5] Out: -   PE: Gen:  Alert, NAD, pleasant Abd: Moderate distension with generalized ttp with guarding. Hypoactive BS. Laparoscopic incisions cdi. R groin incision, cdi and soft.  Msk: DP 1+ b/l.    Lab Results:  Recent Labs    06/27/23 0515 06/28/23 0554  WBC 8.6 11.8*  HGB 8.7* 8.4*  HCT 27.0* 25.5*  PLT 63* 79*   BMET Recent Labs    06/27/23 0515 06/28/23 0554  NA 155* 150*  K 4.2 4.4  CL 127* 125*  CO2 19* 17*  GLUCOSE 168* 176*  BUN 55* 58*  CREATININE 1.26* 1.37*  CALCIUM 7.7* 7.8*   PT/INR No results for input(s): "LABPROT", "INR" in the last 72 hours. CMP     Component Value Date/Time   NA 150 (H) 06/28/2023 0554   NA 138 08/18/2018 1135   K 4.4 06/28/2023 0554   CL  125 (H) 06/28/2023 0554   CO2 17 (L) 06/28/2023 0554   GLUCOSE 176 (H) 06/28/2023 0554   BUN 58 (H) 06/28/2023 0554   BUN 19 08/18/2018 1135   CREATININE 1.37 (H) 06/28/2023 0554   CREATININE 1.11 10/09/2021 1646   CALCIUM 7.8 (L) 06/28/2023 0554   PROT 4.8 (L) 06/27/2023 0515   ALBUMIN <1.5 (L) 06/27/2023 0515   AST 43 (H) 06/27/2023 0515   ALT 80 (H) 06/27/2023 0515   ALKPHOS 41 06/27/2023 0515   BILITOT 1.1 06/27/2023 0515   GFRNONAA 52 (L) 06/28/2023 0554   GFRAA 95 08/18/2018 1135   Lipase     Component Value Date/Time   LIPASE 61 (H) 03/09/2022 0812    Studies/Results: DG CHEST PORT 1 VIEW Result Date: 06/27/2023 CLINICAL DATA:  Dyspnea EXAM: PORTABLE CHEST 1 VIEW COMPARISON:  06/24/2023 and older x-ray FINDINGS: Stable enteric tube. Removal of the right IJ Cordis. New right-sided PICC with tip along the central SVC above the right atrium. Normal cardiopericardial silhouette. Calcified aorta. No pneumothorax or effusion. Apical pleural thickening. There is persistent bilateral hazy patchy opacities. Slightly less confluent than previous. Overall similar distribution. Recommend continued follow-up. Aortic endograft seen along the visualized upper abdomen at the very edge of the imaging field. IMPRESSION: Removal of right IJ Cordis with a new right-sided PICC. No pneumothorax. Decreasing confluence of the ill-defined hazy bilateral  lung opacities with significant residual. Recommend continued follow-up Electronically Signed   By: Adrianna Horde M.D.   On: 06/27/2023 13:16     Anti-infectives: Anti-infectives (From admission, onward)    Start     Dose/Rate Route Frequency Ordered Stop   06/28/23 0930  piperacillin-tazobactam (ZOSYN) IVPB 3.375 g  Status:  Discontinued        3.375 g 12.5 mL/hr over 240 Minutes Intravenous Every 12 hours 06/28/23 0832 06/28/23 0834   06/28/23 0930  piperacillin-tazobactam (ZOSYN) IVPB 3.375 g        3.375 g 12.5 mL/hr over 240 Minutes  Intravenous Every 8 hours 06/28/23 0834     06/24/23 2200  piperacillin-tazobactam (ZOSYN) IVPB 3.375 g        3.375 g 12.5 mL/hr over 240 Minutes Intravenous Every 12 hours 06/24/23 1013 06/27/23 1621   06/20/23 1000  piperacillin-tazobactam (ZOSYN) IVPB 3.375 g  Status:  Discontinued        3.375 g 12.5 mL/hr over 240 Minutes Intravenous Every 8 hours 06/20/23 0903 06/24/23 1013        Assessment/Plan POD 8 s/p dx laparoscopy by Dr. 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 Rosalva Comber on 06/20/23 - Intra-op noted to have some murky fluid in the pelvis. All of the small bowel was viable without evidence of ischemia or other abnormality. He did appear to have some venous congestion in the mesentery. The right colon, transverse colon, left colon, and rectosigmoid all appeared viable. The liver was viable. Case was handed over to vascular, Dr. 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  -  I am concerned about pt's hemodynamic changes, worsening abdominal pain and exam. To note LA is wnl. Does have new leukocytosis of 11.8. Hold TF's. Will get CT. I discussed w/ Vascular who recommended CTA. He does have worsening kidney fct w/ Cr 1.37 today. Pt A&O x4 and wife at bedside. Discussed risk of IV contrast leading to worsening AKI and potential of renal failure requiring dialysis. They understand risk and are okay w/ proceeding. I have reached out to primary as well. They already restarted abx this am for possible pna that would be sufficient for intra-abdominal coverage.    FEN -  Failed swallow. Hold TF's as above. TPN. 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 Plt 79 ID - Zosyn  Foley - in place, strict I/O, per primary - okay to d/c from our standpoint   - Per primary -  VDRF - extubated 4/8, off BiPAP AKI Electrolyte abnormalities.  Acute on  chronic anemia Hx CAD status post PCI Hx COPD Hx chronic HFrEF Hx AAA s/p repair Hx PAD Hx carotid artery stenosis s/p carotid endarterectomy      LOS: 8 days    Delton Filbert, [facility] 06/28/2023, 10:52 AM Please see Amion for pager number during day hours 7:00am-4:30pm

## 2023-06-28 NOTE — Anesthesia Procedure Notes (Signed)
 Procedure Name: Intubation Date/Time: 06/28/2023 3:17 PM  Performed by: Gabe Jock, CRNAPre-anesthesia Checklist: Patient identified, Emergency Drugs available, Suction available and Patient being monitored Patient Re-evaluated:Patient Re-evaluated prior to induction Oxygen Delivery Method: Circle System Utilized Preoxygenation: Pre-oxygenation with 100% oxygen Induction Type: IV induction, Rapid sequence and Cricoid Pressure applied Laryngoscope Size: Mac and 4 Grade View: Grade I Tube type: Oral Tube size: 7.5 mm Number of attempts: 1 Airway Equipment and Method: Stylet Placement Confirmation: ETT inserted through vocal cords under direct vision, positive ETCO2 and breath sounds checked- equal and bilateral Secured at: 23 cm Tube secured with: Tape Dental Injury: Teeth and Oropharynx as per pre-operative assessment

## 2023-06-28 NOTE — Progress Notes (Signed)
 PHARMACY - TOTAL PARENTERAL NUTRITION CONSULT NOTE   Indication:  malnutrition, suspected ileus  Patient Measurements: Height: 5\' 6"  (167.6 cm) Weight: 56.4 kg (124 lb 5.4 oz) IBW/kg (Calculated) : 63.8 TPN AdjBW (KG): 50.8 Body mass index is 20.07 kg/m.  Assessment:  83yoM admitted 4/7 for complaints of acute on chronic abdominal pain (2 - 3 month history, worsened for 2 days prior to admission). Initial CT abdomen/pelvis showed concern for early SBO vs ileus as well as stenosis of celiac artery and SMA, mesenteric stent placed. Underwent diagnostic laparoscopy and mesenteric angiography on 4/7, no bowel ischemia appreciated. All of small bowel viable per surgery note.  Has had weight loss over past several years from 132 lbs to 112 lbs despite normal dietary intake, now has muscle/fat depletion with concern for severe malnutrition on RD exam. Has not received enteral nutrition since admission on 4/7, NG tube to suction. Per vascular surgery team, abdominal pain may be 2/2 edematous bowel so holding tube feeding. Pharmacy consulted for TPN 4/9.  Glucose / Insulin: last A1c 5.6% - CBGs acceptable but trending up Used 5 units SSI in the past 24 hrs Electrolytes: Na/CL down to 150/125 (FW Q2H), low CO2 (max acetate in TPN), others WNL (K up to 4.4 post , Phos up to 2.8 post 2 packets) Last furosemide 4/10 Renal: Scr trend down 1.26 (baseline <1), BUN trend down 55 (22 on admission) Hepatic: LFTs trend down, Albumin < 1.5, Tbili 1.1, TG WNL Intake / Output; MIVF: UOP 1.1 mL/kg/hr, stool , LBM 4/14  GI Imaging: 4/6 CT: multiple fluid filled mildly dilated loops of small bowel 4/7 KUB: decr gaseous distension of small bowel loops, retained stool in R colon 4/7 KUB SBO protocol: contrast within colonic lumen GI Surgeries / Procedures:  4/7 Diagnostic laparoscopy and mesenteric angiography  Central access: R internal jugular 06/20/23 TPN start date: 06/23/23  Nutritional  Goals: Goal TPN rate is 60 mL/hr (provides 81g AA and 1527 kCal per day)  RD Estimated Needs Total Energy Estimated Needs: 1500-1800 kcal/d Total Protein Estimated Needs: 80-95g/d Total Fluid Estimated Needs: 1.8L/d  Current Nutrition:  TPN Vital 1.5 at 45 ml/hr (goal rate)  Plan:  Continue TPN at goal 60 ml/hr to provide 100% of needs Electrolytes in TPN: removed Na 4/11, empirically reducing K to 30 mEq/L since getting CT with contrast, Ca 3 mEq/L, Mg 5 mEq/L, Phos 15 mmol/L, max acetate Add standard MVI and trace elements to TPN Continue sensitive SSI Q6H for now Thiamine x7 days per RD (4/10 - 4/16)  Monitor TPN labs on Mon/Thurs - labs in AM  F/u CT and ability to resume TF   Yukio Bisping D. Marikay Show, PharmD, BCPS, BCCCP 06/28/2023, 11:07 AM

## 2023-06-28 NOTE — Anesthesia Procedure Notes (Signed)
 Arterial Line Insertion Start/End4/15/2025 2:40 AM, 06/28/2023 2:47 AM Performed by: Arvie Latus, MD, Gabe Jock, CRNA, CRNA  Patient location: Pre-op. Preanesthetic checklist: patient identified, IV checked, site marked, risks and benefits discussed, surgical consent, monitors and equipment checked, pre-op evaluation, timeout performed and anesthesia consent Lidocaine 1% used for infiltration Left, radial was placed Catheter size: 20 G Hand hygiene performed  and maximum sterile barriers used   Attempts: 1 Procedure performed without using ultrasound guided technique. Following insertion, dressing applied. Post procedure assessment: normal and unchanged  Patient tolerated the procedure well with no immediate complications.

## 2023-06-28 NOTE — Evaluation (Addendum)
 Occupational Therapy Evaluation Patient Details Name: Erik [name]. MRN: [id number] DOB: 11/24/1940 Today's Date: 06/28/2023   History of Present Illness   Erik [name] is a 83 yr old male admitted to the hospital with abdominal pain which has been ongoing for 2-3 months. He was found to have acute mesenteric ischemia and is s/p stenting of the superior mesenteric artery  on 06-20-2023. During this hospital stay he has also noted to be with acute metabolic encephalopathy, severe protein calorie malnutrition, and acute respiratory failure with hypoxia. PMH: CAD s/p PCI, chronic heart failure, AAA s/p endovascular repair, COPD, ischemic cardiomyopathy     Clinical Impressions The pt is presenting significantly below his baseline level of functioning for self-care management, as he currently requires max to total assist for all ADLs. He is limited by the below listed deficits (see OT problem list). During the session today, he was also noted to be with significant deconditioning, gross weakness, decreased activity tolerance, lethargy, and reports of generalized pain "everywhere" with activity. He required increased time and occasional repetition of prompts in order to follow commands. His blood pressure was taken and noted to be 87/53 seated EOB; given his low blood pressure, attempts at out of bed activity were deferred. Upon return to supine, his blood pressure was 95/39, then 89/39 (nurse and PA made aware). He will benefit from further OT services to facilitate improved ADL performance & to decrease the risk for further weakness and progressive functional decline. Patient will benefit from continued inpatient follow up therapy, <3 hours/day.      If plan is discharge home, recommend the following:   Two people to help with walking and/or transfers;A lot of help with bathing/dressing/bathroom;Assistance with cooking/housework;Assistance with feeding;Direct supervision/assist for medications  management;Direct supervision/assist for financial management;Assist for transportation;Help with stairs or ramp for entrance     Functional Status Assessment   Patient has had a recent decline in their functional status and demonstrates the ability to make significant improvements in function in a reasonable and predictable amount of time.     Equipment Recommendations   Other (comment) (to be determined, pending functional status and potential progress)     Recommendations for Other Services         Precautions/Restrictions   Precautions Precautions: Fall Restrictions Weight Bearing Restrictions Per Provider Order: No Other Position/Activity Restrictions: monitor blood pressure, rectal tube     Mobility Bed Mobility Overal bed mobility: Needs Assistance Bed Mobility: Rolling, Supine to Sit, Sit to Supine Rolling: Total assist   Supine to sit: Total assist, +2 for physical assistance Sit to supine: Total assist, +2 for physical assistance        Transfers      General transfer comment: deferred, due to pt with low blood pressure upon performing supine to sit      Balance       Sitting balance - Comments: static sitting-fair. dynamic sitting-poor       Standing balance comment: not assessed         ADL either performed or assessed with clinical judgement   ADL Overall ADL's : Needs assistance/impaired Eating/Feeding: Total assistance Eating/Feeding Details (indicate cue type and reason): NG tube Grooming: Maximal assistance;Bed level   Upper Body Bathing: Maximal assistance;Bed level   Lower Body Bathing: Total assistance;Bed level   Upper Body Dressing : Maximal assistance;Bed level   Lower Body Dressing: Total assistance;Bed level       Toileting- Clothing Manipulation and Hygiene: Total assistance;Bed level  Pertinent Vitals/Pain Pain Assessment Pain Assessment: Faces Pain Score: 5  Pain  Location: Pt stated "everywhere" when he was assisted into sitting edge of bed Pain Intervention(s): Monitored during session, Repositioned, Limited activity within patient's tolerance     Extremity/Trunk Assessment Upper Extremity Assessment Upper Extremity Assessment: Generalized weakness;Right hand dominant   Lower Extremity Assessment Lower Extremity Assessment: Generalized weakness       Communication Communication Factors Affecting Communication: Hearing impaired   Cognition Arousal: Lethargic Behavior During Therapy: Flat affect               OT - Cognition Comments: Oriented to person, place, month, and year. When asked why he was in the hospital if stated, "for surgery."  Required cues for alertness, as well as increased time and repetition of prompts in order to follow simple commands                 Following commands: Impaired Following commands impaired: Follows one step commands inconsistently     Cueing  General Comments   Cueing Techniques: Verbal cues;Tactile cues              Home Living Family/patient expects to be discharged to:: Private residence Living Arrangements: Spouse/significant other Available Help at Discharge: Family Type of Home: House Home Access: Ramped entrance     Home Layout: One level     Bathroom Shower/Tub: Tub/shower unit         Home Equipment: Pharmacist, hospital (2 wheels);Cane - single point          Prior Functioning/Environment Prior Level of Function : Independent/Modified Independent;Driving             Mobility Comments: Independent with ambulation. ADLs Comments: Per the pt's spouse, he was independent with ADLs, driving, and he enjoyed gardening.    OT Problem List: Decreased strength;Decreased activity tolerance;Impaired balance (sitting and/or standing);Decreased knowledge of use of DME or AE;Pain   OT Treatment/Interventions: Self-care/ADL training;Therapeutic  exercise;Energy conservation;DME and/or AE instruction;Patient/family education;Balance training;Therapeutic activities      OT Goals(Current goals can be found in the care plan section)   Acute Rehab OT Goals OT Goal Formulation: With patient/family Time For Goal Achievement: 07/12/23 Potential to Achieve Goals: Good ADL Goals Pt Will Perform Grooming: with set-up;with supervision;sitting Pt Will Perform Upper Body Dressing: with set-up;with supervision;sitting Pt Will Transfer to Toilet: with contact guard assist;stand pivot transfer;bedside commode Additional ADL Goal #1: The pt will perform bed mobility with CGA, in prep for progressive ADL participation.   OT Frequency:  Min 2X/week  Co-evaluation: PT/OT/SLP Co-Evaluation/Treatment: Yes Reason for Co-Treatment: Complexity of the patient's impairments (multi-system involvement);For patient/therapist safety;To address functional/ADL transfers PT goals addressed during session: Mobility/safety with mobility;Balance OT goals addressed during session: ADL's and self-care;Strengthening/ROM         AM-PAC OT "6 Clicks" Daily Activity     Outcome Measure Help from another person eating meals?: Total Help from another person taking care of personal grooming?: A Lot Help from another person toileting, which includes using toliet, bedpan, or urinal?: Total Help from another person bathing (including washing, rinsing, drying)?: A Lot Help from another person to put on and taking off regular upper body clothing?: A Lot Help from another person to put on and taking off regular lower body clothing?: Total 6 Click Score: 9   End of Session Equipment Utilized During Treatment: Oxygen Nurse Communication: Other (comment) (pt with low blood pressure)  Activity Tolerance: Patient limited by lethargy Patient left: in  bed;with bed alarm set;with nursing/sitter in room;with family/visitor present  OT Visit Diagnosis: Muscle weakness  (generalized) (M62.81)                Time: 8295-6213 OT Time Calculation (min): 23 min Charges:  OT General Charges $OT Visit: 1 Visit OT Evaluation $OT Eval Moderate Complexity: 1 Mod   Tira Lafferty L Oleva Koo, OTR/L 06/28/2023, 11:42 AM

## 2023-06-28 NOTE — Progress Notes (Addendum)
 [facility] PROGRESS NOTE   Erik [name]. WJX:[id number] DOB: 02-25-1941 [name]: 06/19/2023  PCP: Excell Seltzer, MD  Brief History: 83 year old male patient with a known history of severe coronary artery disease with prior coronary artery stenting as well as AAA repair complicated further by heart failure with reduced EF COPD and tobacco abuse presented to the emergency room at [facility] on 4/6 with 2-day history of progressive abdominal pain which had been ongoing and intermittent over 2 to 3 months but acutely worse.  Concern was for mesenteric ischemia.  Patient was hospitalized for further management.  Consultants: Vascular surgery.  Critical care medicine.  General surgery.  Procedures: Exploratory laparotomy.  SMA stenting.    Subjective/Interval History: Patient received pain medications earlier today and so he is quite somnolent.  He was experiencing abdominal pain.  His wife is at the bedside.  Noted to be febrile this morning.   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  Patient was seen by vascular surgery.  Was also seen by general surgery. Underwent diagnostic laparoscopy followed by SMA stenting. Plavix currently on hold due to thrombocytopenia.  Vascular surgery and general surgery following. Platelet count slowly improving. Seen by general surgery this morning and thought to have worsened abdominal exam.  CT angiogram to be done on an urgent basis. Patient noted to have low blood pressures.  This is in the setting of narcotics that were given earlier today along with amlodipine and metoprolol.  Lactic acid level noted to be normal.  Patient was given IV fluid boluses with improvement in blood pressure.  No focal neurological deficits noted.  Continue to monitor closely.  Amlodipine and metoprolol discontinued for now. Procalcitonin level noted to be elevated.  Follow-up on blood  cultures.  Patient started back on Zosyn.  Acute respiratory failure with hypoxia/aspiration pneumonia Patient was on Zosyn and completed treatment on 4/13. Patient was noted to be dyspneic yesterday and today.  Chest x-ray showed persistent infiltrates mostly on the right lung. WBC noted to be higher today and he again spiked fever.  Will do blood cultures check lactic acid and procalcitonin. Will resume Zosyn at this time. Remains on oxygen at 2 L/min.  Saturations are in the mid 90s.  Acute metabolic encephalopathy/ICU delirium Was on Precedex in the ICU and currently off of it.  Somnolent this morning due to narcotics.  Acute kidney injury/H/o BPH/metabolic acidosis Baseline renal function is normal. Creatinine peaked at 2.17 and has improved back to baseline.  Monitor urine output.  Creatinine noted to be slightly higher today.  Will recheck labs tomorrow. Metabolic acidosis noted. Foley catheter is noted.  He does have a history of BPH.  Patient was on Flomax prior to admission which is currently on hold.  Will resume when able to take orally. Continue Foley catheter for now since his mobility is poor and he remains at high risk for retention.  Thrombocytopenia Possibly due to sepsis critical illness.  Zosyn could have also contributed.   Had to resume Zosyn as mentioned above.   Platelet count has improved this morning.  Monitor platelet counts closely.  If platelet counts again decreased then Zosyn will have to be changed to an alternative antibiotic.   Severe protein calorie malnutrition Started on tube feedings but experienced refeeding syndrome. Currently on TPN. Okay for tube feedings per general surgery.  Trickle feed has been started.  Nutritionist is following.  Once  his tube feeding is at goal TPN can be discontinued.  Oropharyngeal dysphagia Noted to be NPO.  Speech therapy following.  Hypernatremia/hypokalemia Free water was increased yesterday with improvement in  sodium levels.  Continue to monitor Potassium level has improved.  Normocytic anemia No evidence for bleeding.  Continue to monitor hemoglobin.  Abnormal LFTs Probably due to acute illness.  AST ALT have improved.  History of coronary artery disease status post PCI in the past Stable.  Continue metoprolol.  History of CVA/carotid artery stenosis status post endarterectomy Stable.  History of AAA s/p repair Stable.  History of COPD Continue with the nebulizer treatments.  Follow-up with chest x-ray.  DVT Prophylaxis: SCDs Code Status: Full code Family Communication: Discussed with patient and his wife Disposition Plan: To be determined.  PT and OT eval    Medications: Scheduled:  amLODipine  5 mg Per Tube Daily   arformoterol  15 mcg Nebulization BID   budesonide (PULMICORT) nebulizer solution  0.25 mg Nebulization BID   Chlorhexidine Gluconate Cloth  6 each Topical Daily   feeding supplement (PROSource TF20)  60 mL Per Tube Daily   free water  200 mL Per Tube Q2H   insulin aspart  0-9 Units Subcutaneous Q6H   metoprolol tartrate  25 mg Per Tube BID   mupirocin ointment  1 Application Nasal BID   pantoprazole (PROTONIX) IV  40 mg Intravenous Q24H   QUEtiapine  25 mg Per Tube BID   revefenacin  175 mcg Nebulization Daily   rOPINIRole  1.5 mg Per Tube 2 times per day   And   rOPINIRole  3 mg Per Tube QHS   sodium chloride flush  10-40 mL Intracatheter Q12H   sodium chloride flush  3 mL Intravenous Q12H   thiamine  100 mg Per Tube Daily   Continuous:  feeding supplement (VITAL 1.5 CAL) 45 mL/hr at 06/28/23 0814   piperacillin-tazobactam (ZOSYN)  IV     sodium chloride     TPN ADULT (ION) 60 mL/hr at 06/28/23 0800   WUJ:[id number], Gerhardt's butt cream, HYDROmorphone (DILAUDID) injection, labetalol, ondansetron (ZOFRAN) IV, mouth rinse, oxyCODONE, sodium chloride flush, sodium chloride flush  Antibiotics: Anti-infectives (From admission, onward)    Start      Dose/Rate Route Frequency Ordered Stop   06/28/23 0930  piperacillin-tazobactam (ZOSYN) IVPB 3.375 g  Status:  Discontinued        3.375 g 12.5 mL/hr over 240 Minutes Intravenous Every 12 hours 06/28/23 0832 06/28/23 0834   06/28/23 0930  piperacillin-tazobactam (ZOSYN) IVPB 3.375 g        3.375 g 12.5 mL/hr over 240 Minutes Intravenous Every 8 hours 06/28/23 0834     06/24/23 2200  piperacillin-tazobactam (ZOSYN) IVPB 3.375 g        3.375 g 12.5 mL/hr over 240 Minutes Intravenous Every 12 hours 06/24/23 1013 06/27/23 1621   06/20/23 1000  piperacillin-tazobactam (ZOSYN) IVPB 3.375 g  Status:  Discontinued        3.375 g 12.5 mL/hr over 240 Minutes Intravenous Every 8 hours 06/20/23 0903 06/24/23 1013       Objective:  Vital Signs  Vitals:   06/28/23 0715 06/28/23 0800 06/28/23 0819 06/28/23 0852  BP:   (!) 98/43 (!) 145/73  Pulse:  (!) 119  (!) 119  Resp:  (!) 33    Temp: (!) 101.3 F (38.5 C)  98.2 F (36.8 C)   TempSrc:   Axillary   SpO2:  95%    Weight:  Height:        Intake/Output Summary (Last 24 hours) at 06/28/2023 0935 Last data filed at 06/28/2023 0800 Gross per 24 hour  Intake 2216.23 ml  Output 1775 ml  Net 441.23 ml   Filed Weights   06/26/23 0400 06/27/23 0500 06/28/23 0500  Weight: 34.3 kg 57.6 kg 56.4 kg    General appearance: Somnolent this morning Resp: Tachypnea noted.  No use of accessory muscles.  Coarse breath sounds bilaterally.  Crackles on the right. Cardio: S1-S2 is tachycardic regular.  No S3-S4.  No rubs murmurs or bruit GI: Abdomen is soft.  Nontender nondistended.  Bowel sounds are present normal.  No masses organomegaly [place] after receiving narcotics earlier today.   Lab Results:  Data Reviewed: I have personally reviewed following labs and reports of the imaging studies  CBC: Recent Labs  Lab 06/25/23 0430 06/25/23 1030 06/26/23 0428 06/27/23 0515 06/28/23 0554  WBC 5.7 6.9 7.3 8.6 11.8*  HGB 10.0* 10.1* 9.1*  8.7* 8.4*  HCT 30.4* 30.5* 27.5* 27.0* 25.5*  MCV 83.3 84.7 84.4 85.7 86.1  PLT 72* 62* 52* 63* 79*    Basic Metabolic Panel: Recent Labs  Lab 06/24/23 1630 06/25/23 0430 06/25/23 1640 06/26/23 0428 06/26/23 1705 06/27/23 0515 06/27/23 1815 06/28/23 0554  NA  --  156* 152* 155* 153* 155*  --  150*  K  --  2.9* 3.0* 3.2* 3.2* 4.2  --  4.4  CL  --  123* 122* 124* 126* 127*  --  125*  CO2  --  23 25 23  20* 19*  --  17*  GLUCOSE  --  145* 138* 141* 147* 168*  --  176*  BUN  --  85* 72* 63* 55* 55*  --  58*  CREATININE  --  1.59* 1.28* 1.21 1.14 1.26*  --  1.37*  CALCIUM  --  8.3* 7.9* 8.0* 8.0* 7.7*  --  7.8*  MG 2.1 2.2 2.0 1.9 2.5* 2.5* 2.3 2.3  PHOS 1.1* 1.2* 3.6  --   --  2.5  --  2.8    GFR: Estimated Creatinine Clearance: 33.2 mL/min (A) (by C-G formula based on SCr of 1.37 mg/dL (H)).  Liver Function Tests: Recent Labs  Lab 06/22/23 1700 06/23/23 0500 06/25/23 0430 06/25/23 1640 06/27/23 0515  AST  --  265* 107*  --  43*  ALT  --  334* 205*  --  80*  ALKPHOS  --  44 35*  --  41  BILITOT  --  1.1 0.7  --  1.1  PROT  --  6.2* 5.1*  --  4.8*  ALBUMIN 1.9* 2.0* 1.7* 1.5* <1.5*   CBG: Recent Labs  Lab 06/27/23 0616 06/27/23 1227 06/27/23 1828 06/28/23 0003 06/28/23 0552  GLUCAP 167* 139* 125* 183* 167*    Lipid Profile: Recent Labs    06/27/23 0515  TRIG 142    Recent Results (from the past 240 hours)  MRSA Next Gen by PCR, Nasal     Status: Abnormal   Collection Time: 06/25/23  9:59 PM   Specimen: Nasal Mucosa; Nasal Swab  Result Value Ref Range Status   MRSA by PCR Next Gen DETECTED (A) NOT DETECTED Final    Comment: RESULT CALLED TO, READ BACK BY AND VERIFIED WITH: B WARNER,RN@[id number]  06/26/23 MK (NOTE) The GeneXpert MRSA Assay (FDA approved for NASAL specimens only), is one component of a comprehensive MRSA colonization surveillance program. It is not intended to diagnose MRSA infection nor to  guide or monitor treatment for MRSA  infections. Test performance is not FDA approved in patients less than 10 years old. Performed at [facility], [address], [place], [place] [address]       Radiology Studies: DG CHEST PORT 1 VIEW Result Date: 06/27/2023 CLINICAL DATA:  Dyspnea EXAM: PORTABLE CHEST 1 VIEW COMPARISON:  06/24/2023 and older x-ray FINDINGS: Stable enteric tube. Removal of the right IJ Cordis. New right-sided PICC with tip along the central SVC above the right atrium. Normal cardiopericardial silhouette. Calcified aorta. No pneumothorax or effusion. Apical pleural thickening. There is persistent bilateral hazy patchy opacities. Slightly less confluent than previous. Overall similar distribution. Recommend continued follow-up. Aortic endograft seen along the visualized upper abdomen at the very edge of the imaging field. IMPRESSION: Removal of right IJ Cordis with a new right-sided PICC. No pneumothorax. Decreasing confluence of the ill-defined hazy bilateral lung opacities with significant residual. Recommend continued follow-up Electronically Signed   By: Adrianna Horde M.D.   On: 06/27/2023 13:16       LOS: 8 days   Damian Buckles [employer] on [email or web address]  06/28/2023, 9:35 AM

## 2023-06-28 NOTE — Progress Notes (Signed)
 MRSA swab positive in nares. Topical tx order already in.

## 2023-06-28 NOTE — Transfer of Care (Signed)
 Immediate Anesthesia Transfer of Care Note  Patient: Erik [name].  Procedure(s) Performed: AORTOGRAM INSERTION OF GORE VIABAHN VBX 6mm X 19mm STENT AND 28.42mm X 4.5cm GORE EXLUDER STENT (Left) LAPAROSCOPY, DIAGNOSTIC (Abdomen) LAPAROTOMY, EXPLORATORY (Abdomen) COLECTOMY, WITH ILEOSTOMY CREATION (Abdomen)  Patient Location: PACU and ICU  Anesthesia Type:General  Level of Consciousness: sedated  Airway & Oxygen Therapy: Patient remains intubated per anesthesia plan and Patient placed on Ventilator (see vital sign flow sheet for setting)  Post-op Assessment: Report given to RN and Post -op Vital signs reviewed and stable  Post vital signs: Reviewed and stable  Last Vitals:  Vitals Value Taken Time  BP 120/42 06/28/23 1831  Temp 98.6   Pulse 85 06/28/23 1841  Resp 26 06/28/23 1841  SpO2 100 % 06/28/23 1841  Vitals shown include unfiled device data.  Last Pain:  Vitals:   06/28/23 1226  TempSrc: Axillary  PainSc:          Complications: No notable events documented.

## 2023-06-28 NOTE — Progress Notes (Signed)
 MEWS Progress Note  Patient Details Name: Erik [name]. MRN: [id number] DOB: 07-Dec-1940 Today's Date: 06/28/2023   MEWS Flowsheet Documentation:  Assess: MEWS Score Temp: 97.6 F (36.4 C) BP: (!) 114/56 MAP (mmHg): 71 Pulse Rate: (!) 110 ECG Heart Rate: (!) 110 Resp: (!) 27 Level of Consciousness: Alert SpO2: 96 % O2 Device: Nasal Cannula (Simultaneous filing. User may not have seen previous data.) Patient Activity (if Appropriate): In bed Heater temperature: 93.2 F (34 C) O2 Flow Rate (L/min): 4 L/min (Simultaneous filing. User may not have seen previous data.) FiO2 (%): 40 % Assess: MEWS Score MEWS Temp: 0 MEWS Systolic: 0 MEWS Pulse: 1 MEWS RR: 2 MEWS LOC: 0 MEWS Score: 3 MEWS Score Color: Yellow Assess: SIRS CRITERIA SIRS Temperature : 0 SIRS Respirations : 1 SIRS Pulse: 1 SIRS WBC: 0 SIRS Score Sum : 2 SIRS Temperature : 0 SIRS Pulse: 1 SIRS Respirations : 1 SIRS WBC: 0 SIRS Score Sum : 2 Assess: if the MEWS score is Yellow or Red Were vital signs accurate and taken at a resting state?: Yes Does the patient meet 2 or more of the SIRS criteria?: Yes Does the patient have a confirmed or suspected source of infection?: Yes (IV abx completed 4/13) MEWS guidelines implemented : Yes, yellow Treat MEWS Interventions: Considered administering scheduled or prn medications/treatments as ordered Take Vital Signs Increase Vital Sign Frequency : Yellow: Q2hr x1, continue Q4hrs until patient remains green for 12hrs Escalate MEWS: Escalate: Yellow: Discuss with charge nurse and consider notifying provider and/or RRT Notify: Charge Nurse/RN Name of Charge Nurse/RN Notified: Antionette Kirks, RN      Ector Goltz 06/28/2023, 4:30 AM

## 2023-06-28 NOTE — Care Management Important Message (Signed)
 Important Message  Patient Details  Name: Erik [name]. MRN: [id number] Date of Birth: 19-Aug-1940   Important Message Given:  Yes - Medicare IM     Janith Melnick 06/28/2023, 9:26 AM

## 2023-06-28 NOTE — Consult Note (Signed)
 NAME:  Erik [name]., MRN:  [id number], DOB:  02/18/1941, LOS: 8 ADMISSION DATE:  06/19/2023, CONSULTATION DATE:  06/28/23 REFERRING MD:  Rito Ehrlich, CHIEF COMPLAINT:   hypotension  History of Present Illness:  83 y.o. M with PMH significant for CAD, HFrEF, collagen vascular disease, COPD, HL, HTN, ischemic cardiomyopathy who presented to the ED on 4/7 with abdominal pain.  His initial CT showed early SBO with stenosis of the celiac and SMA.  He was seen by vascular and underwent an SMA stent for acute mesenteric ischemia on 4/7, he continued to have abdominal pain and found to have an endoleak, this was discussed with general surgery and he was tken to the OR for an ex lap which revealed a necrotic R colon. R colectomy was done with ostomy formation.  Vascular surgery also placed a L renal artery and aortic artery stent.  He required pressors and was left intubated so PCCM consulted for admission  Pertinent  Medical History   has a past medical history of Adenomatous colon polyp, Aortic aneurysm (HCC), BPH (benign prostatic hypertrophy), CAD (coronary artery disease), Carotid artery stenosis, Cataract, CHF (congestive heart failure) (HCC), Chronic systolic heart failure (HCC), Collagen vascular disease (HCC), COPD (chronic obstructive pulmonary disease) (HCC), Coronary artery disease, Diverticulosis, GERD (gastroesophageal reflux disease), Heart murmur, History of colonic polyps, Hyperlipidemia, Hyperplastic colon polyp, Hypertension, Ischemic cardiomyopathy, and RLS (restless legs syndrome).   Significant Hospital Events: Including procedures, antibiotic start and stop dates in addition to other pertinent events   4/7 admit, SMA stent  4/15 worsening pain, endoleak, back to OR for ex lap, ostomy placement   Interim History / Subjective:  As above   Objective   Blood pressure (!) 108/47, pulse 90, temperature 98.1 F (36.7 C), temperature source Axillary, resp. rate 20, height 5\' 6"  (1.676  m), weight 56.4 kg, SpO2 93%.        Intake/Output Summary (Last 24 hours) at 06/28/2023 1811 Last data filed at 06/28/2023 1805 Gross per 24 hour  Intake 4945.73 ml  Output 1375 ml  Net 3570.73 ml   Filed Weights   06/26/23 0400 06/27/23 0500 06/28/23 0500  Weight: 34.3 kg 57.6 kg 56.4 kg    General:  thin, elderly sedated on vent HEENT: ETT minimal output Neuro: heavily sedated CV: regular ext warm PULM:  harsh rhonci rhonci GI: ostomy and rectal tube and cortrak and NGT and JP drain, midline incision dressed w/o strikethrough Extremities: warm/dry, trace edema  Skin: looks a little jaundiced?  Labs, imaging, op note reviewed  Resolved Hospital Problem list   N/A  Assessment & Plan:  Ischemic colitis s/p right colectomy COPD not in flare Postoperative shock state: combination distributive, volume shifts, ABLA Aspiration pneumonitis, increased WOB- some of this was acidemia as well Postoperative vent management Acute kidney injury Severe protein calorie malnutrition PVD Thrombocytopenia CAD with prior PCI Prior AAA repair  - TPN - Levo/vaso for MAP 65 - Aggressive fluid repletion - Usual transfusion thresholds - Bicarb gtt, adjust vent PRN - Try to avoid acidemia, coagulopathy, and hypo - Zosyn fine for now - PAD bundle - Guarded prognosis  Best Practice (right click and "Reselect all SmartList Selections" daily)   Diet/type: TPN DVT prophylaxis not indicated GI prophylaxis: PPI Lines: Central line Foley:  Yes, and it is still needed Code Status:  full code Last date of multidisciplinary goals of care discussion [pending]  Labs   CBC: Recent Labs  Lab 06/25/23 0430 06/25/23 1030 06/26/23 0428 06/27/23  0515 06/28/23 0554  WBC 5.7 6.9 7.3 8.6 11.8*  HGB 10.0* 10.1* 9.1* 8.7* 8.4*  HCT 30.4* 30.5* 27.5* 27.0* 25.5*  MCV 83.3 84.7 84.4 85.7 86.1  PLT 72* 62* 52* 63* 79*    Basic Metabolic Panel: Recent Labs  Lab 06/24/23 1630 06/25/23 0430  06/25/23 1640 06/26/23 0428 06/26/23 1705 06/27/23 0515 06/27/23 1815 06/28/23 0554  NA  --  156* 152* 155* 153* 155*  --  150*  K  --  2.9* 3.0* 3.2* 3.2* 4.2  --  4.4  CL  --  123* 122* 124* 126* 127*  --  125*  CO2  --  23 25 23  20* 19*  --  17*  GLUCOSE  --  145* 138* 141* 147* 168*  --  176*  BUN  --  85* 72* 63* 55* 55*  --  58*  CREATININE  --  1.59* 1.28* 1.21 1.14 1.26*  --  1.37*  CALCIUM  --  8.3* 7.9* 8.0* 8.0* 7.7*  --  7.8*  MG 2.1 2.2 2.0 1.9 2.5* 2.5* 2.3 2.3  PHOS 1.1* 1.2* 3.6  --   --  2.5  --  2.8   GFR: Estimated Creatinine Clearance: 33.2 mL/min (A) (by C-G formula based on SCr of 1.37 mg/dL (H)). Recent Labs  Lab 06/25/23 1030 06/26/23 0428 06/27/23 0515 06/28/23 0554 06/28/23 0916  PROCALCITON  --   --   --   --  7.28  WBC 6.9 7.3 8.6 11.8*  --   LATICACIDVEN  --   --   --   --  1.4    Liver Function Tests: Recent Labs  Lab 06/22/23 1700 06/23/23 0500 06/25/23 0430 06/25/23 1640 06/27/23 0515  AST  --  265* 107*  --  43*  ALT  --  334* 205*  --  80*  ALKPHOS  --  44 35*  --  41  BILITOT  --  1.1 0.7  --  1.1  PROT  --  6.2* 5.1*  --  4.8*  ALBUMIN 1.9* 2.0* 1.7* 1.5* <1.5*   No results for input(s): "LIPASE", "AMYLASE" in the last 168 hours. No results for input(s): "AMMONIA" in the last 168 hours.  ABG    Component Value Date/Time   PHART 7.391 06/23/2023 0859   PCO2ART 34.5 06/23/2023 0859   PO2ART 288 (H) 06/23/2023 0859   HCO3 21.0 06/23/2023 0859   TCO2 22 06/23/2023 0859   ACIDBASEDEF 3.0 (H) 06/23/2023 0859   O2SAT 77.5 06/23/2023 0915     Coagulation Profile: No results for input(s): "INR", "PROTIME" in the last 168 hours.  Cardiac Enzymes: No results for input(s): "CKTOTAL", "CKMB", "CKMBINDEX", "TROPONINI" in the last 168 hours.  HbA1C: Hgb A1c MFr Bld  Date/Time Value Ref Range Status  06/23/2023 10:55 AM 5.6 4.8 - 5.6 % Final    Comment:    (NOTE)         Prediabetes: 5.7 - 6.4         Diabetes: >6.4          Glycemic control for adults with diabetes: <7.0   08/19/2022 08:19 AM 4.5 (L) 4.6 - 6.5 % Final    Comment:    Glycemic Control Guidelines for People with Diabetes:Non Diabetic:  <6%Goal of Therapy: <7%Additional Action Suggested:  >8%     CBG: Recent Labs  Lab 06/27/23 1227 06/27/23 1828 06/28/23 0003 06/28/23 0552 06/28/23 1220  GLUCAP 139* 125* 183* 167* 142*    Review of  Systems:   Intubated/sedated  Past Medical History:  He,  has a past medical history of Adenomatous colon polyp, Aortic aneurysm (HCC), BPH (benign prostatic hypertrophy), CAD (coronary artery disease), Carotid artery stenosis, Cataract, CHF (congestive heart failure) (HCC), Chronic systolic heart failure (HCC), Collagen vascular disease (HCC), COPD (chronic obstructive pulmonary disease) (HCC), Coronary artery disease, Diverticulosis, GERD (gastroesophageal reflux disease), Heart murmur, History of colonic polyps, Hyperlipidemia, Hyperplastic colon polyp, Hypertension, Ischemic cardiomyopathy, and RLS (restless legs syndrome).   Surgical History:   Past Surgical History:  Procedure Laterality Date   AORTOGRAM N/A 06/20/2023   Procedure: MESENTERIC AORTOGRAM;  Surgeon: [name], MD;  Location: [facility] OR;  Service: Vascular;  Laterality: N/A;   BUBBLE STUDY  02/03/2022   Procedure: BUBBLE STUDY;  Surgeon: Meriam Sprague, MD;  Location: [facility] ENDOSCOPY;  Service: Cardiovascular;;   cardiac stents  09-2013   CAROTID ENDARTERECTOMY  04/17/2008   right   CAROTID ENDARTERECTOMY  05/30/08   Left   CATARACT EXTRACTION W/PHACO Right 10/05/2018   Procedure: CATARACT EXTRACTION PHACO AND INTRAOCULAR LENS PLACEMENT (IOC), RIGHT;  Surgeon: Galen Manila, MD;  Location: [facility] ORS;  Service: Ophthalmology;  Laterality: Right;  [place] 01:06.4 cde 12.79 Fluid Pack Lot # [id number]   CATARACT EXTRACTION W/PHACO Left 11/02/2018   Procedure: CATARACT EXTRACTION PHACO AND INTRAOCULAR LENS PLACEMENT (IOC), LEFT;  Surgeon:  Galen Manila, MD;  Location: [facility] ORS;  Service: Ophthalmology;  Laterality: Left;  [place]  01:10 CDE 11.52 Fluid pack lot # [id number]   CHOLECYSTECTOMY     Gall Bladder   CORONARY ANGIOGRAM  09/11/2013   Procedure: CORONARY ANGIOGRAM;  Surgeon: Lennette Bihari, MD;  Location: [facility] CATH LAB;  Service: Cardiovascular;;   CORONARY ANGIOPLASTY     STENTS   HERNIA REPAIR     INSERTION OF ILIAC STENT N/A 06/20/2023   Procedure: INSERTION VIABAHN 7x29 STENT IN SUPERIOR MESENTERIC ARTERY;  Surgeon: [name], MD;  Location: [facility] OR;  Service: Vascular;  Laterality: N/A;  Insertion Mesenteric Artery Stent   LAPAROSCOPY N/A 06/20/2023   Procedure: LAPAROSCOPY, DIAGNOSTIC;  Surgeon: Violeta Gelinas, MD;  Location: [facility] OR;  Service: General;  Laterality: N/A;   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 He has a 50 pack-year smoking history. He has never been exposed to tobacco smoke. He uses smokeless tobacco. He reports that he does not drink alcohol and does not use drugs.   Family History:  His family history includes Cancer in his father; Emphysema in his father; Heart attack in his brother; Heart disease in his brother; Hyperlipidemia in his brother, brother, brother, and brother; Hypertension in his mother; Multiple sclerosis in his brother; Pancreatic cancer in his brother. There is no history of Kidney disease, Prostate cancer, Colon cancer, Esophageal cancer, Rectal cancer, or Stomach cancer.   Allergies Allergies  Allergen Reactions    Codeine Anaphylaxis and Swelling    throat swells   Heparin Other (See Comments)    R/O HIT; Patient's wife stated that heparin was not an allergy of the  patient; She is unaware of a heparin allergy     Home Medications  Prior to Admission medications   Medication Sig Start Date End Date Taking? Authorizing Provider  acetaminophen (TYLENOL) 500 MG tablet Take 1,000 mg by mouth every 6 (six) hours as needed for moderate pain (pain score 4-6).   Yes [provider]  aspirin EC 81 MG tablet Take 81 mg by mouth every evening.    Yes [provider]  clopidogrel (PLAVIX) 75 MG tablet TAKE 1 TABLET BY MOUTH EVERY DAY 11/03/22  Yes McLean, Dalton S, MD  cyanocobalamin (VITAMIN B12) 1000 MCG tablet Take 1,000 mcg by mouth daily.   Yes [provider]  ezetimibe-simvastatin (VYTORIN) 10-40 MG tablet TAKE 1 TABLET BY MOUTH EVERYDAY AT BEDTIME 10/06/22  Yes Bedsole, Amy E, MD  famotidine (PEPCID) 40 MG tablet Take 40 mg by mouth 2 (two) times daily.   Yes [provider]  FARXIGA 10 MG TABS tablet TAKE 1 TABLET BY MOUTH DAILY BEFORE BREAKFAST. 11/29/22  Yes Darlis Eisenmenger, MD  ferrous sulfate 325 (65 FE) MG tablet Take 325 mg by mouth daily with breakfast.   Yes [provider]  gabapentin (NEURONTIN) 100 MG capsule Take 1 capsule (100 mg total) by mouth 3 (three) times daily. Patient taking differently: Take 100-200 mg by mouth See admin instructions. Take 100mg  (1 capsule) by mouth every morning and 200mg  (2 capsules) at night. 03/29/23  Yes Bedsole, Amy E, MD  methocarbamol (ROBAXIN) 500 MG tablet TAKE 1 TABLET (500 MG TOTAL) BY MOUTH EVERY DAY AT BEDTIME AS NEEDED FOR MUSCLE SPASM 06/09/23  Yes Bedsole, Amy E, MD  metoprolol succinate (TOPROL-XL) 50 MG 24 hr tablet Take 1 tablet (50 mg total) by mouth daily. 03/29/23  Yes Darlis Eisenmenger, MD  mirtazapine (REMERON) 15 MG tablet TAKE 1 TABLET BY MOUTH EVERYDAY AT BEDTIME 03/07/23  Yes Bedsole, Amy E, MD  Omega-3  Fatty Acids (FISH OIL) 1000 MG CAPS Take 1 capsule by mouth daily.   Yes [provider]  rOPINIRole (REQUIP) 3 MG tablet TAKE 1/2 TABLET BY MOUTH IN THE MORNING, 1/2 TABLET MID DAY & TAKE 1 TABLET AT NIGHT 02/08/23  Yes Bedsole, Amy E, MD  spironolactone (ALDACTONE) 25 MG tablet TAKE 1/2 TABLET BY MOUTH DAILY 03/29/23  Yes McLean, Dalton S, MD  tamsulosin (FLOMAX) 0.4 MG CAPS capsule TAKE 1 CAPSULE BY MOUTH EVERY DAY 09/08/22  Yes Bedsole, Amy E, MD     Critical care time: 33 min cc time

## 2023-06-28 NOTE — Evaluation (Signed)
 Physical Therapy Evaluation Patient Details Name: Erik [name]. MRN: [id number] DOB: January 09, 1941 Today's Date: 06/28/2023  History of Present Illness  Erik [name] is a 83 yr old male admitted to the hospital with abdominal pain which has been ongoing for 2-3 months. He was found to have acute mesenteric ischemia and is s/p stenting of the superior mesenteric artery  on 06-20-2023. During this hospital stay he has also noted to be with acute metabolic encephalopathy, severe protein calorie malnutrition, and acute respiratory failure with hypoxia. PMH: CAD s/p PCI, chronic heart failure, AAA s/p endovascular repair, COPD, ischemic cardiomyopathy  Clinical Impression  PTA pt living with wife in single story home with ramped entrance. Pt completely independent, just put in his garden the day before hospitalization.  Pt is currently limited in safe mobility by hemodynamic instability,  generalized weakness, and decreased cognition. Pt is total Ax2 for sitting EoB, once placed in position with B UE support pt able to maintain seated balance. Pt BP in seated 87/53. Pt requires total A to return to bed. Pt BP dropped to 95/34. RN and Surgical PA made aware. Patient will benefit from continued inpatient follow up therapy, <3 hours/day. PT will continue to follow acutely.    If plan is discharge home, recommend the following: Two people to help with walking and/or transfers;Two people to help with bathing/dressing/bathroom;Assistance with cooking/housework;Assistance with feeding;Direct supervision/assist for medications management;Direct supervision/assist for financial management;Assist for transportation;Help with stairs or ramp for entrance   Can travel by private vehicle   No    Equipment Recommendations BSC/3in1     Functional Status Assessment Patient has had a recent decline in their functional status and demonstrates the ability to make significant improvements in function in a reasonable and  predictable amount of time.     Precautions / Restrictions Precautions Precautions: Fall Restrictions Weight Bearing Restrictions Per Provider Order: No Other Position/Activity Restrictions: monitor blood pressure, rectal tube      Mobility  Bed Mobility Overal bed mobility: Needs Assistance Bed Mobility: Rolling, Supine to Sit, Sit to Supine Rolling: Total assist   Supine to sit: Total assist, +2 for physical assistance Sit to supine: Total assist, +2 for physical assistance        Transfers                   General transfer comment: deferred, due to pt with low blood pressure upon performing supine to sit          Balance       Sitting balance - Comments: static sitting-fair. dynamic sitting-poor       Standing balance comment: not assessed                             Pertinent Vitals/Pain Pain Assessment Breathing: normal Negative Vocalization: occasional moan/groan, low speech, negative/disapproving quality Facial Expression: sad, frightened, frown Body Language: relaxed Consolability: unable to console, distract or reassure PAINAD Score: 4 Pain Location: Pt stated "everywhere" when he was assisted into sitting edge of bed Pain Intervention(s): Monitored during session, Limited activity within patient's tolerance, Premedicated before session    Home Living Family/patient expects to be discharged to:: Private residence Living Arrangements: Spouse/significant other Available Help at Discharge: Family Type of Home: House Home Access: Ramped entrance       Home Layout: One level Home Equipment: Pharmacist, hospital (2 wheels);[place] - single point      Prior Function Prior  Level of Function : Independent/Modified Independent;Driving             Mobility Comments: Independent with ambulation. ADLs Comments: Per the pt's spouse, he was independent with ADLs, driving, and he enjoyed gardening.     Extremity/Trunk  Assessment   Upper Extremity Assessment Upper Extremity Assessment: Generalized weakness    Lower Extremity Assessment Lower Extremity Assessment: Generalized weakness       Communication   Communication Factors Affecting Communication: Hearing impaired    Cognition Arousal: Lethargic Behavior During Therapy: Flat affect                             Following commands: Impaired Following commands impaired: Follows one step commands inconsistently     Cueing Cueing Techniques: Verbal cues, Tactile cues     General Comments General comments (skin integrity, edema, etc.): Pt with BP 87/53 with sitting EoB, HR 95bpm, SpO2 on 2L O2 via [place], with return to supine BP 95/34, RN notified, surgical PA in room with PT return to room after notifying RN        Assessment/Plan    PT Assessment Patient needs continued PT services  PT Problem List Decreased strength;Decreased activity tolerance;Decreased balance;Decreased mobility;Decreased coordination;Cardiopulmonary status limiting activity;Pain       PT Treatment Interventions DME instruction;Gait training;Functional mobility training;Therapeutic activities;Therapeutic exercise;Balance training;Patient/family education    PT Goals (Current goals can be found in the Care Plan section)  Acute Rehab PT Goals PT Goal Formulation: With family Time For Goal Achievement: 07/12/23 Potential to Achieve Goals: Fair    Frequency Min 3X/week     Co-evaluation PT/OT/SLP Co-Evaluation/Treatment: Yes Reason for Co-Treatment: Complexity of the patient's impairments (multi-system involvement);For patient/therapist safety;To address functional/ADL transfers PT goals addressed during session: Mobility/safety with mobility;Balance OT goals addressed during session: ADL's and self-care;Strengthening/ROM       AM-PAC PT "6 Clicks" Mobility  Outcome Measure Help needed turning from your back to your side while in a flat bed without  using bedrails?: Total Help needed moving from lying on your back to sitting on the side of a flat bed without using bedrails?: Total Help needed moving to and from a bed to a chair (including a wheelchair)?: Total Help needed standing up from a chair using your arms (e.g., wheelchair or bedside chair)?: Total Help needed to walk in hospital room?: Total Help needed climbing 3-5 steps with a railing? : Total 6 Click Score: 6    End of Session Equipment Utilized During Treatment: Oxygen Activity Tolerance: Patient limited by pain;Treatment limited secondary to medical complications (Comment) (low BP) Patient left: in bed;with call bell/phone within reach;with bed alarm set;Other (comment);with family/visitor present;with nursing/sitter in room (Surgical PA in room) Nurse Communication: Mobility status;Other (comment) (low BP with sitting EoB) PT Visit Diagnosis: Unsteadiness on feet (R26.81);Other abnormalities of gait and mobility (R26.89);Muscle weakness (generalized) (M62.81);Difficulty in walking, not elsewhere classified (R26.2);Pain Pain - part of body:  (abdomen greatest, but generalized pain)    Time: 1610-9604 PT Time Calculation (min) (ACUTE ONLY): 20 min   Charges:   PT Evaluation $PT Eval Moderate Complexity: 1 Mod   PT General Charges $$ ACUTE PT VISIT: 1 Visit         Shresta Risden B. Beverely Risen PT, DPT Acute Rehabilitation Services Please use secure chat or  Call Office [phone]   Elon Alas [facility] 06/28/2023, 11:53 AM

## 2023-06-28 NOTE — Progress Notes (Addendum)
 eLink Physician-Brief Progress Note Patient Name: Erik [name]. DOB: 1940/03/28 MRN: [id number]   Date of Service  06/28/2023  HPI/Events of Note  Patient presented with ischemic colitis status post right colectomy and a postoperative shock state currently mechanically ventilated.  Asked to review his ABG with evidence of persistent stable metabolic acidosis.  eICU Interventions  Continue current care, no intervention indicated   2227 -change CBG monitoring to every 4 hours with insulin sliding scale.  Patient has both NS and bicarb running at the same time.  Given profound acidemia, maintain bicarb hold NS  Intervention Category Intermediate Interventions: Hypotension - evaluation and management  Severin Bou 06/28/2023, 7:49 PM

## 2023-06-28 NOTE — Progress Notes (Signed)
 Critical ABG called to Elink.

## 2023-06-28 NOTE — Op Note (Incomplete)
 06/19/2023 - 06/28/2023  5:50 PM  PATIENT:  Erik [name].  83 y.o. male  PRE-OPERATIVE DIAGNOSIS:  possible ischemic bowel  POST-OPERATIVE DIAGNOSIS:  necrosis right colon  PROCEDURE:  Procedure(s): LAPAROSCOPY, DIAGNOSTIC (N/A) LAPAROTOMY, EXPLORATORY (N/A) RIGHT COLECTOMY WITH ILEOSTOMY CREATION (N/A)  SURGEON:  Surgeons and Role:    * Caralyn Chandler, MD - Primary    * Adalberto Acton, MD - Assisting  PHYSICIAN ASSISTANT:   ASSISTANTS: Dr. Lanell Pinta   ANESTHESIA:   general  EBL:  50cc   BLOOD ADMINISTERED:*** CC PRBC  DRAINS: none   LOCAL MEDICATIONS USED:  NONE  SPECIMEN:  Source of Specimen:  terminal ileum and right colon  DISPOSITION OF SPECIMEN:  PATHOLOGY  COUNTS:  YES  TOURNIQUET:  * No tourniquets in log *  DICTATION: .Dragon Dictation  PLAN OF CARE: Admit to inpatient   PATIENT DISPOSITION:  ICU - intubated and critically ill.   Delay start of Pharmacological VTE agent (>24hrs) due to surgical blood loss or risk of bleeding: no

## 2023-06-28 NOTE — Progress Notes (Signed)
 CT reviewed. Discussed w/ vascular  Vascular planning to take to the OR today. We will plan for diagnostic laparoscopy, exploratory laparotomy, possible bowel resection, possible ostomy and any other indicated procedures today as well.   I discussed with the patient and his wife the planned procedure and material risks. Risks include but are not limited to anesthesia (MI, CVA, prolonged intubation, aspiration, death), pain, bleeding,  infection, scarring, hernia, damage to surrounding structures (blood vessels/nerves/viscus/organs/ureter), ileus, leak from anastomosis, DVT/PE, and possible need for stoma/ileostomy. The patient and his wife's questions were answered to their satisfaction, they voiced understanding and elected to proceed with surgery.  Erik [name] M Erik Ferencz, PA-C

## 2023-06-28 NOTE — Op Note (Signed)
 OPERATIVE NOTE  PROCEDURE:   Ultrasound-guided access of left common femoral artery Aortogram Cannulation of right renal artery and selective angiogram Cannulation of left renal artery and selective angiogram Left renal artery stent placement, 6 x 19 VBX Aortic stent placement, 28.79mm Gore aortic cuff Pro-glide closure of left common femoral artery, preclose fashion  PRE-OPERATIVE DIAGNOSIS: Endoleak with rapid aneurysm growth  POST-OPERATIVE DIAGNOSIS: same as above   SURGEON: Daria Pastures MD  ASSISTANT(S): Gillis Santa, MD Given the complexity of the case,  the assistant was necessary in order to expedient the procedure and safely perform the technical aspects of the operation.  The assistant played a critical role in pinning wires while and tracking and advancing the stent devices. These skills could not have been adequately performed by a scrub tech assistant.    ANESTHESIA: general  ESTIMATED BLOOD LOSS: Minimal  FINDING(S): Type Ia versus type 4 endoleak Widely patent visceral stents on completion  This amount of rapid growth as well as the possibility of a fabric tear in the graft is very concerning for an aortic graft infection.  SPECIMEN(S): None from vascular portion, see general surgery note  INDICATIONS:   Erik [name]. is a 83 y.o. 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  My partner Dr. Karin Lieu took him emergently to the OR for angiogram.  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 Pattricia Boss at [facility] in 2017 with a Z-fen with bilateral renal stents and SMA scallop.  Dr. Karin Lieu was able to stent open the SMA and restore perfusion to the bowel.  At that time general surgery did perform laparoscopy which did not reveal any ischemic concerns.  Today one of the general surgeons reached out to [place] as the patient continued to have significant abdominal pain and a repeat CT scan was obtained which noted  an early endoleak with rapid expansion of his aneurysm of approximately 2 cm since his last CT that was performed 7 days ago.   Upon review of the images it appeared that there could be a 1A person 1C endoleak so he was planned to undergo an endoleak evaluation in the hybrid OR and general surgery would follow with laparoscopy to reevaluate his bowel.  Risks and benefits were reviewed with the patient and his family members and they elected to proceed.  DESCRIPTION: The patient was brought to the operating room and positioned supine on the operating room table.  General anesthesia was induced and the bilateral groins and abdomen were prepped and draped in the usual sterile fashion.  Preoperative antibiotics were administered and a timeout was performed. Given that Dr. Karin Lieu went through the right common femoral for his previous angiogram I elected to go through the left common femoral artery.  This was accessed under ultrasound guidance and then upsized to a 5 [place] sheath over a Bentson wire.  An Omni Flush catheter was placed into the distal thoracic aorta and an aortogram was obtained which demonstrated an early endoleak on the anterolateral right side of the aorta.  I was unable to discern if this was coming from one of the renal stents or from the proximal stent edge so the Omni Flush catheter was pulled down into the graft just below the renal artery stents and another aortogram was obtained.  This continued to demonstrate the endoleak although contrast did not appear to make it up to the proximal edge of the  stent.  For that reason I elected to select the renal stents and evaluate for a 1C leak.  Over the Bentson wire the 5 [place] sheath was exchanged for a 6-1/2 [place] tour guide sheath and the patient was systemically heparinized.  The right renal artery stent was selected and multiple angiograms were obtained but there did not appear to be any leak on the proximal or distal edge of this right renal  artery stent.  The obliquity was then changed and the left renal artery stent was selected, via multiple angiograms it did appear that there may be a leak on the proximal stent edge, likely a separation from the renal stent in the fenestration.  A Glidewire was passed into the left renal artery and this was exchanged for a Rosen wire via a quick cross catheter and a 6 x 19 VBX stent was then placed attempting to bridge the gap of the possible stent/fenestration separation.  This was landed with appropriate overlap into the aorta.  Angiograms via a Omni Flush catheter continues to demonstrate a leak.  After multiple aortogram's myself and Dr. Christia Cowboy both believe that this could potentially be due to a fabric tear which appeared to be inferior to the renal stents and for that reason we elected to attempt to seal this with an aortic cuff.  The 6.5 [place] tour guide sheath was removed and 2 Pro-glide's were placed in orthogonal positions in a preclose fashion.  Over the [facility] wire a 16 [place] Gore dry seal sheath was placed into the aorta and a 28.5 mm Gore aortic cuff was deployed with the proximal edge just inferior to the renal fenestrations.  Completion angiograms unfortunately continued to demonstrate the leak and it appeared that this was either coming from proximal stent edge or a fabric tear in the posterior wall of the graft in the visceral fenestration portion.  Myself and Dr. Christia Cowboy both agreed that at this time we are unable to solve this issue as he would need a redo complex fenestrated repair versus a laser fenestrated.  The 16 French dry seal sheath was removed and the Pro-glide's knots were pushed down with excellent hemostasis.  We then turned the case over to general surgery.  For details on their portion, please see Dr. Elmo Haff operative report.     COMPLICATIONS: None apparent  CONDITION: Critical, being transferred to the ICU intubated and critically ill.  Philipp Brawn MD [facility] Phone Number: [phone] 06/28/2023 6:21 PM

## 2023-06-29 ENCOUNTER — Inpatient Hospital Stay (HOSPITAL_COMMUNITY)

## 2023-06-29 ENCOUNTER — Encounter (HOSPITAL_COMMUNITY): Payer: Self-pay | Admitting: Vascular Surgery

## 2023-06-29 DIAGNOSIS — T80219A Unspecified infection due to central venous catheter, initial encounter: Secondary | ICD-10-CM | POA: Diagnosis not present

## 2023-06-29 DIAGNOSIS — K56609 Unspecified intestinal obstruction, unspecified as to partial versus complete obstruction: Secondary | ICD-10-CM

## 2023-06-29 DIAGNOSIS — I38 Endocarditis, valve unspecified: Secondary | ICD-10-CM | POA: Diagnosis not present

## 2023-06-29 DIAGNOSIS — R7881 Bacteremia: Secondary | ICD-10-CM

## 2023-06-29 DIAGNOSIS — I6523 Occlusion and stenosis of bilateral carotid arteries: Secondary | ICD-10-CM

## 2023-06-29 DIAGNOSIS — I714 Abdominal aortic aneurysm, without rupture, unspecified: Secondary | ICD-10-CM

## 2023-06-29 DIAGNOSIS — E8721 Acute metabolic acidosis: Secondary | ICD-10-CM | POA: Diagnosis not present

## 2023-06-29 DIAGNOSIS — B9561 Methicillin susceptible Staphylococcus aureus infection as the cause of diseases classified elsewhere: Secondary | ICD-10-CM

## 2023-06-29 DIAGNOSIS — R579 Shock, unspecified: Secondary | ICD-10-CM | POA: Diagnosis not present

## 2023-06-29 DIAGNOSIS — A499 Bacterial infection, unspecified: Secondary | ICD-10-CM | POA: Diagnosis not present

## 2023-06-29 DIAGNOSIS — Z9889 Other specified postprocedural states: Secondary | ICD-10-CM

## 2023-06-29 LAB — POCT I-STAT 7, (LYTES, BLD GAS, ICA,H+H)
Acid-base deficit: 1 mmol/L (ref 0.0–2.0)
Bicarbonate: 23.6 mmol/L (ref 20.0–28.0)
Calcium, Ion: 0.97 mmol/L — ABNORMAL LOW (ref 1.15–1.40)
HCT: 24 % — ABNORMAL LOW (ref 39.0–52.0)
Hemoglobin: 8.2 g/dL — ABNORMAL LOW (ref 13.0–17.0)
O2 Saturation: 98 %
Potassium: 3.5 mmol/L (ref 3.5–5.1)
Sodium: 150 mmol/L — ABNORMAL HIGH (ref 135–145)
TCO2: 25 mmol/L (ref 22–32)
pCO2 arterial: 39.5 mmHg (ref 32–48)
pH, Arterial: 7.385 (ref 7.35–7.45)
pO2, Arterial: 112 mmHg — ABNORMAL HIGH (ref 83–108)

## 2023-06-29 LAB — TYPE AND SCREEN
ABO/RH(D): A POS
Antibody Screen: NEGATIVE
Unit division: 0
Unit division: 0

## 2023-06-29 LAB — BLOOD CULTURE ID PANEL (REFLEXED) - BCID2
A.calcoaceticus-baumannii: NOT DETECTED
Bacteroides fragilis: NOT DETECTED
CTX-M ESBL: NOT DETECTED
Candida albicans: NOT DETECTED
Candida auris: NOT DETECTED
Candida glabrata: NOT DETECTED
Candida krusei: NOT DETECTED
Candida parapsilosis: NOT DETECTED
Candida tropicalis: NOT DETECTED
Carbapenem resist OXA 48 LIKE: NOT DETECTED
Carbapenem resistance IMP: NOT DETECTED
Carbapenem resistance KPC: NOT DETECTED
Carbapenem resistance NDM: NOT DETECTED
Carbapenem resistance VIM: NOT DETECTED
Cryptococcus neoformans/gattii: NOT DETECTED
Enterobacter cloacae complex: NOT DETECTED
Enterobacterales: DETECTED — AB
Enterococcus Faecium: NOT DETECTED
Enterococcus faecalis: NOT DETECTED
Escherichia coli: DETECTED — AB
Haemophilus influenzae: NOT DETECTED
Klebsiella aerogenes: NOT DETECTED
Klebsiella oxytoca: NOT DETECTED
Klebsiella pneumoniae: NOT DETECTED
Listeria monocytogenes: NOT DETECTED
Meth resistant mecA/C and MREJ: DETECTED — AB
Neisseria meningitidis: NOT DETECTED
Proteus species: NOT DETECTED
Pseudomonas aeruginosa: NOT DETECTED
Salmonella species: NOT DETECTED
Serratia marcescens: NOT DETECTED
Staphylococcus aureus (BCID): DETECTED — AB
Staphylococcus epidermidis: NOT DETECTED
Staphylococcus lugdunensis: NOT DETECTED
Staphylococcus species: DETECTED — AB
Stenotrophomonas maltophilia: NOT DETECTED
Streptococcus agalactiae: NOT DETECTED
Streptococcus pneumoniae: NOT DETECTED
Streptococcus pyogenes: NOT DETECTED
Streptococcus species: NOT DETECTED

## 2023-06-29 LAB — BASIC METABOLIC PANEL WITH GFR
Anion gap: 7 (ref 5–15)
BUN: 61 mg/dL — ABNORMAL HIGH (ref 8–23)
CO2: 21 mmol/L — ABNORMAL LOW (ref 22–32)
Calcium: 6.6 mg/dL — ABNORMAL LOW (ref 8.9–10.3)
Chloride: 118 mmol/L — ABNORMAL HIGH (ref 98–111)
Creatinine, Ser: 1.39 mg/dL — ABNORMAL HIGH (ref 0.61–1.24)
GFR, Estimated: 51 mL/min — ABNORMAL LOW (ref >60)
Glucose, Bld: 179 mg/dL — ABNORMAL HIGH (ref 70–99)
Potassium: 3.6 mmol/L (ref 3.5–5.1)
Sodium: 146 mmol/L — ABNORMAL HIGH (ref 135–145)

## 2023-06-29 LAB — CBC
HCT: 23.8 % — ABNORMAL LOW (ref 39.0–52.0)
Hemoglobin: 8 g/dL — ABNORMAL LOW (ref 13.0–17.0)
MCH: 28.8 pg (ref 26.0–34.0)
MCHC: 33.6 g/dL (ref 30.0–36.0)
MCV: 85.6 fL (ref 80.0–100.0)
Platelets: 64 10*3/uL — ABNORMAL LOW (ref 150–400)
RBC: 2.78 MIL/uL — ABNORMAL LOW (ref 4.22–5.81)
RDW: 17.4 % — ABNORMAL HIGH (ref 11.5–15.5)
WBC: 7.1 10*3/uL (ref 4.0–10.5)
nRBC: 0 % (ref 0.0–0.2)

## 2023-06-29 LAB — GLUCOSE, CAPILLARY
Glucose-Capillary: 149 mg/dL — ABNORMAL HIGH (ref 70–99)
Glucose-Capillary: 174 mg/dL — ABNORMAL HIGH (ref 70–99)
Glucose-Capillary: 197 mg/dL — ABNORMAL HIGH (ref 70–99)
Glucose-Capillary: 197 mg/dL — ABNORMAL HIGH (ref 70–99)
Glucose-Capillary: 214 mg/dL — ABNORMAL HIGH (ref 70–99)
Glucose-Capillary: 221 mg/dL — ABNORMAL HIGH (ref 70–99)

## 2023-06-29 LAB — ECHOCARDIOGRAM COMPLETE
AR max vel: 1.37 cm2
AV Area VTI: 1.3 cm2
AV Area mean vel: 1.4 cm2
AV Mean grad: 9.5 mmHg
AV Peak grad: 18.5 mmHg
Ao pk vel: 2.15 m/s
Area-P 1/2: 2.3 cm2
Height: 66 in
MV VTI: 1 cm2
S' Lateral: 3.4 cm
Weight: 2010.6 [oz_av]

## 2023-06-29 LAB — BPAM RBC
Blood Product Expiration Date: 202504222359
Blood Product Expiration Date: 202505132359
ISSUE DATE / TIME: 202504151608
ISSUE DATE / TIME: 202504151608
Unit Type and Rh: 6200
Unit Type and Rh: 6200

## 2023-06-29 LAB — TRIGLYCERIDES: Triglycerides: 169 mg/dL — ABNORMAL HIGH (ref <150)

## 2023-06-29 LAB — MAGNESIUM
Magnesium: 2.1 mg/dL (ref 1.7–2.4)
Magnesium: 2.3 mg/dL (ref 1.7–2.4)

## 2023-06-29 LAB — PHOSPHORUS: Phosphorus: 3.9 mg/dL (ref 2.5–4.6)

## 2023-06-29 MED ORDER — FENTANYL BOLUS VIA INFUSION
25.0000 ug | INTRAVENOUS | Status: DC | PRN
  Administered 2023-06-29 – 2023-06-30 (×3): 25 ug via INTRAVENOUS
  Administered 2023-07-01: 50 ug via INTRAVENOUS
  Administered 2023-07-01: 25 ug via INTRAVENOUS
  Administered 2023-07-01: 50 ug via INTRAVENOUS

## 2023-06-29 MED ORDER — THIAMINE MONONITRATE 100 MG PO TABS
100.0000 mg | ORAL_TABLET | Freq: Every day | ORAL | Status: AC
  Administered 2023-06-29: 100 mg
  Filled 2023-06-29: qty 1

## 2023-06-29 MED ORDER — MIDAZOLAM HCL 2 MG/2ML IJ SOLN
2.0000 mg | Freq: Once | INTRAMUSCULAR | Status: AC
Start: 2023-06-29 — End: 2023-06-29
  Administered 2023-06-29: 2 mg via INTRAVENOUS

## 2023-06-29 MED ORDER — CHLORHEXIDINE GLUCONATE CLOTH 2 % EX PADS
6.0000 | MEDICATED_PAD | Freq: Every day | CUTANEOUS | Status: DC
  Administered 2023-07-01 – 2023-07-03 (×3): 6 via TOPICAL

## 2023-06-29 MED ORDER — TRAVASOL 10 % IV SOLN
INTRAVENOUS | Status: AC
  Filled 2023-06-29: qty 949.2

## 2023-06-29 MED ORDER — VANCOMYCIN HCL 10 G IV SOLR
1250.0000 mg | Freq: Once | INTRAVENOUS | Status: AC
  Administered 2023-06-29: 1250 mg via INTRAVENOUS
  Filled 2023-06-29: qty 12.5

## 2023-06-29 MED ORDER — POTASSIUM CHLORIDE 10 MEQ/50ML IV SOLN
10.0000 meq | INTRAVENOUS | Status: AC
  Administered 2023-06-29 (×2): 10 meq via INTRAVENOUS
  Filled 2023-06-29 (×2): qty 50

## 2023-06-29 MED ORDER — VANCOMYCIN HCL 500 MG/100ML IV SOLN: 500.0000 mg | INTRAVENOUS | Status: DC

## 2023-06-29 MED ORDER — THIAMINE HCL 100 MG/ML IJ SOLN: 100.0000 mg | Freq: Every day | INTRAMUSCULAR | Status: AC

## 2023-06-29 MED ORDER — DAPTOMYCIN-SODIUM CHLORIDE 500-0.9 MG/50ML-% IV SOLN
8.0000 mg/kg | Freq: Every day | INTRAVENOUS | Status: DC
Start: 2023-06-30 — End: 2023-07-03
  Administered 2023-06-30 – 2023-07-02 (×3): 500 mg via INTRAVENOUS
  Filled 2023-06-29 (×4): qty 50

## 2023-06-29 NOTE — Progress Notes (Addendum)
 Progress Note    06/29/2023 12:11 PM 1 Day Post-Op  Subjective:  intubated and sedated   Vitals:   06/29/23 1000 06/29/23 1102  BP:    Pulse: 61 62  Resp: (!) 26 (!) 26  Temp: 98.6 F (37 C) 98.6 F (37 C)  SpO2: 98% 97%   Physical Exam: Lungs:  mechanical ventilation Incisions:  groin incisions c/d/I without hematoma Extremities:  feet warm to touch Neurologic: sedated  CBC    Component Value Date/Time   WBC 7.1 06/29/2023 0557   RBC 2.78 (L) 06/29/2023 0557   HGB 8.2 (L) 06/29/2023 0906   HGB 10.9 (L) 05/25/2023 1032   HCT 24.0 (L) 06/29/2023 0906   PLT 64 (L) 06/29/2023 0557   PLT 303 05/25/2023 1032   MCV 85.6 06/29/2023 0557   MCH 28.8 06/29/2023 0557   MCHC 33.6 06/29/2023 0557   RDW 17.4 (H) 06/29/2023 0557   LYMPHSABS 0.3 (L) 06/20/2023 0726   MONOABS 1.3 (H) 06/20/2023 0726   EOSABS 0.0 06/20/2023 0726   BASOSABS 0.1 06/20/2023 0726    BMET    Component Value Date/Time   NA 150 (H) 06/29/2023 0906   NA 138 08/18/2018 1135   K 3.5 06/29/2023 0906   CL 118 (H) 06/29/2023 0557   CO2 21 (L) 06/29/2023 0557   GLUCOSE 179 (H) 06/29/2023 0557   BUN 61 (H) 06/29/2023 0557   BUN 19 08/18/2018 1135   CREATININE 1.39 (H) 06/29/2023 0557   CREATININE 1.11 10/09/2021 1646   CALCIUM 6.6 (L) 06/29/2023 0557   GFRNONAA 51 (L) 06/29/2023 0557   GFRAA 95 08/18/2018 1135    INR    Component Value Date/Time   INR 1.4 (H) 06/28/2023 1911     Intake/Output Summary (Last 24 hours) at 06/29/2023 1211 Last data filed at 06/29/2023 1015 Gross per 24 hour  Intake 8442.38 ml  Output 2360 ml  Net 6082.38 ml     Assessment/Plan:  83 y.o. male is s/p aortogram with left renal stent and aortic cuff 1 Day Post-Op   Patient remains critically ill this morning Groin incisions are well appearing Despite L renal stent and proximal aortic cuff, completion aortogram demonstrated persistent leak from proximal edge vs posterior wall fabric of stent graft.  Fabric  tear is concerning for graft infection.  Per Dr. Hetty Blend patient would require redo complex fenestrated repair vs laser fenestrated repair to fix leak.  Dr. Karin Lieu will evaluate the patient later today and provide further treatment plans.   Emilie Rutter, PA-C [facility] [phone] 06/29/2023 12:11 PM  VASCULAR STAFF ADDENDUM: I have independently interviewed and examined the patient. I agree with the above.  Patient remains critical. I had a long conversation with the patient's wife today at bedside.  Yesterday we tried to have him transferred to [facility] due to the ongoing aortic endoleak.  After discussing it with their team, the decision was made to continue care here and if he improves and can transition to the outpatient setting, they are happy to see him back.  Overall, I think that there are several problems when he is battling.  1 of those problems has been improved significantly with bowel resection.  I appreciate general surgery's involvement during the case yesterday. When it comes to his aneurysm, the rapid growth of 2 cm over a week is extremely abnormal.  I am very concerned that the aorta and the repair are infected.  Regardless, should he improve to the floor status status  post colectomy, we would consider repeat imaging and high risk intervention.  At this time, I do not think he would have significant benefit with return to the OR, and that would likely result mortality.  The revision itself will have a high mortality and will likely lead to renal failure.  [name] understands that he is critical, and that should he decompensate, we would not offer further surgery at this time. Will continue to see on a daily basis.  Likely scan in 1 week's time pending overall clinical status.  Kayla Part MD [facility] Phone Number: [phone] 06/29/2023 6:13 PM

## 2023-06-29 NOTE — Progress Notes (Signed)
 PHARMACY - PHYSICIAN COMMUNICATION CRITICAL VALUE ALERT - BLOOD CULTURE IDENTIFICATION (BCID)  Erik Borba. is an 83 y.o. male who presented to [facility] on 06/19/2023 with ischemic colitis s/p right colectomy in post-op shock.  Assessment:  3/3 blood cultures MRSA and 1/3 blood cultures E. coli  Name of physician (or Provider) Contacted: Dr. Rito Chess  Current antibiotics: Zosyn 3.375gm IV every 8 hours   Changes to prescribed antibiotics recommended:   -Vancomycin 1250mg  IV x1 -Vancomycin 500mg  IV every 24 hours (AUC 402, Vd 0.72, TBW, Cmin 11.5) -Follow up repeat blood cultures and ECHO  Results for orders placed or performed during the hospital encounter of 06/19/23  Blood Culture ID Panel (Reflexed) (Collected: 06/28/2023  9:16 AM)  Result Value Ref Range   Enterococcus faecalis NOT DETECTED NOT DETECTED   Enterococcus Faecium NOT DETECTED NOT DETECTED   Listeria monocytogenes NOT DETECTED NOT DETECTED   Staphylococcus species DETECTED (A) NOT DETECTED   Staphylococcus aureus (BCID) DETECTED (A) NOT DETECTED   Staphylococcus epidermidis NOT DETECTED NOT DETECTED   Staphylococcus lugdunensis NOT DETECTED NOT DETECTED   Streptococcus species NOT DETECTED NOT DETECTED   Streptococcus agalactiae NOT DETECTED NOT DETECTED   Streptococcus pneumoniae NOT DETECTED NOT DETECTED   Streptococcus pyogenes NOT DETECTED NOT DETECTED   A.calcoaceticus-baumannii NOT DETECTED NOT DETECTED   Bacteroides fragilis NOT DETECTED NOT DETECTED   Enterobacterales DETECTED (A) NOT DETECTED   Enterobacter cloacae complex NOT DETECTED NOT DETECTED   Escherichia coli DETECTED (A) NOT DETECTED   Klebsiella aerogenes NOT DETECTED NOT DETECTED   Klebsiella oxytoca NOT DETECTED NOT DETECTED   Klebsiella pneumoniae NOT DETECTED NOT DETECTED   Proteus species NOT DETECTED NOT DETECTED   Salmonella species NOT DETECTED NOT DETECTED   Serratia marcescens NOT DETECTED NOT DETECTED   Haemophilus  influenzae NOT DETECTED NOT DETECTED   Neisseria meningitidis NOT DETECTED NOT DETECTED   Pseudomonas aeruginosa NOT DETECTED NOT DETECTED   Stenotrophomonas maltophilia NOT DETECTED NOT DETECTED   Candida albicans NOT DETECTED NOT DETECTED   Candida auris NOT DETECTED NOT DETECTED   Candida glabrata NOT DETECTED NOT DETECTED   Candida krusei NOT DETECTED NOT DETECTED   Candida parapsilosis NOT DETECTED NOT DETECTED   Candida tropicalis NOT DETECTED NOT DETECTED   Cryptococcus neoformans/gattii NOT DETECTED NOT DETECTED   CTX-M ESBL NOT DETECTED NOT DETECTED   Carbapenem resistance IMP NOT DETECTED NOT DETECTED   Carbapenem resistance KPC NOT DETECTED NOT DETECTED   Meth resistant mecA/C and MREJ DETECTED (A) NOT DETECTED   Carbapenem resistance NDM NOT DETECTED NOT DETECTED   Carbapenem resist OXA 48 LIKE NOT DETECTED NOT DETECTED   Carbapenem resistance VIM NOT DETECTED NOT DETECTED    Young Hensen, PharmD. Clinical Pharmacist 06/29/2023 12:51 AM

## 2023-06-29 NOTE — Consult Note (Signed)
 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 Sp SMA stent for mesenteric ischemia and L rennal and aortic artery stent COPD   Plan:  Will exchange vancomycin for daptomycin and continue Zosyn for now to provide broad abdominal coverage Followup sensis on E coli, MRSA and send latter out for dapto S Agree with replacing PICC line and placing new central line He will need a central line "holiday" when and if this is feasible to affect cure 2D echocardiogram will consider TEE Repeat blood cultures and will repeat them again after he has a "central line holiday Would prefer in the context of his bacteremia do not receive TPN though my feelings on this or not as emphatic as they are when someone has fungemia Contact precautions for MRSA  Principal Problem:   Polymicrobial bacterial infection Active Problems:   Hyperlipidemia   Bilateral carotid artery stenosis   COPD, mild (HCC)   BPH (benign prostatic hyperplasia)   Family history of coronary artery disease in brother   CAD S/P percutaneous coronary angioplasty   Chronic systolic heart failure (HCC)   HTN (hypertension)   BPH with obstruction/lower urinary tract symptoms   Abdominal aortic aneurysm (AAA) (HCC)   Restless leg syndrome   MSSA bacteremia   Abdominal pain   Anemia   SBO (small bowel obstruction) (HCC)   S/P exploratory laparotomy   E coli bacteremia   PICC line infection   Scheduled Meds:  arformoterol  15 mcg Nebulization BID   budesonide (PULMICORT) nebulizer solution  0.25 mg Nebulization BID   Chlorhexidine Gluconate Cloth  6 each Topical Daily   docusate  100 mg Per Tube BID    insulin aspart  0-9 Units Subcutaneous Q4H   mupirocin ointment  1 Application Nasal BID   mouth rinse  15 mL Mouth Rinse Q2H   polyethylene glycol  17 g Per Tube Daily   revefenacin  175 mcg Nebulization Daily   sodium chloride flush  10-40 mL Intracatheter Q12H   sodium chloride flush  3 mL Intravenous Q12H   Continuous Infusions:  [START ON 06/30/2023] DAPTOmycin     fentaNYL infusion INTRAVENOUS 50 mcg/hr (06/29/23 1600)   norepinephrine (LEVOPHED) Adult infusion 1 mcg/min (06/29/23 1600)   piperacillin-tazobactam (ZOSYN)  IV 12.5 mL/hr at 06/29/23 1600   propofol (DIPRIVAN) infusion 25 mcg/kg/min (06/29/23 1600)   sodium bicarbonate 150 mEq in sterile water 1,150 mL infusion 50 mL/hr at 06/29/23 1600   TPN ADULT (ION) 60 mL/hr at 06/29/23 1600   TPN ADULT (ION)     vasopressin 0.04 Units/min (06/29/23 1600)   PRN Meds:.fentaNYL, Gerhardt's butt cream, labetalol, ondansetron (ZOFRAN) IV, mouth rinse, mouth rinse, sodium chloride flush, sodium chloride flush  HPI: Erik [name]. is a 83 y.o. male with past medical history significant for coronary artery disease collagen-vascular disease COPD hyperlipidemia hypertension ischemic cardiomyopathy who came to the ER on the seventh with severe abdominal pain.  Initial CT scan showed early small bowel obstruction with stenosis of the celiac and SMA.  He was seen by vascular underwent SMA stent for acute mesenteric ischemia.  He  continued abdominal pain was found to have an endoleak.  He ultimately taken to the operating room yesterday and underwent exploratory laparotomy which revealed necrotic right colon and he underwent right colectomy with ostomy creation vascular surgery also involved in place of left renal artery and aortic artery stent.  He was in shock and required pressors.  Blood cultures were taken yesterday as well and these have yielded MRSA in 2 of 2 cultures taken as well as E. coli in one of the 2.  Had central line  placed before and currently has a PICC line.  I would suspect that his MRSA bacteremia is likely nosocomial acquired and associated with his PICC line and/or central line that was placed previously.  Currently he is still critically ill and on pressors. He is currently on vancomycin and Zosyn.  We will exchange the vancomycin to daptomycin for better renal safety.  My understanding is PICC line 20 removed and new central line placed today.  Ultimately when feasible he should have a "line holiday.  I have ordered a 2D echocardiogram.  I will also order repeat blood cultures though he will need repeat blood cultures ultimately after he has a line holiday.  Hopefully he improves and can wake up and we can interview and examine him for evidence of any metastatic infection though I am hopeful that this is really related to his line and that this bacteremia can be promptly treated.  Note we do typically like to hold TPN in the setting of active infection.  In the context of fungemia it is particularly important.  Will defer to primary team regarding this matter though.  I have personally spent 80 minutes involved in face-to-face and non-face-to-face activities for this patient on the day of the visit. Professional time spent includes the following activities: Preparing to see the patient (review of tests), Obtaining and/or reviewing separately obtained history (admission/discharge record), Performing a medically appropriate examination and/or evaluation , Ordering medications/tests/procedures, referring and communicating with other health care professionals, Documenting clinical information in the EMR, Independently interpreting results (not separately reported), Communicating results to the patient/family/caregiver, Counseling and educating the patient/family/caregiver and Care coordination (not separately reported).   Evaluation of the patient requires complex antimicrobial therapy evaluation,  counseling , isolation needs to reduce disease transmission and risk assessment and mitigation.   Review of Systems: Review of Systems  Unable to perform ROS: Critical illness    Past Medical History:  Diagnosis Date   Adenomatous colon polyp    Aortic aneurysm (HCC)    BPH (benign prostatic hypertrophy)    CAD (coronary artery disease)    Carotid artery stenosis    a. Bilateral CEA   Cataract    CHF (congestive heart failure) (HCC)    Chronic systolic heart failure (HCC)    a. EF 20-25%, mild LVH, mod HK, mid apicalanteroseptal myocardium, mild MR, LA mod dilated   Collagen vascular disease (HCC)    COPD (chronic obstructive pulmonary disease) (HCC)    Coronary artery disease    a. LHC (08/2013): Lmain: short 30% distal, LAD: sml D1 & D2, 70% ostial D1, 95-99% LAD stenosis prox D2 LCx: sml/mod ramus subtot. occluded, 40% ostial set off lg OM1, 40% AV LCx after OM1, RCA: 90% prox (DES to RCA and prox LAD)   Diverticulosis    GERD (gastroesophageal reflux disease)    Heart murmur    History of colonic polyps    Hyperlipidemia    Hyperplastic colon polyp  Hypertension    Ischemic cardiomyopathy    RLS (restless legs syndrome)     Social History   Tobacco Use   Smoking status: Every Day    Current packs/day: 1.00    Average packs/day: 1 pack/day for 50.0 years (50.0 ttl pk-yrs)    Types: Cigarettes    Passive exposure: Never   Smokeless tobacco: Current   Tobacco comments:    off and on since age 72, currently smokes a pack a day [facility] 12/23/20  Vaping Use   Vaping status: Never Used  Substance Use Topics   Alcohol use: No   Drug use: No    Family History  Problem Relation Age of Onset   Heart disease Brother    Hyperlipidemia Brother    Pancreatic cancer Brother    Hypertension Mother    Cancer Father        unknown type; sounds GI   Emphysema Father    Hyperlipidemia Brother    Hyperlipidemia Brother    Heart attack Brother    Hyperlipidemia Brother     Multiple sclerosis Brother    Kidney disease Neg Hx    Prostate cancer Neg Hx    Colon cancer Neg Hx    Esophageal cancer Neg Hx    Rectal cancer Neg Hx    Stomach cancer Neg Hx    Allergies  Allergen Reactions   Codeine Anaphylaxis and Swelling    throat swells   Heparin Other (See Comments)    R/O HIT; Patient's wife stated that heparin was not an allergy of the patient; She is unaware of a heparin allergy    OBJECTIVE: Blood pressure (!) 145/55, pulse 60, temperature 98.6 F (37 C), resp. rate (!) 26, height 5\' 6"  (1.676 m), weight 57 kg, SpO2 98%.  Physical Exam Constitutional:      Interventions: He is intubated.  HENT:     Head: Normocephalic and atraumatic.  Eyes:     General:        Right eye: No discharge.        Left eye: No discharge.     Extraocular Movements: Extraocular movements intact.  Cardiovascular:     Rate and Rhythm: Tachycardia present.     Heart sounds: No murmur heard.    No friction rub. No gallop.  Pulmonary:     Effort: He is intubated.  Abdominal:     General: There is no distension.  Skin:    General: Skin is warm and dry.  Neurological:     General: No focal deficit present.     Lab Results Lab Results  Component Value Date   WBC 7.1 06/29/2023   HGB 8.2 (L) 06/29/2023   HCT 24.0 (L) 06/29/2023   MCV 85.6 06/29/2023   PLT 64 (L) 06/29/2023    Lab Results  Component Value Date   CREATININE 1.39 (H) 06/29/2023   BUN 61 (H) 06/29/2023   NA 150 (H) 06/29/2023   K 3.5 06/29/2023   CL 118 (H) 06/29/2023   CO2 21 (L) 06/29/2023    Lab Results  Component Value Date   ALT 80 (H) 06/27/2023   AST 43 (H) 06/27/2023   ALKPHOS 41 06/27/2023   BILITOT 1.1 06/27/2023     Microbiology: Recent Results (from the past 240 hours)  MRSA Next Gen by PCR, Nasal     Status: Abnormal   Collection Time: 06/25/23  9:59 PM   Specimen: Nasal Mucosa; Nasal Swab  Result Value Ref Range Status  MRSA by PCR Next Gen DETECTED (A) NOT DETECTED  Final    Comment: RESULT CALLED TO, READ BACK BY AND VERIFIED WITH: B WARNER,RN@[id number]  06/26/23 MK (NOTE) The GeneXpert MRSA Assay (FDA approved for NASAL specimens only), is one component of a comprehensive MRSA colonization surveillance program. It is not intended to diagnose MRSA infection nor to guide or monitor treatment for MRSA infections. Test performance is not FDA approved in patients less than 34 years old. Performed at [facility], [address] [place], [place] [address]   Culture, blood (Routine X 2) w Reflex to ID Panel     Status: None (Preliminary result)   Collection Time: 06/28/23  9:16 AM   Specimen: BLOOD  Result Value Ref Range Status   Specimen Description BLOOD SITE NOT SPECIFIED  Final   Special Requests   Final    BOTTLES DRAWN AEROBIC AND ANAEROBIC Blood Culture results may not be optimal due to an inadequate volume of blood received in culture bottles   Culture  Setup Time   Final    GRAM POSITIVE COCCI IN CLUSTERS IN BOTH AEROBIC AND ANAEROBIC BOTTLES CRITICAL RESULT CALLED TO, READ BACK BY AND VERIFIED WITH: J [facility]  06/29/23 MK Performed at [facility], [address], [place], [place] [address]    Culture GRAM POSITIVE COCCI  Final   Report Status PENDING  Incomplete  Culture, blood (Routine X 2) w Reflex to ID Panel     Status: None (Preliminary result)   Collection Time: 06/28/23  9:16 AM   Specimen: BLOOD  Result Value Ref Range Status   Specimen Description BLOOD SITE NOT SPECIFIED  Final   Special Requests   Final    BOTTLES DRAWN AEROBIC AND ANAEROBIC Blood Culture results may not be optimal due to an inadequate volume of blood received in culture bottles   Culture  Setup Time   Final    GRAM POSITIVE COCCI IN CLUSTERS GRAM NEGATIVE RODS IN BOTH AEROBIC AND ANAEROBIC BOTTLES CRITICAL RESULT CALLED TO, READ BACK BY AND VERIFIED WITH: J [facility]  06/29/23 MK    Culture   Final    GRAM POSITIVE COCCI GRAM  NEGATIVE RODS CULTURE REINCUBATED FOR BETTER GROWTH Performed at [facility], [address] [place], [place] [address]    Report Status PENDING  Incomplete  Blood Culture ID Panel (Reflexed)     Status: Abnormal   Collection Time: 06/28/23  9:16 AM  Result Value Ref Range Status   Enterococcus faecalis NOT DETECTED NOT DETECTED Final   Enterococcus Faecium NOT DETECTED NOT DETECTED Final   Listeria monocytogenes NOT DETECTED NOT DETECTED Final   Staphylococcus species DETECTED (A) NOT DETECTED Final    Comment: CRITICAL RESULT CALLED TO, READ BACK BY AND VERIFIED WITH: J WYLAND,PHARMD@0046  06/29/23 MK    Staphylococcus aureus (BCID) DETECTED (A) NOT DETECTED Final    Comment: Methicillin (oxacillin)-resistant Staphylococcus aureus (MRSA). MRSA is predictably resistant to beta-lactam antibiotics (except ceftaroline). Preferred therapy is vancomycin unless clinically contraindicated. Patient requires contact precautions if  hospitalized. CRITICAL RESULT CALLED TO, READ BACK BY AND VERIFIED WITH: J WYLAND,PHARMD@0046  06/29/23 MK    Staphylococcus epidermidis NOT DETECTED NOT DETECTED Final   Staphylococcus lugdunensis NOT DETECTED NOT DETECTED Final   Streptococcus species NOT DETECTED NOT DETECTED Final   Streptococcus agalactiae NOT DETECTED NOT DETECTED Final   Streptococcus pneumoniae NOT DETECTED NOT DETECTED Final   Streptococcus pyogenes NOT DETECTED NOT DETECTED Final   A.calcoaceticus-baumannii NOT DETECTED NOT  DETECTED Final   Bacteroides fragilis NOT DETECTED NOT DETECTED Final   Enterobacterales DETECTED (A) NOT DETECTED Final    Comment: Enterobacterales represent a large order of gram negative bacteria, not a single organism. CRITICAL RESULT CALLED TO, READ BACK BY AND VERIFIED WITH:  J [facility]  06/29/23 MK    Enterobacter cloacae complex NOT DETECTED NOT DETECTED Final   Escherichia coli DETECTED (A) NOT DETECTED Final    Comment: CRITICAL RESULT  CALLED TO, READ BACK BY AND VERIFIED WITH:  J WYLAND,PHARMD@0046  06/29/23 MK    Klebsiella aerogenes NOT DETECTED NOT DETECTED Final   Klebsiella oxytoca NOT DETECTED NOT DETECTED Final   Klebsiella pneumoniae NOT DETECTED NOT DETECTED Final   Proteus species NOT DETECTED NOT DETECTED Final   Salmonella species NOT DETECTED NOT DETECTED Final   Serratia marcescens NOT DETECTED NOT DETECTED Final   Haemophilus influenzae NOT DETECTED NOT DETECTED Final   Neisseria meningitidis NOT DETECTED NOT DETECTED Final   Pseudomonas aeruginosa NOT DETECTED NOT DETECTED Final   Stenotrophomonas maltophilia NOT DETECTED NOT DETECTED Final   Candida albicans NOT DETECTED NOT DETECTED Final   Candida auris NOT DETECTED NOT DETECTED Final   Candida glabrata NOT DETECTED NOT DETECTED Final   Candida krusei NOT DETECTED NOT DETECTED Final   Candida parapsilosis NOT DETECTED NOT DETECTED Final   Candida tropicalis NOT DETECTED NOT DETECTED Final   Cryptococcus neoformans/gattii NOT DETECTED NOT DETECTED Final   CTX-M ESBL NOT DETECTED NOT DETECTED Final   Carbapenem resistance IMP NOT DETECTED NOT DETECTED Final   Carbapenem resistance KPC NOT DETECTED NOT DETECTED Final   Meth resistant mecA/C and MREJ DETECTED (A) NOT DETECTED Final    Comment: CRITICAL RESULT CALLED TO, READ BACK BY AND VERIFIED WITH:  J WYLAND,PHARMD@0046  06/29/23 MK    Carbapenem resistance NDM NOT DETECTED NOT DETECTED Final   Carbapenem resist OXA 48 LIKE NOT DETECTED NOT DETECTED Final   Carbapenem resistance VIM NOT DETECTED NOT DETECTED Final    Comment: Performed at [facility], [address], [place], [place] [address]  MRSA Next Gen by PCR, Nasal     Status: Abnormal   Collection Time: 06/28/23  8:52 PM   Specimen: Nasal Mucosa; Nasal Swab  Result Value Ref Range Status   MRSA by PCR Next Gen DETECTED (A) NOT DETECTED Final    Comment: RESULT CALLED TO, READ BACK BY AND VERIFIED WITH: RN Rande Brunt [phone] @  2302 FH (NOTE) The GeneXpert MRSA Assay (FDA approved for NASAL specimens only), is one component of a comprehensive MRSA colonization surveillance program. It is not intended to diagnose MRSA infection nor to guide or monitor treatment for MRSA infections. Test performance is not FDA approved in patients less than 15 years old. Performed at [facility], [address] [place], [place] [address]   Culture, blood (Routine X 2) w Reflex to ID Panel     Status: None (Preliminary result)   Collection Time: 06/29/23  5:58 AM   Specimen: BLOOD LEFT ARM  Result Value Ref Range Status   Specimen Description BLOOD LEFT ARM  Final   Special Requests   Final    BOTTLES DRAWN AEROBIC AND ANAEROBIC Blood Culture results may not be optimal due to an inadequate volume of blood received in culture bottles   Culture   Final    NO GROWTH < 12 HOURS Performed at [facility], [address], [place], [place] [address]    Report Status  PENDING  Incomplete  Culture, blood (Routine X 2) w Reflex to ID Panel     Status: None (Preliminary result)   Collection Time: 06/29/23  6:00 AM   Specimen: BLOOD LEFT ARM  Result Value Ref Range Status   Specimen Description BLOOD LEFT ARM  Final   Special Requests   Final    BOTTLES DRAWN AEROBIC AND ANAEROBIC Blood Culture adequate volume   Culture   Final    NO GROWTH < 12 HOURS Performed at [facility], [address], [place], [place] [address]    Report Status PENDING  Incomplete  Culture, blood (Routine X 2) w Reflex to ID Panel     Status: None (Preliminary result)   Collection Time: 06/29/23 11:17 AM   Specimen: BLOOD RIGHT ARM  Result Value Ref Range Status   Specimen Description BLOOD RIGHT ARM  Final   Special Requests   Final    BOTTLES DRAWN AEROBIC AND ANAEROBIC Blood Culture adequate volume Performed at [facility], [address], [place], [place] [address]    Culture PENDING  Incomplete   Report Status PENDING   Incomplete    Sheela Denmark, MD [facility] [phone] pager  06/29/2023, 5:04 PM

## 2023-06-29 NOTE — Progress Notes (Signed)
 NAME:  Erik Strother., MRN:  [id number], DOB:  06-15-1940, LOS: 9 ADMISSION DATE:  06/19/2023, CONSULTATION DATE:  06/29/23 REFERRING MD:  Rito Ehrlich, CHIEF COMPLAINT:   hypotension  History of Present Illness:  83 y.o. M with PMH significant for CAD, HFrEF, collagen vascular disease, COPD, HL, HTN, ischemic cardiomyopathy who presented to the ED on 4/7 with abdominal pain.  His initial CT showed early SBO with stenosis of the celiac and SMA.  He was seen by vascular and underwent an SMA stent for acute mesenteric ischemia on 4/7, he continued to have abdominal pain and found to have an endoleak, this was discussed with general surgery and he was tken to the OR for an ex lap which revealed a necrotic R colon. R colectomy was done with ostomy formation.  Vascular surgery also placed a L renal artery and aortic artery stent.  He required pressors and was left intubated so PCCM consulted for admission  Pertinent  Medical History   has a past medical history of Adenomatous colon polyp, Aortic aneurysm (HCC), BPH (benign prostatic hypertrophy), CAD (coronary artery disease), Carotid artery stenosis, Cataract, CHF (congestive heart failure) (HCC), Chronic systolic heart failure (HCC), Collagen vascular disease (HCC), COPD (chronic obstructive pulmonary disease) (HCC), Coronary artery disease, Diverticulosis, GERD (gastroesophageal reflux disease), Heart murmur, History of colonic polyps, Hyperlipidemia, Hyperplastic colon polyp, Hypertension, Ischemic cardiomyopathy, and RLS (restless legs syndrome).   Significant Hospital Events: Including procedures, antibiotic start and stop dates in addition to other pertinent events   4/7 admit, SMA stent  4/15 worsening pain, endoleak, back to OR for ex lap, ostomy placement   Interim History / Subjective:   Patient remained intubated overnight Sedated on prop/fentanyl Levo / Vaso 0.04  Blood cultures growing staph aureus and e. Coli. He is on vanc +  zosyn  Objective   Blood pressure (!) 141/54, pulse 62, temperature 98.6 F (37 C), resp. rate (!) 21, height 5\' 6"  (1.676 m), weight 57 kg, SpO2 97%.    Vent Mode: PRVC FiO2 (%):  [40 %-100 %] 40 % Set Rate:  [26 bmp] 26 bmp Vt Set:  [510 mL] 510 mL PEEP:  [5 cmH20] 5 cmH20 Plateau Pressure:  [18 cmH20-22 cmH20] 18 cmH20   Intake/Output Summary (Last 24 hours) at 06/29/2023 0840 Last data filed at 06/29/2023 0700 Gross per 24 hour  Intake 7940.86 ml  Output 2170 ml  Net 5770.86 ml   Filed Weights   06/27/23 0500 06/28/23 0500 06/29/23 0500  Weight: 57.6 kg 56.4 kg 57 kg    General:  chronically ill elderly male, intubated/sedated HEENT: ETT in place, sclera anicteric Neuro: heavily sedated, PERRL CV: rrr, no murmurs PULM:  course breath sounds GI: ostomy and rectal tube and cortrak and NGT and JP drain, midline incision dressed Extremities: warm/dry, trace edema  Skin: dry  Labs reviewed  Resolved Hospital Problem list   N/A  Assessment & Plan:  Ischemic colitis s/p right colectomy Shock state: combination septic/distributive, volume shifts, ABLA Aspiration pneumonitis, increased WOB- in part due to acidemia as well Postoperative vent management Acute kidney injury Mixed Metabolic/respiratory Acidosis Hypernatremia Severe protein calorie malnutrition PVD Anemia Thrombocytopenia CAD with prior PCI Prior AAA repair  - TPN - Will need new central line due to bacteremia - Levo/vaso for MAP 65 - Aggressive fluid repletion - Usual transfusion thresholds - Bicarb gtt, reduce rate to 59mL/hr - check ABG this AM - Zosyn + Vancomycin for bacteremia/intraabdominal coverage - PAD/VAP bundle - Guarded  prognosis  Best Practice (right click and "Reselect all SmartList Selections" daily)   Diet/type: TPN DVT prophylaxis not indicated GI prophylaxis: PPI Lines: Central line Foley:  Yes, and it is still needed Code Status:  full code Last date of  multidisciplinary goals of care discussion [Will discuss with family today]  Labs   CBC: Recent Labs  Lab 06/26/23 0428 06/27/23 0515 06/28/23 0554 06/28/23 1530 06/28/23 1624 06/28/23 1742 06/28/23 1911 06/28/23 1942 06/29/23 0557  WBC 7.3 8.6 11.8*  --   --   --  9.6  --  7.1  HGB 9.1* 8.7* 8.4*   < > 11.2* 8.5* 10.0* 9.9* 8.0*  HCT 27.5* 27.0* 25.5*   < > 33.0* 25.0* 30.9* 29.0* 23.8*  MCV 84.4 85.7 86.1  --   --   --  87.8  --  85.6  PLT 52* 63* 79*  --   --   --  88*  84*  --  64*   < > = values in this interval not displayed.    Basic Metabolic Panel: Recent Labs  Lab 06/25/23 0430 06/25/23 1640 06/26/23 0428 06/26/23 1705 06/27/23 0515 06/27/23 1815 06/28/23 0554 06/28/23 1530 06/28/23 1624 06/28/23 1742 06/28/23 1911 06/28/23 1942 06/29/23 0557  NA 156* 152*   < > 153* 155*  --  150*   < > 154* 152* 150* 153* 146*  K 2.9* 3.0*   < > 3.2* 4.2  --  4.4   < > 4.3 5.1 4.9 4.8 3.6  CL 123* 122*   < > 126* 127*  --  125*  --   --   --  125*  --  118*  CO2 23 25   < > 20* 19*  --  17*  --   --   --  20*  --  21*  GLUCOSE 145* 138*   < > 147* 168*  --  176*  --   --   --  166*  --  179*  BUN 85* 72*   < > 55* 55*  --  58*  --   --   --  62*  --  61*  CREATININE 1.59* 1.28*   < > 1.14 1.26*  --  1.37*  --   --   --  1.44*  --  1.39*  CALCIUM 8.3* 7.9*   < > 8.0* 7.7*  --  7.8*  --   --   --  6.9*  --  6.6*  MG 2.2 2.0   < > 2.5* 2.5* 2.3 2.3  --   --   --  2.2  --  2.1  PHOS 1.2* 3.6  --   --  2.5  --  2.8  --   --   --   --   --  3.9   < > = values in this interval not displayed.   GFR: Estimated Creatinine Clearance: 33 mL/min (A) (by C-G formula based on SCr of 1.39 mg/dL (H)). Recent Labs  Lab 06/27/23 0515 06/28/23 0554 06/28/23 0916 06/28/23 1911 06/28/23 2214 06/29/23 0557  PROCALCITON  --   --  7.28  --   --   --   WBC 8.6 11.8*  --  9.6  --  7.1  LATICACIDVEN  --   --  1.4 1.0 1.3  --     Liver Function Tests: Recent Labs  Lab  06/22/23 1700 06/23/23 0500 06/25/23 0430 06/25/23 1640 06/27/23 0515  AST  --  265* 107*  --  43*  ALT  --  334* 205*  --  80*  ALKPHOS  --  44 35*  --  41  BILITOT  --  1.1 0.7  --  1.1  PROT  --  6.2* 5.1*  --  4.8*  ALBUMIN 1.9* 2.0* 1.7* 1.5* <1.5*   No results for input(s): "LIPASE", "AMYLASE" in the last 168 hours. No results for input(s): "AMMONIA" in the last 168 hours.  ABG    Component Value Date/Time   PHART 7.199 (LL) 06/28/2023 1942   PCO2ART 55.6 (H) 06/28/2023 1942   PO2ART 334 (H) 06/28/2023 1942   HCO3 21.8 06/28/2023 1942   TCO2 23 06/28/2023 1942   ACIDBASEDEF 6.0 (H) 06/28/2023 1942   O2SAT 100 06/28/2023 1942     Coagulation Profile: Recent Labs  Lab 06/28/23 1911  INR 1.4*    Cardiac Enzymes: No results for input(s): "CKTOTAL", "CKMB", "CKMBINDEX", "TROPONINI" in the last 168 hours.  HbA1C: Hgb A1c MFr Bld  Date/Time Value Ref Range Status  06/23/2023 10:55 AM 5.6 4.8 - 5.6 % Final    Comment:    (NOTE)         Prediabetes: 5.7 - 6.4         Diabetes: >6.4         Glycemic control for adults with diabetes: <7.0   08/19/2022 08:19 AM 4.5 (L) 4.6 - 6.5 % Final    Comment:    Glycemic Control Guidelines for People with Diabetes:Non Diabetic:  <6%Goal of Therapy: <7%Additional Action Suggested:  >8%     CBG: Recent Labs  Lab 06/28/23 1220 06/28/23 2006 06/28/23 2354 06/29/23 0358 06/29/23 0807  GLUCAP 142* 167* 191* 197* 197*     Critical care time: 40 minutes    Duaine German, MD [facility] Office: [phone]   See Amion for personal pager PCCM on call pager [phone] until 7pm. Please call Elink 7p-7a. [phone]

## 2023-06-29 NOTE — Progress Notes (Signed)
 1 Day Post-Op   Subjective/Chief Complaint: Intubated and sedated   Objective: Vital signs in last 24 hours: Temp:  [97.9 F (36.6 C)-99.3 F (37.4 C)] 98.6 F (37 C) (04/16 0800) Pulse Rate:  [62-90] 64 (04/16 0800) Resp:  [17-28] 26 (04/16 0800) BP: (93-145)/(37-55) 145/55 (04/16 0700) SpO2:  [90 %-100 %] 97 % (04/16 0800) Arterial Line BP: (104-162)/(37-71) 139/40 (04/16 0800) FiO2 (%):  [40 %-100 %] 40 % (04/16 0800) Weight:  [57 kg] 57 kg (04/16 0500) Last BM Date : 06/29/23  Intake/Output from previous day: 04/15 0701 - 04/16 0700 In: 8356.9 [I.V.:6024.6; Blood:630; NG/GT:229.5; IV Piggyback:1472.7] Out: 2170 [Urine:1820; Stool:300; Blood:50] Intake/Output this shift: Total I/O In: 446.6 [I.V.:421.5; IV Piggyback:25.1] Out: 50 [Urine:50]  General appearance: intubated and sedated Resp: on vent Cardio: regular rate and rhythm and pressors are weaning GI: soft, wound clean. Ostomy pink and productive  Lab Results:  Recent Labs    06/28/23 1911 06/28/23 1942 06/29/23 0557 06/29/23 0906  WBC 9.6  --  7.1  --   HGB 10.0*   < > 8.0* 8.2*  HCT 30.9*   < > 23.8* 24.0*  PLT 88*  84*  --  64*  --    < > = values in this interval not displayed.   BMET Recent Labs    06/28/23 1911 06/28/23 1942 06/29/23 0557 06/29/23 0906  NA 150*   < > 146* 150*  K 4.9   < > 3.6 3.5  CL 125*  --  118*  --   CO2 20*  --  21*  --   GLUCOSE 166*  --  179*  --   BUN 62*  --  61*  --   CREATININE 1.44*  --  1.39*  --   CALCIUM 6.9*  --  6.6*  --    < > = values in this interval not displayed.   PT/INR Recent Labs    06/28/23 1911  LABPROT 17.4*  INR 1.4*   ABG Recent Labs    06/28/23 1942 06/29/23 0906  PHART 7.199* 7.385  HCO3 21.8 23.6    Studies/Results: DG Chest Port 1 View Result Date: 06/28/2023 CLINICAL DATA:  [id number].  Pulmonary edema follow-up. EXAM: PORTABLE CHEST 1 VIEW COMPARISON:  Portable chest yesterday at 9:24 a.m. FINDINGS: 7:28 p.m. ETT  interval insertion with tip 6.6 cm from the carina. Dobbhoff feeding tube is coiled in the stomach as before. Interval NGT insertion with the tip in the proximal gastric antrum. Right PICC again terminating at the superior cavoatrial junction. The heart is slightly enlarged. There is improvement in central vascular and interstitial prominence. There is interval clearing in the lower lung fields but there is still interstitial and heterogeneous patchy airspace disease in the upper lobes consistent with pneumonia, edema or combination. The aorta is tortuous and calcified with stable mediastinum. No new osseous findings. Multiple overlying monitor wires. Aortic endograft upper abdomen. IMPRESSION: 1. Interval ETT insertion with tip 6.6 cm from the carina. 2. Interval NGT insertion with tip in the proximal gastric antrum. 3. Interval clearing in the lower lung fields but there is still interstitial and heterogeneous patchy airspace disease in the upper lobes consistent with pneumonia, edema or combination. 4. Improved central vascular and interstitial prominence. 5. Aortic atherosclerosis. Electronically Signed   By: Denman Fischer M.D.   On: 06/28/2023 20:54   CT Angio Abd/Pel w/ and/or w/o Result Date: 06/28/2023 CLINICAL DATA:  Postop day 8 from SMA stent placement. Increased abdominal  pain with leukocytosis and fever. EXAM: CTA ABDOMEN AND PELVIS WITHOUT AND WITH CONTRAST TECHNIQUE: Multidetector CT imaging of the abdomen and pelvis was performed using the standard protocol during bolus administration of intravenous contrast. Multiplanar reconstructed images and MIPs were obtained and reviewed to evaluate the vascular anatomy. RADIATION DOSE REDUCTION: This exam was performed according to the departmental dose-optimization program which includes automated exposure control, adjustment of the mA and/or kV according to patient size and/or use of iterative reconstruction technique. CONTRAST:  75mL OMNIPAQUE IOHEXOL  350 MG/ML SOLN COMPARISON:  Abdominopelvic CT 06/19/2023 and 06/09/2023 FINDINGS: VASCULAR Aorta: Status post endograft stent repair of an abdominal aortic aneurysm with stent graft extending from the distal thoracic aorta into the iliac arteries. Pre-existing short renal artery stents bilaterally. New superior mesenteric artery stent, further described below. Interval significant enlargement of the excluded aneurysm sac, especially anteriorly, now measuring 6.4 x 6.1 cm on image 79/6 (previously 5.6 x 4.5 cm), extending from the diaphragmatic hiatus to the iliac bifurcation. Pre contrast images demonstrate increased density within the excluded aneurysm sac with anterior extension beyond the previously demonstrated intimal calcifications inferior to the renal arteries. Post-contrast, there are new contrast collections peripheral to the stent graft measuring up to 2.4 x 1.4 x 1.9 cm with anterior extension beyond the inferior and posterior to the right renal artery stent. There is additional smaller collection anteriorly, just inferior to the SMA origin. There are no contrast-enhancing collections beyond the aortic intima. The stent grafts are patent. No extraluminal gas collection identified within the excluded aneurysm sac or retroperitoneum. Celiac: Stable high-grade focal stenosis just past the origin of the celiac trunk. Distal patency without additional significant stenosis. SMA: Interval stenting of the proximal superior mesenteric artery. The stent is patent. No immediate surrounding endograft leak. No distal stenosis. Renals: Patent renal arteries bilaterally status post previous bilateral renal artery stenting. IMA: Chronically occluded. Inflow: Patent common iliac artery stents. External and internal iliac atherosclerosis without significant stenosis. Veins: No obvious venous abnormality within the limitations of this arterial phase study. Review of the MIP images confirms the above findings. NON-VASCULAR  FINDINGS Lower chest: New right greater than left pleural effusions with associated bibasilar atelectasis and possible edema. Hepatobiliary: The liver is normal in density without suspicious focal abnormality. Prior cholecystectomy. No significant biliary dilatation. Pancreas: Unremarkable. No pancreatic ductal dilatation or surrounding inflammatory changes. Spleen: Normal in size without focal abnormality. Adrenals/Urinary Tract: Both adrenal glands appear normal. The kidneys appear normal without evidence of urinary tract calculus, suspicious lesion or hydronephrosis. Renal vascular calcifications bilaterally. There is air within the urinary bladder, likely iatrogenic. No focal bladder abnormality identified. Stomach/Bowel: No enteric contrast administered. Feeding tube projects into the proximal jejunum. There are fluid-filled loops of small and large bowel without significant residual bowel distension or focal wall thickening. No pneumatosis. A rectal tube is in place. Lymphatic: There are no enlarged abdominal or pelvic lymph nodes. Reproductive: Stable moderate enlargement of the prostate gland. Other: Interval increased generalized subcutaneous edema. Small amount of ascites. No evidence of hemoperitoneum or pneumoperitoneum. The abdominal wall appears intact. Musculoskeletal: No acute or significant osseous findings. 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 No evidence of discitis or osteomyelitis. Review of the MIP images confirms the above findings. IMPRESSION: 1. Interval stenting of the proximal superior mesenteric artery with stent patency. The aortoiliac stent graft and bilateral renal artery stents remain patent. 2. 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 Thrombus extends anterior to the intima within  the distal abdominal aorta. No associated suspicious peripheral enhancement or gas within the excluded aneurysm sac to strongly suggest infection at this time (not fully excluded in this clinical context). 3. No evidence of bowel ischemia or perforation. Previously demonstrated small bowel distension has improved. 4. New right greater than left pleural effusions with associated bibasilar atelectasis and possible edema. 5. Increased generalized subcutaneous edema and small amount of ascites, likely due to fluid overload. 6. 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 7. Feeding tube extends into the proximal jejunum. 8. Critical Value/emergent results were called by telephone at the time of interpretation on 06/28/2023 at 2:30 pm to provider Maylene Spear, who reports that the vascular team is aware of these findings and has taken the patient to the operating room. Electronically Signed   By: Elmon Hagedorn M.D.   On: 06/28/2023 14:38   HYBRID OR IMAGING ([facility] ONLY) Result Date: 06/28/2023 There is no interpretation for this exam.  This order is for images obtained during a surgical procedure.  Please See "Surgeries" Tab for more information regarding the procedure.    Anti-infectives: Anti-infectives (From admission, onward)    Start     Dose/Rate Route Frequency Ordered Stop   06/30/23 0300  vancomycin (VANCOREADY) IVPB 500 mg/100 mL        500 mg 100 mL/hr over 60 Minutes Intravenous Every 24 hours 06/29/23 0054     06/29/23 0200  vancomycin (VANCOCIN) 1,250 mg in sodium chloride 0.9 % 250 mL IVPB        1,250 mg 175 mL/hr over 90 Minutes Intravenous  Once 06/29/23 0054 06/29/23 0336   06/28/23 0930  piperacillin-tazobactam (ZOSYN) IVPB 3.375 g  Status:  Discontinued        3.375 g 12.5 mL/hr over 240 Minutes Intravenous Every 12 hours 06/28/23 0832 06/28/23 0834   06/28/23 0930  piperacillin-tazobactam (ZOSYN) IVPB 3.375 g        3.375  g 12.5 mL/hr over 240 Minutes Intravenous Every 8 hours 06/28/23 0834     06/24/23 2200  piperacillin-tazobactam (ZOSYN) IVPB 3.375 g        3.375 g 12.5 mL/hr over 240 Minutes Intravenous Every 12 hours 06/24/23 1013 06/27/23 1621   06/20/23 1000  piperacillin-tazobactam (ZOSYN) IVPB 3.375 g  Status:  Discontinued        3.375 g 12.5 mL/hr over 240 Minutes Intravenous Every 8 hours 06/20/23 0903 06/24/23 1013       Assessment/Plan: s/p Procedure(s): AORTOGRAM (N/A) INSERTION OF GORE VIABAHN VBX 6mm X 19mm STENT AND 28.70mm X 4.5cm GORE EXLUDER STENT (Left) LAPAROSCOPY, DIAGNOSTIC (N/A) LAPAROTOMY, EXPLORATORY (N/A) COLECTOMY, WITH ILEOSTOMY CREATION (N/A) POD 1 Right colectomy and ileostomy CCM managing sepsis Pressors as needed Wean vent when able Ostomy care Bowel rest for now. Continue tpn. Consider trickle feeds soon as sepsis improves Continue abx  LOS: 9 days    Erik [name] 06/29/2023

## 2023-06-29 NOTE — Progress Notes (Signed)
 Rounded on pt to hang TPN. Per primary RN, plan to remove PICC due to concern for infection and infuse medications through new internal jugular CVC. Xray obtained at 1743 but no official read. Contacted PICC RN's [facility]enee [facility] Rice Chamorro who stated tip of internal jugular is not in correct location. Advised to leave PICC in for now and wait for official read and 2nd attempt at internal jugular CVC. Plan to administer TPN once new line is established.

## 2023-06-29 NOTE — IPAL (Addendum)
  Interdisciplinary Goals of Care Family Meeting   Date carried out: 06/29/2023  Location of the meeting: Bedside  Member's involved: Physician and Family Member or next of kin  Durable Power of Attorney or acting medical decision maker: Erik [name]  Discussion: We discussed goals of care for Erik [name]. .  We discussed patient's code status in regards to the recent changes in his clinical status. She reports who would not want to have CPR at this time, but would want to continue with aggressive medical care.  Code status:   Code Status: Do not attempt resuscitation (DNR) PRE-ARREST INTERVENTIONS DESIRED   Disposition: Continue current acute care  Time spent for the meeting: 15 minutes    Wilfredo Hanly, MD  06/29/2023, 7:07 PM

## 2023-06-29 NOTE — Progress Notes (Signed)
 Echocardiogram 2D Echocardiogram has been performed.  Erik [name] 06/29/2023, 4:07 PM

## 2023-06-29 NOTE — TOC Progression Note (Signed)
 Transition of Care (TOC) - Progression Note    Patient Details  Name: Erik [name]. MRN: [id number] Date of Birth: [birth year]  Transition of Care [facility]) CM/SW Contact  Jannice Mends, LCSW Phone Number: 06/29/2023, 9:15 AM  Clinical Narrative:    Patient transferred to 4N and intubated. CSW will continue to follow.    Expected Discharge Plan: Home w Home Health Services Barriers to Discharge: Continued Medical Work up  Expected Discharge Plan and Services       Living arrangements for the past 2 months: Single Family Home                                       Social Determinants of Health (SDOH) Interventions SDOH Screenings   Food Insecurity: No Food Insecurity (06/20/2023)  Housing: Low Risk  (06/20/2023)  Transportation Needs: No Transportation Needs (06/20/2023)  Utilities: Not At Risk (06/20/2023)  Alcohol Screen: Low Risk  (05/31/2019)  Depression (PHQ2-9): Low Risk  (09/27/2022)  Financial Resource Strain: Low Risk  (04/29/2021)  Physical Activity: Inactive (05/31/2019)  Stress: No Stress Concern Present (05/31/2019)  Tobacco Use: High Risk (06/28/2023)    Readmission Risk Interventions    02/17/2022    4:21 PM  Readmission Risk Prevention Plan  Transportation Screening Complete  PCP or Specialist Appt within 3-5 Days Complete  HRI or Home Care Consult Complete  Social Work Consult for Recovery Care Planning/Counseling Complete  Palliative Care Screening Complete  Medication Review Oceanographer) Complete

## 2023-06-29 NOTE — Progress Notes (Signed)
 Pharmacy Antibiotic Note  Erik [name]. is a 83 y.o. male admitted on 06/19/2023 with MRSA + E.coli bacteremia in the setting of recent endoleak and ischemic bowel surgery. Pharmacy has been consulted for Daptomycin and Zosyn dosing.  Noted triple lumen PICC in place since 4/12 - seems possible source for MRSA bacteremia, CCM planning removal/replacement today.  Will adjust Vancomycin to Daptomycin in the setting of advanced age/renal function.   Plan: - Start Daptomycin 500 mg IV every 24 hours - Weekly CK - Continue Zosyn 3.375g IV every 8 hours for now - Will continue to follow renal function, culture results, LOT, and antibiotic de-escalation plans   Height: 5\' 6"  (167.6 cm) Weight: 57 kg (125 lb 10.6 oz) IBW/kg (Calculated) : 63.8  Temp (24hrs), Avg:98.6 F (37 C), Min:97.9 F (36.6 C), Max:99.1 F (37.3 C)  Recent Labs  Lab 06/26/23 0428 06/26/23 1705 06/27/23 0515 06/28/23 0554 06/28/23 0916 06/28/23 1911 06/28/23 2214 06/29/23 0557  WBC 7.3  --  8.6 11.8*  --  9.6  --  7.1  CREATININE 1.21 1.14 1.26* 1.37*  --  1.44*  --  1.39*  LATICACIDVEN  --   --   --   --  1.4 1.0 1.3  --     Estimated Creatinine Clearance: 33 mL/min (A) (by C-G formula based on SCr of 1.39 mg/dL (H)).    Allergies  Allergen Reactions   Codeine Anaphylaxis and Swelling    throat swells   Heparin Other (See Comments)    R/O HIT; Patient's wife stated that heparin was not an allergy of the patient; She is unaware of a heparin allergy    Antimicrobials this admission: Zosyn 4/7 >> 4/13; restart 4/15 >> Vancomycin 4/16 x 1 Daptomycin 4/17 >>  Microbiology results: 4/12 MRSA PCR >> positive 4/15 BCx >> 4/4 GPC + GNR >> BCID MRSA + E.coli 4/16 BCx >> ng<12h  Thank you for allowing pharmacy to be a part of this patient's care.  Garland Junk, PharmD, BCPS, BCIDP Infectious Diseases Clinical Pharmacist 06/29/2023 2:47 PM   **Pharmacist phone directory can now be found on  [email or web address] ([email or web address]).  Listed under [facility].

## 2023-06-29 NOTE — Progress Notes (Signed)
 PHARMACY - TOTAL PARENTERAL NUTRITION CONSULT NOTE   Indication:  malnutrition, suspected ileus  Patient Measurements: Height: 5\' 6"  (167.6 cm) Weight: 57 kg (125 lb 10.6 oz) IBW/kg (Calculated) : 63.8 TPN AdjBW (KG): 50.8 Body mass index is 20.28 kg/m.  Assessment:  83yoM admitted 4/7 for complaints of acute on chronic abdominal pain (2 - 3 month history, worsened for 2 days prior to admission). Initial CT abdomen/pelvis showed concern for early SBO vs ileus as well as stenosis of celiac artery and SMA, mesenteric stent placed. Underwent diagnostic laparoscopy and mesenteric angiography on 4/7, no bowel ischemia appreciated. All of small bowel viable per surgery note.  Has had weight loss over past several years from 132 lbs to 112 lbs despite normal dietary intake, now has muscle/fat depletion with concern for severe malnutrition on RD exam. Has not received enteral nutrition since admission on 4/7, NG tube to suction. Per vascular surgery team, abdominal pain may be 2/2 edematous bowel so holding tube feeding. Pharmacy consulted for TPN 4/9.  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; 140 mL FMS), LBM 4/15; EBM 50 mL  GI Imaging: 4/6 CT: multiple fluid filled mildly dilated loops of small bowel 4/7 KUB: decr gaseous distension of small bowel loops, retained stool in R colon 4/7 KUB SBO protocol: contrast within colonic lumen 4/15 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 Diania Fortes 4/16, plan to line exchange this PM (after ~24 hrs antibiotics) and continue TPN TPN start date: 06/23/23  Nutritional Goals: Goal TPN rate is 70 mL/hr (provides 95g AA and 1800 kCal per day) - Pushing toward upper end of goals due to s/p surgery (RD request)  RD Estimated Needs Total Energy Estimated Needs: 1500-1800 kcal/d Total Protein Estimated Needs: 80-95g/d Total Fluid Estimated Needs: 1.8L/d  Current Nutrition:  TPN Vital 1.5 off due to surgery  Plan:  Increase TPN at goal 70 ml/hr to provide 100% of needs Electrolytes in TPN: removed Na 4/11, K 30 mEq/L, Ca 4 mEq/L, Mg 5 mEq/L, Phos 15 mmol/L, max acetate Add standard MVI and trace elements to TPN Continue sensitive SSI Q6H for now Thiamine x7 days per RD (4/10 - 4/16)  Per Dr. Diania Fortes - KCl 10 mEq IV x2 and reduce Bicarb drip to 50 mL/hr.  Monitor TPN labs on Mon/Thurs - labs in AM Line exchange this PM - plan to continue TPN.   Lenard Quam, PharmD, BCPS, BCCCP Please refer to [facility] for [facility] numbers 06/29/2023, 7:08 AM

## 2023-06-29 NOTE — Procedures (Signed)
 Central Venous Catheter Insertion Procedure Note  Erik [name]  [id number]  02/17/1941  Date:06/29/23  Time:6:22 PM   Provider Performing:Chyane Greer B Diania Fortes   Procedure: Insertion of Non-tunneled Central Venous [phone]) with US  guidance ([id number])   Indication(s) Medication administration  Consent Risks of the procedure as well as the alternatives and risks of each were explained to the patient and/or caregiver.  Consent for the procedure was obtained and is signed in the bedside chart  Anesthesia Topical only with 1% lidocaine   Timeout Verified patient identification, verified procedure, site/side was marked, verified correct patient position, special equipment/implants available, medications/allergies/relevant history reviewed, required imaging and test results available.  Sterile Technique Maximal sterile technique including full sterile barrier drape, hand hygiene, sterile gown, sterile gloves, mask, hair covering, sterile ultrasound probe cover (if used).  Procedure Description Area of catheter insertion was cleaned with chlorhexidine and draped in sterile fashion.  With real-time ultrasound guidance a central venous catheter was placed into the left internal jugular vein. Nonpulsatile blood flow and easy flushing noted in all ports.  The catheter was sutured in place and sterile dressing applied.  Complications/Tolerance Catheter ended in the right subclavian vein, will be removed Chest X-ray is ordered to verify placement for internal jugular or subclavian cannulation.   Chest x-ray is not ordered for femoral cannulation.  EBL Minimal  Specimen(s) None

## 2023-06-30 DIAGNOSIS — Z9889 Other specified postprocedural states: Secondary | ICD-10-CM | POA: Diagnosis not present

## 2023-06-30 DIAGNOSIS — Z9049 Acquired absence of other specified parts of digestive tract: Secondary | ICD-10-CM

## 2023-06-30 DIAGNOSIS — K56609 Unspecified intestinal obstruction, unspecified as to partial versus complete obstruction: Secondary | ICD-10-CM | POA: Diagnosis not present

## 2023-06-30 DIAGNOSIS — E8721 Acute metabolic acidosis: Secondary | ICD-10-CM | POA: Diagnosis not present

## 2023-06-30 DIAGNOSIS — R579 Shock, unspecified: Secondary | ICD-10-CM | POA: Diagnosis not present

## 2023-06-30 LAB — POCT I-STAT 7, (LYTES, BLD GAS, ICA,H+H)
Acid-base deficit: 2 mmol/L (ref 0.0–2.0)
Bicarbonate: 22.6 mmol/L (ref 20.0–28.0)
Calcium, Ion: 1.08 mmol/L — ABNORMAL LOW (ref 1.15–1.40)
HCT: 24 % — ABNORMAL LOW (ref 39.0–52.0)
Hemoglobin: 8.2 g/dL — ABNORMAL LOW (ref 13.0–17.0)
O2 Saturation: 99 %
Patient temperature: 37.3
Potassium: 3.5 mmol/L (ref 3.5–5.1)
Sodium: 150 mmol/L — ABNORMAL HIGH (ref 135–145)
TCO2: 24 mmol/L (ref 22–32)
pCO2 arterial: 36.7 mmHg (ref 32–48)
pH, Arterial: 7.398 (ref 7.35–7.45)
pO2, Arterial: 123 mmHg — ABNORMAL HIGH (ref 83–108)

## 2023-06-30 LAB — MAGNESIUM
Magnesium: 2.4 mg/dL (ref 1.7–2.4)
Magnesium: 2.5 mg/dL — ABNORMAL HIGH (ref 1.7–2.4)

## 2023-06-30 LAB — GLUCOSE, CAPILLARY
Glucose-Capillary: 108 mg/dL — ABNORMAL HIGH (ref 70–99)
Glucose-Capillary: 122 mg/dL — ABNORMAL HIGH (ref 70–99)
Glucose-Capillary: 137 mg/dL — ABNORMAL HIGH (ref 70–99)
Glucose-Capillary: 145 mg/dL — ABNORMAL HIGH (ref 70–99)
Glucose-Capillary: 150 mg/dL — ABNORMAL HIGH (ref 70–99)
Glucose-Capillary: 93 mg/dL (ref 70–99)

## 2023-06-30 LAB — COMPREHENSIVE METABOLIC PANEL WITH GFR
ALT: 27 U/L (ref 0–44)
AST: 24 U/L (ref 15–41)
Albumin: 2 g/dL — ABNORMAL LOW (ref 3.5–5.0)
Alkaline Phosphatase: 50 U/L (ref 38–126)
Anion gap: 13 (ref 5–15)
BUN: 65 mg/dL — ABNORMAL HIGH (ref 8–23)
CO2: 22 mmol/L (ref 22–32)
Calcium: 7 mg/dL — ABNORMAL LOW (ref 8.9–10.3)
Chloride: 114 mmol/L — ABNORMAL HIGH (ref 98–111)
Creatinine, Ser: 1.58 mg/dL — ABNORMAL HIGH (ref 0.61–1.24)
GFR, Estimated: 43 mL/min — ABNORMAL LOW (ref >60)
Glucose, Bld: 142 mg/dL — ABNORMAL HIGH (ref 70–99)
Potassium: 3.1 mmol/L — ABNORMAL LOW (ref 3.5–5.1)
Sodium: 149 mmol/L — ABNORMAL HIGH (ref 135–145)
Total Bilirubin: 1 mg/dL (ref 0.0–1.2)
Total Protein: 5.1 g/dL — ABNORMAL LOW (ref 6.5–8.1)

## 2023-06-30 LAB — CBC
HCT: 24.1 % — ABNORMAL LOW (ref 39.0–52.0)
Hemoglobin: 8.1 g/dL — ABNORMAL LOW (ref 13.0–17.0)
MCH: 28.6 pg (ref 26.0–34.0)
MCHC: 33.6 g/dL (ref 30.0–36.0)
MCV: 85.2 fL (ref 80.0–100.0)
Platelets: 87 10*3/uL — ABNORMAL LOW (ref 150–400)
RBC: 2.83 MIL/uL — ABNORMAL LOW (ref 4.22–5.81)
RDW: 17.7 % — ABNORMAL HIGH (ref 11.5–15.5)
WBC: 12.3 10*3/uL — ABNORMAL HIGH (ref 4.0–10.5)
nRBC: 0 % (ref 0.0–0.2)

## 2023-06-30 LAB — CK: Total CK: 176 U/L (ref 49–397)

## 2023-06-30 LAB — PHOSPHORUS: Phosphorus: 4 mg/dL (ref 2.5–4.6)

## 2023-06-30 LAB — SURGICAL PATHOLOGY

## 2023-06-30 MED ORDER — TRAVASOL 10 % IV SOLN: INTRAVENOUS | Status: DC

## 2023-06-30 MED ORDER — VITAL 1.5 CAL PO LIQD
1000.0000 mL | ORAL | Status: DC
  Administered 2023-06-30: 1000 mL

## 2023-06-30 MED ORDER — NOREPINEPHRINE 4 MG/250ML-% IV SOLN: 2.0000 ug/min | INTRAVENOUS | Status: DC

## 2023-06-30 MED ORDER — POTASSIUM CHLORIDE 20 MEQ PO PACK
40.0000 meq | PACK | ORAL | Status: AC
  Administered 2023-06-30 (×2): 40 meq
  Filled 2023-06-30 (×2): qty 2

## 2023-06-30 MED ORDER — PANTOPRAZOLE SODIUM 40 MG IV SOLR
40.0000 mg | INTRAVENOUS | Status: DC
  Administered 2023-06-30 – 2023-07-03 (×4): 40 mg via INTRAVENOUS
  Filled 2023-06-30 (×4): qty 10

## 2023-06-30 MED ORDER — ASPIRIN 81 MG PO CHEW
81.0000 mg | CHEWABLE_TABLET | Freq: Every day | ORAL | Status: DC
  Administered 2023-06-30 – 2023-07-03 (×4): 81 mg
  Filled 2023-06-30 (×4): qty 1

## 2023-06-30 MED ORDER — SODIUM CHLORIDE 0.9 % IV SOLN: 250.0000 mL | INTRAVENOUS | Status: AC

## 2023-06-30 MED ORDER — VITAL HIGH PROTEIN PO LIQD: 1000.0000 mL | ORAL | Status: DC

## 2023-06-30 NOTE — Plan of Care (Signed)
  Problem: Clinical Measurements: Goal: Ability to maintain clinical measurements within normal limits will improve Outcome: Progressing Goal: Cardiovascular complication will be avoided Outcome: Progressing   Problem: Nutrition: Goal: Adequate nutrition will be maintained Outcome: Progressing   Problem: Elimination: Goal: Will not experience complications related to urinary retention Outcome: Progressing   Problem: Nutritional: Goal: Maintenance of adequate nutrition will improve Outcome: Progressing   Problem: Tissue Perfusion: Goal: Adequacy of tissue perfusion will improve Outcome: Progressing   Problem: Cardiovascular: Goal: Vascular access site(s) Level 0-1 will be maintained Outcome: Completed/Met

## 2023-06-30 NOTE — Progress Notes (Signed)
 2 Days Post-Op  Subjective: CC: Awake on vent. Having diffuse abdominal pain that is stable from yesterday. Off pressors. Having ostomy output. Scant NGT output. Good uop.   Tmax 100. HR 70's. Art line SBP okay. WBC 12.3.   Objective: Vital signs in last 24 hours: Temp:  [98.2 F (36.8 C)-100 F (37.8 C)] 99.1 F (37.3 C) (04/17 0930) Pulse Rate:  [55-77] 72 (04/17 0930) Resp:  [21-29] 26 (04/17 0930) BP: (103-147)/(50-72) 125/66 (04/17 0900) SpO2:  [94 %-100 %] 98 % (04/17 0930) Arterial Line BP: (105-170)/(30-44) 164/40 (04/17 0930) FiO2 (%):  [40 %] 40 % (04/17 0834) Weight:  [58 kg] 58 kg (04/17 0500) Last BM Date : 06/30/23  Intake/Output from previous day: 04/16 0701 - 04/17 0700 In: 3610.6 [I.V.:3094; NG/GT:150; IV Piggyback:366.6] Out: 2110 [Urine:1380; Stool:730] Intake/Output this shift: Total I/O In: 105.1 [I.V.:92.7; IV Piggyback:12.4] Out: -   Vent Mode: PRVC FiO2 (%):  [40 %] 40 % Set Rate:  [26 bmp] 26 bmp Vt Set:  [510 mL] 510 mL PEEP:  [5 cmH20] 5 cmH20 Plateau Pressure:  [20 cmH20-22 cmH20] 21 cmH20  PE: Gen:  Awake on vent Abd: Soft, at least mild distension, generalized ttp, +BS. Stoma viable. Ileostomy bag w/ liquid stool in pouch.  GU: Foley w/ clear yellow urine.  Ext:  No LE edema or calf tenderness Lab Results:  Recent Labs    06/29/23 0557 06/29/23 0906 06/30/23 0412  WBC 7.1  --  12.3*  HGB 8.0* 8.2* 8.1*  HCT 23.8* 24.0* 24.1*  PLT 64*  --  87*   BMET Recent Labs    06/29/23 0557 06/29/23 0906 06/30/23 0412  NA 146* 150* 149*  K 3.6 3.5 3.1*  CL 118*  --  114*  CO2 21*  --  22  GLUCOSE 179*  --  142*  BUN 61*  --  65*  CREATININE 1.39*  --  1.58*  CALCIUM 6.6*  --  7.0*   PT/INR Recent Labs    06/28/23 1911  LABPROT 17.4*  INR 1.4*   CMP     Component Value Date/Time   NA 149 (H) 06/30/2023 0412   NA 138 08/18/2018 1135   K 3.1 (L) 06/30/2023 0412   CL 114 (H) 06/30/2023 0412   CO2 22 06/30/2023 0412    GLUCOSE 142 (H) 06/30/2023 0412   BUN 65 (H) 06/30/2023 0412   BUN 19 08/18/2018 1135   CREATININE 1.58 (H) 06/30/2023 0412   CREATININE 1.11 10/09/2021 1646   CALCIUM 7.0 (L) 06/30/2023 0412   PROT 5.1 (L) 06/30/2023 0412   ALBUMIN 2.0 (L) 06/30/2023 0412   AST 24 06/30/2023 0412   ALT 27 06/30/2023 0412   ALKPHOS 50 06/30/2023 0412   BILITOT 1.0 06/30/2023 0412   GFRNONAA 43 (L) 06/30/2023 0412   GFRAA 95 08/18/2018 1135   Lipase     Component Value Date/Time   LIPASE 61 (H) 03/09/2022 0812    Studies/Results: DG CHEST PORT 1 VIEW Result Date: 06/29/2023 CLINICAL DATA:  Respiratory failure, central venous catheter placement EXAM: PORTABLE CHEST 1 VIEW COMPARISON:  None Available. FINDINGS: Right subclavian central venous catheter has been placed with its tip extending superiorly into the expected jugular vein beyond the superior margin of the examination. Endotracheal tube, nasoenteric feeding tube, and right upper extremity PICC line are unchanged. Pulmonary insufflation is stable. Superimposed asymmetric perihilar and upper lung zone airspace infiltrates appear slightly progressive since prior examination, in keeping with infection or  noncardiogenic pulmonary edema. No pneumothorax or pleural effusion. Cardiac size is within normal limits. Abdominal aortic stent graft is partially visualized. IMPRESSION: 1. Interval placement of right subclavian central venous catheter with its tip extending superiorly into the expected jugular vein beyond the superior margin of the examination. Repositioning is recommended. 2. Stable support lines and tubes. 3. Progressive asymmetric perihilar and upper lung zone airspace infiltrates, in keeping with infection or noncardiogenic pulmonary edema. These results will be called to the ordering clinician or representative by the Radiologist Assistant, and communication documented in the PACS or [employer]. Electronically Signed   By: Worthy Heads  M.D.   On: 06/29/2023 20:22   ECHOCARDIOGRAM COMPLETE Result Date: 06/29/2023    ECHOCARDIOGRAM REPORT   Patient Name:   Erik [name]. Date of Exam: 06/29/2023 Medical Rec #:  [id number]             Height:       66.0 in Accession #:    [id number]            Weight:       125.7 lb Date of Birth:  1941/01/03              BSA:          1.641 m Patient Age:    83 years              BP:           145/55 mmHg Patient Gender: M                     HR:           60 bpm. Exam Location:  Inpatient Procedure: 2D Echo, Cardiac Doppler and Color Doppler (Both Spectral and Color            Flow Doppler were utilized during procedure). Indications:    Endocarditis I38  History:        Patient has prior history of Echocardiogram examinations, most                 recent 06/24/2023. Cardiomyopathy, Previous Myocardial Infarction                 and CAD, COPD, Endocarditis, Signs/Symptoms:Murmur; Risk                 Factors:Hypertension and Dyslipidemia.  Sonographer:    Terrilee Few RCS Referring Phys: ADITYA PALIWAL IMPRESSIONS  1. Left ventricular ejection fraction, by estimation, is 50 to 55%. The left ventricle has low normal function. The left ventricle has no regional wall motion abnormalities. Left ventricular diastolic parameters are consistent with Grade II diastolic dysfunction (pseudonormalization).  2. Right ventricular systolic function is normal. The right ventricular size is normal.  3. Left atrial size was moderately dilated.  4. RA mass seen in images likely represents prominent eustachian valve. Right atrial size was mildly dilated.  5. Restricted posterior leaflet with at least moderate MR. Appears worsened since last echo. The mitral valve is degenerative. Moderate mitral valve regurgitation. Mild mitral stenosis. The mean mitral valve gradient is 6.0 mmHg. Severe mitral annular calcification.  6. Tricuspid valve regurgitation is mild to moderate.  7. The aortic valve is calcified. Aortic valve  regurgitation is severe. No aortic stenosis is present.  8. The inferior vena cava is normal in size with greater than 50% respiratory variability, suggesting right atrial pressure of 3 mmHg. FINDINGS  Left Ventricle: Left ventricular ejection fraction,  by estimation, is 50 to 55%. The left ventricle has low normal function. The left ventricle has no regional wall motion abnormalities. The left ventricular internal cavity size was normal in size. There is no left ventricular hypertrophy. Left ventricular diastolic parameters are consistent with Grade II diastolic dysfunction (pseudonormalization). Right Ventricle: The right ventricular size is normal. No increase in right ventricular wall thickness. Right ventricular systolic function is normal. Left Atrium: Left atrial size was moderately dilated. Right Atrium: RA mass seen in images likely represents prominent eustachian valve. Right atrial size was mildly dilated. Prominent Eustachian valve. Pericardium: There is no evidence of pericardial effusion. Mitral Valve: Restricted posterior leaflet with at least moderate MR. Appears worsened since last echo. The mitral valve is degenerative in appearance. Severe mitral annular calcification. Moderate mitral valve regurgitation. Mild mitral valve stenosis. MV peak gradient, 13.8 mmHg. The mean mitral valve gradient is 6.0 mmHg. There is no evidence of mitral valve vegetation. Tricuspid Valve: The tricuspid valve is normal in structure. Tricuspid valve regurgitation is mild to moderate. No evidence of tricuspid stenosis. There is no evidence of tricuspid valve vegetation. Aortic Valve: The aortic valve is calcified. Aortic valve regurgitation is severe. No aortic stenosis is present. Aortic valve mean gradient measures 9.5 mmHg. Aortic valve peak gradient measures 18.5 mmHg. Aortic valve area, by VTI measures 1.30 cm. Pulmonic Valve: The pulmonic valve was normal in structure. Pulmonic valve regurgitation is not  visualized. No evidence of pulmonic stenosis. Aorta: The aortic root is normal in size and structure. Venous: The inferior vena cava is normal in size with greater than 50% respiratory variability, suggesting right atrial pressure of 3 mmHg. IAS/Shunts: No atrial level shunt detected by color flow Doppler.  LEFT VENTRICLE PLAX 2D LVIDd:         4.70 cm   Diastology LVIDs:         3.40 cm   LV e' medial:    5.87 cm/s LV PW:         1.00 cm   LV E/e' medial:  27.8 LV IVS:        1.20 cm   LV e' lateral:   9.36 cm/s LVOT diam:     1.80 cm   LV E/e' lateral: 17.4 LV SV:         66 LV SV Index:   40 LVOT Area:     2.54 cm  RIGHT VENTRICLE             IVC RV S prime:     15.10 cm/s  IVC diam: 2.80 cm TAPSE (M-mode): 2.6 cm LEFT ATRIUM             Index        RIGHT ATRIUM           Index LA diam:        4.60 cm 2.80 cm/m   RA Area:     15.10 cm LA Vol (A2C):   93.7 ml 57.09 ml/m  RA Volume:   36.30 ml  22.12 ml/m LA Vol (A4C):   84.7 ml 51.60 ml/m LA Biplane Vol: 89.4 ml 54.47 ml/m  AORTIC VALVE AV Area (Vmax):    1.37 cm AV Area (Vmean):   1.40 cm AV Area (VTI):     1.30 cm AV Vmax:           215.00 cm/s AV Vmean:          139.500 cm/s AV VTI:  0.506 m AV Peak Grad:      18.5 mmHg AV Mean Grad:      9.5 mmHg LVOT Vmax:         115.67 cm/s LVOT Vmean:        76.733 cm/s LVOT VTI:          0.259 m LVOT/AV VTI ratio: 0.51  AORTA Ao Root diam: 3.00 cm Ao Asc diam:  3.20 cm MITRAL VALVE MV Area (PHT): 2.30 cm     SHUNTS MV Area VTI:   1.00 cm     Systemic VTI:  0.26 m MV Peak grad:  13.8 mmHg    Systemic Diam: 1.80 cm MV Mean grad:  6.0 mmHg MV Vmax:       1.86 m/s MV Vmean:      116.5 cm/s MV Decel Time: 330 msec MV E velocity: 163.00 cm/s MV A velocity: 161.00 cm/s MV E/A ratio:  1.01 Arta Lark Electronically signed by Arta Lark Signature Date/Time: 06/29/2023/4:22:17 PM    Final    DG Chest Port 1 View Result Date: 06/28/2023 CLINICAL DATA:  [id number].  Pulmonary edema follow-up. EXAM:  PORTABLE CHEST 1 VIEW COMPARISON:  Portable chest yesterday at 9:24 a.m. FINDINGS: 7:28 p.m. ETT interval insertion with tip 6.6 cm from the carina. Dobbhoff feeding tube is coiled in the stomach as before. Interval NGT insertion with the tip in the proximal gastric antrum. Right PICC again terminating at the superior cavoatrial junction. The heart is slightly enlarged. There is improvement in central vascular and interstitial prominence. There is interval clearing in the lower lung fields but there is still interstitial and heterogeneous patchy airspace disease in the upper lobes consistent with pneumonia, edema or combination. The aorta is tortuous and calcified with stable mediastinum. No new osseous findings. Multiple overlying monitor wires. Aortic endograft upper abdomen. IMPRESSION: 1. Interval ETT insertion with tip 6.6 cm from the carina. 2. Interval NGT insertion with tip in the proximal gastric antrum. 3. Interval clearing in the lower lung fields but there is still interstitial and heterogeneous patchy airspace disease in the upper lobes consistent with pneumonia, edema or combination. 4. Improved central vascular and interstitial prominence. 5. Aortic atherosclerosis. Electronically Signed   By: Denman Fischer M.D.   On: 06/28/2023 20:54   CT Angio Abd/Pel w/ and/or w/o Result Date: 06/28/2023 CLINICAL DATA:  Postop day 8 from SMA stent placement. Increased abdominal pain with leukocytosis and fever. EXAM: CTA ABDOMEN AND PELVIS WITHOUT AND WITH CONTRAST TECHNIQUE: Multidetector CT imaging of the abdomen and pelvis was performed using the standard protocol during bolus administration of intravenous contrast. Multiplanar reconstructed images and MIPs were obtained and reviewed to evaluate the vascular anatomy. RADIATION DOSE REDUCTION: This exam was performed according to the departmental dose-optimization program which includes automated exposure control, adjustment of the mA and/or kV according to  patient size and/or use of iterative reconstruction technique. 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 Pre-existing short renal artery stents bilaterally. New superior mesenteric artery stent, further described below. Interval significant enlargement of the excluded aneurysm sac, especially anteriorly, now measuring 6.4 x 6.1 cm on image 79/6 (previously 5.6 x 4.5 cm), extending from the diaphragmatic hiatus to the iliac bifurcation. Pre contrast images demonstrate increased density within the excluded aneurysm sac with anterior extension  beyond the previously demonstrated intimal calcifications inferior to the renal arteries. Post-contrast, there are new contrast collections peripheral to the stent graft measuring up to 2.4 x 1.4 x 1.9 cm with anterior extension beyond the inferior and posterior to the right renal artery stent. There is additional smaller collection anteriorly, just inferior to the SMA origin. There are no contrast-enhancing collections beyond the aortic intima. The stent grafts are patent. No extraluminal gas collection identified within the excluded aneurysm sac or retroperitoneum. Celiac: Stable high-grade focal stenosis just past the origin of the celiac trunk. Distal patency without additional significant stenosis. SMA: Interval stenting of the proximal superior mesenteric artery. The stent is patent. No immediate surrounding endograft leak. No distal stenosis. Renals: Patent renal arteries bilaterally status post previous bilateral renal artery stenting. IMA: Chronically occluded. Inflow: Patent common iliac artery stents. External and internal iliac atherosclerosis without significant stenosis. Veins: No obvious venous abnormality within the limitations  of this arterial phase study. Review of the MIP images confirms the above findings. NON-VASCULAR FINDINGS Lower chest: New right greater than left pleural effusions with associated bibasilar atelectasis and possible edema. Hepatobiliary: The liver is normal in density without suspicious focal abnormality. Prior cholecystectomy. No significant biliary dilatation. Pancreas: Unremarkable. No pancreatic ductal dilatation or surrounding inflammatory changes. Spleen: Normal in size without focal abnormality. Adrenals/Urinary Tract: Both adrenal glands appear normal. The kidneys appear normal without evidence of urinary tract calculus, suspicious lesion or hydronephrosis. Renal vascular calcifications bilaterally. There is air within the urinary bladder, likely iatrogenic. No focal bladder abnormality identified. Stomach/Bowel: No enteric contrast administered. Feeding tube projects into the proximal jejunum. There are fluid-filled loops of small and large bowel without significant residual bowel distension or focal wall thickening. No pneumatosis. A rectal tube is in place. Lymphatic: There are no enlarged abdominal or pelvic lymph nodes. Reproductive: Stable moderate enlargement of the prostate gland. Other: Interval increased generalized subcutaneous edema. Small amount of ascites. No evidence of hemoperitoneum or pneumoperitoneum. The abdominal wall appears intact. Musculoskeletal: No acute or significant osseous findings. 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 No evidence of discitis or osteomyelitis. Review of the MIP images confirms the above findings. IMPRESSION: 1. Interval stenting of the proximal superior mesenteric artery with stent patency. The aortoiliac stent graft and bilateral renal artery stents remain patent. 2. 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 Thrombus extends anterior to the intima within the distal abdominal aorta. No associated suspicious peripheral enhancement or gas within the excluded aneurysm sac to strongly suggest infection at this time (not fully excluded in this clinical context). 3. No evidence of bowel ischemia or perforation. Previously demonstrated small bowel distension has improved. 4. New right greater than left pleural effusions with associated bibasilar atelectasis and possible edema. 5. Increased generalized subcutaneous edema and small amount of ascites, likely due to fluid overload. 6. 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 7. Feeding tube extends into the proximal jejunum. 8. Critical Value/emergent results were called by telephone at the time of interpretation on 06/28/2023 at 2:30 pm to provider Osvaldo Shipper, who reports that the vascular team is aware of these findings and has taken the patient to the operating room. Electronically Signed   By: Carey Bullocks M.D.   On: 06/28/2023 14:38   HYBRID OR IMAGING (  [facility] ONLY) Result Date: 06/28/2023 There is no interpretation for this exam.  This order is for images obtained during a surgical procedure.  Please See "Surgeries" Tab for more information regarding the procedure.    Anti-infectives: Anti-infectives (From admission, onward)    Start     Dose/Rate Route Frequency Ordered Stop   06/30/23 0800  DAPTOmycin (CUBICIN) IVPB 500 mg/63mL premix        8 mg/kg  57 kg 100 mL/hr over 30 Minutes Intravenous Daily 06/29/23 1146     06/30/23 0300  vancomycin (VANCOREADY) IVPB 500 mg/100 mL  Status:  Discontinued        500 mg 100 mL/hr over 60 Minutes Intravenous Every 24 hours 06/29/23 0054 06/29/23 1146   06/29/23 0200  vancomycin (VANCOCIN) 1,250 mg in sodium chloride 0.9 % 250 mL IVPB        1,250 mg 175 mL/hr over 90 Minutes Intravenous  Once 06/29/23  0054 06/29/23 0336   06/28/23 0930  piperacillin-tazobactam (ZOSYN) IVPB 3.375 g  Status:  Discontinued        3.375 g 12.5 mL/hr over 240 Minutes Intravenous Every 12 hours 06/28/23 0832 06/28/23 0834   06/28/23 0930  piperacillin-tazobactam (ZOSYN) IVPB 3.375 g        3.375 g 12.5 mL/hr over 240 Minutes Intravenous Every 8 hours 06/28/23 0834     06/24/23 2200  piperacillin-tazobactam (ZOSYN) IVPB 3.375 g        3.375 g 12.5 mL/hr over 240 Minutes Intravenous Every 12 hours 06/24/23 1013 06/27/23 1621   06/20/23 1000  piperacillin-tazobactam (ZOSYN) IVPB 3.375 g  Status:  Discontinued        3.375 g 12.5 mL/hr over 240 Minutes Intravenous Every 8 hours 06/20/23 0903 06/24/23 1013        Assessment/Plan POD 10 s/p dx laparoscopy by Dr. 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  POD 2 S/p ex lap, R colectomy, end ileostomy by Dr. 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  - Okay for trickle TF's - BID WTD - WOCN consult - Appreciate CCM assistance w/ her care.   FEN - Trickle TF's, IVF per primary  VTE - SCDs, okay for ppx or therapeutic anticoagulation from our standpoint. On ASA. To note, he is thrombocytopenic w/ plt 87K ID - Dapto/Zosyn Foley - In place, good uop, per primary    LOS: 10 days    Delton Filbert, [facility] 06/30/2023, 11:06 AM Please see Amion for pager number during day hours 7:00am-4:30pm

## 2023-06-30 NOTE — Progress Notes (Signed)
 eLink Physician-Brief Progress Note Patient Name: Erik [name]. DOB: 1940-12-24 MRN: [id number]   Date of Service  06/30/2023  HPI/Events of Note  Has been able to titrate off of norepinephrine, remains on vasopressin 0.04 units.  eICU Interventions  Discontinue vasopressin without titration.  May need small dose of norepinephrine in the interim.  Maintain norepinephrine as needed     Intervention Category Intermediate Interventions: Hypotension - evaluation and management  Erlean Mealor 06/30/2023, 2:38 AM

## 2023-06-30 NOTE — Progress Notes (Signed)
 PHARMACY - TOTAL PARENTERAL NUTRITION CONSULT NOTE   Indication:  malnutrition, suspected ileus  Patient Measurements: Height: 5\' 6"  (167.6 cm) Weight: 58 kg (127 lb 13.9 oz) IBW/kg (Calculated) : 63.8 TPN AdjBW (KG): 50.8 Body mass index is 20.64 kg/m.  Assessment:  83yoM admitted 4/7 for complaints of acute on chronic abdominal pain (2 - 3 month history, worsened for 2 days prior to admission). Initial CT abdomen/pelvis showed concern for early SBO vs ileus as well as stenosis of celiac artery and SMA, mesenteric stent placed. Underwent diagnostic laparoscopy and mesenteric angiography on 4/7, no bowel ischemia appreciated. All of small bowel viable per surgery note.  Has had weight loss over past several years from 132 lbs to 112 lbs despite normal dietary intake, now has muscle/fat depletion with concern for severe malnutrition on RD exam. Has not received enteral nutrition since admission on 4/7, NG tube to suction. Per vascular surgery team, abdominal pain may be 2/2 edematous bowel so holding tube feeding. Pharmacy consulted for TPN 4/9.  Glucose / Insulin: last A1c 5.6% - CBGs acceptable  Used 12 units sSSI in the past 24 hrs Electrolytes: Na/CLup 149/114 (off FWF, no Na in TPN), CO2 improved 22, K 3.1 (off Bicarb), iCa 0.97, Phos/Mg wnl Last furosemide 4/10 Renal: AKI - Scr up 1.58 (baseline <1), BUN up 65 (22 on admission) Hepatic: LFTs trend down, Albumin < 1.5, Tbili 1.1, TG WNL Intake / Output; MIVF: UOP 1.3 mL/kg/hr, stool (160 mL ileostomy; 140 mL FMS), LBM 4/15; EBM 50 mL  GI Imaging: 4/6 CT: multiple fluid filled mildly dilated loops of small bowel 4/7 KUB: decr gaseous distension of small bowel loops, retained stool in R colon 4/7 KUB SBO protocol: contrast within colonic lumen 4/15 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, thrombus extends to intima of distal abdominal aorta GI Surgeries / Procedures:  4/7 Diagnostic laparoscopy and  mesenteric angiography 4/15 Aortogram with L-renal artery stent placement, aortic stent placement  Central access: PICC placed 4/12  *Discussed bacteremia and PICC line with Dr. Diania Fortes 4/16, plan to line exchange this PM (after ~24 hrs antibiotics) and continue TPN TPN start date: 06/23/23  Nutritional Goals: Goal TPN rate is 70 mL/hr (provides 95g AA and 1800 kCal per day) - Pushing toward upper end of goals due to s/p surgery (RD request)  RD Estimated Needs Total Energy Estimated Needs: 1500-1800 kcal/d Total Protein Estimated Needs: 80-95g/d Total Fluid Estimated Needs: 1.8L/d  Current Nutrition:  TPN - holding 4/17 (line holiday Vital 1.5 - restart at 20 ml/hr 4/17  Plan:  Stop TPN after current bag for line holiday with bacteremia.  Trickle feeds initiated per tube at 20 ml/hr. No need to wean TPN.  Follow-up tomorrow if need further TPN.  Continue sensitive SSI Q6H for now Thiamine x7 days per RD (4/10 - 4/16)  Per Dr. Diania Fortes - KCl 40 mEq per tube every 4 hours x2 Monitor TPN labs on Mon/Thurs - labs in AM  Lenard Quam, PharmD, BCPS, BCCCP Please refer to [facility] for [facility] numbers 06/30/2023, 8:29 AM

## 2023-06-30 NOTE — Progress Notes (Signed)
 [facility] ADULT ICU REPLACEMENT PROTOCOL   The patient does apply for the [facility] Adult ICU Electrolyte Replacment Protocol based on the criteria listed below:   1.Exclusion criteria: TCTS, ECMO, Dialysis, and Myasthenia Gravis patients 2. Is GFR >/= 30 ml/min? Yes.    Patient's GFR today is 43 3. Is SCr </= 2? Yes.   Patient's SCr is 1.58 mg/dL 4. Did SCr increase >/= 0.5 in 24 hours? No. 5.Pt's weight >40kg  Yes.   6. Abnormal electrolyte(s): K+ = 3.1  7. Electrolytes replaced per protocol 8.  Call MD STAT for K+ </= 2.5, Phos </= 1, or Mag </= 1 Physician:  Rito Chess, eMD  Jeffry Minister 06/30/2023 5:18 AM

## 2023-06-30 NOTE — Progress Notes (Signed)
 Subjective: Patient intubated   Antibiotics:  Anti-infectives (From admission, onward)    Start     Dose/Rate Route Frequency Ordered Stop   06/30/23 0800  DAPTOmycin (CUBICIN) IVPB 500 mg/56mL premix        8 mg/kg  57 kg 100 mL/hr over 30 Minutes Intravenous Daily 06/29/23 1146     06/30/23 0300  vancomycin (VANCOREADY) IVPB 500 mg/100 mL  Status:  Discontinued        500 mg 100 mL/hr over 60 Minutes Intravenous Every 24 hours 06/29/23 0054 06/29/23 1146   06/29/23 0200  vancomycin (VANCOCIN) 1,250 mg in sodium chloride 0.9 % 250 mL IVPB        1,250 mg 175 mL/hr over 90 Minutes Intravenous  Once 06/29/23 0054 06/29/23 0336   06/28/23 0930  piperacillin-tazobactam (ZOSYN) IVPB 3.375 g  Status:  Discontinued        3.375 g 12.5 mL/hr over 240 Minutes Intravenous Every 12 hours 06/28/23 0832 06/28/23 0834   06/28/23 0930  piperacillin-tazobactam (ZOSYN) IVPB 3.375 g        3.375 g 12.5 mL/hr over 240 Minutes Intravenous Every 8 hours 06/28/23 0834     06/24/23 2200  piperacillin-tazobactam (ZOSYN) IVPB 3.375 g        3.375 g 12.5 mL/hr over 240 Minutes Intravenous Every 12 hours 06/24/23 1013 06/27/23 1621   06/20/23 1000  piperacillin-tazobactam (ZOSYN) IVPB 3.375 g  Status:  Discontinued        3.375 g 12.5 mL/hr over 240 Minutes Intravenous Every 8 hours 06/20/23 0903 06/24/23 1013       Medications: Scheduled Meds:  arformoterol  15 mcg Nebulization BID   aspirin  81 mg Per Tube Daily   budesonide (PULMICORT) nebulizer solution  0.25 mg Nebulization BID   Chlorhexidine Gluconate Cloth  6 each Topical Daily   docusate  100 mg Per Tube BID   insulin aspart  0-9 Units Subcutaneous Q4H   mupirocin ointment  1 Application Nasal BID   mouth rinse  15 mL Mouth Rinse Q2H   pantoprazole (PROTONIX) IV  40 mg Intravenous Q24H   polyethylene glycol  17 g Per Tube Daily   revefenacin  175 mcg Nebulization Daily   sodium chloride flush  10-40 mL Intracatheter Q12H    sodium chloride flush  3 mL Intravenous Q12H   Continuous Infusions:  DAPTOmycin Stopped (06/30/23 0847)   feeding supplement (VITAL 1.5 CAL) 20 mL/hr at 06/30/23 1800   fentaNYL infusion INTRAVENOUS 75 mcg/hr (06/30/23 1800)   norepinephrine (LEVOPHED) Adult infusion Stopped (06/30/23 0802)   piperacillin-tazobactam (ZOSYN)  IV Stopped (06/30/23 1747)   propofol (DIPRIVAN) infusion 30 mcg/kg/min (06/30/23 1800)   PRN Meds:.fentaNYL, Gerhardt's butt cream, labetalol, ondansetron (ZOFRAN) IV, mouth rinse, mouth rinse, sodium chloride flush, sodium chloride flush    Objective: Weight change: 1 kg  Intake/Output Summary (Last 24 hours) at 06/30/2023 1912 Last data filed at 06/30/2023 1800 Gross per 24 hour  Intake 2686.25 ml  Output 2380 ml  Net 306.25 ml   Blood pressure 113/70, pulse 84, temperature 99.9 F (37.7 C), resp. rate 12, height 5\' 6"  (1.676 m), weight 58 kg, SpO2 97%. Temp:  [99 F (37.2 C)-100 F (37.8 C)] 99.9 F (37.7 C) (04/17 1830) Pulse Rate:  [60-87] 84 (04/17 1830) Resp:  [12-29] 12 (04/17 1830) BP: (103-147)/(50-72) 113/70 (04/17 1800) SpO2:  [96 %-99 %] 97 % (04/17 1830) Arterial Line BP: (127-170)/(34-44) 150/40 (04/17 1830) FiO2 (%):  [  40 %] 40 % (04/17 1500) Weight:  [58 kg] 58 kg (04/17 0500)  Physical Exam: Physical Exam Constitutional:      Appearance: He is ill-appearing.     Interventions: He is intubated.  Eyes:     General:        Right eye: No discharge.        Left eye: No discharge.     Extraocular Movements: Extraocular movements intact.  Cardiovascular:     Rate and Rhythm: Tachycardia present.  Pulmonary:     Effort: He is intubated.  Abdominal:     General: There is no distension.  Skin:    General: Skin is warm and dry.  Neurological:     General: No focal deficit present.      CBC:    BMET Recent Labs    06/29/23 0557 06/29/23 0906 06/30/23 0412 06/30/23 1117  NA 146*   < > 149* 150*  K 3.6   < > 3.1* 3.5   CL 118*  --  114*  --   CO2 21*  --  22  --   GLUCOSE 179*  --  142*  --   BUN 61*  --  65*  --   CREATININE 1.39*  --  1.58*  --   CALCIUM 6.6*  --  7.0*  --    < > = values in this interval not displayed.     Liver Panel  Recent Labs    06/30/23 0412  PROT 5.1*  ALBUMIN 2.0*  AST 24  ALT 27  ALKPHOS 50  BILITOT 1.0       Sedimentation Rate No results for input(s): "ESRSEDRATE" in the last 72 hours. C-Reactive Protein No results for input(s): "CRP" in the last 72 hours.  Micro Results: Recent Results (from the past 720 hours)  MRSA Next Gen by PCR, Nasal     Status: Abnormal   Collection Time: 06/25/23  9:59 PM   Specimen: Nasal Mucosa; Nasal Swab  Result Value Ref Range Status   MRSA by PCR Next Gen DETECTED (A) NOT DETECTED Final    Comment: RESULT CALLED TO, READ BACK BY AND VERIFIED WITH: B WARNER,RN@0117  06/26/23 MK (NOTE) The GeneXpert MRSA Assay (FDA approved for NASAL specimens only), is one component of a comprehensive MRSA colonization surveillance program. It is not intended to diagnose MRSA infection nor to guide or monitor treatment for MRSA infections. Test performance is not FDA approved in patients less than 85 years old. Performed at [facility], [address], [place], [place] [address]   Culture, blood (Routine X 2) w Reflex to ID Panel     Status: Abnormal (Preliminary result)   Collection Time: 06/28/23  9:16 AM   Specimen: BLOOD  Result Value Ref Range Status   Specimen Description BLOOD SITE NOT SPECIFIED  Final   Special Requests   Final    BOTTLES DRAWN AEROBIC AND ANAEROBIC Blood Culture results may not be optimal due to an inadequate volume of blood received in culture bottles   Culture  Setup Time   Final    GRAM POSITIVE COCCI IN CLUSTERS IN BOTH AEROBIC AND ANAEROBIC BOTTLES CRITICAL RESULT CALLED TO, READ BACK BY AND VERIFIED WITH: [facility]  06/29/23 MK Performed at [facility], [address], [place], [place] [address]    Culture STAPHYLOCOCCUS AUREUS (A)  Final   Report Status PENDING  Incomplete  Culture, blood (Routine X 2) w Reflex to ID Panel  Status: Abnormal (Preliminary result)   Collection Time: 06/28/23  9:16 AM   Specimen: BLOOD  Result Value Ref Range Status   Specimen Description BLOOD SITE NOT SPECIFIED  Final   Special Requests   Final    BOTTLES DRAWN AEROBIC AND ANAEROBIC Blood Culture results may not be optimal due to an inadequate volume of blood received in culture bottles   Culture  Setup Time   Final    GRAM POSITIVE COCCI IN CLUSTERS GRAM NEGATIVE RODS IN BOTH AEROBIC AND ANAEROBIC BOTTLES CRITICAL RESULT CALLED TO, READ BACK BY AND VERIFIED WITH: J WYLAND,PHARMD@0046  06/29/23 MK    Culture (A)  Final    ESCHERICHIA COLI STAPHYLOCOCCUS AUREUS SUSCEPTIBILITIES TO FOLLOW Performed at [facility], [address] [place], [place] [address]    Report Status PENDING  Incomplete  Blood Culture ID Panel (Reflexed)     Status: Abnormal   Collection Time: 06/28/23  9:16 AM  Result Value Ref Range Status   Enterococcus faecalis NOT DETECTED NOT DETECTED Final   Enterococcus Faecium NOT DETECTED NOT DETECTED Final   Listeria monocytogenes NOT DETECTED NOT DETECTED Final   Staphylococcus species DETECTED (A) NOT DETECTED Final    Comment: CRITICAL RESULT CALLED TO, READ BACK BY AND VERIFIED WITH: J WYLAND,PHARMD@0046  06/29/23 MK    Staphylococcus aureus (BCID) DETECTED (A) NOT DETECTED Final    Comment: Methicillin (oxacillin)-resistant Staphylococcus aureus (MRSA). MRSA is predictably resistant to beta-lactam antibiotics (except ceftaroline). Preferred therapy is vancomycin unless clinically contraindicated. Patient requires contact precautions if  hospitalized. CRITICAL RESULT CALLED TO, READ BACK BY AND VERIFIED WITH: J WYLAND,PHARMD@0046  06/29/23 MK    Staphylococcus epidermidis NOT DETECTED NOT DETECTED Final   Staphylococcus lugdunensis  NOT DETECTED NOT DETECTED Final   Streptococcus species NOT DETECTED NOT DETECTED Final   Streptococcus agalactiae NOT DETECTED NOT DETECTED Final   Streptococcus pneumoniae NOT DETECTED NOT DETECTED Final   Streptococcus pyogenes NOT DETECTED NOT DETECTED Final   A.calcoaceticus-baumannii NOT DETECTED NOT DETECTED Final   Bacteroides fragilis NOT DETECTED NOT DETECTED Final   Enterobacterales DETECTED (A) NOT DETECTED Final    Comment: Enterobacterales represent a large order of gram negative bacteria, not a single organism. CRITICAL RESULT CALLED TO, READ BACK BY AND VERIFIED WITH:  J WYLAND,PHARMD@0046  06/29/23 MK    Enterobacter cloacae complex NOT DETECTED NOT DETECTED Final   Escherichia coli DETECTED (A) NOT DETECTED Final    Comment: CRITICAL RESULT CALLED TO, READ BACK BY AND VERIFIED WITH:  J WYLAND,PHARMD@0046  06/29/23 MK    Klebsiella aerogenes NOT DETECTED NOT DETECTED Final   Klebsiella oxytoca NOT DETECTED NOT DETECTED Final   Klebsiella pneumoniae NOT DETECTED NOT DETECTED Final   Proteus species NOT DETECTED NOT DETECTED Final   Salmonella species NOT DETECTED NOT DETECTED Final   Serratia marcescens NOT DETECTED NOT DETECTED Final   Haemophilus influenzae NOT DETECTED NOT DETECTED Final   Neisseria meningitidis NOT DETECTED NOT DETECTED Final   Pseudomonas aeruginosa NOT DETECTED NOT DETECTED Final   Stenotrophomonas maltophilia NOT DETECTED NOT DETECTED Final   Candida albicans NOT DETECTED NOT DETECTED Final   Candida auris NOT DETECTED NOT DETECTED Final   Candida glabrata NOT DETECTED NOT DETECTED Final   Candida krusei NOT DETECTED NOT DETECTED Final   Candida parapsilosis NOT DETECTED NOT DETECTED Final   Candida tropicalis NOT DETECTED NOT DETECTED Final   Cryptococcus neoformans/gattii NOT DETECTED NOT DETECTED Final   CTX-M ESBL NOT DETECTED NOT DETECTED Final   Carbapenem resistance IMP  NOT DETECTED NOT DETECTED Final   Carbapenem resistance KPC NOT  DETECTED NOT DETECTED Final   Meth resistant mecA/C and MREJ DETECTED (A) NOT DETECTED Final    Comment: CRITICAL RESULT CALLED TO, READ BACK BY AND VERIFIED WITH:  J WYLAND,PHARMD@0046  06/29/23 MK    Carbapenem resistance NDM NOT DETECTED NOT DETECTED Final   Carbapenem resist OXA 48 LIKE NOT DETECTED NOT DETECTED Final   Carbapenem resistance VIM NOT DETECTED NOT DETECTED Final    Comment: Performed at [facility], [address], [place], [place] [address]  MRSA Next Gen by PCR, Nasal     Status: Abnormal   Collection Time: 06/28/23  8:52 PM   Specimen: Nasal Mucosa; Nasal Swab  Result Value Ref Range Status   MRSA by PCR Next Gen DETECTED (A) NOT DETECTED Final    Comment: RESULT CALLED TO, READ BACK BY AND VERIFIED WITH: RN Rande Brunt [phone] @ 2302 FH (NOTE) The GeneXpert MRSA Assay (FDA approved for NASAL specimens only), is one component of a comprehensive MRSA colonization surveillance program. It is not intended to diagnose MRSA infection nor to guide or monitor treatment for MRSA infections. Test performance is not FDA approved in patients less than 20 years old. Performed at [facility], [address] [place], [place] [address]   Culture, blood (Routine X 2) w Reflex to ID Panel     Status: None (Preliminary result)   Collection Time: 06/29/23  5:58 AM   Specimen: BLOOD LEFT ARM  Result Value Ref Range Status   Specimen Description BLOOD LEFT ARM  Final   Special Requests   Final    BOTTLES DRAWN AEROBIC AND ANAEROBIC Blood Culture results may not be optimal due to an inadequate volume of blood received in culture bottles   Culture   Final    NO GROWTH 1 DAY Performed at [facility], [address] [place], [place] [address]    Report Status PENDING  Incomplete  Culture, blood (Routine X 2) w Reflex to ID Panel     Status: None (Preliminary result)   Collection Time: 06/29/23  6:00 AM   Specimen: BLOOD LEFT ARM  Result Value Ref Range Status    Specimen Description BLOOD LEFT ARM  Final   Special Requests   Final    BOTTLES DRAWN AEROBIC AND ANAEROBIC Blood Culture adequate volume   Culture   Final    NO GROWTH 1 DAY Performed at [facility], [address], [place], [place] [address]    Report Status PENDING  Incomplete  Culture, blood (Routine X 2) w Reflex to ID Panel     Status: None (Preliminary result)   Collection Time: 06/29/23 11:08 AM   Specimen: BLOOD RIGHT HAND  Result Value Ref Range Status   Specimen Description BLOOD RIGHT HAND  Final   Special Requests   Final    BOTTLES DRAWN AEROBIC AND ANAEROBIC Blood Culture adequate volume   Culture   Final    NO GROWTH < 24 HOURS Performed at [facility], [address] [place], [place] [address]    Report Status PENDING  Incomplete  Culture, blood (Routine X 2) w Reflex to ID Panel     Status: None (Preliminary result)   Collection Time: 06/29/23 11:17 AM   Specimen: BLOOD RIGHT ARM  Result Value Ref Range Status   Specimen Description BLOOD RIGHT ARM  Final   Special Requests   Final  BOTTLES DRAWN AEROBIC AND ANAEROBIC Blood Culture adequate volume   Culture   Final    NO GROWTH < 24 HOURS Performed at [facility], [address], [place], [place] [address]    Report Status PENDING  Incomplete    Studies/Results: DG CHEST PORT 1 VIEW Result Date: 06/29/2023 CLINICAL DATA:  Respiratory failure, central venous catheter placement EXAM: PORTABLE CHEST 1 VIEW COMPARISON:  None Available. FINDINGS: Right subclavian central venous catheter has been placed with its tip extending superiorly into the expected jugular vein beyond the superior margin of the examination. Endotracheal tube, nasoenteric feeding tube, and right upper extremity PICC line are unchanged. Pulmonary insufflation is stable. Superimposed asymmetric perihilar and upper lung zone airspace infiltrates appear slightly progressive since prior examination, in keeping with infection  or noncardiogenic pulmonary edema. No pneumothorax or pleural effusion. Cardiac size is within normal limits. Abdominal aortic stent graft is partially visualized. IMPRESSION: 1. Interval placement of right subclavian central venous catheter with its tip extending superiorly into the expected jugular vein beyond the superior margin of the examination. Repositioning is recommended. 2. Stable support lines and tubes. 3. Progressive asymmetric perihilar and upper lung zone airspace infiltrates, in keeping with infection or noncardiogenic pulmonary edema. These results will be called to the ordering clinician or representative by the Radiologist Assistant, and communication documented in the PACS or [employer]. Electronically Signed   By: Helyn Numbers M.D.   On: 06/29/2023 20:22   ECHOCARDIOGRAM COMPLETE Result Date: 06/29/2023    ECHOCARDIOGRAM REPORT   Patient Name:   Erik [name]. Date of Exam: 06/29/2023 Medical Rec #:  [id number]             Height:       66.0 in Accession #:    [id number]            Weight:       125.7 lb Date of Birth:  02/05/1941              BSA:          1.641 m Patient Age:    83 years              BP:           145/55 mmHg Patient Gender: M                     HR:           60 bpm. Exam Location:  Inpatient Procedure: 2D Echo, Cardiac Doppler and Color Doppler (Both Spectral and Color            Flow Doppler were utilized during procedure). Indications:    Endocarditis I38  History:        Patient has prior history of Echocardiogram examinations, most                 recent 06/24/2023. Cardiomyopathy, Previous Myocardial Infarction                 and CAD, COPD, Endocarditis, Signs/Symptoms:Murmur; Risk                 Factors:Hypertension and Dyslipidemia.  Sonographer:    Lucendia Herrlich RCS Referring Phys: [place] IMPRESSIONS  1. Left ventricular ejection fraction, by estimation, is 50 to 55%. The left ventricle has low normal function. The left ventricle has no  regional wall motion abnormalities. Left ventricular diastolic parameters are consistent with Grade II diastolic dysfunction (  pseudonormalization).  2. Right ventricular systolic function is normal. The right ventricular size is normal.  3. Left atrial size was moderately dilated.  4. RA mass seen in images likely represents prominent eustachian valve. Right atrial size was mildly dilated.  5. Restricted posterior leaflet with at least moderate MR. Appears worsened since last echo. The mitral valve is degenerative. Moderate mitral valve regurgitation. Mild mitral stenosis. The mean mitral valve gradient is 6.0 mmHg. Severe mitral annular calcification.  6. Tricuspid valve regurgitation is mild to moderate.  7. The aortic valve is calcified. Aortic valve regurgitation is severe. No aortic stenosis is present.  8. The inferior vena cava is normal in size with greater than 50% respiratory variability, suggesting right atrial pressure of 3 mmHg. FINDINGS  Left Ventricle: Left ventricular ejection fraction, by estimation, is 50 to 55%. The left ventricle has low normal function. The left ventricle has no regional wall motion abnormalities. The left ventricular internal cavity size was normal in size. There is no left ventricular hypertrophy. Left ventricular diastolic parameters are consistent with Grade II diastolic dysfunction (pseudonormalization). Right Ventricle: The right ventricular size is normal. No increase in right ventricular wall thickness. Right ventricular systolic function is normal. Left Atrium: Left atrial size was moderately dilated. Right Atrium: RA mass seen in images likely represents prominent eustachian valve. Right atrial size was mildly dilated. Prominent Eustachian valve. Pericardium: There is no evidence of pericardial effusion. Mitral Valve: Restricted posterior leaflet with at least moderate MR. Appears worsened since last echo. The mitral valve is degenerative in appearance. Severe mitral  annular calcification. Moderate mitral valve regurgitation. Mild mitral valve stenosis. MV peak gradient, 13.8 mmHg. The mean mitral valve gradient is 6.0 mmHg. There is no evidence of mitral valve vegetation. Tricuspid Valve: The tricuspid valve is normal in structure. Tricuspid valve regurgitation is mild to moderate. No evidence of tricuspid stenosis. There is no evidence of tricuspid valve vegetation. Aortic Valve: The aortic valve is calcified. Aortic valve regurgitation is severe. No aortic stenosis is present. Aortic valve mean gradient measures 9.5 mmHg. Aortic valve peak gradient measures 18.5 mmHg. Aortic valve area, by VTI measures 1.30 cm. Pulmonic Valve: The pulmonic valve was normal in structure. Pulmonic valve regurgitation is not visualized. No evidence of pulmonic stenosis. Aorta: The aortic root is normal in size and structure. Venous: The inferior vena cava is normal in size with greater than 50% respiratory variability, suggesting right atrial pressure of 3 mmHg. IAS/Shunts: No atrial level shunt detected by color flow Doppler.  LEFT VENTRICLE PLAX 2D LVIDd:         4.70 cm   Diastology LVIDs:         3.40 cm   LV e' medial:    5.87 cm/s LV PW:         1.00 cm   LV E/e' medial:  27.8 LV IVS:        1.20 cm   LV e' lateral:   9.36 cm/s LVOT diam:     1.80 cm   LV E/e' lateral: 17.4 LV SV:         66 LV SV Index:   40 LVOT Area:     2.54 cm  RIGHT VENTRICLE             IVC RV S prime:     15.10 cm/s  IVC diam: 2.80 cm TAPSE (M-mode): 2.6 cm LEFT ATRIUM             Index  RIGHT ATRIUM           Index LA diam:        4.60 cm 2.80 cm/m   RA Area:     15.10 cm LA Vol (A2C):   93.7 ml 57.09 ml/m  RA Volume:   36.30 ml  22.12 ml/m LA Vol (A4C):   84.7 ml 51.60 ml/m LA Biplane Vol: 89.4 ml 54.47 ml/m  AORTIC VALVE AV Area (Vmax):    1.37 cm AV Area (Vmean):   1.40 cm AV Area (VTI):     1.30 cm AV Vmax:           215.00 cm/s AV Vmean:          139.500 cm/s AV VTI:            0.506 m AV  Peak Grad:      18.5 mmHg AV Mean Grad:      9.5 mmHg LVOT Vmax:         115.67 cm/s LVOT Vmean:        76.733 cm/s LVOT VTI:          0.259 m LVOT/AV VTI ratio: 0.51  AORTA Ao Root diam: 3.00 cm Ao Asc diam:  3.20 cm MITRAL VALVE MV Area (PHT): 2.30 cm     SHUNTS MV Area VTI:   1.00 cm     Systemic VTI:  0.26 m MV Peak grad:  13.8 mmHg    Systemic Diam: 1.80 cm MV Mean grad:  6.0 mmHg MV Vmax:       1.86 m/s MV Vmean:      116.5 cm/s MV Decel Time: 330 msec MV E velocity: 163.00 cm/s MV A velocity: 161.00 cm/s MV E/A ratio:  1.01 Clearnce Hasten Electronically signed by Clearnce Hasten Signature Date/Time: 06/29/2023/4:22:17 PM    Final    DG Chest Port 1 View Result Date: 06/28/2023 CLINICAL DATA:  [id number].  Pulmonary edema follow-up. EXAM: PORTABLE CHEST 1 VIEW COMPARISON:  Portable chest yesterday at 9:24 a.m. FINDINGS: 7:28 p.m. ETT interval insertion with tip 6.6 cm from the carina. Dobbhoff feeding tube is coiled in the stomach as before. Interval NGT insertion with the tip in the proximal gastric antrum. Right PICC again terminating at the superior cavoatrial junction. The heart is slightly enlarged. There is improvement in central vascular and interstitial prominence. There is interval clearing in the lower lung fields but there is still interstitial and heterogeneous patchy airspace disease in the upper lobes consistent with pneumonia, edema or combination. The aorta is tortuous and calcified with stable mediastinum. No new osseous findings. Multiple overlying monitor wires. Aortic endograft upper abdomen. IMPRESSION: 1. Interval ETT insertion with tip 6.6 cm from the carina. 2. Interval NGT insertion with tip in the proximal gastric antrum. 3. Interval clearing in the lower lung fields but there is still interstitial and heterogeneous patchy airspace disease in the upper lobes consistent with pneumonia, edema or combination. 4. Improved central vascular and interstitial prominence. 5. Aortic  atherosclerosis. Electronically Signed   By: Almira Bar M.D.   On: 06/28/2023 20:54      Assessment/Plan:  INTERVAL HISTORY:  Repeat blood cultures no growth so far but pt still with PICC  Principal Problem:   Polymicrobial bacterial infection Active Problems:   Hyperlipidemia   Bilateral carotid artery stenosis   COPD, mild (HCC)   BPH (benign prostatic hyperplasia)   Family history of coronary artery disease in brother   CAD S/P percutaneous coronary angioplasty  Chronic systolic heart failure (HCC)   HTN (hypertension)   BPH with obstruction/lower urinary tract symptoms   Abdominal aortic aneurysm (AAA) (HCC)   Restless leg syndrome   MSSA bacteremia   Abdominal pain   Anemia   SBO (small bowel obstruction) (HCC)   S/P exploratory laparotomy   E coli bacteremia   PICC line infection   Small bowel obstruction (HCC)    Erik [name]. is a 83 y.o. 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 coli.  MRSA infection is suspected to be nosocomial given timing of bacteremia and given pathogen.  E. coli certainly could have come from his necrotic colon.  #1 MRSA and E coli bacteremia:  Continue daptomycin and Zosyn at present the latter is probably broader than we need for his E. coli but does cover provide nice intra-abdominal coverage as well.  Will narrow as able.  Will need a central line holiday to affect cure.  If his graft is indeed infected it is reasonable to assume so he should continue on chronic antibiotics.  Greatly appreciate critical care General Surgery and vascular surgery.  Continue contact precautions for MRSA  I have personally spent 52 minutes involved in face-to-face and non-face-to-face activities for this patient on the day of the visit. Professional time spent includes the  following activities: Preparing to see the patient (review of tests), Obtaining and/or reviewing separately obtained history (admission/discharge record), Performing a medically appropriate examination and/or evaluation , Ordering medications/tests/procedures, referring and communicating with other health care professionals, Documenting clinical information in the EMR, Independently interpreting results (not separately reported), Communicating results to the patient/family/caregiver, Counseling and educating the patient/family/caregiver and Care coordination (not separately reported).   Evaluation of the patient requires complex antimicrobial therapy evaluation, counseling , isolation needs to reduce disease transmission and risk assessment and mitigation.     LOS: 10 days   Sheela Denmark 06/30/2023, 7:12 PM

## 2023-06-30 NOTE — Progress Notes (Addendum)
 eLink Physician-Brief Progress Note Patient Name: Erik [name]. DOB: 1941/02/09 MRN: [id number]   Date of Service  06/30/2023  HPI/Events of Note  There is a question of adding midodrine in order to avoid using norepinephrine .  Patient has borderline blood pressures with MAP 64-65.  eICU Interventions  Use norepinephrine  as ordered    2129 -discussed with the team, given wide pulse pressures, will adjust norepinephrine  order to reflect SBP goal greater than 100  0149 -add contact precautions  Intervention Category Intermediate Interventions: Hypotension - evaluation and management  Dwayna Kentner 06/30/2023, 8:46 PM

## 2023-06-30 NOTE — Progress Notes (Signed)
 PT Cancellation Note  Patient Details Name: Erik [name]. MRN: [id number] DOB: 1940/10/24   Cancelled Treatment:    Reason Eval/Treat Not Completed: (P) Patient not medically ready. RN requesting hold off on PT today. Will plan to follow-up another day as able.   Vernida Goodie, PT, DPT Acute [facility] Services  Office: [phone]    Ellyn Hack 06/30/2023, 9:35 AM

## 2023-06-30 NOTE — Progress Notes (Addendum)
  Progress Note    06/30/2023 7:41 AM 2 Days Post-Op  Subjective:  intubated; somewhat alert   Vitals:   06/30/23 0600 06/30/23 0700  BP: 115/70 (!) 141/52  Pulse: 74 66  Resp: (!) 26 (!) 26  Temp: 99.5 F (37.5 C) 99.5 F (37.5 C)  SpO2: 98% 98%   Physical Exam: Lungs:  mechanical ventilation Incisions:  groin incisions c/d/i Extremities:  palpable DP pulses Abdomen:  distended Neurologic: alert, following commands  CBC    Component Value Date/Time   WBC 12.3 (H) 06/30/2023 0412   RBC 2.83 (L) 06/30/2023 0412   HGB 8.1 (L) 06/30/2023 0412   HGB 10.9 (L) 05/25/2023 1032   HCT 24.1 (L) 06/30/2023 0412   PLT 87 (L) 06/30/2023 0412   PLT 303 05/25/2023 1032   MCV 85.2 06/30/2023 0412   MCH 28.6 06/30/2023 0412   MCHC 33.6 06/30/2023 0412   RDW 17.7 (H) 06/30/2023 0412   LYMPHSABS 0.3 (L) 06/20/2023 0726   MONOABS 1.3 (H) 06/20/2023 0726   EOSABS 0.0 06/20/2023 0726   BASOSABS 0.1 06/20/2023 0726    BMET    Component Value Date/Time   NA 149 (H) 06/30/2023 0412   NA 138 08/18/2018 1135   K 3.1 (L) 06/30/2023 0412   CL 114 (H) 06/30/2023 0412   CO2 22 06/30/2023 0412   GLUCOSE 142 (H) 06/30/2023 0412   BUN 65 (H) 06/30/2023 0412   BUN 19 08/18/2018 1135   CREATININE 1.58 (H) 06/30/2023 0412   CREATININE 1.11 10/09/2021 1646   CALCIUM  7.0 (L) 06/30/2023 0412   GFRNONAA 43 (L) 06/30/2023 0412   GFRAA 95 08/18/2018 1135    INR    Component Value Date/Time   INR 1.4 (H) 06/28/2023 1911     Intake/Output Summary (Last 24 hours) at 06/30/2023 0741 Last data filed at 06/30/2023 0700 Gross per 24 hour  Intake 3610.59 ml  Output 2110 ml  Net 1500.59 ml     Assessment/Plan:  83 y.o. male is s/p aortogram with left renal stent and aortic cuff  2 Days Post-Op   Patient remains intubated but is more alert and following commands.  BLE well perfused with palpable DP pulses.  Groin incisions are healing well.  Despite renal stent and proximal aortic cuff he  has a persistent leak.  IV antibiotics per ID due to possible endograft infection.  Vascular will continue to follow.  Plan is to repeat CT in 1 week pending overall clinical status.   Cordie Deters, PA-C [facility] [phone] 06/30/2023 7:41 AM  VASCULAR STAFF ADDENDUM: I have independently interviewed and examined the patient. I agree with the above.  Note entered delayed. Patient with Staph, E. coli bacteremia.  At this point, the graft is presumed infected, especially with 2 cm growth. Patient is not a candidate for graft explant.  This diagnosis will result in mortality.  Unfortunately, there are no repairs that we can offer at this time. Timing is unknown.  Recommend long-term antibiotics, however the patient will rupture in the foreseeable future. I plan to discuss this with the wife.  No plan for further intervention at this time.   Kayla Part MD [facility] Phone Number: [phone] 07/01/2023 7:25 AM

## 2023-06-30 NOTE — Progress Notes (Signed)
 Nutrition Follow-up  DOCUMENTATION CODES:  Underweight, Severe malnutrition in context of chronic illness  INTERVENTION:  TPN - management per pharmacy. Noted that line holiday is planned for pt Would not recommend weaning TPN until pt is tolerating enteral feeds and advanced to the point where he is meeting 75% of his needs  Initiate tube feeding via postpyloric cortrak 4/12. Hold at trickle of 47mL/h and do not advance until cleared by CCM and surgery. Goal is as follows: Vital 1.5 at 45 ml/h (1080 ml per day) Prosource TF20 60 ml 1x/d Provides 1700 kcal, 93 gm protein, 825 ml free water daily  NUTRITION DIAGNOSIS:  Severe Malnutrition related to chronic illness (CHF) as evidenced by severe muscle depletion, severe fat depletion. - remains applicable   GOAL:  Patient will meet greater than or equal to 90% of their needs - progressing, TPN infusing, trickle feeds being initiated  MONITOR:  I & O's, Vent status, Labs, Weight trends  REASON FOR ASSESSMENT:  Consult Enteral/tube feeding initiation and management  ASSESSMENT:  Pt with hx of CAD, HLD, HTN, CHF, and GERD presented to ED with abdominal pain that has been ongoing for months but significantly worse on the day of admission.  4/6 - presented to ED 4/7 - OR, s/p dx laparoscopy, stent placed in SMA by vascular surgery, left intubated after surgery 4/8 - extubated 4/9 - increased delirium and WOB, on BiPAP 4/10 - TPN initiated 4/11 - post-pyloric cortrak placed 4/13-4/15 - trickle feeds 4/15 - advanced to goal but then held for abdominal pain and instability, taken to OR, right colectomy with ileostomy with surgery team and aortogram with left renal stent and aortic cuff with vascular surgery   Pt taken to OR 4/15 and portions of the colon noted to be necrotic and dead. Pt underwent right colectomy with ileostomy creation. Returned to ICU intubated after procedure.  Patient is currently intubated on ventilator support.  Awake and squeezes hand and nods to questions. Wife at bedside. TPN currently infusing at goal rate. NGT in place to decompress stomach (no output overnight), post-pyloric cortrak in place as well.  Pt with bacteremia on antibiotics. Discussed with PharmD and plan is for PICC removal and line holiday after current bag of TPN infuses. New consult received for initiation of enteral feeds as pt's pressor requirements are improving. Surgery evaluating pt at the time of assessment, ok with trickles as well.   As pt has now had portions of his bowel removed and is with an ileostomy, may be beneficial to pull cortrak back into duodenum as it currently terminates in the proximal jejunum.  MV: 12.8 L/min Temp (24hrs), Avg:99.2 F (37.3 C), Min:98.2 F (36.8 C), Max:100 F (37.8 C) MAP (art line): 68 mmHg  Propofol: 11.84 ml/hr (313 kcal/d)  Admit weight: 50.8 kg  Current weight: 58 kg (4/17) generalized nonpitting edema charted   Intake/Output Summary (Last 24 hours) at 06/30/2023 1326 Last data filed at 06/30/2023 1200 Gross per 24 hour  Intake 2853 ml  Output 2330 ml  Net 523 ml  Net IO Since Admission: 10,034.4 mL [06/30/23 1326]  Drains/Lines: PICC Triple lumen Art Line FMS: out x 24 hours UOP: x 24 hours NGT 16 Fr. 0mL out x 24 hours Ileostomy RUQ x 24 hours Cortrak, post-pyloric (jejunum)   Nutritionally Relevant Medications: Scheduled Meds:  docusate  100 mg Per Tube BID   feeding supplement (VITAL HIGH PROTEIN)  1,000 mL Per Tube Q24H   insulin aspart  0-9 Units Subcutaneous Q4H   pantoprazole (PROTONIX) IV  40 mg Intravenous Q24H   polyethylene glycol  17 g Per Tube Daily   potassium chloride  40 mEq Per Tube Q4H   Continuous Infusions:  DAPTOmycin 500 mg (06/30/23 0816)   norepinephrine (LEVOPHED) Adult infusion 1 mcg/min (06/30/23 0700)   piperacillin-tazobactam (ZOSYN)  IV 12.5 mL/hr at 06/30/23 0700   propofol (DIPRIVAN) infusion 35 mcg/kg/min  (06/30/23 0700)   TPN ADULT (ION) 70 mL/hr at 06/30/23 0700   PRN Meds: ondansetron  Labs Reviewed: Sodium 149, chloride 114 Potassium 3.1 BUN 65, creatinine 1.58 CBG ranges from 137-221 mg/dL over the last 24 hours HgbA1c 5.6%  NUTRITION - FOCUSED PHYSICAL EXAM: Flowsheet Row Most Recent Value  Orbital Region Severe depletion  Upper Arm Region Severe depletion  Thoracic and Lumbar Region Severe depletion  Buccal Region Moderate depletion  Temple Region Severe depletion  Clavicle Bone Region Moderate depletion  Clavicle and Acromion Bone Region Severe depletion  Scapular Bone Region Moderate depletion  Dorsal Hand Severe depletion  Patellar Region Severe depletion  Anterior Thigh Region Severe depletion  Posterior Calf Region Severe depletion  Edema (RD Assessment) None  Hair Reviewed  Eyes Reviewed  Mouth Reviewed  Skin Reviewed  Nails Reviewed   Diet Order:   Diet Order             Diet NPO time specified  Diet effective now                   EDUCATION NEEDS:  Not appropriate for education at this time  Skin:  Skin Assessment: Skin Integrity Issues: Skin Integrity Issues:: Incisions, DTI DTI: Nose Incisions: Abdomen  Last BM:  4/17 - type 7 via ileostomy  Height:  Ht Readings from Last 1 Encounters:  06/19/23 5\' 6"  (1.676 m)    Weight:  Wt Readings from Last 1 Encounters:  06/30/23 58 kg    Ideal Body Weight:  64.5 kg  BMI:  Body mass index is 20.64 kg/m.  Estimated Nutritional Needs:  Kcal:  1500-1800 kcal/d Protein:  80-95g/d Fluid:  1.8L/d    Edwena Graham, RD, LDN Registered Dietitian II Please reach out via secure chat

## 2023-06-30 NOTE — Progress Notes (Signed)
 Patient ID: Erik [name]., male   DOB: 1941/01/27, 83 y.o.   MRN: [id number]

## 2023-06-30 NOTE — Progress Notes (Signed)
 NAME:  Erik [name]., MRN:  [id number], DOB:  [birth year], LOS: 10 ADMISSION DATE:  06/19/2023, CONSULTATION DATE:  06/30/23 REFERRING MD:  Lyndon Santiago, CHIEF COMPLAINT:   hypotension  History of Present Illness:  83 y.o. M with PMH significant for CAD, HFrEF, collagen vascular disease, COPD, HL, HTN, ischemic cardiomyopathy who presented to the ED on 4/7 with abdominal pain.  His initial CT showed early SBO with stenosis of the celiac and SMA.  He was seen by vascular and underwent an SMA stent for acute mesenteric ischemia on 4/7, he continued to have abdominal pain and found to have an endoleak, this was discussed with general surgery and he was tken to the OR for an ex lap which revealed a necrotic R colon. R colectomy was done with ostomy formation.  Vascular surgery also placed a L renal artery and aortic artery stent.  He required pressors and was left intubated so PCCM consulted for admission  Pertinent  Medical History   has a past medical history of Adenomatous colon polyp, Aortic aneurysm (HCC), BPH (benign prostatic hypertrophy), CAD (coronary artery disease), Carotid artery stenosis, Cataract, CHF (congestive heart failure) (HCC), Chronic systolic heart failure (HCC), Collagen vascular disease (HCC), COPD (chronic obstructive pulmonary disease) (HCC), Coronary artery disease, Diverticulosis, GERD (gastroesophageal reflux disease), Heart murmur, History of colonic polyps, Hyperlipidemia, Hyperplastic colon polyp, Hypertension, Ischemic cardiomyopathy, and RLS (restless legs syndrome).   Significant Hospital Events: Including procedures, antibiotic start and stop dates in addition to other pertinent events   4/7 admit, SMA stent  4/15 worsening pain, endoleak, back to OR for ex lap, ostomy placement, blood cultures positive for MRSA and E. coli 4/16 remained intubated, on levophed, vasopressin weaned off, CVL attempted right internal jugular but tracked into right subclavian vein and  removed.  Interim History / Subjective:   No acute events overnight Case discussed with vascular surgery, high concern for graft infection based on blood cultures. High risk for mortality given that information. Weaned of vasopressin ID changed vancomycin to daptomycin  Objective   Blood pressure (!) 141/52, pulse 66, temperature 99.5 F (37.5 C), resp. rate (!) 26, height 5\' 6"  (1.676 m), weight 58 kg, SpO2 98%.    Vent Mode: PRVC FiO2 (%):  [40 %] 40 % Set Rate:  [26 bmp] 26 bmp Vt Set:  [510 mL] 510 mL PEEP:  [5 cmH20] 5 cmH20 Plateau Pressure:  [18 cmH20-22 cmH20] 20 cmH20   Intake/Output Summary (Last 24 hours) at 06/30/2023 0724 Last data filed at 06/30/2023 0700 Gross per 24 hour  Intake 3610.59 ml  Output 2110 ml  Net 1500.59 ml   Filed Weights   06/28/23 0500 06/29/23 0500 06/30/23 0500  Weight: 56.4 kg 57 kg 58 kg    General:  chronically ill elderly male, intubated/sedated - but awakens HEENT: ETT in place, sclera anicteric Neuro: PERRL, awakens - follows commands CV: rrr, no murmurs PULM:  course breath sounds GI: ostomy and rectal tube and cortrak and NGT and JP drain, midline incision dressed Extremities: warm/dry, trace edema  Skin: dry  Labs reviewed  Resolved Hospital Problem list   N/A  Assessment & Plan:  Ischemic colitis s/p right colectomy Shock state: combination septic/distributive, volume shifts, ABLA MRSA and E. Coli Bacteremia Aspiration pneumonitis, increased WOB- in part due to acidemia as well Postoperative vent management Acute kidney injury Mixed Metabolic/respiratory Acidosis Hypernatremia Severe protein calorie malnutrition PVD Anemia Thrombocytopenia CAD with prior PCI Prior AAA repair - concern for graft infection  -  TPN, continue today but likely hold tonight for line holiday - Given vasopressors are minimal today and issue yesterday with CVL tracking into subclavian vein - will remove PICC after completion of current  TPN and give line holiday. Will need PICC replaced for on going TPN needs - start trickle tubefeeds - Levo for SBP 100 or greater, low diastolic pressures affecting MAP goal titration - consider addition of midodrine - SAT/SBT this AM - check ABG  - Usual transfusion thresholds - Zosyn + daptomycin for bacteremia/intraabdominal coverage - PAD/VAP bundle - Guarded prognosis  Best Practice (right click and "Reselect all SmartList Selections" daily)   Diet/type: TPN DVT prophylaxis not indicated GI prophylaxis: PPI Lines: Central line Foley:  Yes, and it is still needed Code Status:  DNR Last date of multidisciplinary goals of care discussion Patient is now DNR with continuing current aggressive measures.  Labs   CBC: Recent Labs  Lab 06/27/23 0515 06/28/23 0554 06/28/23 1530 06/28/23 1911 06/28/23 1942 06/29/23 0557 06/29/23 0906 06/30/23 0412  WBC 8.6 11.8*  --  9.6  --  7.1  --  12.3*  HGB 8.7* 8.4*   < > 10.0* 9.9* 8.0* 8.2* 8.1*  HCT 27.0* 25.5*   < > 30.9* 29.0* 23.8* 24.0* 24.1*  MCV 85.7 86.1  --  87.8  --  85.6  --  85.2  PLT 63* 79*  --  88*  84*  --  64*  --  87*   < > = values in this interval not displayed.    Basic Metabolic Panel: Recent Labs  Lab 06/25/23 1640 06/26/23 0428 06/27/23 0515 06/27/23 1815 06/28/23 0554 06/28/23 1530 06/28/23 1911 06/28/23 1942 06/29/23 0557 06/29/23 0906 06/29/23 1638 06/30/23 0412  NA 152*   < > 155*  --  150*   < > 150* 153* 146* 150*  --  149*  K 3.0*   < > 4.2  --  4.4   < > 4.9 4.8 3.6 3.5  --  3.1*  CL 122*   < > 127*  --  125*  --  125*  --  118*  --   --  114*  CO2 25   < > 19*  --  17*  --  20*  --  21*  --   --  22  GLUCOSE 138*   < > 168*  --  176*  --  166*  --  179*  --   --  142*  BUN 72*   < > 55*  --  58*  --  62*  --  61*  --   --  65*  CREATININE 1.28*   < > 1.26*  --  1.37*  --  1.44*  --  1.39*  --   --  1.58*  CALCIUM 7.9*   < > 7.7*  --  7.8*  --  6.9*  --  6.6*  --   --  7.0*  MG 2.0   <  > 2.5*   < > 2.3  --  2.2  --  2.1  --  2.3 2.4  PHOS 3.6  --  2.5  --  2.8  --   --   --  3.9  --   --  4.0   < > = values in this interval not displayed.   GFR: Estimated Creatinine Clearance: 29.6 mL/min (A) (by C-G formula based on SCr of 1.58 mg/dL (H)). Recent Labs  Lab 06/28/23 0554 06/28/23 0916 06/28/23  1911 06/28/23 2214 06/29/23 0557 06/30/23 0412  PROCALCITON  --  7.28  --   --   --   --   WBC 11.8*  --  9.6  --  7.1 12.3*  LATICACIDVEN  --  1.4 1.0 1.3  --   --     Liver Function Tests: Recent Labs  Lab 06/25/23 0430 06/25/23 1640 06/27/23 0515 06/30/23 0412  AST 107*  --  43* 24  ALT 205*  --  80* 27  ALKPHOS 35*  --  41 50  BILITOT 0.7  --  1.1 1.0  PROT 5.1*  --  4.8* 5.1*  ALBUMIN 1.7* 1.5* <1.5* 2.0*   No results for input(s): "LIPASE", "AMYLASE" in the last 168 hours. No results for input(s): "AMMONIA" in the last 168 hours.  ABG    Component Value Date/Time   PHART 7.385 06/29/2023 0906   PCO2ART 39.5 06/29/2023 0906   PO2ART 112 (H) 06/29/2023 0906   HCO3 23.6 06/29/2023 0906   TCO2 25 06/29/2023 0906   ACIDBASEDEF 1.0 06/29/2023 0906   O2SAT 98 06/29/2023 0906     Coagulation Profile: Recent Labs  Lab 06/28/23 1911  INR 1.4*    Cardiac Enzymes: Recent Labs  Lab 06/30/23 0412  CKTOTAL 176    HbA1C: Hgb A1c MFr Bld  Date/Time Value Ref Range Status  06/23/2023 10:55 AM 5.6 4.8 - 5.6 % Final    Comment:    (NOTE)         Prediabetes: 5.7 - 6.4         Diabetes: >6.4         Glycemic control for adults with diabetes: <7.0   08/19/2022 08:19 AM 4.5 (L) 4.6 - 6.5 % Final    Comment:    Glycemic Control Guidelines for People with Diabetes:Non Diabetic:  <6%Goal of Therapy: <7%Additional Action Suggested:  >8%     CBG: Recent Labs  Lab 06/29/23 1537 06/29/23 1959 06/29/23 2348 06/30/23 0353 06/30/23 0720  GLUCAP 214* 149* 174* 150* 137*     Critical care time: 40 minutes    Duaine German, MD [facility] Office: [phone]   See Amion for personal pager PCCM on call pager [phone] until 7pm. Please call Elink 7p-7a. [phone]

## 2023-06-30 NOTE — Progress Notes (Signed)
 Spoke with pharmacist regarding TPN discontinue/PICC order. Was instructed to leave TPN infusing at this time until tube feed rates are met. Rn made aware.

## 2023-07-01 ENCOUNTER — Inpatient Hospital Stay (HOSPITAL_COMMUNITY)

## 2023-07-01 DIAGNOSIS — K56609 Unspecified intestinal obstruction, unspecified as to partial versus complete obstruction: Secondary | ICD-10-CM | POA: Diagnosis not present

## 2023-07-01 DIAGNOSIS — B9562 Methicillin resistant Staphylococcus aureus infection as the cause of diseases classified elsewhere: Secondary | ICD-10-CM

## 2023-07-01 DIAGNOSIS — E8721 Acute metabolic acidosis: Secondary | ICD-10-CM | POA: Diagnosis not present

## 2023-07-01 DIAGNOSIS — Z9889 Other specified postprocedural states: Secondary | ICD-10-CM | POA: Diagnosis not present

## 2023-07-01 DIAGNOSIS — R579 Shock, unspecified: Secondary | ICD-10-CM | POA: Diagnosis not present

## 2023-07-01 DIAGNOSIS — T80219D Unspecified infection due to central venous catheter, subsequent encounter: Secondary | ICD-10-CM

## 2023-07-01 LAB — CULTURE, BLOOD (ROUTINE X 2)

## 2023-07-01 LAB — MAGNESIUM: Magnesium: 2.7 mg/dL — ABNORMAL HIGH (ref 1.7–2.4)

## 2023-07-01 LAB — GLUCOSE, CAPILLARY
Glucose-Capillary: 113 mg/dL — ABNORMAL HIGH (ref 70–99)
Glucose-Capillary: 106 mg/dL — ABNORMAL HIGH (ref 70–99)
Glucose-Capillary: 106 mg/dL — ABNORMAL HIGH (ref 70–99)
Glucose-Capillary: 116 mg/dL — ABNORMAL HIGH (ref 70–99)
Glucose-Capillary: 153 mg/dL — ABNORMAL HIGH (ref 70–99)

## 2023-07-01 LAB — CBC
HCT: 25 % — ABNORMAL LOW (ref 39.0–52.0)
Hemoglobin: 8.3 g/dL — ABNORMAL LOW (ref 13.0–17.0)
MCH: 28.5 pg (ref 26.0–34.0)
MCHC: 33.2 g/dL (ref 30.0–36.0)
MCV: 85.9 fL (ref 80.0–100.0)
Platelets: 112 10*3/uL — ABNORMAL LOW (ref 150–400)
RBC: 2.91 MIL/uL — ABNORMAL LOW (ref 4.22–5.81)
RDW: 18.2 % — ABNORMAL HIGH (ref 11.5–15.5)
WBC: 10.3 10*3/uL (ref 4.0–10.5)
nRBC: 0 % (ref 0.0–0.2)

## 2023-07-01 MED ORDER — OXYCODONE HCL 5 MG PO TABS
7.5000 mg | ORAL_TABLET | Freq: Four times a day (QID) | ORAL | Status: DC
  Administered 2023-07-01 – 2023-07-03 (×9): 7.5 mg
  Filled 2023-07-01 (×9): qty 2

## 2023-07-01 MED ORDER — ORAL CARE MOUTH RINSE: 15.0000 mL | OROMUCOSAL | Status: DC | PRN

## 2023-07-01 MED ORDER — VITAL 1.5 CAL PO LIQD
1000.0000 mL | ORAL | Status: DC
  Administered 2023-07-01 – 2023-07-02 (×2): 1000 mL

## 2023-07-01 MED ORDER — ORAL CARE MOUTH RINSE
15.0000 mL | OROMUCOSAL | Status: DC
  Administered 2023-07-01 – 2023-07-03 (×9): 15 mL via OROMUCOSAL

## 2023-07-01 MED ORDER — MELATONIN 3 MG PO TABS
3.0000 mg | ORAL_TABLET | Freq: Every day | ORAL | Status: DC
  Administered 2023-07-01 – 2023-07-02 (×2): 3 mg
  Filled 2023-07-01 (×2): qty 1

## 2023-07-01 MED ORDER — FENTANYL CITRATE PF 50 MCG/ML IJ SOSY
25.0000 ug | PREFILLED_SYRINGE | INTRAMUSCULAR | Status: DC | PRN
Start: 2023-07-01 — End: 2023-07-03
  Administered 2023-07-01 (×2): 50 ug via INTRAVENOUS
  Administered 2023-07-01: 25 ug via INTRAVENOUS
  Administered 2023-07-02 – 2023-07-03 (×3): 50 ug via INTRAVENOUS
  Filled 2023-07-01 (×6): qty 1

## 2023-07-01 MED ORDER — HEPARIN SODIUM (PORCINE) 5000 UNIT/ML IJ SOLN
5000.0000 [IU] | Freq: Three times a day (TID) | INTRAMUSCULAR | Status: DC
  Administered 2023-07-01 – 2023-07-03 (×6): 5000 [IU] via SUBCUTANEOUS
  Filled 2023-07-01 (×6): qty 1

## 2023-07-01 NOTE — Progress Notes (Signed)
 NAME:  Erik Tetzloff., MRN:  [id number], DOB:  03/30/1940, LOS: 11 ADMISSION DATE:  06/19/2023, CONSULTATION DATE:  07/01/23 REFERRING MD:  Verdene, CHIEF COMPLAINT:   hypotension  History of Present Illness:  83 y.o. M with PMH significant for CAD, HFrEF, collagen vascular disease, COPD, HL, HTN, ischemic cardiomyopathy who presented to the ED on 4/7 with abdominal pain.  His initial CT showed early SBO with stenosis of the celiac and SMA.  He was seen by vascular and underwent an SMA stent for acute mesenteric ischemia on 4/7, he continued to have abdominal pain and found to have an endoleak, this was discussed with general surgery and he was tken to the OR for an ex lap which revealed a necrotic R colon. R colectomy was done with ostomy formation.  Vascular surgery also placed a L renal artery and aortic artery stent.  He required pressors and was left intubated so PCCM consulted for admission  Pertinent  Medical History   has a past medical history of Adenomatous colon polyp, Aortic aneurysm (HCC), BPH (benign prostatic hypertrophy), CAD (coronary artery disease), Carotid artery stenosis, Cataract, CHF (congestive heart failure) (HCC), Chronic systolic heart failure (HCC), Collagen vascular disease (HCC), COPD (chronic obstructive pulmonary disease) (HCC), Coronary artery disease, Diverticulosis, GERD (gastroesophageal reflux disease), Heart murmur, History of colonic polyps, Hyperlipidemia, Hyperplastic colon polyp, Hypertension, Ischemic cardiomyopathy, and RLS (restless legs syndrome).   Significant Hospital Events: Including procedures, antibiotic start and stop dates in addition to other pertinent events   4/7 admit, SMA stent  4/15 worsening pain, endoleak, back to OR for ex lap, ostomy placement, blood cultures positive for MRSA and E. coli 4/16 remained intubated, on levophed , vasopressin  weaned off, CVL attempted right internal jugular but tracked into right subclavian vein and  removed. 4/17 TPN stopped in evening, PICC line removed  Interim History / Subjective:   No acute events overnight Case discussed with vascular surgery, high concern for graft infection based on blood cultures. High risk for mortality given that information.  Tolerated PSV trial this morning On/off pressors yesterday Tolerated trickle tube feeds yesterday  Objective   Blood pressure (!) 127/53, pulse 63, temperature 98.6 F (37 C), resp. rate (!) 26, height 5' 6 (1.676 m), weight 56.3 kg, SpO2 99%.    Vent Mode: PRVC FiO2 (%):  [40 %] 40 % Set Rate:  [26 bmp] 26 bmp Vt Set:  [510 mL] 510 mL PEEP:  [5 cmH20] 5 cmH20 Pressure Support:  [5 cmH20-10 cmH20] 5 cmH20 Plateau Pressure:  [20 cmH20-22 cmH20] 20 cmH20   Intake/Output Summary (Last 24 hours) at 07/01/2023 0755 Last data filed at 07/01/2023 0700 Gross per 24 hour  Intake 1849.63 ml  Output 2750 ml  Net -900.37 ml   Filed Weights   06/29/23 0500 06/30/23 0500 07/01/23 0500  Weight: 57 kg 58 kg 56.3 kg    General:  chronically ill elderly male, intubated awake HEENT: ETT in place, sclera anicteric Neuro: PERRL, awakens - follows commands CV: rrr, no murmurs PULM:  diminished breath sounds GI: ostomy and rectal tube and cortrak and NGT and JP drain, midline incision dressed Extremities: warm/dry, trace edema  Skin: dry  Labs reviewed  Resolved Hospital Problem list   N/A  Assessment & Plan:  Ischemic colitis s/p right colectomy Shock state: combination septic/distributive, volume shifts, ABLA MRSA and E. Coli Bacteremia Aspiration pneumonitis, increased WOB- in part due to acidemia as well Postoperative vent management Acute kidney injury Mixed Metabolic/respiratory Acidosis Hypernatremia  Severe protein calorie malnutrition PVD Anemia Thrombocytopenia CAD with prior PCI Prior AAA repair - concern for graft infection  - TPN stopped - tube feeds to goal today - PICC line removed yesterday for line  holiday - Currently off vasopressor support - will add midodrine if needed - oxycodone  7.5mg  q6hrs per tube and PRN fentanyl  for pain - Extubated this morning - Usual transfusion thresholds - Zosyn  + daptomycin  for bacteremia/intraabdominal coverage - PAD/VAP bundle - Guarded prognosis per vascular surgery with concern for graft infection from bacteremia. Consult palliative care for care planning,   Best Practice (right click and Reselect all SmartList Selections daily)   Diet/type: tubefeeds DVT prophylaxis not indicated GI prophylaxis: PPI Lines: N/A Foley:  Yes, and it is still needed Code Status:  DNR Last date of multidisciplinary goals of care discussion Patient is now DNR with continuing current aggressive measures.  Labs   CBC: Recent Labs  Lab 06/28/23 0554 06/28/23 1530 06/28/23 1911 06/28/23 1942 06/29/23 0557 06/29/23 0906 06/30/23 0412 06/30/23 1117 07/01/23 0448  WBC 11.8*  --  9.6  --  7.1  --  12.3*  --  10.3  HGB 8.4*   < > 10.0*   < > 8.0* 8.2* 8.1* 8.2* 8.3*  HCT 25.5*   < > 30.9*   < > 23.8* 24.0* 24.1* 24.0* 25.0*  MCV 86.1  --  87.8  --  85.6  --  85.2  --  85.9  PLT 79*  --  88*  84*  --  64*  --  87*  --  112*   < > = values in this interval not displayed.    Basic Metabolic Panel: Recent Labs  Lab 06/25/23 1640 06/26/23 0428 06/27/23 0515 06/27/23 1815 06/28/23 0554 06/28/23 1530 06/28/23 1911 06/28/23 1942 06/29/23 0557 06/29/23 0906 06/29/23 1638 06/30/23 0412 06/30/23 1117 06/30/23 1657 07/01/23 0448  NA 152*   < > 155*  --  150*   < > 150* 153* 146* 150*  --  149* 150*  --   --   K 3.0*   < > 4.2  --  4.4   < > 4.9 4.8 3.6 3.5  --  3.1* 3.5  --   --   CL 122*   < > 127*  --  125*  --  125*  --  118*  --   --  114*  --   --   --   CO2 25   < > 19*  --  17*  --  20*  --  21*  --   --  22  --   --   --   GLUCOSE 138*   < > 168*  --  176*  --  166*  --  179*  --   --  142*  --   --   --   BUN 72*   < > 55*  --  58*  --  62*   --  61*  --   --  65*  --   --   --   CREATININE 1.28*   < > 1.26*  --  1.37*  --  1.44*  --  1.39*  --   --  1.58*  --   --   --   CALCIUM  7.9*   < > 7.7*  --  7.8*  --  6.9*  --  6.6*  --   --  7.0*  --   --   --  MG 2.0   < > 2.5*   < > 2.3  --  2.2  --  2.1  --  2.3 2.4  --  2.5* 2.7*  PHOS 3.6  --  2.5  --  2.8  --   --   --  3.9  --   --  4.0  --   --   --    < > = values in this interval not displayed.   GFR: Estimated Creatinine Clearance: 28.7 mL/min (A) (by C-G formula based on SCr of 1.58 mg/dL (H)). Recent Labs  Lab 06/28/23 0916 06/28/23 1911 06/28/23 2214 06/29/23 0557 06/30/23 0412 07/01/23 0448  PROCALCITON 7.28  --   --   --   --   --   WBC  --  9.6  --  7.1 12.3* 10.3  LATICACIDVEN 1.4 1.0 1.3  --   --   --     Liver Function Tests: Recent Labs  Lab 06/25/23 0430 06/25/23 1640 06/27/23 0515 06/30/23 0412  AST 107*  --  43* 24  ALT 205*  --  80* 27  ALKPHOS 35*  --  41 50  BILITOT 0.7  --  1.1 1.0  PROT 5.1*  --  4.8* 5.1*  ALBUMIN  1.7* 1.5* <1.5* 2.0*   No results for input(s): LIPASE, AMYLASE in the last 168 hours. No results for input(s): AMMONIA in the last 168 hours.  ABG    Component Value Date/Time   PHART 7.398 06/30/2023 1117   PCO2ART 36.7 06/30/2023 1117   PO2ART 123 (H) 06/30/2023 1117   HCO3 22.6 06/30/2023 1117   TCO2 24 06/30/2023 1117   ACIDBASEDEF 2.0 06/30/2023 1117   O2SAT 99 06/30/2023 1117     Coagulation Profile: Recent Labs  Lab 06/28/23 1911  INR 1.4*    Cardiac Enzymes: Recent Labs  Lab 06/30/23 0412  CKTOTAL 176    HbA1C: Hgb A1c MFr Bld  Date/Time Value Ref Range Status  06/23/2023 10:55 AM 5.6 4.8 - 5.6 % Final    Comment:    (NOTE)         Prediabetes: 5.7 - 6.4         Diabetes: >6.4         Glycemic control for adults with diabetes: <7.0   08/19/2022 08:19 AM 4.5 (L) 4.6 - 6.5 % Final    Comment:    Glycemic Control Guidelines for People with Diabetes:Non Diabetic:  <6%Goal of Therapy:  <7%Additional Action Suggested:  >8%     CBG: Recent Labs  Lab 06/30/23 1526 06/30/23 2004 06/30/23 2348 07/01/23 0359 07/01/23 0733  GLUCAP 145* 93 108* 113* 106*     Critical care time: 35 minutes    Dorn Chill, MD [facility] Office: [phone]   See Amion for personal pager PCCM on call pager [phone] until 7pm. Please call Elink 7p-7a. [phone]

## 2023-07-01 NOTE — Progress Notes (Signed)
 Cortrak Tube Team Note:  Consult received to retract Cortrak feeding tube.   No stylet found in room. Cortrak tube retracted to 80cm.   X-ray is required. RN may begin using tube once tube confirmed in placed.   If the tube becomes dislodged please keep the tube and contact the Cortrak team at [email or web address] for replacement.  If after hours and replacement cannot be delayed, place a NG tube and confirm placement with an abdominal x-ray.    Torrance Freestone MS, RD, LDN If unable to be reached, please send secure chat to "RD inpatient" available from 8:00a-4:00p daily

## 2023-07-01 NOTE — Anesthesia Postprocedure Evaluation (Signed)
 Anesthesia Post Note  Patient: Erik [name].  Procedure(s) Performed: AORTOGRAM INSERTION OF GORE VIABAHN VBX 6mm X 19mm STENT AND 28.42mm X 4.5cm GORE EXLUDER STENT (Left) LAPAROSCOPY, DIAGNOSTIC (Abdomen) LAPAROTOMY, EXPLORATORY (Abdomen) COLECTOMY, WITH ILEOSTOMY CREATION (Abdomen)     Patient location during evaluation: ICU Anesthesia Type: General Level of consciousness: sedated Pain management: pain level controlled Vital Signs Assessment: post-procedure vital signs reviewed and stable Respiratory status: patient remains intubated per anesthesia plan Cardiovascular status: stable Postop Assessment: no apparent nausea or vomiting Anesthetic complications: no   No notable events documented.                 Hilja Kintzel

## 2023-07-01 NOTE — Progress Notes (Addendum)
  Progress Note    07/01/2023 8:37 AM 3 Days Post-Op  Subjective: Intubated; alert and following commands   Vitals:   07/01/23 0700 07/01/23 0800  BP: (!) 127/53 133/61  Pulse: 63 61  Resp: (!) 26 (!) 26  Temp: 98.6 F (37 C) 98.4 F (36.9 C)  SpO2: 99% 100%   Physical Exam: Lungs: Mechanical ventilation Incisions: Groin incisions well-healed Extremities: Feet are warm to touch Abdomen: Distended and somewhat tender Neurologic: Awake, alert, following commands  CBC    Component Value Date/Time   WBC 10.3 07/01/2023 0448   RBC 2.91 (L) 07/01/2023 0448   HGB 8.3 (L) 07/01/2023 0448   HGB 10.9 (L) 05/25/2023 1032   HCT 25.0 (L) 07/01/2023 0448   PLT 112 (L) 07/01/2023 0448   PLT 303 05/25/2023 1032   MCV 85.9 07/01/2023 0448   MCH 28.5 07/01/2023 0448   MCHC 33.2 07/01/2023 0448   RDW 18.2 (H) 07/01/2023 0448   LYMPHSABS 0.3 (L) 06/20/2023 0726   MONOABS 1.3 (H) 06/20/2023 0726   EOSABS 0.0 06/20/2023 0726   BASOSABS 0.1 06/20/2023 0726    BMET    Component Value Date/Time   NA 150 (H) 06/30/2023 1117   NA 138 08/18/2018 1135   K 3.5 06/30/2023 1117   CL 114 (H) 06/30/2023 0412   CO2 22 06/30/2023 0412   GLUCOSE 142 (H) 06/30/2023 0412   BUN 65 (H) 06/30/2023 0412   BUN 19 08/18/2018 1135   CREATININE 1.58 (H) 06/30/2023 0412   CREATININE 1.11 10/09/2021 1646   CALCIUM  7.0 (L) 06/30/2023 0412   GFRNONAA 43 (L) 06/30/2023 0412   GFRAA 95 08/18/2018 1135    INR    Component Value Date/Time   INR 1.4 (H) 06/28/2023 1911     Intake/Output Summary (Last 24 hours) at 07/01/2023 0837 Last data filed at 07/01/2023 0700 Gross per 24 hour  Intake 1744.54 ml  Output 2750 ml  Net -1005.46 ml     Assessment/Plan:  83 y.o. male is s/p  aortogram with left renal stent and aortic cuff  3 Days Post-Op   Patient is awake and alert this morning; he remains intubated.  Bilateral lower extremities are warm and well-perfused.  Groin incisions are  well-appearing.  Continue IV antibiotics per ID given staph, E. coli bacteremia with likely endograft infection.  Given a persistent endoleak and likely endograft infection,  recommend involving palliative care to discuss hospice services.   Cordie Deters, PA-C [facility] [phone] 07/01/2023 8:37 AM  VASCULAR STAFF ADDENDUM: I have independently interviewed and examined the patient. I agree with the above.  Extubated on exam-able to follow commands Thirsty Updated both he and his family that I think that the endograft infected. Will continue to follow clinically.  Recommend tailoring antibiotics to the E. coli and Staph aureus appreciated on blood culture. Should the patient continue to improve, we will discuss another CT scan next week.  At this time, we will continue to follow clinically.  Should he decompensate, vascular surgery will not offer further interventions as open explant would result in mortality.  Kayla Part MD [facility] Phone Number: [phone] 07/01/2023 12:29 PM

## 2023-07-01 NOTE — Progress Notes (Signed)
 3 Days Post-Op  Subjective: CC: Awake on vent. Having diffuse abdominal pain that is stable from yesterday. Off pressors. Having ostomy output. Scant NGT output. Good uop.   Tmax 100. HR 70's. Art line SBP okay. WBC 12.3.   Objective: Vital signs in last 24 hours: Temp:  [98.4 F (36.9 C)-99.9 F (37.7 C)] 98.6 F (37 C) (04/18 0700) Pulse Rate:  [60-87] 63 (04/18 0700) Resp:  [12-26] 26 (04/18 0700) BP: (100-152)/(51-70) 127/53 (04/18 0700) SpO2:  [97 %-100 %] 99 % (04/18 0700) Arterial Line BP: (127-170)/(34-47) 164/41 (04/18 0700) FiO2 (%):  [40 %] 40 % (04/18 0308) Weight:  [56.3 kg] 56.3 kg (04/18 0500) Last BM Date : 06/30/23  Intake/Output from previous day: 04/17 0701 - 04/18 0700 In: 1849.6 [I.V.:1267.8; NG/GT:391; IV Piggyback:190.9] Out: 2750 [Urine:1700; Stool:1050] Intake/Output this shift: No intake/output data recorded.  Vent Mode: PRVC FiO2 (%):  [40 %] 40 % Set Rate:  [26 bmp] 26 bmp Vt Set:  [510 mL] 510 mL PEEP:  [5 cmH20] 5 cmH20 Pressure Support:  [5 cmH20-10 cmH20] 5 cmH20 Plateau Pressure:  [20 cmH20-22 cmH20] 20 cmH20  PE: Gen:  Awake on vent Abd: Soft, minimal distension, generalized ttp, . Stoma viable. Ileostomy bag w/ liquid stool in pouch.  GU: Foley w/ clear yellow urine.  Ext:  No LE edema or calf tenderness Lab Results:  Recent Labs    06/30/23 0412 06/30/23 1117 07/01/23 0448  WBC 12.3*  --  10.3  HGB 8.1* 8.2* 8.3*  HCT 24.1* 24.0* 25.0*  PLT 87*  --  112*   BMET Recent Labs    06/29/23 0557 06/29/23 0906 06/30/23 0412 06/30/23 1117  NA 146*   < > 149* 150*  K 3.6   < > 3.1* 3.5  CL 118*  --  114*  --   CO2 21*  --  22  --   GLUCOSE 179*  --  142*  --   BUN 61*  --  65*  --   CREATININE 1.39*  --  1.58*  --   CALCIUM  6.6*  --  7.0*  --    < > = values in this interval not displayed.   PT/INR Recent Labs    06/28/23 1911  LABPROT 17.4*  INR 1.4*   CMP     Component Value Date/Time   NA 150 (H)  06/30/2023 1117   NA 138 08/18/2018 1135   K 3.5 06/30/2023 1117   CL 114 (H) 06/30/2023 0412   CO2 22 06/30/2023 0412   GLUCOSE 142 (H) 06/30/2023 0412   BUN 65 (H) 06/30/2023 0412   BUN 19 08/18/2018 1135   CREATININE 1.58 (H) 06/30/2023 0412   CREATININE 1.11 10/09/2021 1646   CALCIUM  7.0 (L) 06/30/2023 0412   PROT 5.1 (L) 06/30/2023 0412   ALBUMIN  2.0 (L) 06/30/2023 0412   AST 24 06/30/2023 0412   ALT 27 06/30/2023 0412   ALKPHOS 50 06/30/2023 0412   BILITOT 1.0 06/30/2023 0412   GFRNONAA 43 (L) 06/30/2023 0412   GFRAA 95 08/18/2018 1135   Lipase     Component Value Date/Time   LIPASE 61 (H) 03/09/2022 0812    Studies/Results: DG CHEST PORT 1 VIEW Result Date: 06/29/2023 CLINICAL DATA:  Respiratory failure, central venous catheter placement EXAM: PORTABLE CHEST 1 VIEW COMPARISON:  None Available. FINDINGS: Right subclavian central venous catheter has been placed with its tip extending superiorly into the expected jugular vein beyond the superior margin of the  examination. Endotracheal tube, nasoenteric feeding tube, and right upper extremity PICC line are unchanged. Pulmonary insufflation is stable. Superimposed asymmetric perihilar and upper lung zone airspace infiltrates appear slightly progressive since prior examination, in keeping with infection or noncardiogenic pulmonary edema. No pneumothorax or pleural effusion. Cardiac size is within normal limits. Abdominal aortic stent graft is partially visualized. IMPRESSION: 1. Interval placement of right subclavian central venous catheter with its tip extending superiorly into the expected jugular vein beyond the superior margin of the examination. Repositioning is recommended. 2. Stable support lines and tubes. 3. Progressive asymmetric perihilar and upper lung zone airspace infiltrates, in keeping with infection or noncardiogenic pulmonary edema. These results will be called to the ordering clinician or representative by the  Radiologist Assistant, and communication documented in the PACS or [employer]. Electronically Signed   By: Worthy Heads M.D.   On: 06/29/2023 20:22   ECHOCARDIOGRAM COMPLETE Result Date: 06/29/2023    ECHOCARDIOGRAM REPORT   Patient Name:   Erik [name]. Date of Exam: 06/29/2023 Medical Rec #:  [id number]             Height:       66.0 in Accession #:    [id number]            Weight:       125.7 lb Date of Birth:  04/14/1940              BSA:          1.641 m Patient Age:    83 years              BP:           145/55 mmHg Patient Gender: M                     HR:           60 bpm. Exam Location:  Inpatient Procedure: 2D Echo, Cardiac Doppler and Color Doppler (Both Spectral and Color            Flow Doppler were utilized during procedure). Indications:    Endocarditis I38  History:        Patient has prior history of Echocardiogram examinations, most                 recent 06/24/2023. Cardiomyopathy, Previous Myocardial Infarction                 and CAD, COPD, Endocarditis, Signs/Symptoms:Murmur; Risk                 Factors:Hypertension and Dyslipidemia.  Sonographer:    Terrilee Few RCS Referring Phys: ADITYA PALIWAL IMPRESSIONS  1. Left ventricular ejection fraction, by estimation, is 50 to 55%. The left ventricle has low normal function. The left ventricle has no regional wall motion abnormalities. Left ventricular diastolic parameters are consistent with Grade II diastolic dysfunction (pseudonormalization).  2. Right ventricular systolic function is normal. The right ventricular size is normal.  3. Left atrial size was moderately dilated.  4. RA mass seen in images likely represents prominent eustachian valve. Right atrial size was mildly dilated.  5. Restricted posterior leaflet with at least moderate MR. Appears worsened since last echo. The mitral valve is degenerative. Moderate mitral valve regurgitation. Mild mitral stenosis. The mean mitral valve gradient is 6.0 mmHg. Severe mitral  annular calcification.  6. Tricuspid valve regurgitation is mild to moderate.  7. The aortic valve is calcified. Aortic valve regurgitation  is severe. No aortic stenosis is present.  8. The inferior vena cava is normal in size with greater than 50% respiratory variability, suggesting right atrial pressure of 3 mmHg. FINDINGS  Left Ventricle: Left ventricular ejection fraction, by estimation, is 50 to 55%. The left ventricle has low normal function. The left ventricle has no regional wall motion abnormalities. The left ventricular internal cavity size was normal in size. There is no left ventricular hypertrophy. Left ventricular diastolic parameters are consistent with Grade II diastolic dysfunction (pseudonormalization). Right Ventricle: The right ventricular size is normal. No increase in right ventricular wall thickness. Right ventricular systolic function is normal. Left Atrium: Left atrial size was moderately dilated. Right Atrium: RA mass seen in images likely represents prominent eustachian valve. Right atrial size was mildly dilated. Prominent Eustachian valve. Pericardium: There is no evidence of pericardial effusion. Mitral Valve: Restricted posterior leaflet with at least moderate MR. Appears worsened since last echo. The mitral valve is degenerative in appearance. Severe mitral annular calcification. Moderate mitral valve regurgitation. Mild mitral valve stenosis. MV peak gradient, 13.8 mmHg. The mean mitral valve gradient is 6.0 mmHg. There is no evidence of mitral valve vegetation. Tricuspid Valve: The tricuspid valve is normal in structure. Tricuspid valve regurgitation is mild to moderate. No evidence of tricuspid stenosis. There is no evidence of tricuspid valve vegetation. Aortic Valve: The aortic valve is calcified. Aortic valve regurgitation is severe. No aortic stenosis is present. Aortic valve mean gradient measures 9.5 mmHg. Aortic valve peak gradient measures 18.5 mmHg. Aortic valve area, by  VTI measures 1.30 cm. Pulmonic Valve: The pulmonic valve was normal in structure. Pulmonic valve regurgitation is not visualized. No evidence of pulmonic stenosis. Aorta: The aortic root is normal in size and structure. Venous: The inferior vena cava is normal in size with greater than 50% respiratory variability, suggesting right atrial pressure of 3 mmHg. IAS/Shunts: No atrial level shunt detected by color flow Doppler.  LEFT VENTRICLE PLAX 2D LVIDd:         4.70 cm   Diastology LVIDs:         3.40 cm   LV e' medial:    5.87 cm/s LV PW:         1.00 cm   LV E/e' medial:  27.8 LV IVS:        1.20 cm   LV e' lateral:   9.36 cm/s LVOT diam:     1.80 cm   LV E/e' lateral: 17.4 LV SV:         66 LV SV Index:   40 LVOT Area:     2.54 cm  RIGHT VENTRICLE             IVC RV S prime:     15.10 cm/s  IVC diam: 2.80 cm TAPSE (M-mode): 2.6 cm LEFT ATRIUM             Index        RIGHT ATRIUM           Index LA diam:        4.60 cm 2.80 cm/m   RA Area:     15.10 cm LA Vol (A2C):   93.7 ml 57.09 ml/m  RA Volume:   36.30 ml  22.12 ml/m LA Vol (A4C):   84.7 ml 51.60 ml/m LA Biplane Vol: 89.4 ml 54.47 ml/m  AORTIC VALVE AV Area (Vmax):    1.37 cm AV Area (Vmean):   1.40 cm AV Area (VTI):  1.30 cm AV Vmax:           215.00 cm/s AV Vmean:          139.500 cm/s AV VTI:            0.506 m AV Peak Grad:      18.5 mmHg AV Mean Grad:      9.5 mmHg LVOT Vmax:         115.67 cm/s LVOT Vmean:        76.733 cm/s LVOT VTI:          0.259 m LVOT/AV VTI ratio: 0.51  AORTA Ao Root diam: 3.00 cm Ao Asc diam:  3.20 cm MITRAL VALVE MV Area (PHT): 2.30 cm     SHUNTS MV Area VTI:   1.00 cm     Systemic VTI:  0.26 m MV Peak grad:  13.8 mmHg    Systemic Diam: 1.80 cm MV Mean grad:  6.0 mmHg MV Vmax:       1.86 m/s MV Vmean:      116.5 cm/s MV Decel Time: 330 msec MV E velocity: 163.00 cm/s MV A velocity: 161.00 cm/s MV E/A ratio:  1.01 Arta Lark Electronically signed by Arta Lark Signature Date/Time: 06/29/2023/4:22:17 PM     Final     Anti-infectives: Anti-infectives (From admission, onward)    Start     Dose/Rate Route Frequency Ordered Stop   06/30/23 0800  DAPTOmycin  (CUBICIN ) IVPB 500 mg/64mL premix        8 mg/kg  57 kg 100 mL/hr over 30 Minutes Intravenous Daily 06/29/23 1146     06/30/23 0300  vancomycin  (VANCOREADY) IVPB 500 mg/100 mL  Status:  Discontinued        500 mg 100 mL/hr over 60 Minutes Intravenous Every 24 hours 06/29/23 0054 06/29/23 1146   06/29/23 0200  vancomycin  (VANCOCIN ) 1,250 mg in sodium chloride  0.9 % 250 mL IVPB        1,250 mg 175 mL/hr over 90 Minutes Intravenous  Once 06/29/23 0054 06/29/23 0336   06/28/23 0930  piperacillin -tazobactam (ZOSYN ) IVPB 3.375 g  Status:  Discontinued        3.375 g 12.5 mL/hr over 240 Minutes Intravenous Every 12 hours 06/28/23 0832 06/28/23 0834   06/28/23 0930  piperacillin -tazobactam (ZOSYN ) IVPB 3.375 g        3.375 g 12.5 mL/hr over 240 Minutes Intravenous Every 8 hours 06/28/23 0834     06/24/23 2200  piperacillin -tazobactam (ZOSYN ) IVPB 3.375 g        3.375 g 12.5 mL/hr over 240 Minutes Intravenous Every 12 hours 06/24/23 1013 06/27/23 1621   06/20/23 1000  piperacillin -tazobactam (ZOSYN ) IVPB 3.375 g  Status:  Discontinued        3.375 g 12.5 mL/hr over 240 Minutes Intravenous Every 8 hours 06/20/23 0903 06/24/23 1013        Assessment/Plan POD 10 s/p dx laparoscopy by Dr. 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  POD 2 S/p ex lap, R colectomy, end ileostomy by Dr. 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  - Okay to increase TF to goal - BID WTD - WOCN consult - Appreciate CCM assistance w/ her care.   FEN - IVF per primary, ok to increase  TF to goal VTE - SCDs, okay for ppx or therapeutic  anticoagulation from our standpoint, H/H stable and plt up to 112 from 87 . On ASA.  ID - Dapto/Zosyn  Foley - In place, good uop, per primary    LOS: 11 days    Edmon Gosling, MD [facility] 07/01/2023, 8:04 AM Please see Amion for pager number during day hours 7:00am-4:30pm

## 2023-07-01 NOTE — Procedures (Signed)
 Extubation Procedure Note  Patient Details:   Name: Erik [name]. DOB: May 30, 1940 MRN: [id number]   Airway Documentation:    Vent end date: 07/01/23 Vent end time: 0902   Evaluation  O2 sats: stable throughout Complications: No apparent complications Patient did tolerate procedure well. Bilateral Breath Sounds: Clear   Yes  Pt extubated per order to 4L [place]. Pt had positive cuff leak, able to state name and no stridor noted with a weak cough.   Shan LITTIE Collum 07/01/2023, 9:02 AM

## 2023-07-01 NOTE — Plan of Care (Signed)
  Problem: Clinical Measurements: Goal: Ability to maintain clinical measurements within normal limits will improve Outcome: Progressing Goal: Cardiovascular complication will be avoided Outcome: Progressing   Problem: Nutrition: Goal: Adequate nutrition will be maintained Outcome: Progressing   Problem: Elimination: Goal: Will not experience complications related to bowel motility Outcome: Progressing   Problem: Pain Managment: Goal: General experience of comfort will improve and/or be controlled Outcome: Progressing   Problem: Metabolic: Goal: Ability to maintain appropriate glucose levels will improve Outcome: Progressing   Problem: Nutritional: Goal: Maintenance of adequate nutrition will improve Outcome: Progressing   Problem: Tissue Perfusion: Goal: Adequacy of tissue perfusion will improve Outcome: Progressing

## 2023-07-01 NOTE — Progress Notes (Signed)
 Physical Therapy Treatment Patient Details Name: Erik [name]. MRN: [id number] DOB: 14-Nov-1940 Today's Date: 07/01/2023   History of Present Illness 83 y.o. 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 Pt transferred to [facility] where he underwent diagnostic laparoscopy 4/7 where he was found to have a mesenteric injury. Return to OR on 4/15 for ex-lap with R colectomy and ileostomy, along with L renal artery and aortic stent placement. PMH: CAD status post PCI, COPD, chronic HFrEF, AAA s/p endovascular repair.    PT Comments  Pt tolerates bed mobility well despite reports of fatigue pre-mobility. Pt demonstrates improvement in sitting balance, maintaining balance without physical assistance for brief periods with verbal cueing. Pt continues to require physical assistance for all functional mobility tasks due to significant generalized weakness. PT will continue to follow in an effort to improve strength, endurance and mobility quality.    If plan is discharge home, recommend the following: Two people to help with walking and/or transfers;Two people to help with bathing/dressing/bathroom;Assistance with cooking/housework;Assistance with feeding;Direct supervision/assist for medications management;Direct supervision/assist for financial management;Assist for transportation;Help with stairs or ramp for entrance   Can travel by private vehicle     No  Equipment Recommendations  BSC/3in1;Wheelchair (measurements PT);Hospital bed    Recommendations for Other Services       Precautions / Restrictions Precautions Precautions: Fall Recall of Precautions/Restrictions: Impaired Precaution/Restrictions Comments: A-line, NG tube, cortrak, flexiseal Restrictions Weight Bearing Restrictions Per Provider Order: No Other Position/Activity  Restrictions: monitor blood pressure, rectal tube     Mobility  Bed Mobility Overal bed mobility: Needs Assistance Bed Mobility: Supine to Sit, Sit to Supine     Supine to sit: Max assist, HOB elevated, +2 for physical assistance Sit to supine: Total assist, +2 for physical assistance        Transfers                        Ambulation/Gait                   Stairs             Wheelchair Mobility     Tilt Bed    Modified Rankin (Stroke Patients Only)       Balance Overall balance assessment: Needs assistance Sitting-balance support: Single extremity supported, Bilateral upper extremity supported, Feet supported Sitting balance-Leahy Scale: Poor Sitting balance - Comments: modA progressing to CGA with cues to maintain balance Postural control: Posterior lean                                  Communication Communication Communication: Impaired Factors Affecting Communication: Hearing impaired  Cognition Arousal: Alert Behavior During Therapy: Flat affect   PT - Cognitive impairments: Orientation, Memory, Problem solving, Safety/Judgement                         Following commands: Intact Following commands impaired: Follows one step commands with increased time    Cueing Cueing Techniques: Verbal cues, Tactile cues, Visual cues  Exercises      General Comments General comments (skin integrity, edema, etc.): VSS on RA, BP stable on a-line with mobility      Pertinent  Vitals/Pain Pain Assessment Pain Assessment: Faces Faces Pain Scale: Hurts even more Pain Location: everywhere Pain Descriptors / Indicators: Grimacing Pain Intervention(s): Monitored during session    Home Living                          Prior Function            PT Goals (current goals can now be found in the care plan section) Acute Rehab PT Goals Patient Stated Goal: to improve strength Progress towards PT goals:  Progressing toward goals (slowly)    Frequency    Min 3X/week (may decrease to 2x if medical prognosis remains poor)      PT Plan      Co-evaluation              AM-PAC PT "6 Clicks" Mobility   Outcome Measure  Help needed turning from your back to your side while in a flat bed without using bedrails?: Total Help needed moving from lying on your back to sitting on the side of a flat bed without using bedrails?: Total Help needed moving to and from a bed to a chair (including a wheelchair)?: Total Help needed standing up from a chair using your arms (e.g., wheelchair or bedside chair)?: Total Help needed to walk in hospital room?: Total Help needed climbing 3-5 steps with a railing? : Total 6 Click Score: 6    End of Session Equipment Utilized During Treatment: Oxygen Activity Tolerance: Patient limited by fatigue Patient left: in bed;with call bell/phone within reach;with bed alarm set Nurse Communication: Mobility status;Need for lift equipment PT Visit Diagnosis: Unsteadiness on feet (R26.81);Other abnormalities of gait and mobility (R26.89);Muscle weakness (generalized) (M62.81);Difficulty in walking, not elsewhere classified (R26.2);Pain     Time: 1352-1411 PT Time Calculation (min) (ACUTE ONLY): 19 min  Charges:    $Therapeutic Activity: 8-22 mins                       Rexie Catena, PT, DPT Acute Rehabilitation Office [phone]    Rexie Catena 07/01/2023, 2:22 PM

## 2023-07-01 NOTE — Progress Notes (Signed)
 PHARMACY - TOTAL PARENTERAL NUTRITION CONSULT NOTE   Indication:  malnutrition, suspected ileus  Patient Measurements: Height: 5' 6 (167.6 cm) Weight: 56.3 kg (124 lb 1.9 oz) IBW/kg (Calculated) : 63.8 TPN AdjBW (KG): 50.8 Body mass index is 20.03 kg/m.  Assessment:  83yoM admitted 4/7 for complaints of acute on chronic abdominal pain (2 - 3 month history, worsened for 2 days prior to admission). Initial CT abdomen/pelvis showed concern for early SBO vs ileus as well as stenosis of celiac artery and SMA, mesenteric stent placed. Underwent diagnostic laparoscopy and mesenteric angiography on 4/7, no bowel ischemia appreciated. All of small bowel viable per surgery note.  Has had weight loss over past several years from 132 lbs to 112 lbs despite normal dietary intake, now has muscle/fat depletion with concern for severe malnutrition on RD exam. Has not received enteral nutrition since admission on 4/7, NG tube to suction. Per vascular surgery team, abdominal pain may be 2/2 edematous bowel so holding tube feeding. Pharmacy consulted for TPN 4/9.  Glucose / Insulin : last A1c 5.6% - BG < 130, Used 3 units sSSI / 24 hrs Electrolytes: Na 150 (0 in TPN), K 3.5 (received 80 mEq per tube, goal >/=4), Cl 114, CO2 22, iCa 1.08, Mg 2.7 up, others wnl   Renal: AKI - Scr up 1.58 (baseline <1), BUN up 65 (BL 22) Hepatic: AST/ALT wnl, Albumin  2, Tbili 1, TG 169 Intake / Output; MIVF: UOP 1.3 mL/kg/hr, ileostomy 1050 mL  (FMS 0 charted), Net  +9.1L   GI Imaging: 4/6 CT: multiple fluid filled mildly dilated loops of small bowel 4/7 KUB: decr gaseous distension of small bowel loops, retained stool in R colon 4/7 KUB SBO protocol: contrast within colonic lumen 4/15 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, thrombus extends to intima of distal abdominal aorta GI Surgeries / Procedures:  4/7 Diagnostic laparoscopy and mesenteric angiography 4/15 Aortogram with L-renal artery  stent placement, aortic stent placement  Central access: PICC placed 4/12, removed 4/17 18:30  TPN start date: 06/23/23, held 4/17   Nutritional Goals: Goal TPN rate is 70 mL/hr (provides 95g AA and 1800 kCal per day) - Pushing toward upper end of goals due to s/p surgery (RD request)  RD Estimated Needs Total Energy Estimated Needs: 1500-1800 kcal/d Total Protein Estimated Needs: 80-95g/d Total Fluid Estimated Needs: 1.8L/d  Current Nutrition:  TPN - off 4/17 pm for line holiday 4/17 Vital 1.5 at 20 ml/hr  Plan:  Continue to hold TPN  F/u TF advancement and watch ostomy output Continue sensitive SSI Q6H for now    Jinnie Door, PharmD, BCPS, BCCP Clinical Pharmacist  Please check AMION for all [facility] Pharmacy phone numbers After 10:00 PM, call Main Pharmacy [phone]

## 2023-07-01 NOTE — Progress Notes (Signed)
 Nutrition Follow-up  DOCUMENTATION CODES:  Underweight, Severe malnutrition in context of chronic illness  INTERVENTION:  - Tube feeding via postpyloric Cortrak: Vital 1.5 at 45 ml/h (1080 ml per day) Provides 1620 kcal, 73 gm protein, 825 ml free water  daily  - If unable to tolerate advancement of TF recommend re-initiating TPN.   NUTRITION DIAGNOSIS:  Severe Malnutrition related to chronic illness (CHF) as evidenced by severe muscle depletion, severe fat depletion. - remains applicable   GOAL:  Patient will meet greater than or equal to 90% of their needs - Progressing, TF advancing to goal  MONITOR:  I & O's, Vent status, Labs, Weight trends  REASON FOR ASSESSMENT:  Consult Enteral/tube feeding initiation and management  ASSESSMENT:  Pt with hx of CAD, HLD, HTN, CHF, and GERD presented to ED with abdominal pain that has been ongoing for months but significantly worse on the day of admission.  4/6 - presented to ED 4/7 - OR, s/p dx laparoscopy, stent placed in SMA by vascular surgery, left intubated after surgery 4/8 - extubated 4/9 - increased delirium and WOB, on BiPAP 4/10 - TPN initiated 4/11 - post-pyloric cortrak placed 4/13-4/15 - trickle feeds 4/15 - advanced to goal but then held for abdominal pain and instability, taken to OR, right colectomy with ileostomy with surgery team and aortogram with left renal stent and aortic cuff with vascular surgery  4/17 - Trickle TF; TPN stopped, PICC line removed 4/18 - Extubated; diet advanced to clear liquids - Nectar Thick  Met with pt in room at time of Cortrak tube adjustment. Cortrak track tube retracted back to allow more room for absorption s/p colectomy with ileostomy. Per x-ray, Cortrak in proximal duodenum. Pt reports he has some abdominal pain, denies any nausea or vomiting. Additional NGT remains in place connected to LIWS, minimal output in canister on wall.  Per MD notes, plan to advance TF to goal today. Concern  for endograft infected.   Admit weight: 50.8 kg  Current weight: 56.3 kg (4/18)   Medications reviewed and include: NovoLog  SSI, Protonix , IV antibiotics  Labs reviewed: Magnesium  2.7  CBG: 93-145 x 24 hrs  Diet Order:   Diet Order             Diet clear liquid Room service appropriate? Yes with Assist; Fluid consistency: Nectar Thick  Diet effective now                   EDUCATION NEEDS:  Not appropriate for education at this time  Skin:  Skin Assessment: Skin Integrity Issues: Skin Integrity Issues:: Incisions, DTI DTI: Nose Incisions: Abdomen  Last BM:  4/17 - via ileostomy  Height:  Ht Readings from Last 1 Encounters:  06/19/23 5\' 6"  (1.676 m)    Weight:  Wt Readings from Last 1 Encounters:  07/01/23 56.3 kg    Ideal Body Weight:  64.5 kg  BMI:  Body mass index is 20.03 kg/m.  Estimated Nutritional Needs:  Kcal:  1500-1800 kcal/d Protein:  80-95g/d Fluid:  1.8L/d   Doneta Furbish RD, LDN Clinical Dietitian

## 2023-07-01 NOTE — Progress Notes (Signed)
 Subjective:  Pt confused, but extubated   Antibiotics:  Anti-infectives (From admission, onward)    Start     Dose/Rate Route Frequency Ordered Stop   06/30/23 0800  DAPTOmycin  (CUBICIN ) IVPB 500 mg/61mL premix        8 mg/kg  57 kg 100 mL/hr over 30 Minutes Intravenous Daily 06/29/23 1146     06/30/23 0300  vancomycin  (VANCOREADY) IVPB 500 mg/100 mL  Status:  Discontinued        500 mg 100 mL/hr over 60 Minutes Intravenous Every 24 hours 06/29/23 0054 06/29/23 1146   06/29/23 0200  vancomycin  (VANCOCIN ) 1,250 mg in sodium chloride  0.9 % 250 mL IVPB        1,250 mg 175 mL/hr over 90 Minutes Intravenous  Once 06/29/23 0054 06/29/23 0336   06/28/23 0930  piperacillin -tazobactam (ZOSYN ) IVPB 3.375 g  Status:  Discontinued        3.375 g 12.5 mL/hr over 240 Minutes Intravenous Every 12 hours 06/28/23 0832 06/28/23 0834   06/28/23 0930  piperacillin -tazobactam (ZOSYN ) IVPB 3.375 g        3.375 g 12.5 mL/hr over 240 Minutes Intravenous Every 8 hours 06/28/23 0834     06/24/23 2200  piperacillin -tazobactam (ZOSYN ) IVPB 3.375 g        3.375 g 12.5 mL/hr over 240 Minutes Intravenous Every 12 hours 06/24/23 1013 06/27/23 1621   06/20/23 1000  piperacillin -tazobactam (ZOSYN ) IVPB 3.375 g  Status:  Discontinued        3.375 g 12.5 mL/hr over 240 Minutes Intravenous Every 8 hours 06/20/23 0903 06/24/23 1013       Medications: Scheduled Meds:  arformoterol   15 mcg Nebulization BID   aspirin   81 mg Per Tube Daily   budesonide  (PULMICORT ) nebulizer solution  0.25 mg Nebulization BID   Chlorhexidine  Gluconate Cloth  6 each Topical Daily   heparin  injection (subcutaneous)  5,000 Units Subcutaneous Q8H   insulin  aspart  0-9 Units Subcutaneous Q4H   mouth rinse  15 mL Mouth Rinse 4 times per day   oxyCODONE   7.5 mg Per Tube Q6H   pantoprazole  (PROTONIX ) IV  40 mg Intravenous Q24H   revefenacin   175 mcg Nebulization Daily   sodium chloride  flush  3 mL Intravenous Q12H    Continuous Infusions:  sodium chloride  10 mL/hr at 07/01/23 1300   DAPTOmycin  Stopped (06/30/23 0847)   feeding supplement (VITAL 1.5 CAL) 45 mL/hr at 07/01/23 1300   norepinephrine  (LEVOPHED ) Adult infusion Stopped (06/30/23 2139)   piperacillin -tazobactam (ZOSYN )  IV Stopped (07/01/23 1034)   PRN Meds:.fentaNYL  (SUBLIMAZE ) injection, Gerhardt's butt cream, labetalol , ondansetron  (ZOFRAN ) IV, mouth rinse, sodium chloride  flush    Objective: Weight change: -1.7 kg  Intake/Output Summary (Last 24 hours) at 07/01/2023 1322 Last data filed at 07/01/2023 1300 Gross per 24 hour  Intake 1574.65 ml  Output 2785 ml  Net -1210.35 ml   Blood pressure (!) 116/52, pulse 80, temperature 99.1 F (37.3 C), resp. rate (!) 27, height 5\' 6"  (1.676 m), weight 56.3 kg, SpO2 92%. Temp:  [98.4 F (36.9 C)-99.9 F (37.7 C)] 99.1 F (37.3 C) (04/18 1300) Pulse Rate:  [60-89] 80 (04/18 1300) Resp:  [12-30] 27 (04/18 1300) BP: (100-152)/(51-84) 116/52 (04/18 1300) SpO2:  [92 %-100 %] 92 % (04/18 1300) Arterial Line BP: (142-167)/(34-47) 151/39 (04/18 1300) FiO2 (%):  [40 %] 40 % (04/18 0826) Weight:  [56.3 kg] 56.3 kg (04/18 0500)  Physical Exam: Physical Exam Constitutional:  Appearance: He is ill-appearing.     Interventions: He is intubated.  Eyes:     General:        Right eye: No discharge.        Left eye: No discharge.     Extraocular Movements: Extraocular movements intact.  Cardiovascular:     Rate and Rhythm: Tachycardia present.  Pulmonary:     Effort: No respiratory distress. He is intubated.     Breath sounds: No wheezing.  Abdominal:     General: There is no distension.  Skin:    General: Skin is warm.  Neurological:     General: No focal deficit present.     Mental Status: He is disoriented.      CBC:    BMET Recent Labs    06/29/23 0557 06/29/23 0906 06/30/23 0412 06/30/23 1117  NA 146*   < > 149* 150*  K 3.6   < > 3.1* 3.5  CL 118*  --  114*  --    CO2 21*  --  22  --   GLUCOSE 179*  --  142*  --   BUN 61*  --  65*  --   CREATININE 1.39*  --  1.58*  --   CALCIUM  6.6*  --  7.0*  --    < > = values in this interval not displayed.     Liver Panel  Recent Labs    06/30/23 0412  PROT 5.1*  ALBUMIN  2.0*  AST 24  ALT 27  ALKPHOS 50  BILITOT 1.0       Sedimentation Rate No results for input(s): "ESRSEDRATE" in the last 72 hours. C-Reactive Protein No results for input(s): "CRP" in the last 72 hours.  Micro Results: Recent Results (from the past 720 hours)  MRSA Next Gen by PCR, Nasal     Status: Abnormal   Collection Time: 06/25/23  9:59 PM   Specimen: Nasal Mucosa; Nasal Swab  Result Value Ref Range Status   MRSA by PCR Next Gen DETECTED (A) NOT DETECTED Final    Comment: RESULT CALLED TO, READ BACK BY AND VERIFIED WITH: B WARNER,RN@0117  06/26/23 MK (NOTE) The GeneXpert MRSA Assay (FDA approved for NASAL specimens only), is one component of a comprehensive MRSA colonization surveillance program. It is not intended to diagnose MRSA infection nor to guide or monitor treatment for MRSA infections. Test performance is not FDA approved in patients less than 72 years old. Performed at [facility], [address], [place], [place] [address]   Culture, blood (Routine X 2) w Reflex to ID Panel     Status: Abnormal   Collection Time: 06/28/23  9:16 AM   Specimen: BLOOD  Result Value Ref Range Status   Specimen Description BLOOD SITE NOT SPECIFIED  Final   Special Requests   Final    BOTTLES DRAWN AEROBIC AND ANAEROBIC Blood Culture results may not be optimal due to an inadequate volume of blood received in culture bottles   Culture  Setup Time   Final    GRAM POSITIVE COCCI IN CLUSTERS IN BOTH AEROBIC AND ANAEROBIC BOTTLES CRITICAL RESULT CALLED TO, READ BACK BY AND VERIFIED WITH: J WYLAND,PHARMD@0046  06/29/23 MK    Culture (A)  Final    STAPHYLOCOCCUS AUREUS SUSCEPTIBILITIES PERFORMED ON PREVIOUS CULTURE  WITHIN THE LAST 5 DAYS. Performed at [facility], [address] [place], [place] [address]    Report Status 07/01/2023 FINAL  Final  Culture, blood (Routine X  2) w Reflex to ID Panel     Status: Abnormal   Collection Time: 06/28/23  9:16 AM   Specimen: BLOOD  Result Value Ref Range Status   Specimen Description BLOOD SITE NOT SPECIFIED  Final   Special Requests   Final    BOTTLES DRAWN AEROBIC AND ANAEROBIC Blood Culture results may not be optimal due to an inadequate volume of blood received in culture bottles   Culture  Setup Time   Final    GRAM POSITIVE COCCI IN CLUSTERS GRAM NEGATIVE RODS IN BOTH AEROBIC AND ANAEROBIC BOTTLES CRITICAL RESULT CALLED TO, READ BACK BY AND VERIFIED WITH: J WYLAND,PHARMD@0046  06/29/23 MK Performed at [facility], [address] [place], [place] [address]    Culture (A)  Final    ESCHERICHIA COLI METHICILLIN RESISTANT STAPHYLOCOCCUS AUREUS    Report Status 07/01/2023 FINAL  Final   Organism ID, Bacteria ESCHERICHIA COLI  Final   Organism ID, Bacteria ESCHERICHIA COLI  Final   Organism ID, Bacteria METHICILLIN RESISTANT STAPHYLOCOCCUS AUREUS  Final      Susceptibility   Escherichia coli - KIRBY BAUER*    CEFAZOLIN  INTERMEDIATE Intermediate    Escherichia coli - MIC*    AMPICILLIN 16 INTERMEDIATE Intermediate     CEFEPIME  <=0.12 SENSITIVE Sensitive     CEFTAZIDIME <=1 SENSITIVE Sensitive     CEFTRIAXONE  <=0.25 SENSITIVE Sensitive     CIPROFLOXACIN <=0.25 SENSITIVE Sensitive     GENTAMICIN <=1 SENSITIVE Sensitive     IMIPENEM 0.5 SENSITIVE Sensitive     TRIMETH/SULFA <=20 SENSITIVE Sensitive     AMPICILLIN/SULBACTAM 8 SENSITIVE Sensitive     PIP/TAZO <=4 SENSITIVE Sensitive ug/mL    * ESCHERICHIA COLI    ESCHERICHIA COLI   Methicillin resistant staphylococcus aureus - MIC*    CIPROFLOXACIN <=0.5 SENSITIVE Sensitive     ERYTHROMYCIN >=8 RESISTANT Resistant     GENTAMICIN <=0.5 SENSITIVE Sensitive     OXACILLIN >=4 RESISTANT  Resistant     TETRACYCLINE <=1 SENSITIVE Sensitive     VANCOMYCIN  <=0.5 SENSITIVE Sensitive     TRIMETH/SULFA <=10 SENSITIVE Sensitive     CLINDAMYCIN <=0.25 SENSITIVE Sensitive     RIFAMPIN <=0.5 SENSITIVE Sensitive     Inducible Clindamycin NEGATIVE Sensitive     LINEZOLID 2 SENSITIVE Sensitive     * METHICILLIN RESISTANT STAPHYLOCOCCUS AUREUS  Blood Culture ID Panel (Reflexed)     Status: Abnormal   Collection Time: 06/28/23  9:16 AM  Result Value Ref Range Status   Enterococcus faecalis NOT DETECTED NOT DETECTED Final   Enterococcus Faecium NOT DETECTED NOT DETECTED Final   Listeria monocytogenes NOT DETECTED NOT DETECTED Final   Staphylococcus species DETECTED (A) NOT DETECTED Final    Comment: CRITICAL RESULT CALLED TO, READ BACK BY AND VERIFIED WITH: J WYLAND,PHARMD@0046  06/29/23 MK    Staphylococcus aureus (BCID) DETECTED (A) NOT DETECTED Final    Comment: Methicillin (oxacillin)-resistant Staphylococcus aureus (MRSA). MRSA is predictably resistant to beta-lactam antibiotics (except ceftaroline). Preferred therapy is vancomycin  unless clinically contraindicated. Patient requires contact precautions if  hospitalized. CRITICAL RESULT CALLED TO, READ BACK BY AND VERIFIED WITH: J [facility]  06/29/23 MK    Staphylococcus epidermidis NOT DETECTED NOT DETECTED Final   Staphylococcus lugdunensis NOT DETECTED NOT DETECTED Final   Streptococcus species NOT DETECTED NOT DETECTED Final   Streptococcus agalactiae NOT DETECTED NOT DETECTED Final   Streptococcus pneumoniae NOT DETECTED NOT DETECTED Final   Streptococcus pyogenes NOT DETECTED NOT DETECTED Final   A.calcoaceticus-baumannii NOT DETECTED NOT  DETECTED Final   Bacteroides fragilis NOT DETECTED NOT DETECTED Final   Enterobacterales DETECTED (A) NOT DETECTED Final    Comment: Enterobacterales represent a large order of gram negative bacteria, not a single organism. CRITICAL RESULT CALLED TO, READ BACK BY AND VERIFIED  WITH:  J [facility]  06/29/23 MK    Enterobacter cloacae complex NOT DETECTED NOT DETECTED Final   Escherichia coli DETECTED (A) NOT DETECTED Final    Comment: CRITICAL RESULT CALLED TO, READ BACK BY AND VERIFIED WITH:  J WYLAND,PHARMD@0046  06/29/23 MK    Klebsiella aerogenes NOT DETECTED NOT DETECTED Final   Klebsiella oxytoca NOT DETECTED NOT DETECTED Final   Klebsiella pneumoniae NOT DETECTED NOT DETECTED Final   Proteus species NOT DETECTED NOT DETECTED Final   Salmonella species NOT DETECTED NOT DETECTED Final   Serratia marcescens NOT DETECTED NOT DETECTED Final   Haemophilus influenzae NOT DETECTED NOT DETECTED Final   Neisseria meningitidis NOT DETECTED NOT DETECTED Final   Pseudomonas aeruginosa NOT DETECTED NOT DETECTED Final   Stenotrophomonas maltophilia NOT DETECTED NOT DETECTED Final   Candida albicans NOT DETECTED NOT DETECTED Final   Candida auris NOT DETECTED NOT DETECTED Final   Candida glabrata NOT DETECTED NOT DETECTED Final   Candida krusei NOT DETECTED NOT DETECTED Final   Candida parapsilosis NOT DETECTED NOT DETECTED Final   Candida tropicalis NOT DETECTED NOT DETECTED Final   Cryptococcus neoformans/gattii NOT DETECTED NOT DETECTED Final   CTX-M ESBL NOT DETECTED NOT DETECTED Final   Carbapenem resistance IMP NOT DETECTED NOT DETECTED Final   Carbapenem resistance KPC NOT DETECTED NOT DETECTED Final   Meth resistant mecA/C and MREJ DETECTED (A) NOT DETECTED Final    Comment: CRITICAL RESULT CALLED TO, READ BACK BY AND VERIFIED WITH:  J WYLAND,PHARMD@0046  06/29/23 MK    Carbapenem resistance NDM NOT DETECTED NOT DETECTED Final   Carbapenem resist OXA 48 LIKE NOT DETECTED NOT DETECTED Final   Carbapenem resistance VIM NOT DETECTED NOT DETECTED Final    Comment: Performed at [facility], [address] [place], [place] [address]  MRSA Next Gen by PCR, Nasal     Status: Abnormal   Collection Time: 06/28/23  8:52 PM   Specimen: Nasal Mucosa;  Nasal Swab  Result Value Ref Range Status   MRSA by PCR Next Gen DETECTED (A) NOT DETECTED Final    Comment: RESULT CALLED TO, READ BACK BY AND VERIFIED WITH: RN Coretha Dew [phone] @ 2302 FH (NOTE) The GeneXpert MRSA Assay (FDA approved for NASAL specimens only), is one component of a comprehensive MRSA colonization surveillance program. It is not intended to diagnose MRSA infection nor to guide or monitor treatment for MRSA infections. Test performance is not FDA approved in patients less than 19 years old. Performed at [facility], [address], [place], [place] [address]   Culture, blood (Routine X 2) w Reflex to ID Panel     Status: None (Preliminary result)   Collection Time: 06/29/23  5:58 AM   Specimen: BLOOD LEFT ARM  Result Value Ref Range Status   Specimen Description BLOOD LEFT ARM  Final   Special Requests   Final    BOTTLES DRAWN AEROBIC AND ANAEROBIC Blood Culture results may not be optimal due to an inadequate volume of blood received in culture bottles   Culture   Final    NO GROWTH 2 DAYS Performed at [facility], [address], [place], [place] [address]    Report Status PENDING  Incomplete  Culture, blood (Routine X 2) w Reflex to ID Panel     Status: None (Preliminary result)   Collection Time: 06/29/23  6:00 AM   Specimen: BLOOD LEFT ARM  Result Value Ref Range Status   Specimen Description BLOOD LEFT ARM  Final   Special Requests   Final    BOTTLES DRAWN AEROBIC AND ANAEROBIC Blood Culture adequate volume   Culture   Final    NO GROWTH 2 DAYS Performed at [facility], [address] [place], [place] [address]    Report Status PENDING  Incomplete  Culture, blood (Routine X 2) w Reflex to ID Panel     Status: None (Preliminary result)   Collection Time: 06/29/23 11:08 AM   Specimen: BLOOD RIGHT HAND  Result Value Ref Range Status   Specimen Description BLOOD RIGHT HAND  Final   Special Requests   Final    BOTTLES DRAWN AEROBIC  AND ANAEROBIC Blood Culture adequate volume   Culture   Final    NO GROWTH 2 DAYS Performed at [facility], [address] [place], [place] [address]    Report Status PENDING  Incomplete  Culture, blood (Routine X 2) w Reflex to ID Panel     Status: None (Preliminary result)   Collection Time: 06/29/23 11:17 AM   Specimen: BLOOD RIGHT ARM  Result Value Ref Range Status   Specimen Description BLOOD RIGHT ARM  Final   Special Requests   Final    BOTTLES DRAWN AEROBIC AND ANAEROBIC Blood Culture adequate volume   Culture   Final    NO GROWTH 2 DAYS Performed at [facility], [address] [place], [place] [address]    Report Status PENDING  Incomplete    Studies/Results: DG CHEST PORT 1 VIEW Result Date: 06/29/2023 CLINICAL DATA:  Respiratory failure, central venous catheter placement EXAM: PORTABLE CHEST 1 VIEW COMPARISON:  None Available. FINDINGS: Right subclavian central venous catheter has been placed with its tip extending superiorly into the expected jugular vein beyond the superior margin of the examination. Endotracheal tube, nasoenteric feeding tube, and right upper extremity PICC line are unchanged. Pulmonary insufflation is stable. Superimposed asymmetric perihilar and upper lung zone airspace infiltrates appear slightly progressive since prior examination, in keeping with infection or noncardiogenic pulmonary edema. No pneumothorax or pleural effusion. Cardiac size is within normal limits. Abdominal aortic stent graft is partially visualized. IMPRESSION: 1. Interval placement of right subclavian central venous catheter with its tip extending superiorly into the expected jugular vein beyond the superior margin of the examination. Repositioning is recommended. 2. Stable support lines and tubes. 3. Progressive asymmetric perihilar and upper lung zone airspace infiltrates, in keeping with infection or noncardiogenic pulmonary edema. These results will be called to the  ordering clinician or representative by the Radiologist Assistant, and communication documented in the PACS or [employer]. Electronically Signed   By: Worthy Heads M.D.   On: 06/29/2023 20:22   ECHOCARDIOGRAM COMPLETE Result Date: 06/29/2023    ECHOCARDIOGRAM REPORT   Patient Name:   Erik [name]. Date of Exam: 06/29/2023 Medical Rec #:  [id number]             Height:       66.0 in Accession #:    [id number]            Weight:       125.7 lb Date of Birth:  08/27/1940  BSA:          1.641 m Patient Age:    83 years              BP:           145/55 mmHg Patient Gender: M                     HR:           60 bpm. Exam Location:  Inpatient Procedure: 2D Echo, Cardiac Doppler and Color Doppler (Both Spectral and Color            Flow Doppler were utilized during procedure). Indications:    Endocarditis I38  History:        Patient has prior history of Echocardiogram examinations, most                 recent 06/24/2023. Cardiomyopathy, Previous Myocardial Infarction                 and CAD, COPD, Endocarditis, Signs/Symptoms:Murmur; Risk                 Factors:Hypertension and Dyslipidemia.  Sonographer:    Terrilee Few RCS Referring Phys: ADITYA PALIWAL IMPRESSIONS  1. Left ventricular ejection fraction, by estimation, is 50 to 55%. The left ventricle has low normal function. The left ventricle has no regional wall motion abnormalities. Left ventricular diastolic parameters are consistent with Grade II diastolic dysfunction (pseudonormalization).  2. Right ventricular systolic function is normal. The right ventricular size is normal.  3. Left atrial size was moderately dilated.  4. RA mass seen in images likely represents prominent eustachian valve. Right atrial size was mildly dilated.  5. Restricted posterior leaflet with at least moderate MR. Appears worsened since last echo. The mitral valve is degenerative. Moderate mitral valve regurgitation. Mild mitral stenosis. The mean mitral  valve gradient is 6.0 mmHg. Severe mitral annular calcification.  6. Tricuspid valve regurgitation is mild to moderate.  7. The aortic valve is calcified. Aortic valve regurgitation is severe. No aortic stenosis is present.  8. The inferior vena cava is normal in size with greater than 50% respiratory variability, suggesting right atrial pressure of 3 mmHg. FINDINGS  Left Ventricle: Left ventricular ejection fraction, by estimation, is 50 to 55%. The left ventricle has low normal function. The left ventricle has no regional wall motion abnormalities. The left ventricular internal cavity size was normal in size. There is no left ventricular hypertrophy. Left ventricular diastolic parameters are consistent with Grade II diastolic dysfunction (pseudonormalization). Right Ventricle: The right ventricular size is normal. No increase in right ventricular wall thickness. Right ventricular systolic function is normal. Left Atrium: Left atrial size was moderately dilated. Right Atrium: RA mass seen in images likely represents prominent eustachian valve. Right atrial size was mildly dilated. Prominent Eustachian valve. Pericardium: There is no evidence of pericardial effusion. Mitral Valve: Restricted posterior leaflet with at least moderate MR. Appears worsened since last echo. The mitral valve is degenerative in appearance. Severe mitral annular calcification. Moderate mitral valve regurgitation. Mild mitral valve stenosis. MV peak gradient, 13.8 mmHg. The mean mitral valve gradient is 6.0 mmHg. There is no evidence of mitral valve vegetation. Tricuspid Valve: The tricuspid valve is normal in structure. Tricuspid valve regurgitation is mild to moderate. No evidence of tricuspid stenosis. There is no evidence of tricuspid valve vegetation. Aortic Valve: The aortic valve is calcified. Aortic valve regurgitation is severe. No aortic stenosis is present. Aortic  valve mean gradient measures 9.5 mmHg. Aortic valve peak gradient  measures 18.5 mmHg. Aortic valve area, by VTI measures 1.30 cm. Pulmonic Valve: The pulmonic valve was normal in structure. Pulmonic valve regurgitation is not visualized. No evidence of pulmonic stenosis. Aorta: The aortic root is normal in size and structure. Venous: The inferior vena cava is normal in size with greater than 50% respiratory variability, suggesting right atrial pressure of 3 mmHg. IAS/Shunts: No atrial level shunt detected by color flow Doppler.  LEFT VENTRICLE PLAX 2D LVIDd:         4.70 cm   Diastology LVIDs:         3.40 cm   LV e' medial:    5.87 cm/s LV PW:         1.00 cm   LV E/e' medial:  27.8 LV IVS:        1.20 cm   LV e' lateral:   9.36 cm/s LVOT diam:     1.80 cm   LV E/e' lateral: 17.4 LV SV:         66 LV SV Index:   40 LVOT Area:     2.54 cm  RIGHT VENTRICLE             IVC RV S prime:     15.10 cm/s  IVC diam: 2.80 cm TAPSE (M-mode): 2.6 cm LEFT ATRIUM             Index        RIGHT ATRIUM           Index LA diam:        4.60 cm 2.80 cm/m   RA Area:     15.10 cm LA Vol (A2C):   93.7 ml 57.09 ml/m  RA Volume:   36.30 ml  22.12 ml/m LA Vol (A4C):   84.7 ml 51.60 ml/m LA Biplane Vol: 89.4 ml 54.47 ml/m  AORTIC VALVE AV Area (Vmax):    1.37 cm AV Area (Vmean):   1.40 cm AV Area (VTI):     1.30 cm AV Vmax:           215.00 cm/s AV Vmean:          139.500 cm/s AV VTI:            0.506 m AV Peak Grad:      18.5 mmHg AV Mean Grad:      9.5 mmHg LVOT Vmax:         115.67 cm/s LVOT Vmean:        76.733 cm/s LVOT VTI:          0.259 m LVOT/AV VTI ratio: 0.51  AORTA Ao Root diam: 3.00 cm Ao Asc diam:  3.20 cm MITRAL VALVE MV Area (PHT): 2.30 cm     SHUNTS MV Area VTI:   1.00 cm     Systemic VTI:  0.26 m MV Peak grad:  13.8 mmHg    Systemic Diam: 1.80 cm MV Mean grad:  6.0 mmHg MV Vmax:       1.86 m/s MV Vmean:      116.5 cm/s MV Decel Time: 330 msec MV E velocity: 163.00 cm/s MV A velocity: 161.00 cm/s MV E/A ratio:  1.01 Arta Lark Electronically signed by Arta Lark  Signature Date/Time: 06/29/2023/4:22:17 PM    Final       Assessment/Plan:  INTERVAL HISTORY:    PICC line out  Principal Problem:   Polymicrobial bacterial infection Active Problems:  Hyperlipidemia   Bilateral carotid artery stenosis   COPD, mild (HCC)   BPH (benign prostatic hyperplasia)   Family history of coronary artery disease in brother   CAD S/P percutaneous coronary angioplasty   Chronic systolic heart failure (HCC)   HTN (hypertension)   BPH with obstruction/lower urinary tract symptoms   Abdominal aortic aneurysm (AAA) (HCC)   Restless leg syndrome   MSSA bacteremia   Abdominal pain   Anemia   SBO (small bowel obstruction) (HCC)   S/P exploratory laparotomy   E coli bacteremia   PICC line infection   Small bowel obstruction (HCC)    Erik [name]. is a 83 y.o. 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 coli.  MRSA infection is suspected to be nosocomial given timing of bacteremia and given pathogen.  E. coli certainly could have come from his necrotic colon.  #1 MRSA and E coli bacteremia:  E coli MIC to unasyn is 8 but after conversation with Skeet Duke we wwould feel more comfortable with current regimen for now  Continue daptomycin  and Zosyn    Central line was taken out today and I am ordering repeat blood cultures now  Greatly appreciate critical care General Surgery and vascular surgery.  Continue contact precautions for MRSA  CRITICAL CARE Performed by: Deberah [place]   Total critical care time: 32 minutes  Critical care time was exclusive of separately billable procedures and treating other patients.  Critical care was necessary to treat or prevent imminent or life-threatening deterioration.  Critical care was time spent personally by me on the following  activities: development of treatment plan with patient and/or surrogate as well as nursing, discussions with consultants, evaluation of patient's response to treatment, examination of patient, obtaining history from patient or surrogate, ordering and performing treatments and interventions, ordering and review of laboratory studies, ordering and review of radiographic studies, pulse oximetry and re-evaluation of patient's condition.  Evaluation of the patient requires complex antimicrobial therapy evaluation, counseling , isolation needs to reduce disease transmission and risk assessment and mitigation.   I am available for questions this weekend and Dr Levern Reader will take over on Monday.    LOS: 11 days   Sheela Denmark 07/01/2023, 1:22 PM

## 2023-07-01 NOTE — Evaluation (Signed)
 Clinical/Bedside Swallow Evaluation Patient Details  Name: Erik [name]. MRN: [id number] Date of Birth: 1940-06-29  Today's Date: 07/01/2023 Time: SLP Start Time (ACUTE ONLY): 1409 SLP Stop Time (ACUTE ONLY): 1431 SLP Time Calculation (min) (ACUTE ONLY): 22 min  Past Medical History:  Past Medical History:  Diagnosis Date   Adenomatous colon polyp    Aortic aneurysm (HCC)    BPH (benign prostatic hypertrophy)    CAD (coronary artery disease)    Carotid artery stenosis    a. Bilateral CEA   Cataract    CHF (congestive heart failure) (HCC)    Chronic systolic heart failure (HCC)    a. EF 20-25%, mild LVH, mod HK, mid apicalanteroseptal myocardium, mild MR, LA mod dilated   Collagen vascular disease (HCC)    COPD (chronic obstructive pulmonary disease) (HCC)    Coronary artery disease    a. LHC (08/2013): Lmain: short 30% distal, LAD: sml D1 & D2, 70% ostial D1, 95-99% LAD stenosis prox D2 LCx: sml/mod ramus subtot. occluded, 40% ostial set off lg OM1, 40% AV LCx after OM1, RCA: 90% prox (DES to RCA and prox LAD)   Diverticulosis    GERD (gastroesophageal reflux disease)    Heart murmur    History of colonic polyps    Hyperlipidemia    Hyperplastic colon polyp    Hypertension    Ischemic cardiomyopathy    RLS (restless legs syndrome)    Past Surgical History:  Past Surgical History:  Procedure Laterality Date   ABDOMINAL AORTIC ENDOVASCULAR STENT GRAFT Left 06/28/2023   Procedure: INSERTION OF GORE VIABAHN VBX 6mm X 19mm STENT AND 28.82mm X 4.5cm GORE EXLUDER STENT;  Surgeon: Philipp Brawn, MD;  Location: [facility] OR;  Service: Vascular;  Laterality: Left;   AORTOGRAM N/A 06/20/2023   Procedure: MESENTERIC AORTOGRAM;  Surgeon: Kayla Part, MD;  Location: [facility] OR;  Service: Vascular;  Laterality: N/A;   AORTOGRAM N/A 06/28/2023   Procedure: AORTOGRAM;  Surgeon: Philipp Brawn, MD;  Location: [facility] OR;  Service: Vascular;  Laterality: N/A;   BUBBLE STUDY  02/03/2022    Procedure: BUBBLE STUDY;  Surgeon: Sonny Dust, MD;  Location: [facility] ENDOSCOPY;  Service: Cardiovascular;;   cardiac stents  09-2013   CAROTID ENDARTERECTOMY  04/17/2008   right   CAROTID ENDARTERECTOMY  05/30/08   Left   CATARACT EXTRACTION W/PHACO Right 10/05/2018   Procedure: CATARACT EXTRACTION PHACO AND INTRAOCULAR LENS PLACEMENT (IOC), RIGHT;  Surgeon: Clair Crews, MD;  Location: [facility] ORS;  Service: Ophthalmology;  Laterality: Right;  us  01:06.4 cde 12.79 Fluid Pack Lot # [id number]   CATARACT EXTRACTION W/PHACO Left 11/02/2018   Procedure: CATARACT EXTRACTION PHACO AND INTRAOCULAR LENS PLACEMENT (IOC), LEFT;  Surgeon: Clair Crews, MD;  Location: [facility] ORS;  Service: Ophthalmology;  Laterality: Left;  US   01:10 CDE 11.52 Fluid pack lot # [id number]   CHOLECYSTECTOMY     Gall Bladder   COLECTOMY WITH COLOSTOMY CREATION/HARTMANN PROCEDURE N/A 06/28/2023   Procedure: COLECTOMY, WITH ILEOSTOMY CREATION;  Surgeon: Caralyn Chandler, MD;  Location: [facility] OR;  Service: General;  Laterality: N/A;   CORONARY ANGIOGRAM  09/11/2013   Procedure: CORONARY ANGIOGRAM;  Surgeon: Millicent Ally, MD;  Location: [facility] CATH LAB;  Service: Cardiovascular;;   CORONARY ANGIOPLASTY     STENTS   HERNIA REPAIR     INSERTION OF ILIAC STENT N/A 06/20/2023   Procedure: INSERTION VIABAHN 7x29 STENT IN SUPERIOR MESENTERIC ARTERY;  Surgeon: Kayla Part, MD;  Location: [facility]  OR;  Service: Vascular;  Laterality: N/A;  Insertion Mesenteric Artery Stent   LAPAROSCOPY N/A 06/20/2023   Procedure: LAPAROSCOPY, DIAGNOSTIC;  Surgeon: Dorena Gander, MD;  Location: [facility] OR;  Service: General;  Laterality: N/A;   LAPAROSCOPY N/A 06/28/2023   Procedure: LAPAROSCOPY, DIAGNOSTIC;  Surgeon: Caralyn Chandler, MD;  Location: [facility] OR;  Service: General;  Laterality: N/A;   LAPAROTOMY N/A 06/28/2023   Procedure: LAPAROTOMY, EXPLORATORY;  Surgeon: Caralyn Chandler, MD;  Location: [facility] OR;  Service: General;  Laterality: N/A;   lower aorta  aneurysm  05/26/2015   [facility]   PERCUTANEOUS STENT INTERVENTION  09/11/2013   Procedure: PERCUTANEOUS STENT INTERVENTION;  Surgeon: Millicent Ally, MD;  Location: [facility] CATH LAB;  Service: Cardiovascular;;  DES Prox RCA 3.5x15 xience    TEE WITHOUT CARDIOVERSION N/A 02/03/2022   Procedure: TRANSESOPHAGEAL ECHOCARDIOGRAM (TEE);  Surgeon: Sonny Dust, MD;  Location: [facility] ENDOSCOPY;  Service: Cardiovascular;  Laterality: N/A;   HPI:  [name] is an 83 yo male presenting to ED 4/7 with abdominal pain. CT showed early SBO with stenosis of the celiac and SMA. Underwent SMA stent for acute mesenteric ischemia on 4/7 with continued abdominal pain. Found to have an endoleak s/p ex lap revealing a necrotic R colon. R colectomy done with ostomy formation, L renal artery and aortic artery stent 4/15. ETT 4/15-4/18. Cortrak placed 4/18. No history of SLP visits per chart, although note recommendations to undergo w/u for chronic aspiration given bilateral scattered, centrilobar nodules in the dependent segments of the R middle, L upper, and bilateral lower lobes 06/2022. PMH includes CAD, HFrEF, collagen vascular disease, COPD, HLD, HTN, ischemic cardiomyopathy    Assessment / Plan / Recommendation  Clinical Impression  Per MD, pt can advance to clear liquid diet per SLP discretion. RN present during session to remove large bore NG tube. Small sips of water  did not result in s/s of aspiration, although intermittent coughing was noted when pt was challenged to take sequential sips. Repeated 3 oz water  test s/p NG tube removal which replicated immediate coughing. Pt had prompt and complete mastication with ice chips and gelatin. Sequential sips of nectar thick liquids did not cause coughing. Suspect pt's performance may primarily be impacted by recent extubation, although given concern for chronic aspiration based on CT Chest 06/2022 recommend completing an MBS regardless of clinical improvement. Since pt was  only just cleared for a PO diet, MD asks to hold MBS until at least Monday. For now, recommend initiating a clear liquid diet thickened to nectar consistency. Cortrak is in place for medication administration. SLP will continue following to assess progress and coordinate readiness to participate in an instrumental swallow study. SLP Visit Diagnosis: Dysphagia, unspecified (R13.10)    Aspiration Risk  Moderate aspiration risk    Diet Recommendation Nectar-thick liquid;Other (Comment) (nectar thick clear liquid diet)    Liquid Administration via: Spoon;Cup;Straw Medication Administration: Via alternative means Supervision: Staff to assist with self feeding;Full supervision/cueing for compensatory strategies Compensations: Minimize environmental distractions;Slow rate;Small sips/bites Postural Changes: Seated upright at 90 degrees;Remain upright for at least 30 minutes after po intake    Other  Recommendations Oral Care Recommendations: Oral care BID Caregiver Recommendations: Avoid jello, ice cream, thin soups, popsicles;Remove water  pitcher    Recommendations for follow up therapy are one component of a multi-disciplinary discharge planning process, led by the attending physician.  Recommendations may be updated based on patient status, additional functional criteria and insurance authorization.  Follow up  Recommendations Follow physician's recommendations for discharge plan and follow up therapies      Assistance Recommended at Discharge    Functional Status Assessment Patient has had a recent decline in their functional status and demonstrates the ability to make significant improvements in function in a reasonable and predictable amount of time.  Frequency and Duration min 2x/week  2 weeks       Prognosis Prognosis for improved oropharyngeal function: Good Barriers to Reach Goals: Time post onset      Swallow Study   General HPI: Oslo Huntsman is an 83 yo male presenting  to ED 4/7 with abdominal pain. CT showed early SBO with stenosis of the celiac and SMA. Underwent SMA stent for acute mesenteric ischemia on 4/7 with continued abdominal pain. Found to have an endoleak s/p ex lap revealing a necrotic R colon. R colectomy done with ostomy formation, L renal artery and aortic artery stent 4/15. ETT 4/15-4/18. Cortrak placed 4/18. No history of SLP visits per chart, although note recommendations to undergo w/u for chronic aspiration given bilateral scattered, centrilobar nodules in the dependent segments of the R middle, L upper, and bilateral lower lobes 06/2022. PMH includes CAD, HFrEF, collagen vascular disease, COPD, HLD, HTN, ischemic cardiomyopathy Type of Study: Bedside Swallow Evaluation Previous Swallow Assessment: none in chart Diet Prior to this Study: NPO;Large bore NG tube;Cortrak/Small bore NG tube Temperature Spikes Noted: No Respiratory Status: Nasal cannula History of Recent Intubation: Yes Total duration of intubation (days): 4 days Date extubated: 07/01/23 Behavior/Cognition: Alert;Cooperative Oral Cavity Assessment: Within Functional Limits Oral Care Completed by SLP: No Oral Cavity - Dentition: Adequate natural dentition Vision: Functional for self-feeding Self-Feeding Abilities: Needs assist Patient Positioning: Upright in bed Baseline Vocal Quality: Normal Volitional Cough: Strong (stifles due to abdominal pain) Volitional Swallow: Able to elicit    Oral/Motor/Sensory Function Overall Oral Motor/Sensory Function: Within functional limits   Ice Chips Ice chips: Not tested   Thin Liquid Thin Liquid: Impaired Presentation: Straw Pharyngeal  Phase Impairments: Cough - Immediate    Nectar Thick Nectar Thick Liquid: Not tested   Honey Thick Honey Thick Liquid: Not tested   Puree Puree: Within functional limits Presentation: Spoon   Solid     Solid: Not tested      Amil Kale, M.A., CCC-SLP Speech Language Pathology, Acute  Rehabilitation Services  Secure Chat preferred [phone]  07/01/2023,3:24 PM

## 2023-07-02 DIAGNOSIS — K529 Noninfective gastroenteritis and colitis, unspecified: Secondary | ICD-10-CM

## 2023-07-02 DIAGNOSIS — Z7189 Other specified counseling: Secondary | ICD-10-CM

## 2023-07-02 DIAGNOSIS — A4902 Methicillin resistant Staphylococcus aureus infection, unspecified site: Secondary | ICD-10-CM

## 2023-07-02 DIAGNOSIS — A499 Bacterial infection, unspecified: Secondary | ICD-10-CM

## 2023-07-02 DIAGNOSIS — Z515 Encounter for palliative care: Secondary | ICD-10-CM

## 2023-07-02 DIAGNOSIS — T827XXA Infection and inflammatory reaction due to other cardiac and vascular devices, implants and grafts, initial encounter: Secondary | ICD-10-CM

## 2023-07-02 DIAGNOSIS — Z66 Do not resuscitate: Secondary | ICD-10-CM

## 2023-07-02 DIAGNOSIS — B9620 Unspecified Escherichia coli [E. coli] as the cause of diseases classified elsewhere: Secondary | ICD-10-CM

## 2023-07-02 LAB — CBC
HCT: 27.2 % — ABNORMAL LOW (ref 39.0–52.0)
Hemoglobin: 8.7 g/dL — ABNORMAL LOW (ref 13.0–17.0)
MCH: 27.8 pg (ref 26.0–34.0)
MCHC: 32 g/dL (ref 30.0–36.0)
MCV: 86.9 fL (ref 80.0–100.0)
Platelets: 150 10*3/uL (ref 150–400)
RBC: 3.13 MIL/uL — ABNORMAL LOW (ref 4.22–5.81)
RDW: 18.4 % — ABNORMAL HIGH (ref 11.5–15.5)
WBC: 10.5 10*3/uL (ref 4.0–10.5)
nRBC: 0 % (ref 0.0–0.2)

## 2023-07-02 LAB — PHOSPHORUS: Phosphorus: 3.3 mg/dL (ref 2.5–4.6)

## 2023-07-02 LAB — GLUCOSE, CAPILLARY
Glucose-Capillary: 114 mg/dL — ABNORMAL HIGH (ref 70–99)
Glucose-Capillary: 122 mg/dL — ABNORMAL HIGH (ref 70–99)
Glucose-Capillary: 129 mg/dL — ABNORMAL HIGH (ref 70–99)
Glucose-Capillary: 139 mg/dL — ABNORMAL HIGH (ref 70–99)
Glucose-Capillary: 145 mg/dL — ABNORMAL HIGH (ref 70–99)
Glucose-Capillary: 98 mg/dL (ref 70–99)

## 2023-07-02 LAB — BASIC METABOLIC PANEL WITH GFR
Anion gap: 5 (ref 5–15)
BUN: 60 mg/dL — ABNORMAL HIGH (ref 8–23)
CO2: 25 mmol/L (ref 22–32)
Calcium: 7.8 mg/dL — ABNORMAL LOW (ref 8.9–10.3)
Chloride: 125 mmol/L — ABNORMAL HIGH (ref 98–111)
Creatinine, Ser: 1.65 mg/dL — ABNORMAL HIGH (ref 0.61–1.24)
GFR, Estimated: 41 mL/min — ABNORMAL LOW (ref >60)
Glucose, Bld: 129 mg/dL — ABNORMAL HIGH (ref 70–99)
Potassium: 3.7 mmol/L (ref 3.5–5.1)
Sodium: 155 mmol/L — ABNORMAL HIGH (ref 135–145)

## 2023-07-02 LAB — MAGNESIUM: Magnesium: 2.6 mg/dL — ABNORMAL HIGH (ref 1.7–2.4)

## 2023-07-02 MED ORDER — GABAPENTIN 250 MG/5ML PO SOLN
100.0000 mg | Freq: Three times a day (TID) | ORAL | Status: DC
  Administered 2023-07-02 – 2023-07-03 (×3): 100 mg via ORAL
  Filled 2023-07-02 (×4): qty 2

## 2023-07-02 MED ORDER — METHOCARBAMOL 500 MG PO TABS
500.0000 mg | ORAL_TABLET | Freq: Three times a day (TID) | ORAL | Status: DC | PRN
  Administered 2023-07-02 – 2023-07-03 (×2): 500 mg
  Filled 2023-07-02 (×2): qty 1

## 2023-07-02 MED ORDER — GABAPENTIN 250 MG/5ML PO SOLN
200.0000 mg | Freq: Once | ORAL | Status: AC
  Administered 2023-07-02: 200 mg via ORAL
  Filled 2023-07-02: qty 4

## 2023-07-02 MED ORDER — FREE WATER
300.0000 mL | Status: DC
  Administered 2023-07-02 – 2023-07-03 (×7): 300 mL

## 2023-07-02 MED ORDER — FREE WATER: 200.0000 mL | Status: DC

## 2023-07-02 MED ORDER — LORAZEPAM 2 MG/ML IJ SOLN
0.5000 mg | Freq: Every evening | INTRAMUSCULAR | Status: DC | PRN
  Filled 2023-07-02: qty 1

## 2023-07-02 NOTE — Consult Note (Signed)
 Consultation Note Date: 07/02/2023   Patient Name: Erik [name].  DOB: [birth year]  MRN: [id number]  Age / Sex: 83 y.o., male  PCP: Judithann Novas, MD Referring Physician: Wilfredo Hanly, MD  Reason for Consultation: Establishing goals of care  HPI/Patient Profile: 83 y.o. male  with past medical history of CAD, HFrEF, collagen vascular disease, COPD, HLD, HTN, and ischemic cardiomyopathy admitted on 06/19/2023 with abdominal pain. His initial CT showed early SBO with stenosis of the celiac and SMA. He was seen by vascular and underwent an SMA stent for acute mesenteric ischemia on 4/7, he continued to have abdominal pain and found to have an endoleak, this was discussed with general surgery and he was tken to the OR for an ex lap which revealed a necrotic R colon. R colectomy was done with ostomy formation. Vascular surgery also placed a L renal artery and aortic artery stent. He required pressors and ventilator support post op. Extubated 4/18 and off pressors.  Vascular surgery concerned with graft infection from bacteremia and patient is not a candidate for graft explant.  Vascular surgery has recommended palliative approach.  PMT consulted by critical care team.  Clinical Assessment and Goals of Care: I have reviewed medical records including EPIC notes, labs and imaging, received report from RN and Dr. Diania Fortes, assessed the patient and then met with patient's daughter Erik [name] to discuss diagnosis prognosis, GOC, EOL wishes, disposition and options.  Unfortunately patient's wife was up with him the majority of the night and so has went home to get some much-needed sleep.  Daughter agrees to discussing situation but we both recognize we will need to speak with patient's wife before any decisions can be confirmed.  I introduced Palliative Medicine as specialized medical care for people living with serious illness. It focuses on providing relief from  the symptoms and stress of a serious illness. The goal is to improve quality of life for both the patient and the family.  Archibald Beard shares patient was doing very well at baseline.  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  Just recently drove his wife to the beach.  Cognitively intact.  His main struggle is with his restless leg syndrome.  She did share of a decreased appetite and a chronic cough as well as ongoing smoking.   We discussed patient's current illness and what it means in the larger context of patient's on-going co-morbidities.  Natural disease trajectory and expectations at EOL were discussed.  We discussed his long complicated hospitalization and concern with endograft infection.  Discussed vascular surgery does not feel he is a candidate for explant.  Daughter is aware that hospice has been recommended.  I attempted to elicit values and goals of care important to the patient.    The difference between aggressive medical intervention and comfort care was considered in light of the patient's goals of care.   Daughter shares the family is open to a comfort approach to [name]'s care and is open to the support of hospice.  We discussed hospice services at home versus hospice facility.  There is an issue with bed bugs in the home and some concern about the amount of care [name] would need so hospice facility is being strongly considered.  We reviewed that our team will follow up with patient's spouse tomorrow to further discuss decisions.  Discussed with daughter the importance of continued conversation with family and the medical providers regarding overall plan of care and treatment  options, ensuring decisions are within the context of the patient's values and GOCs.    Questions and concerns were addressed. The family was encouraged to call with questions or concerns.  Dr. Diania Fortes and RN updated on the above.  Primary Decision Maker NEXT OF KIN -wife Erik [name]    SUMMARY OF RECOMMENDATIONS   -Initial goals of care discussion with patient's daughter, seems family is open to comfort approach and hospice involvement, need further discussion to include patient's wife/unfortunately she had been up all night with patient and was exhausted today -plan to meet with her 4/20  Code Status/Advance Care Planning: DNR  Discharge Planning: considering home with hospice vs hospice facility     Primary Diagnoses: Present on Admission:  Abdominal pain  Hyperlipidemia  Bilateral carotid artery stenosis  COPD, mild (HCC)  BPH (benign prostatic hyperplasia)  Chronic systolic heart failure (HCC)  HTN (hypertension)  BPH with obstruction/lower urinary tract symptoms  Abdominal aortic aneurysm (AAA) (HCC)  Restless leg syndrome  SBO (small bowel obstruction) (HCC)  MSSA bacteremia   I have reviewed the medical record, interviewed the patient and family, and examined the patient. The following aspects are pertinent.  Past Medical History:  Diagnosis Date   Adenomatous colon polyp    Aortic aneurysm (HCC)    BPH (benign prostatic hypertrophy)    CAD (coronary artery disease)    Carotid artery stenosis    a. Bilateral CEA   Cataract    CHF (congestive heart failure) (HCC)    Chronic systolic heart failure (HCC)    a. EF 20-25%, mild LVH, mod HK, mid apicalanteroseptal myocardium, mild MR, LA mod dilated   Collagen vascular disease (HCC)    COPD (chronic obstructive pulmonary disease) (HCC)    Coronary artery disease    a. LHC (08/2013): Lmain: short 30% distal, LAD: sml D1 & D2, 70% ostial D1, 95-99% LAD stenosis prox D2 LCx: sml/mod ramus subtot. occluded, 40% ostial set off lg OM1, 40% AV LCx after OM1, RCA: 90% prox (DES to RCA and prox LAD)   Diverticulosis    GERD (gastroesophageal reflux disease)    Heart murmur    History of colonic polyps    Hyperlipidemia    Hyperplastic colon polyp    Hypertension    Ischemic cardiomyopathy     RLS (restless legs syndrome)    Social History   Socioeconomic History   Marital status: Married    Spouse name: Not on file   Number of children: Not on file   Years of education: Not on file   Highest education level: Not on file  Occupational History   Occupation: Retired Radiographer, therapeutic  Tobacco Use   Smoking status: Every Day    Current packs/day: 1.00    Average packs/day: 1 pack/day for 50.0 years (50.0 ttl pk-yrs)    Types: Cigarettes    Passive exposure: Never   Smokeless tobacco: Current   Tobacco comments:    off and on since age 40, currently smokes a pack a day [facility] 12/23/20  Vaping Use   Vaping status: Never Used  Substance and Sexual Activity   Alcohol  use: No   Drug use: No   Sexual activity: Yes  Other Topics Concern   Not on file  Social History Narrative   Lives with wife in [place]. Retired from the post office   Social Drivers of Corporate investment banker Strain: Low Risk  (04/29/2021)   Overall Financial Resource Strain (CARDIA)  Difficulty of Paying Living Expenses: Not very hard  Food Insecurity: No Food Insecurity (06/20/2023)   Hunger Vital Sign    Worried About Running Out of Food in the Last Year: Never true    Ran Out of Food in the Last Year: Never true  Transportation Needs: No Transportation Needs (06/20/2023)   PRAPARE - Administrator, Civil Service (Medical): No    Lack of Transportation (Non-Medical): No  Physical Activity: Inactive (05/31/2019)   Exercise Vital Sign    Days of Exercise per Week: 0 days    Minutes of Exercise per Session: 0 min  Stress: No Stress Concern Present (05/31/2019)   [employer] of Occupational Health - Occupational Stress Questionnaire    Feeling of Stress : Not at all  Social Connections: Not on file   Family History  Problem Relation Age of Onset   Heart disease Brother    Hyperlipidemia Brother    Pancreatic cancer Brother    Hypertension Mother    Cancer Father        unknown type;  sounds GI   Emphysema Father    Hyperlipidemia Brother    Hyperlipidemia Brother    Heart attack Brother    Hyperlipidemia Brother    Multiple sclerosis Brother    Kidney disease Neg Hx    Prostate cancer Neg Hx    Colon cancer Neg Hx    Esophageal cancer Neg Hx    Rectal cancer Neg Hx    Stomach cancer Neg Hx    Scheduled Meds:  arformoterol   15 mcg Nebulization BID   aspirin   81 mg Per Tube Daily   budesonide  (PULMICORT ) nebulizer solution  0.25 mg Nebulization BID   Chlorhexidine  Gluconate Cloth  6 each Topical Daily   free water   300 mL Per Tube Q4H   gabapentin   100 mg Oral Q8H   heparin  injection (subcutaneous)  5,000 Units Subcutaneous Q8H   insulin  aspart  0-9 Units Subcutaneous Q4H   melatonin  3 mg Per Tube QHS   mouth rinse  15 mL Mouth Rinse 4 times per day   oxyCODONE   7.5 mg Per Tube Q6H   pantoprazole  (PROTONIX ) IV  40 mg Intravenous Q24H   revefenacin   175 mcg Nebulization Daily   sodium chloride  flush  3 mL Intravenous Q12H   Continuous Infusions:  DAPTOmycin  Stopped (07/01/23 1426)   feeding supplement (VITAL 1.5 CAL) 45 mL/hr at 07/02/23 0900   piperacillin -tazobactam (ZOSYN )  IV 12.5 mL/hr at 07/02/23 0900   PRN Meds:.fentaNYL  (SUBLIMAZE ) injection, Gerhardt's butt cream, labetalol , LORazepam , methocarbamol , ondansetron  (ZOFRAN ) IV, mouth rinse, sodium chloride  flush Allergies  Allergen Reactions   Codeine  Anaphylaxis and Swelling    throat swells   Heparin  Other (See Comments)    R/O HIT; Patient's wife stated that heparin  was not an allergy  of the patient; She is unaware of a heparin  allergy    Review of Systems  Unable to perform ROS: Mental status change    Physical Exam Constitutional:      General: He is not in acute distress.    Appearance: He is ill-appearing.     Comments: Wakes to voice, confused  Pulmonary:     Effort: Pulmonary effort is normal.  Skin:    General: Skin is warm and dry.     Vital Signs: BP 137/65   Pulse 93    Temp 99.1 F (37.3 C)   Resp (!) 27   Ht 5\' 6"  (1.676 m)   Wt  59.2 kg   SpO2 97%   BMI 21.07 kg/m  Pain Scale: 0-10 POSS *See Group Information*: S-Acceptable,Sleep, easy to arouse Pain Score: 7    SpO2: SpO2: 97 % O2 Device:SpO2: 97 % O2 Flow Rate: .O2 Flow Rate (L/min): 4 L/min  IO: Intake/output summary:  Intake/Output Summary (Last 24 hours) at 07/02/2023 1046 Last data filed at 07/02/2023 0900 Gross per 24 hour  Intake 1378.17 ml  Output 4135 ml  Net -2756.83 ml    LBM: Last BM Date : 07/02/23 Baseline Weight: Weight: 50.8 kg Most recent weight: Weight: 59.2 kg      *Please note that this is a verbal dictation therefore any spelling or grammatical errors are due to the "[name]" system interpretation.   Time Total: 80 minutes Time spent includes: Detailed review of medical records (labs, imaging, vital signs), medically appropriate exam, discussion with treatment team, counseling and educating patient, family and/or staff, documenting clinical information, medication management and coordination of care.    Alvino Aye, DNP, AGNP-C Palliative Medicine Team [phone] Pager: [phone]

## 2023-07-02 NOTE — Progress Notes (Signed)
 eLink Physician-Brief Progress Note Patient Name: Erik [name]. DOB: [birth year] MRN: [id number]   Date of Service  07/02/2023  HPI/Events of Note  Complaining of restless leg with discomfort, AKI  eICU Interventions  Gabapentin  200 mg x 1.     Intervention Category Minor Interventions: Routine modifications to care plan (e.g. PRN medications for pain, fever)  Erik [name] 07/02/2023, 3:15 AM

## 2023-07-02 NOTE — Progress Notes (Signed)
 4 Days Post-Op  Subjective: Extubated yesterday. Tolerating tube feeds at goal. Cleared for nectar thick diet by SLP.   Objective: Vital signs in last 24 hours: Temp:  [98.4 F (36.9 C)-99.3 F (37.4 C)] 98.4 F (36.9 C) (04/19 1100) Pulse Rate:  [83-96] 83 (04/19 1100) Resp:  [14-29] 16 (04/19 1100) BP: (104-143)/(50-79) 116/57 (04/19 1100) SpO2:  [92 %-97 %] 96 % (04/19 1100) Arterial Line BP: (172-178)/(45-51) 178/51 (04/18 1431) FiO2 (%):  [36 %] 36 % (04/19 0400) Weight:  [59.2 kg] 59.2 kg (04/19 0500) Last BM Date : 07/02/23  Intake/Output from previous day: 04/18 0701 - 04/19 0700 In: 1413.4 [I.V.:202.4; NG/GT:1003.3; IV Piggyback:207.6] Out: 4000 [Urine:3030; Stool:970] Intake/Output this shift: Total I/O In: 114.9 [NG/GT:90; IV Piggyback:24.9] Out: 500 [Urine:350; Stool:150]  FiO2 (%):  [36 %] 36 %  PE: Gen:  Alert, appears frail Abd: Soft, nondistended, appropriately tender. Ileostomy productive of liquid stool.   Lab Results:  Recent Labs    07/01/23 0448 07/02/23 0643  WBC 10.3 10.5  HGB 8.3* 8.7*  HCT 25.0* 27.2*  PLT 112* 150   BMET Recent Labs    06/30/23 0412 06/30/23 1117 07/02/23 0643  NA 149* 150* 155*  K 3.1* 3.5 3.7  CL 114*  --  125*  CO2 22  --  25  GLUCOSE 142*  --  129*  BUN 65*  --  60*  CREATININE 1.58*  --  1.65*  CALCIUM  7.0*  --  7.8*   PT/INR No results for input(s): "LABPROT", "INR" in the last 72 hours.  CMP     Component Value Date/Time   NA 155 (H) 07/02/2023 0643   NA 138 08/18/2018 1135   K 3.7 07/02/2023 0643   CL 125 (H) 07/02/2023 0643   CO2 25 07/02/2023 0643   GLUCOSE 129 (H) 07/02/2023 0643   BUN 60 (H) 07/02/2023 0643   BUN 19 08/18/2018 1135   CREATININE 1.65 (H) 07/02/2023 0643   CREATININE 1.11 10/09/2021 1646   CALCIUM  7.8 (L) 07/02/2023 0643   PROT 5.1 (L) 06/30/2023 0412   ALBUMIN  2.0 (L) 06/30/2023 0412   AST 24 06/30/2023 0412   ALT 27 06/30/2023 0412   ALKPHOS 50 06/30/2023 0412    BILITOT 1.0 06/30/2023 0412   GFRNONAA 41 (L) 07/02/2023 0643   GFRAA 95 08/18/2018 1135   Lipase     Component Value Date/Time   LIPASE 61 (H) 03/09/2022 0812    Studies/Results: DG Abd Portable 1V Result Date: 07/01/2023 CLINICAL DATA:  [id number] Encounter for feeding tube placement [id number] EXAM: PORTABLE ABDOMEN - 1 VIEW COMPARISON:  Multiple, 07/01/2023 FINDINGS: Esophagogastric tube terminates in the region of the stomach. The weighted feeding tube terminates at the duodenal-jejunal junction. Aorta bi-iliac and SMA stents. Nonobstructive bowel gas pattern. Multilevel degenerative disc disease of the spine. IMPRESSION: Esophagogastric tube terminates in the stomach. Weighted feeding tube terminates at the duodenal-jejunal junction. Electronically Signed   By: Rance Burrows M.D.   On: 07/01/2023 14:23   DG Abd Portable 1V Result Date: 07/01/2023 CLINICAL DATA:  [id number] Encounter for feeding tube placement [id number] EXAM: PORTABLE ABDOMEN - 1 VIEW COMPARISON:  July 01, 2023 FINDINGS: Interval advancement of the weighted feeding tube, which crosses midline, and follows the expected course of the duodenum, now terminating in the region of the duodenal jejunal junction. Nonobstructive bowel gas pattern. Aorta bi-iliac stent graft. SMA stent. Cholecystectomy clips. Possible right-sided ostomy device. Patchy opacities in the right mid lung and left lung  base. Multilevel degenerative disc disease of the spine. IMPRESSION: 1. Continued advancement of the weighted feeding tube, which follows the expected course of the duodenum, now terminating in the region of the duodenal jejunal junction. 2. Patchy airspace opacities in the visualized lungs. Electronically Signed   By: Rance Burrows M.D.   On: 07/01/2023 14:22   DG Abd Portable 1V Result Date: 07/01/2023 CLINICAL DATA:  [id number] Encounter for feeding tube placement [id number] EXAM: PORTABLE ABDOMEN - 1 VIEW COMPARISON:  June 28, 2023 FINDINGS: Weighted  feeding tube crosses midline and may be coiled within the gastric antrum or the proximal duodenum. Nonobstructive bowel gas pattern. Aorta bi-iliac stent graft. Cholecystectomy clips. Possible right-sided ostomy device IMPRESSION: Weighted feeding tube crosses midline and may be within the gastric antrum or the proximal duodenum. Continued advancement recommended for optimal post pyloric positioning. Electronically Signed   By: Rance Burrows M.D.   On: 07/01/2023 13:54    Anti-infectives: Anti-infectives (From admission, onward)    Start     Dose/Rate Route Frequency Ordered Stop   06/30/23 0800  DAPTOmycin  (CUBICIN ) IVPB 500 mg/18mL premix        8 mg/kg  57 kg 100 mL/hr over 30 Minutes Intravenous Daily 06/29/23 1146     06/30/23 0300  vancomycin  (VANCOREADY) IVPB 500 mg/100 mL  Status:  Discontinued        500 mg 100 mL/hr over 60 Minutes Intravenous Every 24 hours 06/29/23 0054 06/29/23 1146   06/29/23 0200  vancomycin  (VANCOCIN ) 1,250 mg in sodium chloride  0.9 % 250 mL IVPB        1,250 mg 175 mL/hr over 90 Minutes Intravenous  Once 06/29/23 0054 06/29/23 0336   06/28/23 0930  piperacillin -tazobactam (ZOSYN ) IVPB 3.375 g  Status:  Discontinued        3.375 g 12.5 mL/hr over 240 Minutes Intravenous Every 12 hours 06/28/23 0832 06/28/23 0834   06/28/23 0930  piperacillin -tazobactam (ZOSYN ) IVPB 3.375 g        3.375 g 12.5 mL/hr over 240 Minutes Intravenous Every 8 hours 06/28/23 0834     06/24/23 2200  piperacillin -tazobactam (ZOSYN ) IVPB 3.375 g        3.375 g 12.5 mL/hr over 240 Minutes Intravenous Every 12 hours 06/24/23 1013 06/27/23 1621   06/20/23 1000  piperacillin -tazobactam (ZOSYN ) IVPB 3.375 g  Status:  Discontinued        3.375 g 12.5 mL/hr over 240 Minutes Intravenous Every 8 hours 06/20/23 0903 06/24/23 1013        Assessment/Plan POD 12 s/p dx laparoscopy by Dr. 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 Rosalva Comber on 06/20/23 Endoleak  noted 4/15 also w/ findings c/f aortic graft infection (hx AAA repair 2017) -> s/p priximal aoritc cuff and L renal stent by vascular.  POD 4 S/p ex lap, R colectomy, end ileostomy by Dr. 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  - Having ileostomy function and tolerated goal tube feeds. Advance oral diet per SLP. - BID WTD - WOCN consult - Remainder of care per primary team   LOS: 12 days    Lujean Sake, MD [facility] 07/02/2023, 1:08 PM Please see Amion for pager number during day hours 7:00am-4:30pm

## 2023-07-02 NOTE — Progress Notes (Signed)
 NAME:  Erik [name]., MRN:  [id number], DOB:  [birth year], LOS: 12 ADMISSION DATE:  06/19/2023, CONSULTATION DATE:  07/02/23 REFERRING MD:  Lyndon Santiago, CHIEF COMPLAINT:   hypotension  History of Present Illness:  83 y.o. M with PMH significant for CAD, HFrEF, collagen vascular disease, COPD, HL, HTN, ischemic cardiomyopathy who presented to the ED on 4/7 with abdominal pain.  His initial CT showed early SBO with stenosis of the celiac and SMA.  He was seen by vascular and underwent an SMA stent for acute mesenteric ischemia on 4/7, he continued to have abdominal pain and found to have an endoleak, this was discussed with general surgery and he was tken to the OR for an ex lap which revealed a necrotic R colon. R colectomy was done with ostomy formation.  Vascular surgery also placed a L renal artery and aortic artery stent.  He required pressors and was left intubated so PCCM consulted for admission  Pertinent  Medical History   has a past medical history of Adenomatous colon polyp, Aortic aneurysm (HCC), BPH (benign prostatic hypertrophy), CAD (coronary artery disease), Carotid artery stenosis, Cataract, CHF (congestive heart failure) (HCC), Chronic systolic heart failure (HCC), Collagen vascular disease (HCC), COPD (chronic obstructive pulmonary disease) (HCC), Coronary artery disease, Diverticulosis, GERD (gastroesophageal reflux disease), Heart murmur, History of colonic polyps, Hyperlipidemia, Hyperplastic colon polyp, Hypertension, Ischemic cardiomyopathy, and RLS (restless legs syndrome).   Significant Hospital Events: Including procedures, antibiotic start and stop dates in addition to other pertinent events   4/7 admit, SMA stent  4/15 worsening pain, endoleak, back to OR for ex lap, ostomy placement, blood cultures positive for MRSA and E. coli 4/16 remained intubated, on levophed , vasopressin  weaned off, CVL attempted right internal jugular but tracked into right subclavian vein and  removed. 4/17 TPN stopped in evening, PICC line removed 4/18 tolerated TF at goal, off pressors  Interim History / Subjective:   No acute events overnight Didn't sleep well  Tolerated TF at goal so far   Objective   Blood pressure 130/60, pulse 88, temperature 98.8 F (37.1 C), resp. rate 18, height 5\' 6"  (1.676 m), weight 59.2 kg, SpO2 93%.    Vent Mode: CPAP;PSV FiO2 (%):  [36 %-40 %] 36 % PEEP:  [5 cmH20] 5 cmH20 Pressure Support:  [5 cmH20-10 cmH20] 5 cmH20   Intake/Output Summary (Last 24 hours) at 07/02/2023 0741 Last data filed at 07/02/2023 0700 Gross per 24 hour  Intake 1413.35 ml  Output 4000 ml  Net -2586.65 ml   Filed Weights   06/30/23 0500 07/01/23 0500 07/02/23 0500  Weight: 58 kg 56.3 kg 59.2 kg    General:  chronically ill elderly male, no acute distress HEENT: dry mucous membranes, sclera anicteric Neuro: awake, alert, following commands CV: rrr, no murmurs PULM:  diminished breath sounds GI: cortrak in place, diffuse tenderness, midline incision dressed Extremities: warm/dry, trace edema  Skin: dry  Labs reviewed  Resolved Hospital Problem list   N/A  Assessment & Plan:  Ischemic colitis s/p right colectomy Shock state: combination septic/distributive, volume shifts, ABLA MRSA and E. Coli Bacteremia Aspiration pneumonitis, increased WOB- in part due to acidemia as well Postoperative vent management Acute kidney injury Mixed Metabolic/respiratory Acidosis Hypernatremia Severe protein calorie malnutrition PVD Anemia Thrombocytopenia CAD with prior PCI Prior AAA repair - concern for graft infection  - DC TPN - tube feeds at goal - free water  for hypernatremia - oxycodone  7.5mg  q6hrs per tube and PRN fentanyl  for pain - Usual  transfusion thresholds - Zosyn  + daptomycin  for bacteremia/intraabdominal coverage - aspirin  81mg  daily - heparin  dvt ppx - melatonin for sleep - gabapentin /ropinerole for restless legs - Guarded prognosis per  vascular surgery with concern for graft infection from bacteremia. Pending palliative care consult for care planning,   Best Practice (right click and "Reselect all SmartList Selections" daily)   Diet/type: tubefeeds DVT prophylaxis not indicated GI prophylaxis: PPI Lines: N/A Foley:  Yes, and it is still needed Code Status:  DNR Last date of multidisciplinary goals of care discussion Patient is now DNR with continuing current aggressive measures. Palliative care consult pending.  Labs   CBC: Recent Labs  Lab 06/28/23 1911 06/28/23 1942 06/29/23 0557 06/29/23 0906 06/30/23 0412 06/30/23 1117 07/01/23 0448 07/02/23 0643  WBC 9.6  --  7.1  --  12.3*  --  10.3 10.5  HGB 10.0*   < > 8.0* 8.2* 8.1* 8.2* 8.3* 8.7*  HCT 30.9*   < > 23.8* 24.0* 24.1* 24.0* 25.0* 27.2*  MCV 87.8  --  85.6  --  85.2  --  85.9 86.9  PLT 88*  84*  --  64*  --  87*  --  112* 150   < > = values in this interval not displayed.    Basic Metabolic Panel: Recent Labs  Lab 06/25/23 1640 06/26/23 0428 06/27/23 0515 06/27/23 1815 06/28/23 0554 06/28/23 1530 06/28/23 1911 06/28/23 1942 06/29/23 0557 06/29/23 0906 06/29/23 1638 06/30/23 0412 06/30/23 1117 06/30/23 1657 07/01/23 0448  NA 152*   < > 155*  --  150*   < > 150* 153* 146* 150*  --  149* 150*  --   --   K 3.0*   < > 4.2  --  4.4   < > 4.9 4.8 3.6 3.5  --  3.1* 3.5  --   --   CL 122*   < > 127*  --  125*  --  125*  --  118*  --   --  114*  --   --   --   CO2 25   < > 19*  --  17*  --  20*  --  21*  --   --  22  --   --   --   GLUCOSE 138*   < > 168*  --  176*  --  166*  --  179*  --   --  142*  --   --   --   BUN 72*   < > 55*  --  58*  --  62*  --  61*  --   --  65*  --   --   --   CREATININE 1.28*   < > 1.26*  --  1.37*  --  1.44*  --  1.39*  --   --  1.58*  --   --   --   CALCIUM  7.9*   < > 7.7*  --  7.8*  --  6.9*  --  6.6*  --   --  7.0*  --   --   --   MG 2.0   < > 2.5*   < > 2.3  --  2.2  --  2.1  --  2.3 2.4  --  2.5* 2.7*  PHOS  3.6  --  2.5  --  2.8  --   --   --  3.9  --   --  4.0  --   --   --    < > =  values in this interval not displayed.   GFR: Estimated Creatinine Clearance: 30.2 mL/min (A) (by C-G formula based on SCr of 1.58 mg/dL (H)). Recent Labs  Lab 06/28/23 0916 06/28/23 1911 06/28/23 2214 06/29/23 0557 06/30/23 0412 07/01/23 0448 07/02/23 0643  PROCALCITON 7.28  --   --   --   --   --   --   WBC  --  9.6  --  7.1 12.3* 10.3 10.5  LATICACIDVEN 1.4 1.0 1.3  --   --   --   --     Liver Function Tests: Recent Labs  Lab 06/25/23 1640 06/27/23 0515 06/30/23 0412  AST  --  43* 24  ALT  --  80* 27  ALKPHOS  --  41 50  BILITOT  --  1.1 1.0  PROT  --  4.8* 5.1*  ALBUMIN  1.5* <1.5* 2.0*   No results for input(s): "LIPASE", "AMYLASE" in the last 168 hours. No results for input(s): "AMMONIA" in the last 168 hours.  ABG    Component Value Date/Time   PHART 7.398 06/30/2023 1117   PCO2ART 36.7 06/30/2023 1117   PO2ART 123 (H) 06/30/2023 1117   HCO3 22.6 06/30/2023 1117   TCO2 24 06/30/2023 1117   ACIDBASEDEF 2.0 06/30/2023 1117   O2SAT 99 06/30/2023 1117     Coagulation Profile: Recent Labs  Lab 06/28/23 1911  INR 1.4*    Cardiac Enzymes: Recent Labs  Lab 06/30/23 0412  CKTOTAL 176    HbA1C: Hgb A1c MFr Bld  Date/Time Value Ref Range Status  06/23/2023 10:55 AM 5.6 4.8 - 5.6 % Final    Comment:    (NOTE)         Prediabetes: 5.7 - 6.4         Diabetes: >6.4         Glycemic control for adults with diabetes: <7.0   08/19/2022 08:19 AM 4.5 (L) 4.6 - 6.5 % Final    Comment:    Glycemic Control Guidelines for People with Diabetes:Non Diabetic:  <6%Goal of Therapy: <7%Additional Action Suggested:  >8%     CBG: Recent Labs  Lab 07/01/23 1132 07/01/23 1515 07/01/23 2011 07/02/23 0004 07/02/23 0412  GLUCAP 106* 116* 153* 114* 129*     Critical care time: 32 minutes    Duaine German, MD [facility] Office: [phone]   See Amion  for personal pager PCCM on call pager [phone] until 7pm. Please call Elink 7p-7a. [phone]

## 2023-07-03 LAB — GLUCOSE, CAPILLARY
Glucose-Capillary: 110 mg/dL — ABNORMAL HIGH (ref 70–99)
Glucose-Capillary: 117 mg/dL — ABNORMAL HIGH (ref 70–99)
Glucose-Capillary: 118 mg/dL — ABNORMAL HIGH (ref 70–99)
Glucose-Capillary: 127 mg/dL — ABNORMAL HIGH (ref 70–99)

## 2023-07-03 LAB — BASIC METABOLIC PANEL WITH GFR
Anion gap: 8 (ref 5–15)
BUN: 59 mg/dL — ABNORMAL HIGH (ref 8–23)
CO2: 23 mmol/L (ref 22–32)
Calcium: 7.8 mg/dL — ABNORMAL LOW (ref 8.9–10.3)
Chloride: 125 mmol/L — ABNORMAL HIGH (ref 98–111)
Creatinine, Ser: 1.61 mg/dL — ABNORMAL HIGH (ref 0.61–1.24)
GFR, Estimated: 42 mL/min — ABNORMAL LOW (ref >60)
Glucose, Bld: 127 mg/dL — ABNORMAL HIGH (ref 70–99)
Potassium: 4.7 mmol/L (ref 3.5–5.1)
Sodium: 156 mmol/L — ABNORMAL HIGH (ref 135–145)

## 2023-07-03 LAB — CBC
HCT: 28.9 % — ABNORMAL LOW (ref 39.0–52.0)
Hemoglobin: 9.3 g/dL — ABNORMAL LOW (ref 13.0–17.0)
MCH: 28.6 pg (ref 26.0–34.0)
MCHC: 32.2 g/dL (ref 30.0–36.0)
MCV: 88.9 fL (ref 80.0–100.0)
Platelets: 163 10*3/uL (ref 150–400)
RBC: 3.25 MIL/uL — ABNORMAL LOW (ref 4.22–5.81)
RDW: 18.6 % — ABNORMAL HIGH (ref 11.5–15.5)
WBC: 10.8 10*3/uL — ABNORMAL HIGH (ref 4.0–10.5)
nRBC: 0 % (ref 0.0–0.2)

## 2023-07-03 MED ORDER — METHOCARBAMOL 1000 MG/10ML IJ SOLN
500.0000 mg | Freq: Three times a day (TID) | INTRAMUSCULAR | Status: DC | PRN
Start: 2023-07-03 — End: 2023-07-03
  Administered 2023-07-03: 500 mg via INTRAVENOUS
  Filled 2023-07-03: qty 10

## 2023-07-03 MED ORDER — REVEFENACIN 175 MCG/3ML IN SOLN: 175.0000 ug | Freq: Every day | RESPIRATORY_TRACT | Status: DC

## 2023-07-03 MED ORDER — BUDESONIDE 0.25 MG/2ML IN SUSP: 0.2500 mg | Freq: Two times a day (BID) | RESPIRATORY_TRACT | Status: DC

## 2023-07-03 MED ORDER — LORAZEPAM 2 MG/ML IJ SOLN
0.5000 mg | INTRAMUSCULAR | Status: DC | PRN
  Administered 2023-07-03: 1 mg via INTRAVENOUS
  Filled 2023-07-03: qty 1

## 2023-07-03 MED ORDER — METHOCARBAMOL 1000 MG/10ML IJ SOLN: 500.0000 mg | Freq: Three times a day (TID) | INTRAMUSCULAR | Status: DC | PRN

## 2023-07-03 MED ORDER — POLYVINYL ALCOHOL 1.4 % OP SOLN: 1.0000 [drp] | Freq: Four times a day (QID) | OPHTHALMIC | Status: DC | PRN

## 2023-07-03 MED ORDER — GLYCOPYRROLATE 1 MG PO TABS: 1.0000 mg | ORAL_TABLET | ORAL | Status: DC | PRN

## 2023-07-03 MED ORDER — ARFORMOTEROL TARTRATE 15 MCG/2ML IN NEBU: 15.0000 ug | INHALATION_SOLUTION | Freq: Two times a day (BID) | RESPIRATORY_TRACT | Status: DC

## 2023-07-03 MED ORDER — ONDANSETRON HCL 4 MG/2ML IJ SOLN: 4.0000 mg | Freq: Four times a day (QID) | INTRAMUSCULAR | Status: DC | PRN

## 2023-07-03 MED ORDER — GLYCOPYRROLATE 0.2 MG/ML IJ SOLN: 0.2000 mg | INTRAMUSCULAR | Status: DC | PRN

## 2023-07-03 MED ORDER — LORAZEPAM 2 MG/ML IJ SOLN
0.5000 mg | INTRAMUSCULAR | Status: DC | PRN
Start: 2023-07-03 — End: 2023-07-26

## 2023-07-03 MED ORDER — FENTANYL CITRATE PF 50 MCG/ML IJ SOSY: 100.0000 ug | PREFILLED_SYRINGE | INTRAMUSCULAR | Status: DC | PRN

## 2023-07-03 MED ORDER — BIOTENE DRY MOUTH MT LIQD: 15.0000 mL | OROMUCOSAL | Status: DC | PRN

## 2023-07-03 MED ORDER — FENTANYL CITRATE PF 50 MCG/ML IJ SOSY
50.0000 ug | PREFILLED_SYRINGE | Freq: Four times a day (QID) | INTRAMUSCULAR | Status: DC
  Administered 2023-07-03: 50 ug via INTRAVENOUS
  Filled 2023-07-03: qty 1

## 2023-07-03 MED ORDER — FENTANYL CITRATE PF 50 MCG/ML IJ SOSY: 50.0000 ug | PREFILLED_SYRINGE | Freq: Four times a day (QID) | INTRAMUSCULAR | Status: DC

## 2023-07-03 MED ORDER — GERHARDT'S BUTT CREAM: 1.0000 | TOPICAL_CREAM | CUTANEOUS | Status: DC | PRN

## 2023-07-03 MED ORDER — GLYCOPYRROLATE 0.2 MG/ML IJ SOLN
0.2000 mg | INTRAMUSCULAR | Status: DC | PRN
  Administered 2023-07-03: 0.2 mg via INTRAVENOUS
  Filled 2023-07-03: qty 1

## 2023-07-03 NOTE — Progress Notes (Addendum)
 Palliative Medicine Inpatient Follow Up Note   HPI: 83 y.o. male  with past medical history of CAD, HFrEF, collagen vascular disease, COPD, HLD, HTN, and ischemic cardiomyopathy admitted on 06/19/2023 with abdominal pain. His initial CT showed early SBO with stenosis of the celiac and SMA. He was seen by vascular and underwent an SMA stent for acute mesenteric ischemia on 4/7, he continued to have abdominal pain and found to have an endoleak, this was discussed with general surgery and he was tken to the OR for an ex lap which revealed a necrotic R colon. R colectomy was done with ostomy formation. Vascular surgery also placed a L renal artery and aortic artery stent. He required pressors and ventilator support post op. Extubated 4/18 and off pressors.  Vascular surgery concerned with graft infection from bacteremia and patient is not a candidate for graft explant.  Vascular surgery has recommended palliative approach.  PMT consulted by critical care team.   Today's Discussion 07/03/2023  *Please note that this is a verbal dictation therefore any spelling or grammatical errors are due to the "[employer]agon [employer] One" system interpretation.  Chart reviewed inclusive of vital signs, progress notes, laboratory results, and diagnostic images.   I met at bedside with Geovani and his oldest daughter. We discussed his present clinical condition in the setting of his mesenteric ischemia. We reviewed his poor long term prognosis. Patients daughter shares that her mother had just left and is reachable by phone for further conversations.  I was able to get [facility] two ice packs and ice chips per request.   I called patients wife, [name] over the phone. Created space and opportunity for [name] to explore her thoughts feelings and fears regarding [place] current medical situation. We talked about transition to comfort measures in house and what that would entail inclusive of medications to control pain, dyspnea,  agitation, nausea, itching, and hiccups.  We discussed stopping all uneccessary measures such as cardiac monitoring, blood draws, needle sticks, and frequent vital signs.   Patients wife shares agreement with focusing on comfort care. She asks that his medication for spasms is not discontinued. I share that we would continue this presently.   Patient wife is interested in transitioning him to [facility].   Utilized reflective listening throughout our time together.  _________________________ Addendum:  I spoke to nursing staff and patients daughter this afternoon.  Coretrack will be removed for comfort.   Questions and concerns addressed/Palliative Support Provided.   Objective Assessment: Vital Signs Vitals:   07/03/23 1100 07/03/23 1200  BP:    Pulse: 88 87  Resp: 15 13  Temp: 99 F (37.2 C) 98.8 F (37.1 C)  SpO2: 91% 93%    Intake/Output Summary (Last 24 hours) at 07/03/2023 1324 Last data filed at 07/03/2023 0800 Gross per 24 hour  Intake 2025.04 ml  Output 2535 ml  Net -509.96 ml   Last Weight  Most recent update: 07/03/2023  5:02 AM    Weight  56.5 kg (124 lb 9 oz)            Gen:  Frail elderly M in NAD HEENT: Coretrack, dry mucous membranes CV: Regular rate and rhythm  PULM: On 2LPM [place], breathing is even and nonlabored ABD: Colostomy, (+) midabdominal bandaging EXT: No edema  Neuro: Alert and oriented x1  SUMMARY OF RECOMMENDATIONS   DNAR/DNI  Comfort care  DC orals and transition to IV  Will need fentanyl  as needed and ATC  Will DC  coretrack prior to transfer  Plan for [facility] [place] Transition once a bed is available  Ongoing PMT support  Time Spent: 65 Billing based on MDM: High ______________________________________________________________________________________ [facility] Team Team Cell Phone: [phone] Please utilize secure chat with additional questions, if there  is no response within 30 minutes please call the above phone number  Palliative Medicine Team providers are available by phone from 7am to 7pm daily and can be reached through the team cell phone.  Should this patient require assistance outside of these hours, please call the patient's attending physician.

## 2023-07-03 NOTE — TOC Transition Note (Addendum)
 Transition of Care [facility]) - Discharge Note   Patient Details  Name: Erik [name]. MRN: [id number] Date of Birth: Sep 13, 1940  Transition of Care [facility]) CM/SW Contact:  Jeffory Mings, [place] Phone Number: 07/03/2023, 3:37 PM   Clinical Narrative: Palliative Care APP made referral to [facility]) for [facility] per family request. Per Peterson Brandt with [facility], pt's wife is agreeable to dc plan and has completed admission consents. RN provided with number for report and PTAR arranged for transport. SW signing off at dc.     Final next level of care: Hospice Medical Facility Barriers to Discharge: Barriers Resolved   Patient Goals and CMS Choice Patient states their goals for this hospitalization and ongoing recovery are:: rerturn home          Discharge Placement              Patient chooses bed at: Other - please specify in the comment section below: [facility]) Patient to be transferred to facility by: PTAR Name of family member notified: Carolyn/wife Patient and family notified of of transfer: 07/03/23  Discharge Plan and Services Additional resources added to the After Visit Summary for                                       Social Drivers of Health (SDOH) Interventions SDOH Screenings   Food Insecurity: No Food Insecurity (06/20/2023)  Housing: Low Risk  (06/20/2023)  Transportation Needs: No Transportation Needs (06/20/2023)  Utilities: Not At Risk (06/20/2023)  Alcohol  Screen: Low Risk  (05/31/2019)  Depression (PHQ2-9): Low Risk  (09/27/2022)  Financial Resource Strain: Low Risk  (04/29/2021)  Physical Activity: Inactive (05/31/2019)  Stress: No Stress Concern Present (05/31/2019)  Tobacco Use: High Risk (06/28/2023)     Readmission Risk Interventions    02/17/2022    4:21 PM  Readmission Risk Prevention Plan  Transportation Screening Complete  PCP or Specialist Appt within 3-5 Days Complete  HRI or Home Care  Consult Complete  Social Work Consult for Recovery Care Planning/Counseling Complete  Palliative Care Screening Complete  Medication Review [employer]) Complete

## 2023-07-03 NOTE — Discharge Summary (Signed)
 Physician Discharge Summary   Patient: Erik [name]. MRN: [id number] DOB: [birth year]  Admit date:     06/19/2023  Discharge date: 07/03/23  Discharge Physician: Cherylle Corwin   PCP: Judithann Novas, MD   Recommendations at discharge:    Follow up with Hospice services as needed  Discharge Diagnoses: Principal Problem:   Polymicrobial bacterial infection Active Problems:   Family history of coronary artery disease in brother   MSSA bacteremia   CAD S/P percutaneous coronary angioplasty   COPD, mild (HCC)   Hyperlipidemia   Bilateral carotid artery stenosis   BPH (benign prostatic hyperplasia)   Chronic systolic heart failure (HCC)   HTN (hypertension)   BPH with obstruction/lower urinary tract symptoms   Abdominal aortic aneurysm (AAA) (HCC)   Restless leg syndrome   Abdominal pain   Anemia   SBO (small bowel obstruction) (HCC)   S/P exploratory laparotomy   E coli bacteremia   PICC line infection   Small bowel obstruction (HCC)   MRSA bacteremia  Resolved Problems:   * No resolved hospital problems. [facility] Course: 83yo with hx CAD, HFrEF, COPD, collagen vascular disease, HLD, HTN, ischemic cardiomyopathy who presented to ED initially with abd pains, found ot have SBO with stenosis of celiac and SMA, s/p SMA stent and later found to have endoleak. Pt later found to have necrotic R colon s/p R colectomy as well as L renal artery and aortic artery stent. PCCM was following for pressor support and for airway on ventilator. Pt later extubated and weaned off pressors. Vascular surgery was later concerned about graft infection with recs for Palliative Care. Palliative Care was consulted and on 4/20, patient was transitioned to hospice with full comfort.  Assessment and Plan: Ischemic colitis s/p R colectomy -Was seen by general surgery and is s/p R colectomy and end ileostomy on 4/15 -was on tube feed with broad spectrum abx -Pt s/p superior mesenteric artery stent  4/7 and prox aortic cuff and L renal stent per Vascular -Palliative approach was recommended by Vascular Surgery given concerns of graft infection in the setting of bacteremia -Palliative Care is following. Decision has been made for transition to full comfort with plan for residential hospice   Septic and hypovolemic shock -Initially required pressor support in  ICU, later weaned off -Later transition to full comfort    Aspiration pneumonitis -Pt had been on broad abx, later discontinued  given transition to comfort status   ARF -later transitioned to comfort measues   Hypernatremia -Noted to have been worsening -Decision was made later to transition to comfort care. Plan for residential hospice   Severe protein calorie malnutrition -later transition to full comfort measures   CAD -home meds were later stopped as pt was transitioned to comfort measures   Hx AAA repair with concerns of graft infection -initially on broad spectrum antibiotics -Patient later transitioned to full comfort. Palliative Care following   Hypoalbuminemia  DNR  End of life care      Consultants: General Surgery, Vascular Surgery, Palliative Care Procedures performed: s/p R colectomy and end ileostomy on 4/15, s/p superior mesenteric artery stent 4/7 and prox aortic cuff and L renal stent per Vascular    Disposition: Hospice care Diet recommendation:  Regular diet, comfort feed as tolerated DISCHARGE MEDICATION: Allergies as of 07/03/2023       Reactions   Codeine  Anaphylaxis, Swelling   throat swells   Heparin  Other (See Comments)   R/O HIT; Patient's wife stated  that heparin  was not an allergy  of the patient; She is unaware of a heparin  allergy         Medication List     STOP taking these medications    acetaminophen  500 MG tablet Commonly known as: TYLENOL    aspirin  EC 81 MG tablet   clopidogrel  75 MG tablet Commonly known as: PLAVIX    cyanocobalamin  1000 MCG tablet Commonly  known as: VITAMIN B12   ezetimibe -simvastatin  10-40 MG tablet Commonly known as: VYTORIN    famotidine  40 MG tablet Commonly known as: PEPCID    Farxiga  10 MG Tabs tablet Generic drug: dapagliflozin  propanediol   ferrous sulfate  325 (65 FE) MG tablet   Fish Oil 1000 MG Caps   gabapentin  100 MG capsule Commonly known as: NEURONTIN    methocarbamol  500 MG tablet Commonly known as: ROBAXIN  Replaced by: methocarbamol  1000 MG/10ML injection   metoprolol  succinate 50 MG 24 hr tablet Commonly known as: TOPROL -XL   mirtazapine  15 MG tablet Commonly known as: REMERON    rOPINIRole  3 MG tablet Commonly known as: REQUIP    spironolactone  25 MG tablet Commonly known as: ALDACTONE    tamsulosin  0.4 MG Caps capsule Commonly known as: FLOMAX        TAKE these medications    antiseptic oral rinse Liqd Apply 15 mLs topically as needed for dry mouth.   arformoterol  15 MCG/2ML Nebu Commonly known as: BROVANA  Take 2 mLs (15 mcg total) by nebulization 2 (two) times daily.   budesonide  0.25 MG/2ML nebulizer solution Commonly known as: PULMICORT  Take 2 mLs (0.25 mg total) by nebulization 2 (two) times daily.   fentaNYL  50 MCG/ML injection Commonly known as: SUBLIMAZE  Inject 2 mLs (100 mcg total) into the vein every hour as needed (Pain/Dyspnea).   fentaNYL  50 MCG/ML injection Commonly known as: SUBLIMAZE  Inject 1 mL (50 mcg total) into the vein every 6 (six) hours.   Gerhardt's butt cream Crea Apply 1 Application topically as needed for irritation.   glycopyrrolate  1 MG tablet Commonly known as: ROBINUL  Take 1 tablet (1 mg total) by mouth every 4 (four) hours as needed (excessive secretions).   LORazepam  2 MG/ML injection Commonly known as: ATIVAN  Inject 0.25-0.5 mLs (0.5-1 mg total) into the vein every hour as needed for anxiety, seizure or sedation (insomnia).   methocarbamol  1000 MG/10ML injection Commonly known as: ROBAXIN  Inject 5 mLs (500 mg total) into the vein  every 8 (eight) hours as needed. Replaces: methocarbamol  500 MG tablet   ondansetron  4 MG/2ML Soln injection Commonly known as: ZOFRAN  Inject 2 mLs (4 mg total) into the vein every 6 (six) hours as needed for nausea or vomiting.   polyvinyl alcohol  1.4 % ophthalmic solution Commonly known as: LIQUIFILM TEARS Place 1 drop into both eyes 4 (four) times daily as needed for dry eyes.   revefenacin  175 MCG/3ML nebulizer solution Commonly known as: YUPELRI  Take 3 mLs (175 mcg total) by nebulization daily. Start taking on: July 04, 2023        Discharge Exam: Erik [name] Weights   07/01/23 0500 07/02/23 0500 07/03/23 0500  Weight: 56.3 kg 59.2 kg 56.5 kg   General exam: laying in bed, in nad Respiratory system: shallow respiratory effort, no wheezing Cardiovascular system: regular rate, s1, s2 Gastrointestinal system: Soft, nondistended, positive BS Central nervous system: no seizures, no tremors Extremities: Perfused, no clubbing Skin: Normal skin turgor, no notable skin lesions seen Psychiatry: unable to assess given mentation   Condition at discharge: Terminal  The results of significant diagnostics from this hospitalization (including imaging, microbiology,  ancillary and laboratory) are listed below for reference.   Imaging Studies: DG Abd Portable 1V Result Date: 07/01/2023 CLINICAL DATA:  [id number] Encounter for feeding tube placement [id number] EXAM: PORTABLE ABDOMEN - 1 VIEW COMPARISON:  Multiple, 07/01/2023 FINDINGS: Esophagogastric tube terminates in the region of the stomach. The weighted feeding tube terminates at the duodenal-jejunal junction. Aorta bi-iliac and SMA stents. Nonobstructive bowel gas pattern. Multilevel degenerative disc disease of the spine. IMPRESSION: Esophagogastric tube terminates in the stomach. Weighted feeding tube terminates at the duodenal-jejunal junction. Electronically Signed   By: Rance Burrows M.D.   On: 07/01/2023 14:23   DG Abd Portable 1V Result  Date: 07/01/2023 CLINICAL DATA:  [id number] Encounter for feeding tube placement [id number] EXAM: PORTABLE ABDOMEN - 1 VIEW COMPARISON:  July 01, 2023 FINDINGS: Interval advancement of the weighted feeding tube, which crosses midline, and follows the expected course of the duodenum, now terminating in the region of the duodenal jejunal junction. Nonobstructive bowel gas pattern. Aorta bi-iliac stent graft. SMA stent. Cholecystectomy clips. Possible right-sided ostomy device. Patchy opacities in the right mid lung and left lung base. Multilevel degenerative disc disease of the spine. IMPRESSION: 1. Continued advancement of the weighted feeding tube, which follows the expected course of the duodenum, now terminating in the region of the duodenal jejunal junction. 2. Patchy airspace opacities in the visualized lungs. Electronically Signed   By: Rance Burrows M.D.   On: 07/01/2023 14:22   DG Abd Portable 1V Result Date: 07/01/2023 CLINICAL DATA:  [id number] Encounter for feeding tube placement [id number] EXAM: PORTABLE ABDOMEN - 1 VIEW COMPARISON:  June 28, 2023 FINDINGS: Weighted feeding tube crosses midline and may be coiled within the gastric antrum or the proximal duodenum. Nonobstructive bowel gas pattern. Aorta bi-iliac stent graft. Cholecystectomy clips. Possible right-sided ostomy device IMPRESSION: Weighted feeding tube crosses midline and may be within the gastric antrum or the proximal duodenum. Continued advancement recommended for optimal post pyloric positioning. Electronically Signed   By: Rance Burrows M.D.   On: 07/01/2023 13:54   DG CHEST PORT 1 VIEW Result Date: 06/29/2023 CLINICAL DATA:  Respiratory failure, central venous catheter placement EXAM: PORTABLE CHEST 1 VIEW COMPARISON:  None Available. FINDINGS: Right subclavian central venous catheter has been placed with its tip extending superiorly into the expected jugular vein beyond the superior margin of the examination. Endotracheal tube, nasoenteric  feeding tube, and right upper extremity PICC line are unchanged. Pulmonary insufflation is stable. Superimposed asymmetric perihilar and upper lung zone airspace infiltrates appear slightly progressive since prior examination, in keeping with infection or noncardiogenic pulmonary edema. No pneumothorax or pleural effusion. Cardiac size is within normal limits. Abdominal aortic stent graft is partially visualized. IMPRESSION: 1. Interval placement of right subclavian central venous catheter with its tip extending superiorly into the expected jugular vein beyond the superior margin of the examination. Repositioning is recommended. 2. Stable support lines and tubes. 3. Progressive asymmetric perihilar and upper lung zone airspace infiltrates, in keeping with infection or noncardiogenic pulmonary edema. These results will be called to the ordering clinician or representative by the Radiologist Assistant, and communication documented in the PACS or [employer]. Electronically Signed   By: Worthy Heads M.D.   On: 06/29/2023 20:22   ECHOCARDIOGRAM COMPLETE Result Date: 06/29/2023    ECHOCARDIOGRAM REPORT   Patient Name:   Erik [name]. Date of Exam: 06/29/2023 Medical Rec #:  [id number]             Height:  66.0 in Accession #:    [id number]            Weight:       125.7 lb Date of Birth:  [birth year]              BSA:          1.641 m Patient Age:    83 years              BP:           145/55 mmHg Patient Gender: M                     HR:           60 bpm. Exam Location:  Inpatient Procedure: 2D Echo, Cardiac Doppler and Color Doppler (Both Spectral and Color            Flow Doppler were utilized during procedure). Indications:    Endocarditis I38  History:        Patient has prior history of Echocardiogram examinations, most                 recent 06/24/2023. Cardiomyopathy, Previous Myocardial Infarction                 and CAD, COPD, Endocarditis, Signs/Symptoms:Murmur; Risk                  Factors:Hypertension and Dyslipidemia.  Sonographer:    Terrilee Few RCS Referring Phys: ADITYA PALIWAL IMPRESSIONS  1. Left ventricular ejection fraction, by estimation, is 50 to 55%. The left ventricle has low normal function. The left ventricle has no regional wall motion abnormalities. Left ventricular diastolic parameters are consistent with Grade II diastolic dysfunction (pseudonormalization).  2. Right ventricular systolic function is normal. The right ventricular size is normal.  3. Left atrial size was moderately dilated.  4. RA mass seen in images likely represents prominent eustachian valve. Right atrial size was mildly dilated.  5. Restricted posterior leaflet with at least moderate MR. Appears worsened since last echo. The mitral valve is degenerative. Moderate mitral valve regurgitation. Mild mitral stenosis. The mean mitral valve gradient is 6.0 mmHg. Severe mitral annular calcification.  6. Tricuspid valve regurgitation is mild to moderate.  7. The aortic valve is calcified. Aortic valve regurgitation is severe. No aortic stenosis is present.  8. The inferior vena cava is normal in size with greater than 50% respiratory variability, suggesting right atrial pressure of 3 mmHg. FINDINGS  Left Ventricle: Left ventricular ejection fraction, by estimation, is 50 to 55%. The left ventricle has low normal function. The left ventricle has no regional wall motion abnormalities. The left ventricular internal cavity size was normal in size. There is no left ventricular hypertrophy. Left ventricular diastolic parameters are consistent with Grade II diastolic dysfunction (pseudonormalization). Right Ventricle: The right ventricular size is normal. No increase in right ventricular wall thickness. Right ventricular systolic function is normal. Left Atrium: Left atrial size was moderately dilated. Right Atrium: RA mass seen in images likely represents prominent eustachian valve. Right atrial size was mildly  dilated. Prominent Eustachian valve. Pericardium: There is no evidence of pericardial effusion. Mitral Valve: Restricted posterior leaflet with at least moderate MR. Appears worsened since last echo. The mitral valve is degenerative in appearance. Severe mitral annular calcification. Moderate mitral valve regurgitation. Mild mitral valve stenosis. MV peak gradient, 13.8 mmHg. The mean mitral valve gradient is 6.0 mmHg. There is no evidence of mitral valve vegetation.  Tricuspid Valve: The tricuspid valve is normal in structure. Tricuspid valve regurgitation is mild to moderate. No evidence of tricuspid stenosis. There is no evidence of tricuspid valve vegetation. Aortic Valve: The aortic valve is calcified. Aortic valve regurgitation is severe. No aortic stenosis is present. Aortic valve mean gradient measures 9.5 mmHg. Aortic valve peak gradient measures 18.5 mmHg. Aortic valve area, by VTI measures 1.30 cm. Pulmonic Valve: The pulmonic valve was normal in structure. Pulmonic valve regurgitation is not visualized. No evidence of pulmonic stenosis. Aorta: The aortic root is normal in size and structure. Venous: The inferior vena cava is normal in size with greater than 50% respiratory variability, suggesting right atrial pressure of 3 mmHg. IAS/Shunts: No atrial level shunt detected by color flow Doppler.  LEFT VENTRICLE PLAX 2D LVIDd:         4.70 cm   Diastology LVIDs:         3.40 cm   LV e' medial:    5.87 cm/s LV PW:         1.00 cm   LV E/e' medial:  27.8 LV IVS:        1.20 cm   LV e' lateral:   9.36 cm/s LVOT diam:     1.80 cm   LV E/e' lateral: 17.4 LV SV:         66 LV SV Index:   40 LVOT Area:     2.54 cm  RIGHT VENTRICLE             IVC RV S prime:     15.10 cm/s  IVC diam: 2.80 cm TAPSE (M-mode): 2.6 cm LEFT ATRIUM             Index        RIGHT ATRIUM           Index LA diam:        4.60 cm 2.80 cm/m   RA Area:     15.10 cm LA Vol (A2C):   93.7 ml 57.09 ml/m  RA Volume:   36.30 ml  22.12 ml/m LA  Vol (A4C):   84.7 ml 51.60 ml/m LA Biplane Vol: 89.4 ml 54.47 ml/m  AORTIC VALVE AV Area (Vmax):    1.37 cm AV Area (Vmean):   1.40 cm AV Area (VTI):     1.30 cm AV Vmax:           215.00 cm/s AV Vmean:          139.500 cm/s AV VTI:            0.506 m AV Peak Grad:      18.5 mmHg AV Mean Grad:      9.5 mmHg LVOT Vmax:         115.67 cm/s LVOT Vmean:        76.733 cm/s LVOT VTI:          0.259 m LVOT/AV VTI ratio: 0.51  AORTA Ao Root diam: 3.00 cm Ao Asc diam:  3.20 cm MITRAL VALVE MV Area (PHT): 2.30 cm     SHUNTS MV Area VTI:   1.00 cm     Systemic VTI:  0.26 m MV Peak grad:  13.8 mmHg    Systemic Diam: 1.80 cm MV Mean grad:  6.0 mmHg MV Vmax:       1.86 m/s MV Vmean:      116.5 cm/s MV Decel Time: 330 msec MV E velocity: 163.00 cm/s MV A velocity: 161.00 cm/s MV  E/A ratio:  1.01 Arta Lark Electronically signed by Arta Lark Signature Date/Time: 06/29/2023/4:22:17 PM    Final    DG Chest Port 1 View Result Date: 06/28/2023 CLINICAL DATA:  [id number].  Pulmonary edema follow-up. EXAM: PORTABLE CHEST 1 VIEW COMPARISON:  Portable chest yesterday at 9:24 a.m. FINDINGS: 7:28 p.m. ETT interval insertion with tip 6.6 cm from the carina. Dobbhoff feeding tube is coiled in the stomach as before. Interval NGT insertion with the tip in the proximal gastric antrum. Right PICC again terminating at the superior cavoatrial junction. The heart is slightly enlarged. There is improvement in central vascular and interstitial prominence. There is interval clearing in the lower lung fields but there is still interstitial and heterogeneous patchy airspace disease in the upper lobes consistent with pneumonia, edema or combination. The aorta is tortuous and calcified with stable mediastinum. No new osseous findings. Multiple overlying monitor wires. Aortic endograft upper abdomen. IMPRESSION: 1. Interval ETT insertion with tip 6.6 cm from the carina. 2. Interval NGT insertion with tip in the proximal gastric antrum. 3.  Interval clearing in the lower lung fields but there is still interstitial and heterogeneous patchy airspace disease in the upper lobes consistent with pneumonia, edema or combination. 4. Improved central vascular and interstitial prominence. 5. Aortic atherosclerosis. Electronically Signed   By: Denman Fischer M.D.   On: 06/28/2023 20:54   CT Angio Abd/Pel w/ and/or w/o Result Date: 06/28/2023 CLINICAL DATA:  Postop day 8 from SMA stent placement. Increased abdominal pain with leukocytosis and fever. EXAM: CTA ABDOMEN AND PELVIS WITHOUT AND WITH CONTRAST TECHNIQUE: Multidetector CT imaging of the abdomen and pelvis was performed using the standard protocol during bolus administration of intravenous contrast. Multiplanar reconstructed images and MIPs were obtained and reviewed to evaluate the vascular anatomy. RADIATION DOSE REDUCTION: This exam was performed according to the departmental dose-optimization program which includes automated exposure control, adjustment of the mA and/or kV according to patient size and/or use of iterative reconstruction technique. CONTRAST:  75mL OMNIPAQUE  IOHEXOL  350 MG/ML SOLN COMPARISON:  Abdominopelvic CT 06/19/2023 and 06/09/2023 FINDINGS: VASCULAR Aorta: Status post endograft stent repair of an abdominal aortic aneurysm with stent graft extending from the distal thoracic aorta into the iliac arteries. Pre-existing short renal artery stents bilaterally. New superior mesenteric artery stent, further described below. Interval significant enlargement of the excluded aneurysm sac, especially anteriorly, now measuring 6.4 x 6.1 cm on image 79/6 (previously 5.6 x 4.5 cm), extending from the diaphragmatic hiatus to the iliac bifurcation. Pre contrast images demonstrate increased density within the excluded aneurysm sac with anterior extension beyond the previously demonstrated intimal calcifications inferior to the renal arteries. Post-contrast, there are new contrast collections  peripheral to the stent graft measuring up to 2.4 x 1.4 x 1.9 cm with anterior extension beyond the inferior and posterior to the right renal artery stent. There is additional smaller collection anteriorly, just inferior to the SMA origin. There are no contrast-enhancing collections beyond the aortic intima. The stent grafts are patent. No extraluminal gas collection identified within the excluded aneurysm sac or retroperitoneum. Celiac: Stable high-grade focal stenosis just past the origin of the celiac trunk. Distal patency without additional significant stenosis. SMA: Interval stenting of the proximal superior mesenteric artery. The stent is patent. No immediate surrounding endograft leak. No distal stenosis. Renals: Patent renal arteries bilaterally status post previous bilateral renal artery stenting. IMA: Chronically occluded. Inflow: Patent common iliac artery stents. External and internal iliac atherosclerosis without significant stenosis. Veins: No obvious venous abnormality  within the limitations of this arterial phase study. Review of the MIP images confirms the above findings. NON-VASCULAR FINDINGS Lower chest: New right greater than left pleural effusions with associated bibasilar atelectasis and possible edema. Hepatobiliary: The liver is normal in density without suspicious focal abnormality. Prior cholecystectomy. No significant biliary dilatation. Pancreas: Unremarkable. No pancreatic ductal dilatation or surrounding inflammatory changes. Spleen: Normal in size without focal abnormality. Adrenals/Urinary Tract: Both adrenal glands appear normal. The kidneys appear normal without evidence of urinary tract calculus, suspicious lesion or hydronephrosis. Renal vascular calcifications bilaterally. There is air within the urinary bladder, likely iatrogenic. No focal bladder abnormality identified. Stomach/Bowel: No enteric contrast administered. Feeding tube projects into the proximal jejunum. There are  fluid-filled loops of small and large bowel without significant residual bowel distension or focal wall thickening. No pneumatosis. A rectal tube is in place. Lymphatic: There are no enlarged abdominal or pelvic lymph nodes. Reproductive: Stable moderate enlargement of the prostate gland. Other: Interval increased generalized subcutaneous edema. Small amount of ascites. No evidence of hemoperitoneum or pneumoperitoneum. The abdominal wall appears intact. Musculoskeletal: No acute or significant osseous findings. 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 No evidence of discitis or osteomyelitis. Review of the MIP images confirms the above findings. IMPRESSION: 1. Interval stenting of the proximal superior mesenteric artery with stent patency. The aortoiliac stent graft and bilateral renal artery stents remain patent. 2. 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 Thrombus extends anterior to the intima within the distal abdominal aorta. No associated suspicious peripheral enhancement or gas within the excluded aneurysm sac to strongly suggest infection at this time (not fully excluded in this clinical context). 3. No evidence of bowel ischemia or perforation. Previously demonstrated small bowel distension has improved. 4. New right greater than left pleural effusions with associated bibasilar atelectasis and possible edema. 5. Increased generalized subcutaneous edema and small amount of ascites, likely due to fluid overload. 6. 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 7. Feeding tube extends into the proximal jejunum. 8. Critical Value/emergent results were called by telephone at the time of interpretation on 06/28/2023 at 2:30 pm to provider Maylene Spear, who  reports that the vascular team is aware of these findings and has taken the patient to the operating room. Electronically Signed   By: Elmon Hagedorn M.D.   On: 06/28/2023 14:38   HYBRID OR IMAGING ([facility] ONLY) Result Date: 06/28/2023 There is no interpretation for this exam.  This order is for images obtained during a surgical procedure.  Please See "Surgeries" Tab for more information regarding the procedure.   DG CHEST PORT 1 VIEW Result Date: 06/27/2023 CLINICAL DATA:  Dyspnea EXAM: PORTABLE CHEST 1 VIEW COMPARISON:  06/24/2023 and older x-ray FINDINGS: Stable enteric tube. Removal of the right IJ Cordis. New right-sided PICC with tip along the central SVC above the right atrium. Normal cardiopericardial silhouette. Calcified aorta. No pneumothorax or effusion. Apical pleural thickening. There is persistent bilateral hazy patchy opacities. Slightly less confluent than previous. Overall similar distribution. Recommend continued follow-up. Aortic endograft seen along the visualized upper abdomen at the very edge of the imaging field. IMPRESSION: Removal of right IJ Cordis with a new right-sided PICC. No pneumothorax. Decreasing confluence of the ill-defined hazy bilateral lung opacities with significant residual. Recommend continued follow-up Electronically  Signed   By: Adrianna Horde M.D.   On: 06/27/2023 13:16   US  EKG SITE RITE Result Date: 06/24/2023 If Site Rite image not attached, placement could not be confirmed due to current cardiac rhythm.  DG CHEST PORT 1 VIEW Result Date: 06/24/2023 CLINICAL DATA:  Central line placement EXAM: PORTABLE CHEST 1 VIEW COMPARISON:  06/22/2023 FINDINGS: Right internal jugular vascular sheath remains in place, unchanged. No additional central line visualized. Feeding tube enters the stomach, the distal aspect of the tube not visualized on this chest x-ray. Heart and mediastinal contours within normal limits. Patchy bilateral airspace disease is similar prior study.  No visible effusions or pneumothorax. No acute bony abnormality. IMPRESSION: Right internal jugular vascular sheath remains in place, unchanged. No pneumothorax. Patchy bilateral airspace disease concerning for pneumonia. No real change. Electronically Signed   By: Janeece Mechanic M.D.   On: 06/24/2023 20:12   ECHOCARDIOGRAM COMPLETE Result Date: 06/24/2023    ECHOCARDIOGRAM REPORT   Patient Name:   Erik [name]. Date of Exam: 06/24/2023 Medical Rec #:  [id number]             Height:       66.0 in Accession #:    [id number]            Weight:       113.8 lb Date of Birth:  [birth year]              BSA:          1.573 m Patient Age:    83 years              BP:           104/69 mmHg Patient Gender: M                     HR:           83 bpm. Exam Location:  Inpatient Procedure: 2D Echo, Cardiac Doppler and Color Doppler (Both Spectral and Color            Flow Doppler were utilized during procedure). Indications:    Shortness of breath  History:        Patient has prior history of Echocardiogram examinations, most                 recent 02/03/2022. CHF, CAD, COPD; Risk Factors:Hypertension.  Sonographer:    Astrid Blamer Referring Phys: [id number] [facility] ELLISON IMPRESSIONS  1. Left ventricular ejection fraction, by estimation, is 50 to 55%. The left ventricle has low normal function. The left ventricle has no regional wall motion abnormalities. There is mild left ventricular hypertrophy. Left ventricular diastolic parameters are indeterminate.  2. Right ventricular systolic function is normal. The right ventricular size is normal.  3. Left atrial size was moderately dilated.  4. Right atrial size was mildly dilated.  5. The mitral valve is degenerative. Trivial mitral valve regurgitation. Moderate mitral stenosis. The mean mitral valve gradient is 9.5 mmHg. Severe mitral annular calcification.  6. The aortic valve is tricuspid. There is severe calcifcation of the aortic valve. Aortic valve regurgitation is severe.  Mild aortic valve stenosis. Aortic valve area, by VTI measures 1.51 cm. Aortic valve mean gradient measures 10.0 mmHg. Aortic valve Vmax measures 2.14 m/s.  7. The inferior vena cava is normal in size with greater than 50% respiratory variability, suggesting right atrial pressure of 3 mmHg. FINDINGS  Left Ventricle: Left ventricular ejection fraction, by estimation,  is 50 to 55%. The left ventricle has low normal function. The left ventricle has no regional wall motion abnormalities. The left ventricular internal cavity size was normal in size. There is mild left ventricular hypertrophy. Abnormal (paradoxical) septal motion, consistent with left bundle branch block. Left ventricular diastolic parameters are indeterminate. Right Ventricle: The right ventricular size is normal. No increase in right ventricular wall thickness. Right ventricular systolic function is normal. Left Atrium: Left atrial size was moderately dilated. Right Atrium: Right atrial size was mildly dilated. Pericardium: There is no evidence of pericardial effusion. Mitral Valve: The mitral valve is degenerative in appearance. Severe mitral annular calcification. Trivial mitral valve regurgitation. Moderate mitral valve stenosis. MV peak gradient, 17.6 mmHg. The mean mitral valve gradient is 9.5 mmHg. Tricuspid Valve: The tricuspid valve is normal in structure. Tricuspid valve regurgitation is trivial. No evidence of tricuspid stenosis. Aortic Valve: The aortic valve is tricuspid. There is severe calcifcation of the aortic valve. Aortic valve regurgitation is severe. Mild aortic stenosis is present. Aortic valve mean gradient measures 10.0 mmHg. Aortic valve peak gradient measures 18.3 mmHg. Aortic valve area, by VTI measures 1.51 cm. Pulmonic Valve: The pulmonic valve was normal in structure. Pulmonic valve regurgitation is not visualized. No evidence of pulmonic stenosis. Aorta: The aortic root is normal in size and structure. Venous: The  inferior vena cava was not well visualized. The inferior vena cava is normal in size with greater than 50% respiratory variability, suggesting right atrial pressure of 3 mmHg. IAS/Shunts: No atrial level shunt detected by color flow Doppler.  LEFT VENTRICLE PLAX 2D LVIDd:         4.40 cm   Diastology LVIDs:         4.10 cm   LV e' medial:    5.00 cm/s LV PW:         1.00 cm   LV E/e' medial:  24.6 LV IVS:        1.30 cm   LV e' lateral:   5.11 cm/s LVOT diam:     1.80 cm   LV E/e' lateral: 24.1 LV SV:         62 LV SV Index:   39 LVOT Area:     2.54 cm  RIGHT VENTRICLE RV S prime:     18.00 cm/s TAPSE (M-mode): 2.8 cm LEFT ATRIUM           Index        RIGHT ATRIUM           Index LA Vol (A4C): 35.5 ml 22.56 ml/m  RA Area:     13.40 cm                                    RA Volume:   29.90 ml  19.00 ml/m  AORTIC VALVE AV Area (Vmax):    1.56 cm AV Area (Vmean):   1.47 cm AV Area (VTI):     1.51 cm AV Vmax:           214.00 cm/s AV Vmean:          151.000 cm/s AV VTI:            0.408 m AV Peak Grad:      18.3 mmHg AV Mean Grad:      10.0 mmHg LVOT Vmax:         131.00 cm/s LVOT Vmean:  87.400 cm/s LVOT VTI:          0.242 m LVOT/AV VTI ratio: 0.59  AORTA Ao Root diam: 2.90 cm MITRAL VALVE                TRICUSPID VALVE MV Area (PHT): 3.03 cm     TR Peak grad:   35.8 mmHg MV Area VTI:   1.37 cm     TR Vmax:        299.00 cm/s MV Peak grad:  17.6 mmHg MV Mean grad:  9.5 mmHg     SHUNTS MV Vmax:       2.10 m/s     Systemic VTI:  0.24 m MV Vmean:      146.0 cm/s   Systemic Diam: 1.80 cm MV Decel Time: 250 msec MV E velocity: 123.00 cm/s MV A velocity: 172.00 cm/s MV E/A ratio:  0.72 Jules Oar MD Electronically signed by Jules Oar MD Signature Date/Time: 06/24/2023/1:08:14 PM    Final    DG Abd Portable 1V Result Date: 06/24/2023 CLINICAL DATA:  Feeding tube placement. EXAM: PORTABLE ABDOMEN - 1 VIEW COMPARISON:  06/20/2023 FINDINGS: New weighted enteric tube with tip in the left upper  quadrant in the region of the proximal jejunum. The previous non weighted enteric tube has been removed. Aorto bi-iliac stent graft in place. Persistent gaseous colonic distension. Prominent but not abnormally dilated small bowel. IMPRESSION: Weighted enteric tube with tip in the left upper quadrant in the region of the proximal jejunum. Electronically Signed   By: Chadwick Colonel M.D.   On: 06/24/2023 12:39   DG CHEST PORT 1 VIEW Result Date: 06/22/2023 CLINICAL DATA:  Hypoxia. EXAM: PORTABLE CHEST 1 VIEW COMPARISON:  06/20/2023 FINDINGS: Interval extubation. Enteric tube in right internal jugular sheath persist. Stable heart size and mediastinal contours. Worsening heterogeneous interstitial opacities, potential pulmonary edema. Small bilateral pleural effusions have also increased. No pneumothorax. IMPRESSION: 1. Interval extubation. 2. Worsening heterogeneous interstitial opacities, potential pulmonary edema. 3. Increased small bilateral pleural effusions. Electronically Signed   By: Chadwick Colonel M.D.   On: 06/22/2023 22:49   DG CHEST PORT 1 VIEW Result Date: 06/21/2023 CLINICAL DATA:  [id number] History of ETT [id number] EXAM: PORTABLE CHEST 1 VIEW COMPARISON:  Chest x-ray 06/20/2023 12:14 a.m. FINDINGS: Interval advancement of an endotracheal tube with tip terminating 6 cm above the carina. Enteric tube courses below the diaphragm with tip and side port overlying the expected region of the gastric lumen. Right central venous catheter Cordis with tip overlying the expected region of the distal superior vena cava. The heart and mediastinal contours are unchanged. Prominent hilar vasculature. No focal consolidation. Persistent nodular interstitial markings. Blunting of left costophrenic angle with possible underlying trace pleural effusion. No pneumothorax. No acute osseous abnormality. Partially visualized abdominal aortic stent. IMPRESSION: 1. Pulmonary venous congestion. 2. Persistent nodular interstitial  markings. 3. Lines and tubes as above. 4. Aortic Atherosclerosis (ICD10-I70.0) and Emphysema (ICD10-J43.9). Electronically Signed   By: Morgane  Naveau M.D.   On: 06/21/2023 01:02   DG Chest Port 1 View Result Date: 06/21/2023 CLINICAL DATA:  [id number] Respirator dependence [facility]) [id number] EXAM: PORTABLE CHEST 1 VIEW COMPARISON:  Chest x-ray 03/17/2023, CT chest 06/17/2022, CT abdomen pelvis 06/19/2023 FINDINGS: Endotracheal tube with tip terminating 7 cm above the carina. Enteric tube courses below the hemidiaphragm with tip and side port overlying the expected region of the gastric lumen. Right internal jugular central venous catheter Cordis with tip overlying the expected region of distal superior  vena cava. The heart and mediastinal contours are unchanged. Atherosclerotic plaque. Prominent hilar vasculature. No focal consolidation. Persistent nodular interstitial markings. Question trace left pleural effusion. No right pleural effusion. No pneumothorax. No acute osseous abnormality. Partially visualized abdominal aortic stent. IMPRESSION: 1. Pulmonary venous congestion. 2. Persistent nodular interstitial markings. 3. Question trace left pleural effusion. 4. Aortic Atherosclerosis (ICD10-I70.0) and Emphysema (ICD10-J43.9). Electronically Signed   By: Morgane  Naveau M.D.   On: 06/21/2023 00:14   DG Abd Portable 1V-Small Bowel Obstruction Protocol-initial, 8 hr delay Result Date: 06/20/2023 CLINICAL DATA:  Reason for exam: SBO 8 hr delay Note: Pt in OR during 8 hour mark: picture taken at 9.5hour delay EXAM: PORTABLE ABDOMEN - 1 VIEW COMPARISON:  CT abdomen pelvis 06/19/2023 FINDINGS: PO contrast likely within the colonic lumen. Gaseous dilatation of the large bowel. Surgical clips noted. Enteric tube courses below the hemidiaphragm with tip and side port overlying the expected region of the gastric lumen. Previously administered intravenous contrast noted partially opacifying the bilateral collecting systems. Aorto  bi-iliac stent noted. Severe atherosclerotic plaque. Severe mitral annular calcification. IMPRESSION: 1. PO contrast likely within the colonic lumen. Recommend follow-up to further evaluate migration of PO contrast. 2.  Aortic Atherosclerosis (ICD10-I70.0). Electronically Signed   By: Morgane  Naveau M.D.   On: 06/20/2023 21:38   HYBRID OR IMAGING ([facility] ONLY) Result Date: 06/20/2023 There is no interpretation for this exam.  This order is for images obtained during a surgical procedure.  Please See "Surgeries" Tab for more information regarding the procedure.   DG Abd Portable 1V Result Date: 06/20/2023 CLINICAL DATA:  NG tube placement EXAM: PORTABLE ABDOMEN - 1 VIEW COMPARISON:  06/20/2023 FINDINGS: NG tube tip is in the stomach with the side port in the proximal stomach. Nonobstructive bowel gas pattern. Abdominal aortic stent graft noted. Visualized lungs clear. IMPRESSION: NG tube tip in the stomach. Electronically Signed   By: Janeece Mechanic M.D.   On: 06/20/2023 13:52   DG Abd 1 View Result Date: 06/20/2023 CLINICAL DATA:  Abdominal pain and vomiting. EXAM: ABDOMEN - 1 VIEW COMPARISON:  06/19/2023 FINDINGS: Cholecystectomy clips. Status post stent graft repair of the abdominal aorta. Bilateral renal artery stents. Decreased gaseous distension of the small bowel loops. Previous stool burden within the right colon has progressed to the level of the splenic flexure and proximal descending colon. Contrast material noted within the bladder. IMPRESSION: 1. Decreased gaseous distension of the small bowel loops. 2. Antegrade progression of moderate retained stool in the right colon. Electronically Signed   By: Kimberley Penman M.D.   On: 06/20/2023 06:37   CT Angio Abd/Pel W and/or Wo Contrast Result Date: 06/19/2023 CLINICAL DATA:  Abdominal pain EXAM: CTA ABDOMEN AND PELVIS WITHOUT AND WITH CONTRAST TECHNIQUE: Multidetector CT imaging of the abdomen and pelvis was performed using the standard protocol during  bolus administration of intravenous contrast. Multiplanar reconstructed images and MIPs were obtained and reviewed to evaluate the vascular anatomy. RADIATION DOSE REDUCTION: This exam was performed according to the departmental dose-optimization program which includes automated exposure control, adjustment of the mA and/or kV according to patient size and/or use of iterative reconstruction technique. CONTRAST:  OMNIPAQUE  IOHEXOL  350 MG/ML SOLN COMPARISON:  CT 06/09/2023 FINDINGS: VASCULAR Aorta: Status post endovascular repair of aortic aneurysm with endovascular stent extending from the distal descending thoracic aorta to the iliac vessels. Patent stent. Maximum diameter of excluded aneurysm sac measures 5.4 cm, previously 5.4 cm. 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 Distal patency with no aneurysm or dissection. 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 Distal vascular patency and no dissection or aneurysm Renals: Bilateral renal artery stents which appear patent IMA: Occluded as before. Inflow: Patent common iliac artery stents. External iliac arteries are widely patent. Stenosis at the origin of the internal iliac arteries. Proximal Outflow: Bilateral common femoral and visualized portions of the superficial and profunda femoral arteries are patent without evidence of aneurysm, dissection, vasculitis or significant stenosis. Veins: Suboptimally evaluated Review of the MIP images confirms the above findings. NON-VASCULAR Lower chest: Lung bases demonstrate emphysema. No acute airspace disease. Hepatobiliary: Cholecystectomy.  No biliary dilatation. Pancreas: No inflammation. Ductal dilatation up to 5 mm which is chronic. Spleen: Normal in size without focal abnormality. Adrenals/Urinary Tract: Adrenal glands are normal. Kidneys show no hydronephrosis. The bladder is unremarkable Stomach/Bowel: Moderate fluid distension of the  stomach. Interval development of multiple fluid-filled mildly dilated loops of small bowel measuring up to 3.1 cm. No acute bowel wall thickening. Large volume frothy stool in the right colon. Lymphatic: No suspicious lymph nodes Reproductive: Enlarged prostate gland Other: Negative for pelvic effusion or free air Musculoskeletal: No acute or suspicious osseous abnormality. IMPRESSION: VASCULAR 1. 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 2. 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 NON-VASCULAR 1. Interim development of multiple fluid-filled mildly dilated loops of small bowel, without well-defined transition point suggestive of mild ileus or partial/developing bowel obstruction. 2. Enlarged prostate Electronically Signed   By: Esmeralda Hedge M.D.   On: 06/19/2023 23:10   CT ANGIO ABDOMEN PELVIS  W & WO CONTRAST Result Date: 06/11/2023 CLINICAL DATA:  Abdominal aortic aneurysm without rupture with history of prior endovascular repair in 2017 EXAM: CTA ABDOMEN AND PELVIS WITHOUT AND WITH CONTRAST TECHNIQUE: Multidetector CT imaging of the abdomen and pelvis was performed using the standard protocol during bolus administration of intravenous contrast. Multiplanar reconstructed images and MIPs were obtained and reviewed to evaluate the vascular anatomy. RADIATION DOSE REDUCTION: This exam was performed according to the departmental dose-optimization program which includes automated exposure control, adjustment of the mA and/or kV according to patient size and/or use of iterative reconstruction technique. CONTRAST:  75mL ISOVUE -370 IOPAMIDOL  (ISOVUE -370) INJECTION 76% COMPARISON:  Prior CT abdomen/pelvis 03/09/2022 FINDINGS: VASCULAR Aorta: Abdominal aortic  aneurysm status post fenestrated endovascular aortic repair extending from the distal descending thoracic aorta throughout the abdominal aorta and into the bilateral common iliac arteries. No evidence of endoleak on arterial or delayed phase images. The excluded aneurysm sac is grossly stable in size at 5.4 cm. Celiac: Significant stenosis of the proximal celiac artery the due to focal kinking of the vessel likely from median arcuate ligament compression. Poststenotic dilation measuring up to 1.1 cm. SMA: Focal moderate to high-grade stenosis of the origin of the SMA. Renals: Bilateral renal artery stents are patent. IMA: Occluded by endovascular repair. The distal branches reconstitute via collateral flow. Inflow: Calcified atherosclerotic plaque results in stenosis of the origins of both hypogastric arteries. No evidence of complication at the distal landing zones. The external iliac arteries are widely patent. Proximal Outflow: Bilateral common femoral and visualized portions of the superficial and profunda femoral arteries are patent without evidence of aneurysm, dissection, vasculitis or significant stenosis.  Veins: No focal venous abnormality. Review of the MIP images confirms the above findings. NON-VASCULAR Lower chest: Small focus of tree-in-bud micro nodularity in the medial aspect of the right middle lobe has significantly improved compared to prior imaging from April of 2024. Findings likely reflect the sequelae of a prior infectious/inflammatory process. No acute cardiopulmonary process. Hepatobiliary: No focal liver abnormality is seen. Status post cholecystectomy. No biliary dilatation. Pancreas: Unremarkable. No pancreatic ductal dilatation or surrounding inflammatory changes. Spleen: Normal in size without focal abnormality. Adrenals/Urinary Tract: Adrenal glands are unremarkable. Kidneys are normal, without renal calculi, focal lesion, or hydronephrosis. Bladder is unremarkable. Stomach/Bowel:  Colonic diverticular disease without CT evidence of active inflammation. No focal bowel wall thickening or evidence of obstruction. Lymphatic: No suspicious lymphadenopathy. Reproductive: Prostatomegaly. Other: No abdominal wall hernia or abnormality. No abdominopelvic ascites. Musculoskeletal: No acute fracture or aggressive appearing lytic or blastic osseous lesion. IMPRESSION: VASCULAR 1. Successful multi fenestrated endovascular aortic repair of infrarenal abdominal aortic aneurysm. The aneurysm sac is fully excluded and there is no evidence of endoleak or other complication. The excluded aneurysm sac measures up to 5.4 cm in diameter. 2. 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 3. 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 4. Both renal stents are widely patent. 5. Extensive atherosclerotic vascular calcifications. Aortic Atherosclerosis (ICD10-I70.0). NON-VASCULAR 1. No acute abnormality within the abdomen or pelvis. 2. Colonic diverticular disease without CT evidence of active inflammation. 3. Prostatomegaly. Electronically Signed   By: Fernando Hoyer M.D.   On: 06/11/2023 07:49    Microbiology: Results for orders placed or performed during the hospital encounter of 06/19/23  MRSA Next Gen by PCR, Nasal     Status: Abnormal   Collection Time: 06/25/23  9:59 PM   Specimen: Nasal Mucosa; Nasal Swab  Result Value Ref Range Status   MRSA by PCR Next Gen DETECTED (A) NOT DETECTED Final    Comment: RESULT CALLED TO, READ BACK BY AND VERIFIED WITH: B WARNER,RN@[id number]  06/26/23 MK (NOTE) The GeneXpert MRSA Assay (FDA approved for NASAL specimens only), is one component of a comprehensive MRSA colonization surveillance program. It is not intended to diagnose MRSA infection nor to guide or monitor treatment for MRSA infections. Test performance is not FDA approved in patients less than 56  years old. Performed at [facility], [address] [place], [place] [address]   Culture, blood (Routine X 2) w Reflex to ID Panel     Status: Abnormal   Collection Time: 06/28/23  9:16 AM   Specimen: BLOOD  Result Value Ref Range Status   Specimen Description BLOOD SITE NOT SPECIFIED  Final   Special Requests   Final    BOTTLES DRAWN AEROBIC AND ANAEROBIC Blood Culture results may not be optimal due to an inadequate volume of blood received in culture bottles   Culture  Setup Time   Final    GRAM POSITIVE COCCI IN CLUSTERS IN BOTH AEROBIC AND ANAEROBIC BOTTLES CRITICAL RESULT CALLED TO, READ BACK BY AND VERIFIED WITH: J [facility]  06/29/23 MK    Culture (A)  Final    STAPHYLOCOCCUS AUREUS SUSCEPTIBILITIES PERFORMED ON PREVIOUS CULTURE WITHIN THE LAST 5 DAYS. Performed at [facility], [address], [place], [place] [address]    Report Status 07/01/2023 FINAL  Final  Culture, blood (Routine X 2) w Reflex to ID Panel     Status: Abnormal   Collection Time:  06/28/23  9:16 AM   Specimen: BLOOD  Result Value Ref Range Status   Specimen Description BLOOD SITE NOT SPECIFIED  Final   Special Requests   Final    BOTTLES DRAWN AEROBIC AND ANAEROBIC Blood Culture results may not be optimal due to an inadequate volume of blood received in culture bottles   Culture  Setup Time   Final    GRAM POSITIVE COCCI IN CLUSTERS GRAM NEGATIVE RODS IN BOTH AEROBIC AND ANAEROBIC BOTTLES CRITICAL RESULT CALLED TO, READ BACK BY AND VERIFIED WITH: J [facility]  06/29/23 MK Performed at [facility], [address], [place], [place] [address]    Culture (A)  Final    ESCHERICHIA COLI METHICILLIN RESISTANT STAPHYLOCOCCUS AUREUS    Report Status 07/01/2023 FINAL  Final   Organism ID, Bacteria ESCHERICHIA COLI  Final   Organism ID, Bacteria ESCHERICHIA COLI  Final   Organism ID, Bacteria METHICILLIN RESISTANT STAPHYLOCOCCUS AUREUS  Final      Susceptibility    Escherichia coli - KIRBY BAUER*    CEFAZOLIN  INTERMEDIATE Intermediate    Escherichia coli - MIC*    AMPICILLIN 16 INTERMEDIATE Intermediate     CEFEPIME  <=0.12 SENSITIVE Sensitive     CEFTAZIDIME <=1 SENSITIVE Sensitive     CEFTRIAXONE  <=0.25 SENSITIVE Sensitive     CIPROFLOXACIN <=0.25 SENSITIVE Sensitive     GENTAMICIN <=1 SENSITIVE Sensitive     IMIPENEM 0.5 SENSITIVE Sensitive     TRIMETH/SULFA <=20 SENSITIVE Sensitive     AMPICILLIN/SULBACTAM 8 SENSITIVE Sensitive     PIP/TAZO <=4 SENSITIVE Sensitive ug/mL    * ESCHERICHIA COLI    ESCHERICHIA COLI   Methicillin resistant staphylococcus aureus - MIC*    CIPROFLOXACIN <=0.5 SENSITIVE Sensitive     ERYTHROMYCIN >=8 RESISTANT Resistant     GENTAMICIN <=0.5 SENSITIVE Sensitive     OXACILLIN >=4 RESISTANT Resistant     TETRACYCLINE <=1 SENSITIVE Sensitive     VANCOMYCIN  <=0.5 SENSITIVE Sensitive     TRIMETH/SULFA <=10 SENSITIVE Sensitive     CLINDAMYCIN <=0.25 SENSITIVE Sensitive     RIFAMPIN <=0.5 SENSITIVE Sensitive     Inducible Clindamycin NEGATIVE Sensitive     LINEZOLID 2 SENSITIVE Sensitive     * METHICILLIN RESISTANT STAPHYLOCOCCUS AUREUS  Blood Culture ID Panel (Reflexed)     Status: Abnormal   Collection Time: 06/28/23  9:16 AM  Result Value Ref Range Status   Enterococcus faecalis NOT DETECTED NOT DETECTED Final   Enterococcus Faecium NOT DETECTED NOT DETECTED Final   Listeria monocytogenes NOT DETECTED NOT DETECTED Final   Staphylococcus species DETECTED (A) NOT DETECTED Final    Comment: CRITICAL RESULT CALLED TO, READ BACK BY AND VERIFIED WITH: J [facility]  06/29/23 MK    Staphylococcus aureus (BCID) DETECTED (A) NOT DETECTED Final    Comment: Methicillin (oxacillin)-resistant Staphylococcus aureus (MRSA). MRSA is predictably resistant to beta-lactam antibiotics (except ceftaroline). Preferred therapy is vancomycin  unless clinically contraindicated. Patient requires contact precautions if   hospitalized. CRITICAL RESULT CALLED TO, READ BACK BY AND VERIFIED WITH: J WYLAND,PHARMD@0046  06/29/23 MK    Staphylococcus epidermidis NOT DETECTED NOT DETECTED Final   Staphylococcus lugdunensis NOT DETECTED NOT DETECTED Final   Streptococcus species NOT DETECTED NOT DETECTED Final   Streptococcus agalactiae NOT DETECTED NOT DETECTED Final   Streptococcus pneumoniae NOT DETECTED NOT DETECTED Final   Streptococcus pyogenes NOT DETECTED NOT DETECTED Final   A.calcoaceticus-baumannii NOT DETECTED NOT DETECTED Final   Bacteroides fragilis NOT DETECTED NOT DETECTED Final   Enterobacterales DETECTED (  A) NOT DETECTED Final    Comment: Enterobacterales represent a large order of gram negative bacteria, not a single organism. CRITICAL RESULT CALLED TO, READ BACK BY AND VERIFIED WITH:  J WYLAND,PHARMD@0046  06/29/23 MK    Enterobacter cloacae complex NOT DETECTED NOT DETECTED Final   Escherichia coli DETECTED (A) NOT DETECTED Final    Comment: CRITICAL RESULT CALLED TO, READ BACK BY AND VERIFIED WITH:  J [facility]  06/29/23 MK    Klebsiella aerogenes NOT DETECTED NOT DETECTED Final   Klebsiella oxytoca NOT DETECTED NOT DETECTED Final   Klebsiella pneumoniae NOT DETECTED NOT DETECTED Final   Proteus species NOT DETECTED NOT DETECTED Final   Salmonella species NOT DETECTED NOT DETECTED Final   Serratia marcescens NOT DETECTED NOT DETECTED Final   Haemophilus influenzae NOT DETECTED NOT DETECTED Final   Neisseria meningitidis NOT DETECTED NOT DETECTED Final   Pseudomonas aeruginosa NOT DETECTED NOT DETECTED Final   Stenotrophomonas maltophilia NOT DETECTED NOT DETECTED Final   Candida albicans NOT DETECTED NOT DETECTED Final   Candida auris NOT DETECTED NOT DETECTED Final   Candida glabrata NOT DETECTED NOT DETECTED Final   Candida krusei NOT DETECTED NOT DETECTED Final   Candida parapsilosis NOT DETECTED NOT DETECTED Final   Candida tropicalis NOT DETECTED NOT DETECTED Final    Cryptococcus neoformans/gattii NOT DETECTED NOT DETECTED Final   CTX-M ESBL NOT DETECTED NOT DETECTED Final   Carbapenem resistance IMP NOT DETECTED NOT DETECTED Final   Carbapenem resistance KPC NOT DETECTED NOT DETECTED Final   Meth resistant mecA/C and MREJ DETECTED (A) NOT DETECTED Final    Comment: CRITICAL RESULT CALLED TO, READ BACK BY AND VERIFIED WITH:  J WYLAND,PHARMD@0046  06/29/23 MK    Carbapenem resistance NDM NOT DETECTED NOT DETECTED Final   Carbapenem resist OXA 48 LIKE NOT DETECTED NOT DETECTED Final   Carbapenem resistance VIM NOT DETECTED NOT DETECTED Final    Comment: Performed at [facility], [address] [place], [place] [address]  MRSA Next Gen by PCR, Nasal     Status: Abnormal   Collection Time: 06/28/23  8:52 PM   Specimen: Nasal Mucosa; Nasal Swab  Result Value Ref Range Status   MRSA by PCR Next Gen DETECTED (A) NOT DETECTED Final    Comment: RESULT CALLED TO, READ BACK BY AND VERIFIED WITH: RN Coretha Dew [phone] @ 2302 FH (NOTE) The GeneXpert MRSA Assay (FDA approved for NASAL specimens only), is one component of a comprehensive MRSA colonization surveillance program. It is not intended to diagnose MRSA infection nor to guide or monitor treatment for MRSA infections. Test performance is not FDA approved in patients less than 85 years old. Performed at [facility], [address], [place], [place] [address]   Culture, blood (Routine X 2) w Reflex to ID Panel     Status: None (Preliminary result)   Collection Time: 06/29/23  5:58 AM   Specimen: BLOOD LEFT ARM  Result Value Ref Range Status   Specimen Description BLOOD LEFT ARM  Final   Special Requests   Final    BOTTLES DRAWN AEROBIC AND ANAEROBIC Blood Culture results may not be optimal due to an inadequate volume of blood received in culture bottles   Culture   Final    NO GROWTH 4 DAYS Performed at [facility], [address], [place], [place] [address]    Report Status  PENDING  Incomplete  Culture, blood (Routine X 2) w Reflex to ID Panel  Status: None (Preliminary result)   Collection Time: 06/29/23  6:00 AM   Specimen: BLOOD LEFT ARM  Result Value Ref Range Status   Specimen Description BLOOD LEFT ARM  Final   Special Requests   Final    BOTTLES DRAWN AEROBIC AND ANAEROBIC Blood Culture adequate volume   Culture   Final    NO GROWTH 4 DAYS Performed at [facility], [address], [place], [place] [address]    Report Status PENDING  Incomplete  Culture, blood (Routine X 2) w Reflex to ID Panel     Status: None (Preliminary result)   Collection Time: 06/29/23 11:08 AM   Specimen: BLOOD RIGHT HAND  Result Value Ref Range Status   Specimen Description BLOOD RIGHT HAND  Final   Special Requests   Final    BOTTLES DRAWN AEROBIC AND ANAEROBIC Blood Culture adequate volume   Culture   Final    NO GROWTH 4 DAYS Performed at [facility], [address], [place], [place] [address]    Report Status PENDING  Incomplete  Culture, blood (Routine X 2) w Reflex to ID Panel     Status: None (Preliminary result)   Collection Time: 06/29/23 11:17 AM   Specimen: BLOOD RIGHT ARM  Result Value Ref Range Status   Specimen Description BLOOD RIGHT ARM  Final   Special Requests   Final    BOTTLES DRAWN AEROBIC AND ANAEROBIC Blood Culture adequate volume   Culture   Final    NO GROWTH 4 DAYS Performed at [facility], [address], [place], [place] [address]    Report Status PENDING  Incomplete  Culture, blood (Routine X 2) w Reflex to ID Panel     Status: None (Preliminary result)   Collection Time: 07/01/23 12:42 PM   Specimen: BLOOD RIGHT ARM  Result Value Ref Range Status   Specimen Description BLOOD RIGHT ARM  Final   Special Requests   Final    BOTTLES DRAWN AEROBIC AND ANAEROBIC Blood Culture results may not be optimal due to an inadequate volume of blood received in culture bottles   Culture   Final    NO GROWTH 2  DAYS Performed at [facility], [address] [place], [place] [address]    Report Status PENDING  Incomplete  Culture, blood (Routine X 2) w Reflex to ID Panel     Status: None (Preliminary result)   Collection Time: 07/01/23 12:44 PM   Specimen: BLOOD RIGHT ARM  Result Value Ref Range Status   Specimen Description BLOOD RIGHT ARM  Final   Special Requests   Final    BOTTLES DRAWN AEROBIC AND ANAEROBIC Blood Culture adequate volume   Culture   Final    NO GROWTH 2 DAYS Performed at [facility], [address] [place], [place] [address]    Report Status PENDING  Incomplete    Labs: CBC: Recent Labs  Lab 06/29/23 0557 06/29/23 0906 06/30/23 0412 06/30/23 1117 07/01/23 0448 07/02/23 0643 07/03/23 0704  WBC 7.1  --  12.3*  --  10.3 10.5 10.8*  HGB 8.0*   < > 8.1* 8.2* 8.3* 8.7* 9.3*  HCT 23.8*   < > 24.1* 24.0* 25.0* 27.2* 28.9*  MCV 85.6  --  85.2  --  85.9 86.9 88.9  PLT 64*  --  87*  --  112* 150 163   < > = values in this interval not displayed.  Basic Metabolic Panel: Recent Labs  Lab 06/27/23 0515 06/27/23 1815 06/28/23 0554 06/28/23 1530 06/28/23 1911 06/28/23 1942 06/29/23 0557 06/29/23 0906 06/29/23 1638 06/30/23 0412 06/30/23 1117 06/30/23 1657 07/01/23 0448 07/02/23 0643 07/03/23 0704  NA 155*  --  150*   < > 150*   < > 146* 150*  --  149* 150*  --   --  155* 156*  K 4.2  --  4.4   < > 4.9   < > 3.6 3.5  --  3.1* 3.5  --   --  3.7 4.7  CL 127*  --  125*  --  125*  --  118*  --   --  114*  --   --   --  125* 125*  CO2 19*  --  17*  --  20*  --  21*  --   --  22  --   --   --  25 23  GLUCOSE 168*  --  176*  --  166*  --  179*  --   --  142*  --   --   --  129* 127*  BUN 55*  --  58*  --  62*  --  61*  --   --  65*  --   --   --  60* 59*  CREATININE 1.26*  --  1.37*  --  1.44*  --  1.39*  --   --  1.58*  --   --   --  1.65* 1.61*  CALCIUM  7.7*  --  7.8*  --  6.9*  --  6.6*  --   --  7.0*  --   --   --  7.8* 7.8*  MG 2.5*   < > 2.3   --  2.2  --  2.1  --  2.3 2.4  --  2.5* 2.7* 2.6*  --   PHOS 2.5  --  2.8  --   --   --  3.9  --   --  4.0  --   --   --  3.3  --    < > = values in this interval not displayed.   Liver Function Tests: Recent Labs  Lab 06/27/23 0515 06/30/23 0412  AST 43* 24  ALT 80* 27  ALKPHOS 41 50  BILITOT 1.1 1.0  PROT 4.8* 5.1*  ALBUMIN  <1.5* 2.0*   CBG: Recent Labs  Lab 07/02/23 2018 07/03/23 0008 07/03/23 0348 07/03/23 0755 07/03/23 1126  GLUCAP 139* 117* 127* 118* 110*   Discharge time spent: less than 30 minutes.   Signed: Cherylle Corwin, MD [facility] 07/03/2023

## 2023-07-03 NOTE — Progress Notes (Signed)
 Arlin Benes [phone] [facility] Liaison Note  Referral received from Palliative NP Michelle Ferolito for family interest in [facility].   Spoke with patient wife via phone to explain services and hospice philosophy and all questions answered.   Hospice Home is able to accept patient this afternoon once consents are complete.   RN staff, you may call report at any time to [facility] @ [phone], room is assigned when report is called.   Please leave IV intact and send completed DNR with patient.   Updated attending and hospital team via Epic chat.  Thank you for the opportunity to participate in this patient's care   Jacqlyn Matas, BSN, RN Hospice Nurse Liaison [phone]

## 2023-07-03 NOTE — Progress Notes (Signed)
      INFECTIOUS DISEASE ATTENDING ADDENDUM:   Date: 07/03/2023  Patient name: Erik [name].  Medical record number: [id number]  Date of birth: Feb 09, 1941   Patient transition to hospice care.  If antibiotics are desired as a part of this could use Zyvox 600 mg twice daily to cover MRSA and Ciprofloxacin 500 mg po BID to cover the E coli and complete 2 weeks of therapy from date of his negative blood cultures.  Will defer to palliative and primary team.  I will sign off for now  Please call with further questions.    Sheela Denmark 07/03/2023, 3:47 PM

## 2023-07-03 NOTE — Hospital Course (Signed)
 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 Pt later found to have necrotic R colon s/p R colectomy as well as L renal artery and aortic artery stent. PCCM was following for pressor support and for airway on ventilator. Pt later extubated and weaned off pressors. Vascular surgery was later concerned about graft infection with recs for Palliative Care. Palliative Care was consulted and on 4/20, patient was transitioned to hospice with full comfort.

## 2023-07-03 NOTE — Progress Notes (Signed)
 Patient transfer to hospice facility.  Report call and given to receiving facility.  Family's family at bedside.

## 2023-07-03 NOTE — Progress Notes (Signed)
 5 Days Post-Op  Subjective: NAEO. Tolerating TF. 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  Objective: Vital signs in last 24 hours: Temp:  [98.4 F (36.9 C)-99.3 F (37.4 C)] 99 F (37.2 C) (04/20 0800) Pulse Rate:  [83-102] 93 (04/20 0800) Resp:  [14-27] 26 (04/20 0800) BP: (100-130)/(50-72) 129/72 (04/20 0800) SpO2:  [94 %-100 %] 99 % (04/20 0800) FiO2 (%):  [28 %] 28 % (04/20 0400) Weight:  [56.5 kg] 56.5 kg (04/20 0500) Last BM Date : 07/02/23  Intake/Output from previous day: 04/19 0701 - 04/20 0700 In: 2275 [NG/GT:2070; IV Piggyback:205] Out: 2985 [Urine:2410; Stool:575] Intake/Output this shift: Total I/O In: 57.4 [NG/GT:45; IV Piggyback:12.4] Out: 50 [Stool:50]  FiO2 (%):  [28 %] 28 %  PE: Gen:  Alert, appears frail Abd: Soft, nondistended, appropriately tender. Ileostomy productive of liquid stool. Mucosa appears healthy   Lab Results:  Recent Labs    07/02/23 0643 07/03/23 0704  WBC 10.5 10.8*  HGB 8.7* 9.3*  HCT 27.2* 28.9*  PLT 150 163   BMET Recent Labs    07/02/23 0643 07/03/23 0704  NA 155* 156*  K 3.7 4.7  CL 125* 125*  CO2 25 23  GLUCOSE 129* 127*  BUN 60* 59*  CREATININE 1.65* 1.61*  CALCIUM  7.8* 7.8*   PT/INR No results for input(s): "LABPROT", "INR" in the last 72 hours.  CMP     Component Value Date/Time   NA 156 (H) 07/03/2023 0704   NA 138 08/18/2018 1135   K 4.7 07/03/2023 0704   CL 125 (H) 07/03/2023 0704   CO2 23 07/03/2023 0704   GLUCOSE 127 (H) 07/03/2023 0704   BUN 59 (H) 07/03/2023 0704   BUN 19 08/18/2018 1135   CREATININE 1.61 (H) 07/03/2023 0704   CREATININE 1.11 10/09/2021 1646   CALCIUM  7.8 (L) 07/03/2023 0704   PROT 5.1 (L) 06/30/2023 0412   ALBUMIN  2.0 (L) 06/30/2023 0412   AST 24 06/30/2023 0412   ALT 27 06/30/2023 0412   ALKPHOS 50 06/30/2023 0412   BILITOT 1.0 06/30/2023 0412   GFRNONAA 42 (L) 07/03/2023 0704   GFRAA 95 08/18/2018 1135   Lipase     Component Value Date/Time   LIPASE 61 (H)  03/09/2022 0812    Studies/Results: DG Abd Portable 1V Result Date: 07/01/2023 CLINICAL DATA:  [id number] Encounter for feeding tube placement [id number] EXAM: PORTABLE ABDOMEN - 1 VIEW COMPARISON:  Multiple, 07/01/2023 FINDINGS: Esophagogastric tube terminates in the region of the stomach. The weighted feeding tube terminates at the duodenal-jejunal junction. Aorta bi-iliac and SMA stents. Nonobstructive bowel gas pattern. Multilevel degenerative disc disease of the spine. IMPRESSION: Esophagogastric tube terminates in the stomach. Weighted feeding tube terminates at the duodenal-jejunal junction. Electronically Signed   By: Rance Burrows M.D.   On: 07/01/2023 14:23   DG Abd Portable 1V Result Date: 07/01/2023 CLINICAL DATA:  [id number] Encounter for feeding tube placement [id number] EXAM: PORTABLE ABDOMEN - 1 VIEW COMPARISON:  July 01, 2023 FINDINGS: Interval advancement of the weighted feeding tube, which crosses midline, and follows the expected course of the duodenum, now terminating in the region of the duodenal jejunal junction. Nonobstructive bowel gas pattern. Aorta bi-iliac stent graft. SMA stent. Cholecystectomy clips. Possible right-sided ostomy device. Patchy opacities in the right mid lung and left lung base. Multilevel degenerative disc disease of the spine. IMPRESSION: 1. Continued advancement of the weighted feeding tube, which follows the expected course of the duodenum, now terminating in the region of the duodenal jejunal junction.  2. Patchy airspace opacities in the visualized lungs. Electronically Signed   By: Rance Burrows M.D.   On: 07/01/2023 14:22   DG Abd Portable 1V Result Date: 07/01/2023 CLINICAL DATA:  [id number] Encounter for feeding tube placement [id number] EXAM: PORTABLE ABDOMEN - 1 VIEW COMPARISON:  June 28, 2023 FINDINGS: Weighted feeding tube crosses midline and may be coiled within the gastric antrum or the proximal duodenum. Nonobstructive bowel gas pattern. Aorta bi-iliac stent  graft. Cholecystectomy clips. Possible right-sided ostomy device IMPRESSION: Weighted feeding tube crosses midline and may be within the gastric antrum or the proximal duodenum. Continued advancement recommended for optimal post pyloric positioning. Electronically Signed   By: Rance Burrows M.D.   On: 07/01/2023 13:54    Anti-infectives: Anti-infectives (From admission, onward)    Start     Dose/Rate Route Frequency Ordered Stop   06/30/23 0800  DAPTOmycin  (CUBICIN ) IVPB 500 mg/48mL premix        8 mg/kg  57 kg 100 mL/hr over 30 Minutes Intravenous Daily 06/29/23 1146     06/30/23 0300  vancomycin  (VANCOREADY) IVPB 500 mg/100 mL  Status:  Discontinued        500 mg 100 mL/hr over 60 Minutes Intravenous Every 24 hours 06/29/23 0054 06/29/23 1146   06/29/23 0200  vancomycin  (VANCOCIN ) 1,250 mg in sodium chloride  0.9 % 250 mL IVPB        1,250 mg 175 mL/hr over 90 Minutes Intravenous  Once 06/29/23 0054 06/29/23 0336   06/28/23 0930  piperacillin -tazobactam (ZOSYN ) IVPB 3.375 g  Status:  Discontinued        3.375 g 12.5 mL/hr over 240 Minutes Intravenous Every 12 hours 06/28/23 0832 06/28/23 0834   06/28/23 0930  piperacillin -tazobactam (ZOSYN ) IVPB 3.375 g        3.375 g 12.5 mL/hr over 240 Minutes Intravenous Every 8 hours 06/28/23 0834     06/24/23 2200  piperacillin -tazobactam (ZOSYN ) IVPB 3.375 g        3.375 g 12.5 mL/hr over 240 Minutes Intravenous Every 12 hours 06/24/23 1013 06/27/23 1621   06/20/23 1000  piperacillin -tazobactam (ZOSYN ) IVPB 3.375 g  Status:  Discontinued        3.375 g 12.5 mL/hr over 240 Minutes Intravenous Every 8 hours 06/20/23 0903 06/24/23 1013        Assessment/Plan POD 12 s/p dx laparoscopy by Dr. 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  POD 4 S/p ex lap,  R colectomy, end ileostomy by Dr. 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  - Having ileostomy function and tolerated goal tube feeds. Advance oral diet per SLP. - BID WTD - WOCN consult - Remainder of care per primary team   LOS: 13 days    Edmon Gosling, MD [facility] 07/03/2023, 10:58 AM Please see Amion for pager number during day hours 7:00am-4:30pm

## 2023-07-04 LAB — CULTURE, BLOOD (ROUTINE X 2)
Culture: NO GROWTH
Culture: NO GROWTH
Culture: NO GROWTH
Culture: NO GROWTH
Special Requests: ADEQUATE
Special Requests: ADEQUATE
Special Requests: ADEQUATE

## 2023-07-06 LAB — CULTURE, BLOOD (ROUTINE X 2)
Culture: NO GROWTH
Culture: NO GROWTH
Special Requests: ADEQUATE

## 2023-07-14 DEATH — deceased

## 2023-07-27 ENCOUNTER — Ambulatory Visit: Admitting: Vascular Surgery

## 2023-11-23 ENCOUNTER — Ambulatory Visit: Admitting: Internal Medicine

## 2023-11-23 ENCOUNTER — Other Ambulatory Visit
